# Patient Record
Sex: Female | Born: 1950 | Race: White | Hispanic: No | Marital: Married | Smoking: Former smoker
Health system: Southern US, Community
[De-identification: ages and names within clinical notes are randomized; demographics above are authoritative.]

## PROBLEM LIST (undated history)

## (undated) DIAGNOSIS — M1990 Unspecified osteoarthritis, unspecified site: Secondary | ICD-10-CM

## (undated) DIAGNOSIS — M810 Age-related osteoporosis without current pathological fracture: Secondary | ICD-10-CM

## (undated) DIAGNOSIS — K635 Polyp of colon: Secondary | ICD-10-CM

## (undated) DIAGNOSIS — I499 Cardiac arrhythmia, unspecified: Secondary | ICD-10-CM

## (undated) DIAGNOSIS — J439 Emphysema, unspecified: Secondary | ICD-10-CM

## (undated) DIAGNOSIS — H269 Unspecified cataract: Secondary | ICD-10-CM

## (undated) DIAGNOSIS — C801 Malignant (primary) neoplasm, unspecified: Secondary | ICD-10-CM

## (undated) DIAGNOSIS — C3490 Malignant neoplasm of unspecified part of unspecified bronchus or lung: Secondary | ICD-10-CM

## (undated) DIAGNOSIS — I2510 Atherosclerotic heart disease of native coronary artery without angina pectoris: Secondary | ICD-10-CM

## (undated) DIAGNOSIS — I5033 Acute on chronic diastolic (congestive) heart failure: Secondary | ICD-10-CM

## (undated) DIAGNOSIS — J449 Chronic obstructive pulmonary disease, unspecified: Secondary | ICD-10-CM

## (undated) DIAGNOSIS — I4892 Unspecified atrial flutter: Secondary | ICD-10-CM

## (undated) DIAGNOSIS — F32A Depression, unspecified: Secondary | ICD-10-CM

## (undated) DIAGNOSIS — T4145XA Adverse effect of unspecified anesthetic, initial encounter: Secondary | ICD-10-CM

## (undated) DIAGNOSIS — N179 Acute kidney failure, unspecified: Secondary | ICD-10-CM

## (undated) DIAGNOSIS — E7800 Pure hypercholesterolemia, unspecified: Secondary | ICD-10-CM

## (undated) DIAGNOSIS — F329 Major depressive disorder, single episode, unspecified: Secondary | ICD-10-CM

## (undated) DIAGNOSIS — F419 Anxiety disorder, unspecified: Secondary | ICD-10-CM

## (undated) DIAGNOSIS — D649 Anemia, unspecified: Secondary | ICD-10-CM

## (undated) DIAGNOSIS — R0902 Hypoxemia: Secondary | ICD-10-CM

## (undated) DIAGNOSIS — T8859XA Other complications of anesthesia, initial encounter: Secondary | ICD-10-CM

## (undated) DIAGNOSIS — K219 Gastro-esophageal reflux disease without esophagitis: Secondary | ICD-10-CM

## (undated) HISTORY — PX: EYE SURGERY: SHX253

## (undated) HISTORY — PX: CORONARY ANGIOPLASTY WITH STENT PLACEMENT: SHX49

## (undated) HISTORY — DX: Chronic obstructive pulmonary disease, unspecified: J44.9

## (undated) HISTORY — DX: Polyp of colon: K63.5

## (undated) HISTORY — PX: BASAL CELL CARCINOMA EXCISION: SHX1214

## (undated) HISTORY — DX: Atherosclerotic heart disease of native coronary artery without angina pectoris: I25.10

## (undated) HISTORY — DX: Hypoxemia: R09.02

## (undated) HISTORY — DX: Gastro-esophageal reflux disease without esophagitis: K21.9

## (undated) HISTORY — PX: CARDIAC CATHETERIZATION: SHX172

## (undated) HISTORY — DX: Emphysema, unspecified: J43.9

## (undated) HISTORY — PX: CHOLECYSTECTOMY: SHX55

## (undated) HISTORY — PX: APPENDECTOMY: SHX54

## (undated) HISTORY — DX: Anxiety disorder, unspecified: F41.9

## (undated) HISTORY — DX: Pure hypercholesterolemia, unspecified: E78.00

## (undated) HISTORY — DX: Depression, unspecified: F32.A

## (undated) HISTORY — DX: Unspecified cataract: H26.9

## (undated) HISTORY — DX: Malignant (primary) neoplasm, unspecified: C80.1

## (undated) HISTORY — DX: Age-related osteoporosis without current pathological fracture: M81.0

## (undated) HISTORY — DX: Major depressive disorder, single episode, unspecified: F32.9

## (undated) HISTORY — DX: Anemia, unspecified: D64.9

## (undated) HISTORY — PX: CATARACT EXTRACTION: SUR2

---
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
## 2012-09-13 DIAGNOSIS — C44310 Basal cell carcinoma of skin of unspecified parts of face: Secondary | ICD-10-CM | POA: Insufficient documentation

## 2013-12-16 ENCOUNTER — Emergency Department (HOSPITAL_COMMUNITY)
Admission: EM | Admit: 2013-12-16 | Discharge: 2013-12-16 | Disposition: A | Discharge status: Home / Self Care | Payer: Medicare HMO | Attending: Emergency Medicine | Admitting: Emergency Medicine

## 2013-12-16 ENCOUNTER — Encounter (HOSPITAL_COMMUNITY): Payer: Self-pay | Admitting: Emergency Medicine

## 2013-12-16 ENCOUNTER — Emergency Department (HOSPITAL_COMMUNITY): Payer: Medicare HMO

## 2013-12-16 DIAGNOSIS — Z95818 Presence of other cardiac implants and grafts: Secondary | ICD-10-CM | POA: Insufficient documentation

## 2013-12-16 DIAGNOSIS — R05 Cough: Secondary | ICD-10-CM

## 2013-12-16 DIAGNOSIS — R5383 Other fatigue: Secondary | ICD-10-CM

## 2013-12-16 DIAGNOSIS — R5381 Other malaise: Secondary | ICD-10-CM | POA: Insufficient documentation

## 2013-12-16 DIAGNOSIS — Z9861 Coronary angioplasty status: Secondary | ICD-10-CM | POA: Insufficient documentation

## 2013-12-16 DIAGNOSIS — F17200 Nicotine dependence, unspecified, uncomplicated: Secondary | ICD-10-CM | POA: Insufficient documentation

## 2013-12-16 DIAGNOSIS — R059 Cough, unspecified: Secondary | ICD-10-CM

## 2013-12-16 DIAGNOSIS — J441 Chronic obstructive pulmonary disease with (acute) exacerbation: Secondary | ICD-10-CM | POA: Insufficient documentation

## 2013-12-16 DIAGNOSIS — R110 Nausea: Secondary | ICD-10-CM | POA: Insufficient documentation

## 2013-12-16 DIAGNOSIS — R519 Headache, unspecified: Secondary | ICD-10-CM

## 2013-12-16 DIAGNOSIS — R51 Headache: Secondary | ICD-10-CM | POA: Insufficient documentation

## 2013-12-16 DIAGNOSIS — Z79899 Other long term (current) drug therapy: Secondary | ICD-10-CM | POA: Insufficient documentation

## 2013-12-16 MED ORDER — ONDANSETRON HCL 8 MG PO TABS: 8.0000 mg | ORAL_TABLET | Freq: Three times a day (TID) | ORAL | Status: DC | PRN

## 2013-12-16 MED ORDER — ALBUTEROL SULFATE HFA 108 (90 BASE) MCG/ACT IN AERS
2.0000 | INHALATION_SPRAY | Freq: Once | RESPIRATORY_TRACT | Status: AC
  Administered 2013-12-16: 2 via RESPIRATORY_TRACT
  Filled 2013-12-16: qty 6.7

## 2013-12-16 MED ORDER — ONDANSETRON 8 MG PO TBDP
8.0000 mg | ORAL_TABLET | Freq: Once | ORAL | Status: AC
  Administered 2013-12-16: 8 mg via ORAL
  Filled 2013-12-16: qty 1

## 2013-12-16 MED ORDER — IBUPROFEN 400 MG PO TABS
600.0000 mg | ORAL_TABLET | Freq: Once | ORAL | Status: AC
  Administered 2013-12-16: 600 mg via ORAL
  Filled 2013-12-16: qty 2

## 2013-12-16 NOTE — ED Notes (Signed)
PT C/O HA/N/COUGH X 3 DAYS.

## 2013-12-16 NOTE — ED Provider Notes (Signed)
CSN: [id number]     Arrival date & time 12/16/13  4196 History  This chart was scribed for Tonya Cable, MD by Roxan Diesel, ED scribe.  This patient was seen in room APA12/APA12 and the patient's care was started at 9:07 AM.   Chief Complaint  Patient presents with  . Headache    Patient is a 63 y.o. female presenting with headaches. The history is provided by the patient. No language interpreter was used.  Headache Pain location:  Frontal Quality: pounding. Duration:  3 days Chronicity:  New Similar to prior headaches: no   Exacerbated by: coughing. Associated symptoms: cough, fatigue and nausea   Associated symptoms: no abdominal pain, no back pain, no congestion, no diarrhea, no fever, no focal weakness, no neck pain, no sinus pressure, no sore throat, no visual change and no vomiting     HPI Comments: Tonya Bradley is a 63 y.o. female with h/o COPD and heart stent who presents to the Emergency Department complaining of a "pounding" frontal headache that began 3 days ago with associated persistent cough, nausea, and fatigue.  Pt states she gets headaches frequently but her current headache is distinct.  It is worsened by coughing.  She has attempted to treat headache with ibuprofen.  She has not used any other medications pta.  She denies abdominal pain and states "it's just real bad nausea."  Pt also notes some chronic SOB but she denies recent changes.  She denies vomiting, sore throat, sinus pressure, congestion, postnasal drip, CP, diarrhea, rash, fevers, hemoptysis, abdominal pain, visual changes, focal weakness, or pain to any other area.  She denies syncope or recent head injury.   PMH - COPD Sox hx - smoker  Past Surgical History  Procedure Laterality Date  . Cardiac catheterization    . Cholecystectomy      No family history on file.   History  Substance Use Topics  . Smoking status: Current Every Day Smoker  . Smokeless tobacco: Not on file  . Alcohol  Use: No    OB History   Grav Para Term Preterm Abortions TAB SAB Ect Mult Living                  Review of Systems  Constitutional: Positive for fatigue. Negative for fever.  HENT: Negative for congestion, sinus pressure and sore throat.   Respiratory: Positive for cough.   Gastrointestinal: Positive for nausea. Negative for vomiting, abdominal pain and diarrhea.  Musculoskeletal: Negative for back pain and neck pain.  Neurological: Positive for headaches. Negative for focal weakness.  All other systems reviewed and are negative.     Allergies  Review of patient's allergies indicates no known allergies.  Home Medications   Current Outpatient Rx  Name  Route  Sig  Dispense  Refill  . hydrochlorothiazide (HYDRODIURIL) 25 MG tablet   Oral   Take 25 mg by mouth daily.         . simvastatin (ZOCOR) 40 MG tablet   Oral   Take 40 mg by mouth daily.         Marland Kitchen ALPRAZolam (XANAX) 1 MG tablet   Oral   Take 1 tablet by mouth 3 (three) times daily.         . Vitamin D, Ergocalciferol, (DRISDOL) 50000 UNITS CAPS capsule   Oral   Take 2 capsules by mouth 2 (two) times a week.          BP 116/61  Pulse 100  Temp(Src) 99.1 F (37.3 C)  Resp 18  Ht 5\' 2"  (1.575 m)  Wt 139 lb (63.05 kg)  BMI 25.42 kg/m2  SpO2 92%   Physical Exam CONSTITUTIONAL: Well developed/well nourished HEAD: Normocephalic/atraumatic EYES: EOMI/PERRL, no nystagmus ENMT: Mucous membranes moist NECK: supple no meningeal signs, no bruits SPINE:entire spine nontender CV: S1/S2 noted, no murmurs/rubs/gallops noted LUNGS: Lungs are clear to auscultation bilaterally, no apparent distress ABDOMEN: soft, nontender, no rebound or guarding GU:no cva tenderness NEURO:Awake/alert, facies symmetric, no arm or leg drift is noted Cranial nerves 3/4/5/6/06/09/09/11/12 tested and intact Gait normal without ataxia No past pointing EXTREMITIES: pulses normal, full ROM SKIN: warm, color normal PSYCH: no  abnormalities of mood noted    ED Course  Procedures (including critical care time)  DIAGNOSTIC STUDIES: Oxygen Saturation is 92% on room air, low by my interpretation.    COORDINATION OF CARE: 9:15 AM-Discussed treatment plan which includes breathing treatment, EKG, anti-emetics and ibuprofen with pt at bedside and pt agreed to plan.    9:54 AM Pt well appearing, no distress Reports cough and associated HA She has no focal neuro deficits No signs of meningitis No active CP.  She reports chronic SOB due to COPD but not worsened today She has dry cough while I was in room.  Suspect viral syndrome She was mostly concerned with her nausea 10:00 AM Repeat exam reveals crackles right base but otherwise no distress, well appearing Will get CXR Pt does not want labs done as she just had them last week by PCP and were normal 10:30 AM CXR shows no acute process Her pulse ox is 93-94%, she reports she is on home O2 at night I doubt ACS/PE at this time I doubt an acute neurologic process at this time   Labs Review Labs Reviewed - No data to display   Imaging Review Dg Chest 2 View  12/16/2013   CLINICAL DATA:  Cough, congestion  EXAM: CHEST  2 VIEW  COMPARISON:  None.  FINDINGS: Cardiac and mediastinal contours are within normal limits. Atherosclerotic calcification noted in the transverse aorta. The patient is slightly rotated to the right. The manubrium cast a shadow over the medial right upper lung in the region of the first rib costo manubrial junction. Background bronchitic changes and bilateral upper lung emphysema. Mildly prominent interstitial markings in the bases. The lungs are hyperexpanded No acute osseous abnormality. No suspicious pulmonary nodule. A nodular opacity in the right mid lung interposed between the seventh and eighth ribs is favored to reflect a vascular shadow.  IMPRESSION: 1. COPD/emphysema. 2. Aortic atherosclerosis.   Electronically Signed   By: Jacqulynn Cadet M.D.   On: 12/16/2013 10:23    EKG Interpretation    Date/Time:  Thursday December 16 2013 09:38:16 EST Ventricular Rate:  96 PR Interval:  192 QRS Duration: 92 QT Interval:  366 QTC Calculation: 462 R Axis:   -86 Text Interpretation:  Normal sinus rhythm Left axis deviation Incomplete right bundle branch block Nonspecific ST and T wave abnormality Abnormal ECG No previous ECGs available Confirmed by Christy Gentles  MD, Riata Ikeda ([id number]) on 12/16/2013 9:50:14 AM            MDM  No diagnosis found. Nursing notes including past medical history and social history reviewed and considered in documentation xrays reviewed and considered     I personally performed the services described in this documentation, which was scribed in my presence. The recorded information has been reviewed and is accurate.  Tonya Cable, MD 12/16/13 1031

## 2013-12-16 NOTE — Discharge Instructions (Signed)

## 2014-06-07 ENCOUNTER — Ambulatory Visit (INDEPENDENT_AMBULATORY_CARE_PROVIDER_SITE_OTHER): Payer: Medicare HMO | Admitting: Gastroenterology

## 2014-06-07 ENCOUNTER — Encounter (INDEPENDENT_AMBULATORY_CARE_PROVIDER_SITE_OTHER): Payer: Self-pay

## 2014-06-07 ENCOUNTER — Encounter: Payer: Self-pay | Admitting: Gastroenterology

## 2014-06-07 VITALS — BP 118/72 | HR 90 | Temp 98.0°F | Resp 18 | Ht 62.0 in | Wt 125.0 lb

## 2014-06-07 DIAGNOSIS — R195 Other fecal abnormalities: Secondary | ICD-10-CM

## 2014-06-07 MED ORDER — PEG 3350-KCL-NA BICARB-NACL 420 G PO SOLR: 4000.0000 mL | ORAL | Status: DC

## 2014-06-07 NOTE — Patient Instructions (Signed)
Start taking a probiotic daily. We have provided Align samples.  Please complete the stool samples.  We have scheduled you for a colonoscopy with Dr. Gala Romney in the near future!

## 2014-06-07 NOTE — Progress Notes (Signed)
Primary Care Physician:  Jearld Lesch, DO Primary Gastroenterologist:  Dr. Gala Romney  Chief Complaint  Patient presents with  . Establish Care    HPI:   Tonya Bradley presents today at the request of Carolee Rota, NP/Dr/ Jearld Lesch secondary to diarrhea. Last colonoscopy in 2002 at [facility]. Reported polyps. Operative notes not available at time of appointment. Has hadBM 4 times this morning but not diarrhea. Postprandial urgency occasionally. Onset of loose stools about 2-3 months ago. Prior to this, dealt with constipation and actually had to take a laxative to go. Has been going 7-8 times per day. Used to be a foul odor but has improved some this week. Feels better this week. No rectal bleeding. Stomach cramps. Since last 07-23-2023 lost from 165 to 125. Celesta Gentile passed away last 2023-07-23 from a brain tumor. Doesn't cook, thinks may be related to weight loss.   Antibiotic exposure about 4-5 months ago. Well water. No sick contacts.   Past Medical History  Diagnosis Date  . Colon polyp   . Anxiety   . Hypercholesterolemia   . CAD (coronary artery disease)   . COPD (chronic obstructive pulmonary disease)     Past Surgical History  Procedure Laterality Date  . Cardiac catheterization    . Cholecystectomy    . Basal cell carcinoma excision    . Coronary angioplasty with stent placement      Current Outpatient Prescriptions  Medication Sig Dispense Refill  . ALPRAZolam (XANAX) 1 MG tablet Take 1 tablet by mouth 3 (three) times daily.      . hydrochlorothiazide (HYDRODIURIL) 25 MG tablet Take 25 mg by mouth daily.      Marland Kitchen ibuprofen (ADVIL,MOTRIN) 200 MG tablet Take 400-600 mg by mouth every 6 (six) hours as needed for fever, headache or moderate pain.      . simvastatin (ZOCOR) 40 MG tablet Take 40 mg by mouth daily.      . Vitamin D, Ergocalciferol, (DRISDOL) 50000 UNITS CAPS capsule Take 2 capsules by mouth 2 (two) times a week. Takes on Tuesdays and Fridays.       No current  facility-administered medications for this visit.    Allergies as of 06/07/2014  . (No Known Allergies)    Family History  Problem Relation Age of Onset  . Colon cancer Neg Hx   . Breast cancer Sister     History   Social History  . Marital Status: Widowed    Spouse Name: N/A    Number of Children: N/A  . Years of Education: N/A   Occupational History  . Not on file.   Social History Main Topics  . Smoking status: Current Every Day Smoker -- 2.00 packs/day    Types: Cigarettes  . Smokeless tobacco: Not on file  . Alcohol Use: No  . Drug Use: No  . Sexual Activity: Not on file   Other Topics Concern  . Not on file   Social History Narrative  . No narrative on file    Review of Systems: Gen: see HPI CV: Denies chest pain, heart palpitations, peripheral edema, syncope.  Resp: +cough, wheezing GI: see HPI GU : Denies urinary burning, urinary frequency, urinary hesitancy MS: neck pain "catches" when turning a certain way Derm: Denies rash, itching, dry skin Psych: +depression, anxiety Heme: Denies bruising, bleeding, and enlarged lymph nodes.  Physical Exam: BP 118/72  Pulse 90  Temp(Src) 98 F (36.7 C) (Oral)  Resp 18  Ht 5\' 2"  (  1.575 m)  Wt 125 lb (56.7 kg)  BMI 22.86 kg/m2 General:   Alert and oriented. Pleasant and cooperative. Well-nourished and well-developed.  Head:  Normocephalic and atraumatic. Eyes:  Without icterus, sclera clear and conjunctiva pink.  Ears:  Normal auditory acuity. Nose:  No deformity, discharge,  or lesions. Mouth:  No deformity or lesions, oral mucosa pink.  Lungs:  Mild anterior wheeze  Heart:  S1, S2 present without murmurs appreciated.  Abdomen:  +BS, soft, non-tender and non-distended. No HSM noted. No guarding or rebound. No masses appreciated.  Rectal:  Deferred  Msk:  Symmetrical without gross deformities. Normal posture. Neurologic:  Alert and  oriented x4;  grossly normal neurologically. Skin:  Intact without  significant lesions or rashes. Cervical Nodes:  No significant cervical adenopathy. Psych:  Alert and cooperative. Normal mood and affect.

## 2014-06-08 DIAGNOSIS — R195 Other fecal abnormalities: Secondary | ICD-10-CM | POA: Insufficient documentation

## 2014-06-08 NOTE — Assessment & Plan Note (Signed)
63 year old female with new onset loose stool for several months, markedly changed from her baseline of constipation, with last colonoscopy in 2002 at an outside facility. Possibility of polyps noted per patient; op notes not available. Exposure to antibiotics noted several months prior. Unable to rule out Cdiff. Weight loss likely multifactorial with decreased eating and grieving process due to death of husband and then fiancee.   Check stool studies now Probiotic daily Proceed with TCS with Dr. Gala Romney in near future: the risks, benefits, and alternatives have been discussed with the patient in detail. The patient states understanding and desires to proceed.

## 2014-06-09 LAB — GIARDIA ANTIGEN: Giardia Screen (EIA): NEGATIVE

## 2014-06-09 LAB — CLOSTRIDIUM DIFFICILE BY PCR: Toxigenic C. Difficile by PCR: NOT DETECTED

## 2014-06-12 LAB — STOOL CULTURE

## 2014-06-20 ENCOUNTER — Encounter (HOSPITAL_COMMUNITY): Payer: Self-pay | Admitting: Pharmacy Technician

## 2014-06-21 NOTE — Progress Notes (Signed)
Quick Note:  Negative stool studies. Proceed with colonoscopy as planned. ______

## 2014-06-23 ENCOUNTER — Ambulatory Visit (HOSPITAL_COMMUNITY)
Admission: RE | Admit: 2014-06-23 | Discharge: 2014-06-23 | Disposition: A | Discharge status: Home / Self Care | Payer: Medicare HMO | Source: Ambulatory Visit | Attending: Internal Medicine | Admitting: Internal Medicine

## 2014-06-23 ENCOUNTER — Encounter (HOSPITAL_COMMUNITY): Payer: Self-pay

## 2014-06-23 ENCOUNTER — Encounter (HOSPITAL_COMMUNITY)
Admission: RE | Disposition: A | Discharge status: Home / Self Care | Payer: Self-pay | Source: Ambulatory Visit | Attending: Internal Medicine

## 2014-06-23 DIAGNOSIS — J4489 Other specified chronic obstructive pulmonary disease: Secondary | ICD-10-CM | POA: Insufficient documentation

## 2014-06-23 DIAGNOSIS — R197 Diarrhea, unspecified: Secondary | ICD-10-CM | POA: Insufficient documentation

## 2014-06-23 DIAGNOSIS — Z803 Family history of malignant neoplasm of breast: Secondary | ICD-10-CM | POA: Insufficient documentation

## 2014-06-23 DIAGNOSIS — D126 Benign neoplasm of colon, unspecified: Secondary | ICD-10-CM | POA: Insufficient documentation

## 2014-06-23 DIAGNOSIS — K5289 Other specified noninfective gastroenteritis and colitis: Secondary | ICD-10-CM | POA: Diagnosis not present

## 2014-06-23 DIAGNOSIS — F411 Generalized anxiety disorder: Secondary | ICD-10-CM | POA: Diagnosis not present

## 2014-06-23 DIAGNOSIS — K620 Anal polyp: Secondary | ICD-10-CM | POA: Insufficient documentation

## 2014-06-23 DIAGNOSIS — K5730 Diverticulosis of large intestine without perforation or abscess without bleeding: Secondary | ICD-10-CM | POA: Insufficient documentation

## 2014-06-23 DIAGNOSIS — J449 Chronic obstructive pulmonary disease, unspecified: Secondary | ICD-10-CM | POA: Diagnosis not present

## 2014-06-23 DIAGNOSIS — Z79899 Other long term (current) drug therapy: Secondary | ICD-10-CM | POA: Diagnosis not present

## 2014-06-23 DIAGNOSIS — E7800 Pure hypercholesterolemia, unspecified: Secondary | ICD-10-CM | POA: Diagnosis not present

## 2014-06-23 DIAGNOSIS — R195 Other fecal abnormalities: Secondary | ICD-10-CM

## 2014-06-23 DIAGNOSIS — F17200 Nicotine dependence, unspecified, uncomplicated: Secondary | ICD-10-CM | POA: Insufficient documentation

## 2014-06-23 DIAGNOSIS — K621 Rectal polyp: Secondary | ICD-10-CM

## 2014-06-23 DIAGNOSIS — I2510 Atherosclerotic heart disease of native coronary artery without angina pectoris: Secondary | ICD-10-CM | POA: Diagnosis not present

## 2014-06-23 HISTORY — PX: COLONOSCOPY: SHX5424

## 2014-06-23 SURGERY — COLONOSCOPY
Anesthesia: Moderate Sedation

## 2014-06-23 MED ORDER — ONDANSETRON HCL 4 MG/2ML IJ SOLN
INTRAMUSCULAR | Status: AC
  Filled 2014-06-23: qty 2

## 2014-06-23 MED ORDER — MEPERIDINE HCL 100 MG/ML IJ SOLN
INTRAMUSCULAR | Status: DC | PRN
  Administered 2014-06-23 (×2): 50 mg via INTRAVENOUS
  Administered 2014-06-23 (×2): 25 mg via INTRAVENOUS

## 2014-06-23 MED ORDER — ONDANSETRON HCL 4 MG/2ML IJ SOLN
INTRAMUSCULAR | Status: DC | PRN
  Administered 2014-06-23: 4 mg via INTRAVENOUS

## 2014-06-23 MED ORDER — MIDAZOLAM HCL 5 MG/5ML IJ SOLN
INTRAMUSCULAR | Status: AC
  Filled 2014-06-23: qty 10

## 2014-06-23 MED ORDER — SODIUM CHLORIDE 0.9 % IV SOLN
INTRAVENOUS | Status: DC
  Administered 2014-06-23: 11:00:00 via INTRAVENOUS

## 2014-06-23 MED ORDER — MEPERIDINE HCL 100 MG/ML IJ SOLN
INTRAMUSCULAR | Status: AC
  Filled 2014-06-23: qty 2

## 2014-06-23 MED ORDER — MIDAZOLAM HCL 5 MG/5ML IJ SOLN
INTRAMUSCULAR | Status: DC | PRN
  Administered 2014-06-23: 1 mg via INTRAVENOUS
  Administered 2014-06-23 (×3): 2 mg via INTRAVENOUS
  Administered 2014-06-23: 1 mg via INTRAVENOUS

## 2014-06-23 MED ORDER — STERILE WATER FOR IRRIGATION IR SOLN
Status: DC | PRN
  Administered 2014-06-23: 11:00:00

## 2014-06-23 NOTE — Op Note (Signed)
[facility] [address] [place], [address]   COLONOSCOPY PROCEDURE REPORT  PATIENT: Tonya Bradley, Tonya Bradley  MR#:         [id number] BIRTHDATE: 1951-10-19 , 73  yrs. old GENDER: Female ENDOSCOPIST: R.  Garfield Cornea, MD FACP [facility] REFERRED BY:     Jearld Lesch PROCEDURE DATE:  06/23/2014 PROCEDURE:     Ileocolonoscopy with segmental biopsy  INDICATIONS: chronic diarrhea  INFORMED CONSENT:  The risks, benefits, alternatives and imponderables including but not limited to bleeding, perforation as well as the possibility of a missed lesion have been reviewed.  The potential for biopsy, lesion removal, etc. have also been discussed.  Questions have been answered.  All parties agreeable. Please see the history and physical in the medical record for more information.  MEDICATIONS: Versed 8 mg IV and Demerol 150 mg IV in divided doses. Zofran 4 mg IV.  DESCRIPTION OF PROCEDURE:  After a digital rectal exam was performed, the EC-[id number] ([id number])  colonoscope was advanced from the anus through the rectum and colon to the area of the cecum, ileocecal valve and appendiceal orifice.  The cecum was deeply intubated.  These structures were well-seen and photographed for the record.  From the level of the cecum and ileocecal valve, the scope was slowly and cautiously withdrawn.  The mucosal surfaces were carefully surveyed utilizing scope tip deflection to facilitate fold flattening as needed.  The scope was pulled down into the rectum where a thorough examination including retroflexion was performed.    FINDINGS:  Adequate preparation.  (1) diminutive polyp in the distal rectum. Some furrowing of the rectal mucosa present.  These sutle changes extended up into the sigmoid segment and taper off more proximally to the cecum. Patient scattered pancolonic diverticula ;the patient (1) diminutive polyp in the descending segment; otherwise, the remainder of colonic mucosa appeared  normal. The distal 5 cm of terminal ileal mucosa also appeared normal.  THERAPEUTIC / DIAGNOSTIC MANEUVERS PERFORMED: Segmental biopsies of the ascending and descending/sigmoid segments taken for histologic study. The rectal and descending colon polyps removed: Biopsy technique.  COMPLICATIONS: none  CECAL WITHDRAWAL TIME:  20 minutes  IMPRESSION:  Rectal and colonic polyps-removed as described above. Pancolonic diverticulosis. status post sacral biopsy.  RECOMMENDATIONS: Followup on pathology.   _______________________________ eSigned:  R. Garfield Cornea, MD FACP [facility] 06/23/2014 12:00 PM   CC:    PATIENT NAME:  Tonya Bradley, Tonya Bradley MR#: [id number]

## 2014-06-23 NOTE — H&P (View-Only) (Signed)
Primary Care Physician:  Jearld Lesch, DO Primary Gastroenterologist:  Dr. Gala Romney  Chief Complaint  Patient presents with  . Establish Care    HPI:   Tonya Bradley presents today at the request of Carolee Rota, NP/Dr/ Jearld Lesch secondary to diarrhea. Last colonoscopy in 2002 at [place]. Reported polyps. Operative notes not available at time of appointment. Has hadBM 4 times this morning but not diarrhea. Postprandial urgency occasionally. Onset of loose stools about 2-3 months ago. Prior to this, dealt with constipation and actually had to take a laxative to go. Has been going 7-8 times per day. Used to be a foul odor but has improved some this week. Feels better this week. No rectal bleeding. Stomach cramps. Since last 08/06/23 lost from 165 to 125. Celesta Gentile passed away last 08/06/2023 from a brain tumor. Doesn't cook, thinks may be related to weight loss.   Antibiotic exposure about 4-5 months ago. Well water. No sick contacts.   Past Medical History  Diagnosis Date  . Colon polyp   . Anxiety   . Hypercholesterolemia   . CAD (coronary artery disease)   . COPD (chronic obstructive pulmonary disease)     Past Surgical History  Procedure Laterality Date  . Cardiac catheterization    . Cholecystectomy    . Basal cell carcinoma excision    . Coronary angioplasty with stent placement      Current Outpatient Prescriptions  Medication Sig Dispense Refill  . ALPRAZolam (XANAX) 1 MG tablet Take 1 tablet by mouth 3 (three) times daily.      . hydrochlorothiazide (HYDRODIURIL) 25 MG tablet Take 25 mg by mouth daily.      Marland Kitchen ibuprofen (ADVIL,MOTRIN) 200 MG tablet Take 400-600 mg by mouth every 6 (six) hours as needed for fever, headache or moderate pain.      . simvastatin (ZOCOR) 40 MG tablet Take 40 mg by mouth daily.      . Vitamin D, Ergocalciferol, (DRISDOL) 50000 UNITS CAPS capsule Take 2 capsules by mouth 2 (two) times a week. Takes on Tuesdays and Fridays.       No current  facility-administered medications for this visit.    Allergies as of 06/07/2014  . (No Known Allergies)    Family History  Problem Relation Age of Onset  . Colon cancer Neg Hx   . Breast cancer Sister     History   Social History  . Marital Status: Widowed    Spouse Name: N/A    Number of Children: N/A  . Years of Education: N/A   Occupational History  . Not on file.   Social History Main Topics  . Smoking status: Current Every Day Smoker -- 2.00 packs/day    Types: Cigarettes  . Smokeless tobacco: Not on file  . Alcohol Use: No  . Drug Use: No  . Sexual Activity: Not on file   Other Topics Concern  . Not on file   Social History Narrative  . No narrative on file    Review of Systems: Gen: see HPI CV: Denies chest pain, heart palpitations, peripheral edema, syncope.  Resp: +cough, wheezing GI: see HPI GU : Denies urinary burning, urinary frequency, urinary hesitancy MS: neck pain "catches" when turning a certain way Derm: Denies rash, itching, dry skin Psych: +depression, anxiety Heme: Denies bruising, bleeding, and enlarged lymph nodes.  Physical Exam: BP 118/72  Pulse 90  Temp(Src) 98 F (36.7 C) (Oral)  Resp 18  Ht 5\' 2"  (  1.575 m)  Wt 125 lb (56.7 kg)  BMI 22.86 kg/m2 General:   Alert and oriented. Pleasant and cooperative. Well-nourished and well-developed.  Head:  Normocephalic and atraumatic. Eyes:  Without icterus, sclera clear and conjunctiva pink.  Ears:  Normal auditory acuity. Nose:  No deformity, discharge,  or lesions. Mouth:  No deformity or lesions, oral mucosa pink.  Lungs:  Mild anterior wheeze  Heart:  S1, S2 present without murmurs appreciated.  Abdomen:  +BS, soft, non-tender and non-distended. No HSM noted. No guarding or rebound. No masses appreciated.  Rectal:  Deferred  Msk:  Symmetrical without gross deformities. Normal posture. Neurologic:  Alert and  oriented x4;  grossly normal neurologically. Skin:  Intact without  significant lesions or rashes. Cervical Nodes:  No significant cervical adenopathy. Psych:  Alert and cooperative. Normal mood and affect.

## 2014-06-23 NOTE — Interval H&P Note (Signed)
History and Physical Interval Note:  06/23/2014 11:13 AM  Tonya Bradley  has presented today for surgery, with the diagnosis of DIARRHEA  The various methods of treatment have been discussed with the patient and family. After consideration of risks, benefits and other options for treatment, the patient has consented to  Procedure(s) with comments: COLONOSCOPY (N/A) - 10:00-moved to 1115 Tonya Bradley to notify pt as a surgical intervention .  The patient's history has been reviewed, patient examined, no change in status, stable for surgery.  I have reviewed the patient's chart and labs.  Questions were answered to the patient's satisfaction.     Tonya Bradley  No change. Colonoscopy per plan. The risks, benefits, limitations, alternatives and imponderables have been reviewed with the patient. Questions have been answered. All parties are agreeable.

## 2014-06-23 NOTE — Discharge Instructions (Addendum)
Colonoscopy Discharge Instructions  Read the instructions outlined below and refer to this sheet in the next few weeks. These discharge instructions provide you with general information on caring for yourself after you leave the hospital. Your doctor may also give you specific instructions. While your treatment has been planned according to the most current medical practices available, unavoidable complications occasionally occur. If you have any problems or questions after discharge, call Dr. Gala Romney at [phone]. ACTIVITY  You may resume your regular activity, but move at a slower pace for the next 24 hours.   Take frequent rest periods for the next 24 hours.   Walking will help get rid of the air and reduce the bloated feeling in your belly (abdomen).   No driving for 24 hours (because of the medicine (anesthesia) used during the test).    Do not sign any important legal documents or operate any machinery for 24 hours (because of the anesthesia used during the test).  NUTRITION  Drink plenty of fluids.   You may resume your normal diet as instructed by your doctor.   Begin with a light meal and progress to your normal diet. Heavy or fried foods are harder to digest and may make you feel sick to your stomach (nauseated).   Avoid alcoholic beverages for 24 hours or as instructed.  MEDICATIONS  You may resume your normal medications unless your doctor tells you otherwise.  WHAT YOU CAN EXPECT TODAY  Some feelings of bloating in the abdomen.   Passage of more gas than usual.   Spotting of blood in your stool or on the toilet paper.  IF YOU HAD POLYPS REMOVED DURING THE COLONOSCOPY:  No aspirin products for 7 days or as instructed.   No alcohol for 7 days or as instructed.   Eat a soft diet for the next 24 hours.  FINDING OUT THE RESULTS OF YOUR TEST Not all test results are available during your visit. If your test results are not back during the visit, make an appointment  with your caregiver to find out the results. Do not assume everything is normal if you have not heard from your caregiver or the medical facility. It is important for you to follow up on all of your test results.  SEEK IMMEDIATE MEDICAL ATTENTION IF:  You have more than a spotting of blood in your stool.   Your belly is swollen (abdominal distention).   You are nauseated or vomiting.   You have a temperature over 101.   You have abdominal pain or discomfort that is severe or gets worse throughout the day.    Diverticulosis and polyp information provided  Further recommendations to follow pending review of pathology  Diverticulosis Diverticulosis is the condition that develops when small pouches (diverticula) form in the wall of your colon. Your colon, or large intestine, is where water is absorbed and stool is formed. The pouches form when the inside layer of your colon pushes through weak spots in the outer layers of your colon. CAUSES  No one knows exactly what causes diverticulosis. RISK FACTORS  Being older than 33. Your risk for this condition increases with age. Diverticulosis is rare in people younger than 40 years. By age 41, almost everyone has it.  Eating a low-fiber diet.  Being frequently constipated.  Being overweight.  Not getting enough exercise.  Smoking.  Taking over-the-counter pain medicines, like aspirin and ibuprofen. SYMPTOMS  Most people with diverticulosis do not have symptoms. DIAGNOSIS  Because diverticulosis  often has no symptoms, health care providers often discover the condition during an exam for other colon problems. In many cases, a health care provider will diagnose diverticulosis while using a flexible scope to examine the colon (colonoscopy). TREATMENT  If you have never developed an infection related to diverticulosis, you may not need treatment. If you have had an infection before, treatment may include:  Eating more fruits, vegetables,  and grains.  Taking a fiber supplement.  Taking a live bacteria supplement (probiotic).  Taking medicine to relax your colon. HOME CARE INSTRUCTIONS   Drink at least 6-8 glasses of water each day to prevent constipation.  Try not to strain when you have a bowel movement.  Keep all follow-up appointments. If you have had an infection before:  Increase the fiber in your diet as directed by your health care provider or dietitian.  Take a dietary fiber supplement if your health care provider approves.  Only take medicines as directed by your health care provider. SEEK MEDICAL CARE IF:   You have abdominal pain.  You have bloating.  You have cramps.  You have not gone to the bathroom in 3 days. SEEK IMMEDIATE MEDICAL CARE IF:   Your pain gets worse.  Yourbloating becomes very bad.  You have a fever or chills, and your symptoms suddenly get worse.  You begin vomiting.  You have bowel movements that are bloody or black. MAKE SURE YOU:  Understand these instructions.  Will watch your condition.  Will get help right away if you are not doing well or get worse. Document Released: 08/15/2004 Document Revised: 11/23/2013 Document Reviewed: 10/13/2013 [facility] Patient Information 2015 [place], [place]. This information is not intended to replace advice given to you by your health care provider. Make sure you discuss any questions you have with your health care provider.  Colon Polyps Polyps are lumps of extra tissue growing inside the body. Polyps can grow in the large intestine (colon). Most colon polyps are noncancerous (benign). However, some colon polyps can become cancerous over time. Polyps that are larger than a pea may be harmful. To be safe, caregivers remove and test all polyps. CAUSES  Polyps form when mutations in the genes cause your cells to grow and divide even though no more tissue is needed. RISK FACTORS There are a number of risk factors that can increase  your chances of getting colon polyps. They include:  Being older than 50 years.  Family history of colon polyps or colon cancer.  Long-term colon diseases, such as colitis or Crohn disease.  Being overweight.  Smoking.  Being inactive.  Drinking too much alcohol. SYMPTOMS  Most small polyps do not cause symptoms. If symptoms are present, they may include:  Blood in the stool. The stool may look dark red or black.  Constipation or diarrhea that lasts longer than 1 week. DIAGNOSIS People often do not know they have polyps until their caregiver finds them during a regular checkup. Your caregiver can use 4 tests to check for polyps:  Digital rectal exam. The caregiver wears gloves and feels inside the rectum. This test would find polyps only in the rectum.  Barium enema. The caregiver puts a liquid called barium into your rectum before taking X-rays of your colon. Barium makes your colon look white. Polyps are dark, so they are easy to see in the X-ray pictures.  Sigmoidoscopy. A thin, flexible tube (sigmoidoscope) is placed into your rectum. The sigmoidoscope has a light and tiny camera in it.  The caregiver uses the sigmoidoscope to look at the last third of your colon.  Colonoscopy. This test is like sigmoidoscopy, but the caregiver looks at the entire colon. This is the most common method for finding and removing polyps. TREATMENT  Any polyps will be removed during a sigmoidoscopy or colonoscopy. The polyps are then tested for cancer. PREVENTION  To help lower your risk of getting more colon polyps:  Eat plenty of fruits and vegetables. Avoid eating fatty foods.  Do not smoke.  Avoid drinking alcohol.  Exercise every day.  Lose weight if recommended by your caregiver.  Eat plenty of calcium and folate. Foods that are rich in calcium include milk, cheese, and broccoli. Foods that are rich in folate include chickpeas, kidney beans, and spinach. HOME CARE INSTRUCTIONS Keep  all follow-up appointments as directed by your caregiver. You may need periodic exams to check for polyps. SEEK MEDICAL CARE IF: You notice bleeding during a bowel movement. Document Released: 08/14/2004 Document Revised: 02/10/2012 Document Reviewed: 01/28/2012 [facility] Patient Information 2015 [place], [place]. This information is not intended to replace advice given to you by your health care provider. Make sure you discuss any questions you have with your health care provider.

## 2014-06-24 ENCOUNTER — Encounter: Payer: Self-pay | Admitting: Internal Medicine

## 2014-06-24 ENCOUNTER — Encounter (HOSPITAL_COMMUNITY): Payer: Self-pay | Admitting: Internal Medicine

## 2014-06-28 ENCOUNTER — Telehealth: Payer: Self-pay

## 2014-06-28 NOTE — Telephone Encounter (Signed)
Letter from: Daneil Dolin    Reason for Letter: Results Review    Send letter to patient.  Send copy of letter with path to referring provider and PCP.   Pt needs entocort 9 mg daily x 6 weeks w OV w AS in 6 weeks

## 2014-06-30 MED ORDER — BUDESONIDE 3 MG PO CP24: 9.0000 mg | ORAL_CAPSULE | Freq: Every day | ORAL | Status: DC

## 2014-06-30 NOTE — Telephone Encounter (Signed)
Pt is aware, rx sent to the pharmacy. Per AS ok to send in 30 days worth with 1 refill.  Manuela Schwartz, please schedule ov in 6 weeks.

## 2014-07-01 NOTE — Telephone Encounter (Signed)
We could try Uceris 9mg  daily, #30, 1 refill.   If not covered or too expensive, then mesalamine 2.4g daily (Asacol or HD, Delzicol, Lialda) whatever is on formulary.

## 2014-07-01 NOTE — Telephone Encounter (Signed)
Pt called- her copay for the entocort is $432.00. I tried to check her formulary online and could not find anything, also, I called [employer] and was transferred to 4 different departments and after being on the phone for over 15 minutes, we were disconnected. I am not sure what is on her formulary.  Magda Paganini, is there anything else we can send in for this pt?

## 2014-07-01 NOTE — Telephone Encounter (Signed)
Spoke with Tripp at [facility]. Uceris is $1500.00. Lialda is covered and $45 a month. I spoke with pt and she is aware.

## 2014-07-04 ENCOUNTER — Encounter: Payer: Self-pay | Admitting: Internal Medicine

## 2014-07-04 NOTE — Telephone Encounter (Signed)
Pt is aware of OV on 9/14 at 1030 and appt card mailed

## 2014-08-15 ENCOUNTER — Encounter: Payer: Self-pay | Admitting: Gastroenterology

## 2014-08-15 ENCOUNTER — Ambulatory Visit (INDEPENDENT_AMBULATORY_CARE_PROVIDER_SITE_OTHER): Payer: Medicare HMO | Admitting: Gastroenterology

## 2014-08-15 VITALS — BP 106/70 | HR 92 | Temp 97.6°F | Ht 62.0 in | Wt 119.0 lb

## 2014-08-15 DIAGNOSIS — K52839 Microscopic colitis, unspecified: Secondary | ICD-10-CM

## 2014-08-15 DIAGNOSIS — R634 Abnormal weight loss: Secondary | ICD-10-CM

## 2014-08-15 DIAGNOSIS — K5289 Other specified noninfective gastroenteritis and colitis: Secondary | ICD-10-CM

## 2014-08-15 MED ORDER — BISMUTH SUBSALICYLATE 262 MG PO CHEW: 786.0000 mg | CHEWABLE_TABLET | Freq: Three times a day (TID) | ORAL | Status: DC

## 2014-08-15 NOTE — Progress Notes (Signed)
Referring Provider: Jearld Lesch, DO Primary Care Physician:  Jearld Lesch, DO Primary GI: Dr. Gala Romney   Chief Complaint  Patient presents with  . Follow-up  . Diarrhea    HPI:   Tonya Bradley presents today in follow-up after colonoscopy in July 2015 revealing lymphocytic colitis. Work-up prior to colonoscopy with negative stool studies. She was unable to afford Entocort or Uceris. Therefore, she was prescribed Lialda. She completed this and actually ran out several days ago. However, she noted no improvement in her symptoms. Continues with loose stools 4-5 times per day. Postprandial urgency. Occasional cramping. Good appetite but continues to lose weight. Last August reports weighing around 165. Presentation to [place] in July 2015 was 125. Today 119. UNINTENTIONAL WEIGHT LOSS. Now reporting clay-colored stool. Significant stress.    Past Medical History  Diagnosis Date  . Colon polyp   . Anxiety   . Hypercholesterolemia   . CAD (coronary artery disease)   . COPD (chronic obstructive pulmonary disease)     Past Surgical History  Procedure Laterality Date  . Cardiac catheterization    . Cholecystectomy    . Basal cell carcinoma excision    . Coronary angioplasty with stent placement    . Colonoscopy N/A 06/23/2014    Dr. Rourk:Rectal and colonic polyps-removed, Pancolonic diverticulosis. lymphocytic colitis    Current Outpatient Prescriptions  Medication Sig Dispense Refill  . ALPRAZolam (XANAX) 1 MG tablet Take 1 tablet by mouth 3 (three) times daily.      . cholecalciferol (VITAMIN D) 1000 UNITS tablet Take 1,000 Units by mouth daily.      . hydrochlorothiazide (HYDRODIURIL) 25 MG tablet Take 25 mg by mouth daily as needed (swelling).       Marland Kitchen ibuprofen (ADVIL,MOTRIN) 200 MG tablet Take 400-600 mg by mouth every 6 (six) hours as needed for fever, headache or moderate pain.      . mirtazapine (REMERON) 15 MG tablet Take 7.5 mg by mouth at bedtime.      Marland Kitchen rOPINIRole  (REQUIP) 0.25 MG tablet Take 1 tablet by mouth at bedtime as needed (restless legs).       . simvastatin (ZOCOR) 40 MG tablet Take 40 mg by mouth daily.      Marland Kitchen bismuth subsalicylate (PEPTO-BISMOL) 262 MG chewable tablet Chew 3 tablets (786 mg total) by mouth 3 (three) times daily.  270 tablet  2   No current facility-administered medications for this visit.    Allergies as of 08/15/2014  . (No Known Allergies)    Family History  Problem Relation Age of Onset  . Colon cancer Neg Hx   . Breast cancer Sister     History   Social History  . Marital Status: Widowed    Spouse Name: N/A    Number of Children: N/A  . Years of Education: N/A   Social History Main Topics  . Smoking status: Current Every Day Smoker -- 2.00 packs/day    Types: Cigarettes  . Smokeless tobacco: None  . Alcohol Use: No  . Drug Use: No  . Sexual Activity: None   Other Topics Concern  . None   Social History Narrative  . None    Review of Systems: As mentioned in HPI  Physical Exam: BP 106/70  Pulse 92  Temp(Src) 97.6 F (36.4 C) (Oral)  Ht 5\' 2"  (1.575 m)  Wt 119 lb (53.978 kg)  BMI 21.76 kg/m2 General:   Alert and oriented. No distress noted. Pleasant and cooperative.  Head:  Normocephalic and atraumatic. Eyes:  Conjuctiva clear without scleral icterus. Abdomen:  +BS, soft, non-tender and non-distended. No rebound or guarding. No HSM or masses noted. Msk:  Symmetrical without gross deformities. Normal posture. Extremities:  Without edema. Neurologic:  Alert and  oriented x4;  grossly normal neurologically. Skin:  Intact without significant lesions or rashes. Psych:  Alert and cooperative. Normal mood and affect.

## 2014-08-15 NOTE — Patient Instructions (Signed)
Take 3 pepto bismol tablets three times a day. Please call me by the end of the week and let me know how you are doing.  I've ordered a CT scan to be done for further evaluation of weight loss. I am also requesting any outside labs from your primary care doctor.   We will be in touch shortly after review of the CT!

## 2014-08-16 ENCOUNTER — Telehealth: Payer: Self-pay | Admitting: Internal Medicine

## 2014-08-16 NOTE — Telephone Encounter (Signed)
PATIENT CALLED STATING SHE WAS SEEN YESTERDAY AND WAS SUPPOSED TO HAVE A PRESCRIPTION OF PEPTO BISMOL CALLED IN AND THEY NEVER RECEIVED IT.  DO YOU WANT HER TO JUST TRY SOMETHING ELSE? PLEASE CALL HER AT [phone]

## 2014-08-16 NOTE — Progress Notes (Signed)
Pt is aware of her CT on  Oct 17 @ 9:30.

## 2014-08-16 NOTE — Telephone Encounter (Signed)
Talked to the pharmacy, her insurance doesn't cover things that are otc. Pt is aware to get it otc and the pharmacy said they would help her and make sure she bought the correct thing.

## 2014-08-17 ENCOUNTER — Encounter: Payer: Self-pay | Admitting: Gastroenterology

## 2014-08-17 NOTE — Assessment & Plan Note (Signed)
Biopsy proven lymphocytic colitis; unable to afford Entocort or Uceris. No improvement with mesalamine X 6 weeks. Persistent loose stool, weight loss. I am not convinced her symptoms are entirely related to microscopic colitis. Will trial pepto 3 tablets TID, Imodium prn. May have underlying pancreatic insufficiency. Her persistent weight loss is very concerning to me; she also notes clay-colored stool. Will proceed with a CT scan as soon as possible and retrieve any outside labs. Concern for occult malignancy as cause of weight loss remains in differential.

## 2014-08-17 NOTE — Assessment & Plan Note (Signed)
Unintentional. CT now to evaluate for occult malignancy.

## 2014-08-18 ENCOUNTER — Ambulatory Visit (HOSPITAL_COMMUNITY)
Admission: RE | Admit: 2014-08-18 | Discharge: 2014-08-18 | Disposition: A | Discharge status: Home / Self Care | Payer: Medicare HMO | Source: Ambulatory Visit | Attending: Gastroenterology | Admitting: Gastroenterology

## 2014-08-18 DIAGNOSIS — R911 Solitary pulmonary nodule: Secondary | ICD-10-CM | POA: Insufficient documentation

## 2014-08-18 DIAGNOSIS — R197 Diarrhea, unspecified: Secondary | ICD-10-CM | POA: Diagnosis not present

## 2014-08-18 DIAGNOSIS — R634 Abnormal weight loss: Secondary | ICD-10-CM | POA: Diagnosis present

## 2014-08-18 DIAGNOSIS — K5730 Diverticulosis of large intestine without perforation or abscess without bleeding: Secondary | ICD-10-CM | POA: Insufficient documentation

## 2014-08-18 LAB — POCT I-STAT CREATININE: Creatinine, Ser: 0.7 mg/dL (ref 0.50–1.10)

## 2014-08-18 MED ORDER — SODIUM CHLORIDE 0.9 % IJ SOLN
INTRAMUSCULAR | Status: AC
  Filled 2014-08-18: qty 500

## 2014-08-18 MED ORDER — IOHEXOL 300 MG/ML  SOLN
100.0000 mL | Freq: Once | INTRAMUSCULAR | Status: AC | PRN
  Administered 2014-08-18: 100 mL via INTRAVENOUS

## 2014-08-18 MED ORDER — SODIUM CHLORIDE 0.9 % IJ SOLN
INTRAMUSCULAR | Status: AC
  Filled 2014-08-18: qty 45

## 2014-08-23 NOTE — Progress Notes (Signed)
cc'ed to pcp °

## 2014-08-24 ENCOUNTER — Telehealth: Payer: Self-pay | Admitting: Internal Medicine

## 2014-08-24 NOTE — Telephone Encounter (Signed)
Pt called to see if her results from her CT were available. Please call her at [phone]

## 2014-08-24 NOTE — Telephone Encounter (Signed)
Routing to AS 

## 2014-08-25 NOTE — Telephone Encounter (Signed)
See result note.  

## 2014-08-25 NOTE — Progress Notes (Signed)
Quick Note:  CT reviewed. Mild wall thickening of sigmoid felt to be secondary to chronic diverticulosis. No occult malignancy identified.  There is a 81mm nodular opacity in right lower lobe of lung. Needs CT chest in 1 year.   How is she doing with pepto and imodium? If she is still having persistent diarrhea, I would like to trial pancreatic enzymes possibly.  Tonya Bradley: do we have outside labs on her yet? Thanks! ______

## 2014-08-25 NOTE — Telephone Encounter (Signed)
I spoke with the pt about ct results.

## 2014-08-26 NOTE — Progress Notes (Signed)
CT NIC'D

## 2014-09-06 ENCOUNTER — Telehealth: Payer: Self-pay | Admitting: Internal Medicine

## 2014-09-06 MED ORDER — PANCRELIPASE (LIP-PROT-AMYL) 36000-114000 UNITS PO CPEP: 36000.0000 [IU] | ORAL_CAPSULE | Freq: Three times a day (TID) | ORAL | Status: DC

## 2014-09-06 NOTE — Telephone Encounter (Signed)
I sent in Creon 36,000 units to pharmacy. Take 1 capsule TID with meals. Please provide samples. Tell her not to pick up rx until she tries samples.

## 2014-09-06 NOTE — Progress Notes (Signed)
Quick Note:  See phone note regarding enzymes. ______

## 2014-09-06 NOTE — Telephone Encounter (Signed)
Pt called today saying that she spoke with JL last Thursday about her CT results and was going to check on getting some samples. Patient does not remember name of samples and hasn't heard back from [place] and there isn't anything in the sample drawer up front for her. Please advise and call patient back at [phone]

## 2014-09-07 NOTE — Telephone Encounter (Signed)
Samples and instructions on how to take them,  are at the front desk. Pt is aware.

## 2014-09-28 ENCOUNTER — Other Ambulatory Visit: Payer: Self-pay

## 2014-09-28 ENCOUNTER — Telehealth: Payer: Self-pay

## 2014-09-28 ENCOUNTER — Encounter: Payer: Self-pay | Admitting: Internal Medicine

## 2014-09-28 DIAGNOSIS — R634 Abnormal weight loss: Secondary | ICD-10-CM

## 2014-09-28 DIAGNOSIS — R197 Diarrhea, unspecified: Secondary | ICD-10-CM

## 2014-09-28 NOTE — Telephone Encounter (Signed)
Pt called- left voicemail- she said the samples of creon werent working and she was still loosing weight and still has diarrhea. I tried to call her back to get more information , pt didn't answer, [facility]

## 2014-09-28 NOTE — Telephone Encounter (Signed)
Pt is aware. Lab order done and faxed to the lab. Erline Levine, please schedule ov

## 2014-09-28 NOTE — Telephone Encounter (Signed)
APPT MADE AND LETTER SENT  °

## 2014-09-28 NOTE — Telephone Encounter (Signed)
Last labs I received are from Jan 2015. I think we need to get an updated CBC, TSH.   Needs OV, especially with her symptoms.

## 2014-09-30 LAB — CBC WITH DIFFERENTIAL/PLATELET
Basophils Absolute: 0 10*3/uL (ref 0.0–0.1)
Basophils Relative: 0 % (ref 0–1)
Eosinophils Absolute: 0 10*3/uL (ref 0.0–0.7)
Eosinophils Relative: 0 % (ref 0–5)
HCT: 40.6 % (ref 36.0–46.0)
Hemoglobin: 14.4 g/dL (ref 12.0–15.0)
Lymphocytes Relative: 34 % (ref 12–46)
Lymphs Abs: 2.9 10*3/uL (ref 0.7–4.0)
MCH: 31.3 pg (ref 26.0–34.0)
MCHC: 35.5 g/dL (ref 30.0–36.0)
MCV: 88.3 fL (ref 78.0–100.0)
Monocytes Absolute: 0.7 10*3/uL (ref 0.1–1.0)
Monocytes Relative: 8 % (ref 3–12)
NEUTROS PCT: 58 % (ref 43–77)
Neutro Abs: 4.9 10*3/uL (ref 1.7–7.7)
Platelets: 322 10*3/uL (ref 150–400)
RBC: 4.6 MIL/uL (ref 3.87–5.11)
RDW: 13.4 % (ref 11.5–15.5)
WBC: 8.5 10*3/uL (ref 4.0–10.5)

## 2014-09-30 LAB — TSH: TSH: 0.862 u[IU]/mL (ref 0.350–4.500)

## 2014-10-04 NOTE — Progress Notes (Signed)
Quick Note:  TSH, CBC normal. Good news. Keep upcoming office visit. ______

## 2014-11-02 ENCOUNTER — Ambulatory Visit: Payer: Medicare HMO | Admitting: Gastroenterology

## 2014-11-02 ENCOUNTER — Encounter: Payer: Self-pay | Admitting: Gastroenterology

## 2014-11-03 ENCOUNTER — Ambulatory Visit: Payer: Medicare HMO | Admitting: Gastroenterology

## 2015-07-04 ENCOUNTER — Telehealth: Payer: Self-pay | Admitting: Internal Medicine

## 2015-07-04 NOTE — Telephone Encounter (Signed)
PATENT ON RECALL FOR I YR CT SCAN

## 2015-07-04 NOTE — Telephone Encounter (Signed)
Letter in the mail 

## 2016-02-21 DIAGNOSIS — F1320 Sedative, hypnotic or anxiolytic dependence, uncomplicated: Secondary | ICD-10-CM | POA: Insufficient documentation

## 2016-02-22 ENCOUNTER — Encounter: Payer: Self-pay | Admitting: Internal Medicine

## 2016-03-01 ENCOUNTER — Ambulatory Visit (INDEPENDENT_AMBULATORY_CARE_PROVIDER_SITE_OTHER): Payer: Medicare HMO | Admitting: Internal Medicine

## 2016-03-01 ENCOUNTER — Other Ambulatory Visit: Payer: Self-pay

## 2016-03-01 ENCOUNTER — Encounter: Payer: Self-pay | Admitting: Internal Medicine

## 2016-03-01 VITALS — BP 115/71 | HR 74 | Temp 97.4°F | Ht 62.0 in | Wt 146.0 lb

## 2016-03-01 DIAGNOSIS — K5909 Other constipation: Secondary | ICD-10-CM

## 2016-03-01 DIAGNOSIS — R1310 Dysphagia, unspecified: Secondary | ICD-10-CM | POA: Diagnosis not present

## 2016-03-01 DIAGNOSIS — J984 Other disorders of lung: Secondary | ICD-10-CM

## 2016-03-01 DIAGNOSIS — R911 Solitary pulmonary nodule: Secondary | ICD-10-CM

## 2016-03-01 NOTE — Progress Notes (Signed)
Primary Care Physician:  [name], NP Primary Gastroenterologist:  Dr. Gala Romney  Pre-Procedure History & Physical: HPI:  Tonya Bradley is a 65 y.o. female here for further evaluation of esophageal dysphagia. Patient describes a many year history of intermittent esophageal dysphagia particularly to meats. She states she fears going out to the steak house;   she almost always feels a piece of meat "sticks". She often must leave the table, go to the bathroom and tried to "get it out". Previously had long-standing reflux for many years but the symptoms have settled lately. She does wear dentures. She has not lost any weight;  no abdominal pain, melena, rectal bleeding. History of lymphocytic colitis diagnosed and treated through this office. Those symptoms are now in remission, if anything, she now has constipation and takes various OTC agents for that problem. She does not consume alcohol;  she is not on any acid suppression therapy.  Patient continues to smoke. History of pulmonary nodule seen on 2015 CT. 1 year follow-up CT recommended but apparently has not been done.  No prior upper GI evaluation, endoscopy, etc.  Past Medical History  Diagnosis Date  . Colon polyp   . Anxiety   . Hypercholesterolemia   . CAD (coronary artery disease)   . COPD (chronic obstructive pulmonary disease) [facility])     Past Surgical History  Procedure Laterality Date  . Cardiac catheterization    . Cholecystectomy    . Basal cell carcinoma excision    . Coronary angioplasty with stent placement    . Colonoscopy N/A 06/23/2014    Dr. Eimy Plaza:Rectal and colonic polyps-removed, Pancolonic diverticulosis. lymphocytic colitis    Prior to Admission medications   Medication Sig Start Date End Date Taking? Authorizing Provider  ALPRAZolam Duanne Moron) 1 MG tablet Take 1 tablet by mouth 3 (three) times daily. 12/10/13  Yes Historical Provider, MD  atorvastatin (LIPITOR) 40 MG tablet Take by mouth. 02/21/16 02/20/17 Yes  Historical Provider, MD  cholecalciferol (VITAMIN D) 1000 UNITS tablet Take 1,000 Units by mouth daily.   Yes Historical Provider, MD  hydrochlorothiazide (HYDRODIURIL) 25 MG tablet Take 25 mg by mouth daily as needed (swelling).    Yes Historical Provider, MD  ibuprofen (ADVIL,MOTRIN) 200 MG tablet Take 400-600 mg by mouth every 6 (six) hours as needed for fever, headache or moderate pain.   Yes Historical Provider, MD  mirtazapine (REMERON) 15 MG tablet Take 7.5 mg by mouth at bedtime.   Yes Historical Provider, MD  rOPINIRole (REQUIP) 0.25 MG tablet Take 1 tablet by mouth at bedtime as needed (restless legs).  05/31/14 05/31/18 Yes Historical Provider, MD  azithromycin (ZITHROMAX) 250 MG tablet Reported on 03/01/2016 02/21/16   Historical Provider, MD  bismuth subsalicylate (PEPTO-BISMOL) 262 MG chewable tablet Chew 3 tablets (786 mg total) by mouth 3 (three) times daily. Patient not taking: Reported on 03/01/2016 08/15/14   Orvil Feil, NP  lipase/protease/amylase (CREON) 36000 UNITS CPEP capsule Take 1 capsule (36,000 Units total) by mouth 3 (three) times daily with meals. Patient not taking: Reported on 03/01/2016 09/06/14   Orvil Feil, NP  simvastatin (ZOCOR) 40 MG tablet Take 40 mg by mouth daily. Reported on 03/01/2016    Historical Provider, MD    Allergies as of 03/01/2016  . (No Known Allergies)    Family History  Problem Relation Age of Onset  . Colon cancer Neg Hx   . Breast cancer Sister     Social History   Social History  .  Marital Status: Widowed    Spouse Name: N/A  . Number of Children: N/A  . Years of Education: N/A   Occupational History  . Not on file.   Social History Main Topics  . Smoking status: Current Every Day Smoker -- 2.00 packs/day    Types: Cigarettes  . Smokeless tobacco: Not on file  . Alcohol Use: No  . Drug Use: No  . Sexual Activity: Not on file   Other Topics Concern  . Not on file   Social History Narrative    Review of Systems: See  HPI, otherwise negative ROS  Physical Exam: BP 115/71 mmHg  Pulse 74  Temp(Src) 97.4 F (36.3 C)  Ht '5\' 2"'$  (1.575 m)  Wt 146 lb (66.225 kg)  BMI 26.70 kg/m2 General:   Alert,  Well-developed, well-nourished, pleasant and cooperative in NAD Skin:  Intact without significant lesions or rashes. Eyes:  Sclera clear, no icterus.   Conjunctiva pink. Ears:  Normal auditory acuity. Nose:  No deformity, discharge,  or lesions. Mouth:  No deformity or lesions.edentulous Neck:  Supple; no masses or thyromegaly. No significant cervical adenopathy. Lungs:  Clear throughout to auscultation.   No wheezes, crackles, or rhonchi. No acute distress. Heart:  Regular rate and rhythm; no murmurs, clicks, rubs,  or gallops. Abdomen: Non-distended, normal bowel sounds.  Soft and nontender without appreciable mass or hepatosplenomegaly.  Pulses:  Normal pulses noted. Extremities:  Without clubbing or edema.  Impression:  Pleasant 65 year old lady with dentures here now for chronic insidiously progressive esophageal dysphagia to solids. History of GERD but no such symptoms lately. She nicely describes classic "steakhouse coronary" syndrome. She likely has suffered recurrent transient food impactions. She may have a Schatzki's ring, web or stricture. Ongoing tobacco use does increase her chances for esophageal malignancy.  Pulmonary nodule needs follow-up as previously recommended.  History lymphocytic colitis in remission. Now, constipated. OTC agents she has tried thus far without satisfactory benefit. She has not tried MiraLax or Linzess.  Recommendations:  I recommended either barium esophagram or EGD in the near future. All things considered, after a discussion, we mutually agreed to proceed with an EGD initially. Will schedule an EGD with possible esophageal dilation in the near future. The risks, benefits, limitations, alternatives and imponderables have been reviewed with the patient. Potential for  esophageal dilation, biopsy, etc. have also been reviewed.  Questions have been answered. All parties agreeable.Conscious sedation. Premedication Phenergan 12.5 mg IV  Given ongoing benzodiazepine use.  Constipation information provided  Use MiraLax 1 capful daily to twice daily as needed for constipation  Schedule follow-up chest CT for pulmonary lesion seen on prior abdominal CT  Further recommendations to follow.      Notice: This dictation was prepared with Dragon dictation along with smaller phrase technology. Any transcriptional errors that result from this process are unintentional and may not be corrected upon review.

## 2016-03-01 NOTE — Patient Instructions (Signed)
Will schedule an EGD with possible esophageal dilation in the near future. Conscious sedation. Premedication Phenergan 12.5 mg IV  Constipation information provided  Use MiraLax 1 capful daily to twice daily as needed for constipation  Schedule follow-up chest CT for pulmonary lesion seen on prior abdominal CT  Further recommendations to follow.

## 2016-03-06 ENCOUNTER — Other Ambulatory Visit: Payer: Self-pay

## 2016-03-06 DIAGNOSIS — R1310 Dysphagia, unspecified: Secondary | ICD-10-CM

## 2016-03-07 ENCOUNTER — Ambulatory Visit (HOSPITAL_COMMUNITY)
Admission: RE | Admit: 2016-03-07 | Discharge: 2016-03-07 | Disposition: A | Discharge status: Home / Self Care | Payer: Medicare HMO | Source: Ambulatory Visit | Attending: Internal Medicine | Admitting: Internal Medicine

## 2016-03-07 DIAGNOSIS — J984 Other disorders of lung: Secondary | ICD-10-CM | POA: Diagnosis not present

## 2016-03-07 LAB — POCT I-STAT CREATININE: Creatinine, Ser: 0.7 mg/dL (ref 0.44–1.00)

## 2016-03-07 MED ORDER — IOHEXOL 300 MG/ML  SOLN
75.0000 mL | Freq: Once | INTRAMUSCULAR | Status: AC | PRN
  Administered 2016-03-07: 75 mL via INTRAVENOUS

## 2016-03-13 ENCOUNTER — Other Ambulatory Visit: Payer: Self-pay

## 2016-03-13 ENCOUNTER — Inpatient Hospital Stay (HOSPITAL_COMMUNITY)
Admission: EM | Admit: 2016-03-13 | Discharge: 2016-03-15 | DRG: 312 | Disposition: A | Discharge status: Home / Self Care | Payer: Medicare HMO | Attending: Family Medicine | Admitting: Family Medicine

## 2016-03-13 ENCOUNTER — Encounter (HOSPITAL_COMMUNITY)
Admission: RE | Disposition: A | Discharge status: Still In Hospital | Payer: Self-pay | Source: Ambulatory Visit | Attending: Internal Medicine

## 2016-03-13 ENCOUNTER — Encounter (HOSPITAL_COMMUNITY): Payer: Self-pay

## 2016-03-13 ENCOUNTER — Emergency Department (HOSPITAL_COMMUNITY): Payer: Medicare HMO

## 2016-03-13 ENCOUNTER — Ambulatory Visit (HOSPITAL_BASED_OUTPATIENT_CLINIC_OR_DEPARTMENT_OTHER)
Admission: RE | Admit: 2016-03-13 | Discharge: 2016-03-13 | Disposition: A | Discharge status: Still In Hospital | Payer: Medicare HMO | Source: Ambulatory Visit | Attending: Internal Medicine | Admitting: Internal Medicine

## 2016-03-13 ENCOUNTER — Encounter (HOSPITAL_COMMUNITY): Payer: Self-pay | Admitting: *Deleted

## 2016-03-13 DIAGNOSIS — F17210 Nicotine dependence, cigarettes, uncomplicated: Secondary | ICD-10-CM | POA: Diagnosis present

## 2016-03-13 DIAGNOSIS — J449 Chronic obstructive pulmonary disease, unspecified: Secondary | ICD-10-CM | POA: Diagnosis present

## 2016-03-13 DIAGNOSIS — Z86010 Personal history of colonic polyps: Secondary | ICD-10-CM

## 2016-03-13 DIAGNOSIS — K209 Esophagitis, unspecified: Secondary | ICD-10-CM | POA: Diagnosis not present

## 2016-03-13 DIAGNOSIS — Z85828 Personal history of other malignant neoplasm of skin: Secondary | ICD-10-CM

## 2016-03-13 DIAGNOSIS — Z955 Presence of coronary angioplasty implant and graft: Secondary | ICD-10-CM

## 2016-03-13 DIAGNOSIS — K2100 Gastro-esophageal reflux disease with esophagitis, without bleeding: Secondary | ICD-10-CM | POA: Insufficient documentation

## 2016-03-13 DIAGNOSIS — K449 Diaphragmatic hernia without obstruction or gangrene: Secondary | ICD-10-CM | POA: Diagnosis present

## 2016-03-13 DIAGNOSIS — R1310 Dysphagia, unspecified: Secondary | ICD-10-CM

## 2016-03-13 DIAGNOSIS — J690 Pneumonitis due to inhalation of food and vomit: Secondary | ICD-10-CM | POA: Diagnosis not present

## 2016-03-13 DIAGNOSIS — I4892 Unspecified atrial flutter: Secondary | ICD-10-CM | POA: Diagnosis not present

## 2016-03-13 DIAGNOSIS — K222 Esophageal obstruction: Secondary | ICD-10-CM | POA: Diagnosis not present

## 2016-03-13 DIAGNOSIS — R0902 Hypoxemia: Secondary | ICD-10-CM | POA: Diagnosis present

## 2016-03-13 DIAGNOSIS — T50995A Adverse effect of other drugs, medicaments and biological substances, initial encounter: Secondary | ICD-10-CM | POA: Diagnosis present

## 2016-03-13 DIAGNOSIS — Z803 Family history of malignant neoplasm of breast: Secondary | ICD-10-CM

## 2016-03-13 DIAGNOSIS — I959 Hypotension, unspecified: Secondary | ICD-10-CM | POA: Diagnosis not present

## 2016-03-13 DIAGNOSIS — R9389 Abnormal findings on diagnostic imaging of other specified body structures: Secondary | ICD-10-CM

## 2016-03-13 DIAGNOSIS — I952 Hypotension due to drugs: Principal | ICD-10-CM | POA: Diagnosis present

## 2016-03-13 DIAGNOSIS — I2510 Atherosclerotic heart disease of native coronary artery without angina pectoris: Secondary | ICD-10-CM | POA: Diagnosis present

## 2016-03-13 HISTORY — DX: Unspecified atrial flutter: I48.92

## 2016-03-13 HISTORY — PX: ESOPHAGEAL DILATION: SHX303

## 2016-03-13 HISTORY — PX: ESOPHAGOGASTRODUODENOSCOPY: SHX5428

## 2016-03-13 LAB — CBC WITH DIFFERENTIAL/PLATELET
BASOS PCT: 1 %
Basophils Absolute: 0 10*3/uL (ref 0.0–0.1)
EOS PCT: 0 %
Eosinophils Absolute: 0 10*3/uL (ref 0.0–0.7)
HEMATOCRIT: 35.5 % — AB (ref 36.0–46.0)
Hemoglobin: 11.8 g/dL — ABNORMAL LOW (ref 12.0–15.0)
Lymphocytes Relative: 44 %
Lymphs Abs: 3 10*3/uL (ref 0.7–4.0)
MCH: 31.6 pg (ref 26.0–34.0)
MCHC: 33.2 g/dL (ref 30.0–36.0)
MCV: 94.9 fL (ref 78.0–100.0)
MONO ABS: 0.4 10*3/uL (ref 0.1–1.0)
Monocytes Relative: 6 %
Neutro Abs: 3.3 10*3/uL (ref 1.7–7.7)
Neutrophils Relative %: 49 %
PLATELETS: 225 10*3/uL (ref 150–400)
RBC: 3.74 MIL/uL — ABNORMAL LOW (ref 3.87–5.11)
RDW: 14 % (ref 11.5–15.5)
WBC: 6.8 10*3/uL (ref 4.0–10.5)

## 2016-03-13 LAB — COMPREHENSIVE METABOLIC PANEL
ALBUMIN: 3 g/dL — AB (ref 3.5–5.0)
ALK PHOS: 67 U/L (ref 38–126)
ALT: 19 U/L (ref 14–54)
ANION GAP: 6 (ref 5–15)
AST: 20 U/L (ref 15–41)
BUN: 8 mg/dL (ref 6–20)
CALCIUM: 7.7 mg/dL — AB (ref 8.9–10.3)
CO2: 27 mmol/L (ref 22–32)
CREATININE: 0.54 mg/dL (ref 0.44–1.00)
Chloride: 111 mmol/L (ref 101–111)
GFR calc Af Amer: >60 mL/min (ref >60)
GFR calc non Af Amer: >60 mL/min (ref >60)
GLUCOSE: 103 mg/dL — AB (ref 65–99)
Potassium: 3.5 mmol/L (ref 3.5–5.1)
SODIUM: 144 mmol/L (ref 135–145)
Total Bilirubin: 0.3 mg/dL (ref 0.3–1.2)
Total Protein: 5.5 g/dL — ABNORMAL LOW (ref 6.5–8.1)

## 2016-03-13 LAB — MRSA PCR SCREENING: MRSA by PCR: NEGATIVE

## 2016-03-13 LAB — I-STAT CG4 LACTIC ACID, ED: Lactic Acid, Venous: 1.64 mmol/L (ref 0.5–2.0)

## 2016-03-13 LAB — URINALYSIS, ROUTINE W REFLEX MICROSCOPIC
Bilirubin Urine: NEGATIVE
GLUCOSE, UA: NEGATIVE mg/dL
Hgb urine dipstick: NEGATIVE
KETONES UR: NEGATIVE mg/dL
LEUKOCYTES UA: NEGATIVE
NITRITE: NEGATIVE
PH: 5.5 (ref 5.0–8.0)
Protein, ur: NEGATIVE mg/dL
SPECIFIC GRAVITY, URINE: 1.025 (ref 1.005–1.030)

## 2016-03-13 LAB — TSH: TSH: 1.028 u[IU]/mL (ref 0.350–4.500)

## 2016-03-13 LAB — I-STAT TROPONIN, ED: TROPONIN I, POC: 0.01 ng/mL (ref 0.00–0.08)

## 2016-03-13 LAB — TROPONIN I

## 2016-03-13 SURGERY — EGD (ESOPHAGOGASTRODUODENOSCOPY)
Anesthesia: Moderate Sedation

## 2016-03-13 MED ORDER — PROMETHAZINE HCL 25 MG/ML IJ SOLN
12.5000 mg | Freq: Once | INTRAMUSCULAR | Status: AC
  Administered 2016-03-13: 12.5 mg via INTRAVENOUS

## 2016-03-13 MED ORDER — MIDAZOLAM HCL 5 MG/5ML IJ SOLN
INTRAMUSCULAR | Status: AC
  Filled 2016-03-13: qty 10

## 2016-03-13 MED ORDER — ROPINIROLE HCL 0.25 MG PO TABS
0.7500 mg | ORAL_TABLET | Freq: Every evening | ORAL | Status: DC | PRN
  Filled 2016-03-13: qty 3

## 2016-03-13 MED ORDER — MIRTAZAPINE 7.5 MG PO TABS
7.5000 mg | ORAL_TABLET | Freq: Every day | ORAL | Status: DC
  Filled 2016-03-13 (×2): qty 1

## 2016-03-13 MED ORDER — MEPERIDINE HCL 100 MG/ML IJ SOLN
INTRAMUSCULAR | Status: DC | PRN
  Administered 2016-03-13: 25 mg via INTRAVENOUS

## 2016-03-13 MED ORDER — MIDAZOLAM HCL 5 MG/5ML IJ SOLN
INTRAMUSCULAR | Status: DC | PRN
  Administered 2016-03-13 (×3): 1 mg via INTRAVENOUS
  Administered 2016-03-13: 2 mg via INTRAVENOUS

## 2016-03-13 MED ORDER — PHENYLEPHRINE HCL 10 MG/ML IJ SOLN
INTRAMUSCULAR | Status: AC
  Filled 2016-03-13: qty 2

## 2016-03-13 MED ORDER — ENOXAPARIN SODIUM 80 MG/0.8ML ~~LOC~~ SOLN
1.0000 mg/kg | Freq: Two times a day (BID) | SUBCUTANEOUS | Status: DC
  Administered 2016-03-13 – 2016-03-15 (×4): 65 mg via SUBCUTANEOUS
  Filled 2016-03-13 (×4): qty 0.8

## 2016-03-13 MED ORDER — MEPERIDINE HCL 100 MG/ML IJ SOLN
INTRAMUSCULAR | Status: AC
  Filled 2016-03-13: qty 2

## 2016-03-13 MED ORDER — PHENYLEPHRINE HCL 10 MG/ML IJ SOLN
0.0000 ug/min | INTRAVENOUS | Status: DC
  Administered 2016-03-13: 70 ug/min via INTRAVENOUS
  Administered 2016-03-14: 20 ug/min via INTRAVENOUS
  Filled 2016-03-13: qty 2

## 2016-03-13 MED ORDER — PANTOPRAZOLE SODIUM 40 MG PO TBEC
40.0000 mg | DELAYED_RELEASE_TABLET | Freq: Every day | ORAL | Status: DC
  Administered 2016-03-13 – 2016-03-15 (×3): 40 mg via ORAL
  Filled 2016-03-13 (×3): qty 1

## 2016-03-13 MED ORDER — SODIUM CHLORIDE 0.9 % IV BOLUS (SEPSIS)
1000.0000 mL | Freq: Once | INTRAVENOUS | Status: AC
  Administered 2016-03-13: 1000 mL via INTRAVENOUS

## 2016-03-13 MED ORDER — LIDOCAINE VISCOUS 2 % MT SOLN
OROMUCOSAL | Status: AC
  Filled 2016-03-13: qty 15

## 2016-03-13 MED ORDER — PROMETHAZINE HCL 25 MG/ML IJ SOLN
INTRAMUSCULAR | Status: AC
  Filled 2016-03-13: qty 1

## 2016-03-13 MED ORDER — ONDANSETRON HCL 4 MG/2ML IJ SOLN
INTRAMUSCULAR | Status: AC
  Filled 2016-03-13: qty 2

## 2016-03-13 MED ORDER — ONDANSETRON HCL 4 MG/2ML IJ SOLN: 4.0000 mg | Freq: Four times a day (QID) | INTRAMUSCULAR | Status: DC | PRN

## 2016-03-13 MED ORDER — MIRTAZAPINE 15 MG PO TABS
7.5000 mg | ORAL_TABLET | Freq: Every day | ORAL | Status: DC
  Filled 2016-03-13 (×5): qty 0.5

## 2016-03-13 MED ORDER — NALOXONE HCL 0.4 MG/ML IJ SOLN
0.4000 mg | Freq: Once | INTRAMUSCULAR | Status: AC
  Administered 2016-03-13: 0.4 mg via INTRAVENOUS
  Filled 2016-03-13: qty 1

## 2016-03-13 MED ORDER — ALPRAZOLAM 0.5 MG PO TABS
1.0000 mg | ORAL_TABLET | Freq: Two times a day (BID) | ORAL | Status: DC | PRN
Start: 2016-03-13 — End: 2016-03-15
  Administered 2016-03-15: 1 mg via ORAL
  Filled 2016-03-13: qty 2

## 2016-03-13 MED ORDER — SODIUM CHLORIDE 0.9 % IV BOLUS (SEPSIS)
500.0000 mL | Freq: Once | INTRAVENOUS | Status: AC
  Administered 2016-03-13: 500 mL via INTRAVENOUS

## 2016-03-13 MED ORDER — LIDOCAINE VISCOUS 2 % MT SOLN
OROMUCOSAL | Status: DC | PRN
  Administered 2016-03-13: 1 via OROMUCOSAL

## 2016-03-13 MED ORDER — ACETAMINOPHEN 325 MG PO TABS: 650.0000 mg | ORAL_TABLET | ORAL | Status: DC | PRN

## 2016-03-13 MED ORDER — SODIUM CHLORIDE 0.9% FLUSH
INTRAVENOUS | Status: AC
  Filled 2016-03-13: qty 10

## 2016-03-13 MED ORDER — IBUPROFEN 400 MG PO TABS: 400.0000 mg | ORAL_TABLET | Freq: Four times a day (QID) | ORAL | Status: DC | PRN

## 2016-03-13 MED ORDER — SODIUM CHLORIDE 0.9 % IV SOLN
INTRAVENOUS | Status: AC
  Administered 2016-03-13: 17:00:00 via INTRAVENOUS

## 2016-03-13 MED ORDER — SODIUM CHLORIDE 0.9 % IV SOLN
INTRAVENOUS | Status: DC
  Administered 2016-03-13: 09:00:00 via INTRAVENOUS

## 2016-03-13 MED ORDER — NITROGLYCERIN 0.4 MG SL SUBL: 0.4000 mg | SUBLINGUAL_TABLET | SUBLINGUAL | Status: DC | PRN

## 2016-03-13 MED ORDER — ESCITALOPRAM OXALATE 10 MG PO TABS
20.0000 mg | ORAL_TABLET | Freq: Every day | ORAL | Status: DC
  Administered 2016-03-13 – 2016-03-15 (×3): 20 mg via ORAL
  Filled 2016-03-13 (×3): qty 2

## 2016-03-13 MED ORDER — SODIUM CHLORIDE 0.9 % IV SOLN
INTRAVENOUS | Status: DC
  Administered 2016-03-13: 13:00:00 via INTRAVENOUS

## 2016-03-13 MED ORDER — DEXTROSE 5 % IV SOLN
0.0000 ug/min | INTRAVENOUS | Status: DC
  Administered 2016-03-13: 20 ug/min via INTRAVENOUS
  Filled 2016-03-13: qty 1

## 2016-03-13 NOTE — ED Notes (Signed)
Per ENDO staff, patient's pre op BP was 101/67. BP was 70's/50's after sedation in ENDO.

## 2016-03-13 NOTE — H&P (Signed)
History and Physical  Lekha Dancer NFA:[id number] DOB: Jul 30, 1951 DOA: 03/13/2016  Referring physician: Dr. Rogene Houston in ED PCP: [name], NP   Chief Complaint: Low blood pressure  HPI:  65 year old woman presented from endoscopy where she had an outpatient EGD with dilatation done for intraoperative hypotension and atrial flutter. She was treated with aggressive IV fluids but remained hypotensive, was started on vasopressor and referred for admission. However, lactic acid and other testing was unremarkable and her initial presentation was felt to be related to procedural medications.  Patient has absolutely no complaints at this point, "I feel well I want to go home". No chest pain, no shortness of breath. She has felt well lately and has had no chest pain or left arm pain (typical symptom of her heart disease in the past). She is compliant with medications including Toprol-XL which because of low blood pressure has been decreased to once every other day by her cardiologist. She reports she runs low normal blood pressure 90-100 persistently. No shortness of breath. Review of systems is unremarkable.  Per discussion with her gastroenterologist she underwent on uneventful EGD today except for intraoperative hypotension. Postoperatively she was found to have atrial flutter on the monitor. EKG was concerning for STEMI and she was sent to the emergency department where this was ruled out. However despite fluid she remained hypotensive and was referred for admission.   In the emergency department systolic blood pressure 46-96E. Heart rate 90-1 100s. Normal respiratory rate. No hypoxia. Pertinent labs: Lactic acid normal, complete metabolic panel unremarkable. CBC unremarkable. EKG: Independently reviewed. Atrial flutter with RVR. Imaging: Chest x-ray independently reviewed. Right lower lung density, suspect artifact. Of note patient had CT scan 4/6 at which time there is no evidence of  significant abnormalities.  Review of Systems:  Negative for fever, visual changes, sore throat, rash, new muscle aches, chest pain, SOB, dysuria, bleeding, n/v/abdominal pain.  Past Medical History  Diagnosis Date  . Colon polyp   . Anxiety   . Hypercholesterolemia   . CAD (coronary artery disease)   . COPD (chronic obstructive pulmonary disease) ([place])   . Atrial flutter ([place]) 03/13/2016    Past Surgical History  Procedure Laterality Date  . Cholecystectomy    . Basal cell carcinoma excision    . Coronary angioplasty with stent placement    . Colonoscopy N/A 06/23/2014    Dr. Rourk:Rectal and colonic polyps-removed, Pancolonic diverticulosis. lymphocytic colitis  . Cardiac catheterization      Social History:  reports that she has been smoking Cigarettes.  She has been smoking about 2.00 packs per day. She does not have any smokeless tobacco history on file. She reports that she does not drink alcohol or use illicit drugs.    No Known Allergies  Family History  Problem Relation Age of Onset  . Colon cancer Neg Hx   . Breast cancer Sister      Prior to Admission medications   Medication Sig Start Date End Date Taking? Authorizing Provider  ALPRAZolam Duanne Moron) 1 MG tablet Take 1 tablet by mouth 3 (three) times daily. 12/10/13  Yes Historical Provider, MD  atorvastatin (LIPITOR) 40 MG tablet Take by mouth. 02/21/16 02/20/17 Yes Historical Provider, MD  cholecalciferol (VITAMIN D) 1000 UNITS tablet Take 1,000 Units by mouth daily.   Yes Historical Provider, MD  escitalopram (LEXAPRO) 20 MG tablet Take 20 mg by mouth daily. 02/21/16 02/20/17 Yes Historical Provider, MD  hydrochlorothiazide (HYDRODIURIL) 25 MG tablet Take 25 mg by mouth daily  as needed (swelling).    Yes Historical Provider, MD  hydrochlorothiazide (HYDRODIURIL) 25 MG tablet Take 25 mg by mouth daily. 02/21/16  Yes Historical Provider, MD  ibuprofen (ADVIL,MOTRIN) 200 MG tablet Take 400-600 mg by mouth every 6 (six)  hours as needed for fever, headache or moderate pain.   Yes Historical Provider, MD  metoprolol succinate (TOPROL XL) 25 MG 24 hr tablet Take 25 mg by mouth every other day. 02/21/16 02/20/17 Yes Historical Provider, MD  mirtazapine (REMERON) 15 MG tablet Take 7.5 mg by mouth at bedtime. Reported on 03/12/2016   Yes Historical Provider, MD  nitroGLYCERIN (NITROSTAT) 0.4 MG SL tablet Place 0.4 mg under the tongue every 5 (five) minutes as needed for chest pain.   Yes Historical Provider, MD  simvastatin (ZOCOR) 40 MG tablet Take 40 mg by mouth daily. Reported on 03/12/2016   Yes Historical Provider, MD  bismuth subsalicylate (PEPTO-BISMOL) 262 MG chewable tablet Chew 3 tablets (786 mg total) by mouth 3 (three) times daily. Patient not taking: Reported on 03/01/2016 08/15/14   Orvil Feil, NP  rOPINIRole (REQUIP) 0.25 MG tablet Take 1 tablet by mouth at bedtime as needed (restless legs).  05/31/14 05/31/18  Historical Provider, MD  rOPINIRole (REQUIP) 0.25 MG tablet Take 0.25 mg by mouth daily. 02/21/16   Historical Provider, MD   Physical Exam: Filed Vitals:   03/13/16 1630 03/13/16 1645 03/13/16 1700 03/13/16 1715  BP: '83/57 82/63 93/68 '$ 82/57  Pulse:   72 90  Temp:      TempSrc:      Resp: '20 21 16 16  '$ Height:      Weight:      SpO2: 90% 96% 96% 97%   Constitutional:  . Appears calm and comfortable, eating dinner, sitting up in bed Eyes:  . PERRL and irises appear normal . Normal conjunctivae and lids ENMT:  . external ears, nose appear normal . grossly normal hearing Neck:  . neck appears normal, no masses, normal ROM, supple . no thyromegaly Respiratory:  . CTA bilaterally, no w/r/r.  . Respiratory effort normal. No retractions or accessory muscle use Cardiovascular:  . RRR, no m/r/g . No LE extremity edema   Abdomen:  . Abdomen appears normal; no tenderness or masses . No hernias Musculoskeletal:  . Digits/nails UE: no clubbing, cyanosis, petechiae, infection . RUE, LUE, RLE,  LLE   o strength and tone normal, no atrophy, no abnormal movements o No tenderness, masses o Normal ROM, no contractures  Skin:  . No rashes, lesions, ulcers . palpation of skin: no induration or nodules Psychiatric:  . judgement and insight appear normal . Mental status o Mood, affect appropriate  Wt Readings from Last 3 Encounters:  03/13/16 65.318 kg (144 lb)  03/13/16 66.225 kg (146 lb)  03/01/16 66.225 kg (146 lb)    Labs on Admission:  Basic Metabolic Panel:  Recent Labs Lab 03/07/16 0900 03/13/16 1226  NA  --  144  K  --  3.5  CL  --  111  CO2  --  27  GLUCOSE  --  103*  BUN  --  8  CREATININE 0.70 0.54  CALCIUM  --  7.7*    Liver Function Tests:  Recent Labs Lab 03/13/16 1226  AST 20  ALT 19  ALKPHOS 67  BILITOT 0.3  PROT 5.5*  ALBUMIN 3.0*    CBC:  Recent Labs Lab 03/13/16 1226  WBC 6.8  NEUTROABS 3.3  HGB 11.8*  HCT 35.5*  MCV 94.9  PLT 225    Radiological Exams on Admission: Dg Chest Port 1 View  03/13/2016  CLINICAL DATA:  Atrial fibrillation, heart fluttering, palpitations, coronary artery disease, COPD, smoker EXAM: PORTABLE CHEST 1 VIEW COMPARISON:  Portable exam 1222 hours compared to 12/16/2013 correlated with CT chest of 03/07/2016 FINDINGS: Enlargement of cardiac silhouette. Mediastinal contours and pulmonary vascularity normal. Hazy density at lateral lower RIGHT lung may be related to asymmetric chest wall soft tissues but unable to completely exclude infiltrate. Remaining lungs clear. No pleural effusion or pneumothorax. Atherosclerotic calcifications aorta noted. Bones demineralized. IMPRESSION: Asymmetric density at lateral lower RIGHT chest, question due to superimposed asymmetric soft tissues though cannot completely exclude peripheral infiltrate at lower RIGHT lung. This finding is new since previous exams. Electronically Signed   By: Lavonia Dana M.D.   On: 03/13/2016 12:55      Principal Problem:   Hypotension Active  Problems:   Atrial flutter (HCC)   Assessment/Plan 1. Atrial flutter, new onset, asymptomatic. Not clear whether present prior to procedure. May be a phenomenon related to procedure medications. Currently rate controlled. CHA2DS2-VASc = 3. Discussed both EKGs with Dr. Harl Bowie, atrial flutter, no evidence of STEMI 2. Hypotension intra-and post-procedure. Completely asymptomatic. Lactic acid and troponin within normal limits. Chest x-ray clear. No signs or symptoms to suggest infection or ACS. Suspect related to intraoperative medications. On long-acting beta-blocker, last taken 4/11 in the evening. Of note this has been decreased to every other day because of her chronic low blood pressure. 3. Abnormal chest x-ray, no pulmonary symptoms. CT chest recently was unremarkable. While aspiration would be possible it does not appear likely based on her clinical appearance and exam. 4. CAD, quiescent. No chest pain or arm pain lately. 5. Esophagitis. Protonix daily. Follow-up with GI in 3 months. 6. COPD with nocturnal hypoxia. Appears stable.   Appears well, completely asymptomatic. Monitor in stepdown  She has received 4 L of IV fluids, no evidence of volume overload on exam. Continue IV fluids. She was started on phenylephrine in the emergency department, continue for now, hopefully can wean this off in the next few hours. Currently not requiring any rate control agent. If her blood pressure, heart rate elevates with pursue amiodarone.  Cardiology consultation the morning.  Repeat CXR in AM  Discussed in detail with Dr. Gala Romney, from his perspective okay to anticoagulate with Lovenox.  Code Status: full code  DVT prophylaxis:  Family Communication: husband at bedsdie Disposition Plan/Anticipated LOS: obs, 24 hours  Time spent: 60 minutes  Murray Hodgkins, MD  [facility] Direct contact:  --[place]ia [place]  --[email or web address]; password [email or web address] and click  [email or web address] contact night coverage as  above  03/13/2016, 5:53 PM

## 2016-03-13 NOTE — ED Provider Notes (Addendum)
CSN: [id number]     Arrival date & time 03/13/16  1046 History  By signing my name below, I, Terressa Koyanagi, attest that this documentation has been prepared under the direction and in the presence of Samuella Cota, MD. Electronically Signed: Terressa Koyanagi, ED Scribe. 03/13/2016. 11:40 AM  Chief Complaint  Patient presents with  . Irregular Heart Beat   The history is provided by the patient. No language interpreter was used.   PCP: [name], NP HPI Comments: Lexy Meininger is a 65 y.o. female, with PMHx noted below, who presents to the Emergency Department, at the direction of Endo staff, complaining of AFib onset today. Pt had an upper endoscopy and esophagus dilated completed today and post-op pt developed an irregular heart beat. Accordingly, Endo completed an EKG which incorrectly showed a stemi. Pt denies any Hx of AFib.Pt reports she felt fine prior to the procedure this morning. Pt denies any chest pain. Pt endorses mild swelling of BLE. Pt denies fever, chills, congestion, rhinorrhea, sore throat, cough, visual disturbances, abd pain, n/v/d, dysuria, hematuria, back pain, swelling of legs, headache, lightheadedness, or any new rashes. Pt further denies Hx of bleeding easily/blood thinner use.   Past Medical History  Diagnosis Date  . Colon polyp   . Anxiety   . Hypercholesterolemia   . CAD (coronary artery disease)   . COPD (chronic obstructive pulmonary disease) [facility])    Past Surgical History  Procedure Laterality Date  . Cholecystectomy    . Basal cell carcinoma excision    . Coronary angioplasty with stent placement    . Colonoscopy N/A 06/23/2014    Dr. Rourk:Rectal and colonic polyps-removed, Pancolonic diverticulosis. lymphocytic colitis  . Cardiac catheterization     Family History  Problem Relation Age of Onset  . Colon cancer Neg Hx   . Breast cancer Sister    Social History  Substance Use Topics  . Smoking status: Current Every Day Smoker -- 2.00  packs/day    Types: Cigarettes  . Smokeless tobacco: None  . Alcohol Use: No   OB History    No data available     Review of Systems  Constitutional: Negative for fever and chills.  HENT: Negative for congestion, rhinorrhea and sore throat.   Eyes: Negative for visual disturbance.  Respiratory: Negative for shortness of breath.   Cardiovascular: Positive for palpitations and leg swelling. Negative for chest pain.  Gastrointestinal: Negative for nausea, vomiting, abdominal pain and diarrhea.  Genitourinary: Negative for dysuria.  Musculoskeletal: Negative for back pain.  Skin: Negative for rash.  Neurological: Negative for headaches.  Hematological: Does not bruise/bleed easily.  Psychiatric/Behavioral: Negative for confusion.   Allergies  Review of patient's allergies indicates no known allergies.  Home Medications   Prior to Admission medications   Medication Sig Start Date End Date Taking? Authorizing Provider  ALPRAZolam Duanne Moron) 1 MG tablet Take 1 tablet by mouth 3 (three) times daily. 12/10/13  Yes Historical Provider, MD  atorvastatin (LIPITOR) 40 MG tablet Take by mouth. 02/21/16 02/20/17 Yes Historical Provider, MD  cholecalciferol (VITAMIN D) 1000 UNITS tablet Take 1,000 Units by mouth daily.   Yes Historical Provider, MD  escitalopram (LEXAPRO) 20 MG tablet Take 20 mg by mouth daily. 02/21/16 02/20/17 Yes Historical Provider, MD  hydrochlorothiazide (HYDRODIURIL) 25 MG tablet Take 25 mg by mouth daily as needed (swelling).    Yes Historical Provider, MD  ibuprofen (ADVIL,MOTRIN) 200 MG tablet Take 400-600 mg by mouth every 6 (six) hours as needed for  fever, headache or moderate pain.   Yes Historical Provider, MD  metoprolol succinate (TOPROL XL) 25 MG 24 hr tablet Take 25 mg by mouth every other day. 02/21/16 02/20/17 Yes Historical Provider, MD  mirtazapine (REMERON) 15 MG tablet Take 7.5 mg by mouth at bedtime. Reported on 03/12/2016   Yes Historical Provider, MD   nitroGLYCERIN (NITROSTAT) 0.4 MG SL tablet Place 0.4 mg under the tongue every 5 (five) minutes as needed for chest pain.   Yes Historical Provider, MD  rOPINIRole (REQUIP) 0.25 MG tablet Take 3 tablets by mouth at bedtime as needed (restless legs).  05/31/14 05/31/18 Yes Historical Provider, MD  bismuth subsalicylate (PEPTO-BISMOL) 262 MG chewable tablet Chew 3 tablets (786 mg total) by mouth 3 (three) times daily. Patient not taking: Reported on 03/01/2016 08/15/14   Orvil Feil, NP   Triage Vitals: BP 78/55 mmHg  Pulse 53  Temp(Src) 97.6 F (36.4 C) (Oral)  Resp 21  Ht '5\' 2"'$  (1.575 m)  Wt 144 lb (65.318 kg)  BMI 26.33 kg/m2  SpO2 99% Physical Exam  Constitutional: She is oriented to person, place, and time. She appears well-developed and well-nourished. No distress.  HENT:  Head: Normocephalic and atraumatic.  Eyes: EOM are normal. Pupils are equal, round, and reactive to light. No scleral icterus.  Eyes track normal.   Neck: Normal range of motion.  Cardiovascular: Normal heart sounds.  An irregular rhythm present. Tachycardia present.   Pulmonary/Chest: Effort normal and breath sounds normal.  Abdominal: Soft. Bowel sounds are normal. She exhibits no distension. There is no tenderness.  Musculoskeletal: Normal range of motion. She exhibits no edema (no swelling of BLE).  Neurological: She is alert and oriented to person, place, and time.  Skin: Skin is warm and dry.  Psychiatric: She has a normal mood and affect. Judgment normal.  Nursing note and vitals reviewed.   ED Course  Procedures (including critical care time) DIAGNOSTIC STUDIES: Oxygen Saturation is 99% on ra, nl by my interpretation.    COORDINATION OF CARE: 11:29 AM: Discussed treatment plan which includes discussing EKG results, chest Xray, and hospital admission with pt at bedside; patient verbalizes understanding and agrees with treatment plan.  Labs Review Labs Reviewed  COMPREHENSIVE METABOLIC PANEL -  Abnormal; Notable for the following:    Glucose, Bld 103 (*)    Calcium 7.7 (*)    Total Protein 5.5 (*)    Albumin 3.0 (*)    All other components within normal limits  CBC WITH DIFFERENTIAL/PLATELET - Abnormal; Notable for the following:    RBC 3.74 (*)    Hemoglobin 11.8 (*)    HCT 35.5 (*)    All other components within normal limits  URINALYSIS, ROUTINE W REFLEX MICROSCOPIC (NOT AT [facility])  I-STAT CG4 LACTIC ACID, ED  I-STAT TROPOININ, ED  I-STAT TROPOININ, ED  Randolm Idol, ED  I-STAT CG4 LACTIC ACID, ED   Results for orders placed or performed during the hospital encounter of 03/13/16  Comprehensive metabolic panel  Result Value Ref Range   Sodium 144 135 - 145 mmol/L   Potassium 3.5 3.5 - 5.1 mmol/L   Chloride 111 101 - 111 mmol/L   CO2 27 22 - 32 mmol/L   Glucose, Bld 103 (H) 65 - 99 mg/dL   BUN 8 6 - 20 mg/dL   Creatinine, Ser 0.54 0.44 - 1.00 mg/dL   Calcium 7.7 (L) 8.9 - 10.3 mg/dL   Total Protein 5.5 (L) 6.5 - 8.1 g/dL  Albumin 3.0 (L) 3.5 - 5.0 g/dL   AST 20 15 - 41 U/L   ALT 19 14 - 54 U/L   Alkaline Phosphatase 67 38 - 126 U/L   Total Bilirubin 0.3 0.3 - 1.2 mg/dL   GFR calc non Af Amer >60 >60 mL/min   GFR calc Af Amer >60 >60 mL/min   Anion gap 6 5 - 15  CBC with Differential/Platelet  Result Value Ref Range   WBC 6.8 4.0 - 10.5 K/uL   RBC 3.74 (L) 3.87 - 5.11 MIL/uL   Hemoglobin 11.8 (L) 12.0 - 15.0 g/dL   HCT 35.5 (L) 36.0 - 46.0 %   MCV 94.9 78.0 - 100.0 fL   MCH 31.6 26.0 - 34.0 pg   MCHC 33.2 30.0 - 36.0 g/dL   RDW 14.0 11.5 - 15.5 %   Platelets 225 150 - 400 K/uL   Neutrophils Relative % 49 %   Neutro Abs 3.3 1.7 - 7.7 K/uL   Lymphocytes Relative 44 %   Lymphs Abs 3.0 0.7 - 4.0 K/uL   Monocytes Relative 6 %   Monocytes Absolute 0.4 0.1 - 1.0 K/uL   Eosinophils Relative 0 %   Eosinophils Absolute 0.0 0.0 - 0.7 K/uL   Basophils Relative 1 %   Basophils Absolute 0.0 0.0 - 0.1 K/uL  I-Stat CG4 Lactic Acid, ED  Result Value Ref Range    Lactic Acid, Venous 1.64 0.5 - 2.0 mmol/L  I-Stat Troponin, ED (not at [facility])  Result Value Ref Range   Troponin i, poc 0.01 0.00 - 0.08 ng/mL   Comment 3             Imaging Review Dg Chest Port 1 View  03/13/2016  CLINICAL DATA:  Atrial fibrillation, heart fluttering, palpitations, coronary artery disease, COPD, smoker EXAM: PORTABLE CHEST 1 VIEW COMPARISON:  Portable exam 1222 hours compared to 12/16/2013 correlated with CT chest of 03/07/2016 FINDINGS: Enlargement of cardiac silhouette. Mediastinal contours and pulmonary vascularity normal. Hazy density at lateral lower RIGHT lung may be related to asymmetric chest wall soft tissues but unable to completely exclude infiltrate. Remaining lungs clear. No pleural effusion or pneumothorax. Atherosclerotic calcifications aorta noted. Bones demineralized. IMPRESSION: Asymmetric density at lateral lower RIGHT chest, question due to superimposed asymmetric soft tissues though cannot completely exclude peripheral infiltrate at lower RIGHT lung. This finding is new since previous exams. Electronically Signed   By: Lavonia Dana M.D.   On: 03/13/2016 12:55   I have personally reviewed and evaluated these images and lab results as part of my medical decision-making.   EKG Interpretation None      ED ECG REPORT   Date: 03/13/2016  Rate: 110  Rhythm: atrial flutter  QRS Axis: normal  Intervals: normal  ST/T Wave abnormalities: nonspecific ST/T changes  Conduction Disutrbances:none  Narrative Interpretation:   Old EKG Reviewed: none available  I have personally reviewed the EKG tracing and agree with the computerized printout as noted.   CRITICAL CARE Performed by: Fredia Sorrow Total critical care time: 30 minutes Critical care time was exclusive of separately billable procedures and treating other patients. Critical care was necessary to treat or prevent imminent or life-threatening deterioration. Critical care was time spent  personally by me on the following activities: development of treatment plan with patient and/or surrogate as well as nursing, discussions with consultants, evaluation of patient's response to treatment, examination of patient, obtaining history from patient or surrogate, ordering and performing treatments and interventions, ordering and review  of laboratory studies, ordering and review of radiographic studies, pulse oximetry and re-evaluation of patient's condition.    MDM   Final diagnoses:  Atrial flutter, unspecified type ([place])  Hypotension, unspecified hypotension type    Patient sent over from GI suite. Patient underwent sedation for upper endoscopy. Patient when she checked in her blood pressure was systolic 983. During the entire procedure and after a patient's blood pressures were in the 70s. Patient has received over 3 L of fluid blood pressures are more around 80 systolic. Patient still somewhat sedated but will wake up and answer questions. Patient's heart rate does show evidence of atrial flutter she was not known to have that rhythm before initially during some of the procedure time heart rate got up to as high as 131. EKG on presentation here heart rate was 110. Currently heart rate is in the 85-95 range.  Patient is completely asymptomatic other than feeling sedated. We'll try some Narcan. Patient does take a long-acting beta blocker this could be playing a role. Patient states that her blood pressure is normally fairly low. And has wheezes noted she started out with a systolic of 382. The 3 L plus of fluid has made no real significant difference. Patient lactic acid is normal no evidence of serious infection based on that chest x-ray had no acute findings. Labs include showed no leukocytosis no significant anemia basic chemistries without any significant abnormalities.  Will also give a trial of Narcan to see if it makes any difference since she did receive sedating meds but also could  be a benzodiazepine as the cause. Troponin still pending. It was ran but it did not cross over and the batteries died so may have to be repeated.   Will contact hospitalist for admission. Patient's troponin is still pending. The duration of the atrial flutter is unknown as the patient was not able to tell she was having palpitations.   Clinically I suspected patient still showing signs of infection from the sedation for the upper endoscopy. Also I think patient's blood pressures are fairly low to start with with systolics at least starting this morning around 101. Family member states that patient one time was told not to take the beta blocker every day she did take all of her antihypertensive meds last evening. Patient seems to be not bothered by the low blood pressure. Has not responded to fluids. Patient's heart rate overall has improved. No known history of atrial flutter in the past so no telling how long this is been ongoing.  In addition chest x-ray does raise some question of perhaps an infiltrate. Had not taken this into account clinically I guess there could've perhaps been some aspiration that occurred with the procedure is right lower right chest area however patient certainly clinically prior to the procedure didn't have any symptoms that were consistent with pneumonia. Will discuss with the admitting team.  I personally performed the services described in this documentation, which was scribed in my presence. The recorded information has been reviewed and is accurate.      Fredia Sorrow, MD 03/13/16 [place], MD 03/13/16 [place], MD 03/13/16 1509   Addendum:  In addition to what's noted above. Patient's probably received a total of 4 L of fluid at this time. Best blood pressure systolics been 86. Slight improvement but still hypotensive. Exact cause is not clear. We'll see how she responds to Narcan. We'll start a second peripheral IV. We'll start  Neo-Synephrine.  Discussed with the hospitalist if her troponin is not significant elevated patient will be admitted to step down unit here. Also discussion about having the central line team come in in case patient ends up needing a central line later. Admitting team will arrange that.   Fredia Sorrow, MD 03/13/16 1530  The patient woke up significantly with the Narcan. However blood pressure did not really significantly improved. Patient much more awake however. We'll start the Neo-Synephrine. Troponin still pending.    Fredia Sorrow, MD 03/13/16 1541  Troponin is back and is normal. No evidence of acute cardiac event. Patient will be admitted to step down here.  Fredia Sorrow, MD 03/13/16 1555

## 2016-03-13 NOTE — Discharge Instructions (Signed)
EGD Discharge instructions Please read the instructions outlined below and refer to this sheet in the next few weeks. These discharge instructions provide you with general information on caring for yourself after you leave the hospital. Your doctor may also give you specific instructions. While your treatment has been planned according to the most current medical practices available, unavoidable complications occasionally occur. If you have any problems or questions after discharge, please call your doctor. ACTIVITY  You may resume your regular activity but move at a slower pace for the next 24 hours.   Take frequent rest periods for the next 24 hours.   Walking will help expel (get rid of) the air and reduce the bloated feeling in your abdomen.   No driving for 24 hours (because of the anesthesia (medicine) used during the test).   You may shower.   Do not sign any important legal documents or operate any machinery for 24 hours (because of the anesthesia used during the test).  NUTRITION  Drink plenty of fluids.   You may resume your normal diet.   Begin with a light meal and progress to your normal diet.   Avoid alcoholic beverages for 24 hours or as instructed by your caregiver.  MEDICATIONS  You may resume your normal medications unless your caregiver tells you otherwise.  WHAT YOU CAN EXPECT TODAY  You may experience abdominal discomfort such as a feeling of fullness or gas pains.  FOLLOW-UP  Your doctor will discuss the results of your test with you.  SEEK IMMEDIATE MEDICAL ATTENTION IF ANY OF THE FOLLOWING OCCUR:  Excessive nausea (feeling sick to your stomach) and/or vomiting.   Severe abdominal pain and distention (swelling).   Trouble swallowing.   Temperature over 101 F (37.8 C).   Rectal bleeding or vomiting of blood.    GERD and hiatal hernia information provided  Begin Protonix 40 mg daily  Office visit with [place] in 3  months      Gastroesophageal Reflux Disease, Adult Normally, food travels down the esophagus and stays in the stomach to be digested. However, when a person has gastroesophageal reflux disease (GERD), food and stomach acid move back up into the esophagus. When this happens, the esophagus becomes sore and inflamed. Over time, GERD can create small holes (ulcers) in the lining of the esophagus.  CAUSES This condition is caused by a problem with the muscle between the esophagus and the stomach (lower esophageal sphincter, or LES). Normally, the LES muscle closes after food passes through the esophagus to the stomach. When the LES is weakened or abnormal, it does not close properly, and that allows food and stomach acid to go back up into the esophagus. The LES can be weakened by certain dietary substances, medicines, and medical conditions, including:  Tobacco use.  Pregnancy.  Having a hiatal hernia.  Heavy alcohol use.  Certain foods and beverages, such as coffee, chocolate, onions, and peppermint. RISK FACTORS This condition is more likely to develop in:  People who have an increased body weight.  People who have connective tissue disorders.  People who use NSAID medicines. SYMPTOMS Symptoms of this condition include:  Heartburn.  Difficult or painful swallowing.  The feeling of having a lump in the throat.  Abitter taste in the mouth.  Bad breath.  Having a large amount of saliva.  Having an upset or bloated stomach.  Belching.  Chest pain.  Shortness of breath or wheezing.  Ongoing (chronic) cough or a night-time cough.  Wearing  away of tooth enamel.  Weight loss. Different conditions can cause chest pain. Make sure to see your health care provider if you experience chest pain. DIAGNOSIS Your health care provider will take a medical history and perform a physical exam. To determine if you have mild or severe GERD, your health care provider may also monitor  how you respond to treatment. You may also have other tests, including:  An endoscopy toexamine your stomach and esophagus with a small camera.  A test thatmeasures the acidity level in your esophagus.  A test thatmeasures how much pressure is on your esophagus.  A barium swallow or modified barium swallow to show the shape, size, and functioning of your esophagus. TREATMENT The goal of treatment is to help relieve your symptoms and to prevent complications. Treatment for this condition may vary depending on how severe your symptoms are. Your health care provider may recommend:  Changes to your diet.  Medicine.  Surgery. HOME CARE INSTRUCTIONS Diet  Follow a diet as recommended by your health care provider. This may involve avoiding foods and drinks such as:  Coffee and tea (with or without caffeine).  Drinks that containalcohol.  Energy drinks and sports drinks.  Carbonated drinks or sodas.  Chocolate and cocoa.  Peppermint and mint flavorings.  Garlic and onions.  Horseradish.  Spicy and acidic foods, including peppers, chili powder, curry powder, vinegar, hot sauces, and barbecue sauce.  Citrus fruit juices and citrus fruits, such as oranges, lemons, and limes.  Tomato-based foods, such as red sauce, chili, salsa, and pizza with red sauce.  Fried and fatty foods, such as donuts, french fries, potato chips, and high-fat dressings.  High-fat meats, such as hot dogs and fatty cuts of red and white meats, such as rib eye steak, sausage, ham, and bacon.  High-fat dairy items, such as whole milk, butter, and cream cheese.  Eat small, frequent meals instead of large meals.  Avoid drinking large amounts of liquid with your meals.  Avoid eating meals during the 2-3 hours before bedtime.  Avoid lying down right after you eat.  Do not exercise right after you eat. General Instructions  Pay attention to any changes in your symptoms.  Take over-the-counter  and prescription medicines only as told by your health care provider. Do not take aspirin, ibuprofen, or other NSAIDs unless your health care provider told you to do so.  Do not use any tobacco products, including cigarettes, chewing tobacco, and e-cigarettes. If you need help quitting, ask your health care provider.  Wear loose-fitting clothing. Do not wear anything tight around your waist that causes pressure on your abdomen.  Raise (elevate) the head of your bed 6 inches (15cm).  Try to reduce your stress, such as with yoga or meditation. If you need help reducing stress, ask your health care provider.  If you are overweight, reduce your weight to an amount that is healthy for you. Ask your health care provider for guidance about a safe weight loss goal.  Keep all follow-up visits as told by your health care provider. This is important. SEEK MEDICAL CARE IF:  You have new symptoms.  You have unexplained weight loss.  You have difficulty swallowing, or it hurts to swallow.  You have wheezing or a persistent cough.  Your symptoms do not improve with treatment.  You have a hoarse voice. SEEK IMMEDIATE MEDICAL CARE IF:  You have pain in your arms, neck, jaw, teeth, or back.  You feel sweaty, dizzy, or light-headed.  You have chest pain or shortness of breath.  You vomit and your vomit looks like blood or coffee grounds.  You faint.  Your stool is bloody or black.  You cannot swallow, drink, or eat.   This information is not intended to replace advice given to you by your health care provider. Make sure you discuss any questions you have with your health care provider.   Document Released: 08/28/2005 Document Revised: 08/09/2015 Document Reviewed: 03/15/2015 Elsevier Interactive Patient Education 2016 [employer]   Hiatal Hernia A hiatal hernia occurs when part of your stomach slides above the muscle that separates your abdomen from your chest (diaphragm). You can be  born with a hiatal hernia (congenital), or it may develop over time. In almost all cases of hiatal hernia, only the top part of the stomach pushes through.  Many people have a hiatal hernia with no symptoms. The larger the hernia, the more likely that you will have symptoms. In some cases, a hiatal hernia allows stomach acid to flow back into the tube that carries food from your mouth to your stomach (esophagus). This may cause heartburn symptoms. Severe heartburn symptoms may mean you have developed a condition called gastroesophageal reflux disease (GERD).  CAUSES  Hiatal hernias are caused by a weakness in the opening (hiatus) where your esophagus passes through your diaphragm to attach to the upper part of your stomach. You may be born with a weakness in your hiatus, or a weakness can develop. RISK FACTORS Older age is a major risk factor for a hiatal hernia. Anything that increases pressure on your diaphragm can also increase your risk of a hiatal hernia. This includes:  Pregnancy.  Excess weight.  Frequent constipation. SIGNS AND SYMPTOMS  People with a hiatal hernia often have no symptoms. If symptoms develop, they are almost always caused by GERD. They may include:  Heartburn.  Belching.  Indigestion.  Trouble swallowing.  Coughing or wheezing.  Sore throat.  Hoarseness.  Chest pain. DIAGNOSIS  A hiatal hernia is sometimes found during an exam for another problem. Your health care provider may suspect a hiatal hernia if you have symptoms of GERD. Tests may be done to diagnose GERD. These may include:  X-rays of your stomach or chest.  An upper gastrointestinal (GI) series. This is an X-ray exam of your GI tract involving the use of a chalky liquid that you swallow. The liquid shows up clearly on the X-ray.  Endoscopy. This is a procedure to look into your stomach using a thin, flexible tube that has a tiny camera and light on the end of it. TREATMENT  If you have no  symptoms, you may not need treatment. If you have symptoms, treatment may include:  Dietary and lifestyle changes to help reduce GERD symptoms.  Medicines. These may include:  Over-the-counter antacids.  Medicines that make your stomach empty more quickly.  Medicines that block the production of stomach acid (H2 blockers).  Stronger medicines to reduce stomach acid (proton pump inhibitors).  You may need surgery to repair the hernia if other treatments are not helping. HOME CARE INSTRUCTIONS   Take all medicines as directed by your health care provider.  Quit smoking, if you smoke.  Try to achieve and maintain a healthy body weight.  Eat frequent small meals instead of three large meals a day. This keeps your stomach from getting too full.  Eat slowly.  Do not lie down right after eating.  Do noteat 1-2 hours before bed.  Do not drink beverages with caffeine. These include cola, coffee, cocoa, and tea.  Do not drink alcohol.  Avoid foods that can make symptoms of GERD worse. These may include:  Fatty foods.  Citrus fruits.  Other foods and drinks that contain acid.  Avoid putting pressure on your belly. Anything that puts pressure on your belly increases the amount of acid that may be pushed up into your esophagus.   Avoid bending over, especially after eating.  Raise the head of your bed by putting blocks under the legs. This keeps your head and esophagus higher than your stomach.  Do not wear tight clothing around your chest or stomach.  Try not to strain when having a bowel movement, when urinating, or when lifting heavy objects. SEEK MEDICAL CARE IF:  Your symptoms are not controlled with medicines or lifestyle changes.  You are having trouble swallowing.  You have coughing or wheezing that will not go away. SEEK IMMEDIATE MEDICAL CARE IF:  Your pain is getting worse.  Your pain spreads to your arms, neck, jaw, teeth, or back.  You have  shortness of breath.  You sweat for no reason.  You feel sick to your stomach (nauseous) or vomit.  You vomit blood.  You have bright red blood in your stools.  You have black, tarry stools.    This information is not intended to replace advice given to you by your health care provider. Make sure you discuss any questions you have with your health care provider.   Document Released: 02/08/2004 Document Revised: 12/09/2014 Document Reviewed: 11/05/2013 Elsevier Interactive Patient Education [employer].

## 2016-03-13 NOTE — ED Notes (Signed)
Pt sent from Endo.  Staff reports pt had endoscopy and had esophagus dilated and reports post op pt's rhythm was a fib/flutter and denies history of either.  Endo staff performed an ekg and it read Stemi so pt sent here for further eval.

## 2016-03-13 NOTE — ED Notes (Signed)
MD at bedside. 

## 2016-03-13 NOTE — Interval H&P Note (Signed)
History and Physical Interval Note:  03/13/2016 9:37 AM  Tonya Bradley  has presented today for surgery, with the diagnosis of dysphagia  The various methods of treatment have been discussed with the patient and family. After consideration of risks, benefits and other options for treatment, the patient has consented to  Procedure(s) with comments: ESOPHAGOGASTRODUODENOSCOPY (EGD) (N/A) - 0930 ESOPHAGEAL DILATION (N/A) as a surgical intervention .  The patient's history has been reviewed, patient examined, no change in status, stable for surgery.  I have reviewed the patient's chart and labs.  Questions were answered to the patient's satisfaction.     Manus Rudd  Patient seen and examined.  EGD/ED per plan.  The risks, benefits, limitations, alternatives and imponderables have been reviewed with the patient. Potential for esophageal dilation, biopsy, etc. have also been reviewed.  Questions have been answered. All parties agreeable.

## 2016-03-13 NOTE — ED Notes (Signed)
Liter bolus finished that was transfusing when brought over by ENDO.

## 2016-03-13 NOTE — H&P (View-Only) (Signed)
Primary Care Physician:  [name], NP Primary Gastroenterologist:  Dr. Gala Romney  Pre-Procedure History & Physical: HPI:  Tonya Bradley is a 65 y.o. female here for further evaluation of esophageal dysphagia. Patient describes a many year history of intermittent esophageal dysphagia particularly to meats. She states she fears going out to the steak house;   she almost always feels a piece of meat "sticks". She often must leave the table, go to the bathroom and tried to "get it out". Previously had long-standing reflux for many years but the symptoms have settled lately. She does wear dentures. She has not lost any weight;  no abdominal pain, melena, rectal bleeding. History of lymphocytic colitis diagnosed and treated through this office. Those symptoms are now in remission, if anything, she now has constipation and takes various OTC agents for that problem. She does not consume alcohol;  she is not on any acid suppression therapy.  Patient continues to smoke. History of pulmonary nodule seen on 2015 CT. 1 year follow-up CT recommended but apparently has not been done.  No prior upper GI evaluation, endoscopy, etc.  Past Medical History  Diagnosis Date  . Colon polyp   . Anxiety   . Hypercholesterolemia   . CAD (coronary artery disease)   . COPD (chronic obstructive pulmonary disease) [facility])     Past Surgical History  Procedure Laterality Date  . Cardiac catheterization    . Cholecystectomy    . Basal cell carcinoma excision    . Coronary angioplasty with stent placement    . Colonoscopy N/A 06/23/2014    Dr. Rourk:Rectal and colonic polyps-removed, Pancolonic diverticulosis. lymphocytic colitis    Prior to Admission medications   Medication Sig Start Date End Date Taking? Authorizing Provider  ALPRAZolam Duanne Moron) 1 MG tablet Take 1 tablet by mouth 3 (three) times daily. 12/10/13  Yes Historical Provider, MD  atorvastatin (LIPITOR) 40 MG tablet Take by mouth. 02/21/16 02/20/17 Yes  Historical Provider, MD  cholecalciferol (VITAMIN D) 1000 UNITS tablet Take 1,000 Units by mouth daily.   Yes Historical Provider, MD  hydrochlorothiazide (HYDRODIURIL) 25 MG tablet Take 25 mg by mouth daily as needed (swelling).    Yes Historical Provider, MD  ibuprofen (ADVIL,MOTRIN) 200 MG tablet Take 400-600 mg by mouth every 6 (six) hours as needed for fever, headache or moderate pain.   Yes Historical Provider, MD  mirtazapine (REMERON) 15 MG tablet Take 7.5 mg by mouth at bedtime.   Yes Historical Provider, MD  rOPINIRole (REQUIP) 0.25 MG tablet Take 1 tablet by mouth at bedtime as needed (restless legs).  05/31/14 05/31/18 Yes Historical Provider, MD  azithromycin (ZITHROMAX) 250 MG tablet Reported on 03/01/2016 02/21/16   Historical Provider, MD  bismuth subsalicylate (PEPTO-BISMOL) 262 MG chewable tablet Chew 3 tablets (786 mg total) by mouth 3 (three) times daily. Patient not taking: Reported on 03/01/2016 08/15/14   Orvil Feil, NP  lipase/protease/amylase (CREON) 36000 UNITS CPEP capsule Take 1 capsule (36,000 Units total) by mouth 3 (three) times daily with meals. Patient not taking: Reported on 03/01/2016 09/06/14   Orvil Feil, NP  simvastatin (ZOCOR) 40 MG tablet Take 40 mg by mouth daily. Reported on 03/01/2016    Historical Provider, MD    Allergies as of 03/01/2016  . (No Known Allergies)    Family History  Problem Relation Age of Onset  . Colon cancer Neg Hx   . Breast cancer Sister     Social History   Social History  .  Marital Status: Widowed    Spouse Name: N/A  . Number of Children: N/A  . Years of Education: N/A   Occupational History  . Not on file.   Social History Main Topics  . Smoking status: Current Every Day Smoker -- 2.00 packs/day    Types: Cigarettes  . Smokeless tobacco: Not on file  . Alcohol Use: No  . Drug Use: No  . Sexual Activity: Not on file   Other Topics Concern  . Not on file   Social History Narrative    Review of Systems: See  HPI, otherwise negative ROS  Physical Exam: BP 115/71 mmHg  Pulse 74  Temp(Src) 97.4 F (36.3 C)  Ht '5\' 2"'$  (1.575 m)  Wt 146 lb (66.225 kg)  BMI 26.70 kg/m2 General:   Alert,  Well-developed, well-nourished, pleasant and cooperative in NAD Skin:  Intact without significant lesions or rashes. Eyes:  Sclera clear, no icterus.   Conjunctiva pink. Ears:  Normal auditory acuity. Nose:  No deformity, discharge,  or lesions. Mouth:  No deformity or lesions.edentulous Neck:  Supple; no masses or thyromegaly. No significant cervical adenopathy. Lungs:  Clear throughout to auscultation.   No wheezes, crackles, or rhonchi. No acute distress. Heart:  Regular rate and rhythm; no murmurs, clicks, rubs,  or gallops. Abdomen: Non-distended, normal bowel sounds.  Soft and nontender without appreciable mass or hepatosplenomegaly.  Pulses:  Normal pulses noted. Extremities:  Without clubbing or edema.  Impression:  Pleasant 66 year old lady with dentures here now for chronic insidiously progressive esophageal dysphagia to solids. History of GERD but no such symptoms lately. She nicely describes classic "steakhouse coronary" syndrome. She likely has suffered recurrent transient food impactions. She may have a Schatzki's ring, web or stricture. Ongoing tobacco use does increase her chances for esophageal malignancy.  Pulmonary nodule needs follow-up as previously recommended.  History lymphocytic colitis in remission. Now, constipated. OTC agents she has tried thus far without satisfactory benefit. She has not tried MiraLax or Linzess.  Recommendations:  I recommended either barium esophagram or EGD in the near future. All things considered, after a discussion, we mutually agreed to proceed with an EGD initially. Will schedule an EGD with possible esophageal dilation in the near future. The risks, benefits, limitations, alternatives and imponderables have been reviewed with the patient. Potential for  esophageal dilation, biopsy, etc. have also been reviewed.  Questions have been answered. All parties agreeable.Conscious sedation. Premedication Phenergan 12.5 mg IV  Given ongoing benzodiazepine use.  Constipation information provided  Use MiraLax 1 capful daily to twice daily as needed for constipation  Schedule follow-up chest CT for pulmonary lesion seen on prior abdominal CT  Further recommendations to follow.      Notice: This dictation was prepared with Dragon dictation along with smaller phrase technology. Any transcriptional errors that result from this process are unintentional and may not be corrected upon review.

## 2016-03-13 NOTE — Progress Notes (Signed)
ANTICOAGULATION CONSULT NOTE - Initial Consult  Pharmacy Consult for lovenox Indication: atrial fibrillation  No Known Allergies  Patient Measurements: Height: '5\' 2"'$  (157.5 cm) Weight: 144 lb (65.318 kg) IBW/kg (Calculated) : 50.1   Vital Signs: Temp: 97.2 F (36.2 C) (04/12 1627) Temp Source: Oral (04/12 1627) BP: 82/57 mmHg (04/12 1715) Pulse Rate: 90 (04/12 1715)  Labs:  Recent Labs  03/13/16 1226  HGB 11.8*  HCT 35.5*  PLT 225  CREATININE 0.54    Estimated Creatinine Clearance: 61.4 mL/min (by C-G formula based on Cr of 0.54).   Medical History: Past Medical History  Diagnosis Date  . Colon polyp   . Anxiety   . Hypercholesterolemia   . CAD (coronary artery disease)   . COPD (chronic obstructive pulmonary disease) ([place])   . Atrial flutter ([place]) 03/13/2016    Medications:  Prescriptions prior to admission  Medication Sig Dispense Refill Last Dose  . ALPRAZolam (XANAX) 1 MG tablet Take 1 tablet by mouth 3 (three) times daily.   03/12/2016 at Unknown time  . atorvastatin (LIPITOR) 40 MG tablet Take by mouth.   03/12/2016 at Unknown time  . cholecalciferol (VITAMIN D) 1000 UNITS tablet Take 1,000 Units by mouth daily.   03/12/2016 at Unknown time  . escitalopram (LEXAPRO) 20 MG tablet Take 20 mg by mouth daily.   03/12/2016 at Unknown time  . hydrochlorothiazide (HYDRODIURIL) 25 MG tablet Take 25 mg by mouth daily as needed (swelling).    03/12/2016 at Unknown time  . ibuprofen (ADVIL,MOTRIN) 200 MG tablet Take 400-600 mg by mouth every 6 (six) hours as needed for fever, headache or moderate pain.   unknown  . metoprolol succinate (TOPROL XL) 25 MG 24 hr tablet Take 25 mg by mouth every other day.   03/12/2016 at 2100  . mirtazapine (REMERON) 15 MG tablet Take 7.5 mg by mouth at bedtime. Reported on 03/12/2016   03/12/2016 at Unknown time  . nitroGLYCERIN (NITROSTAT) 0.4 MG SL tablet Place 0.4 mg under the tongue every 5 (five) minutes as needed for chest pain.    unknown  . rOPINIRole (REQUIP) 0.25 MG tablet Take 3 tablets by mouth at bedtime as needed (restless legs).    unknown  . [DISCONTINUED] bismuth subsalicylate (PEPTO-BISMOL) 262 MG chewable tablet Chew 3 tablets (786 mg total) by mouth 3 (three) times daily. (Patient not taking: Reported on 03/01/2016) 270 tablet 2 Not Taking    Assessment: 65 yo lady to start lovenox for afib.  She was not on anticoagulation PTA Goal of Therapy:  Anti-Xa level 0.6-1 units/ml 4hrs after LMWH dose given Monitor platelets by anticoagulation protocol: Yes   Plan:  Lovenox '1mg'$ /kg sq q12 hours F/u CBC Monitor for bleeding complications  Thanks for allowing pharmacy to be a part of this patient's care.  Excell Seltzer, PharmD Clinical Pharmacist 03/13/2016,6:44 PM

## 2016-03-13 NOTE — Op Note (Addendum)
[facility] Patient Name: Tonya Bradley Procedure Date: 03/13/2016 8:24 AM MRN: [id number] Date of Birth: 1951-01-20 Attending MD: Norvel Richards , MD CSN: [id number] Age: 65 Admit Type: Outpatient Procedure:                Upper GI endoscopy Indications:              Dysphagia, Heartburn Providers:                Norvel Richards, MD, Lurline Del, RN, Georgeann Oppenheim, Technician Referring MD:             [facility] Medicines:                Midazolam 5 mg IV, Meperidine 25 mg IV, Ondansetron                            4 mg IV, Promethazine 79.8 mg IV Complications:            No immediate complications. Estimated Blood Loss:     Estimated blood loss: none. Estimated blood loss                            was minimal. Procedure:                Pre-Anesthesia Assessment:                           - Prior to the procedure, a History and Physical                            was performed, and patient medications and                            allergies were reviewed. The patient's tolerance of                            previous anesthesia was also reviewed. The risks                            and benefits of the procedure and the sedation                            options and risks were discussed with the patient.                            All questions were answered, and informed consent                            was obtained. Prior Anticoagulants: The patient has                            taken no previous anticoagulant or antiplatelet  agents. ASA Grade Assessment: II - A patient with                            mild systemic disease. After reviewing the risks                            and benefits, the patient was deemed in                            satisfactory condition to undergo the procedure.                           After obtaining informed consent, the endoscope was       passed under direct vision. Throughout the                            procedure, the patient's blood pressure, pulse, and                            oxygen saturations were monitored continuously. The                            EG-[id number] ([id number]) scope was introduced through the                            mouth, and advanced to the second part of duodenum.                            The upper GI endoscopy was accomplished without                            difficulty. The patient tolerated the procedure                            well. The upper GI endoscopy was accomplished                            without difficulty. The patient tolerated the                            procedure well. Scope In: 9:48:05 AM Scope Out: 10:00:31 AM Total Procedure Duration: 0 hours 12 minutes 26 seconds  Findings:      A mild Schatzki ring (acquired) was found in the distal esophagus. small       inlet patch noted. No Barrett's epithelium.      LA Grade A (one or more mucosal breaks less than 5 mm, not extending       between tops of 2 mucosal folds) esophagitis with no bleeding was found       36 to 37 cm from the incisors. The scope was withdrawn. Dilation was       performed with a Maloney dilator with no resistance at 6 Fr. . Dilation       was performed with a Maloney dilator with no resistance at 56 Fr. The  dilation site was examined following endoscope reinsertion and showed       moderate improvement in luminal narrowing. Estimated blood loss was       minimal.      A small hiatal hernia was present.      The exam was otherwise without abnormality.      The second portion of the duodenum was normal. Impression:               - Mild Schatzki ring. inlet patch present                           - LA Grade A esophagitis. Dilated.                           - Small hiatal hernia.                           - The examination was otherwise normal.                           - Normal second  portion of the duodenum.                           - No specimens collected. Moderate Sedation:      Moderate (conscious) sedation was administered by the endoscopy nurse       and supervised by the endoscopist. The following parameters were       monitored: oxygen saturation, heart rate, blood pressure, respiratory       rate, EKG, adequacy of pulmonary ventilation, and response to care.       Total physician intraservice time was 18 minutes. Recommendation:           - Patient has a contact number available for                            emergencies. The signs and symptoms of potential                            delayed complications were discussed with the                            patient. Return to normal activities tomorrow.                            Written discharge instructions were provided to the                            patient.                           - Advance diet as tolerated.                           - Continue present medications.                           - Use Protonix (pantoprazole) 40 mg PO daily today.                           -  Return to GI office in 3 months. Procedure Code(s):        --- Professional ---                           [phone], Esophagogastroduodenoscopy, flexible,                            transoral; diagnostic, including collection of                            specimen(s) by brushing or washing, when performed                            (separate procedure)                           43450, Dilation of esophagus, by unguided sound or                            bougie, single or multiple passes                           99152, Moderate sedation services provided by the                            same physician or other qualified health care                            professional performing the diagnostic or                            therapeutic service that the sedation supports,                            requiring the presence of an  independent trained                            observer to assist in the monitoring of the                            patient's level of consciousness and physiological                            status; initial 15 minutes of intraservice time,                            patient age 26 years or older Diagnosis Code(s):        --- Professional ---                           K22.2, Esophageal obstruction                           K44.9, Diaphragmatic hernia without obstruction or  gangrene                           R13.10, Dysphagia, unspecified                           R12, Heartburn CPT copyright 2016 [employer]. All rights reserved. The codes documented in this report are preliminary and upon coder review may  be revised to meet current compliance requirements. Cristopher Estimable. Alaa Eyerman, MD Norvel Richards, MD 03/13/2016 10:15:05 AM This report has been signed electronically. Number of Addenda: 1 Addendum Number: 1   Addendum Date: 03/13/2016 10:53:01 AM      In post procedure, patient was noted to have an irregular heart rhythm.       A 12-lead EKG was obtained. this was abnormal indicating atrial       fibrillation and ST segment depression. No history of same. Patient       without any cardiac symptoms.      I instructed the staff to take the patient to the emergency department       for further evaluation. Cristopher Estimable. Harout Scheurich, MD Norvel Richards, MD 03/13/2016 10:55:49 AM This report has been signed electronically.

## 2016-03-13 NOTE — ED Notes (Signed)
Patient will respond to voice but still is drowsy from endoscopy sedation.

## 2016-03-13 NOTE — ED Notes (Addendum)
Friend at bedside.

## 2016-03-14 ENCOUNTER — Encounter (HOSPITAL_COMMUNITY): Payer: Self-pay | Admitting: Adult Health

## 2016-03-14 ENCOUNTER — Observation Stay (HOSPITAL_COMMUNITY): Payer: Medicare HMO

## 2016-03-14 ENCOUNTER — Observation Stay (HOSPITAL_BASED_OUTPATIENT_CLINIC_OR_DEPARTMENT_OTHER): Payer: Medicare HMO

## 2016-03-14 DIAGNOSIS — I959 Hypotension, unspecified: Secondary | ICD-10-CM

## 2016-03-14 DIAGNOSIS — T50995A Adverse effect of other drugs, medicaments and biological substances, initial encounter: Secondary | ICD-10-CM | POA: Diagnosis present

## 2016-03-14 DIAGNOSIS — I4892 Unspecified atrial flutter: Secondary | ICD-10-CM

## 2016-03-14 DIAGNOSIS — Z85828 Personal history of other malignant neoplasm of skin: Secondary | ICD-10-CM | POA: Diagnosis not present

## 2016-03-14 DIAGNOSIS — R0902 Hypoxemia: Secondary | ICD-10-CM | POA: Diagnosis present

## 2016-03-14 DIAGNOSIS — Z955 Presence of coronary angioplasty implant and graft: Secondary | ICD-10-CM | POA: Diagnosis not present

## 2016-03-14 DIAGNOSIS — J690 Pneumonitis due to inhalation of food and vomit: Secondary | ICD-10-CM

## 2016-03-14 DIAGNOSIS — I952 Hypotension due to drugs: Secondary | ICD-10-CM | POA: Diagnosis present

## 2016-03-14 DIAGNOSIS — K222 Esophageal obstruction: Secondary | ICD-10-CM | POA: Diagnosis present

## 2016-03-14 DIAGNOSIS — K209 Esophagitis, unspecified: Secondary | ICD-10-CM

## 2016-03-14 DIAGNOSIS — Z803 Family history of malignant neoplasm of breast: Secondary | ICD-10-CM | POA: Diagnosis not present

## 2016-03-14 DIAGNOSIS — J449 Chronic obstructive pulmonary disease, unspecified: Secondary | ICD-10-CM | POA: Diagnosis present

## 2016-03-14 DIAGNOSIS — R1310 Dysphagia, unspecified: Secondary | ICD-10-CM | POA: Diagnosis present

## 2016-03-14 DIAGNOSIS — Z86010 Personal history of colonic polyps: Secondary | ICD-10-CM | POA: Diagnosis not present

## 2016-03-14 DIAGNOSIS — K449 Diaphragmatic hernia without obstruction or gangrene: Secondary | ICD-10-CM | POA: Diagnosis present

## 2016-03-14 DIAGNOSIS — I2510 Atherosclerotic heart disease of native coronary artery without angina pectoris: Secondary | ICD-10-CM | POA: Diagnosis present

## 2016-03-14 DIAGNOSIS — F17210 Nicotine dependence, cigarettes, uncomplicated: Secondary | ICD-10-CM | POA: Diagnosis present

## 2016-03-14 LAB — BASIC METABOLIC PANEL
ANION GAP: 7 (ref 5–15)
BUN: 9 mg/dL (ref 6–20)
CALCIUM: 7.6 mg/dL — AB (ref 8.9–10.3)
CO2: 22 mmol/L (ref 22–32)
CREATININE: 0.52 mg/dL (ref 0.44–1.00)
Chloride: 113 mmol/L — ABNORMAL HIGH (ref 101–111)
GFR calc Af Amer: >60 mL/min (ref >60)
GLUCOSE: 116 mg/dL — AB (ref 65–99)
Potassium: 3.3 mmol/L — ABNORMAL LOW (ref 3.5–5.1)
Sodium: 142 mmol/L (ref 135–145)

## 2016-03-14 LAB — CBC
HCT: 34.6 % — ABNORMAL LOW (ref 36.0–46.0)
HEMOGLOBIN: 11.5 g/dL — AB (ref 12.0–15.0)
MCH: 31.9 pg (ref 26.0–34.0)
MCHC: 33.2 g/dL (ref 30.0–36.0)
MCV: 95.8 fL (ref 78.0–100.0)
PLATELETS: 276 10*3/uL (ref 150–400)
RBC: 3.61 MIL/uL — AB (ref 3.87–5.11)
RDW: 14.1 % (ref 11.5–15.5)
WBC: 7.7 10*3/uL (ref 4.0–10.5)

## 2016-03-14 LAB — ECHOCARDIOGRAM COMPLETE
Height: 62 in
WEIGHTICAEL: 2574.97 [oz_av]

## 2016-03-14 LAB — TROPONIN I

## 2016-03-14 LAB — MAGNESIUM: MAGNESIUM: 1.7 mg/dL (ref 1.7–2.4)

## 2016-03-14 MED ORDER — AMIODARONE HCL IN DEXTROSE 360-4.14 MG/200ML-% IV SOLN
60.0000 mg/h | INTRAVENOUS | Status: AC
  Administered 2016-03-14: 60 mg/h via INTRAVENOUS
  Filled 2016-03-14: qty 200

## 2016-03-14 MED ORDER — PHENYLEPHRINE HCL 10 MG/ML IJ SOLN
INTRAMUSCULAR | Status: AC
  Filled 2016-03-14: qty 2

## 2016-03-14 MED ORDER — POTASSIUM CHLORIDE CRYS ER 20 MEQ PO TBCR
40.0000 meq | EXTENDED_RELEASE_TABLET | Freq: Once | ORAL | Status: AC
  Administered 2016-03-14: 40 meq via ORAL
  Filled 2016-03-14: qty 2

## 2016-03-14 MED ORDER — DEXTROSE 5 % IV SOLN
0.0000 ug/min | INTRAVENOUS | Status: DC
  Administered 2016-03-14: 10 ug/min via INTRAVENOUS
  Filled 2016-03-14 (×2): qty 1

## 2016-03-14 MED ORDER — AMOXICILLIN-POT CLAVULANATE 875-125 MG PO TABS
1.0000 | ORAL_TABLET | Freq: Two times a day (BID) | ORAL | Status: DC
  Administered 2016-03-15 (×2): 1 via ORAL
  Filled 2016-03-14 (×9): qty 1

## 2016-03-14 MED ORDER — AMIODARONE HCL IN DEXTROSE 360-4.14 MG/200ML-% IV SOLN
30.0000 mg/h | INTRAVENOUS | Status: DC
  Administered 2016-03-14 – 2016-03-15 (×3): 30 mg/h via INTRAVENOUS
  Filled 2016-03-14 (×2): qty 200

## 2016-03-14 MED ORDER — NICOTINE 21 MG/24HR TD PT24
21.0000 mg | MEDICATED_PATCH | Freq: Every day | TRANSDERMAL | Status: DC
  Administered 2016-03-14 – 2016-03-15 (×2): 21 mg via TRANSDERMAL
  Filled 2016-03-14 (×2): qty 1

## 2016-03-14 MED ORDER — ATORVASTATIN CALCIUM 40 MG PO TABS
40.0000 mg | ORAL_TABLET | Freq: Every day | ORAL | Status: DC
  Administered 2016-03-14: 40 mg via ORAL
  Filled 2016-03-14: qty 1

## 2016-03-14 NOTE — Consult Note (Signed)
CARDIOLOGY CONSULT NOTE   Patient ID: Tonya Bradley MRN: [id number] DOB/AGE: 1951/10/01 65 y.o.  Admit Date: 03/13/2016 Referring Physician: Emelda Brothers MD Primary Physician: [name], NP Consulting Cardiologist: Carlyle Dolly MD Primary Cardiologist:  Tawanna Solo Reason for Consultation: Atrial flutter  Clinical Summary Tonya Bradley is a 65 y.o.female admitted with hypotension and atrial flutter after having EGD with dilation. After the procedure she became hypotensive and was found to be in atrial flutter 2:1 and 3:1 conduction, HR 130 bpm.  She was treated with IV fluids and neo-synepherine. She was asymptomatic. Admitted for further evaluation.   She states that she has been seen in the past by [facility], Dr. Hamilton Capri, in [place] and had a stress test. She also states that she had a cardiac cath in 2014. (Care Everywhere is being accessed and awaiting download for review). She states that she had blockages but does not remember details. She was placed on BB, but could not tolerate due to hypotension.  She has not been followed regularly. She denies chest pain or weakness but states that she has not had a lot of energy over the last year.   On arrival to ER, BP 101/6, HR 110 bpm, atrial flutter with 2:1 and 3:1 conduction. She was afebrile with O2 Sat of 93%. Potassium 3.5, Creatinine 0.54, Hgb 11.8, Hct 35.5.Troponin 0.01. CXR abnormal with asymmetric density at the lateral lower right chest.  She was treated with IV fluid bolus, Narcan, and placed on Neo-synephrine gtt. She remains on gtt and BP is improved. Atrial flutter remains prominent but rate is better controlled in the 80's- 90's   No Known Allergies  Medications Scheduled Medications: . enoxaparin (LOVENOX) injection  1 mg/kg Subcutaneous Q12H  . escitalopram  20 mg Oral Daily  . mirtazapine  7.5 mg Oral QHS  . pantoprazole  40 mg Oral Daily  . potassium chloride  40 mEq Oral Once    Infusions: .  phenylephrine (NEO-SYNEPHRINE) Adult infusion 20 mcg/min (03/14/16 0646)    PRN Medications: acetaminophen, ALPRAZolam, ibuprofen, nitroGLYCERIN, ondansetron (ZOFRAN) IV, rOPINIRole   Past Medical History  Diagnosis Date  . Colon polyp   . Anxiety   . Hypercholesterolemia   . CAD (coronary artery disease)   . COPD (chronic obstructive pulmonary disease) ([place])   . Atrial flutter ([place]) 03/13/2016    Past Surgical History  Procedure Laterality Date  . Cholecystectomy    . Basal cell carcinoma excision    . Coronary angioplasty with stent placement    . Colonoscopy N/A 06/23/2014    Dr. Rourk:Rectal and colonic polyps-removed, Pancolonic diverticulosis. lymphocytic colitis  . Cardiac catheterization      Family History  Problem Relation Age of Onset  . Colon cancer Neg Hx   . Breast cancer Sister     Social History Tonya Bradley reports that she has been smoking Cigarettes.  She has been smoking about 2.00 packs per day. She does not have any smokeless tobacco history on file. Tonya Bradley reports that she does not drink alcohol.  Review of Systems Complete review of systems are found to be negative unless outlined in H&P above.  Physical Examination Blood pressure 100/64, pulse 74, temperature 97.9 F (36.6 C), temperature source Oral, resp. rate 20, height '5\' 2"'$  (1.575 m), weight 160 lb 15 oz (73 kg), SpO2 93 %.  Intake/Output Summary (Last 24 hours) at 03/14/16 0740 Last data filed at 03/14/16 0734  Gross per 24 hour  Intake 2401.83 ml  Output  900 ml  Net 1501.83 ml    Telemetry:Atrial flutter with 2:1 and 3:1 AV block  GEN: No acute distress.  HEENT: Conjunctiva and lids normal, oropharynx clear with moist mucosa. Neck: Supple, no elevated JVP or carotid bruits, no thyromegaly. Lungs: Diminished in the bases without wheezes or coughing.  Cardiac: Regular rate and rhythm, no S3 or significant systolic murmur, no pericardial rub. Abdomen: Soft, nontender, no  hepatomegaly, bowel sounds present, no guarding or rebound. Extremities: No pitting edema, distal pulses 2+. Skin: Warm and dry. Musculoskeletal: No kyphosis. Neuropsychiatric: Alert and oriented x3, affect grossly appropriate.  Prior Cardiac Testing/Procedures See at [facility] with cardiac cath, stress test and echo. Awaiting Care Everywhere download.  Lab Results  Basic Metabolic Panel:  Recent Labs Lab 03/07/16 0900 03/13/16 1226 03/14/16 0256  NA  --  144 142  K  --  3.5 3.3*  CL  --  111 113*  CO2  --  27 22  GLUCOSE  --  103* 116*  BUN  --  8 9  CREATININE 0.70 0.54 0.52  CALCIUM  --  7.7* 7.6*    Liver Function Tests:  Recent Labs Lab 03/13/16 1226  AST 20  ALT 19  ALKPHOS 67  BILITOT 0.3  PROT 5.5*  ALBUMIN 3.0*    CBC:  Recent Labs Lab 03/13/16 1226 03/14/16 0256  WBC 6.8 7.7  NEUTROABS 3.3  --   HGB 11.8* 11.5*  HCT 35.5* 34.6*  MCV 94.9 95.8  PLT 225 276    Cardiac Enzymes:  Recent Labs Lab 03/13/16 2124 03/14/16 0256  TROPONINI <0.03 <0.03    Radiology: Dg Chest Port 1 View  03/13/2016  CLINICAL DATA:  Atrial fibrillation, heart fluttering, palpitations, coronary artery disease, COPD, smoker EXAM: PORTABLE CHEST 1 VIEW COMPARISON:  Portable exam 1222 hours compared to 12/16/2013 correlated with CT chest of 03/07/2016 FINDINGS: Enlargement of cardiac silhouette. Mediastinal contours and pulmonary vascularity normal. Hazy density at lateral lower RIGHT lung may be related to asymmetric chest wall soft tissues but unable to completely exclude infiltrate. Remaining lungs clear. No pleural effusion or pneumothorax. Atherosclerotic calcifications aorta noted. Bones demineralized. IMPRESSION: Asymmetric density at lateral lower RIGHT chest, question due to superimposed asymmetric soft tissues though cannot completely exclude peripheral infiltrate at lower RIGHT lung. This finding is new since previous exams. Electronically Signed   By: Lavonia Dana  M.D.   On: 03/13/2016 12:55     IEP:PIRJJO flutter with PVC;s, rate of 110 bpm, Non-specific T-wave abnormalities.    Impression and Recommendations  1. Atrial flutter with RVR: Heart rate is better controlled with IV fluids, but remains in atrial flutter in the 80's and 90'.s without rate control or antiarrhythmics. TSH 1.028. CHADS VASC Score of 3. Currently on LMWH. Will order echo for evaluation of Lv fx. May consider DCCV if she does not convert on her own. Does not tolerate BB due to hypotensive response but has been placed on this by former cardiologist. She takes it QOD.. Consider adding digoxin for pharmacologic conversion, vs amiodarone.   2. Hypotension: BP is soft and remains on neo-synephrine gtt. Will tray to wean. She is not anemic. Will try to wean neo-synephrine. Creatinine is 0.52 arguing against dehydration. Await echo for LV function.   3. CAD: Self reported and listed in office notes of PCP via [place]. Documentation from [facility] states that she has a stent per cardiac cath in 2011, and it was found to be patent. No details are found  in notes of procedure.   4. Dysphagia: S/P EDG with esophageal dilation.  5. Hypercholesterolemia: Restart atorvastatin.      Signed: Phill Myron. Lawrence NP [place]  03/14/2016, 7:40 AM Co-Sign MD  Patient seen and discussed with NP Lawerence, I agree with her documentation above. 65 yo female history of CAD with prior stent in [place], cath 2012 with patent coronaries, COPD, [facility] admitted after elective EGD and dilatation after developing aflutter and RVR and hypotension. She denies any prior diagnosis of aflutter. She denies any palpitations at any time, including yesterday with the onset of her aflutter. Based on lack of symptoms the true duration is unclear. She also reports problems with low bp's, to where she can only taker her Toprol every other day.     TSH 1, Hgb 11.8, Plt 225, K 3.5, lactic acid 1.6, trop neg x3, Mg 1.7 CXR:  asymmetric density right lower chest EKG aflutter rate 110  New diagnosis of aflutter, she is asymptomatic and thus the true duration is unclear. She has had issues with hypotension on beta blockers in the past, and had hypotension postprocedure requiring neosynephrine. Currently on very low dose neo at 10. With her history of CAD this also limits antiarrhythmic options. For the short term will start amio gtt without bolus due to hypotension, if she cannot tolerate IV load then change to oral as the urgency of loading is not high given she currently is rate controlled. CHADS2Vasc of 3 (age x1, gender, CAD), she is currently on lovenox, we will convert this to eliquis starting tomorrow. Wean neo as tolerated. F/u echo.     Zandra Abts MD

## 2016-03-14 NOTE — Care Management Obs Status (Signed)
[place] NOTIFICATION   Patient Details  Name: Brianda Beitler MRN: [id number] Date of Birth: [birth year]   Medicare Observation Status Notification Given:  Yes    Alvie Heidelberg, RN 03/14/2016, 12:26 PM

## 2016-03-14 NOTE — Progress Notes (Signed)
PROGRESS NOTE  Tonya Bradley HER:[id number] DOB: [birth year] DOA: 03/13/2016 PCP: [name], NP  Summary: 6 yof presented from endoscopy where she had an outpatient EGD done for intraoperative hypotension and atrial flutter. She was treated with IV fluids but remained hypotensive. Testing was unremarkable and initial presentation was felt to be related to procedural medications. Pt is compliant with her meds and has felt well. Per discussion with her gastroenterologist, she underwent an uneventful EGD on 4/12, except for intraoperative hypotension. Postoperatively she was found to have atrial flutter. EKG was concerning for STEMI, but this was r/o'd. In ED, despite fluids, she remained hypotensive, and was admitted for further management.  Assessment/Plan: 1. Atrial flutter, new onset, asymptomatic. Remains stable. Rate controlled. Not clear whether present prior to procedure. May be a phenomenon related to procedure medications. CHA2DS2-VASc = 3. Cardiology consultation pending. 2. Hypotension intra-and post-procedure. Completely asymptomatic, much improved, weaning off vasopressor. Lactic acid and troponin within normal limits. No signs or symptoms to suggest infection or ACS. Suspect related to intraoperative medications. On long-acting beta-blocker, last taken 4/11 in the evening. Of note this has been decreased to every other day because of her chronic low blood pressure.  3. Abnormal chest x-ray, no pulmonary symptoms. CT chest recently was unremarkable. While aspiration would be possible it does not appear likely based on her clinical appearance and exam. 4. CAD, quiescent.  5. Esophagitis. Protonix daily. Follow-up with GI in 3 months. 6. COPD with nocturnal hypoxia. Appears stable.   Doing well. Stop vasopressor. Follow up cardio recs.  Will repeat CXR later today.  Anticipate discharge later today if cleared by cardiology.  Code Status: full DVT prophylaxis: Lovenox Family  Communication: discussed with patient Disposition Plan: Home  Murray Hodgkins, MD  [facility] Direct contact:  --Via amion app OR  --[email or web address]; password [email or web address] and click  [email or web address] contact night coverage as above 03/14/2016, 6:08 AM  LOS: 1 day   Consultants:  Cardio  Procedures:  none  Antibiotics:  none  HPI/Subjective: Feels well. No CP or trouble breathing.   Objective: Filed Vitals:   03/14/16 0400 03/14/16 0430 03/14/16 0500 03/14/16 0530  BP: 1'28/78 96/70 93/57 '$ 105/65  Pulse: 71 73 74 75  Temp:      TempSrc:      Resp: '16 23 21 20  '$ Height:      Weight:      SpO2: 93% 93% 94% 96%    Intake/Output Summary (Last 24 hours) at 03/14/16 0608 Last data filed at 03/14/16 0500  Gross per 24 hour  Intake 2401.83 ml  Output    300 ml  Net 2101.83 ml     Filed Weights   03/13/16 1050  Weight: 65.318 kg (144 lb)    Exam:    General:  Appears calm and comfortable Cardiovascular: RRR, no m/r/g. No LE edema. Telemetry: atrial flutter Respiratory: CTA bilaterally, no w/r/r. Normal respiratory effort. Psychiatric: grossly normal mood and affect, speech fluent and appropriate Neurologic: grossly non-focal.   New data reviewed:  Potassium 3.3  Chloride 113  BUN 9  Cr 0.52  Glucose 116  Troponin <0.03  Hgb 11.5, stable  BMP unremarkable.  UA unremarkable  Pending data:  ECHO  Scheduled Meds: . enoxaparin (LOVENOX) injection  1 mg/kg Subcutaneous Q12H  . escitalopram  20 mg Oral Daily  . mirtazapine  7.5 mg Oral QHS  . pantoprazole  40 mg Oral Daily   Continuous Infusions: . phenylephrine (NEO-SYNEPHRINE) Adult infusion 30 mcg/min (03/14/16 0500)  Principal Problem:   Hypotension Active Problems:   Atrial flutter ([place])      By signing my name below, I, Delene Ruffini, attest that this documentation has been prepared under the direction and in the presence of Jahari Wiginton P. Sarajane Jews, MD. Electronically Signed: Delene Ruffini, Scribe.  03/14/2016 9:07am  I personally performed the services described in this documentation. All medical record entries made by the scribe were at my direction. I have reviewed the chart and agree that the record reflects my personal performance and is accurate and complete. Murray Hodgkins, MD

## 2016-03-14 NOTE — Discharge Summary (Addendum)
Physician Discharge Summary  Tonya Bradley URK:[id number] DOB: 01/07/1951 DOA: 03/13/2016  PCP: [name], NP  Admit date: 03/13/2016 Discharge date: 03/15/2016  Recommendations for Outpatient Follow-up:  1. Follow-up new diagnosis of atrial flutter, started on Eliquis 2. Follow-up resolution of pneumonia. Consider repeat chest x-ray 4-6 weeks to document resolution. Defer to PCP. 3. Follow up with GI in 3 months will be arranged by the GI office   Follow-up Information    Follow up with [name], PA-C On 04/03/2016.   Specialty:  Cardiology   Why:  1pm   Contact information:   [place] [address] [phone]       Follow up with [name], NP. Schedule an appointment as soon as possible for a visit in 2 weeks.   Specialty:  Nurse Practitioner   Contact information:   [address] [place] [address] [phone]      Discharge Diagnoses:  1. Atrial flutter 2. Hypotension 3. Aspiration Pneumonia 4. CAD 5. Esophagitis 6. COPD  Discharge Condition: improved Disposition: Discharge home.   Diet recommendation: heart healthy  Filed Weights   03/13/16 1050 03/14/16 0500 03/15/16 0500  Weight: 65.318 kg (144 lb) 73 kg (160 lb 15 oz) 73.8 kg (162 lb 11.2 oz)    History of present illness:  24 yof presented from endoscopy where she had an outpatient EGD done for intraoperative hypotension and atrial flutter. She was treated with IV fluids but remained hypotensive. Testing was unremarkable and initial presentation was felt to be related to procedural medications. Pt is compliant with her meds and has felt well. Per discussion with her gastroenterologist, she underwent an uneventful EGD on 4/12, except for intraoperative hypotension. Postoperatively she was found to have atrial flutter. EKG was concerning for STEMI, but this was r/o'd. In ED, despite fluids, she remained hypotensive, and was admitted for further management.  Hospital Course:    Is admitted to stepdown unit on low-dose vasopressor, hypotension gradually resolved and was likely related to procedural medications, complicated by outpatient beta blocker use. She remains asymptomatic. No evidence of ACS. Pt was found to have new onset atrial flutter s/p EGD, though she appeared asymptomatic. Cardiology was consulted and she was started on amiodarone and Eliquis. An ECHO was ordered to evaluate LV fx which demonstrated a normal EF with normal LA size. Repeat chest x-ray again showed infiltrates suggestive of aspiration pneumonia.  Assessment: 1. Atrial flutter, duration unclear. Asymptomatic. CHA2DS2-VASc = 3. 3218. TSH WNL. Rate-controlled. D/w cardiology. Continue amiodarone, Eliquis, follow-up as an outpatient. 2. Hypotension intra-and post-procedure. Resolved. Completely asymptomatic. Lactic acid and troponin within normal limits. No signs or symptoms to suggest infection or ACS. On long-acting beta-blocker, last taken 4/11 in the evening. Of note this has been decreased to every other day because of her chronic low blood pressure.  3. Aspiration PNA. Abnormal chest x-ray, no pulmonary symptoms. Repeat CXR showed persistent hazy opacity at the right lung base. She will be discharged with a 5 day course Augmentin, which she started 4/13 4. CAD, quiescent.  5. Esophagitis. Protonix daily. Follow-up with GI in 3 months. 6. COPD with nocturnal hypoxia. Appears stable.  Consults: Cardiology  Procedures:  Echo  Study Conclusions  - Left ventricle: The cavity size was normal. Wall thickness was  increased in a pattern of mild LVH. Systolic function was normal.  The estimated ejection fraction was in the range of 55% to 60%.  Wall motion was normal; there were no regional wall motion  abnormalities. - Aortic valve: Valve area (VTI): 2.56 cm^2. Valve area (Vmax):  2.15 cm^2. - Atrial septum: There was increased thickness of the septum,  consistent with lipomatous  hypertrophy. - Inferior vena cava: The vessel was dilated. The respirophasic  diameter changes were blunted (< 50%), consistent with elevated  central venous pressure. - Technically adequate study.   Discharge Instructions   Current Discharge Medication List    START taking these medications   Details  amiodarone (PACERONE) 200 MG tablet Take 1 tablet (200 mg total) by mouth 2 (two) times daily. Qty: 60 tablet, Refills: 0    amoxicillin-clavulanate (AUGMENTIN) 875-125 MG tablet Take 1 tablet by mouth every 12 (twelve) hours. Qty: 8 tablet, Refills: 0    apixaban (ELIQUIS) 5 MG TABS tablet Take 1 tablet (5 mg total) by mouth 2 (two) times daily. Qty: 60 tablet, Refills: 0    pantoprazole (PROTONIX) 40 MG tablet Take 1 tablet (40 mg total) by mouth daily. Qty: 30 tablet, Refills: 0      CONTINUE these medications which have NOT CHANGED   Details  ALPRAZolam (XANAX) 1 MG tablet Take 1 tablet by mouth 3 (three) times daily.    atorvastatin (LIPITOR) 40 MG tablet Take by mouth.    cholecalciferol (VITAMIN D) 1000 UNITS tablet Take 1,000 Units by mouth daily.    escitalopram (LEXAPRO) 20 MG tablet Take 20 mg by mouth daily.    mirtazapine (REMERON) 15 MG tablet Take 7.5 mg by mouth at bedtime. Reported on 03/12/2016    nitroGLYCERIN (NITROSTAT) 0.4 MG SL tablet Place 0.4 mg under the tongue every 5 (five) minutes as needed for chest pain.    rOPINIRole (REQUIP) 0.25 MG tablet Take 3 tablets by mouth at bedtime as needed (restless legs).       STOP taking these medications     hydrochlorothiazide (HYDRODIURIL) 25 MG tablet      ibuprofen (ADVIL,MOTRIN) 200 MG tablet      metoprolol succinate (TOPROL XL) 25 MG 24 hr tablet        No Known Allergies  The results of significant diagnostics from this hospitalization (including imaging, microbiology, ancillary and laboratory) are listed below for reference.    Significant Diagnostic Studies: Ct Chest W  Contrast  03/07/2016  CLINICAL DATA:  Follow-up pulmonary nodule EXAM: CT CHEST WITH CONTRAST TECHNIQUE: Multidetector CT imaging of the chest was performed during intravenous contrast administration. CONTRAST:  27m OMNIPAQUE IOHEXOL 300 MG/ML  SOLN COMPARISON:  Partial comparison is CT abdomen pelvis dated 08/18/2014 FINDINGS: Mediastinum/Nodes: Heart is normal in size. No pericardial effusion. Three vessel coronary atherosclerosis. Atherosclerotic calcifications of the aortic arch. Small mediastinal lymph nodes which do not meet pathologic CT size criteria. Visualized thyroid is unremarkable. Lungs/Pleura: Biapical pleural-parenchymal scarring. Mild subpleural scarring/ atelectasis in the posterior right lower lobe, corresponding to the prior nodular opacity. Linear scarring in the lateral left lower lobe. No focal consolidation. No suspicious pulmonary nodules. No pleural effusion or pneumothorax. Upper abdomen: Visualized upper abdomen is unremarkable. Musculoskeletal: Degenerative changes of the visualized thoracolumbar spine. IMPRESSION: No suspicious pulmonary nodules. No evidence of acute cardiopulmonary disease. Electronically Signed   By: SJulian HyM.D.   On: 03/07/2016 09:38   Dg Chest Port 1 View  03/14/2016  CLINICAL DATA:  Weakness. Inpatient. Possible right lung base opacity on chest radiograph from 1 day prior. EXAM: PORTABLE CHEST 1 VIEW COMPARISON:  Chest radiograph from one day prior. FINDINGS: Stable cardiomediastinal silhouette with top-normal heart size. No pneumothorax. No  pleural effusion. Persistent hazy opacity at the right lung base, slightly increased. No pulmonary edema. IMPRESSION: Persistent hazy opacity at the right lung base, slightly increased, worrisome for developing pneumonia. Recommend follow-up PA and lateral post treatment chest radiographs in 4-6 weeks. Electronically Signed   By: Ilona Sorrel M.D.   On: 03/14/2016 12:39   Dg Chest Port 1 View  03/13/2016   CLINICAL DATA:  Atrial fibrillation, heart fluttering, palpitations, coronary artery disease, COPD, smoker EXAM: PORTABLE CHEST 1 VIEW COMPARISON:  Portable exam 1222 hours compared to 12/16/2013 correlated with CT chest of 03/07/2016 FINDINGS: Enlargement of cardiac silhouette. Mediastinal contours and pulmonary vascularity normal. Hazy density at lateral lower RIGHT lung may be related to asymmetric chest wall soft tissues but unable to completely exclude infiltrate. Remaining lungs clear. No pleural effusion or pneumothorax. Atherosclerotic calcifications aorta noted. Bones demineralized. IMPRESSION: Asymmetric density at lateral lower RIGHT chest, question due to superimposed asymmetric soft tissues though cannot completely exclude peripheral infiltrate at lower RIGHT lung. This finding is new since previous exams. Electronically Signed   By: Lavonia Dana M.D.   On: 03/13/2016 12:55    Microbiology: Recent Results (from the past 240 hour(s))  MRSA PCR Screening     Status: None   Collection Time: 03/13/16  4:30 PM  Result Value Ref Range Status   MRSA by PCR NEGATIVE NEGATIVE Final    Comment:        The GeneXpert MRSA Assay (FDA approved for NASAL specimens only), is one component of a comprehensive MRSA colonization surveillance program. It is not intended to diagnose MRSA infection nor to guide or monitor treatment for MRSA infections.      Labs: Basic Metabolic Panel:  Recent Labs Lab 03/13/16 1226 03/14/16 0256 03/14/16 1001  NA 144 142  --   K 3.5 3.3*  --   CL 111 113*  --   CO2 27 22  --   GLUCOSE 103* 116*  --   BUN 8 9  --   CREATININE 0.54 0.52  --   CALCIUM 7.7* 7.6*  --   MG  --   --  1.7   Liver Function Tests:  Recent Labs Lab 03/13/16 1226  AST 20  ALT 19  ALKPHOS 67  BILITOT 0.3  PROT 5.5*  ALBUMIN 3.0*   CBC:  Recent Labs Lab 03/13/16 1226 03/14/16 0256  WBC 6.8 7.7  NEUTROABS 3.3  --   HGB 11.8* 11.5*  HCT 35.5* 34.6*  MCV 94.9 95.8   PLT 225 276   Cardiac Enzymes:  Recent Labs Lab 03/13/16 2124 03/14/16 0256 03/14/16 1001  TROPONINI <0.03 <0.03 <0.03    Principal Problem:   Hypotension Active Problems:   Atrial flutter ([place])   Time coordinating discharge: 35 minutes  Signed:  Murray Hodgkins, MD [facility] 03/15/2016, 12:24 PM  By signing my name below, I, Delene Ruffini, attest that this documentation has been prepared under the direction and in the presence of Daniel P. Sarajane Jews, MD. Electronically Signed: Delene Ruffini, Scribe.  03/15/2016 9:10am  I personally performed the services described in this documentation. All medical record entries made by the scribe were at my direction. I have reviewed the chart and agree that the record reflects my personal performance and is accurate and complete. Murray Hodgkins, MD

## 2016-03-14 NOTE — Care Management Note (Signed)
Case Management Note  Patient Details  Name: Tonya Bradley MRN: [id number] Date of Birth: March 29, 1951  Subjective/Objective:    Spoke with patient and significant other. Patient is alert and oriented and answers questions appropriately. From home and independent. Insured with [employer].  On [employer] drug plan.   Will need benefits check for discharge medications. No DME or O2 expected.         Action/Plan: Home with self care.   Expected Discharge Date:                  Expected Discharge Plan:  Home/Self Care  In-House Referral:     Discharge [place] not met per provider, Medication Assistance  Post Acute Care Choice:    Choice offered to:     DME Arranged:    DME Agency:     HH Arranged:    HH Agency:     Status of Service:  In process, will continue to follow  Medicare Important Message Given:    Date Medicare IM Given:    Medicare IM give by:    Date Additional Medicare IM Given:    Additional Medicare Important Message give by:     If discussed at [place] of Stay Meetings, dates discussed:    Additional Comments:  Alvie Heidelberg, RN 03/14/2016, 12:30 PM

## 2016-03-15 MED ORDER — PHENYLEPHRINE HCL 10 MG/ML IJ SOLN
INTRAMUSCULAR | Status: AC
  Filled 2016-03-15: qty 2

## 2016-03-15 MED ORDER — AMIODARONE HCL 200 MG PO TABS: 200.0000 mg | ORAL_TABLET | Freq: Two times a day (BID) | ORAL | Status: DC

## 2016-03-15 MED ORDER — APIXABAN 5 MG PO TABS: 5.0000 mg | ORAL_TABLET | Freq: Two times a day (BID) | ORAL | Status: DC

## 2016-03-15 MED ORDER — AMOXICILLIN-POT CLAVULANATE 875-125 MG PO TABS: 1.0000 | ORAL_TABLET | Freq: Two times a day (BID) | ORAL | Status: DC

## 2016-03-15 MED ORDER — AMIODARONE HCL 200 MG PO TABS
200.0000 mg | ORAL_TABLET | Freq: Two times a day (BID) | ORAL | Status: DC
Start: 2016-03-15 — End: 2016-03-15
  Administered 2016-03-15: 200 mg via ORAL
  Filled 2016-03-15: qty 1

## 2016-03-15 MED ORDER — PANTOPRAZOLE SODIUM 40 MG PO TBEC: 40.0000 mg | DELAYED_RELEASE_TABLET | Freq: Every day | ORAL | Status: DC

## 2016-03-15 NOTE — Progress Notes (Signed)
Primary Cardiologist: [facility]  Cardiology Specific Problem List: 1. Atrial flutter  2. Hypotension 3. CAD  Subjective:    Feels good and wants to go home Continues on amiodarone gtt and neo-synephrine gtt.   Objective:   Temp:  [97.1 F (36.2 C)-98.3 F (36.8 C)] 98.3 F (36.8 C) (04/14 0723) Pulse Rate:  [58-76] 70 (04/14 0723) Resp:  [19-32] 26 (04/14 0723) BP: (87-121)/(54-81) 121/76 mmHg (04/14 0700) SpO2:  [89 %-97 %] 93 % (04/14 0723) FiO2 (%):  [28 %-32 %] 28 % (04/13 2100) Weight:  [162 lb 11.2 oz (73.8 kg)] 162 lb 11.2 oz (73.8 kg) (04/14 0500)    Filed Weights   03/13/16 1050 03/14/16 0500 03/15/16 0500  Weight: 144 lb (65.318 kg) 160 lb 15 oz (73 kg) 162 lb 11.2 oz (73.8 kg)    Intake/Output Summary (Last 24 hours) at 03/15/16 0752 Last data filed at 03/15/16 0630  Gross per 24 hour  Intake 2070.72 ml  Output    500 ml  Net 1570.72 ml    Telemetry: Atrial flutter, rates in the low 100;s, some slowing overnight into the 40;s.   Exam:  General: No acute distress.  HEENT: Conjunctiva and lids normal, oropharynx clear.  Lungs:Bilateral crackles with upper airway congestion  Cardiac: No elevated JVP or bruits. RRR, no gallop or rub.   Abdomen: Normoactive bowel sounds, nontender, nondistended.  Extremities: No pitting edema, distal pulses full.  Neuropsychiatric: Alert and oriented x3, affect appropriate.   Lab Results:  Basic Metabolic Panel:  Recent Labs Lab 03/13/16 1226 03/14/16 0256 03/14/16 1001  NA 144 142  --   K 3.5 3.3*  --   CL 111 113*  --   CO2 27 22  --   GLUCOSE 103* 116*  --   BUN 8 9  --   CREATININE 0.54 0.52  --   CALCIUM 7.7* 7.6*  --   MG  --   --  1.7    Liver Function Tests:  Recent Labs Lab 03/13/16 1226  AST 20  ALT 19  ALKPHOS 67  BILITOT 0.3  PROT 5.5*  ALBUMIN 3.0*    CBC:  Recent Labs Lab 03/13/16 1226 03/14/16 0256  WBC 6.8 7.7  HGB 11.8* 11.5*  HCT 35.5* 34.6*  MCV  94.9 95.8  PLT 225 276    Cardiac Enzymes:  Recent Labs Lab 03/13/16 2124 03/14/16 0256 03/14/16 1001  TROPONINI <0.03 <0.03 <0.03   Echocardiogram: 03/14/2016 Left ventricle: The cavity size was normal. Wall thickness was  increased in a pattern of mild LVH. Systolic function was normal.  The estimated ejection fraction was in the range of 55% to 60%.  Wall motion was normal; there were no regional wall motion  abnormalities. - Aortic valve: Valve area (VTI): 2.56 cm^2. Valve area (Vmax):  2.15 cm^2. - Atrial septum: There was increased thickness of the septum,  consistent with lipomatous hypertrophy. - Inferior vena cava: The vessel was dilated. The respirophasic  diameter changes were blunted (< 50%), consistent with elevated  central venous pressure. - Technically adequate study.  Radiology: Dg Chest Port 1 View  03/14/2016  CLINICAL DATA:  Weakness. Inpatient. Possible right lung base opacity on chest radiograph from 1 day prior. EXAM: PORTABLE CHEST 1 VIEW COMPARISON:  Chest radiograph from one day prior. FINDINGS: Stable cardiomediastinal silhouette with top-normal heart size. No pneumothorax. No pleural effusion. Persistent hazy opacity at the right lung base, slightly increased. No pulmonary edema. IMPRESSION: Persistent hazy  opacity at the right lung base, slightly increased, worrisome for developing pneumonia. Recommend follow-up PA and lateral post treatment chest radiographs in 4-6 weeks. Electronically Signed   By: Ilona Sorrel M.D.   On: 03/14/2016 12:39   Dg Chest Port 1 View  03/13/2016  CLINICAL DATA:  Atrial fibrillation, heart fluttering, palpitations, coronary artery disease, COPD, smoker EXAM: PORTABLE CHEST 1 VIEW COMPARISON:  Portable exam 1222 hours compared to 12/16/2013 correlated with CT chest of 03/07/2016 FINDINGS: Enlargement of cardiac silhouette. Mediastinal contours and pulmonary vascularity normal. Hazy density at lateral lower RIGHT lung may  be related to asymmetric chest wall soft tissues but unable to completely exclude infiltrate. Remaining lungs clear. No pleural effusion or pneumothorax. Atherosclerotic calcifications aorta noted. Bones demineralized. IMPRESSION: Asymmetric density at lateral lower RIGHT chest, question due to superimposed asymmetric soft tissues though cannot completely exclude peripheral infiltrate at lower RIGHT lung. This finding is new since previous exams. Electronically Signed   By: Lavonia Dana M.D.   On: 03/13/2016 12:55      Medications:   Scheduled Medications: . amoxicillin-clavulanate  1 tablet Oral Q12H  . atorvastatin  40 mg Oral q1800  . enoxaparin (LOVENOX) injection  1 mg/kg Subcutaneous Q12H  . escitalopram  20 mg Oral Daily  . mirtazapine  7.5 mg Oral QHS  . nicotine  21 mg Transdermal Daily  . pantoprazole  40 mg Oral Daily    Infusions: . amiodarone 30 mg/hr (03/15/16 0630)  . phenylephrine (NEO-SYNEPHRINE) Adult infusion 6 mcg/min (03/15/16 0630)    PRN Medications: acetaminophen, ALPRAZolam, ibuprofen, nitroGLYCERIN, ondansetron (ZOFRAN) IV, rOPINIRole   Assessment and Plan:   1. Atrial flutter:  Heart rate is controlled. She continues on amiodarone gtt. Will transition to po after full IV loading. She is asymptomatic. Echo demonstrated normal EF with normal LA size. Will begin Eliquis 5 mg BID for CHADS VASC Score of 3. Creatinine 0.52.   2. Hypotension: Continues on neo-synephrine gtt at 6 mcg. BP soft 100/70. She is asymptomatic. Will continue to wean.   3. CAD: Followed by [facility]. Hx of DES to Cx per cath in 2012. Not on BB due to hypotension.  Phill Myron. Lawrence NP [facility]  03/15/2016, 7:52 AM   Patient seen and discussed with NP Purcell Nails, I agree with her documentation above. Started on amio gtt yesterday for aflutter in setting of soft bp's. She remains in flutter with normal rates, asymptomatic. Soft bp's still requiring some neo, we will d/c amio gtt and convert to  oral amio '200mg'$  bid for 1 month followed by '200mg'$  daily maintence. Over long term follow up can consider alternative strategy to [facility], though with her history of intermittent hypotension she is unlikely to tolerating effective doses of av nodal agents. She would like to f/u with our clinic, we will arrange f/u in 3 weeks with , at that time if still in flutter could consider DCCV. Turn off phenyephrine, if bp's remain low check with manual cuff to confirm. No cardiac etiology for low bp's, her echo shows normal LVEF with dilated IVC indicating high CVP. No evidence of sepsis, if persistent significant hypotension consider adrenal evaluation.    Would anticipate discharge later today if heart rates and bp's stable off drips   Zandra Abts MD

## 2016-03-15 NOTE — Progress Notes (Signed)
Discharge instruction reviewed with patient. Patient taken to lobby via wheelchair. No distress noted.

## 2016-03-15 NOTE — Care Management (Signed)
Benefits check on Eliquis $47 month co pay . Patient stated can afford. Free 30 day trial given.

## 2016-03-15 NOTE — Care Management Important Message (Signed)
Important Message  Patient Details  Name: Tonya Bradley MRN: [id number] Date of Birth: [birth year]   Medicare Important Message Given:  Yes    Alvie Heidelberg, RN 03/15/2016, 11:11 AM

## 2016-03-15 NOTE — Progress Notes (Signed)
PROGRESS NOTE  Tonya Bradley UXN:[id number] DOB: 26-Jul-1951 DOA: 03/13/2016 PCP: [name], NP  Summary: 22 yof presented from endoscopy where she had an outpatient EGD done for intraoperative hypotension and atrial flutter. She was treated with IV fluids but remained hypotensive. Testing was unremarkable and initial presentation was felt to be related to procedural medications. Pt is compliant with her meds and has felt well. Per discussion with her gastroenterologist, she underwent an uneventful EGD on 4/12, except for intraoperative hypotension. Postoperatively she was found to have atrial flutter. EKG was concerning for STEMI, but this was r/o'd. In ED, despite fluids, she remained hypotensive, and was admitted for further management.  Assessment/Plan: 1. Atrial flutter, duration unclear. Asymptomatic. CHA2DS2-VASc = 3. 3218. TSH WNL. Rate-controlled. D/w cardiology. 2. Hypotension intra-and post-procedure. Resolved. Completely asymptomatic. Lactic acid and troponin within normal limits. No signs or symptoms to suggest infection or ACS. On long-acting beta-blocker, last taken 4/11 in the evening. Of note this has been decreased to every other day because of her chronic low blood pressure.  3. Aspiration PNA. Abnormal chest x-ray, no pulmonary symptoms. Repeat CXR showed persistent hazy opacity at the right lung base. She will be discharged with a 5 day course Augmentin, which she started yesterday. 4. CAD, quiescent.  5. Esophagitis. Protonix daily. Follow-up with GI in 3 months. 6. COPD with nocturnal hypoxia. Appears stable.   Much improved. Discussed with Dr. Harl Bowie, plan discharge home on oral amiodarone, Eliquis, outpatient follow-up with cardiology. Discontinue beta blocker.  She will be discharged with a 5 day course of Augmentin which she was started on yesterday.   home this afternoon  Code Status: full DVT prophylaxis: Lovenox Family Communication: discussed with  patient Disposition Plan: Home  Murray Hodgkins, MD  [facility] Direct contact:  --Via amion app OR  --[email or web address]; password [email or web address] and click  [email or web address] contact night coverage as above 03/15/2016, 5:54 AM  LOS: 2 days   Consultants:  Cardio  Procedures:  Upper Endoscopy 4/12  ECHO 4/13 Study Conclusions  - Left ventricle: The cavity size was normal. Wall thickness was  increased in a pattern of mild LVH. Systolic function was normal.  The estimated ejection fraction was in the range of 55% to 60%.  Wall motion was normal; there were no regional wall motion  abnormalities. - Aortic valve: Valve area (VTI): 2.56 cm^2. Valve area (Vmax):  2.15 cm^2. - Atrial septum: There was increased thickness of the septum,  consistent with lipomatous hypertrophy. - Inferior vena cava: The vessel was dilated. The respirophasic  diameter changes were blunted (< 50%), consistent with elevated  central venous pressure. - Technically adequate study.  Antibiotics:  Augmentin 4/13 >> 4/17  HPI/Subjective: Feels better. No pain, other than a slight headache. No dizziness. Minimal cough, breathing fine.  Objective: Filed Vitals:   03/15/16 0200 03/15/16 0300 03/15/16 0400 03/15/16 0500  BP: 101/58 108/68 90/60 113/70  Pulse: 58 70 70 69  Temp:   97.1 F (36.2 C)   TempSrc:   Oral   Resp: '24 22 23 21  '$ Height:      Weight:    73.8 kg (162 lb 11.2 oz)  SpO2: 93% 95% 89% 97%    Intake/Output Summary (Last 24 hours) at 03/15/16 0554 Last data filed at 03/15/16 0400  Gross per 24 hour  Intake 1879.9 ml  Output    800 ml  Net 1079.9 ml     Filed Weights   03/13/16 1050 03/14/16 0500 03/15/16 0500  Weight:  65.318 kg (144 lb) 73 kg (160 lb 15 oz) 73.8 kg (162 lb 11.2 oz)    Exam:    General:  Appears comfortable, calm. Cardiovascular: Regular rate and rhythm, no murmur, rub or gallop. No lower extremity edema. Telemetry: atrial flutter Respiratory: Clear to  auscultation bilaterally, no wheezes, rales or rhonchi. Normal respiratory effort. Psychiatric: grossly normal mood and affect, speech fluent and appropriate  New data reviewed:  Repeat CXR noted  Scheduled Meds: . amoxicillin-clavulanate  1 tablet Oral Q12H  . atorvastatin  40 mg Oral q1800  . enoxaparin (LOVENOX) injection  1 mg/kg Subcutaneous Q12H  . escitalopram  20 mg Oral Daily  . mirtazapine  7.5 mg Oral QHS  . nicotine  21 mg Transdermal Daily  . pantoprazole  40 mg Oral Daily   Continuous Infusions: . amiodarone 30 mg/hr (03/15/16 0440)  . phenylephrine (NEO-SYNEPHRINE) Adult infusion 8 mcg/min (03/15/16 0400)    Principal Problem:   Hypotension Active Problems:   Atrial flutter ([place])      By signing my name below, I, Delene Ruffini, attest that this documentation has been prepared under the direction and in the presence of Yaretzy Olazabal P. Sarajane Jews, MD. Electronically Signed: Delene Ruffini, Scribe.  03/15/2016 9:10am  I personally performed the services described in this documentation. All medical record entries made by the scribe were at my direction. I have reviewed the chart and agree that the record reflects my personal performance and is accurate and complete. Murray Hodgkins, MD

## 2016-03-18 ENCOUNTER — Encounter (HOSPITAL_COMMUNITY): Payer: Self-pay | Admitting: Internal Medicine

## 2016-03-27 ENCOUNTER — Encounter: Payer: Medicare HMO | Admitting: Physician Assistant

## 2016-04-03 ENCOUNTER — Ambulatory Visit (INDEPENDENT_AMBULATORY_CARE_PROVIDER_SITE_OTHER): Payer: Medicare HMO | Admitting: Physician Assistant

## 2016-04-03 ENCOUNTER — Encounter: Payer: Self-pay | Admitting: Physician Assistant

## 2016-04-03 VITALS — BP 122/66 | HR 74 | Ht 62.0 in | Wt 144.0 lb

## 2016-04-03 DIAGNOSIS — I952 Hypotension due to drugs: Secondary | ICD-10-CM | POA: Diagnosis not present

## 2016-04-03 DIAGNOSIS — I2510 Atherosclerotic heart disease of native coronary artery without angina pectoris: Secondary | ICD-10-CM | POA: Insufficient documentation

## 2016-04-03 DIAGNOSIS — I4892 Unspecified atrial flutter: Secondary | ICD-10-CM | POA: Diagnosis not present

## 2016-04-03 DIAGNOSIS — R0989 Other specified symptoms and signs involving the circulatory and respiratory systems: Secondary | ICD-10-CM | POA: Insufficient documentation

## 2016-04-03 DIAGNOSIS — Z720 Tobacco use: Secondary | ICD-10-CM | POA: Insufficient documentation

## 2016-04-03 MED ORDER — AMIODARONE HCL 200 MG PO TABS: 200.0000 mg | ORAL_TABLET | Freq: Every day | ORAL | Status: DC

## 2016-04-03 NOTE — Progress Notes (Signed)
Cardiology Office Note    Date:  04/03/2016   ID:  Tonya Bradley, DOB 09/17/1951, MRN [id number]  PCP:  [name], NP  Cardiologist:  Was [facility], now Branch Chief complaint sleeping all the time  History of Present Illness:  Tonya Bradley is a 65 y.o. female patient seen by Dr. branch in the hospital. She was admitted for hypotension and atrial flutter after having EGD with dilatation. After the procedure she became hypotensive and was found to be in 2-1 and 3-1 conduction heart rate of 1 30 bpm. She was treated with IV fluids in the 06 and Afrin. She was asymptomatic.CHADS2VASC=3. Because of her hypotension she was started on oral amiodarone 200 mg twice a day for one month followed by 200 mg daily. Plan was to arrange for DC cardioversion if she remains in flutter.  Patient also has hx of CAD with prior stent in [place]X, cath 2012 with patent coronaries, COPD HLD and history of low blood pressures where she can only take her Toprol every other day.  Patient comes in today for follow-up. She was never symptomatic with the atrial fibrillation. She is in normal sinus rhythm today. She denies any chest pain, palpitations, dyspnea, dizziness or presyncope. She has chronic dyspnea on exertion and continues to smoke one half packs daily. She says she sleeps all the time but her antidepressant was doubled recently. She also had some sharp pain under her left breast that lasted about a week and now she is just a little bit tender. She also says her right hip went out.  Past Medical History  Diagnosis Date  . Colon polyp   . Anxiety   . Hypercholesterolemia   . CAD (coronary artery disease)   . COPD (chronic obstructive pulmonary disease) ([place])   . Atrial flutter ([place]) 03/13/2016    Past Surgical History  Procedure Laterality Date  . Cholecystectomy    . Basal cell carcinoma excision    . Coronary angioplasty with stent placement    . Colonoscopy N/A 06/23/2014    Dr. Rourk:Rectal  and colonic polyps-removed, Pancolonic diverticulosis. lymphocytic colitis  . Cardiac catheterization    . Esophagogastroduodenoscopy N/A 03/13/2016    Procedure: ESOPHAGOGASTRODUODENOSCOPY (EGD);  Surgeon: Daneil Dolin, MD;  [facility];  Service: Endoscopy;  Laterality: N/A;  0930  . Esophageal dilation N/A 03/13/2016    Procedure: ESOPHAGEAL DILATION;  Surgeon: Daneil Dolin, MD;  Location: [facility];  Service: Endoscopy;  Laterality: N/A;    Current Medications: Outpatient Prescriptions Prior to Visit  Medication Sig Dispense Refill  . ALPRAZolam (XANAX) 1 MG tablet Take 1 tablet by mouth 3 (three) times daily.    Marland Kitchen amiodarone (PACERONE) 200 MG tablet Take 1 tablet (200 mg total) by mouth 2 (two) times daily. 60 tablet 0  . apixaban (ELIQUIS) 5 MG TABS tablet Take 1 tablet (5 mg total) by mouth 2 (two) times daily. 60 tablet 0  . atorvastatin (LIPITOR) 40 MG tablet Take by mouth.    . cholecalciferol (VITAMIN D) 1000 UNITS tablet Take 1,000 Units by mouth daily.    Marland Kitchen escitalopram (LEXAPRO) 20 MG tablet Take 20 mg by mouth daily.    . nitroGLYCERIN (NITROSTAT) 0.4 MG SL tablet Place 0.4 mg under the tongue every 5 (five) minutes as needed for chest pain.    . pantoprazole (PROTONIX) 40 MG tablet Take 1 tablet (40 mg total) by mouth daily. 30 tablet 0  . rOPINIRole (REQUIP) 0.25 MG tablet Take  3 tablets by mouth at bedtime as needed (restless legs).     Marland Kitchen amoxicillin-clavulanate (AUGMENTIN) 875-125 MG tablet Take 1 tablet by mouth every 12 (twelve) hours. 8 tablet 0  . mirtazapine (REMERON) 15 MG tablet Take 7.5 mg by mouth at bedtime. Reported on 03/12/2016     No facility-administered medications prior to visit.     Allergies:   Review of patient's allergies indicates no known allergies.   Social History   Social History  . Marital Status: Widowed    Spouse Name: N/A  . Number of Children: N/A  . Years of Education: N/A   Social History Main Topics  . Smoking  status: Current Every Day Smoker -- 2.00 packs/day    Types: Cigarettes  . Smokeless tobacco: None  . Alcohol Use: No  . Drug Use: No  . Sexual Activity: Not Asked   Other Topics Concern  . None   Social History Narrative     Family History:  The patient's    family history includes Breast cancer in her sister; Heart attack in her father and sister; Stroke in her mother. There is no history of Colon cancer.   ROS:   Please see the history of present illness.    Review of Systems  Constitution: Positive for weakness and malaise/fatigue.  HENT: Positive for hoarse voice.   Cardiovascular: Positive for dyspnea on exertion.  Respiratory: Negative.   Hematologic/Lymphatic: Negative.   Musculoskeletal: Positive for back pain, joint pain and myalgias.  Gastrointestinal: Negative.   Genitourinary: Negative.   Neurological: Positive for excessive daytime sleepiness.  Psychiatric/Behavioral: Positive for depression. The patient is nervous/anxious.    All other systems reviewed and are negative.   PHYSICAL EXAM:   VS:  BP 122/66 mmHg  Pulse 74  Ht '5\' 2"'$  (1.575 m)  Wt 144 lb (65.318 kg)  BMI 26.33 kg/m2  SpO2 97%   GEN: Well nourished, well developed, in no acute distress,Smells of cigarette smoke  Neck: no JVD, carotid bruits, or masses Cardiac:  RRR; no murmurs, rubs, or gallops,no edema  Respiratory:  clear to auscultation bilaterally, normal work of breathing GI: soft, nontender, nondistended, + BS MS: no deformity or atrophy Skin: warm and dry, no rash Neuro:  Alert and Oriented x 3, Strength and sensation are intact Psych: euthymic mood, full affect  Wt Readings from Last 3 Encounters:  04/03/16 144 lb (65.318 kg)  03/15/16 162 lb 11.2 oz (73.8 kg)  03/13/16 146 lb (66.225 kg)      Studies/Labs Reviewed:   EKG:  EKG is ordered today.  The ekg ordered today demonstrates Normal sinus rhythm   Recent Labs: 03/13/2016: ALT 19; TSH 1.028 03/14/2016: BUN 9; Creatinine,  Ser 0.52; Hemoglobin 11.5*; Magnesium 1.7; Platelets 276; Potassium 3.3*; Sodium 142   Lipid Panel No results found for: CHOL, TRIG, HDL, CHOLHDL, VLDL, LDLCALC, LDLDIRECT  Additional studies/ records that were reviewed today include:   Echocardiogram: 03/14/2016 Left ventricle: The cavity size was normal. Wall thickness was   increased in a pattern of mild LVH. Systolic function was normal.   The estimated ejection fraction was in the range of 55% to 60%.   Wall motion was normal; there were no regional wall motion   abnormalities. - Aortic valve: Valve area (VTI): 2.56 cm^2. Valve area (Vmax):   2.15 cm^2. - Atrial septum: There was increased thickness of the septum,   consistent with lipomatous hypertrophy. - Inferior vena cava: The vessel was dilated. The respirophasic  diameter changes were blunted (< 50%), consistent with elevated   central venous pressure. - Technically adequate study.    ASSESSMENT:    1. Atrial flutter, unspecified type ([place])   2. Hypotension due to drugs   3. Right carotid bruit   4. Coronary artery disease involving native coronary artery of native heart without angina pectoris   5. Tobacco abuse      PLAN:  In order of problems listed above: Atrial flutter converted to normal sinus rhythm with amiodarone. Increase amiodarone to 200 mg once daily. Continue Eliquis 5 mg twice a day. We'll give samples and a card to reduce payment. Follow-up with Dr. branch in one month.  Patient has hypotension on beta blockers and is off them so is stable  Right carotid bruit check carotid Dopplers  CAD without angina. Continue Lipitor  Tobacco abuse smoking cessation advised  Medication Adjustments/Labs and Tests Ordered: Current medicines are reviewed at length with the patient today.  Concerns regarding medicines are outlined above.  Medication changes, Labs and Tests ordered today are listed in the Patient Instructions below. There are no Patient  Instructions on file for this visit.   Signed, [name], PA-C  04/03/2016 1:48 PM    [facility] [address], [place], [place]  [address] Phone: [phone]; Fax: [phone]

## 2016-04-03 NOTE — Patient Instructions (Signed)
Your physician recommends that you schedule a follow-up appointment in: 1 Month with Dr. Harl Bowie  Your physician has recommended you make the following change in your medication:  On May 14 Decrease Amidarone to 200 mg Daily   Your physician has requested that you have a carotid duplex. This test is an ultrasound of the carotid arteries in your neck. It looks at blood flow through these arteries that supply the brain with blood. Allow one hour for this exam. There are no restrictions or special instructions.  If you need a refill on your cardiac medications before your next appointment, please call your pharmacy.  You have been given samples of Eliquis today.   Thank you for choosing [facility]!  Smoking Cessation, Tips for Success If you are ready to quit smoking, congratulations! You have chosen to help yourself be healthier. Cigarettes bring nicotine, tar, carbon monoxide, and other irritants into your body. Your lungs, heart, and blood vessels will be able to work better without these poisons. There are many different ways to quit smoking. Nicotine gum, nicotine patches, a nicotine inhaler, or nicotine nasal spray can help with physical craving. Hypnosis, support groups, and medicines help break the habit of smoking. WHAT THINGS CAN I DO TO MAKE QUITTING EASIER?  Here are some tips to help you quit for good:  Pick a date when you will quit smoking completely. Tell all of your friends and family about your plan to quit on that date.  Do not try to slowly cut down on the number of cigarettes you are smoking. Pick a quit date and quit smoking completely starting on that day.  Throw away all cigarettes.   Clean and remove all ashtrays from your home, work, and car.  On a card, write down your reasons for quitting. Carry the card with you and read it when you get the urge to smoke.  Cleanse your body of nicotine. Drink enough water and fluids to keep your urine clear or pale  yellow. Do this after quitting to flush the nicotine from your body.  Learn to predict your moods. Do not let a bad situation be your excuse to have a cigarette. Some situations in your life might tempt you into wanting a cigarette.  Never have "just one" cigarette. It leads to wanting another and another. Remind yourself of your decision to quit.  Change habits associated with smoking. If you smoked while driving or when feeling stressed, try other activities to replace smoking. Stand up when drinking your coffee. Brush your teeth after eating. Sit in a different chair when you read the paper. Avoid alcohol while trying to quit, and try to drink fewer caffeinated beverages. Alcohol and caffeine may urge you to smoke.  Avoid foods and drinks that can trigger a desire to smoke, such as sugary or spicy foods and alcohol.  Ask people who smoke not to smoke around you.  Have something planned to do right after eating or having a cup of coffee. For example, plan to take a walk or exercise.  Try a relaxation exercise to calm you down and decrease your stress. Remember, you may be tense and nervous for the first 2 weeks after you quit, but this will pass.  Find new activities to keep your hands busy. Play with a pen, coin, or rubber band. Doodle or draw things on paper.  Brush your teeth right after eating. This will help cut down on the craving for the taste of tobacco after  meals. You can also try mouthwash.   Use oral substitutes in place of cigarettes. Try using lemon drops, carrots, cinnamon sticks, or chewing gum. Keep them handy so they are available when you have the urge to smoke.  When you have the urge to smoke, try deep breathing.  Designate your home as a nonsmoking area.  If you are a heavy smoker, ask your health care provider about a prescription for nicotine chewing gum. It can ease your withdrawal from nicotine.  Reward yourself. Set aside the cigarette money you save and buy  yourself something nice.  Look for support from others. Join a support group or smoking cessation program. Ask someone at home or at work to help you with your plan to quit smoking.  Always ask yourself, "Do I need this cigarette or is this just a reflex?" Tell yourself, "Today, I choose not to smoke," or "I do not want to smoke." You are reminding yourself of your decision to quit.  Do not replace cigarette smoking with electronic cigarettes (commonly called e-cigarettes). The safety of e-cigarettes is unknown, and some may contain harmful chemicals.  If you relapse, do not give up! Plan ahead and think about what you will do the next time you get the urge to smoke. HOW WILL I FEEL WHEN I QUIT SMOKING? You may have symptoms of withdrawal because your body is used to nicotine (the addictive substance in cigarettes). You may crave cigarettes, be irritable, feel very hungry, cough often, get headaches, or have difficulty concentrating. The withdrawal symptoms are only temporary. They are strongest when you first quit but will go away within 10-14 days. When withdrawal symptoms occur, stay in control. Think about your reasons for quitting. Remind yourself that these are signs that your body is healing and getting used to being without cigarettes. Remember that withdrawal symptoms are easier to treat than the major diseases that smoking can cause.  Even after the withdrawal is over, expect periodic urges to smoke. However, these cravings are generally short lived and will go away whether you smoke or not. Do not smoke! WHAT RESOURCES ARE AVAILABLE TO HELP ME QUIT SMOKING? Your health care provider can direct you to community resources or hospitals for support, which may include:  Group support.  Education.  Hypnosis.  Therapy.   This information is not intended to replace advice given to you by your health care provider. Make sure you discuss any questions you have with your health care  provider.   Document Released: 08/16/2004 Document Revised: 12/09/2014 Document Reviewed: 05/06/2013 Elsevier Interactive Patient Education [employer].

## 2016-04-08 ENCOUNTER — Ambulatory Visit (HOSPITAL_COMMUNITY)
Admission: RE | Admit: 2016-04-08 | Discharge: 2016-04-08 | Disposition: A | Discharge status: Home / Self Care | Payer: Medicare HMO | Source: Ambulatory Visit | Attending: Physician Assistant | Admitting: Physician Assistant

## 2016-04-08 DIAGNOSIS — R0989 Other specified symptoms and signs involving the circulatory and respiratory systems: Secondary | ICD-10-CM | POA: Diagnosis present

## 2016-04-08 DIAGNOSIS — I2510 Atherosclerotic heart disease of native coronary artery without angina pectoris: Secondary | ICD-10-CM | POA: Diagnosis not present

## 2016-04-08 DIAGNOSIS — I6522 Occlusion and stenosis of left carotid artery: Secondary | ICD-10-CM | POA: Insufficient documentation

## 2016-04-13 ENCOUNTER — Other Ambulatory Visit: Payer: Self-pay | Admitting: Cardiovascular Disease

## 2016-04-15 ENCOUNTER — Other Ambulatory Visit: Payer: Self-pay | Admitting: Physician Assistant

## 2016-04-15 NOTE — Telephone Encounter (Signed)
Confirmed pharmacy they got refill via e-script,pt made aware

## 2016-04-15 NOTE — Telephone Encounter (Signed)
Pt called and said [facility]. Did not receive her amiodarone (PACERONE) 200 MG tablet [[id number]]

## 2016-04-16 ENCOUNTER — Other Ambulatory Visit: Payer: Self-pay | Admitting: Physician Assistant

## 2016-05-14 ENCOUNTER — Telehealth: Payer: Self-pay

## 2016-05-14 NOTE — Telephone Encounter (Signed)
Tier exception for Eliquis 5 mg submitted to [facility].

## 2016-05-16 ENCOUNTER — Telehealth: Payer: Self-pay

## 2016-05-16 NOTE — Telephone Encounter (Signed)
Tier exception for Eliquis '5mg'$  approved by [facility]. Good through 12/31/217.

## 2016-06-12 ENCOUNTER — Encounter: Payer: Self-pay | Admitting: Gastroenterology

## 2016-06-12 ENCOUNTER — Ambulatory Visit (INDEPENDENT_AMBULATORY_CARE_PROVIDER_SITE_OTHER): Payer: Medicare HMO | Admitting: Gastroenterology

## 2016-06-12 VITALS — BP 117/66 | HR 71 | Temp 97.1°F | Ht 62.0 in | Wt 147.4 lb

## 2016-06-12 DIAGNOSIS — R1310 Dysphagia, unspecified: Secondary | ICD-10-CM

## 2016-06-12 DIAGNOSIS — D649 Anemia, unspecified: Secondary | ICD-10-CM | POA: Diagnosis not present

## 2016-06-12 DIAGNOSIS — K5900 Constipation, unspecified: Secondary | ICD-10-CM | POA: Diagnosis not present

## 2016-06-12 MED ORDER — PANTOPRAZOLE SODIUM 40 MG PO TBEC: 40.0000 mg | DELAYED_RELEASE_TABLET | Freq: Every day | ORAL | Status: DC

## 2016-06-12 NOTE — Assessment & Plan Note (Signed)
Schatzki ring status post dilation. LA grade A esophagitis now on PPI therapy. Improved symptoms but has had 3 episodes of food impaction requiring vomiting as outlined. Patient is apprehensive to endoscopy given recent chain of events that occurred as outlined above. Offered her barium pill esophagram to further evaluate her esophagus that she wants to place on hold and monitor her symptoms for now. Advised to chew her food thoroughly. Drink plenty of liquids when taking food and medications. Return to the office in 6 months to see Dr. Gala Romney.

## 2016-06-12 NOTE — Progress Notes (Signed)
Primary Care Physician: [name], NP  Primary Gastroenterologist:  Garfield Cornea, MD   Chief Complaint  Patient presents with  . Follow-up    nausea/ intermittent diarrhea and  constipation    HPI: Tonya Bradley is a 65 y.o. female here For follow-up. Underwent EGD back in April for dysphagia. She had a mild Schatzki ring found in the distal esophagus, small inlet patch noted. No Barrett's. LA grade a esophagitis noted. Esophagus was dilated. Started on Protonix 40 mg daily. She has noted some improvement in her swallowing but has had 3 episodes where she has had to vomit to relieve food impaction. She also has alternating constipation and diarrhea. May go 3 days without a bowel movement, then may have diarrhea for a few days up to 5-6 stools daily. Takes MiraLAX most days except for when she has diarrhea. Takes additional Dulcolax for constipation at times. Denies any blood in the stool or melena. Denies abdominal pain. No heartburn.  Of note, patient went from endoscopy to the ED for admission for intraoperative hypotension and atrial flutter. In the endoscopy suite her EKG was concerning for STEMI she was sent to the emergency department to have this ruled out. Patient was started on amiodarone and Eliquis. Course also complicated with aspiration pneumonia. Noted to have mild anemia during her hospitalization.  Current Outpatient Prescriptions  Medication Sig Dispense Refill  . ALPRAZolam (XANAX) 1 MG tablet Take 1 tablet by mouth 3 (three) times daily. Takes 2 tablets daily    . amiodarone (PACERONE) 200 MG tablet Take 1 tablet (200 mg total) by mouth daily. 30 tablet 6  . atorvastatin (LIPITOR) 40 MG tablet Take by mouth.    . cholecalciferol (VITAMIN D) 1000 UNITS tablet Take 1,000 Units by mouth daily.    Marland Kitchen ELIQUIS 5 MG TABS tablet TAKE (1) TABLET BY MOUTH TWICE DAILY. 60 tablet 3  . escitalopram (LEXAPRO) 20 MG tablet Take 20 mg by mouth daily.    . hydrochlorothiazide  (HYDRODIURIL) 25 MG tablet Take 25 mg by mouth daily as needed.    . Multiple Vitamin (MULTIVITAMIN) tablet Take 1 tablet by mouth daily.    . pantoprazole (PROTONIX) 40 MG tablet Take 1 tablet (40 mg total) by mouth daily. 90 tablet 3  . rOPINIRole (REQUIP) 0.25 MG tablet Take 3 tablets by mouth at bedtime as needed (restless legs).     . nitroGLYCERIN (NITROSTAT) 0.4 MG SL tablet Place 0.4 mg under the tongue every 5 (five) minutes as needed for chest pain. Reported on 06/12/2016     No current facility-administered medications for this visit.    Allergies as of 06/12/2016  . (No Known Allergies)   Past Medical History  Diagnosis Date  . Colon polyp   . Anxiety   . Hypercholesterolemia   . CAD (coronary artery disease)   . COPD (chronic obstructive pulmonary disease) ([place])   . Atrial flutter ([place]) 03/13/2016   Past Surgical History  Procedure Laterality Date  . Cholecystectomy    . Basal cell carcinoma excision    . Coronary angioplasty with stent placement    . Colonoscopy N/A 06/23/2014    Dr. Rourk:Rectal and colonic polyps-removed, Pancolonic diverticulosis. lymphocytic colitis  . Cardiac catheterization    . Esophagogastroduodenoscopy N/A 03/13/2016    Procedure: ESOPHAGOGASTRODUODENOSCOPY (EGD);  Surgeon: Daneil Dolin, MD;  [facility];  Service: Endoscopy;  Laterality: N/A;  0930  . Esophageal dilation N/A 03/13/2016    Procedure: ESOPHAGEAL  DILATION;  Surgeon: Daneil Dolin, MD;  [facility];  Service: Endoscopy;  Laterality: N/A;    ROS:  General: Negative for anorexia, weight loss, fever, chills, fatigue, weakness. ENT: Negative for hoarseness, difficulty swallowing , nasal congestion. CV: Negative for chest pain, angina, palpitations, dyspnea on exertion, peripheral edema.  Respiratory: Negative for dyspnea at rest, dyspnea on exertion, cough, sputum, wheezing.  GI: See history of present illness. GU:  Negative for dysuria, hematuria,  urinary incontinence, urinary frequency, nocturnal urination.  Endo: Negative for unusual weight change.    Physical Examination:   BP 117/66 mmHg  Pulse 71  Temp(Src) 97.1 F (36.2 C) (Oral)  Ht '5\' 2"'$  (1.575 m)  Wt 147 lb 6.4 oz (66.86 kg)  BMI 26.95 kg/m2  General: Well-nourished, well-developed in no acute distress.  Eyes: No icterus. Mouth: Oropharyngeal mucosa moist and pink , no lesions erythema or exudate. Lungs: Clear to auscultation bilaterally.  Heart: Regular rate and rhythm, no murmurs rubs or gallops.  Abdomen: Bowel sounds are normal, nontender, nondistended, no hepatosplenomegaly or masses, no abdominal bruits or hernia , no rebound or guarding.   Extremities: No lower extremity edema. No clubbing or deformities. Neuro: Alert and oriented x 4   Skin: Warm and dry, no jaundice.   Psych: Alert and cooperative, normal mood and affect.  Labs:  Lab Results  Component Value Date   CREATININE 0.52 03/14/2016   BUN 9 03/14/2016   NA 142 03/14/2016   K 3.3* 03/14/2016   CL 113* 03/14/2016   CO2 22 03/14/2016   Lab Results  Component Value Date   ALT 19 03/13/2016   AST 20 03/13/2016   ALKPHOS 67 03/13/2016   BILITOT 0.3 03/13/2016   Lab Results  Component Value Date   CREATININE 0.52 03/14/2016   BUN 9 03/14/2016   NA 142 03/14/2016   K 3.3* 03/14/2016   CL 113* 03/14/2016   CO2 22 03/14/2016   Lab Results  Component Value Date   WBC 7.7 03/14/2016   HGB 11.5* 03/14/2016   HCT 34.6* 03/14/2016   MCV 95.8 03/14/2016   PLT 276 03/14/2016    Imaging Studies: No results found.

## 2016-06-12 NOTE — Assessment & Plan Note (Signed)
Alternating constipation and diarrhea. Uses MiraLAX most days along with Dulcolax. Suspect mostly constipation with diarrhea related to laxative use. Simplify regimen. She will take a dose of MiraLAX at bedtime one day she has not had a good bowel movement. She will skip MiraLAX on day she has loose stools. If she does not have a bowel movement in 24 hours, she can increase her MiraLAX to twice daily until regular bowel movements again. Currently office in 6 months.

## 2016-06-12 NOTE — Patient Instructions (Signed)
1. Please have your labs done.  2. Monitor your swallowing, if you have more frequent episodes of difficulty swallowing, please call and we will schedule xray of your esophagus.  3. Make sure you chew your food thoroughly, use plenty of liquids while eating and taking your medications.  4. Please start taking your miralax at night. If you don't have a good bowel movement that day, take one dose. If you do have a good bowel movement, then you can take a dose OR not. If you have several stools or loose stools, then hold the Miralax for a day.  5. If you find that you have not had a bowel movement in 48 hours, increase your Miralax to twice a day until you have a bowel movement.

## 2016-06-12 NOTE — Progress Notes (Signed)
cc'ed to pcp °

## 2016-06-12 NOTE — Assessment & Plan Note (Signed)
Follow-up on hemoglobin.

## 2016-06-14 ENCOUNTER — Encounter: Payer: Self-pay | Admitting: Cardiology

## 2016-06-14 ENCOUNTER — Ambulatory Visit (INDEPENDENT_AMBULATORY_CARE_PROVIDER_SITE_OTHER): Payer: Medicare HMO | Admitting: Cardiology

## 2016-06-14 VITALS — BP 104/58 | HR 78 | Ht 62.0 in | Wt 146.0 lb

## 2016-06-14 DIAGNOSIS — I4892 Unspecified atrial flutter: Secondary | ICD-10-CM

## 2016-06-14 DIAGNOSIS — I2510 Atherosclerotic heart disease of native coronary artery without angina pectoris: Secondary | ICD-10-CM | POA: Diagnosis not present

## 2016-06-14 NOTE — Progress Notes (Signed)
Clinical Summary [name] is a 65 y.o.female seen today for follow up of the following medical problems.   1. Aflutter - admit 03/2016 with aflutter and RVR. Soft bp's, required amio gtt then converted to oral amio - last clinic visit 04/03/16 maintaining NSR - denies any palpitations.   2. Carotid bruit - carotid US without significant stenosis  3. CAD - history of previous stenting - no recent chest pain - cath 2012 with patent coronaries   Past Medical History  Diagnosis Date  . Colon polyp   . Anxiety   . Hypercholesterolemia   . CAD (coronary artery disease)   . COPD (chronic obstructive pulmonary disease) ([place])   . Atrial flutter ([place]) 03/13/2016     No Known Allergies   Current Outpatient Prescriptions  Medication Sig Dispense Refill  . ALPRAZolam (XANAX) 1 MG tablet Take 1 tablet by mouth 3 (three) times daily. Takes 2 tablets daily    . amiodarone (PACERONE) 200 MG tablet Take 1 tablet (200 mg total) by mouth daily. 30 tablet 6  . atorvastatin (LIPITOR) 40 MG tablet Take by mouth.    . cholecalciferol (VITAMIN D) 1000 UNITS tablet Take 1,000 Units by mouth daily.    Marland Kitchen ELIQUIS 5 MG TABS tablet TAKE (1) TABLET BY MOUTH TWICE DAILY. 60 tablet 3  . escitalopram (LEXAPRO) 20 MG tablet Take 20 mg by mouth daily.    . hydrochlorothiazide (HYDRODIURIL) 25 MG tablet Take 25 mg by mouth daily as needed.    . Multiple Vitamin (MULTIVITAMIN) tablet Take 1 tablet by mouth daily.    . nitroGLYCERIN (NITROSTAT) 0.4 MG SL tablet Place 0.4 mg under the tongue every 5 (five) minutes as needed for chest pain. Reported on 06/12/2016    . pantoprazole (PROTONIX) 40 MG tablet Take 1 tablet (40 mg total) by mouth daily. 90 tablet 3  . rOPINIRole (REQUIP) 0.25 MG tablet Take 3 tablets by mouth at bedtime as needed (restless legs).      No current facility-administered medications for this visit.     Past Surgical History  Procedure Laterality Date  . Cholecystectomy    .  Basal cell carcinoma excision    . Coronary angioplasty with stent placement    . Colonoscopy N/A 06/23/2014    Dr. Rourk:Rectal and colonic polyps-removed, Pancolonic diverticulosis. lymphocytic colitis  . Cardiac catheterization    . Esophagogastroduodenoscopy N/A 03/13/2016    Procedure: ESOPHAGOGASTRODUODENOSCOPY (EGD);  Surgeon: Daneil Dolin, MD;  [facility];  Service: Endoscopy;  Laterality: N/A;  0930  . Esophageal dilation N/A 03/13/2016    Procedure: ESOPHAGEAL DILATION;  Surgeon: Daneil Dolin, MD;  [facility];  Service: Endoscopy;  Laterality: N/A;     No Known Allergies    Family History  Problem Relation Age of Onset  . Colon cancer Neg Hx   . Breast cancer Sister   . Heart attack Father   . Stroke Mother   . Heart attack Sister      Social History [name] reports that she has been smoking Cigarettes.  She has been smoking about 2.00 packs per day. She does not have any smokeless tobacco history on file. [name] reports that she does not drink alcohol.   Review of Systems CONSTITUTIONAL: No weight loss, fever, chills, weakness or fatigue.  HEENT: Eyes: No visual loss, blurred vision, double vision or yellow sclerae.No hearing loss, sneezing, congestion, runny nose or sore throat.  SKIN: No rash  or itching.  CARDIOVASCULAR: per HPI RESPIRATORY: No shortness of breath, cough or sputum.  GASTROINTESTINAL: No anorexia, nausea, vomiting or diarrhea. No abdominal pain or blood.  GENITOURINARY: No burning on urination, no polyuria NEUROLOGICAL: No headache, dizziness, syncope, paralysis, ataxia, numbness or tingling in the extremities. No change in bowel or bladder control.  MUSCULOSKELETAL: No muscle, back pain, joint pain or stiffness.  LYMPHATICS: No enlarged nodes. No history of splenectomy.  PSYCHIATRIC: No history of depression or anxiety.  ENDOCRINOLOGIC: No reports of sweating, cold or heat intolerance. No polyuria or  polydipsia.  Marland Kitchen   Physical Examination Filed Vitals:   06/14/16 1017  BP: 104/58  Pulse: 78   Filed Vitals:   06/14/16 1017  Height: '5\' 2"'$  (1.575 m)  Weight: 146 lb (66.225 kg)    Gen: resting comfortably, no acute distress HEENT: no scleral icterus, pupils equal round and reactive, no palptable cervical adenopathy,  CV: RRR, no m/r/g, no jvd Resp: Clear to auscultation bilaterally GI: abdomen is soft, non-tender, non-distended, normal bowel sounds, no hepatosplenomegaly MSK: extremities are warm, no edema.  Skin: warm, no rash Neuro:  no focal deficits Psych: appropriate affect   Diagnostic Studies 03/2016 echo Study Conclusions  - Left ventricle: The cavity size was normal. Wall thickness was  increased in a pattern of mild LVH. Systolic function was normal.  The estimated ejection fraction was in the range of 55% to 60%.  Wall motion was normal; there were no regional wall motion  abnormalities. - Aortic valve: Valve area (VTI): 2.56 cm^2. Valve area (Vmax):  2.15 cm^2. - Atrial septum: There was increased thickness of the septum,  consistent with lipomatous hypertrophy. - Inferior vena cava: The vessel was dilated. The respirophasic  diameter changes were blunted (< 50%), consistent with elevated  central venous pressure. - Technically adequate study.    Assessment and Plan  1. Aflutter - no recent symptoms, doing well on amio. Repeat livers tests and TSH - CHADS2Vasc score is 2, continue eliquis  2. CAD - no current symptoms, continue current meds   F/u 6 months      Arnoldo Lenis, M.D.

## 2016-06-14 NOTE — Patient Instructions (Signed)
Your physician wants you to follow-up in: 6 months You will receive a reminder letter in the mail two months in advance. If you don't receive a letter, please call our office to schedule the follow-up appointment.     Your physician recommends that you continue on your current medications as directed. Please refer to the Current Medication list given to you today.     Your physician recommends that you return for lab work in: cmet,tsh      Thank you for choosing [facility] !

## 2016-06-18 LAB — CBC WITH DIFFERENTIAL/PLATELET
BASOS ABS: 89 {cells}/uL (ref 0–200)
BASOS PCT: 1 %
EOS ABS: 0 {cells}/uL — AB (ref 15–500)
Eosinophils Relative: 0 %
HEMATOCRIT: 40.6 % (ref 35.0–45.0)
HEMOGLOBIN: 14.1 g/dL (ref 11.7–15.5)
Lymphocytes Relative: 33 %
Lymphs Abs: 2937 cells/uL (ref 850–3900)
MCH: 31.1 pg (ref 27.0–33.0)
MCHC: 34.7 g/dL (ref 32.0–36.0)
MCV: 89.4 fL (ref 80.0–100.0)
MONO ABS: 712 {cells}/uL (ref 200–950)
MPV: 9.1 fL (ref 7.5–12.5)
Monocytes Relative: 8 %
NEUTROS ABS: 5162 {cells}/uL (ref 1500–7800)
Neutrophils Relative %: 58 %
Platelets: 310 10*3/uL (ref 140–400)
RBC: 4.54 MIL/uL (ref 3.80–5.10)
RDW: 14.2 % (ref 11.0–15.0)
WBC: 8.9 10*3/uL (ref 3.8–10.8)

## 2016-06-19 ENCOUNTER — Telehealth: Payer: Self-pay

## 2016-06-19 LAB — COMPREHENSIVE METABOLIC PANEL
ALK PHOS: 91 U/L (ref 33–130)
ALT: 16 U/L (ref 6–29)
AST: 15 U/L (ref 10–35)
Albumin: 4.3 g/dL (ref 3.6–5.1)
BILIRUBIN TOTAL: 0.5 mg/dL (ref 0.2–1.2)
BUN: 25 mg/dL (ref 7–25)
CO2: 31 mmol/L (ref 20–31)
Calcium: 9.4 mg/dL (ref 8.6–10.4)
Chloride: 98 mmol/L (ref 98–110)
Creat: 0.75 mg/dL (ref 0.50–0.99)
GLUCOSE: 100 mg/dL — AB (ref 65–99)
POTASSIUM: 3.5 mmol/L (ref 3.5–5.3)
Sodium: 138 mmol/L (ref 135–146)
Total Protein: 6.7 g/dL (ref 6.1–8.1)

## 2016-06-19 LAB — IRON AND TIBC
%SAT: 16 % (ref 11–50)
Iron: 51 ug/dL (ref 45–160)
TIBC: 318 ug/dL (ref 250–450)
UIBC: 267 ug/dL (ref 125–400)

## 2016-06-19 LAB — TSH: TSH: 1.68 m[IU]/L

## 2016-06-19 LAB — FERRITIN: FERRITIN: 95 ng/mL (ref 20–288)

## 2016-06-19 NOTE — Telephone Encounter (Signed)
-----   Message from Arnoldo Lenis, MD sent at 06/19/2016  4:06 PM EDT ----- Labs look good  Zandra Abts MD

## 2016-06-19 NOTE — Telephone Encounter (Signed)
lmtcb- 7/19

## 2016-06-24 NOTE — Progress Notes (Signed)
Please let patient know that her anemia has resolved!

## 2016-07-26 ENCOUNTER — Other Ambulatory Visit: Payer: Self-pay | Admitting: Physician Assistant

## 2016-11-07 ENCOUNTER — Other Ambulatory Visit: Payer: Self-pay | Admitting: Family Medicine

## 2016-11-07 ENCOUNTER — Encounter: Payer: Self-pay | Admitting: Family Medicine

## 2016-11-07 ENCOUNTER — Ambulatory Visit (INDEPENDENT_AMBULATORY_CARE_PROVIDER_SITE_OTHER): Payer: Medicare HMO | Admitting: Family Medicine

## 2016-11-07 VITALS — BP 102/58 | HR 66 | Temp 98.0°F | Resp 18 | Ht 62.0 in | Wt 155.0 lb

## 2016-11-07 DIAGNOSIS — F1320 Sedative, hypnotic or anxiolytic dependence, uncomplicated: Secondary | ICD-10-CM

## 2016-11-07 DIAGNOSIS — J439 Emphysema, unspecified: Secondary | ICD-10-CM

## 2016-11-07 DIAGNOSIS — G4736 Sleep related hypoventilation in conditions classified elsewhere: Secondary | ICD-10-CM

## 2016-11-07 DIAGNOSIS — F419 Anxiety disorder, unspecified: Secondary | ICD-10-CM | POA: Diagnosis not present

## 2016-11-07 DIAGNOSIS — J438 Other emphysema: Secondary | ICD-10-CM | POA: Diagnosis not present

## 2016-11-07 DIAGNOSIS — J9611 Chronic respiratory failure with hypoxia: Secondary | ICD-10-CM | POA: Insufficient documentation

## 2016-11-07 DIAGNOSIS — Z7689 Persons encountering health services in other specified circumstances: Secondary | ICD-10-CM

## 2016-11-07 DIAGNOSIS — R140 Abdominal distension (gaseous): Secondary | ICD-10-CM

## 2016-11-07 DIAGNOSIS — Z720 Tobacco use: Secondary | ICD-10-CM

## 2016-11-07 DIAGNOSIS — Z9181 History of falling: Secondary | ICD-10-CM

## 2016-11-07 MED ORDER — FUROSEMIDE 40 MG PO TABS: 40.0000 mg | ORAL_TABLET | Freq: Every day | ORAL | 1 refills | Status: DC

## 2016-11-07 NOTE — Progress Notes (Signed)
Chief Complaint  Patient presents with  . Establish Care   New to establish Sees cardiology for CAD and atrial flutter Sees gastroenterology for microscopic colitis and GERD Sees Derm infrequently for [facility] follow up, no current skin lesions Sees family medicine regularly.  Documented noncompliance with care. 1. Will not use home O2 in spite of documented nocturnal hypoxemia 2. Continued nicotine dependence 3. 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 Stopped inhaler medicines due to cost  NOT up to date with health screening.  No mammogram in years.  Sister died of breast cancer.  This is ordered.  Unknown last dexa - smoker and at risk osteoporosis.  Has had flu and pneumonia vaccinations, no shingles or tdaP.   Colonoscopy 2015.  Has had a fall last month and could not get up.  May need PT assessment.  Never had a vitamin D checked.  I have discussed the multiple health risks associated with cigarette smoking including, but not limited to, cardiovascular disease, lung disease and cancer.  I have strongly recommended that smoking be stopped.  I have reviewed the various methods of quitting including cold [place], classes, nicotine replacements and prescription medications.  I have offered assistance in this difficult process.  The patient is not interested in assistance at this time.  New complaint of progressive decreased appetite and increasing abdominal girth with "tight" distended abdomen.  Has pedal edema.  Takes lasix 20 mg a day.  Is advised to double.  Has chronic dyspnea, but not treating her known COPD.   Patient Active Problem List   Diagnosis Date Noted  . Other emphysema ([place]) 11/07/2016  . Chronic anxiety 11/07/2016  . Nocturnal hypoxemia due to emphysema ([place]) 11/07/2016  . Constipation 06/12/2016  . Right carotid bruit 04/03/2016  . CAD (coronary artery disease) 04/03/2016  . Tobacco abuse 04/03/2016  . Atrial flutter ([place]) 03/13/2016  . Hiatal hernia   . Reflux  esophagitis   . Schatzki's ring   . Dysphagia 03/01/2016  . Solitary pulmonary nodule 03/01/2016  . 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 Microscopic colitis 08/15/2014  . Loose stools 06/08/2014  . BCC (basal cell carcinoma), face 09/13/2012    Outpatient Encounter Prescriptions as of 11/07/2016  Medication Sig  . ALPRAZolam (XANAX) 1 MG tablet Take 1 tablet by mouth 3 (three) times daily. Takes 2 tablets daily  . amiodarone (PACERONE) 200 MG tablet Take 1 tablet (200 mg total) by mouth daily.  Marland Kitchen atorvastatin (LIPITOR) 80 MG tablet Take 80 mg by mouth daily.  . cholecalciferol (VITAMIN D) 1000 UNITS tablet Take 1,000 Units by mouth daily.  Marland Kitchen ELIQUIS 5 MG TABS tablet TAKE (1) TABLET BY MOUTH TWICE DAILY.  Marland Kitchen escitalopram (LEXAPRO) 20 MG tablet Take 20 mg by mouth daily.  . furosemide (LASIX) 20 MG tablet Take 20 mg by mouth.  . Multiple Vitamin (MULTIVITAMIN) tablet Take 1 tablet by mouth daily.  . nitroGLYCERIN (NITROSTAT) 0.4 MG SL tablet Place 0.4 mg under the tongue every 5 (five) minutes as needed for chest pain. Reported on 06/12/2016  . pantoprazole (PROTONIX) 40 MG tablet Take 1 tablet (40 mg total) by mouth daily.  Marland Kitchen rOPINIRole (REQUIP) 0.25 MG tablet Take 3 tablets by mouth at bedtime as needed (restless legs).    No facility-administered encounter medications on file as of 11/07/2016.     Past Medical History:  Diagnosis Date  . Anemia   . Anxiety   . Atrial flutter ([place]) 03/13/2016  . CAD (coronary artery disease)   .  Cancer ([place])    skin cancer  . Cataract   . Colon polyp   . COPD (chronic obstructive pulmonary disease) ([place])   . Depression   . Emphysema of lung ([place])   . GERD (gastroesophageal reflux disease)   . Hypercholesterolemia   . Osteoporosis   . Oxygen deficiency     Past Surgical History:  Procedure Laterality Date  . APPENDECTOMY    . BASAL CELL CARCINOMA EXCISION    . CARDIAC CATHETERIZATION    . CHOLECYSTECTOMY    . COLONOSCOPY N/A  06/23/2014   Dr. Rourk:Rectal and colonic polyps-removed, Pancolonic diverticulosis. lymphocytic colitis  . CORONARY ANGIOPLASTY WITH STENT PLACEMENT    . ESOPHAGEAL DILATION N/A 03/13/2016   Procedure: ESOPHAGEAL DILATION;  Surgeon: Daneil Dolin, MD;  [facility];  Service: Endoscopy;  Laterality: N/A;  . ESOPHAGOGASTRODUODENOSCOPY N/A 03/13/2016   Procedure: ESOPHAGOGASTRODUODENOSCOPY (EGD);  Surgeon: Daneil Dolin, MD;  [facility];  Service: Endoscopy;  Laterality: N/A;  0930    Social History   Social History  . Marital status: Widowed    Spouse name: N/A  . Number of children: 3  . Years of education: 10   Occupational History  . 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 Smoking status: Current Every Day Smoker    Packs/day: 2.00    Types: Cigarettes    Start date: 06/14/1966  . Smokeless tobacco: Never Used     Comment: one pack daily  . Alcohol use No  . Drug use: No  . Sexual activity: Not Currently    Birth control/ protection: None   Other Topics Concern  . Not on file   Social History Narrative   Quit high school and got married in 10th grade, age 80   First husband died of colon cancer at young age   Lives in own home   Daughter lives with her   Drinks coffee seldom, coke zero most of the time    Family History  Problem Relation Age of Onset  . Breast cancer Sister   . Cancer Sister     breast  . Heart attack Father   . Heart disease Father   . Stroke Mother   . Heart disease Mother   . Hyperlipidemia Mother   . Heart attack Sister   . Heart disease Brother     ?heart failure  . Hypertension Son   . Heart disease Maternal Grandfather   . Cancer Cousin   . Hypertension Son   . Colon cancer Neg Hx     Review of Systems  Constitutional: Positive for malaise/fatigue. Negative for weight loss.  HENT: Negative for congestion, hearing loss and sinus pain.        Denture plates  Eyes: Positive  for blurred vision.       Glasses.  Early cataracts  Respiratory: Positive for cough, sputum production and shortness of breath.   Cardiovascular: Positive for chest pain and leg swelling. Negative for palpitations, orthopnea and claudication.       Takes nitro approx 3 X a week  Gastrointestinal: Positive for abdominal pain and diarrhea. Negative for blood in stool, constipation, heartburn and vomiting.  Genitourinary: Negative for dysuria and urgency.       Mild urge incontinence  Musculoskeletal: Positive for falls.  Skin: Negative for rash.  Neurological: Positive for weakness. Negative for dizziness, focal weakness and headaches.  Psychiatric/Behavioral: Negative for memory  loss and suicidal ideas. The patient is nervous/anxious. The patient does not have insomnia.     BP (!) 102/58 (BP Location: Left Arm, Patient Position: Sitting, Cuff Size: Normal)   Pulse 66   Temp 98 F (36.7 C) (Oral)   Resp 18   Ht '5\' 2"'$  (1.575 m)   Wt 155 lb 0.6 oz (70.3 kg)   SpO2 95%   BMI 28.36 kg/m   Physical Exam  Constitutional: She is oriented to person, place, and time. She appears well-developed and well-nourished. No distress.  Smells of tobacco  HENT:  Head: Normocephalic and atraumatic.  Right Ear: External ear normal.  Left Ear: External ear normal.  Mouth/Throat: Oropharynx is clear and moist.  dentures  Eyes: Conjunctivae are normal. Pupils are equal, round, and reactive to light.  Neck: Normal range of motion. Neck supple.  Cardiovascular: Normal rate, regular rhythm and normal heart sounds.   Pulmonary/Chest: Effort normal and breath sounds normal.  Coarse rhonchi throughout  Abdominal: Soft. She exhibits distension. Bowel sounds are increased. There is no hepatosplenomegaly. There is tenderness in the right upper quadrant.  Musculoskeletal: She exhibits edema.  Lymphadenopathy:    She has no cervical adenopathy.  Neurological: She is alert and oriented to person, place, and  time. Coordination abnormal.  Small steps, balance impaired  Psychiatric: She has a normal mood and affect. Her behavior is normal. Thought content normal.    1. Other emphysema ([place])  2. Chronic anxiety  3. Nocturnal hypoxemia due to emphysema (H[facility])  4. Abdominal distension  - US Abdomen Complete; Future - CBC - COMPLETE METABOLIC PANEL WITH GFR - Lipase - Lipid panel - Urinalysis, Routine w reflex microscopic  5. Tobacco abuse  6. Risk for falls  - Vitamin D (25 hydroxy)  7. Encounter to establish care with new doctor - MM Digital Screening; Future - DG Bone Density; Future  8. 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

## 2016-11-07 NOTE — Patient Instructions (Addendum)
If you come here you MUST reduce and go off of the xanax If you wish to stay on Xanax you would need to see another doctor Initial change will be to reduce to 1/2 mg 3 times a day  Try to reduce the smoking  For the abdominal distension/swelling I recommend lab tests and an abdominal ultrasound Do labs today Increase the lasix to 40 mg (2 pills) a day  I have also ordered a mammogram and  Bone density test , get these at [facility]  See me in a month

## 2016-11-08 LAB — COMPLETE METABOLIC PANEL WITH GFR
ALBUMIN: 3.7 g/dL (ref 3.6–5.1)
ALK PHOS: 81 U/L (ref 33–130)
ALT: 11 U/L (ref 6–29)
AST: 12 U/L (ref 10–35)
BUN: 16 mg/dL (ref 7–25)
CHLORIDE: 104 mmol/L (ref 98–110)
CO2: 32 mmol/L — AB (ref 20–31)
Calcium: 8.6 mg/dL (ref 8.6–10.4)
Creat: 0.64 mg/dL (ref 0.50–0.99)
GFR, Est African American: >89 mL/min (ref >=60)
GLUCOSE: 76 mg/dL (ref 65–99)
POTASSIUM: 3.6 mmol/L (ref 3.5–5.3)
SODIUM: 143 mmol/L (ref 135–146)
Total Bilirubin: 0.3 mg/dL (ref 0.2–1.2)
Total Protein: 6.1 g/dL (ref 6.1–8.1)

## 2016-11-08 LAB — CBC
HCT: 39.1 % (ref 35.0–45.0)
HEMOGLOBIN: 12.9 g/dL (ref 11.7–15.5)
MCH: 30.6 pg (ref 27.0–33.0)
MCHC: 33 g/dL (ref 32.0–36.0)
MCV: 92.9 fL (ref 80.0–100.0)
MPV: 8.7 fL (ref 7.5–12.5)
PLATELETS: 326 10*3/uL (ref 140–400)
RBC: 4.21 MIL/uL (ref 3.80–5.10)
RDW: 13.9 % (ref 11.0–15.0)
WBC: 8.4 10*3/uL (ref 3.8–10.8)

## 2016-11-08 LAB — LIPASE: Lipase: 35 U/L (ref 7–60)

## 2016-11-08 LAB — LIPID PANEL
CHOL/HDL RATIO: 4.3 ratio (ref <5.0)
Cholesterol: 163 mg/dL (ref <200)
HDL: 38 mg/dL — AB (ref >50)
LDL CALC: 85 mg/dL (ref <100)
TRIGLYCERIDES: 202 mg/dL — AB (ref <150)
VLDL: 40 mg/dL — ABNORMAL HIGH (ref <30)

## 2016-11-08 LAB — URINALYSIS, ROUTINE W REFLEX MICROSCOPIC
BILIRUBIN URINE: NEGATIVE
Glucose, UA: NEGATIVE
HGB URINE DIPSTICK: NEGATIVE
KETONES UR: NEGATIVE
Leukocytes, UA: NEGATIVE
NITRITE: NEGATIVE
PH: 7 (ref 5.0–8.0)
Protein, ur: NEGATIVE
SPECIFIC GRAVITY, URINE: 1.024 (ref 1.001–1.035)

## 2016-11-08 LAB — VITAMIN D 25 HYDROXY (VIT D DEFICIENCY, FRACTURES): VIT D 25 HYDROXY: 35 ng/mL (ref 30–100)

## 2016-11-12 ENCOUNTER — Encounter: Payer: Self-pay | Admitting: Family Medicine

## 2016-11-12 ENCOUNTER — Ambulatory Visit (HOSPITAL_COMMUNITY)
Admission: RE | Admit: 2016-11-12 | Discharge: 2016-11-12 | Disposition: A | Discharge status: Home / Self Care | Payer: Medicare HMO | Source: Ambulatory Visit | Attending: Family Medicine | Admitting: Family Medicine

## 2016-11-12 ENCOUNTER — Telehealth: Payer: Self-pay | Admitting: Family Medicine

## 2016-11-12 ENCOUNTER — Encounter: Payer: Self-pay | Admitting: Internal Medicine

## 2016-11-12 DIAGNOSIS — J438 Other emphysema: Secondary | ICD-10-CM | POA: Insufficient documentation

## 2016-11-12 DIAGNOSIS — R140 Abdominal distension (gaseous): Secondary | ICD-10-CM

## 2016-11-12 DIAGNOSIS — F419 Anxiety disorder, unspecified: Secondary | ICD-10-CM

## 2016-11-12 DIAGNOSIS — Z9181 History of falling: Secondary | ICD-10-CM

## 2016-11-12 DIAGNOSIS — Z720 Tobacco use: Secondary | ICD-10-CM

## 2016-11-12 DIAGNOSIS — J439 Emphysema, unspecified: Secondary | ICD-10-CM

## 2016-11-12 DIAGNOSIS — G4736 Sleep related hypoventilation in conditions classified elsewhere: Secondary | ICD-10-CM | POA: Insufficient documentation

## 2016-11-12 DIAGNOSIS — Z9049 Acquired absence of other specified parts of digestive tract: Secondary | ICD-10-CM | POA: Insufficient documentation

## 2016-11-12 NOTE — Telephone Encounter (Signed)
Pt aware of [place] results.

## 2016-11-12 NOTE — Telephone Encounter (Signed)
-----   Message from [name], MD sent at 11/12/2016  3:46 PM EST ----- Let Tonya Bradley know her ultrasound is normal .  No reason for her feeling of abdominal distension.

## 2016-11-20 ENCOUNTER — Other Ambulatory Visit: Payer: Self-pay | Admitting: Family Medicine

## 2016-11-20 ENCOUNTER — Ambulatory Visit (HOSPITAL_COMMUNITY)
Admission: RE | Admit: 2016-11-20 | Discharge: 2016-11-20 | Disposition: A | Discharge status: Home / Self Care | Payer: Medicare HMO | Source: Ambulatory Visit | Attending: Family Medicine | Admitting: Family Medicine

## 2016-11-20 DIAGNOSIS — F419 Anxiety disorder, unspecified: Secondary | ICD-10-CM

## 2016-11-20 DIAGNOSIS — Z780 Asymptomatic menopausal state: Secondary | ICD-10-CM | POA: Diagnosis not present

## 2016-11-20 DIAGNOSIS — R140 Abdominal distension (gaseous): Secondary | ICD-10-CM

## 2016-11-20 DIAGNOSIS — Z1231 Encounter for screening mammogram for malignant neoplasm of breast: Secondary | ICD-10-CM | POA: Insufficient documentation

## 2016-11-20 DIAGNOSIS — J438 Other emphysema: Secondary | ICD-10-CM

## 2016-11-20 DIAGNOSIS — G4736 Sleep related hypoventilation in conditions classified elsewhere: Secondary | ICD-10-CM | POA: Diagnosis not present

## 2016-11-20 DIAGNOSIS — Z720 Tobacco use: Secondary | ICD-10-CM | POA: Diagnosis present

## 2016-11-20 DIAGNOSIS — Z9181 History of falling: Secondary | ICD-10-CM | POA: Insufficient documentation

## 2016-11-20 DIAGNOSIS — J439 Emphysema, unspecified: Secondary | ICD-10-CM

## 2016-11-20 DIAGNOSIS — M8588 Other specified disorders of bone density and structure, other site: Secondary | ICD-10-CM | POA: Insufficient documentation

## 2016-11-20 DIAGNOSIS — M85852 Other specified disorders of bone density and structure, left thigh: Secondary | ICD-10-CM | POA: Diagnosis not present

## 2016-11-26 ENCOUNTER — Encounter: Payer: Self-pay | Admitting: Family Medicine

## 2016-11-26 DIAGNOSIS — M8580 Other specified disorders of bone density and structure, unspecified site: Secondary | ICD-10-CM

## 2016-12-05 ENCOUNTER — Other Ambulatory Visit: Payer: Self-pay | Admitting: Family Medicine

## 2016-12-06 ENCOUNTER — Telehealth: Payer: Self-pay

## 2016-12-06 MED ORDER — ALPRAZOLAM 0.5 MG PO TABS: 0.5000 mg | ORAL_TABLET | Freq: Two times a day (BID) | ORAL | 0 refills | Status: DC | PRN

## 2016-12-06 NOTE — Telephone Encounter (Signed)
Error

## 2016-12-09 ENCOUNTER — Ambulatory Visit (INDEPENDENT_AMBULATORY_CARE_PROVIDER_SITE_OTHER): Payer: Medicare HMO | Admitting: Family Medicine

## 2016-12-09 ENCOUNTER — Encounter: Payer: Self-pay | Admitting: Family Medicine

## 2016-12-09 VITALS — BP 108/62 | HR 72 | Temp 98.2°F | Resp 20 | Ht 62.0 in | Wt 155.1 lb

## 2016-12-09 DIAGNOSIS — F1320 Sedative, hypnotic or anxiolytic dependence, uncomplicated: Secondary | ICD-10-CM | POA: Diagnosis not present

## 2016-12-09 DIAGNOSIS — Z720 Tobacco use: Secondary | ICD-10-CM | POA: Diagnosis not present

## 2016-12-09 DIAGNOSIS — J438 Other emphysema: Secondary | ICD-10-CM | POA: Diagnosis not present

## 2016-12-09 DIAGNOSIS — F5102 Adjustment insomnia: Secondary | ICD-10-CM | POA: Diagnosis not present

## 2016-12-09 MED ORDER — DOXEPIN HCL 10 MG PO CAPS: 10.0000 mg | ORAL_CAPSULE | Freq: Every day | ORAL | 0 refills | Status: DC

## 2016-12-09 MED ORDER — FUROSEMIDE 40 MG PO TABS: 40.0000 mg | ORAL_TABLET | Freq: Every day | ORAL | 1 refills | Status: DC

## 2016-12-09 MED ORDER — ATORVASTATIN CALCIUM 80 MG PO TABS: 80.0000 mg | ORAL_TABLET | Freq: Every day | ORAL | 1 refills | Status: DC

## 2016-12-09 MED ORDER — PANTOPRAZOLE SODIUM 40 MG PO TBEC
40.0000 mg | DELAYED_RELEASE_TABLET | Freq: Every day | ORAL | 1 refills | Status: DC
Start: 2016-12-09 — End: 2017-05-27

## 2016-12-09 MED ORDER — ROPINIROLE HCL 0.25 MG PO TABS: 0.7500 mg | ORAL_TABLET | Freq: Every evening | ORAL | 3 refills | Status: DC | PRN

## 2016-12-09 MED ORDER — ESCITALOPRAM OXALATE 20 MG PO TABS: 20.0000 mg | ORAL_TABLET | Freq: Every day | ORAL | 1 refills | Status: DC

## 2016-12-09 NOTE — Patient Instructions (Addendum)
Take the doxepin at night to help sleep  Other medicines refilled  Try to cut down or quit smoking  Stay as active as you can manage  See me in 3 months  Call sooner for problems

## 2016-12-09 NOTE — Progress Notes (Signed)
Chief Complaint  Patient presents with  . Follow-up    1 month   Here for follow up At last visit felt stomach was distended Labs normal Ultrasound unremarkable except for fatty liver Discussed she is just overweight and this can be reduced by diet and exercise. We discussed - again - the importance of quitting smoking.  She already has vascular disease and another heart blockage is just a matter of time.  Offered patches or assistance.  She will try to cut down on her own  Is dependent on xanax.  We reduced her from TID to BID.  She understands it is necessary, but cannot sleep.  We reviewed sleep hygiene.  She wants something to take " for now" until she gets off the xanax.  Will try doxepin.  Patient Active Problem List   Diagnosis Date Noted  . Osteopenia 11/26/2016  . Other emphysema ([place]) 11/07/2016  . Chronic anxiety 11/07/2016  . Nocturnal hypoxemia due to emphysema ([place]) 11/07/2016  . Constipation 06/12/2016  . Right carotid bruit 04/03/2016  . CAD (coronary artery disease) 04/03/2016  . Tobacco abuse 04/03/2016  . Atrial flutter ([place]) 03/13/2016  . Hiatal hernia   . Reflux esophagitis   . Schatzki's ring   . Dysphagia 03/01/2016  . Solitary pulmonary nodule 03/01/2016  . 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 Microscopic colitis 08/15/2014  . Loose stools 06/08/2014  . BCC (basal cell carcinoma), face 09/13/2012    Outpatient Encounter Prescriptions as of 12/09/2016  Medication Sig  . ALPRAZolam (XANAX) 0.5 MG tablet Take 1 tablet (0.5 mg total) by mouth 2 (two) times daily as needed for anxiety.  Marland Kitchen amiodarone (PACERONE) 200 MG tablet Take 1 tablet (200 mg total) by mouth daily.  Marland Kitchen atorvastatin (LIPITOR) 80 MG tablet Take 1 tablet (80 mg total) by mouth daily.  . cholecalciferol (VITAMIN D) 1000 UNITS tablet Take 1,000 Units by mouth daily.  Marland Kitchen ELIQUIS 5 MG TABS tablet TAKE (1) TABLET BY MOUTH TWICE DAILY.  Marland Kitchen escitalopram (LEXAPRO) 20 MG tablet Take 1 tablet  (20 mg total) by mouth daily.  . furosemide (LASIX) 40 MG tablet Take 1 tablet (40 mg total) by mouth daily.  . Multiple Vitamin (MULTIVITAMIN) tablet Take 1 tablet by mouth daily.  . nitroGLYCERIN (NITROSTAT) 0.4 MG SL tablet Place 0.4 mg under the tongue every 5 (five) minutes as needed for chest pain. Reported on 06/12/2016  . pantoprazole (PROTONIX) 40 MG tablet Take 1 tablet (40 mg total) by mouth daily.  Marland Kitchen rOPINIRole (REQUIP) 0.25 MG tablet Take 3 tablets (0.75 mg total) by mouth at bedtime as needed (restless legs).  . doxepin (SINEQUAN) 10 MG capsule Take 1 capsule (10 mg total) by mouth at bedtime.   No facility-administered encounter medications on file as of 12/09/2016.     No Known Allergies  Review of Systems  Constitutional: Negative for activity change.  HENT: Negative for congestion and dental problem.   Eyes: Negative for photophobia and visual disturbance.  Respiratory: Positive for cough and shortness of breath.        Smoker, unchanged  Cardiovascular: Negative for chest pain and leg swelling.  Gastrointestinal: Positive for abdominal distention. Negative for constipation and diarrhea.       (overweight)  Genitourinary: Positive for difficulty urinating. Negative for menstrual problem.  Musculoskeletal: Negative for arthralgias.  Neurological: Negative for dizziness and headaches.  Psychiatric/Behavioral: Positive for sleep disturbance. The patient is nervous/anxious.     BP 108/62 (BP Location: Left  Arm, Patient Position: Sitting, Cuff Size: Normal)   Pulse 72   Temp 98.2 F (36.8 C) (Oral)   Resp 20   Ht '5\' 2"'$  (1.575 m)   Wt 155 lb 1.9 oz (70.4 kg)   SpO2 91%   BMI 28.37 kg/m   Physical Exam  Constitutional: She is oriented to person, place, and time. She appears well-developed and well-nourished.  Smells of tobacco  HENT:  Head: Normocephalic.  Mouth/Throat: Oropharynx is clear and moist.  Eyes: Pupils are equal, round, and reactive to light.    Cardiovascular: Normal rate and regular rhythm.   Murmur heard. soft SEM  Pulmonary/Chest: Effort normal and breath sounds normal. She has no wheezes.  Abdominal: Soft. Bowel sounds are normal. There is no tenderness.  Musculoskeletal: Normal range of motion. She exhibits edema.  trace  Neurological: She is alert and oriented to person, place, and time.  Psychiatric: She has a normal mood and affect. Her behavior is normal. Thought content normal.    ASSESSMENT/PLAN:  1. Other emphysema ([place])   2. Tobacco abuse Discussed cessation  3. Anxiolytic dependence (HCC) Reduced dose  4. Adjustment insomnia Add prn doxepin  Patient Instructions  Take the doxepin at night to help sleep  Other medicines refilled  Try to cut down or quit smoking  Stay as active as you can manage  See me in 3 months  Call sooner for problems    [name], MD

## 2017-01-06 ENCOUNTER — Telehealth: Payer: Self-pay

## 2017-01-06 ENCOUNTER — Other Ambulatory Visit: Payer: Self-pay | Admitting: Family Medicine

## 2017-01-06 NOTE — Telephone Encounter (Signed)
Tonya Bradley, should she be out?

## 2017-01-07 ENCOUNTER — Other Ambulatory Visit: Payer: Self-pay

## 2017-01-07 MED ORDER — ALPRAZOLAM 0.5 MG PO TABS: 0.5000 mg | ORAL_TABLET | Freq: Two times a day (BID) | ORAL | 0 refills | Status: DC | PRN

## 2017-01-07 NOTE — Telephone Encounter (Signed)
Yes she is due, this was rx'd on '1 5 18 '$ and filled on 1 5 18.. Xanax 0.5 mg # 60 for 30 days.

## 2017-01-22 ENCOUNTER — Encounter: Payer: Self-pay | Admitting: Cardiology

## 2017-01-22 ENCOUNTER — Ambulatory Visit (INDEPENDENT_AMBULATORY_CARE_PROVIDER_SITE_OTHER): Payer: Medicare HMO | Admitting: Cardiology

## 2017-01-22 VITALS — BP 108/56 | HR 71 | Ht 63.0 in | Wt 157.0 lb

## 2017-01-22 DIAGNOSIS — W19XXXD Unspecified fall, subsequent encounter: Secondary | ICD-10-CM | POA: Diagnosis not present

## 2017-01-22 DIAGNOSIS — I2510 Atherosclerotic heart disease of native coronary artery without angina pectoris: Secondary | ICD-10-CM | POA: Diagnosis not present

## 2017-01-22 DIAGNOSIS — I4892 Unspecified atrial flutter: Secondary | ICD-10-CM

## 2017-01-22 NOTE — Patient Instructions (Signed)
Medication Instructions:  Your physician recommends that you continue on your current medications as directed. Please refer to the Current Medication list given to you today.   Labwork: I WILL REQUEST A COPY OF YOUR LABS FROM PCP  Testing/Procedures: You have been referred to DR. Lovena Le FOR ATRIAL FLUTTER   Follow-Up: Your physician wants you to follow-up in: 6 MONTHS .  You will receive a reminder letter in the mail two months in advance. If you don't receive a letter, please call our office to schedule the follow-up appointment.   Any Other Special Instructions Will Be Listed Below (If Applicable).     If you need a refill on your cardiac medications before your next appointment, please call your pharmacy.

## 2017-01-22 NOTE — Progress Notes (Signed)
Clinical Summary Tonya Bradley is a 66 y.o.female seen today for follow up of the following medical problems.   1. Aflutter - admit 03/2016 with aflutter and RVR. Soft bp's, required amio gtt then converted to oral amio  - no palpitations. No bleeding on eliquis - normal liver tests 11/2016, TSH normal 06/2016   2. CAD - history of previous stenting - cath 2012 with patent coronaries  - denies any recent chest pain.    3. Fall -occurred several months while walking in field. Felt "out of it", fell to ground. No complete LOC.  - no repeat episodes. Can have orthostatic symptoms at times - water bottles x2, coffee x 3 cups, occasional tea, coke 0 2-3 cans daily, no EtoH   SH: getting married in March.  Past Medical History:  Diagnosis Date  . Anemia   . Anxiety   . Atrial flutter ([place]) 03/13/2016  . CAD (coronary artery disease)   . Cancer ([place])    skin cancer  . Cataract   . Colon polyp   . COPD (chronic obstructive pulmonary disease) ([place])   . Depression   . Emphysema of lung ([place])   . GERD (gastroesophageal reflux disease)   . Hypercholesterolemia   . Osteoporosis   . Oxygen deficiency      No Known Allergies   Current Outpatient Prescriptions  Medication Sig Dispense Refill  . ALPRAZolam (XANAX) 0.5 MG tablet Take 1 tablet (0.5 mg total) by mouth 2 (two) times daily as needed for anxiety. 60 tablet 0  . amiodarone (PACERONE) 200 MG tablet Take 1 tablet (200 mg total) by mouth daily. 30 tablet 6  . atorvastatin (LIPITOR) 80 MG tablet Take 1 tablet (80 mg total) by mouth daily. 90 tablet 1  . cholecalciferol (VITAMIN D) 1000 UNITS tablet Take 1,000 Units by mouth daily.    Marland Kitchen doxepin (SINEQUAN) 10 MG capsule Take 1 capsule (10 mg total) by mouth at bedtime. 30 capsule 0  . ELIQUIS 5 MG TABS tablet TAKE (1) TABLET BY MOUTH TWICE DAILY. 60 tablet 6  . escitalopram (LEXAPRO) 20 MG tablet Take 1 tablet (20 mg total) by mouth daily. 90 tablet 1  . furosemide  (LASIX) 40 MG tablet Take 1 tablet (40 mg total) by mouth daily. 90 tablet 1  . Multiple Vitamin (MULTIVITAMIN) tablet Take 1 tablet by mouth daily.    . nitroGLYCERIN (NITROSTAT) 0.4 MG SL tablet Place 0.4 mg under the tongue every 5 (five) minutes as needed for chest pain. Reported on 06/12/2016    . pantoprazole (PROTONIX) 40 MG tablet Take 1 tablet (40 mg total) by mouth daily. 90 tablet 1  . rOPINIRole (REQUIP) 0.25 MG tablet Take 3 tablets (0.75 mg total) by mouth at bedtime as needed (restless legs). 90 tablet 3   No current facility-administered medications for this visit.      Past Surgical History:  Procedure Laterality Date  . APPENDECTOMY    . BASAL CELL CARCINOMA EXCISION    . CARDIAC CATHETERIZATION    . CHOLECYSTECTOMY    . COLONOSCOPY N/A 06/23/2014   Dr. Rourk:Rectal and colonic polyps-removed, Pancolonic diverticulosis. lymphocytic colitis  . CORONARY ANGIOPLASTY WITH STENT PLACEMENT    . ESOPHAGEAL DILATION N/A 03/13/2016   Procedure: ESOPHAGEAL DILATION;  Surgeon: Daneil Dolin, MD;  [facility];  Service: Endoscopy;  Laterality: N/A;  . ESOPHAGOGASTRODUODENOSCOPY N/A 03/13/2016   Procedure: ESOPHAGOGASTRODUODENOSCOPY (EGD);  Surgeon: Daneil Dolin, MD;  [facility]: [facility];  Service: Endoscopy;  Laterality: N/A;  0930     No Known Allergies    Family History  Problem Relation Age of Onset  . Breast cancer Sister   . Cancer Sister     breast  . Heart attack Father   . Heart disease Father   . Stroke Mother   . Heart disease Mother   . Hyperlipidemia Mother   . Heart attack Sister   . Heart disease Brother     ?heart failure  . Hypertension Son   . Heart disease Maternal Grandfather   . Cancer Cousin   . Hypertension Son   . Colon cancer Neg Hx      Social History Tonya Bradley reports that she has been smoking Cigarettes.  She started smoking about 50 years ago. She has been smoking about 2.00 packs per day. She has never used  smokeless tobacco. Tonya Bradley reports that she does not drink alcohol.   Review of Systems CONSTITUTIONAL: No weight loss, fever, chills, weakness or fatigue.  HEENT: Eyes: No visual loss, blurred vision, double vision or yellow sclerae.No hearing loss, sneezing, congestion, runny nose or sore throat.  SKIN: No rash or itching.  CARDIOVASCULAR: per HPI RESPIRATORY: No shortness of breath, cough or sputum.  GASTROINTESTINAL: No anorexia, nausea, vomiting or diarrhea. No abdominal pain or blood.  GENITOURINARY: No burning on urination, no polyuria NEUROLOGICAL: No headache, dizziness, syncope, paralysis, ataxia, numbness or tingling in the extremities. No change in bowel or bladder control.  MUSCULOSKELETAL: No muscle, back pain, joint pain or stiffness.  LYMPHATICS: No enlarged nodes. No history of splenectomy.  PSYCHIATRIC: No history of depression or anxiety.  ENDOCRINOLOGIC: No reports of sweating, cold or heat intolerance. No polyuria or polydipsia.  Marland Kitchen   Physical Examination Vitals:   01/22/17 1028  BP: (!) 108/56  Pulse: 71   Vitals:   01/22/17 1028  Weight: 157 lb (71.2 kg)  Height: '5\' 3"'$  (1.6 m)    Gen: resting comfortably, no acute distress HEENT: no scleral icterus, pupils equal round and reactive, no palptable cervical adenopathy,  CV: RRR, no m/r/g, no jvd Resp: Clear to auscultation bilaterally GI: abdomen is soft, non-tender, non-distended, normal bowel sounds, no hepatosplenomegaly MSK: extremities are warm, no edema.  Skin: warm, no rash Neuro:  no focal deficits Psych: appropriate affect   Diagnostic Studies 03/2016 echo Study Conclusions  - Left ventricle: The cavity size was normal. Wall thickness was  increased in a pattern of mild LVH. Systolic function was normal.  The estimated ejection fraction was in the range of 55% to 60%.  Wall motion was normal; there were no regional wall motion  abnormalities. - Aortic valve: Valve area (VTI):  2.56 cm^2. Valve area (Vmax):  2.15 cm^2. - Atrial septum: There was increased thickness of the septum,  consistent with lipomatous hypertrophy. - Inferior vena cava: The vessel was dilated. The respirophasic  diameter changes were blunted (< 50%), consistent with elevated  central venous pressure. - Technically adequate study.     Assessment and Plan  1. Aflutter - no recent symptoms,  - CHADS2Vasc score is 2, she will continue eliquis  2. CAD - no current symptoms, she will continue current meds  3. Fall  -isolated episode of dizziness and fall several months ago, no recurrence - if recurrence consider event monitor. Has orthostatic symptoms at times, counseled to decrease her caffeinated beverage intake and drink more water as dehydration may have played a role.   F/u 6 months  Arnoldo Lenis, M.D.

## 2017-01-27 ENCOUNTER — Other Ambulatory Visit: Payer: Self-pay | Admitting: Family Medicine

## 2017-01-28 NOTE — Telephone Encounter (Signed)
Seen 1 8 18

## 2017-01-29 ENCOUNTER — Institutional Professional Consult (permissible substitution): Payer: Medicare HMO | Admitting: Internal Medicine

## 2017-02-03 ENCOUNTER — Other Ambulatory Visit: Payer: Self-pay

## 2017-02-03 ENCOUNTER — Other Ambulatory Visit: Payer: Self-pay | Admitting: Family Medicine

## 2017-02-04 ENCOUNTER — Other Ambulatory Visit: Payer: Self-pay | Admitting: Family Medicine

## 2017-03-05 ENCOUNTER — Other Ambulatory Visit: Payer: Self-pay | Admitting: Family Medicine

## 2017-03-10 ENCOUNTER — Ambulatory Visit: Payer: Medicare HMO | Admitting: Family Medicine

## 2017-03-11 ENCOUNTER — Encounter: Payer: Self-pay | Admitting: Family Medicine

## 2017-03-11 ENCOUNTER — Ambulatory Visit (INDEPENDENT_AMBULATORY_CARE_PROVIDER_SITE_OTHER): Payer: Medicare HMO | Admitting: Family Medicine

## 2017-03-11 VITALS — BP 116/60 | HR 72 | Temp 98.1°F | Resp 20 | Ht 63.0 in | Wt 160.0 lb

## 2017-03-11 DIAGNOSIS — F17200 Nicotine dependence, unspecified, uncomplicated: Secondary | ICD-10-CM

## 2017-03-11 DIAGNOSIS — I2510 Atherosclerotic heart disease of native coronary artery without angina pectoris: Secondary | ICD-10-CM | POA: Diagnosis not present

## 2017-03-11 DIAGNOSIS — F1320 Sedative, hypnotic or anxiolytic dependence, uncomplicated: Secondary | ICD-10-CM | POA: Diagnosis not present

## 2017-03-11 DIAGNOSIS — F419 Anxiety disorder, unspecified: Secondary | ICD-10-CM

## 2017-03-11 DIAGNOSIS — L821 Other seborrheic keratosis: Secondary | ICD-10-CM

## 2017-03-11 MED ORDER — BUSPIRONE HCL 7.5 MG PO TABS: 7.5000 mg | ORAL_TABLET | Freq: Three times a day (TID) | ORAL | 1 refills | Status: DC

## 2017-03-11 MED ORDER — BUSPIRONE HCL 7.5 MG PO TABS: 7.5000 mg | ORAL_TABLET | Freq: Three times a day (TID) | ORAL | 0 refills | Status: DC

## 2017-03-11 NOTE — Patient Instructions (Signed)
Continue the xanax a night Take the buspar as needed during the day Continue the lexapro Stop the doxepin if it is not helping  Walk every day that you are able Try to cut down on the smoking  Need blood work and a check up in 3 months

## 2017-03-11 NOTE — Progress Notes (Signed)
Chief Complaint  Patient presents with  . Follow-up    3 month   Patient is here for routine follow-up. Her blood pressure is in good control. She continues to smoke cigarettes, a pack a day. I discussed with the patient that given her coronary artery disease, this is a risk of developing further coronary problems. She feels unable to quit at this time. She states she is continuing to try to cut down. The patient is having trouble getting off of her anxiolytics. She's been on benzodiazepines per year. Over the last 3 months I have tried to reduce her benzodiazepine usage from 3 times a day down to 1-1-1/2 a day. She states at times she feels "crazy", she can't sleep, she can't focus. She is on Lexapro 20 mg a day. She also takes Wellbutrin to try to quit smoking. I will add BuSpar to see if this helps her. If not then I need to refer her to a Veterinary surgeon. Patient has multiple skin lesions that concern her. She has a history of basal cell carcinoma. She is under the care of cardiology. She has a history of atrial flutter. She's been referred to an electrophysiologist. She has no chest pain, palpitations, or shortness of breath.  Patient Active Problem List   Diagnosis Date Noted  . Osteopenia 11/26/2016  . Other emphysema ([place]) 11/07/2016  . Chronic anxiety 11/07/2016  . 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 Constipation 06/12/2016  . Right carotid bruit 04/03/2016  . CAD (coronary artery disease) 04/03/2016  . Tobacco abuse 04/03/2016  . Atrial flutter ([place]) 03/13/2016  . Hiatal hernia   . Reflux esophagitis   . Schatzki's ring   . Dysphagia 03/01/2016  . Solitary pulmonary nodule 03/01/2016  . 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 Microscopic colitis 08/15/2014  . Loose stools 06/08/2014  . BCC (basal cell carcinoma), face 09/13/2012    Outpatient Encounter Prescriptions as of 03/11/2017  Medication Sig  . ALPRAZolam (XANAX) 0.5 MG  tablet Take 1 tablet (0.5 mg total) by mouth at bedtime as needed for anxiety.  Marland Kitchen amiodarone (PACERONE) 200 MG tablet Take 1 tablet (200 mg total) by mouth daily.  Marland Kitchen atorvastatin (LIPITOR) 80 MG tablet Take 1 tablet (80 mg total) by mouth daily.  . cholecalciferol (VITAMIN D) 1000 UNITS tablet Take 1,000 Units by mouth daily.  Marland Kitchen doxepin (SINEQUAN) 10 MG capsule TAKE 1 CAPSULE EVERY NIGHT AT BEDTIME  . ELIQUIS 5 MG TABS tablet TAKE (1) TABLET BY MOUTH TWICE DAILY.  Marland Kitchen escitalopram (LEXAPRO) 20 MG tablet Take 1 tablet (20 mg total) by mouth daily.  . furosemide (LASIX) 40 MG tablet Take 1 tablet (40 mg total) by mouth daily.  . Multiple Vitamin (MULTIVITAMIN) tablet Take 1 tablet by mouth daily.  . nitroGLYCERIN (NITROSTAT) 0.4 MG SL tablet Place 0.4 mg under the tongue every 5 (five) minutes as needed for chest pain. Reported on 06/12/2016  . pantoprazole (PROTONIX) 40 MG tablet Take 1 tablet (40 mg total) by mouth daily.  Marland Kitchen rOPINIRole (REQUIP) 0.25 MG tablet Take 3 tablets (0.75 mg total) by mouth at bedtime as needed (restless legs).  . busPIRone (BUSPAR) 7.5 MG tablet Take 1 tablet (7.5 mg total) by mouth 3 (three) times daily.   No facility-administered encounter medications on file as of 03/11/2017.     No Known Allergies  Review of Systems  Constitutional: Negative for activity change.  HENT: Negative for congestion and dental problem.   Eyes: Negative  for photophobia and visual disturbance.  Respiratory: Positive for cough and shortness of breath.        Smoker, unchanged  Cardiovascular: Negative for chest pain and leg swelling.  Gastrointestinal: Positive for abdominal distention. Negative for constipation and diarrhea.       (overweight)  Genitourinary: Negative for menstrual problem.  Musculoskeletal: Negative for arthralgias.  Skin: Positive for color change.  Neurological: Negative for dizziness and headaches.  Psychiatric/Behavioral: Positive for sleep disturbance. The  patient is nervous/anxious.     BP 116/60 (BP Location: Right Arm, Patient Position: Sitting, Cuff Size: Normal)   Pulse 72   Temp 98.1 F (36.7 C) (Temporal)   Resp 20   Ht '5\' 3"'$  (1.6 m)   Wt 160 lb 0.6 oz (72.6 kg)   SpO2 90%   BMI 28.35 kg/m   Physical Exam  Constitutional: She is oriented to person, place, and time. She appears well-developed and well-nourished.  Smells of tobacco  HENT:  Head: Normocephalic.  Mouth/Throat: Oropharynx is clear and moist.  Eyes: Pupils are equal, round, and reactive to light.  Cardiovascular: Normal rate and regular rhythm.   Murmur heard. soft SEM  Pulmonary/Chest: Effort normal and breath sounds normal. She has no wheezes.  Abdominal: Soft. Bowel sounds are normal. There is no tenderness.  Musculoskeletal: Normal range of motion. She exhibits edema.  trace  Neurological: She is alert and oriented to person, place, and time.  Skin:  Anterior chest has 2 or 3 hyperkeratotic, scaly, slightly pigmented seborrheic keratosis. One noted posterior right knee.  Psychiatric: She has a normal mood and affect. Her behavior is normal. Thought content normal.    ASSESSMENT/PLAN:  1. Has been smoking for 30 years Need for chest CT for lung cancer screening is discussed - CT CHEST LUNG CA SCREEN LOW DOSE W/O CM; Future  2. Coronary artery disease involving native coronary artery of native heart without angina pectoris  - CBC - Comprehensive metabolic panel - Lipid panel - VITAMIN D 25 Hydroxy (Vit-D Deficiency, Fractures) - Urinalysis, Routine w reflex microscopic  3. Chronic anxiety Patient is on Lexapro. I'm trying to reduce her benzodiazepine. I'm adding BuSpar. Will refer if needed.  4. 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 Seborrheic keratosis Patient is reassured.  Patient Instructions  Continue the xanax a night Take the buspar as needed during the day Continue the lexapro Stop the doxepin if it is not helping  Walk every day that  you are able Try to cut down on the smoking  Need blood work and a check up in 3 months    [name], MD

## 2017-03-12 ENCOUNTER — Ambulatory Visit (INDEPENDENT_AMBULATORY_CARE_PROVIDER_SITE_OTHER): Payer: Medicare HMO

## 2017-03-12 VITALS — BP 118/76 | HR 65 | Temp 97.8°F | Ht 63.0 in | Wt 160.1 lb

## 2017-03-12 DIAGNOSIS — Z0000 Encounter for general adult medical examination without abnormal findings: Secondary | ICD-10-CM

## 2017-03-12 DIAGNOSIS — Z23 Encounter for immunization: Secondary | ICD-10-CM

## 2017-03-12 NOTE — Progress Notes (Signed)
Subjective:   Tonya Bradley is a 66 y.o. female who presents for an Initial Medicare Annual Wellness Visit.  Review of Systems: N/A  Cardiac Risk Factors include: advanced age (>21mn, >>66women);dyslipidemia;family history of premature cardiovascular disease;smoking/ tobacco exposure     Objective:    Today's Vitals   03/12/17 1334  BP: 118/76  Pulse: 65  Temp: 97.8 F (36.6 C)  TempSrc: Oral  SpO2: 90%  Weight: 160 lb 1 oz (72.6 kg)  Height: '5\' 3"'$  (1.6 m)   Body mass index is 28.35 kg/m.   Current Medications (verified) Outpatient Encounter Prescriptions as of 03/12/2017  Medication Sig  . ALPRAZolam (XANAX) 0.5 MG tablet Take 1 tablet (0.5 mg total) by mouth at bedtime as needed for anxiety.  .Marland Kitchenamiodarone (PACERONE) 200 MG tablet Take 1 tablet (200 mg total) by mouth daily.  .Marland Kitchenatorvastatin (LIPITOR) 80 MG tablet Take 1 tablet (80 mg total) by mouth daily.  . busPIRone (BUSPAR) 7.5 MG tablet Take 1 tablet (7.5 mg total) by mouth 3 (three) times daily.  . cholecalciferol (VITAMIN D) 1000 UNITS tablet Take 1,000 Units by mouth daily.  .Marland Kitchendoxepin (SINEQUAN) 10 MG capsule TAKE 1 CAPSULE EVERY NIGHT AT BEDTIME  . ELIQUIS 5 MG TABS tablet TAKE (1) TABLET BY MOUTH TWICE DAILY.  .Marland Kitchenescitalopram (LEXAPRO) 20 MG tablet Take 1 tablet (20 mg total) by mouth daily.  . furosemide (LASIX) 40 MG tablet Take 1 tablet (40 mg total) by mouth daily.  . Multiple Vitamin (MULTIVITAMIN) tablet Take 1 tablet by mouth daily.  . nitroGLYCERIN (NITROSTAT) 0.4 MG SL tablet Place 0.4 mg under the tongue every 5 (five) minutes as needed for chest pain. Reported on 06/12/2016  . pantoprazole (PROTONIX) 40 MG tablet Take 1 tablet (40 mg total) by mouth daily.  .Marland KitchenrOPINIRole (REQUIP) 0.25 MG tablet Take 3 tablets (0.75 mg total) by mouth at bedtime as needed (restless legs).   No facility-administered encounter medications on file as of 03/12/2017.     Allergies (verified) Patient has no known allergies.     History: Past Medical History:  Diagnosis Date  . Anemia   . Anxiety   . [place]rial [place]utter ([place] 03/13/2016  . CAD (coronary artery disease)   . [place] ([place]    skin cancer  . Cataract   . Colon polyp   . COPD (chronic obstructive pulmonary disease) ([place]   . Depression   . Emphysema of lung ([place]   . GERD (gastroesophageal reflux disease)   . Hypercholesterolemia   . Osteoporosis   . Oxygen deficiency    Past Surgical History:  Procedure Laterality Date  . APPENDECTOMY    . BASAL CELL CARCINOMA EXCISION    . CARDIAC CATHETERIZATION    . CHOLECYSTECTOMY    . COLONOSCOPY N/A 06/23/2014   Dr. Rourk:Rectal and colonic polyps-removed, Pancolonic diverticulosis. lymphocytic colitis  . CORONARY ANGIOPLASTY WITH STENT PLACEMENT    . ESOPHAGEAL DILATION N/A 03/13/2016   Procedure: ESOPHAGEAL DILATION;  Surgeon: RDaneil Dolin MD;  [facility];  Service: Endoscopy;  Laterality: N/A;  . ESOPHAGOGASTRODUODENOSCOPY N/A 03/13/2016   Procedure: ESOPHAGOGASTRODUODENOSCOPY (EGD);  Surgeon: RDaneil Dolin MD;  [facility];  Service: Endoscopy;  Laterality: N/A;  0930   Family History  Problem Relation Age of Onset  . Breast cancer Sister 545 . Heart disease Sister   . Heart attack Father   . Heart disease Father   . Stroke Mother   . Heart  disease Mother   . Hyperlipidemia Mother   . Heart attack Sister   . Heart disease Brother     ?heart failure  . Congestive Heart Failure Brother   . Hypertension Son   . Heart disease Maternal Grandfather   . Cancer Cousin   . Hypertension Son   . Colon cancer Neg Hx    Social History   Occupational History  . 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 Smoking status: Current Every Day Smoker    Packs/day: 1.50    Years: 45.00    Types: Cigarettes    Start date: 06/14/1966  . Smokeless tobacco: Never Used     Comment: 1-2 packs daily  . Alcohol use No  . Drug use: No  . Sexual  activity: Yes    Birth control/ protection: Post-menopausal    Tobacco Counseling Ready to quit: No Counseling given: Yes   Activities of Daily Living In your present state of health, do you have any difficulty performing the following activities: 03/12/2017 11/07/2016  Hearing? N N  Vision? N N  Difficulty concentrating or making decisions? N N  Walking or climbing stairs? N N  Dressing or bathing? N N  Doing errands, shopping? N N  Preparing Food and eating ? N -  Using the Toilet? N -  In the past six months, have you accidently leaked urine? N -  Do you have problems with loss of bowel control? N -  Managing your Medications? N -  Managing your Finances? N -  Housekeeping or managing your Housekeeping? N -  Some recent data might be hidden    Immunizations and Health Maintenance Immunization History  Administered Date(s) Administered  . Influenza Whole 08/02/2016  . Influenza,inj,Quad PF,36+ Mos 11/15/2015  . Pneumococcal Conjugate-13 03/12/2017  . Pneumococcal Polysaccharide-23 06/16/2012   There are no preventive care reminders to display for this patient.  Patient Care Team: [name], MD as PCP - General (Family Medicine) Daneil Dolin, MD as Consulting Physician (Gastroenterology)  Indicate any recent Medical Services you may have received from other than [facility] providers in the past year (date may be approximate).     Assessment:   This is a routine wellness examination for Tonya Bradley.  Hearing/Vision screen  Visual Acuity Screening   Right eye Left eye Both eyes  Without correction:     With correction: '20/20 20/20 20/20 '$    Dietary issues and exercise activities discussed: Current Exercise Habits: Structured exercise class, Type of exercise: treadmill;stretching, Time (Minutes): 60, Frequency (Times/Week): 2, Weekly Exercise (Minutes/Week): 120, Intensity: Mild, Exercise limited by: None identified  Goals    . Increase water intake    . Quit  smoking / using tobacco      Depression Screen PHQ 2/9 Scores 03/12/2017 11/07/2016  PHQ - 2 Score 3 0  PHQ- 9 Score 3 -    Fall Risk Fall Risk  03/12/2017 11/07/2016  Falls in the past year? Yes Yes  Number falls in past yr: 1 1  Injury with Fall? No Yes  Follow up Falls evaluation completed;Education provided;Falls prevention discussed -    Cognitive Function: Normal by direct observation  Screening Tests Health Maintenance  Topic Date Due  . TETANUS/TDAP  06/09/2017 (Originally 02/05/1970)  . Hepatitis C Screening  06/09/2017 (Originally 11-06-1951)  . INFLUENZA VACCINE  07/02/2017  . PNA vac Low Risk Adult (2 of 2 - PPSV23) 03/12/2018  . MAMMOGRAM  11/20/2018  . COLONOSCOPY  06/23/2024  . DEXA SCAN  Completed      Plan:  I have personally reviewed and addressed the Medicare Annual Wellness questionnaire and have noted the following in the patient's chart:  A. Medical and social history B. Use of alcohol, tobacco or illicit drugs  C. Current medications and supplements D. Functional ability and status E.  Nutritional status F.  Physical activity G. Advance directives H. List of other physicians I.  Hospitalizations, surgeries, and ER visits in previous 12 months J.  [place] to include cognitive, depression, and falls L. Referrals and appointments - none  In addition, I have reviewed and discussed with patient certain preventive protocols, quality metrics, and best practice recommendations. A written personalized care plan for preventive services as well as general preventive health recommendations were provided to patient.  Signed,   Stormy Fabian, LPN Lead Nurse Health Advisor

## 2017-03-12 NOTE — Patient Instructions (Addendum)
Ms. Hark , Thank you for taking time to come for your Medicare Wellness Visit. I appreciate your ongoing commitment to your health goals. Please review the following plan we discussed and let me know if I can assist you in the future.   Screening recommendations/referrals: Colonoscopy: Up to date, next due 06/2024 Mammogram: Up to date, next due 11/2017 Bone Density: Up to date Recommended yearly ophthalmology/optometry visit for glaucoma screening and checkup Recommended yearly dental visit for hygiene and checkup  Vaccinations: Influenza vaccine: Up to date, next due 07/2017 Pneumococcal vaccine: Due for Prevnar 13, administered today Tdap vaccine: Due now, check with your insurance company to see if this is covered Shingles vaccine: Due now, check with your insurance company to see if this is covered   Advanced directives: Advance directive discussed with you today. I have provided a copy for you to complete at home and have notarized. Once this is complete please bring a copy in to our office so we can scan it into your chart.  Conditions/risks identified: Pre-obese, recommend increasing your exercise program at least 3 days a week. Quit smoking.    Next appointment: Follow up with Dr. Meda Coffee on 06/10/2017 at 1:40 pm. Follow up in 1 year for your annual wellness visit.  Preventive Care 32 Years and Older, Female Preventive care refers to lifestyle choices and visits with your health care provider that can promote health and wellness. What does preventive care include?  A yearly physical exam. This is also called an annual well check.  Dental exams once or twice a year.  Routine eye exams. Ask your health care provider how often you should have your eyes checked.  Personal lifestyle choices, including:  Daily care of your teeth and gums.  Regular physical activity.  Eating a healthy diet.  Avoiding tobacco and drug use.  Limiting alcohol use.  Practicing safe  sex.  Taking low-dose aspirin every day.  Taking vitamin and mineral supplements as recommended by your health care provider. What happens during an annual well check? The services and screenings done by your health care provider during your annual well check will depend on your age, overall health, lifestyle risk factors, and family history of disease. Counseling  Your health care provider may ask you questions about your:  Alcohol use.  Tobacco use.  Drug use.  Emotional well-being.  Home and relationship well-being.  Sexual activity.  Eating habits.  History of falls.  Memory and ability to understand (cognition).  Work and work Statistician.  Reproductive health. Screening  You may have the following tests or measurements:  Height, weight, and BMI.  Blood pressure.  Lipid and cholesterol levels. These may be checked every 5 years, or more frequently if you are over 20 years old.  Skin check.  Lung cancer screening. You may have this screening every year starting at age 29 if you have a 30-pack-year history of smoking and currently smoke or have quit within the past 15 years.  Fecal occult blood test (FOBT) of the stool. You may have this test every year starting at age 6.  Flexible sigmoidoscopy or colonoscopy. You may have a sigmoidoscopy every 5 years or a colonoscopy every 10 years starting at age 41.  Hepatitis C blood test.  Hepatitis B blood test.  Sexually transmitted disease (STD) testing.  Diabetes screening. This is done by checking your blood sugar (glucose) after you have not eaten for a while (fasting). You may have this done every 1-3 years.  Bone density scan. This is done to screen for osteoporosis. You may have this done starting at age 41.  Mammogram. This may be done every 1-2 years. Talk to your health care provider about how often you should have regular mammograms. Talk with your health care provider about your test results,  treatment options, and if necessary, the need for more tests. Vaccines  Your health care provider may recommend certain vaccines, such as:  Influenza vaccine. This is recommended every year.  Tetanus, diphtheria, and acellular pertussis (Tdap, Td) vaccine. You may need a Td booster every 10 years.  Zoster vaccine. You may need this after age 59.  Pneumococcal 13-valent conjugate (PCV13) vaccine. One dose is recommended after age 44.  Pneumococcal polysaccharide (PPSV23) vaccine. One dose is recommended after age 50. Talk to your health care provider about which screenings and vaccines you need and how often you need them. This information is not intended to replace advice given to you by your health care provider. Make sure you discuss any questions you have with your health care provider. Document Released: 12/15/2015 Document Revised: 08/07/2016 Document Reviewed: 09/19/2015 Elsevier Interactive Patient Education  2017 [employer] Prevention in the Home Falls can cause injuries. They can happen to people of all ages. There are many things you can do to make your home safe and to help prevent falls. What can I do on the outside of my home?  Regularly fix the edges of walkways and driveways and fix any cracks.  Remove anything that might make you trip as you walk through a door, such as a raised step or threshold.  Trim any bushes or trees on the path to your home.  Use bright outdoor lighting.  Clear any walking paths of anything that might make someone trip, such as rocks or tools.  Regularly check to see if handrails are loose or broken. Make sure that both sides of any steps have handrails.  Any raised decks and porches should have guardrails on the edges.  Have any leaves, snow, or ice cleared regularly.  Use sand or salt on walking paths during winter.  Clean up any spills in your garage right away. This includes oil or grease spills. What can I do in the  bathroom?  Use night lights.  Install grab bars by the toilet and in the tub and shower. Do not use towel bars as grab bars.  Use non-skid mats or decals in the tub or shower.  If you need to sit down in the shower, use a plastic, non-slip stool.  Keep the floor dry. Clean up any water that spills on the floor as soon as it happens.  Remove soap buildup in the tub or shower regularly.  Attach bath mats securely with double-sided non-slip rug tape.  Do not have throw rugs and other things on the floor that can make you trip. What can I do in the bedroom?  Use night lights.  Make sure that you have a light by your bed that is easy to reach.  Do not use any sheets or blankets that are too big for your bed. They should not hang down onto the floor.  Have a firm chair that has side arms. You can use this for support while you get dressed.  Do not have throw rugs and other things on the floor that can make you trip. What can I do in the kitchen?  Clean up any spills right away.  Avoid walking  on wet floors.  Keep items that you use a lot in easy-to-reach places.  If you need to reach something above you, use a strong step stool that has a grab bar.  Keep electrical cords out of the way.  Do not use floor polish or wax that makes floors slippery. If you must use wax, use non-skid floor wax.  Do not have throw rugs and other things on the floor that can make you trip. What can I do with my stairs?  Do not leave any items on the stairs.  Make sure that there are handrails on both sides of the stairs and use them. Fix handrails that are broken or loose. Make sure that handrails are as long as the stairways.  Check any carpeting to make sure that it is firmly attached to the stairs. Fix any carpet that is loose or worn.  Avoid having throw rugs at the top or bottom of the stairs. If you do have throw rugs, attach them to the floor with carpet tape.  Make sure that you have a  light switch at the top of the stairs and the bottom of the stairs. If you do not have them, ask someone to add them for you. What else can I do to help prevent falls?  Wear shoes that:  Do not have high heels.  Have rubber bottoms.  Are comfortable and fit you well.  Are closed at the toe. Do not wear sandals.  If you use a stepladder:  Make sure that it is fully opened. Do not climb a closed stepladder.  Make sure that both sides of the stepladder are locked into place.  Ask someone to hold it for you, if possible.  Clearly mark and make sure that you can see:  Any grab bars or handrails.  First and last steps.  Where the edge of each step is.  Use tools that help you move around (mobility aids) if they are needed. These include:  Canes.  Walkers.  Scooters.  Crutches.  Turn on the lights when you go into a dark area. Replace any light bulbs as soon as they burn out.  Set up your furniture so you have a clear path. Avoid moving your furniture around.  If any of your floors are uneven, fix them.  If there are any pets around you, be aware of where they are.  Review your medicines with your doctor. Some medicines can make you feel dizzy. This can increase your chance of falling. Ask your doctor what other things that you can do to help prevent falls. This information is not intended to replace advice given to you by your health care provider. Make sure you discuss any questions you have with your health care provider. Document Released: 09/14/2009 Document Revised: 04/25/2016 Document Reviewed: 12/23/2014 Elsevier Interactive Patient Education  2017 [employer].  Steps to Quit Smoking Smoking tobacco can be bad for your health. It can also affect almost every organ in your body. Smoking puts you and people around you at risk for many serious long-lasting (chronic) diseases. Quitting smoking is hard, but it is one of the best things that you can do for your  health. It is never too late to quit. What are the benefits of quitting smoking? When you quit smoking, you lower your risk for getting serious diseases and conditions. They can include:  Lung cancer or lung disease.  Heart disease.  Stroke.  Heart attack.  Not being able to  have children (infertility).  Weak bones (osteoporosis) and broken bones (fractures). If you have coughing, wheezing, and shortness of breath, those symptoms may get better when you quit. You may also get sick less often. If you are pregnant, quitting smoking can help to lower your chances of having a baby of low birth weight. What can I do to help me quit smoking? Talk with your doctor about what can help you quit smoking. Some things you can do (strategies) include:  Quitting smoking totally, instead of slowly cutting back how much you smoke over a period of time.  Going to in-person counseling. You are more likely to quit if you go to many counseling sessions.  Using resources and support systems, such as:  Online chats with a Social worker.  Phone quitlines.  Printed Furniture conservator/restorer.  Support groups or group counseling.  Text messaging programs.  Mobile phone apps or applications.  Taking medicines. Some of these medicines may have nicotine in them. If you are pregnant or breastfeeding, do not take any medicines to quit smoking unless your doctor says it is okay. Talk with your doctor about counseling or other things that can help you. Talk with your doctor about using more than one strategy at the same time, such as taking medicines while you are also going to in-person counseling. This can help make quitting easier. What things can I do to make it easier to quit? Quitting smoking might feel very hard at first, but there is a lot that you can do to make it easier. Take these steps:  Talk to your family and friends. Ask them to support and encourage you.  Call phone quitlines, reach out to support  groups, or work with a Social worker.  Ask people who smoke to not smoke around you.  Avoid places that make you want (trigger) to smoke, such as:  Bars.  Parties.  Smoke-break areas at work.  Spend time with people who do not smoke.  Lower the stress in your life. Stress can make you want to smoke. Try these things to help your stress:  Getting regular exercise.  Deep-breathing exercises.  Yoga.  Meditating.  Doing a body scan. To do this, close your eyes, focus on one area of your body at a time from head to toe, and notice which parts of your body are tense. Try to relax the muscles in those areas.  Download or buy apps on your mobile phone or tablet that can help you stick to your quit plan. There are many free apps, such as QuitGuide from the [employer] Office manager for Disease Control and Prevention). You can find more support from smokefree.gov and other websites. This information is not intended to replace advice given to you by your health care provider. Make sure you discuss any questions you have with your health care provider. Document Released: 09/14/2009 Document Revised: 07/16/2016 Document Reviewed: 04/04/2015 Elsevier Interactive Patient Education  2017 [employer].

## 2017-03-27 ENCOUNTER — Other Ambulatory Visit: Payer: Self-pay | Admitting: Cardiology

## 2017-03-27 ENCOUNTER — Other Ambulatory Visit: Payer: Self-pay | Admitting: Family Medicine

## 2017-03-31 ENCOUNTER — Telehealth: Payer: Self-pay | Admitting: Family Medicine

## 2017-03-31 NOTE — Telephone Encounter (Signed)
Last filled??

## 2017-03-31 NOTE — Telephone Encounter (Signed)
$'4 10 18 'p$ Report on your desk

## 2017-03-31 NOTE — Telephone Encounter (Signed)
seen 4 10 18

## 2017-03-31 NOTE — Telephone Encounter (Signed)
Called and spoke to Tonya Bradley, aware.

## 2017-03-31 NOTE — Telephone Encounter (Signed)
I will sign, but pharmacy may not provide an early refill

## 2017-04-01 ENCOUNTER — Encounter: Payer: Self-pay | Admitting: Internal Medicine

## 2017-04-01 ENCOUNTER — Ambulatory Visit (INDEPENDENT_AMBULATORY_CARE_PROVIDER_SITE_OTHER): Payer: Medicare HMO | Admitting: Internal Medicine

## 2017-04-01 ENCOUNTER — Encounter (INDEPENDENT_AMBULATORY_CARE_PROVIDER_SITE_OTHER): Payer: Self-pay

## 2017-04-01 VITALS — BP 102/60 | HR 64 | Ht 62.0 in | Wt 166.8 lb

## 2017-04-01 DIAGNOSIS — I483 Typical atrial flutter: Secondary | ICD-10-CM | POA: Diagnosis not present

## 2017-04-01 NOTE — Progress Notes (Signed)
     HPI Tonya Bradley is referred today for evaluation of atrial flutter. She developed atrial flutter with a RVR about a year ago and was placed on amiodarone. She has been anti-coagulated. She had recurrent flutter about 6 months ago and felt her heart racing associated with syncope. She has been stable since then. She has been anti-coagulated since then. She and her other MD's have been concerned about the side effects of long term amiodarone. No chest pain. She has remote CAD, s/p stenting. No Known Allergies         Current Outpatient Prescriptions  Medication Sig Dispense Refill  . ALPRAZolam (XANAX) 0.5 MG tablet Take 0.5 mg by mouth at bedtime as needed for sleep.    . amiodarone (PACERONE) 200 MG tablet Take 1 tablet (200 mg total) by mouth daily. 30 tablet 6  . atorvastatin (LIPITOR) 80 MG tablet Take 80 mg by mouth daily.    . busPIRone (BUSPAR) 7.5 MG tablet Take 1 tablet (7.5 mg total) by mouth 3 (three) times daily. 90 tablet 0  . Cholecalciferol (VITAMIN D) 2000 units CAPS Take 2,000 Units by mouth daily.    . doxepin (SINEQUAN) 10 MG capsule Take 10 mg by mouth at bedtime.    . ELIQUIS 5 MG TABS tablet TAKE (1) TABLET BY MOUTH TWICE DAILY. 60 tablet 6  . escitalopram (LEXAPRO) 20 MG tablet Take 20 mg by mouth daily.    . furosemide (LASIX) 40 MG tablet Take 1 tablet (40 mg total) by mouth daily. 90 tablet 1  . Multiple Vitamin (MULTIVITAMIN) tablet Take 1 tablet by mouth daily.    . nitroGLYCERIN (NITROSTAT) 0.4 MG SL tablet Place 0.4 mg under the tongue every 5 (five) minutes as needed for chest pain. Reported on 06/12/2016    . pantoprazole (PROTONIX) 40 MG tablet Take 1 tablet (40 mg total) by mouth daily. 90 tablet 1  . rOPINIRole (REQUIP) 0.25 MG tablet Take 3 tablets (0.75 mg total) by mouth at bedtime as needed (restless legs). 90 tablet 3   No current facility-administered medications for this visit.          Past Medical History:    Diagnosis Date  . Anemia   . Anxiety   . Atrial flutter (HCC) 03/13/2016  . CAD (coronary artery disease)   . Cancer (HCC)    skin cancer  . Cataract   . Colon polyp   . COPD (chronic obstructive pulmonary disease) (HCC)   . Depression   . Emphysema of lung (HCC)   . GERD (gastroesophageal reflux disease)   . Hypercholesterolemia   . Osteoporosis   . Oxygen deficiency     ROS:   All systems reviewed and negative except as noted in the HPI.        Past Surgical History:  Procedure Laterality Date  . APPENDECTOMY    . BASAL CELL CARCINOMA EXCISION    . CARDIAC CATHETERIZATION    . CHOLECYSTECTOMY    . COLONOSCOPY N/A 06/23/2014   Dr. Rourk:Rectal and colonic polyps-removed, Pancolonic diverticulosis. lymphocytic colitis  . CORONARY ANGIOPLASTY WITH STENT PLACEMENT    . ESOPHAGEAL DILATION N/A 03/13/2016   Procedure: ESOPHAGEAL DILATION;  Surgeon: Robert M Rourk, MD;  Location: [facility];  Service: Endoscopy;  Laterality: N/A;  . ESOPHAGOGASTRODUODENOSCOPY N/A 03/13/2016   Procedure: ESOPHAGOGASTRODUODENOSCOPY (EGD);  Surgeon: Robert M Rourk, MD;  Location: [facility];  Service: Endoscopy;  Laterality: N/A;  0930             Family History  Problem Relation Age of Onset  . Breast cancer Sister 59  . Heart disease Sister   . Heart attack Father   . Heart disease Father   . Stroke Mother   . Heart disease Mother   . Hyperlipidemia Mother   . Heart attack Sister   . Heart disease Brother     ?heart failure  . Congestive Heart Failure Brother   . Hypertension Son   . Heart disease Maternal Grandfather   . Cancer Cousin   . Hypertension Son   . Colon cancer Neg Hx      Social History        Social History  . Marital status: Widowed    Spouse name: N/A  . Number of children: 3  . Years of education: 10        Occupational History  . retired     customer service [employer]         Social  History Main Topics  . Smoking status: Current Every Day Smoker    Packs/day: 1.50    Years: 45.00    Types: Cigarettes    Start date: 06/14/1966  . Smokeless tobacco: Never Used     Comment: 1-2 packs daily  . Alcohol use No  . Drug use: No  . Sexual activity: Yes    Birth control/ protection: Post-menopausal       Other Topics Concern  . Not on file      Social History Narrative   Quit high school and got married in 10th grade, age 15   First husband died of colon cancer at young age   Lives in own home   Daughter lives with her   Drinks coffee seldom, coke zero most of the time     BP 102/60 (BP Location: Left Arm)   Pulse 64   Ht 5' 2" (1.575 m)   Wt 166 lb 12.8 oz (75.7 kg)   BMI 30.51 kg/m   Physical Exam:  Well appearing 66 yo woman, NAD HEENT: Unremarkable Neck:  No JVD, no thyromegally Lymphatics:  No adenopathy Back:  No CVA tenderness Lungs:  Clear with no wheezes HEART:  Regular rate rhythm, no murmurs, no rubs, no clicks Abd:  soft, positive bowel sounds, no organomegally, no rebound, no guarding Ext:  2 plus pulses, no edema, no cyanosis, no clubbing Skin:  No rashes no nodules Neuro:  CN II through XII intact, motor grossly intact  EKG - nsr  Assess/Plan: 1. Atrial flutter - I have discussed the treatment options with the patient and she will call us if she wishes to proceed with ablation. I would anticipate continuing her Eliquis for 3-4 months after to be sure she did not develop atrial fib. 2. HTN - her blood pressure is well controlled. Will follow. 3. Tobacco abuse - she will be encouraged to stop smoking.  Corina Stacy,M.D  

## 2017-04-01 NOTE — Telephone Encounter (Signed)
Patient requesting that Rx Xanax be called in @ [facility].

## 2017-04-01 NOTE — Telephone Encounter (Signed)
Done

## 2017-04-01 NOTE — Patient Instructions (Signed)
Medication Instructions:  Your physician recommends that you continue on your current medications as directed. Please refer to the Current Medication list given to you today.   Labwork: none  Testing/Procedures: none  Follow-Up:  Please call us if you would like to schedule ablation. Possible dates are May 9,10,11.  June 1,13,18,27  Any Other Special Instructions Will Be Listed Below (If Applicable).     If you need a refill on your cardiac medications before your next appointment, please call your pharmacy.

## 2017-04-01 NOTE — Telephone Encounter (Signed)
Seen 4 10 18

## 2017-04-03 ENCOUNTER — Ambulatory Visit (HOSPITAL_COMMUNITY)
Admission: RE | Admit: 2017-04-03 | Discharge: 2017-04-03 | Disposition: A | Discharge status: Home / Self Care | Payer: Medicare HMO | Source: Ambulatory Visit | Attending: Family Medicine | Admitting: Family Medicine

## 2017-04-03 ENCOUNTER — Ambulatory Visit (HOSPITAL_COMMUNITY): Payer: Medicare HMO

## 2017-04-03 DIAGNOSIS — F17200 Nicotine dependence, unspecified, uncomplicated: Secondary | ICD-10-CM

## 2017-04-03 DIAGNOSIS — I2510 Atherosclerotic heart disease of native coronary artery without angina pectoris: Secondary | ICD-10-CM | POA: Diagnosis not present

## 2017-04-03 DIAGNOSIS — F17210 Nicotine dependence, cigarettes, uncomplicated: Secondary | ICD-10-CM | POA: Insufficient documentation

## 2017-04-04 ENCOUNTER — Encounter: Payer: Self-pay | Admitting: Family Medicine

## 2017-04-04 ENCOUNTER — Telehealth: Payer: Self-pay

## 2017-04-04 NOTE — Telephone Encounter (Signed)
No salt.  Elevate the legs.  Take double dose in the morning - not BID.  Do this 3 d.  Call if not better by monday

## 2017-04-04 NOTE — Telephone Encounter (Signed)
  Called and spoke to Tonya Bradley, states she has taken 40 mg of lasix " several days" and her legs are still edematous. No other sx, no SOB.

## 2017-04-04 NOTE — Telephone Encounter (Signed)
Dr Meda Coffee gave her lasix '20mg'$  qd and the cardiologist told her to take 2 daily due to increased swelling. Pt states that the swelling as gotten even worse and now she has red slotchy areas on her lower legs. Please advise

## 2017-04-07 ENCOUNTER — Telehealth: Payer: Self-pay | Admitting: Internal Medicine

## 2017-04-07 DIAGNOSIS — I483 Typical atrial flutter: Secondary | ICD-10-CM

## 2017-04-07 DIAGNOSIS — Z01812 Encounter for preprocedural laboratory examination: Secondary | ICD-10-CM

## 2017-04-07 NOTE — Telephone Encounter (Signed)
Advised would schedule and call her this week/next to review instructions. Patient verbalized understanding and agreeable to plan.

## 2017-04-07 NOTE — Telephone Encounter (Signed)
Called [place], aware of advice. Will call me Thursday afternoon, or Friday am.

## 2017-04-07 NOTE — Telephone Encounter (Signed)
Pt given name of procedure and possible dates. ,Pt states she is planning on scheduling for one of the June dates, she will call back once she has decided on a date.

## 2017-04-07 NOTE — Telephone Encounter (Signed)
Follow Up:   Pt said on her last visit Dr Lovena Le had talked about her having some type of procedure at the hospital. She does not remember the name of the procedure. The nurse had given her a schedule of what days he did the procedure. She lost taht schedule,she would like for you to e-mail another on e to her pleaseHer e-mail is [email or web address] .

## 2017-04-07 NOTE — Telephone Encounter (Signed)
New message  Pt call requesting to speak with RN. Pt states she wants to schedule procedure on 05/19/17. Please call back to discuss

## 2017-04-07 NOTE — Telephone Encounter (Signed)
See Dr Tanna Furry 04/01/17 office note: Dr Lovena Le recommended Atrial Flutter Ablation.  Dates below copied from AVS 04/01/17 office visit with Dr Lovena Le: Please call us if you would like to schedule ablation. Possible dates are May 9,10,11.  June 1,13,18,27   [facility] for pt to discuss.

## 2017-04-07 NOTE — Telephone Encounter (Signed)
Follow up      Patient returning call back to triage nurse from today.

## 2017-04-09 ENCOUNTER — Telehealth: Payer: Self-pay | Admitting: Internal Medicine

## 2017-04-09 NOTE — Telephone Encounter (Signed)
Follow Up:    Please call,pt said she did not understand what was said on my-chart,concerning her EKG.

## 2017-04-09 NOTE — Telephone Encounter (Signed)
I spoke with pt. She is calling because she noted EKG she had done at 04/01/17 stated abnormal EKG.  I explained to pt EKG changes she may have had in the past would continue to be seen on future EKG's.  I told her that Dr. Tanna Furry note did not indicate any new EKG changes or new recommendations based on EKG.

## 2017-04-16 ENCOUNTER — Telehealth: Payer: Self-pay

## 2017-04-16 NOTE — Telephone Encounter (Signed)
Tonya Bradley called stating the edema in her feet and ankles isnt any better with doubling her lasix. Has no other sx. Given an appt Friday to see dr Meda Coffee.

## 2017-04-18 ENCOUNTER — Ambulatory Visit (INDEPENDENT_AMBULATORY_CARE_PROVIDER_SITE_OTHER): Payer: Medicare HMO | Admitting: Family Medicine

## 2017-04-18 ENCOUNTER — Encounter: Payer: Self-pay | Admitting: Family Medicine

## 2017-04-18 VITALS — BP 112/70 | HR 74 | Temp 97.7°F | Resp 20 | Ht 62.0 in | Wt 165.0 lb

## 2017-04-18 DIAGNOSIS — G2581 Restless legs syndrome: Secondary | ICD-10-CM | POA: Diagnosis not present

## 2017-04-18 DIAGNOSIS — R0902 Hypoxemia: Secondary | ICD-10-CM

## 2017-04-18 DIAGNOSIS — Z720 Tobacco use: Secondary | ICD-10-CM

## 2017-04-18 DIAGNOSIS — G4762 Sleep related leg cramps: Secondary | ICD-10-CM

## 2017-04-18 DIAGNOSIS — R600 Localized edema: Secondary | ICD-10-CM | POA: Diagnosis not present

## 2017-04-18 MED ORDER — DOXEPIN HCL 10 MG PO CAPS
10.0000 mg | ORAL_CAPSULE | Freq: Every day | ORAL | 1 refills | Status: DC
Start: 2017-04-18 — End: 2017-05-27

## 2017-04-18 MED ORDER — METOLAZONE 10 MG PO TABS: 10.0000 mg | ORAL_TABLET | Freq: Every day | ORAL | 0 refills | Status: DC

## 2017-04-18 NOTE — Progress Notes (Signed)
Chief Complaint  Patient presents with  . Leg Swelling  Patient is here sooner than her scheduled appointment because of increasing pedal edema. This is been going on for 2 weeks. She is taking Lasix 80 mg a day. Unknown what her today she even took 3 pills. (120 mg.) In spite of this she has pitting edema to the knee. She feels uncomfortable. Her abdomen feels distended. She is mildly short of breath. No chest pain. No orthopnea or PND. No change in medication. New over-the-counter medication. The only medicine she is on the cause edema as her Requip, she's been on this for years. I ask her about her diet. 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 She's been told many times to stop her salt. She eats out a lot. She has canned and processed food a lot. She seems unaware that this is not healthy. He is not drink water. She used to mostly drink Coke she is 1, but has recently changed to Sweet tea. She drinks multiple glasses of this a day. In addition to her cigarette smoking, she has a generally unhealthy lifestyle We talked again about her smoking. She had promised me that she and her husband are going to try to quit. Now she sustained a adequate till after their vacation because of going to the beach next week. I emphasized to her that with her known heart disease and lung disease quitting smoking is especially important. Her oxygen level today is only 88%. She has significant emphysema. Patient has restless leg syndrome. In addition she wakes up at least 3 or 4 times a week with leg pain. She states is her whole leg from the thigh down. She has to get up and walk around for a while for it to go away. Her legs hurt similarly when she walks. I'm uncertain what I be causing this, perhaps spinal stenosis. She does not have a lot of back pain  Patient Active Problem List   Diagnosis Date Noted  . Osteopenia 11/26/2016  . Other emphysema ([place]) 11/07/2016  . Chronic anxiety 11/07/2016  . 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 Constipation 06/12/2016  . Right carotid bruit 04/03/2016  . CAD (coronary artery disease) 04/03/2016  . Tobacco abuse 04/03/2016  . Atrial flutter ([place]) 03/13/2016  . Hiatal hernia   . Reflux esophagitis   . Schatzki's ring   . Dysphagia 03/01/2016  . Solitary pulmonary nodule 03/01/2016  . 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 Microscopic colitis 08/15/2014  . Loose stools 06/08/2014  . BCC (basal cell carcinoma), face 09/13/2012    Outpatient Encounter Prescriptions as of 04/18/2017  Medication Sig  . ALPRAZolam (XANAX) 0.5 MG tablet Take 0.5 mg by mouth at bedtime as needed for sleep.  Marland Kitchen amiodarone (PACERONE) 200 MG tablet Take 1 tablet (200 mg total) by mouth daily.  Marland Kitchen atorvastatin (LIPITOR) 80 MG tablet Take 80 mg by mouth daily.  . busPIRone (BUSPAR) 7.5 MG tablet Take 1 tablet (7.5 mg total) by mouth 3 (three) times daily.  . Cholecalciferol (VITAMIN D) 2000 units CAPS Take 2,000 Units by mouth daily.  Marland Kitchen doxepin (SINEQUAN) 10 MG capsule Take 1 capsule (10 mg total) by mouth at bedtime.  Marland Kitchen ELIQUIS 5 MG TABS tablet TAKE (1) TABLET BY MOUTH TWICE DAILY.  Marland Kitchen escitalopram (LEXAPRO) 20 MG tablet Take 20 mg by mouth daily.  . metolazone (ZAROXOLYN) 10 MG tablet Take 1 tablet (10 mg total) by mouth daily.  Marland Kitchen  Multiple Vitamin (MULTIVITAMIN) tablet Take 1 tablet by mouth daily.  . nitroGLYCERIN (NITROSTAT) 0.4 MG SL tablet Place 0.4 mg under the tongue every 5 (five) minutes as needed for chest pain. Reported on 06/12/2016  . pantoprazole (PROTONIX) 40 MG tablet Take 1 tablet (40 mg total) by mouth daily.  Marland Kitchen rOPINIRole (REQUIP) 0.25 MG tablet Take 3 tablets (0.75 mg total) by mouth at bedtime as needed (restless legs).  . [DISCONTINUED] doxepin (SINEQUAN) 10 MG capsule Take 10 mg by mouth at bedtime.  . [DISCONTINUED] furosemide (LASIX) 40 MG tablet Take 1 tablet (40 mg total) by mouth daily.   No facility-administered encounter  medications on file as of 04/18/2017.     No Known Allergies  Review of Systems  Constitutional: Positive for fatigue and unexpected weight change. Negative for activity change and appetite change.  HENT: Positive for dental problem. Negative for congestion.   Eyes: Negative for photophobia and visual disturbance.  Respiratory: Negative for cough and shortness of breath.        Denies although she is mildly dyspneic  Cardiovascular: Positive for leg swelling. Negative for chest pain and palpitations.  Gastrointestinal: Positive for abdominal distention. Negative for constipation and diarrhea.  Genitourinary: Negative for difficulty urinating and frequency.  Musculoskeletal: Positive for gait problem. Negative for arthralgias and back pain.  Neurological: Negative for dizziness and facial asymmetry.  Psychiatric/Behavioral: Positive for sleep disturbance. The patient is nervous/anxious.        Long-standing    BP 112/70 (BP Location: Left Arm, Patient Position: Sitting, Cuff Size: Normal)   Pulse 74   Temp 97.7 F (36.5 C) (Temporal)   Resp 20   Ht '5\' 2"'$  (1.575 m)   Wt 165 lb 0.6 oz (74.9 kg)   SpO2 (!) 88%   BMI 30.19 kg/m   Physical Exam  Constitutional: She is oriented to person, place, and time. She appears well-developed and well-nourished.  HENT:  Head: Normocephalic and atraumatic.  Right Ear: External ear normal.  Left Ear: External ear normal.  Mouth/Throat: Oropharynx is clear and moist.  Eyes: Pupils are equal, round, and reactive to light.  Neck: Normal range of motion. No thyromegaly present.  Cardiovascular: Normal rate and normal heart sounds.   Pulmonary/Chest: Effort normal.  Mildly breathless with conversation. Smells of tobacco Diminished breath sounds  Abdominal: Soft. Bowel sounds are normal. She exhibits no distension.  Obese abdomen  Musculoskeletal: She exhibits edema.  Pitting edema to knee  Lymphadenopathy:    She has no cervical adenopathy.    Neurological: She is alert and oriented to person, place, and time.  Skin:  Tan. Discussed sunscreen  Psychiatric: She has a normal mood and affect. Her behavior is normal.    ASSESSMENT/PLAN:  1. Pedal edema 120 mg of furosemide isn't helping her edema and going to change her to Zaroxolyn 10 mg a day. She will do this for 3 days and then call me on Monday. - BASIC METABOLIC PANEL WITH GFR  2. Hypoxemia She needs to quit smoking. Discussed with her with my strong recommendation.  3. Tobacco abuse   4. Restless legs   5. Nocturnal leg cramps Recommend stretching at bedtime. Advil PM.   Patient Instructions  STOP SALTING FOOD NO MORE THAN ONE GLASS OF SWEET TEA A DAY Drink water  Elevate legs Stop the furosemide Tale the metaxalone once a day call if not improving in 3-4 days This is a different fluid pill Need labs today  Try advil pm at  bedtime   Low-Sodium Eating Plan Sodium, which is an element that makes up salt, helps you maintain a healthy balance of fluids in your body. Too much sodium can increase your blood pressure and cause fluid and waste to be held in your body. Your health care provider or dietitian may recommend following this plan if you have high blood pressure (hypertension), kidney disease, liver disease, or heart failure. Eating less sodium can help lower your blood pressure, reduce swelling, and protect your heart, liver, and kidneys. What are tips for following this plan? General guidelines   Most people on this plan should limit their sodium intake to 1,500-2,000 mg (milligrams) of sodium each day. Reading food labels   The Nutrition Facts label lists the amount of sodium in one serving of the food. If you eat more than one serving, you must multiply the listed amount of sodium by the number of servings.  Choose foods with less than 140 mg of sodium per serving.  Avoid foods with 300 mg of sodium or more per serving. Shopping   Look for  lower-sodium products, often labeled as "low-sodium" or "no salt added."  Always check the sodium content even if foods are labeled as "unsalted" or "no salt added".  Buy fresh foods.  Avoid canned foods and premade or frozen meals.  Avoid canned, cured, or processed meats  Buy breads that have less than 80 mg of sodium per slice. Cooking   Eat more home-cooked food and less restaurant, buffet, and fast food.  Avoid adding salt when cooking. Use salt-free seasonings or herbs instead of table salt or sea salt. Check with your health care provider or pharmacist before using salt substitutes.  Cook with plant-based oils, such as canola, sunflower, or olive oil. Meal planning   When eating at a restaurant, ask that your food be prepared with less salt or no salt, if possible.  Avoid foods that contain MSG (monosodium glutamate). MSG is sometimes added to [place] food, bouillon, and some canned foods. What foods are recommended? The items listed may not be a complete list. Talk with your dietitian about what dietary choices are best for you. Grains  Low-sodium cereals, including oats, puffed wheat and rice, and shredded wheat. Low-sodium crackers. Unsalted rice. Unsalted pasta. Low-sodium bread. Whole-grain breads and whole-grain pasta. Vegetables  Fresh or frozen vegetables. "No salt added" canned vegetables. "No salt added" tomato sauce and paste. Low-sodium or reduced-sodium tomato and vegetable juice. Fruits  Fresh, frozen, or canned fruit. Fruit juice. Meats and other protein foods  Fresh or frozen (no salt added) meat, poultry, seafood, and fish. Low-sodium canned tuna and salmon. Unsalted nuts. Dried peas, beans, and lentils without added salt. Unsalted canned beans. Eggs. Unsalted nut butters. Dairy  Milk. Soy milk. Cheese that is naturally low in sodium, such as ricotta cheese, fresh mozzarella, or Swiss cheese Low-sodium or reduced-sodium cheese. Cream cheese. Yogurt. Fats and  oils  Unsalted butter. Unsalted margarine with no trans fat. Vegetable oils such as canola or olive oils. Seasonings and other foods  Fresh and dried herbs and spices. Salt-free seasonings. Low-sodium mustard and ketchup. Sodium-free salad dressing. Sodium-free light mayonnaise. Fresh or refrigerated horseradish. Lemon juice. Vinegar. Homemade, reduced-sodium, or low-sodium soups. Unsalted popcorn and pretzels. Low-salt or salt-free chips. What foods are not recommended? The items listed may not be a complete list. Talk with your dietitian about what dietary choices are best for you. Grains  Instant hot cereals. Bread stuffing, pancake, and biscuit mixes. Croutons.  Seasoned rice or pasta mixes. Noodle soup cups. Boxed or frozen macaroni and cheese. Regular salted crackers. Self-rising flour. Vegetables  Sauerkraut, pickled vegetables, and relishes. Olives. [place] fries. Onion rings. Regular canned vegetables (not low-sodium or reduced-sodium). Regular canned tomato sauce and paste (not low-sodium or reduced-sodium). Regular tomato and vegetable juice (not low-sodium or reduced-sodium). Frozen vegetables in sauces. Meats and other protein foods  Meat or fish that is salted, canned, smoked, spiced, or pickled. Bacon, ham, sausage, hotdogs, corned beef, chipped beef, packaged lunch meats, salt pork, jerky, pickled herring, anchovies, regular canned tuna, sardines, salted nuts. Dairy  Processed cheese and cheese spreads. Cheese curds. Blue cheese. Feta cheese. String cheese. Regular cottage cheese. Buttermilk. Canned milk. Fats and oils  Salted butter. Regular margarine. Ghee. Bacon fat. Seasonings and other foods  Onion salt, garlic salt, seasoned salt, table salt, and sea salt. Canned and packaged gravies. Worcestershire sauce. Tartar sauce. Barbecue sauce. Teriyaki sauce. Soy sauce, including reduced-sodium. Steak sauce. Fish sauce. Oyster sauce. Cocktail sauce. Horseradish that you find on the  shelf. Regular ketchup and mustard. Meat flavorings and tenderizers. Bouillon cubes. Hot sauce and Tabasco sauce. Premade or packaged marinades. Premade or packaged taco seasonings. Relishes. Regular salad dressings. Salsa. Potato and tortilla chips. Corn chips and puffs. Salted popcorn and pretzels. Canned or dried soups. Pizza. Frozen entrees and pot pies. Summary  Eating less sodium can help lower your blood pressure, reduce swelling, and protect your heart, liver, and kidneys.  Most people on this plan should limit their sodium intake to 1,500-2,000 mg (milligrams) of sodium each day.  Canned, boxed, and frozen foods are high in sodium. Restaurant foods, fast foods, and pizza are also very high in sodium. You also get sodium by adding salt to food.  Try to cook at home, eat more fresh fruits and vegetables, and eat less fast food, canned, processed, or prepared foods. This information is not intended to replace advice given to you by your health care provider. Make sure you discuss any questions you have with your health care provider. Document Released: 05/10/2002 Document Revised: 11/11/2016 Document Reviewed: 11/11/2016 Elsevier Interactive Patient Education  2017 [employer]    [name], MD

## 2017-04-18 NOTE — Patient Instructions (Addendum)
STOP SALTING FOOD NO MORE THAN ONE GLASS OF SWEET TEA A DAY Drink water  Elevate legs Stop the furosemide Tale the metaxalone once a day call if not improving in 3-4 days This is a different fluid pill Need labs today  Try advil pm at bedtime   Low-Sodium Eating Plan Sodium, which is an element that makes up salt, helps you maintain a healthy balance of fluids in your body. Too much sodium can increase your blood pressure and cause fluid and waste to be held in your body. Your health care provider or dietitian may recommend following this plan if you have high blood pressure (hypertension), kidney disease, liver disease, or heart failure. Eating less sodium can help lower your blood pressure, reduce swelling, and protect your heart, liver, and kidneys. What are tips for following this plan? General guidelines   Most people on this plan should limit their sodium intake to 1,500-2,000 mg (milligrams) of sodium each day. Reading food labels   The Nutrition Facts label lists the amount of sodium in one serving of the food. If you eat more than one serving, you must multiply the listed amount of sodium by the number of servings.  Choose foods with less than 140 mg of sodium per serving.  Avoid foods with 300 mg of sodium or more per serving. Shopping   Look for lower-sodium products, often labeled as "low-sodium" or "no salt added."  Always check the sodium content even if foods are labeled as "unsalted" or "no salt added".  Buy fresh foods.  Avoid canned foods and premade or frozen meals.  Avoid canned, cured, or processed meats  Buy breads that have less than 80 mg of sodium per slice. Cooking   Eat more home-cooked food and less restaurant, buffet, and fast food.  Avoid adding salt when cooking. Use salt-free seasonings or herbs instead of table salt or sea salt. Check with your health care provider or pharmacist before using salt substitutes.  Cook with plant-based oils,  such as canola, sunflower, or olive oil. Meal planning   When eating at a restaurant, ask that your food be prepared with less salt or no salt, if possible.  Avoid foods that contain MSG (monosodium glutamate). MSG is sometimes added to [place] food, bouillon, and some canned foods. What foods are recommended? The items listed may not be a complete list. Talk with your dietitian about what dietary choices are best for you. Grains  Low-sodium cereals, including oats, puffed wheat and rice, and shredded wheat. Low-sodium crackers. Unsalted rice. Unsalted pasta. Low-sodium bread. Whole-grain breads and whole-grain pasta. Vegetables  Fresh or frozen vegetables. "No salt added" canned vegetables. "No salt added" tomato sauce and paste. Low-sodium or reduced-sodium tomato and vegetable juice. Fruits  Fresh, frozen, or canned fruit. Fruit juice. Meats and other protein foods  Fresh or frozen (no salt added) meat, poultry, seafood, and fish. Low-sodium canned tuna and salmon. Unsalted nuts. Dried peas, beans, and lentils without added salt. Unsalted canned beans. Eggs. Unsalted nut butters. Dairy  Milk. Soy milk. Cheese that is naturally low in sodium, such as ricotta cheese, fresh mozzarella, or Swiss cheese Low-sodium or reduced-sodium cheese. Cream cheese. Yogurt. Fats and oils  Unsalted butter. Unsalted margarine with no trans fat. Vegetable oils such as canola or olive oils. Seasonings and other foods  Fresh and dried herbs and spices. Salt-free seasonings. Low-sodium mustard and ketchup. Sodium-free salad dressing. Sodium-free light mayonnaise. Fresh or refrigerated horseradish. Lemon juice. Vinegar. Homemade, reduced-sodium, or low-sodium  soups. Unsalted popcorn and pretzels. Low-salt or salt-free chips. What foods are not recommended? The items listed may not be a complete list. Talk with your dietitian about what dietary choices are best for you. Grains  Instant hot cereals. Bread stuffing,  pancake, and biscuit mixes. Croutons. Seasoned rice or pasta mixes. Noodle soup cups. Boxed or frozen macaroni and cheese. Regular salted crackers. Self-rising flour. Vegetables  Sauerkraut, pickled vegetables, and relishes. Olives. [place] fries. Onion rings. Regular canned vegetables (not low-sodium or reduced-sodium). Regular canned tomato sauce and paste (not low-sodium or reduced-sodium). Regular tomato and vegetable juice (not low-sodium or reduced-sodium). Frozen vegetables in sauces. Meats and other protein foods  Meat or fish that is salted, canned, smoked, spiced, or pickled. Bacon, ham, sausage, hotdogs, corned beef, chipped beef, packaged lunch meats, salt pork, jerky, pickled herring, anchovies, regular canned tuna, sardines, salted nuts. Dairy  Processed cheese and cheese spreads. Cheese curds. Blue cheese. Feta cheese. String cheese. Regular cottage cheese. Buttermilk. Canned milk. Fats and oils  Salted butter. Regular margarine. Ghee. Bacon fat. Seasonings and other foods  Onion salt, garlic salt, seasoned salt, table salt, and sea salt. Canned and packaged gravies. Worcestershire sauce. Tartar sauce. Barbecue sauce. Teriyaki sauce. Soy sauce, including reduced-sodium. Steak sauce. Fish sauce. Oyster sauce. Cocktail sauce. Horseradish that you find on the shelf. Regular ketchup and mustard. Meat flavorings and tenderizers. Bouillon cubes. Hot sauce and Tabasco sauce. Premade or packaged marinades. Premade or packaged taco seasonings. Relishes. Regular salad dressings. Salsa. Potato and tortilla chips. Corn chips and puffs. Salted popcorn and pretzels. Canned or dried soups. Pizza. Frozen entrees and pot pies. Summary  Eating less sodium can help lower your blood pressure, reduce swelling, and protect your heart, liver, and kidneys.  Most people on this plan should limit their sodium intake to 1,500-2,000 mg (milligrams) of sodium each day.  Canned, boxed, and frozen foods are high in  sodium. Restaurant foods, fast foods, and pizza are also very high in sodium. You also get sodium by adding salt to food.  Try to cook at home, eat more fresh fruits and vegetables, and eat less fast food, canned, processed, or prepared foods. This information is not intended to replace advice given to you by your health care provider. Make sure you discuss any questions you have with your health care provider. Document Released: 05/10/2002 Document Revised: 11/11/2016 Document Reviewed: 11/11/2016 Elsevier Interactive Patient Education  2017 [employer].

## 2017-04-19 LAB — BASIC METABOLIC PANEL WITH GFR
BUN: 20 mg/dL (ref 7–25)
CALCIUM: 9.1 mg/dL (ref 8.6–10.4)
CO2: 30 mmol/L (ref 20–31)
Chloride: 102 mmol/L (ref 98–110)
Creat: 0.89 mg/dL (ref 0.50–0.99)
GFR, Est African American: 78 mL/min (ref >=60)
GFR, Est Non African American: 67 mL/min (ref >=60)
Glucose, Bld: 119 mg/dL — ABNORMAL HIGH (ref 65–99)
Potassium: 3.8 mmol/L (ref 3.5–5.3)
SODIUM: 142 mmol/L (ref 135–146)

## 2017-04-21 ENCOUNTER — Telehealth: Payer: Self-pay

## 2017-04-21 NOTE — Telephone Encounter (Signed)
Called and spoke to Arlicia, clarified orders on her lasix and zaroxolyn. Pt instructed to call with an update on Friday, sooner if needed.

## 2017-04-22 ENCOUNTER — Other Ambulatory Visit: Payer: Self-pay

## 2017-04-22 MED ORDER — BUSPIRONE HCL 7.5 MG PO TABS: 7.5000 mg | ORAL_TABLET | Freq: Three times a day (TID) | ORAL | 1 refills | Status: DC

## 2017-04-23 ENCOUNTER — Other Ambulatory Visit: Payer: Self-pay | Admitting: Family Medicine

## 2017-04-25 ENCOUNTER — Telehealth: Payer: Self-pay

## 2017-04-25 ENCOUNTER — Encounter: Payer: Self-pay | Admitting: *Deleted

## 2017-04-25 NOTE — Telephone Encounter (Signed)
Aflutter albation scheduled for 6/18. Pre procedure lab 6/11. 4 wk post ablation f/u 7/24. Letter of instructions reviewed with patient and sent via [place]. Patient verbalized understanding and agreeable to plan.

## 2017-04-25 NOTE — Telephone Encounter (Signed)
Call her back regarding her swelling again

## 2017-04-29 ENCOUNTER — Telehealth: Payer: Self-pay | Admitting: Family Medicine

## 2017-04-29 NOTE — Telephone Encounter (Signed)
Patient left message on voice mail 04/25/17 2:49 that she would like to go back on Furosemide like she was taking before because the new Rx that you put her on makes her constipated and she has difficulty urinating.

## 2017-04-30 NOTE — Telephone Encounter (Signed)
Called Tonya Bradley, states the lasix works better for her, so she has "put herself back on them". And she said her swelling is better.Marland Kitchen

## 2017-05-05 ENCOUNTER — Telehealth: Payer: Self-pay | Admitting: Family Medicine

## 2017-05-05 NOTE — Telephone Encounter (Signed)
Needs visit

## 2017-05-05 NOTE — Telephone Encounter (Signed)
Patient is requesting a referral to an orthopedic, says shes been having hip pain for 2 weeks.  Cb#: [phone] if need to discuss

## 2017-05-07 ENCOUNTER — Other Ambulatory Visit: Payer: Medicare HMO

## 2017-05-07 ENCOUNTER — Other Ambulatory Visit: Payer: Self-pay | Admitting: Family Medicine

## 2017-05-07 NOTE — Telephone Encounter (Signed)
LEFT A VOICEMAIL FOR PATIENT TO CALL IN / MAKE APPT.

## 2017-05-07 NOTE — Telephone Encounter (Signed)
Are you refilling this?

## 2017-05-08 ENCOUNTER — Encounter: Payer: Self-pay | Admitting: Family Medicine

## 2017-05-08 ENCOUNTER — Ambulatory Visit (INDEPENDENT_AMBULATORY_CARE_PROVIDER_SITE_OTHER): Payer: Medicare HMO | Admitting: Family Medicine

## 2017-05-08 ENCOUNTER — Ambulatory Visit (HOSPITAL_COMMUNITY)
Admission: RE | Admit: 2017-05-08 | Discharge: 2017-05-08 | Disposition: A | Discharge status: Home / Self Care | Payer: Medicare HMO | Source: Ambulatory Visit | Attending: Family Medicine | Admitting: Family Medicine

## 2017-05-08 VITALS — BP 120/64 | HR 88 | Temp 98.3°F | Resp 18 | Ht 62.0 in | Wt 164.1 lb

## 2017-05-08 DIAGNOSIS — F1320 Sedative, hypnotic or anxiolytic dependence, uncomplicated: Secondary | ICD-10-CM | POA: Diagnosis not present

## 2017-05-08 DIAGNOSIS — M5136 Other intervertebral disc degeneration, lumbar region: Secondary | ICD-10-CM | POA: Insufficient documentation

## 2017-05-08 DIAGNOSIS — R600 Localized edema: Secondary | ICD-10-CM | POA: Diagnosis not present

## 2017-05-08 DIAGNOSIS — M25552 Pain in left hip: Secondary | ICD-10-CM | POA: Diagnosis present

## 2017-05-08 MED ORDER — ALPRAZOLAM 0.5 MG PO TABS: 0.5000 mg | ORAL_TABLET | Freq: Every evening | ORAL | 0 refills | Status: DC | PRN

## 2017-05-08 MED ORDER — METOLAZONE 10 MG PO TABS: 20.0000 mg | ORAL_TABLET | Freq: Every day | ORAL | 0 refills | Status: DC

## 2017-05-08 MED ORDER — PREDNISONE 20 MG PO TABS: ORAL_TABLET | ORAL | 0 refills | Status: DC

## 2017-05-08 NOTE — Patient Instructions (Signed)
I will refill the xanax to help through the surgery Take 2 fluid pills a day Elevate legs Continue to avoid salt  Take the prednisone as directed\this is for the hip/back pain Call for problems

## 2017-05-08 NOTE — Progress Notes (Signed)
Chief Complaint  Patient presents with  . Back Pain  . Foot Swelling   Here for follow up edema Took lasix 120 a day and still had swelling, changed to zaroxolyn 10 a day and stil l has swelling Will increase to 20 - and follow.  May need to add spironolactone Will not reduce salt or stop drinking sweet tea or stop smoking.  Unhealthy lifestyle discussed Has pain in the R sacroiliac and buttocks for a week.  No accident or injury.  No numbness or weakness. No prior history back problems Complains I stopped her "nerve pills.  Has benzodiazepine dependence for years. I have tapered over many months.  May have one more Rx with upcoming surgery to help sleep.  NO MORE  Patient Active Problem List   Diagnosis Date Noted  . Osteopenia 11/26/2016  . Other emphysema ([place]) 11/07/2016  . Chronic anxiety 11/07/2016  . 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 Constipation 06/12/2016  . Right carotid bruit 04/03/2016  . CAD (coronary artery disease) 04/03/2016  . Tobacco abuse 04/03/2016  . Atrial flutter ([place]) 03/13/2016  . Hiatal hernia   . Reflux esophagitis   . Schatzki's ring   . Dysphagia 03/01/2016  . Solitary pulmonary nodule 03/01/2016  . 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 Microscopic colitis 08/15/2014  . Loose stools 06/08/2014  . BCC (basal cell carcinoma), face 09/13/2012    Outpatient Encounter Prescriptions as of 05/08/2017  Medication Sig  . ALPRAZolam (XANAX) 0.5 MG tablet Take 1 tablet (0.5 mg total) by mouth at bedtime as needed for sleep.  Marland Kitchen amiodarone (PACERONE) 200 MG tablet Take 1 tablet (200 mg total) by mouth daily.  Marland Kitchen atorvastatin (LIPITOR) 80 MG tablet Take 80 mg by mouth daily.  . busPIRone (BUSPAR) 7.5 MG tablet Take 1 tablet (7.5 mg total) by mouth 3 (three) times daily.  . Cholecalciferol (VITAMIN D) 2000 units CAPS Take 2,000 Units by mouth daily.  Marland Kitchen doxepin (SINEQUAN) 10 MG capsule Take 1 capsule (10 mg total) by mouth at  bedtime.  Marland Kitchen ELIQUIS 5 MG TABS tablet TAKE (1) TABLET BY MOUTH TWICE DAILY.  Marland Kitchen escitalopram (LEXAPRO) 20 MG tablet Take 20 mg by mouth daily.  . metolazone (ZAROXOLYN) 10 MG tablet Take 2 tablets (20 mg total) by mouth daily.  . Multiple Vitamin (MULTIVITAMIN) tablet Take 1 tablet by mouth daily.  . nitroGLYCERIN (NITROSTAT) 0.4 MG SL tablet Place 0.4 mg under the tongue every 5 (five) minutes as needed for chest pain. Reported on 06/12/2016  . pantoprazole (PROTONIX) 40 MG tablet Take 1 tablet (40 mg total) by mouth daily.  Marland Kitchen rOPINIRole (REQUIP) 0.25 MG tablet TAKE 3 TABLETS AT BEDTIME AS NEEDED  FOR  RESTLESS  LEGS  . predniSONE (DELTASONE) 20 MG tablet Take 2 a day for 3 days then one a day for 3 days then stop   No facility-administered encounter medications on file as of 05/08/2017.     No Known Allergies  Review of Systems  Constitutional: Negative for activity change and appetite change.  HENT: Negative for congestion and dental problem.   Eyes: Negative for photophobia and visual disturbance.  Respiratory: Negative for cough and shortness of breath.        Denies  Cardiovascular: Positive for leg swelling. Negative for chest pain and palpitations.  Gastrointestinal: Negative for constipation and diarrhea.  Genitourinary: Positive for frequency. Negative for difficulty urinating.       On medicine  Musculoskeletal: Positive for  arthralgias and back pain.  Neurological: Negative for dizziness and light-headedness.  Psychiatric/Behavioral: Positive for sleep disturbance. Negative for dysphoric mood. The patient is nervous/anxious.     BP 120/64 (BP Location: Right Arm, Patient Position: Sitting, Cuff Size: Normal)   Pulse 88   Temp 98.3 F (36.8 C) (Temporal)   Resp 18   Ht 5\' 2"  (1.575 m)   Wt 164 lb 1.3 oz (74.4 kg)   SpO2 (!) 87%   BMI 30.01 kg/m   Physical Exam  Constitutional: She is oriented to person, place, and time. She appears well-developed and well-nourished.    HENT:  Head: Normocephalic and atraumatic.  Mouth/Throat: Oropharynx is clear and moist.  Eyes: Pupils are equal, round, and reactive to light.  Neck: Normal range of motion. Neck supple.  Cardiovascular: Normal rate and normal heart sounds.   Pulmonary/Chest: Effort normal.  Mildly breathless with conversation. Smells of tobacco Diminished breath sounds  Abdominal: Soft. Bowel sounds are normal. She exhibits no distension.  Obese abdomen  Musculoskeletal: She exhibits edema.  Pitting edema to knee.  Tender over Right SI joint.  SLR increases pain.  Reflexes and strength symmetric LEs  Lymphadenopathy:    She has no cervical adenopathy.  Neurological: She is alert and oriented to person, place, and time.  Psychiatric: She has a normal mood and affect. Her behavior is normal.    ASSESSMENT/PLAN:  1. Left hip pain SI pain, possible radiculopathy - DG Lumbar Spine Complete; Future  2. Pedal edema Increase diuretic  3. 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   Is advised to MAKE IT LAST   Patient Instructions  I will refill the xanax to help through the surgery Take 2 fluid pills a day Elevate legs Continue to avoid salt  Take the prednisone as directed\this is for the hip/back pain Call for problems   [name], MD

## 2017-05-12 ENCOUNTER — Other Ambulatory Visit: Payer: Medicare HMO | Admitting: *Deleted

## 2017-05-12 DIAGNOSIS — Z01812 Encounter for preprocedural laboratory examination: Secondary | ICD-10-CM

## 2017-05-12 DIAGNOSIS — I483 Typical atrial flutter: Secondary | ICD-10-CM

## 2017-05-12 LAB — CBC WITH DIFFERENTIAL/PLATELET
BASOS ABS: 0 10*3/uL (ref 0.0–0.2)
BASOS: 0 %
EOS (ABSOLUTE): 0 10*3/uL (ref 0.0–0.4)
Eos: 0 %
HEMOGLOBIN: 12.6 g/dL (ref 11.1–15.9)
Hematocrit: 36.6 % (ref 34.0–46.6)
Immature Grans (Abs): 0.1 10*3/uL (ref 0.0–0.1)
Immature Granulocytes: 1 %
Lymphocytes Absolute: 2 10*3/uL (ref 0.7–3.1)
Lymphs: 17 %
MCH: 30 pg (ref 26.6–33.0)
MCHC: 34.4 g/dL (ref 31.5–35.7)
MCV: 87 fL (ref 79–97)
MONOCYTES: 7 %
Monocytes Absolute: 0.8 10*3/uL (ref 0.1–0.9)
Neutrophils Absolute: 8.9 10*3/uL — ABNORMAL HIGH (ref 1.4–7.0)
Neutrophils: 75 %
Platelets: 335 10*3/uL (ref 150–379)
RBC: 4.2 x10E6/uL (ref 3.77–5.28)
RDW: 14 % (ref 12.3–15.4)
WBC: 11.7 10*3/uL — ABNORMAL HIGH (ref 3.4–10.8)

## 2017-05-12 LAB — BASIC METABOLIC PANEL
BUN/Creatinine Ratio: 21 (ref 12–28)
BUN: 18 mg/dL (ref 8–27)
CHLORIDE: 92 mmol/L — AB (ref 96–106)
CO2: 29 mmol/L (ref 20–29)
Calcium: 9.8 mg/dL (ref 8.7–10.3)
Creatinine, Ser: 0.84 mg/dL (ref 0.57–1.00)
GFR calc non Af Amer: 72 mL/min/{1.73_m2} (ref >59)
GFR, EST AFRICAN AMERICAN: 83 mL/min/{1.73_m2} (ref >59)
Glucose: 108 mg/dL — ABNORMAL HIGH (ref 65–99)
POTASSIUM: 3.4 mmol/L — AB (ref 3.5–5.2)
Sodium: 137 mmol/L (ref 134–144)

## 2017-05-13 ENCOUNTER — Telehealth: Payer: Self-pay

## 2017-05-13 NOTE — Telephone Encounter (Signed)
Pt left message looking for results of back/ hip x ray.  Also fyi her feet are back to " normal "

## 2017-05-16 ENCOUNTER — Telehealth: Payer: Self-pay | Admitting: Family Medicine

## 2017-05-16 NOTE — Telephone Encounter (Signed)
Patient left a msg on voicemail:   Patient is requesting pain medication for back/hip pain.  Cb#: [phone]

## 2017-05-19 ENCOUNTER — Ambulatory Visit (HOSPITAL_COMMUNITY)
Admission: RE | Admit: 2017-05-19 | Discharge: 2017-05-19 | Disposition: A | Discharge status: Home / Self Care | Payer: Medicare HMO | Source: Ambulatory Visit | Attending: Internal Medicine | Admitting: Internal Medicine

## 2017-05-19 ENCOUNTER — Encounter (HOSPITAL_COMMUNITY)
Admission: RE | Disposition: A | Discharge status: Home / Self Care | Payer: Self-pay | Source: Ambulatory Visit | Attending: Internal Medicine

## 2017-05-19 DIAGNOSIS — Z7901 Long term (current) use of anticoagulants: Secondary | ICD-10-CM | POA: Insufficient documentation

## 2017-05-19 DIAGNOSIS — I483 Typical atrial flutter: Secondary | ICD-10-CM | POA: Diagnosis not present

## 2017-05-19 DIAGNOSIS — F17210 Nicotine dependence, cigarettes, uncomplicated: Secondary | ICD-10-CM | POA: Insufficient documentation

## 2017-05-19 DIAGNOSIS — E7800 Pure hypercholesterolemia, unspecified: Secondary | ICD-10-CM | POA: Diagnosis not present

## 2017-05-19 DIAGNOSIS — R0902 Hypoxemia: Secondary | ICD-10-CM | POA: Diagnosis not present

## 2017-05-19 DIAGNOSIS — I2510 Atherosclerotic heart disease of native coronary artery without angina pectoris: Secondary | ICD-10-CM | POA: Insufficient documentation

## 2017-05-19 DIAGNOSIS — K219 Gastro-esophageal reflux disease without esophagitis: Secondary | ICD-10-CM | POA: Insufficient documentation

## 2017-05-19 DIAGNOSIS — Z955 Presence of coronary angioplasty implant and graft: Secondary | ICD-10-CM | POA: Diagnosis not present

## 2017-05-19 DIAGNOSIS — Z8249 Family history of ischemic heart disease and other diseases of the circulatory system: Secondary | ICD-10-CM | POA: Diagnosis not present

## 2017-05-19 DIAGNOSIS — F419 Anxiety disorder, unspecified: Secondary | ICD-10-CM | POA: Insufficient documentation

## 2017-05-19 DIAGNOSIS — J449 Chronic obstructive pulmonary disease, unspecified: Secondary | ICD-10-CM | POA: Diagnosis not present

## 2017-05-19 DIAGNOSIS — I4892 Unspecified atrial flutter: Secondary | ICD-10-CM | POA: Diagnosis present

## 2017-05-19 DIAGNOSIS — M810 Age-related osteoporosis without current pathological fracture: Secondary | ICD-10-CM | POA: Insufficient documentation

## 2017-05-19 DIAGNOSIS — F329 Major depressive disorder, single episode, unspecified: Secondary | ICD-10-CM | POA: Diagnosis not present

## 2017-05-19 HISTORY — PX: A-FLUTTER ABLATION: EP1230

## 2017-05-19 LAB — POTASSIUM: Potassium: 2.8 mmol/L — ABNORMAL LOW (ref 3.5–5.1)

## 2017-05-19 SURGERY — A-FLUTTER ABLATION

## 2017-05-19 MED ORDER — BUPIVACAINE HCL (PF) 0.25 % IJ SOLN
INTRAMUSCULAR | Status: DC | PRN
  Administered 2017-05-19: 20 mL

## 2017-05-19 MED ORDER — HEPARIN (PORCINE) IN NACL 2-0.9 UNIT/ML-% IJ SOLN
INTRAMUSCULAR | Status: AC | PRN
  Administered 2017-05-19: 500 mL

## 2017-05-19 MED ORDER — POTASSIUM CHLORIDE CRYS ER 20 MEQ PO TBCR
40.0000 meq | EXTENDED_RELEASE_TABLET | Freq: Once | ORAL | Status: AC
  Administered 2017-05-19: 40 meq via ORAL
  Filled 2017-05-19: qty 2

## 2017-05-19 MED ORDER — SODIUM CHLORIDE 0.9 % IV SOLN
INTRAVENOUS | Status: DC
  Administered 2017-05-19: 07:00:00 via INTRAVENOUS

## 2017-05-19 MED ORDER — ACETAMINOPHEN 325 MG PO TABS: 650.0000 mg | ORAL_TABLET | ORAL | Status: DC | PRN

## 2017-05-19 MED ORDER — FENTANYL CITRATE (PF) 100 MCG/2ML IJ SOLN
INTRAMUSCULAR | Status: DC | PRN
  Administered 2017-05-19 (×2): 25 ug via INTRAVENOUS
  Administered 2017-05-19: 12.5 ug via INTRAVENOUS
  Administered 2017-05-19: 25 ug via INTRAVENOUS

## 2017-05-19 MED ORDER — MIDAZOLAM HCL 5 MG/5ML IJ SOLN
INTRAMUSCULAR | Status: AC
  Filled 2017-05-19: qty 5

## 2017-05-19 MED ORDER — FENTANYL CITRATE (PF) 100 MCG/2ML IJ SOLN
INTRAMUSCULAR | Status: AC
  Filled 2017-05-19: qty 2

## 2017-05-19 MED ORDER — ONDANSETRON HCL 4 MG/2ML IJ SOLN: 4.0000 mg | Freq: Four times a day (QID) | INTRAMUSCULAR | Status: DC | PRN

## 2017-05-19 MED ORDER — BUPIVACAINE HCL (PF) 0.25 % IJ SOLN
INTRAMUSCULAR | Status: AC
  Filled 2017-05-19: qty 30

## 2017-05-19 MED ORDER — SODIUM CHLORIDE 0.9 % IV SOLN: 250.0000 mL | INTRAVENOUS | Status: DC | PRN

## 2017-05-19 MED ORDER — HEPARIN (PORCINE) IN NACL 2-0.9 UNIT/ML-% IJ SOLN
INTRAMUSCULAR | Status: AC
  Filled 2017-05-19: qty 500

## 2017-05-19 MED ORDER — LIDOCAINE HCL 2 % IJ SOLN
0.1000 mL | Freq: Once | INTRAMUSCULAR | Status: AC
  Administered 2017-05-19: 2 mg via INTRADERMAL

## 2017-05-19 MED ORDER — SODIUM CHLORIDE 0.9% FLUSH: 3.0000 mL | Freq: Two times a day (BID) | INTRAVENOUS | Status: DC

## 2017-05-19 MED ORDER — SODIUM CHLORIDE 0.9% FLUSH: 3.0000 mL | INTRAVENOUS | Status: DC | PRN

## 2017-05-19 MED ORDER — MIDAZOLAM HCL 5 MG/5ML IJ SOLN
INTRAMUSCULAR | Status: DC | PRN
  Administered 2017-05-19: 1 mg via INTRAVENOUS
  Administered 2017-05-19: 2 mg via INTRAVENOUS
  Administered 2017-05-19 (×2): 1 mg via INTRAVENOUS
  Administered 2017-05-19: 2 mg via INTRAVENOUS
  Administered 2017-05-19: 1 mg via INTRAVENOUS

## 2017-05-19 MED ORDER — POTASSIUM CHLORIDE 20 MEQ/15ML (10%) PO SOLN
ORAL | Status: AC
  Filled 2017-05-19: qty 30

## 2017-05-19 SURGICAL SUPPLY — 9 items
CATH BLAZERPRIME XP (ABLATOR) ×3 IMPLANT
CATH JOSEPHSON QUAD-ALLRED 6FR (CATHETERS) ×3 IMPLANT
CATH POLARIS X 2.5/5/2.5 DECAP (CATHETERS) ×3 IMPLANT
PACK EP LATEX FREE (CUSTOM PROCEDURE TRAY) ×2
PACK EP LF (CUSTOM PROCEDURE TRAY) ×1 IMPLANT
PAD DEFIB LIFELINK (PAD) ×3 IMPLANT
SHEATH PINNACLE 6F 10CM (SHEATH) ×3 IMPLANT
SHEATH PINNACLE 8F 10CM (SHEATH) ×6 IMPLANT
SHIELD RADPAD SCOOP 12X17 (MISCELLANEOUS) ×3 IMPLANT

## 2017-05-19 NOTE — Progress Notes (Signed)
Site area: Right groin a 6, and 8 french venous sheaths X2 was removed  Site Prior to Removal:  Level 0  Pressure Applied For 15 MINUTES    Bedrest Beginning at 0940am  Manual:   Yes.    Patient Status During Pull:  stable  Post Pull Groin Site:  Level 0  Post Pull Instructions Given:  Yes.    Post Pull Pulses Present:  Yes.    Dressing Applied:  Yes.    Comments:  VS remain stable during sheath pull

## 2017-05-19 NOTE — Telephone Encounter (Signed)
Left message for Tonya Bradley to call office.

## 2017-05-19 NOTE — Progress Notes (Signed)
Jossie Ng from [employer] called to notify that her finding on ECG were a QTC of 529.  Notified Dr. Tanna Furry PA Luetta Nutting.

## 2017-05-19 NOTE — H&P (Signed)
HPI Tonya Bradley is referred today for evaluation of atrial flutter. She developed atrial flutter with a RVR about a year ago and was placed on amiodarone. She has been anti-coagulated. She had recurrent flutter about 6 months ago and felt her heart racing associated with syncope. She has been stable since then. She has been anti-coagulated since then. She and her other MD's have been concerned about the side effects of long term amiodarone. No chest pain. She has remote CAD, s/p stenting. No Known Allergies         Current Outpatient Prescriptions  Medication Sig Dispense Refill  . ALPRAZolam (XANAX) 0.5 MG tablet Take 0.5 mg by mouth at bedtime as needed for sleep.    Marland Kitchen amiodarone (PACERONE) 200 MG tablet Take 1 tablet (200 mg total) by mouth daily. 30 tablet 6  . atorvastatin (LIPITOR) 80 MG tablet Take 80 mg by mouth daily.    . busPIRone (BUSPAR) 7.5 MG tablet Take 1 tablet (7.5 mg total) by mouth 3 (three) times daily. 90 tablet 0  . Cholecalciferol (VITAMIN D) 2000 units CAPS Take 2,000 Units by mouth daily.    Marland Kitchen doxepin (SINEQUAN) 10 MG capsule Take 10 mg by mouth at bedtime.    Marland Kitchen ELIQUIS 5 MG TABS tablet TAKE (1) TABLET BY MOUTH TWICE DAILY. 60 tablet 6  . escitalopram (LEXAPRO) 20 MG tablet Take 20 mg by mouth daily.    . furosemide (LASIX) 40 MG tablet Take 1 tablet (40 mg total) by mouth daily. 90 tablet 1  . Multiple Vitamin (MULTIVITAMIN) tablet Take 1 tablet by mouth daily.    . nitroGLYCERIN (NITROSTAT) 0.4 MG SL tablet Place 0.4 mg under the tongue every 5 (five) minutes as needed for chest pain. Reported on 06/12/2016    . pantoprazole (PROTONIX) 40 MG tablet Take 1 tablet (40 mg total) by mouth daily. 90 tablet 1  . rOPINIRole (REQUIP) 0.25 MG tablet Take 3 tablets (0.75 mg total) by mouth at bedtime as needed (restless legs). 90 tablet 3   No current facility-administered medications for this visit.          Past Medical History:    Diagnosis Date  . Anemia   . Anxiety   . Atrial flutter ([place]) 03/13/2016  . CAD (coronary artery disease)   . Cancer ([place])    skin cancer  . Cataract   . Colon polyp   . COPD (chronic obstructive pulmonary disease) ([place])   . Depression   . Emphysema of lung ([place])   . GERD (gastroesophageal reflux disease)   . Hypercholesterolemia   . Osteoporosis   . Oxygen deficiency     ROS:   All systems reviewed and negative except as noted in the HPI.        Past Surgical History:  Procedure Laterality Date  . APPENDECTOMY    . BASAL CELL CARCINOMA EXCISION    . CARDIAC CATHETERIZATION    . CHOLECYSTECTOMY    . COLONOSCOPY N/A 06/23/2014   Dr. Rourk:Rectal and colonic polyps-removed, Pancolonic diverticulosis. lymphocytic colitis  . CORONARY ANGIOPLASTY WITH STENT PLACEMENT    . ESOPHAGEAL DILATION N/A 03/13/2016   Procedure: ESOPHAGEAL DILATION;  Surgeon: Daneil Dolin, MD;  Location: [facility];  Service: Endoscopy;  Laterality: N/A;  . ESOPHAGOGASTRODUODENOSCOPY N/A 03/13/2016   Procedure: ESOPHAGOGASTRODUODENOSCOPY (EGD);  Surgeon: Daneil Dolin, MD;  Location: [facility];  Service: Endoscopy;  Laterality: N/A;  0930  Family History  Problem Relation Age of Onset  . Breast cancer Sister 94  . Heart disease Sister   . Heart attack Father   . Heart disease Father   . Stroke Mother   . Heart disease Mother   . Hyperlipidemia Mother   . Heart attack Sister   . Heart disease Brother     ?heart failure  . Congestive Heart Failure Brother   . Hypertension Son   . Heart disease Maternal Grandfather   . Cancer Cousin   . Hypertension Son   . Colon cancer Neg Hx      Social History        Social History  . Marital status: Widowed    Spouse name: N/A  . Number of children: 3  . Years of education: 10        Occupational History  . 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 Smoking status: Current Every Day Smoker    Packs/day: 1.50    Years: 45.00    Types: Cigarettes    Start date: 06/14/1966  . Smokeless tobacco: Never Used     Comment: 1-2 packs daily  . Alcohol use No  . Drug use: No  . Sexual activity: Yes    Birth control/ protection: Post-menopausal       Other Topics Concern  . Not on file      Social History Narrative   Quit high school and got married in 10th grade, age 35   First husband died of colon cancer at young age   Lives in own home   Daughter lives with her   Drinks coffee seldom, coke zero most of the time     BP 102/60 (BP Location: Left Arm)   Pulse 64   Ht 5\' 2"  (1.575 m)   Wt 166 lb 12.8 oz (75.7 kg)   BMI 30.51 kg/m   Physical Exam:  Well appearing 66 yo woman, NAD HEENT: Unremarkable Neck:  No JVD, no thyromegally Lymphatics:  No adenopathy Back:  No CVA tenderness Lungs:  Clear with no wheezes HEART:  Regular rate rhythm, no murmurs, no rubs, no clicks Abd:  soft, positive bowel sounds, no organomegally, no rebound, no guarding Ext:  2 plus pulses, no edema, no cyanosis, no clubbing Skin:  No rashes no nodules Neuro:  CN II through XII intact, motor grossly intact  EKG - nsr  Assess/Plan: 1. Atrial flutter - I have discussed the treatment options with the patient and she will call us if she wishes to proceed with ablation. I would anticipate continuing her Eliquis for 3-4 months after to be sure she did not develop atrial fib. 2. HTN - her blood pressure is well controlled. Will follow. 3. Tobacco abuse - she will be encouraged to stop smoking.  Mikle Bosworth.D

## 2017-05-19 NOTE — Discharge Instructions (Signed)
No driving for 4 days. No lifting over 5 lbs for 1 week. No sexual activity for 1 week. You may return to work in 1 week. Keep procedure site clean & dry. If you notice increased pain, swelling, bleeding or pus, call/return!  You may shower, but no soaking baths/hot tubs/pools for 1 week.    Endovenous Ablation, Care After Refer to this sheet in the next few weeks. These instructions provide you with information about caring for yourself after your procedure. Your health care provider may also give you more specific instructions. Your treatment has been planned according to current medical practices, but problems sometimes occur. Call your health care provider if you have any problems or questions after your procedure. What can I expect after the procedure? After the procedure, it is common to have:  Bruising.  Tenderness.  Follow these instructions at home:  Incision care  Follow instructions from your health care provider about how to take care of your incision. Make sure you: ? Wash your hands with soap and water before you change your bandage (dressing). If soap and water are not available, use hand sanitizer. ? Change your dressing as told by your health care provider. ? Follow instructions from your health care provider about when you should remove your dressing.  Check your incision area every day for signs of infection. Watch for: ? Redness, swelling, or pain. ? Fluid, blood, or pus.  Keep the bandage (dressing) dry until your health care provider says it can be removed. Activity   Walk regularly as told by your health care provider. This helps with healing and helps to prevent blood clots from forming.  Avoid strenuous exercise as told by your health care provider. General instructions  Take over-the-counter and prescription medicines only as told by your health care provider.  Do not take long car trips or travel by air until your health care provider has  approved.  Wear compression stockings as told by your health care provider. These stockings help to prevent blood clots and reduce swelling in your legs.  Keep all follow-up visits as told by your health care provider. This is important. Contact a health care provider if:  You have a fever.  You have redness, swelling, or pain at the site of your incision.  You have fluid, blood, or pus coming from your incision. Get help right away if:  You notice red streaks coming from the incision.  You develop nausea or vomiting.  You have trouble breathing.  You develop chest pain. This information is not intended to replace advice given to you by your health care provider. Make sure you discuss any questions you have with your health care provider. Document Released: 11/07/2011 Document Revised: 04/25/2016 Document Reviewed: 02/21/2015 Elsevier Interactive Patient Education  [employer].

## 2017-05-20 ENCOUNTER — Encounter (HOSPITAL_COMMUNITY): Payer: Self-pay | Admitting: Internal Medicine

## 2017-05-20 MED FILL — Bupivacaine HCl Preservative Free (PF) Inj 0.25%: INTRAMUSCULAR | Qty: 30 | Status: AC

## 2017-05-26 ENCOUNTER — Encounter: Payer: Self-pay | Admitting: Family Medicine

## 2017-05-26 ENCOUNTER — Ambulatory Visit (INDEPENDENT_AMBULATORY_CARE_PROVIDER_SITE_OTHER): Payer: Medicare HMO | Admitting: Family Medicine

## 2017-05-26 VITALS — BP 120/70 | HR 81 | Resp 12 | Ht 62.0 in | Wt 169.0 lb

## 2017-05-26 DIAGNOSIS — G894 Chronic pain syndrome: Secondary | ICD-10-CM | POA: Insufficient documentation

## 2017-05-26 DIAGNOSIS — F419 Anxiety disorder, unspecified: Secondary | ICD-10-CM | POA: Diagnosis not present

## 2017-05-26 DIAGNOSIS — M79604 Pain in right leg: Secondary | ICD-10-CM | POA: Diagnosis not present

## 2017-05-26 DIAGNOSIS — G8929 Other chronic pain: Secondary | ICD-10-CM

## 2017-05-26 DIAGNOSIS — M545 Low back pain: Secondary | ICD-10-CM | POA: Diagnosis not present

## 2017-05-26 DIAGNOSIS — E876 Hypokalemia: Secondary | ICD-10-CM

## 2017-05-26 DIAGNOSIS — K2100 Gastro-esophageal reflux disease with esophagitis, without bleeding: Secondary | ICD-10-CM

## 2017-05-26 DIAGNOSIS — G4700 Insomnia, unspecified: Secondary | ICD-10-CM

## 2017-05-26 DIAGNOSIS — M549 Dorsalgia, unspecified: Secondary | ICD-10-CM | POA: Insufficient documentation

## 2017-05-26 DIAGNOSIS — M79605 Pain in left leg: Secondary | ICD-10-CM

## 2017-05-26 DIAGNOSIS — R600 Localized edema: Secondary | ICD-10-CM

## 2017-05-26 LAB — BASIC METABOLIC PANEL
BUN: 14 mg/dL (ref 6–23)
CHLORIDE: 98 meq/L (ref 96–112)
CO2: 37 meq/L — AB (ref 19–32)
Calcium: 9.9 mg/dL (ref 8.4–10.5)
Creatinine, Ser: 0.91 mg/dL (ref 0.40–1.20)
GFR: 65.48 mL/min (ref >60.00)
Glucose, Bld: 105 mg/dL — ABNORMAL HIGH (ref 70–99)
POTASSIUM: 3.2 meq/L — AB (ref 3.5–5.1)
Sodium: 141 mEq/L (ref 135–145)

## 2017-05-26 MED ORDER — DULOXETINE HCL 30 MG PO CPEP: 30.0000 mg | ORAL_CAPSULE | Freq: Every day | ORAL | 1 refills | Status: DC

## 2017-05-26 MED ORDER — ALPRAZOLAM 0.5 MG PO TABS: 0.5000 mg | ORAL_TABLET | Freq: Two times a day (BID) | ORAL | 0 refills | Status: DC | PRN

## 2017-05-26 MED ORDER — ESCITALOPRAM OXALATE 20 MG PO TABS: 10.0000 mg | ORAL_TABLET | Freq: Every day | ORAL | 0 refills | Status: DC

## 2017-05-26 MED ORDER — METOLAZONE 10 MG PO TABS: 20.0000 mg | ORAL_TABLET | ORAL | 0 refills | Status: DC

## 2017-05-26 NOTE — Patient Instructions (Addendum)
A few things to remember from today's visit:   Chronic anxiety - Plan: DULoxetine (CYMBALTA) 30 MG capsule, escitalopram (LEXAPRO) 20 MG tablet, ALPRAZolam (XANAX) 0.5 MG tablet  Chronic pain disorder - Plan: DULoxetine (CYMBALTA) 30 MG capsule  Chronic bilateral low back pain, with sciatica presence unspecified - Plan: DULoxetine (CYMBALTA) 30 MG capsule, Ambulatory referral to Orthopedic Surgery  Leg pain, bilateral  Lexapro decreased to 1/2 tab. Cymblata 30 mg added. Xanax 0.5 mg 2 times daily of needed no more than 40 tbs per month. Fluid pill decreased to 3 times per week.  Please be sure medication list is accurate. If a new problem present, please set up appointment sooner than planned today.

## 2017-05-26 NOTE — Progress Notes (Signed)
HPI:   [name] is a 66 y.o. female, who is here today to establish care.  Former PCP: Dr Meda Coffee Last preventive routine visit: 2018.  Chronic medical problems: COPD, tobacco use disorder,derpession,anxiety,insomnia, HLD, and GERD among some. Atrial fib, she follows with Dr Lovena Le, s/p cardiac ablation on 05/19/17.  GERD: Hx of esophagitis and has had esophageal dilations (per pt report). Last EDG 03/2016.  She is currently on Protonix 40 mg daily.  Concerns today:   Hip and leg pain:  For many years she has had hip pain and bilateral LE pain, constant, keeps her from sleep, 9/10. Milder during the day.  She also has Hx of RLS and for years she has been on Requip 0.25 mg, she thought this was causing pain.  Lef pain is on anterior aspect, like a "toothache", not better with leg movement. She has to get up and walk, it helps.  She has not noted worsening edema or erythema.  She has intermittent LE edema, she is on Zaroxolyn 10 mg daily , according to pt, prescribed for edema. HypoK+: K+ 2.8 on  05/19/17.  + Smoker.  Hx of lower back pain, DDD, and OA. Not radiated, exacerbated by mapping or sweeping, prolonged standing, and by doing chores around her house. Alleviated by rest.  She is taking Ibuprofen, "popping pill" all night, does not help. She has not seeing ortho.  Anxiety:  She is on Xanax, which recently was decreased. She states that for years she was on Xanax 1 mg tid and decreased to 0.5 mg daily as needed. She would like dose to be increase again, she does not feel like Xanax 0.5 mg helps. She is also on Buspar 7.5 mg bid, she doe snot feel like it helps either.She has taken this med for many years. 1-2 years ago she started Lexapro 20 mg.  She denies suicidal thoughts.  She has not seen psychiatrists.  Insomnia:  Currently she is on Doxepin, which she increased from 10 mg to 20 mg a few days ago,still not helping. +Fatigue, states that  she has "always" felt tired.  Pain aggravates problem.   Review of Systems  Constitutional: Positive for fatigue. Negative for activity change, appetite change, fever and unexpected weight change.  HENT: Negative for mouth sores, nosebleeds and trouble swallowing.   Eyes: Negative for redness and visual disturbance.  Respiratory: Negative for cough, shortness of breath and wheezing.   Cardiovascular: Positive for leg swelling. Negative for chest pain and palpitations.  Gastrointestinal: Negative for abdominal pain, nausea and vomiting.       Negative for changes in bowel habits.  Endocrine: Negative for cold intolerance and heat intolerance.  Genitourinary: Negative for decreased urine volume, difficulty urinating, dysuria and hematuria.  Musculoskeletal: Positive for arthralgias and back pain. Negative for gait problem.  Skin: Negative for rash.  Neurological: Negative for syncope, weakness and headaches.  Psychiatric/Behavioral: Positive for sleep disturbance. Negative for confusion and suicidal ideas. The patient is nervous/anxious.       Current Outpatient Prescriptions on File Prior to Visit  Medication Sig Dispense Refill  . amiodarone (PACERONE) 200 MG tablet Take 1 tablet (200 mg total) by mouth daily. 30 tablet 6  . atorvastatin (LIPITOR) 80 MG tablet Take 80 mg by mouth daily.    . busPIRone (BUSPAR) 7.5 MG tablet Take 1 tablet (7.5 mg total) by mouth 3 (three) times daily. (Patient taking differently: Take 7.5 mg by mouth 2 (two) times daily. )  90 tablet 1  . Cholecalciferol (VITAMIN D) 2000 units CAPS Take 2,000 Units by mouth daily.    Marland Kitchen ELIQUIS 5 MG TABS tablet TAKE (1) TABLET BY MOUTH TWICE DAILY. 60 tablet 6  . Multiple Vitamin (MULTIVITAMIN) tablet Take 1 tablet by mouth daily.    . nitroGLYCERIN (NITROSTAT) 0.4 MG SL tablet Place 0.4 mg under the tongue every 5 (five) minutes as needed for chest pain. Reported on 06/12/2016    . rOPINIRole (REQUIP) 0.25 MG tablet TAKE  3 TABLETS AT BEDTIME AS NEEDED  FOR  RESTLESS  LEGS 90 tablet 5  . tetrahydrozoline-zinc (VISINE-AC) 0.05-0.25 % ophthalmic solution Place 2 drops into both eyes 3 (three) times daily as needed.     No current facility-administered medications on file prior to visit.      Past Medical History:  Diagnosis Date  . Anemia   . Anxiety   . Atrial flutter ([place]) 03/13/2016  . CAD (coronary artery disease)   . Cancer ([place])    skin cancer  . Cataract   . Colon polyp   . COPD (chronic obstructive pulmonary disease) ([place])   . Depression   . Emphysema of lung ([place])   . GERD (gastroesophageal reflux disease)   . Hypercholesterolemia   . Osteoporosis   . Oxygen deficiency    No Known Allergies  Family History  Problem Relation Age of Onset  . Breast cancer Sister 49  . Heart disease Sister   . Heart attack Father   . Heart disease Father   . Stroke Mother   . Heart disease Mother   . Hyperlipidemia Mother   . Heart attack Sister   . Heart disease Brother        ?heart failure  . Congestive Heart Failure Brother   . Hypertension Son   . Heart disease Maternal Grandfather   . Cancer Cousin   . Hypertension Son   . Colon cancer Neg Hx     Social History   Social History  . Marital status: Married    Spouse name: N/A  . Number of children: 3  . Years of education: 10   Occupational History  . 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 Smoking status: Current Every Day Smoker    Packs/day: 1.50    Years: 45.00    Types: Cigarettes    Start date: 06/14/1966  . Smokeless tobacco: Never Used     Comment: 1-2 packs daily  . Alcohol use No  . Drug use: No  . Sexual activity: Yes    Birth control/ protection: Post-menopausal   Other Topics Concern  . None   Social History Narrative   Quit high school and got married in 10th grade, age 44   First husband died of colon cancer at young age   Married again 2018   Lives in own home   Daughter  lives with her   Drinks coffee seldom, too much "sweet tea"    Vitals:   05/26/17 1358  BP: 120/70  Pulse: 81  Resp: 12   O2 sat at RA 90% Body mass index is 30.91 kg/m.   Physical Exam  Nursing note and vitals reviewed. Constitutional: She is oriented to person, place, and time. She appears well-developed. No distress.  HENT:  Head: Atraumatic.  Mouth/Throat: Oropharynx is clear and moist and mucous membranes are normal.  Eyes: Conjunctivae and EOM are normal. Pupils are equal, round, and  reactive to light.  Cardiovascular: Normal rate and regular rhythm.   No murmur heard. Pulses:      Dorsalis pedis pulses are 2+ on the right side, and 2+ on the left side.  Respiratory: Effort normal. No respiratory distress. She has no decreased breath sounds. She has no wheezes.  ? Fine rales right base.  GI: Soft. She exhibits no mass. There is no hepatomegaly. There is no tenderness.  Musculoskeletal: She exhibits edema (1+ bilateral pitting LE edema).       Lumbar back: She exhibits tenderness. She exhibits no bony tenderness.  Tenderness upon palpation of right lumbar paraspinal muscles. Hip flexion elicits back pain. Mild limitation of rotation. Knee crepitus bilateral.   Lymphadenopathy:    She has no cervical adenopathy.  Neurological: She is alert and oriented to person, place, and time. She has normal strength.  Stable gait with no assistance. SLR negative bilateral.  Skin: Skin is warm. No rash noted. No erythema.  Psychiatric: Her mood appears anxious. She expresses no suicidal ideation.  Well groomed, good eye contact.    ASSESSMENT AND PLAN:    Rashanna was seen today for establish care.  Diagnoses and all orders for this visit:  Chronic bilateral low back pain, with sciatica presence unspecified  According to pt, she has not seen ortho.Referrral placed. Cymbalta side effects discussed. F/U in 4 weeks.  -     DULoxetine (CYMBALTA) 30 MG capsule; Take 1 capsule  (30 mg total) by mouth daily. -     Ambulatory referral to Orthopedic Surgery  Leg pain, bilateral  Possible causes discussed:OA,radicular, PAD,or vein disease. Could be radicular. She agrees with trying Cymbalta 30 mg daily. May consider Gabapentin in the future.  Chronic anxiety  Because Cymbalta added, Lexapro dose decreased from 20 mg to 10 mg. No changes for now in Buspar. Xanax side effects discussed, I agree with continue prescribing med and max 40 tab monthly. Instructed about warning signs. F/U in 4 weeks.  -     DULoxetine (CYMBALTA) 30 MG capsule; Take 1 capsule (30 mg total) by mouth daily. -     escitalopram (LEXAPRO) 20 MG tablet; Take 0.5 tablets (10 mg total) by mouth at bedtime. -     ALPRAZolam (XANAX) 0.5 MG tablet; Take 1 tablet (0.5 mg total) by mouth 2 (two) times daily as needed for anxiety or sleep (No more than 40 tabs per month).  Chronic pain disorder  May need pain management referral, will wait until ortho evaluation is completed.  -     DULoxetine (CYMBALTA) 30 MG capsule; Take 1 capsule (30 mg total) by mouth daily.  Insomnia, unspecified type  Good sleep hygiene. Instructed to take medication as recommended, so continue 10 mg at bedtime. I may consider discontinuing medication next OV, it can interact with Amiodarone.   -     doxepin (SINEQUAN) 10 MG capsule; Take 1 capsule (10 mg total) by mouth at bedtime.  Hypokalemia  Further recommendations will be given according to lab results. Zaroxolyn dose decreased from daily to 2-3 times per week.   -     Basic metabolic panel  Reflux esophagitis  No changes in current management, will follow labs done today and will give further recommendations accordingly. F/U in 6-12 months. Avoid Ibuprofen, side effects discussed. Smoking cessation encouraged.  -     pantoprazole (PROTONIX) 40 MG tablet; Take 1 tablet (40 mg total) by mouth daily.  Bilateral lower extremity edema  Decrease  frequency of  diuretic. LE elevation and compression stoking may help.  -     metolazone (ZAROXOLYN) 10 MG tablet; Take 2 tablets (20 mg total) by mouth 3 (three) times a week.   In regard to ? Fine rales on ling auscultation, she is not c/o respiratory symptoms, no respiratory distress, and recent chest CT (04/2017 and 03/2016). Encouraged smoking cessation.    Treyvonne Tata G. Martinique, MD  [facility]. [place] office.

## 2017-05-27 ENCOUNTER — Telehealth: Payer: Self-pay | Admitting: Family Medicine

## 2017-05-27 MED ORDER — PANTOPRAZOLE SODIUM 40 MG PO TBEC: 40.0000 mg | DELAYED_RELEASE_TABLET | Freq: Every day | ORAL | 1 refills | Status: DC

## 2017-05-27 MED ORDER — DOXEPIN HCL 10 MG PO CAPS: 10.0000 mg | ORAL_CAPSULE | Freq: Every day | ORAL | 1 refills | Status: DC

## 2017-05-27 NOTE — Telephone Encounter (Signed)
I did not change dose of Doxepin, recommend continuing 1 cap at bedtime for now. Some other changes made last OV. I may be discontinuing Doxepin next OV depending of sleep, it could also interact wit Amiodarone.  It is OK to continue Protonix. Rx's were sent.  Thanks, BJ

## 2017-05-27 NOTE — Telephone Encounter (Signed)
Pt needs new rx for changed dosage of  doxepin (SINEQUAN) 10 MG capsule  2 @ bedtime  (old Rx was 1 /bedtime)  Also pantoprazole (PROTONIX) 40 MG tablet  [place], [place] [phone] (Phone) [phone] (Fax)

## 2017-06-01 ENCOUNTER — Encounter: Payer: Self-pay | Admitting: Family Medicine

## 2017-06-02 ENCOUNTER — Telehealth: Payer: Self-pay | Admitting: Family Medicine

## 2017-06-02 ENCOUNTER — Other Ambulatory Visit: Payer: Self-pay | Admitting: Family Medicine

## 2017-06-02 DIAGNOSIS — K2100 Gastro-esophageal reflux disease with esophagitis, without bleeding: Secondary | ICD-10-CM

## 2017-06-02 NOTE — Telephone Encounter (Signed)
[facility] - Client [facility]  Patient Name: Tonya Bradley  DOB: 12/13/1950    Initial Comment Caller states she was told to decrease her fluid pill. Says that the swelling and pain in her legs and feet is not getting better. Wanting to see about getting a referral to a vein specialist to discuss having her veins removed. Also wanting to know if she should take another fluid pill. Says that she takes her pill Sundays, Wednesdays, and Fridays. Wanting to know if she should take another one today.    Nurse Assessment  Nurse: Juleen China RN, Butch Penny Date/Time Eilene Ghazi Time): 06/02/2017 4:58:22 PM  Confirm and document reason for call. If symptomatic, describe symptoms. ---Caller states she was told to decrease her fluid pill. Says that the swelling and pain in her legs and feet is not getting better. Wanting to see about getting a referral to a vein specialist to discuss having her veins removed. Also wanting to know if she should take another fluid pill. Says that she takes her pill Sundays, Wednesdays, and Fridays. Wanting to know if she should take another one today. Caller has CHF. States her abdomen and legs to her toes are swollen. Can barely walk. Legs are red but not warm to touch. Denies any SOB. Denies chest pain.  Does the patient have any new or worsening symptoms? ---Yes  Will a triage be completed? ---Yes  Related visit to physician within the last 2 weeks? ---Yes  Does the PT have any chronic conditions? (i.e. diabetes, asthma, etc.) ---Yes  List chronic conditions. ---CHF Arthritis AFIB COPD  Is this a behavioral health or substance abuse call? ---No     Guidelines    Guideline Title Affirmed Question Affirmed Notes  Leg Swelling and Edema [1] MODERATE leg swelling (e.g., swelling extends up to knees) AND [2] new onset or worsening    Final Disposition User   See Physician within [place], RN, Butch Penny    Comments   Caller states her legs have looked worse and she does have puffiness in her hands. Will have an Surveyor, minerals schedule her an appointment.  Caller scheduled for 11am appt with Dr.l Martinique at the [place] location for 06/03/17.   Referrals  REFERRED TO PCP OFFICE   Disagree/Comply: Comply

## 2017-06-02 NOTE — Telephone Encounter (Signed)
Patient called on nurse line to cancel 7/10 appt with Dr Meda Coffee- states she has moved to [facility] and will be under the care of Dr Martinique there.

## 2017-06-03 ENCOUNTER — Encounter: Payer: Self-pay | Admitting: Family Medicine

## 2017-06-03 ENCOUNTER — Ambulatory Visit (INDEPENDENT_AMBULATORY_CARE_PROVIDER_SITE_OTHER): Payer: Medicare HMO | Admitting: Family Medicine

## 2017-06-03 VITALS — BP 100/70 | HR 83 | Resp 12 | Ht 62.0 in | Wt 167.2 lb

## 2017-06-03 DIAGNOSIS — I83813 Varicose veins of bilateral lower extremities with pain: Secondary | ICD-10-CM | POA: Diagnosis not present

## 2017-06-03 DIAGNOSIS — E876 Hypokalemia: Secondary | ICD-10-CM

## 2017-06-03 DIAGNOSIS — I83893 Varicose veins of bilateral lower extremities with other complications: Secondary | ICD-10-CM | POA: Insufficient documentation

## 2017-06-03 MED ORDER — FUROSEMIDE 20 MG PO TABS: 20.0000 mg | ORAL_TABLET | Freq: Two times a day (BID) | ORAL | 1 refills | Status: DC

## 2017-06-03 NOTE — Telephone Encounter (Signed)
Noted  

## 2017-06-03 NOTE — Patient Instructions (Signed)
A few things to remember from today's visit:   Varicose veins of bilateral lower extremities with pain - Plan: Ambulatory referral to Vascular Surgery, furosemide (LASIX) 20 MG tablet  Fluid pill changed to Furosemide 20 mg 2 times daily. Elevation above heart level a few times per day. Low salt diet.   Please be sure medication list is accurate. If a new problem present, please set up appointment sooner than planned today.

## 2017-06-03 NOTE — Progress Notes (Signed)
HPI:   ACUTE VISIT:  No chief complaint on file.   Tonya Bradley is a 66 y.o. female, who is here today with her husband complaining of worsening LE edema. She has Hx of bilateral LE edema, which is usually worse at the end of the day, with prolonged standing or sitting. Alleviated by elevation, better first thing in the morning but not resolved.  Last OV, 05/26/17, Zaroxolyn was decreased from 20 mg daily to 3 times per week. She has taken other diuretic meds and reporting not benefit at all, Zaroxolyn is not helping either.  Hip and LE pain, bilateral , that keeps her from sleep. Alleviated temporarily by movement, walking. Hx of RLS, pain does not improve with leg movement while in bed. Hx of lower back pain and generalized OA as well. Last OV Cymbalta added and ortho referral placed.    Other  This is a chronic problem. The problem has been gradually worsening. Associated symptoms include arthralgias, fatigue and myalgias. Pertinent negatives include no abdominal pain, chest pain, coughing, diaphoresis, fever, headaches, nausea, numbness, rash, urinary symptoms, vomiting or weakness. The symptoms are aggravated by standing. She has tried rest and position changes (Diuretics) for the symptoms. The treatment provided no relief.   States that it is "not bad" today but in general she thinks it is getting worse. She denies orthopnea,PND.  She recently followed with cardiologists, according to pt, she was told that veins "can be taken out" and this will help woth pain. She is not wearing compression stocking.  Last OV labs done, hypoK+.  Lab Results  Component Value Date   CREATININE 0.91 05/26/2017   BUN 14 05/26/2017   NA 141 05/26/2017   K 3.2 (L) 05/26/2017   CL 98 05/26/2017   CO2 37 (H) 05/26/2017  She is not on K+ supplementation.   Review of Systems  Constitutional: Positive for fatigue. Negative for diaphoresis and fever.  HENT: Negative for facial  swelling, mouth sores and trouble swallowing.   Respiratory: Negative for cough and shortness of breath.   Cardiovascular: Positive for leg swelling. Negative for chest pain.  Gastrointestinal: Negative for abdominal pain, nausea and vomiting.  Endocrine: Negative for cold intolerance and heat intolerance.  Genitourinary: Negative for decreased urine volume, dysuria and hematuria.  Musculoskeletal: Positive for arthralgias and myalgias.  Skin: Negative for rash and wound.  Neurological: Negative for weakness, numbness and headaches.      Current Outpatient Prescriptions on File Prior to Visit  Medication Sig Dispense Refill  . ALPRAZolam (XANAX) 0.5 MG tablet Take 1 tablet (0.5 mg total) by mouth 2 (two) times daily as needed for anxiety or sleep (No more than 40 tabs per month). 40 tablet 0  . amiodarone (PACERONE) 200 MG tablet Take 1 tablet (200 mg total) by mouth daily. 30 tablet 6  . atorvastatin (LIPITOR) 80 MG tablet Take 80 mg by mouth daily.    . busPIRone (BUSPAR) 7.5 MG tablet Take 1 tablet (7.5 mg total) by mouth 3 (three) times daily. (Patient taking differently: Take 7.5 mg by mouth 2 (two) times daily. ) 90 tablet 1  . Cholecalciferol (VITAMIN D) 2000 units CAPS Take 2,000 Units by mouth daily.    Marland Kitchen doxepin (SINEQUAN) 10 MG capsule Take 1 capsule (10 mg total) by mouth at bedtime. 30 capsule 1  . ELIQUIS 5 MG TABS tablet TAKE (1) TABLET BY MOUTH TWICE DAILY. 60 tablet 6  . escitalopram (LEXAPRO) 20 MG tablet Take  0.5 tablets (10 mg total) by mouth at bedtime. 30 tablet 0  . Multiple Vitamin (MULTIVITAMIN) tablet Take 1 tablet by mouth daily.    . nitroGLYCERIN (NITROSTAT) 0.4 MG SL tablet Place 0.4 mg under the tongue every 5 (five) minutes as needed for chest pain. Reported on 06/12/2016    . pantoprazole (PROTONIX) 40 MG tablet Take 1 tablet (40 mg total) by mouth daily. 90 tablet 1  . rOPINIRole (REQUIP) 0.25 MG tablet TAKE 3 TABLETS AT BEDTIME AS NEEDED  FOR  RESTLESS   LEGS 90 tablet 5  . tetrahydrozoline-zinc (VISINE-AC) 0.05-0.25 % ophthalmic solution Place 2 drops into both eyes 3 (three) times daily as needed.    . DULoxetine (CYMBALTA) 30 MG capsule Take 1 capsule (30 mg total) by mouth daily. (Patient not taking: Reported on 06/03/2017) 30 capsule 1   No current facility-administered medications on file prior to visit.      Past Medical History:  Diagnosis Date  . Anemia   . Anxiety   . Atrial flutter ([place]) 03/13/2016  . CAD (coronary artery disease)   . Cancer ([place])    skin cancer  . Cataract   . Colon polyp   . COPD (chronic obstructive pulmonary disease) ([place])   . Depression   . Emphysema of lung ([place])   . GERD (gastroesophageal reflux disease)   . Hypercholesterolemia   . Osteoporosis   . Oxygen deficiency    No Known Allergies  Social History   Social History  . Marital status: Married    Spouse name: N/A  . Number of children: 3  . Years of education: 10   Occupational History  . 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 Smoking status: Current Every Day Smoker    Packs/day: 1.50    Years: 45.00    Types: Cigarettes    Start date: 06/14/1966  . Smokeless tobacco: Never Used     Comment: 1-2 packs daily  . Alcohol use No  . Drug use: No  . Sexual activity: Yes    Birth control/ protection: Post-menopausal   Other Topics Concern  . None   Social History Narrative   Quit high school and got married in 10th grade, age 39   First husband died of colon cancer at young age   Married again 2018   Lives in own home   Daughter lives with her   Drinks coffee seldom, too much "sweet tea"    Vitals:   06/03/17 1111  BP: 100/70  Pulse: 83  Resp: 12   Body mass index is 30.59 kg/m.   Physical Exam  Nursing note and vitals reviewed. Constitutional: She is oriented to person, place, and time. She appears well-developed. No distress.  HENT:  Head: Atraumatic.  Mouth/Throat: Oropharynx  is clear and moist and mucous membranes are normal.  Eyes: Conjunctivae and EOM are normal.  Cardiovascular: Normal rate and regular rhythm.   No murmur heard. Pulses:      Dorsalis pedis pulses are 2+ on the right side, and 2+ on the left side.  Varicose veins on both LE.   Respiratory: Effort normal and breath sounds normal. No respiratory distress.  GI: Soft. She exhibits no mass. There is no hepatomegaly. There is no tenderness.  Musculoskeletal: She exhibits edema (LE 2+ pitting, bilateral.).  Neurological: She is alert and oriented to person, place, and time. She has normal strength. Gait normal.  Skin: Skin is  warm. No rash noted. No erythema.  Psychiatric: Her mood appears anxious.  Well groomed, good eye contact.      ASSESSMENT AND PLAN:   Diagnoses and all orders for this visit:  Varicose veins of bilateral lower extremities with pain  We discussed other possible etiologies of LE edema. Educated about Dx and some treatment options. Compression stocking may help, LE elevation, an low salt diet among some. Discontinue Zaroxolyn. She has tried Furosemide 10 mg in the past, I recommend trying again but a higher dose. Side effects discussed. Keep next appt.   -     Ambulatory referral to Vascular Surgery -     furosemide (LASIX) 20 MG tablet; Take 1 tablet (20 mg total) by mouth 2 (two) times daily.  Hypokalemia  Mild. Recommend K+ rich diet for now. Will repeat BMP next OV, 2-3 weeks.     -Ms.Kym Groom was advised to seek immediate medical attention is symptoms suddenly get worse, otherwise she will keep next appt and will follow with vascular.     [name]. Martinique, MD  [facility]. [place] office.

## 2017-06-04 ENCOUNTER — Encounter: Payer: Self-pay | Admitting: Family Medicine

## 2017-06-06 ENCOUNTER — Telehealth (INDEPENDENT_AMBULATORY_CARE_PROVIDER_SITE_OTHER): Payer: Self-pay | Admitting: Orthopedic Surgery

## 2017-06-06 ENCOUNTER — Ambulatory Visit (INDEPENDENT_AMBULATORY_CARE_PROVIDER_SITE_OTHER): Payer: Medicare HMO | Admitting: Family

## 2017-06-06 ENCOUNTER — Encounter (INDEPENDENT_AMBULATORY_CARE_PROVIDER_SITE_OTHER): Payer: Self-pay | Admitting: Orthopedic Surgery

## 2017-06-06 VITALS — Ht 62.0 in | Wt 167.0 lb

## 2017-06-06 DIAGNOSIS — M5416 Radiculopathy, lumbar region: Secondary | ICD-10-CM

## 2017-06-06 MED ORDER — PREDNISONE 10 MG PO TABS: ORAL_TABLET | ORAL | 0 refills | Status: DC

## 2017-06-06 NOTE — Progress Notes (Signed)
Office Visit Note   Patient: Tonya Bradley           Date of Birth: [birth year]           MRN: [id number] Visit Date: 06/06/2017              Requested by: Martinique, [name], MD [address] [place], [place] [address] PCP: Martinique, [name], MD  Chief Complaint  Patient presents with  . Lower Back - Pain      HPI: The patient is a 66 year old woman who presents today for evaluation of chronic low back pain. This is been ongoing for about 4 months. Complains of left-sided low back hip and buttock pain. Rubbing the lateral thigh this radiates down her left thigh. States she occasionally has right anterior thigh pain as well. This feels like a constant toothache. States that when she tries to sweep or mop she has to rest. Complains of pain in bed cannot get comfortable lying down. She does voice that she has relief when she leans over the kitchen sink or shopping cart.  She did have radiographs done on June 7. She has trialed a prednisone Dosepak. States it was a 3 day pack. Had minimal relief from this.  Assessment & Plan: Visit Diagnoses:  1. Radiculopathy of lumbar region     Plan: will refer to [facility] for ESI. Will likely order MRI down the road. Provided a prescription for a Pred taper. Follow up in office with [place] as needed.   Follow-Up Instructions: Return in about 4 weeks (around 07/04/2017), or if symptoms worsen or fail to improve.   Back Exam   Tenderness  The patient is experiencing tenderness in the lumbar and sacroiliac.  Tests  Straight leg raise right: negative Straight leg raise left: positive      Patient is alert, oriented, no adenopathy, well-dressed, normal affect, normal respiratory effort.   Imaging: No results found.  Labs: Lab Results  Component Value Date   LABORGA No Salmonella,Shigella,Campylobacter,Yersinia,or 06/07/2014   LABORGA No E.coli 0157:H7 isolated. 06/07/2014    Orders:  Orders Placed This Encounter  Procedures  .  Ambulatory referral to Physical Medicine Rehab   Meds ordered this encounter  Medications  . predniSONE (DELTASONE) 10 MG tablet    Sig: 6 tablets for 2 days, then 5 for 2 days, then 4 for 2 days, then 3  for 2 days, then 2 for 2 days, then 1 tablet for 2 days    Dispense:  42 tablet    Refill:  0     Procedures: No procedures performed  Clinical Data: No additional findings.  ROS:  All other systems negative, except as noted in the HPI. Review of Systems  Constitutional: Negative for chills and fever.  Musculoskeletal: Positive for back pain.  Neurological: Positive for numbness. Negative for weakness.    Objective: Vital Signs: Ht 5\' 2"  (1.575 m)   Wt 167 lb (75.8 kg)   BMI 30.54 kg/m   Specialty Comments:  No specialty comments available.  PMFS History: Patient Active Problem List   Diagnosis Date Noted  . Varicose veins of bilateral lower extremities with pain 06/03/2017  . Chronic pain disorder 05/26/2017  . Back pain 05/26/2017  . Insomnia 05/26/2017  . Osteopenia 11/26/2016  . Other emphysema ([place]) 11/07/2016  . Chronic anxiety 11/07/2016  . Nocturnal hypoxemia due to emphysema ([place]) 11/07/2016  . Constipation 06/12/2016  . Right carotid bruit 04/03/2016  . CAD (coronary artery disease)  04/03/2016  . Tobacco abuse 04/03/2016  . Atrial flutter ([place]) 03/13/2016  . Hiatal hernia   . Reflux esophagitis   . Schatzki's ring   . Dysphagia 03/01/2016  . Solitary pulmonary nodule 03/01/2016  . 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 Microscopic colitis 08/15/2014  . Loose stools 06/08/2014  . BCC (basal cell carcinoma), face 09/13/2012   Past Medical History:  Diagnosis Date  . Anemia   . Anxiety   . Atrial flutter ([place]) 03/13/2016  . CAD (coronary artery disease)   . Cancer ([place])    skin cancer  . Cataract   . Colon polyp   . COPD (chronic obstructive pulmonary disease) ([place])   . Depression   . Emphysema of lung ([place])   . GERD (gastroesophageal  reflux disease)   . Hypercholesterolemia   . Osteoporosis   . Oxygen deficiency     Family History  Problem Relation Age of Onset  . Breast cancer Sister 6  . Heart disease Sister   . Heart attack Father   . Heart disease Father   . Stroke Mother   . Heart disease Mother   . Hyperlipidemia Mother   . Heart attack Sister   . Heart disease Brother        ?heart failure  . Congestive Heart Failure Brother   . Hypertension Son   . Heart disease Maternal Grandfather   . Cancer Cousin   . Hypertension Son   . Colon cancer Neg Hx     Past Surgical History:  Procedure Laterality Date  . A-FLUTTER ABLATION N/A 05/19/2017   Procedure: A-Flutter Ablation;  Surgeon: Evans Lance, MD;  Location: [facility];  Service: Cardiovascular;  Laterality: N/A;  . APPENDECTOMY    . BASAL CELL CARCINOMA EXCISION    . CARDIAC CATHETERIZATION    . CHOLECYSTECTOMY    . COLONOSCOPY N/A 06/23/2014   Dr. Rourk:Rectal and colonic polyps-removed, Pancolonic diverticulosis. lymphocytic colitis  . CORONARY ANGIOPLASTY WITH STENT PLACEMENT    . ESOPHAGEAL DILATION N/A 03/13/2016   Procedure: ESOPHAGEAL DILATION;  Surgeon: Daneil Dolin, MD;  [facility];  Service: Endoscopy;  Laterality: N/A;  . ESOPHAGOGASTRODUODENOSCOPY N/A 03/13/2016   Procedure: ESOPHAGOGASTRODUODENOSCOPY (EGD);  Surgeon: Daneil Dolin, MD;  [facility];  Service: Endoscopy;  Laterality: N/A;  0930   Social History   Occupational History  . 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 Smoking status: Current Every Day Smoker    Packs/day: 1.50    Years: 45.00    Types: Cigarettes    Start date: 06/14/1966  . Smokeless tobacco: Never Used     Comment: 1-2 packs daily  . Alcohol use No  . Drug use: No  . Sexual activity: Yes    Birth control/ protection: Post-menopausal

## 2017-06-06 NOTE — Telephone Encounter (Signed)
Returned call to patient she advised the Rx for a Steroid pack was not called into the pharmacy yet. Patient asked for a call back when the Rx is called in. The number to contact patient is [phone]

## 2017-06-09 MED ORDER — PREDNISONE 10 MG PO TABS: ORAL_TABLET | ORAL | 0 refills | Status: DC

## 2017-06-09 NOTE — Addendum Note (Signed)
Addended by: Maxcine Ham on: 06/09/2017 11:28 AM   Modules accepted: Orders

## 2017-06-09 NOTE — Telephone Encounter (Signed)
I called and spoke with patient. RX was sent to [facility], she no longer uses, this was deleted from her records and sent to [facility] on [address].

## 2017-06-09 NOTE — Telephone Encounter (Signed)
Pt called again about this RX.

## 2017-06-10 ENCOUNTER — Ambulatory Visit: Payer: Medicare HMO | Admitting: Family Medicine

## 2017-06-10 ENCOUNTER — Telehealth: Payer: Self-pay | Admitting: Family Medicine

## 2017-06-10 DIAGNOSIS — K2100 Gastro-esophageal reflux disease with esophagitis, without bleeding: Secondary | ICD-10-CM

## 2017-06-10 DIAGNOSIS — G4700 Insomnia, unspecified: Secondary | ICD-10-CM

## 2017-06-10 MED ORDER — DOXEPIN HCL 10 MG PO CAPS: 10.0000 mg | ORAL_CAPSULE | Freq: Every day | ORAL | 1 refills | Status: DC

## 2017-06-10 MED ORDER — PANTOPRAZOLE SODIUM 40 MG PO TBEC: 40.0000 mg | DELAYED_RELEASE_TABLET | Freq: Every day | ORAL | 1 refills | Status: DC

## 2017-06-10 NOTE — Telephone Encounter (Signed)
Rxs sent

## 2017-06-10 NOTE — Telephone Encounter (Signed)
Pt med was sent to wrong pharm. Pt needs new rx doxepin 10 mg and pantoprazole send to [facility] order pharm

## 2017-06-13 ENCOUNTER — Other Ambulatory Visit: Payer: Self-pay | Admitting: Cardiology

## 2017-06-13 NOTE — Telephone Encounter (Signed)
°*  STAT* If patient is at the pharmacy, call can be transferred to refill team.   1. Which medications need to be refilled? (please list name of each medication and dose if known) amiodarone (PACERONE) 200 MG tablet [[id number]]    2. Which pharmacy/location (including street and city if local pharmacy) is medication to be sent to? [place]  3. Do they need a 30 day or 90 day supply?  90 day

## 2017-06-16 MED ORDER — AMIODARONE HCL 200 MG PO TABS: 200.0000 mg | ORAL_TABLET | Freq: Every day | ORAL | 1 refills | Status: DC

## 2017-06-16 NOTE — Telephone Encounter (Signed)
refilled to [facility] per request amiodarone

## 2017-06-23 ENCOUNTER — Ambulatory Visit (INDEPENDENT_AMBULATORY_CARE_PROVIDER_SITE_OTHER): Payer: Medicare HMO | Admitting: Vascular Surgery

## 2017-06-23 ENCOUNTER — Encounter: Payer: Self-pay | Admitting: Vascular Surgery

## 2017-06-23 VITALS — BP 117/67 | HR 72 | Temp 97.3°F | Resp 16 | Ht 62.0 in | Wt 169.0 lb

## 2017-06-23 DIAGNOSIS — I83893 Varicose veins of bilateral lower extremities with other complications: Secondary | ICD-10-CM | POA: Diagnosis not present

## 2017-06-23 NOTE — Progress Notes (Signed)
Subjective:     Patient ID: Tonya Bradley, female   DOB: [birth year], 66 y.o.   MRN: [id number]  HPI This 66 year old female was referred by Dr. Eddie Martinique for evaluation of bilateral painful varicose veins with swelling. She has had no previous treatment. She had an ablation performed by Dr. Crissie Sickles a few weeks ago. She does have a history of coronary artery disease but has had no previous treatment. She is a one pack per day smoker and does have chest discomfort and dyspnea on exertion on occasion. She does not elevate her legs on a regular basis nor wear elastic compression stockings. She has no history of DVT thrombophlebitis stasis ulcers bleeding. She develops aching discomfort in her legs at night.  Past Medical History:  Diagnosis Date  . Anemia   . Anxiety   . Atrial flutter ([place]) 03/13/2016  . CAD (coronary artery disease)   . Cancer ([place])    skin cancer  . Cataract   . Colon polyp   . COPD (chronic obstructive pulmonary disease) ([place])   . Depression   . Emphysema of lung ([place])   . GERD (gastroesophageal reflux disease)   . Hypercholesterolemia   . Osteoporosis   . Oxygen deficiency     Social History  Substance Use Topics  . Smoking status: Current Every Day Smoker    Packs/day: 1.50    Years: 45.00    Types: Cigarettes    Start date: 06/14/1966  . Smokeless tobacco: Never Used     Comment: 1-2 packs daily  . Alcohol use No    Family History  Problem Relation Age of Onset  . Breast cancer Sister 46  . Heart disease Sister   . Heart attack Father   . Heart disease Father   . Stroke Mother   . Heart disease Mother   . Hyperlipidemia Mother   . Heart attack Sister   . Heart disease Brother        ?heart failure  . Congestive Heart Failure Brother   . Hypertension Son   . Heart disease Maternal Grandfather   . Cancer Cousin   . Hypertension Son   . Colon cancer Neg Hx     No Known Allergies   Current Outpatient Prescriptions:  .  ALPRAZolam  (XANAX) 0.5 MG tablet, Take 1 tablet (0.5 mg total) by mouth 2 (two) times daily as needed for anxiety or sleep (No more than 40 tabs per month)., Disp: 40 tablet, Rfl: 0 .  amiodarone (PACERONE) 200 MG tablet, Take 1 tablet (200 mg total) by mouth daily., Disp: 90 tablet, Rfl: 1 .  atorvastatin (LIPITOR) 80 MG tablet, Take 80 mg by mouth daily., Disp: , Rfl:  .  busPIRone (BUSPAR) 7.5 MG tablet, Take 1 tablet (7.5 mg total) by mouth 3 (three) times daily. (Patient taking differently: Take 7.5 mg by mouth 2 (two) times daily. ), Disp: 90 tablet, Rfl: 1 .  Cholecalciferol (VITAMIN D) 2000 units CAPS, Take 2,000 Units by mouth daily., Disp: , Rfl:  .  doxepin (SINEQUAN) 10 MG capsule, Take 1 capsule (10 mg total) by mouth at bedtime., Disp: 30 capsule, Rfl: 1 .  DULoxetine (CYMBALTA) 30 MG capsule, Take 1 capsule (30 mg total) by mouth daily., Disp: 30 capsule, Rfl: 1 .  ELIQUIS 5 MG TABS tablet, TAKE (1) TABLET BY MOUTH TWICE DAILY., Disp: 60 tablet, Rfl: 6 .  escitalopram (LEXAPRO) 20 MG tablet, Take 0.5 tablets (10 mg total) by mouth at bedtime.,  Disp: 30 tablet, Rfl: 0 .  furosemide (LASIX) 20 MG tablet, Take 1 tablet (20 mg total) by mouth 2 (two) times daily., Disp: 60 tablet, Rfl: 1 .  metolazone (ZAROXOLYN) 10 MG tablet, , Disp: , Rfl: 0 .  Multiple Vitamin (MULTIVITAMIN) tablet, Take 1 tablet by mouth daily., Disp: , Rfl:  .  nitroGLYCERIN (NITROSTAT) 0.4 MG SL tablet, Place 0.4 mg under the tongue every 5 (five) minutes as needed for chest pain. Reported on 06/12/2016, Disp: , Rfl:  .  pantoprazole (PROTONIX) 40 MG tablet, Take 1 tablet (40 mg total) by mouth daily., Disp: 90 tablet, Rfl: 1 .  rOPINIRole (REQUIP) 0.25 MG tablet, TAKE 3 TABLETS AT BEDTIME AS NEEDED  FOR  RESTLESS  LEGS, Disp: 90 tablet, Rfl: 5 .  tetrahydrozoline-zinc (VISINE-AC) 0.05-0.25 % ophthalmic solution, Place 2 drops into both eyes 3 (three) times daily as needed., Disp: , Rfl:  .  predniSONE (DELTASONE) 10 MG tablet,  6 tablets for 2 days, then 5 for 2 days, then 4 for 2 days, then 3  for 2 days, then 2 for 2 days, then 1 tablet for 2 days (Patient not taking: Reported on 06/23/2017), Disp: 42 tablet, Rfl: 0  Vitals:   06/23/17 1532  BP: 117/67  Pulse: 72  Resp: 16  Temp: (!) 97.3 F (36.3 C)  SpO2: 94%  Weight: 169 lb (76.7 kg)  Height: 5\' 2"  (1.575 m)    Body mass index is 30.91 kg/m.         Review of Systems See history of present illness. Does have history of back pain. Also has recently been placed on a diuretic by her medical doctor. Long history of tobacco abuse as noted.    Objective:   Physical Exam BP 117/67 (BP Location: Left Arm, Patient Position: Sitting, Cuff Size: Large)   Pulse 72   Temp (!) 97.3 F (36.3 C)   Resp 16   Ht 5\' 2"  (1.575 m)   Wt 169 lb (76.7 kg)   SpO2 94%   BMI 30.91 kg/m     Gen.-alert and oriented x3 in no apparent distress HEENT normal for age Lungs wheezing-diffuse rhonchi Cardiovascular regular rhythm no murmurs carotid pulses 3+ palpable no bruits audible Abdomen soft nontender no palpable masses-obese Musculoskeletal free of  major deformities Skin clear -no rashes Neurologic normal Lower extremities 3+ femoral and posterior tibial pulses palpable bilaterally with 1+ edema bilaterally No bulging varicose veins noted. A few spider veins noted medially and laterally and the thighs. No hyperpigmentation or ulceration noted.  Today performed a bedside SonoSite ultrasound exam. Both great saphenous veins are relatively normal in size with no gross reflux noted        Assessment:     #1 bilateral lower extremity edema-chronic-oligemia 9 #2 nocturnal leg cramps #3 chronic back pain #4 chronic tobacco abuse #5 history of arrhythmia treated by recent ablation by Dr. Crissie Sickles   #6 coronary artery disease    Plan:     #1 elevate foot of bed 2-3 inches #2 short leg elastic compression stockings 20-30 millimeter gradient to be placed  on the legs first thing in the morning #3 there is no vascular intervention which will improve patient's lower extremity edema #4 continue diuretics #5 would not order formal venous reflux exam at this time  it appears very unlikely that any intervention will improve her situation. If situation worsens significantly she should have bilateral lower extremity venous reflux study ordered

## 2017-06-24 ENCOUNTER — Encounter (INDEPENDENT_AMBULATORY_CARE_PROVIDER_SITE_OTHER): Payer: Self-pay | Admitting: Physical Medicine and Rehabilitation

## 2017-06-24 ENCOUNTER — Ambulatory Visit (INDEPENDENT_AMBULATORY_CARE_PROVIDER_SITE_OTHER): Payer: Medicare HMO | Admitting: Physical Medicine and Rehabilitation

## 2017-06-24 ENCOUNTER — Encounter: Payer: Self-pay | Admitting: Internal Medicine

## 2017-06-24 ENCOUNTER — Ambulatory Visit (INDEPENDENT_AMBULATORY_CARE_PROVIDER_SITE_OTHER): Payer: Medicare HMO | Admitting: Internal Medicine

## 2017-06-24 ENCOUNTER — Telehealth (INDEPENDENT_AMBULATORY_CARE_PROVIDER_SITE_OTHER): Payer: Self-pay

## 2017-06-24 VITALS — BP 120/72 | HR 75 | Ht 62.0 in | Wt 170.2 lb

## 2017-06-24 VITALS — BP 122/67 | HR 66

## 2017-06-24 DIAGNOSIS — M5416 Radiculopathy, lumbar region: Secondary | ICD-10-CM | POA: Diagnosis not present

## 2017-06-24 DIAGNOSIS — G8929 Other chronic pain: Secondary | ICD-10-CM | POA: Diagnosis not present

## 2017-06-24 DIAGNOSIS — M5442 Lumbago with sciatica, left side: Secondary | ICD-10-CM

## 2017-06-24 DIAGNOSIS — I4892 Unspecified atrial flutter: Secondary | ICD-10-CM

## 2017-06-24 DIAGNOSIS — I483 Typical atrial flutter: Secondary | ICD-10-CM | POA: Diagnosis not present

## 2017-06-24 DIAGNOSIS — Z720 Tobacco use: Secondary | ICD-10-CM

## 2017-06-24 DIAGNOSIS — M47816 Spondylosis without myelopathy or radiculopathy, lumbar region: Secondary | ICD-10-CM

## 2017-06-24 MED ORDER — DIAZEPAM 5 MG PO TABS: ORAL_TABLET | ORAL | 0 refills | Status: DC

## 2017-06-24 MED ORDER — AMIODARONE HCL 200 MG PO TABS: ORAL_TABLET | ORAL | 3 refills | Status: DC

## 2017-06-24 NOTE — Progress Notes (Signed)
HPI Tonya Bradley returns today for followup. She is a pleasant 66 yo woman with atrial flutter and HTN. She has copd and tobacco abuse which is ongoing. The patient has undergone EP study and catheter ablation of atrial flutter and has done well. No additional palpitations.  No Known Allergies   Current Outpatient Prescriptions  Medication Sig Dispense Refill  . ALPRAZolam (XANAX) 0.5 MG tablet Take 1 tablet (0.5 mg total) by mouth 2 (two) times daily as needed for anxiety or sleep (No more than 40 tabs per month). 40 tablet 0  . amiodarone (PACERONE) 200 MG tablet Take 1 tablet (200 mg total) by mouth daily. 90 tablet 1  . atorvastatin (LIPITOR) 80 MG tablet Take 80 mg by mouth daily.    . busPIRone (BUSPAR) 7.5 MG tablet Take 7.5 mg by mouth 2 (two) times daily.    . Cholecalciferol (VITAMIN D) 2000 units CAPS Take 2,000 Units by mouth daily.    Marland Kitchen doxepin (SINEQUAN) 10 MG capsule Take 1 capsule (10 mg total) by mouth at bedtime. 30 capsule 1  . DULoxetine (CYMBALTA) 30 MG capsule Take 1 capsule (30 mg total) by mouth daily. 30 capsule 1  . ELIQUIS 5 MG TABS tablet TAKE (1) TABLET BY MOUTH TWICE DAILY. 60 tablet 6  . escitalopram (LEXAPRO) 20 MG tablet Take 0.5 tablets (10 mg total) by mouth at bedtime. 30 tablet 0  . furosemide (LASIX) 20 MG tablet Take 1 tablet (20 mg total) by mouth 2 (two) times daily. 60 tablet 1  . Multiple Vitamin (MULTIVITAMIN) tablet Take 1 tablet by mouth daily.    . nitroGLYCERIN (NITROSTAT) 0.4 MG SL tablet Place 0.4 mg under the tongue every 5 (five) minutes as needed for chest pain. Reported on 06/12/2016    . pantoprazole (PROTONIX) 40 MG tablet Take 1 tablet (40 mg total) by mouth daily. 90 tablet 1  . rOPINIRole (REQUIP) 0.25 MG tablet Take 0.75 mg by mouth at bedtime.    Marland Kitchen tetrahydrozoline-zinc (VISINE-AC) 0.05-0.25 % ophthalmic solution Place 2 drops into both eyes 3 (three) times daily as needed (dry eyes).      No current facility-administered  medications for this visit.      Past Medical History:  Diagnosis Date  . Anemia   . Anxiety   . Atrial flutter ([place]) 03/13/2016  . CAD (coronary artery disease)   . Cancer ([place])    skin cancer  . Cataract   . Colon polyp   . COPD (chronic obstructive pulmonary disease) ([place])   . Depression   . Emphysema of lung ([place])   . GERD (gastroesophageal reflux disease)   . Hypercholesterolemia   . Osteoporosis   . Oxygen deficiency     ROS:   All systems reviewed and negative except as noted in the HPI.   Past Surgical History:  Procedure Laterality Date  . A-FLUTTER ABLATION N/A 05/19/2017   Procedure: A-Flutter Ablation;  Surgeon: Evans Lance, MD;  Location: [facility];  Service: Cardiovascular;  Laterality: N/A;  . APPENDECTOMY    . BASAL CELL CARCINOMA EXCISION    . CARDIAC CATHETERIZATION    . CHOLECYSTECTOMY    . COLONOSCOPY N/A 06/23/2014   Dr. Rourk:Rectal and colonic polyps-removed, Pancolonic diverticulosis. lymphocytic colitis  . CORONARY ANGIOPLASTY WITH STENT PLACEMENT    . ESOPHAGEAL DILATION N/A 03/13/2016   Procedure: ESOPHAGEAL DILATION;  Surgeon: Daneil Dolin, MD;  [facility];  Service: Endoscopy;  Laterality: N/A;  .  ESOPHAGOGASTRODUODENOSCOPY N/A 03/13/2016   Procedure: ESOPHAGOGASTRODUODENOSCOPY (EGD);  Surgeon: Daneil Dolin, MD;  [facility];  Service: Endoscopy;  Laterality: N/A;  0930     Family History  Problem Relation Age of Onset  . Breast cancer Sister 69  . Heart disease Sister   . Heart attack Father   . Heart disease Father   . Stroke Mother   . Heart disease Mother   . Hyperlipidemia Mother   . Heart attack Sister   . Heart disease Brother        ?heart failure  . Congestive Heart Failure Brother   . Hypertension Son   . Heart disease Maternal Grandfather   . Cancer Cousin   . Hypertension Son   . Colon cancer Neg Hx      Social History   Social History  . Marital status: Married     Spouse name: N/A  . Number of children: 3  . Years of education: 10   Occupational History  . 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 Smoking status: Current Every Day Smoker    Packs/day: 1.50    Years: 45.00    Types: Cigarettes    Start date: 06/14/1966  . Smokeless tobacco: Never Used     Comment: 1-2 packs daily  . Alcohol use No  . Drug use: No  . Sexual activity: Yes    Birth control/ protection: Post-menopausal   Other Topics Concern  . Not on file   Social History Narrative   Quit high school and got married in 10th grade, age 58   First husband died of colon cancer at young age   Married again 2018   Lives in own home   Daughter lives with her   Drinks coffee seldom, too much "sweet tea"     BP 120/72   Pulse 75   Ht 5\' 2"  (1.575 m)   Wt 170 lb 3.2 oz (77.2 kg)   BMI 31.13 kg/m   Physical Exam:  Well appearing 66 yo woman, NAD HEENT: Unremarkable Neck:  7 cm JVD, no thyromegally Lymphatics:  No adenopathy Back:  No CVA tenderness Lungs:  Clear with scattered basilar rales.  HEART:  Regular rate rhythm, no murmurs, no rubs, no clicks Abd:  soft, positive bowel sounds, no organomegally, no rebound, no guarding Ext:  2 plus pulses, no edema, no cyanosis, no clubbing Skin:  No rashes no nodules Neuro:  CN II through XII intact, motor grossly intact  EKG - nsr  Assess/Plan: 1. Atrial flutter - she is s/p EP study and catheter ablation. She will stop her Eliquis today. I will decrease her dose of amiodarone to 100 mg daily and she will continue this until I see her back in 6 months. 2.  CAD - she denies anginal symptoms. She will continue her current meds. 3. Dyslipidemia - she will continue her high dose statin therapy.  4. Tobacco abuse - she is encouraged to stop smoking.

## 2017-06-24 NOTE — Progress Notes (Deleted)
Left side low back and buttock pain for 4 months. Constant pain and some days worse than others. Difficulty walking long periods and doing house work. Gets relief with sitting.  Radiates down leg to knee at times.

## 2017-06-24 NOTE — Telephone Encounter (Signed)
Needs precert for bilateral D6-4 facet. Scheduled for 07/07/17.

## 2017-06-24 NOTE — Patient Instructions (Addendum)
Medication Instructions:  Your physician has recommended you make the following change in your medication: decrease your Amiodarone to 1/2 tablet (100 mg) by mouth daily.   Stop taking your Eliquis.   Labwork: None ordered.   Testing/Procedures: None ordered.   Follow-Up: Your physician wants you to follow-up in: 6 months with Dr. Lovena Le.  You will receive a reminder letter in the mail two months in advance. If you don't receive a letter, please call our office to schedule the follow-up appointment.  Any Other Special Instructions Will Be Listed Below (If Applicable).  Reduce salt intake.   If you need a refill on your cardiac medications before your next appointment, please call your pharmacy.

## 2017-06-25 ENCOUNTER — Encounter (INDEPENDENT_AMBULATORY_CARE_PROVIDER_SITE_OTHER): Payer: Self-pay | Admitting: Physical Medicine and Rehabilitation

## 2017-06-25 NOTE — Progress Notes (Signed)
Tonya Bradley - 66 y.o. female MRN [id number]  Date of birth: 07-07-1951  Office Visit Note: Visit Date: 06/24/2017 PCP: Martinique, [name], MD Referred by: Martinique, [name], MD  Subjective: Chief Complaint  Patient presents with  . Lower Back - Pain   HPI: Tonya Bradley is a pleasant 66 year old female who comes in today at the request of Dondra Prader, FN-P in our office for evaluation and potential interventional procedure for the patient's chronic worsening left low back and hip pain. By history she has been followed for this problem this past year initially by a Dr. Meda Coffee at a family practice [place]. Her care was then transferred to Dr. Betty Martinique at [facility] [place].  Patient reports chronic worsening left-sided low back and buttock pain that will refer to the posterior lateral aspect of the hip. She reports this is been ongoing for 4 months and worsening. She reports constant pain without relief and some days are little bit worse than others. She has difficulty walking for long periods and difficulty standing for housework. She states she'll have to find a place to sit down and this does relieve her symptoms. She does get bilateral file referral type claudication pain when she walks. This is more of an L3 and L4 distribution. It will radiate and refer down to the knee at times. Her biggest complaint is her back pain and hip pain in the posterior region. She denies any groin pain. She's been evaluated by Dr. Sharol Given and Antony Madura and this was not felt to be hip related issue. She did have x-rays of the lumbar spine completed these are reviewed below. She has multilevel facet arthropathy as well as osteoporosis and disc space narrowing at L3-4 without significant spurring. She reports no prior spinal surgery or spinal injections. She's had off and on back pain in the past with no specific injury. She does not note that she is really been managed for osteoporosis. Reviewing the notes by Dr.  Martinique who has taken over her care recently she has been started on Cymbalta which I think is a very good medication for this patient. It may help with her pain but probably not going to help right off that she is not experiencing any pain relief with that yet. I did discuss this medicine at length with her. She does suffer from significant anxiety issues and I think that is also exacerbating the underlying problem. She has had prednisone taper and that helped for the first 2 or 3 days and then the pain came right back. She was taking some anti-inflammatory but this has been stopped. She has not had lumbar spine MRI. She is getting some foot pain as well as times but she thinks this may be from the swelling in her legs.    Review of Systems  Constitutional: Negative for chills, fever, malaise/fatigue and weight loss.  HENT: Negative for hearing loss and sinus pain.   Eyes: Negative for blurred vision, double vision and photophobia.  Respiratory: Negative for cough and shortness of breath.   Cardiovascular: Negative for chest pain, palpitations and leg swelling.  Gastrointestinal: Negative for abdominal pain, nausea and vomiting.  Genitourinary: Negative for flank pain.  Musculoskeletal: Positive for back pain. Negative for myalgias.       Left posterior hip pain and bilateral thigh pain  Skin: Negative for itching and rash.  Neurological: Negative for tremors, focal weakness and weakness.  Endo/Heme/Allergies: Negative.   Psychiatric/Behavioral: Negative for depression. The patient  is nervous/anxious.   All other systems reviewed and are negative.  Otherwise per HPI.  Assessment & Plan: Visit Diagnoses:  1. Lumbar radiculopathy   2. Chronic left-sided low back pain with left-sided sciatica   3. Spondylosis without myelopathy or radiculopathy, lumbar region     Plan: Findings:  Chronic four-month history of left posterior lateral hip pain as well as low back pain worse with standing. Her  symptoms seem to be fairly consistent with facet mediated low back pain particularly at the L4-5 region. Her pain could also be related to lumbar stenosis at L3-4 but without an MRI we don't know for sure if this exists. Is not her main complaint that these are probably related because of the arthritis causing multifactorial stenosis. I think the best approach at this point since she is in pain has been going on for quite some time as a diagnostic facet joint block at L4-5 and maybe L5-S1. If she gets a lot of relief with this that it would be diagnostic and we can just watch her. She might be a candidate for radiofrequency ablation which would require diagnostic medial branch blocks and working through physical therapy. If she doesn't get much relief or if it's very short-lived we would decide whether to complete lumbar spine MRI versus one-time epidural injection. Her case is complicated by significant anxiety issues. We will give her a prescription for one-time Valium preprocedure. She does have some myofascial component and focused physical therapy would be something to regroup with depending on the diagnosis.   She also has bone demineralization on the lumbar spine x-ray. Usually when this shows up on x-ray it is pretty significant. I've asked her to follow-up with her primary care physician concerning the osteoporosis evaluation and management.  Lastly we did discuss smoking cessation which I think has been shown pretty conclusively to cause increased pain and back pain really independent of imaging and other findings.   I spent more than 25 minutes speaking face-to-face with the patient with 50% of the time in counseling.    Meds & Orders:  Meds ordered this encounter  Medications  . DISCONTD: diazepam (VALIUM) 5 MG tablet    Sig: Take 1 by mouth 1 to 2 hours pre-procedure. May repeat if necessary.    Dispense:  2 tablet    Refill:  0    Orders Placed This Encounter  Procedures  .  Ambulatory referral to Physical Medicine Rehab    Follow-up: Return if symptoms worsen or fail to improve, for Bilateral L4-5 facet block.   Procedures: No procedures performed  No notes on file   Clinical History: LUMBAR SPINE - COMPLETE 4+ VIEW  COMPARISON:  CT abdomen and pelvis 08/18/2014  FINDINGS: Osseous demineralization.  Five non-rib bearing lumbar vertebral bodies.  Facet degenerative changes lower lumbar spine.  Mild disk space narrowing at L3-L4 with minimal endplate spur formation, progressive since 2015.  Vertebral body heights maintained without fracture or subluxation.  No bone destruction or spondylolysis.  Atherosclerotic calcifications aorta and iliac arteries.  IMPRESSION: Degenerative disc and facet disease changes of the lumbar spine with osseous demineralization.  No acute abnormalities.   Electronically Signed   By: Lavonia Dana M.D.   On: 05/08/2017 16:36  She reports that she has been smoking Cigarettes.  She started smoking about 51 years ago. She has a 67.50 pack-year smoking history. She has never used smokeless tobacco. No results for input(s): HGBA1C, LABURIC in the last 8760 hours.  Objective:  VS:  HT:    WT:   BMI:     BP:122/67  HR:66bpm  TEMP: ( )  RESP:93 % Physical Exam  Constitutional: She is oriented to person, place, and time. She appears well-developed and well-nourished. No distress.  HENT:  Head: Normocephalic and atraumatic.  Nose: Nose normal.  Mouth/Throat: Oropharynx is clear and moist.  Eyes: Pupils are equal, round, and reactive to light. Conjunctivae are normal.  Neck: Normal range of motion. Neck supple.  Cardiovascular: Regular rhythm and intact distal pulses.   Pulmonary/Chest: Effort normal. No respiratory distress.  Abdominal: She exhibits no distension. There is no guarding.  Musculoskeletal: She exhibits edema.  Patient is very slow to rise from a seated position. She has tenderness in the  paraspinal region with focal trigger point in the quadratus lumborum and reproduces some of her pain. She has pain with extension rotation of the lumbar spine that does reproduce and is concordant for her low back pain. She has mild tenderness over the greater trochanters but nothing exquisite. She has no pain with hip rotation. Checks he has an equivocally positive slump test to the left. She has no clonus bilaterally and good distal strength.  Neurological: She is alert and oriented to person, place, and time. She exhibits normal muscle tone. Coordination normal.  Skin: Skin is warm. No rash noted. No erythema.  Psychiatric: She has a normal mood and affect. Her behavior is normal.  Nursing note and vitals reviewed.   Ortho Exam Imaging: No results found.  Past Medical/Family/Surgical/Social History: Medications & Allergies reviewed per EMR Patient Active Problem List   Diagnosis Date Noted  . Varicose veins of bilateral lower extremities with other complications 96/03/5408  . Chronic pain disorder 05/26/2017  . Back pain 05/26/2017  . Insomnia 05/26/2017  . Osteopenia 11/26/2016  . Other emphysema ([place]) 11/07/2016  . Chronic anxiety 11/07/2016  . 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 Constipation 06/12/2016  . Right carotid bruit 04/03/2016  . CAD (coronary artery disease) 04/03/2016  . Tobacco abuse 04/03/2016  . Atrial flutter ([place]) 03/13/2016  . Hiatal hernia   . Reflux esophagitis   . Schatzki's ring   . Dysphagia 03/01/2016  . Solitary pulmonary nodule 03/01/2016  . 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 Microscopic colitis 08/15/2014  . Loose stools 06/08/2014  . BCC (basal cell carcinoma), face 09/13/2012   Past Medical History:  Diagnosis Date  . Anemia   . Anxiety   . Atrial flutter ([place]) 03/13/2016  . CAD (coronary artery disease)   . Cancer ([place])    skin cancer  . Cataract   . Colon polyp   . COPD (chronic obstructive pulmonary  disease) ([place])   . Depression   . Emphysema of lung ([place])   . GERD (gastroesophageal reflux disease)   . Hypercholesterolemia   . Osteoporosis   . Oxygen deficiency    Family History  Problem Relation Age of Onset  . Breast cancer Sister 32  . Heart disease Sister   . Heart attack Father   . Heart disease Father   . Stroke Mother   . Heart disease Mother   . Hyperlipidemia Mother   . Heart attack Sister   . Heart disease Brother        ?heart failure  . Congestive Heart Failure Brother   . Hypertension Son   . Heart disease Maternal Grandfather   . Cancer Cousin   . Hypertension Son   . Colon cancer Neg  Hx    Past Surgical History:  Procedure Laterality Date  . A-FLUTTER ABLATION N/A 05/19/2017   Procedure: A-Flutter Ablation;  Surgeon: Evans Lance, MD;  Location: [facility];  Service: Cardiovascular;  Laterality: N/A;  . APPENDECTOMY    . BASAL CELL CARCINOMA EXCISION    . CARDIAC CATHETERIZATION    . CHOLECYSTECTOMY    . COLONOSCOPY N/A 06/23/2014   Dr. Rourk:Rectal and colonic polyps-removed, Pancolonic diverticulosis. lymphocytic colitis  . CORONARY ANGIOPLASTY WITH STENT PLACEMENT    . ESOPHAGEAL DILATION N/A 03/13/2016   Procedure: ESOPHAGEAL DILATION;  Surgeon: Daneil Dolin, MD;  Location: [facility];  Service: Endoscopy;  Laterality: N/A;  . ESOPHAGOGASTRODUODENOSCOPY N/A 03/13/2016   Procedure: ESOPHAGOGASTRODUODENOSCOPY (EGD);  Surgeon: Daneil Dolin, MD;  Location: [facility];  Service: Endoscopy;  Laterality: N/A;  0930   Social History   Occupational History  . 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 Smoking status: Current Every Day Smoker    Packs/day: 1.50    Years: 45.00    Types: Cigarettes    Start date: 06/14/1966  . Smokeless tobacco: Never Used     Comment: 1-2 packs daily  . Alcohol use No  . Drug use: No  . Sexual activity: Yes    Birth control/ protection: Post-menopausal

## 2017-06-26 NOTE — Telephone Encounter (Signed)
Faxed auth form with last 2 office notes to [place]. See referral.

## 2017-06-29 NOTE — Progress Notes (Signed)
HPI:   Tonya Bradley is a 66 y.o. female, who is here today with her husband to follow on recent [place], 05/26/17.   She was seen on 06/03/17 for acute visit, LE edema, she was referred to vascular.She saw Dr Kellie Simmering. She is on Furosemide 40 mg daily, which she had left from prior Rx. Last OV Furosemide 20 mg bid was recommended. She is not wearing compression stocking as recommended. She is sleeping with LE elevation as recommended by Dr Kellie Simmering. LE edema has improved some. Still smoking, has not tried to quit.  HypoK+: Last OV, 06/03/17, Zaroxolyn was discontinued.   Lab Results  Component Value Date   CREATININE 0.91 05/26/2017   BUN 14 05/26/2017   NA 141 05/26/2017   K 3.2 (L) 05/26/2017   CL 98 05/26/2017   CO2 37 (H) 05/26/2017    Since her last OV she has also seen cardiologists, Hx of atrial fib; and Dr Ernestina Patches for lumbar radiculopathy. Eliquis was discontinued and Amiodarone decreased from 200 mg to 100 mg during her last visit with cardiologists.  On 05/26/17 Cymbalta 30 mg was started for chronic lower back pain with radicular pain. Severe pain, worse at night, denies saddle anesthesia,urine/bowel continence changes.  Pending epidural injection on 07/07/17, per pt report. She has tolerated Cymbalta well. She has not noted changes in anxiety,mood,or pain level.  Also she is on Doxepin 10-20 mg for insomnia. Medication is not helping. She is sleeping about 4-5 interrupted hours.  Anxiety disorder, she is on Buspar 7.5 mg bid, Lexapro 10 mg daily (decreased from 20 mg last OV),Cymbalta 30 mg daily,and Xanax 0.5 mg bid as needed.  She denies suicidal thoughts.  Buspar has not helped with anxiety. She is requesting refills for Xanax.    Review of Systems  Constitutional: Positive for fatigue. Negative for activity change, appetite change and fever.  HENT: Negative for mouth sores, nosebleeds and trouble swallowing.   Eyes: Negative for redness and visual  disturbance.  Respiratory: Negative for cough, shortness of breath and wheezing.   Cardiovascular: Positive for leg swelling. Negative for chest pain and palpitations.  Gastrointestinal: Negative for abdominal pain, nausea and vomiting.       Negative for changes in bowel habits.  Genitourinary: Negative for decreased urine volume and hematuria.  Musculoskeletal: Positive for back pain. Negative for gait problem.  Skin: Negative for rash and wound.  Neurological: Negative for syncope, weakness and headaches.  Psychiatric/Behavioral: Positive for sleep disturbance. Negative for confusion and suicidal ideas. The patient is nervous/anxious.       Current Outpatient Prescriptions on File Prior to Visit  Medication Sig Dispense Refill  . amiodarone (PACERONE) 200 MG tablet Take 1/2 tablet (100 mg) by mouth once daily 90 tablet 3  . atorvastatin (LIPITOR) 80 MG tablet Take 80 mg by mouth daily.    . Cholecalciferol (VITAMIN D) 2000 units CAPS Take 2,000 Units by mouth daily.    . furosemide (LASIX) 20 MG tablet Take 1 tablet (20 mg total) by mouth 2 (two) times daily. 60 tablet 1  . Multiple Vitamin (MULTIVITAMIN) tablet Take 1 tablet by mouth daily.    . nitroGLYCERIN (NITROSTAT) 0.4 MG SL tablet Place 0.4 mg under the tongue every 5 (five) minutes as needed for chest pain. Reported on 06/12/2016    . pantoprazole (PROTONIX) 40 MG tablet Take 1 tablet (40 mg total) by mouth daily. 90 tablet 1  . rOPINIRole (REQUIP) 0.25 MG tablet Take 0.75  mg by mouth at bedtime.    Marland Kitchen tetrahydrozoline-zinc (VISINE-AC) 0.05-0.25 % ophthalmic solution Place 2 drops into both eyes 3 (three) times daily as needed (dry eyes).      No current facility-administered medications on file prior to visit.      Past Medical History:  Diagnosis Date  . Anemia   . Anxiety   . Atrial flutter ([place]) 03/13/2016  . CAD (coronary artery disease)   . Cancer ([place])    skin cancer  . Cataract   . Colon polyp   . COPD  (chronic obstructive pulmonary disease) ([place])   . Depression   . Emphysema of lung ([place])   . GERD (gastroesophageal reflux disease)   . Hypercholesterolemia   . Osteoporosis   . Oxygen deficiency    No Known Allergies  Social History   Social History  . Marital status: Married    Spouse name: N/A  . Number of children: 3  . Years of education: 10   Occupational History  . 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 Smoking status: Current Every Day Smoker    Packs/day: 1.50    Years: 45.00    Types: Cigarettes    Start date: 06/14/1966  . Smokeless tobacco: Never Used     Comment: 1-2 packs daily  . Alcohol use No  . Drug use: No  . Sexual activity: Yes    Birth control/ protection: Post-menopausal   Other Topics Concern  . None   Social History Narrative   Quit high school and got married in 10th grade, age 27   First husband died of colon cancer at young age   Married again 2018   Lives in own home   Daughter lives with her   Drinks coffee seldom, too much "sweet tea"    Vitals:   06/30/17 1345  BP: 118/76  Pulse: 82  Resp: 12   Body mass index is 30.64 kg/m.   Physical Exam  Nursing note and vitals reviewed. Constitutional: She is oriented to person, place, and time. She appears well-developed. No distress.  HENT:  Head: Normocephalic and atraumatic.  Mouth/Throat: Oropharynx is clear and moist and mucous membranes are normal.  Eyes: Pupils are equal, round, and reactive to light. Conjunctivae are normal.  Cardiovascular: Normal rate and regular rhythm.   No murmur heard. Pulses:      Dorsalis pedis pulses are 2+ on the right side, and 2+ on the left side.  Varicose veins LE,bilateral.  Respiratory: Effort normal and breath sounds normal. No respiratory distress.  GI: Soft. She exhibits no mass. There is no hepatomegaly. There is no tenderness.  Musculoskeletal: She exhibits edema (2+ pitting LE edema, bilateral.).        Lumbar back: She exhibits tenderness (Left paraspinal muscles, lumbar). She exhibits no bony tenderness.  Neurological: She is alert and oriented to person, place, and time. She has normal strength.  Stable gait with not assistance.  Skin: Skin is warm. No rash noted. No erythema.  Psychiatric: Her mood appears anxious.  Well groomed, good eye contact.    ASSESSMENT AND PLAN:   Ms. Samreet was seen today for follow-up.  Diagnoses and all orders for this visit:  Varicose veins of bilateral lower extremities with other complications  Improved some. Skin care of LE's discussed. Compression stocking strongly recommended. She did fill Rx for Furosemide 20 mg, so continue 2 tabs daily,some side effects discussed.  Strongly  recommend smoking cessation but she is not interested. F/U in 3-4 months.  Insomnia, unspecified type  Not well controlled. Doxepin not helping, so discontinued. Good sleep hygiene recommended. Trazodone may help, instructed to start with 25 mg and titrate up to 150 mg as tolerated. Side effects of medication discussed as well as risks of interaction with some of her medications: Amiodarone, Cymbalta. F/U in 3 months.  -     traZODone (DESYREL) 50 MG tablet; Take 1 tablet (50 mg total) by mouth at bedtime as needed for sleep.  Chronic anxiety  Xanax 0.5 mg bid as needed, no more than 40 tabs per month, post dated Rx given. Discontinued Buspar and Lexapro. Increase Cymbalta from 30 mg to 60 mg. Instructed about warning signs, if any withdrawal like symptoms, Lexapro can be resumed at 5 mg daily. F/U in 3 months, before if needed.  -     DULoxetine (CYMBALTA) 60 MG capsule; Take 1 capsule (60 mg total) by mouth daily. -     ALPRAZolam (XANAX) 0.5 MG tablet; Take 1 tablet (0.5 mg total) by mouth 2 (two) times daily as needed for anxiety or sleep (No more than 40 tabs per month).  Chronic bilateral low back pain with sciatica, sciatica laterality  unspecified  Planning on epidural injection. Cymbalta increased from 30 mg to 60 mg. Continue following with Dr Lucia Gaskins.  -     DULoxetine (CYMBALTA) 60 MG capsule; Take 1 capsule (60 mg total) by mouth daily.  Hypokalemia  Further recommendations will be given according to lab results. K+ rich diet to continue for now.  -     Potassium      Herny Scurlock G. Martinique, MD  [facility]. [place] office.

## 2017-06-30 ENCOUNTER — Ambulatory Visit (INDEPENDENT_AMBULATORY_CARE_PROVIDER_SITE_OTHER): Payer: Medicare HMO | Admitting: Family Medicine

## 2017-06-30 ENCOUNTER — Encounter: Payer: Self-pay | Admitting: Family Medicine

## 2017-06-30 VITALS — BP 118/76 | HR 82 | Resp 12 | Ht 62.0 in | Wt 167.5 lb

## 2017-06-30 DIAGNOSIS — G8929 Other chronic pain: Secondary | ICD-10-CM

## 2017-06-30 DIAGNOSIS — F419 Anxiety disorder, unspecified: Secondary | ICD-10-CM

## 2017-06-30 DIAGNOSIS — G4700 Insomnia, unspecified: Secondary | ICD-10-CM | POA: Diagnosis not present

## 2017-06-30 DIAGNOSIS — M5440 Lumbago with sciatica, unspecified side: Secondary | ICD-10-CM

## 2017-06-30 DIAGNOSIS — I83893 Varicose veins of bilateral lower extremities with other complications: Secondary | ICD-10-CM

## 2017-06-30 DIAGNOSIS — E876 Hypokalemia: Secondary | ICD-10-CM | POA: Diagnosis not present

## 2017-06-30 LAB — POTASSIUM: Potassium: 3.6 mEq/L (ref 3.5–5.1)

## 2017-06-30 MED ORDER — DULOXETINE HCL 60 MG PO CPEP: 60.0000 mg | ORAL_CAPSULE | Freq: Every day | ORAL | 2 refills | Status: DC

## 2017-06-30 MED ORDER — TRAZODONE HCL 50 MG PO TABS: 50.0000 mg | ORAL_TABLET | Freq: Every evening | ORAL | 1 refills | Status: DC | PRN

## 2017-06-30 MED ORDER — ALPRAZOLAM 0.5 MG PO TABS: 0.5000 mg | ORAL_TABLET | Freq: Two times a day (BID) | ORAL | 1 refills | Status: DC | PRN

## 2017-06-30 NOTE — Patient Instructions (Addendum)
A few things to remember from today's visit:   Insomnia, unspecified type - Plan: traZODone (DESYREL) 50 MG tablet  Chronic anxiety - Plan: DULoxetine (CYMBALTA) 60 MG capsule, ALPRAZolam (XANAX) 0.5 MG tablet  Chronic bilateral low back pain with sciatica, sciatica laterality unspecified  Varicose veins of bilateral lower extremities with other complications  Hypokalemia - Plan: Potassium  Continue Xanax 0.5 mg 2 times per day as needed and no more than 40 tabs for 30 days. Lexapro and Buspar stopped. Cymbalta increased. Trazodone 1/2 tab at bedtime started and can increase to 1 tab and 1.5 tab if needed. Doxepin stopped.   Please be sure medication list is accurate. If a new problem present, please set up appointment sooner than planned today.

## 2017-07-07 ENCOUNTER — Ambulatory Visit (INDEPENDENT_AMBULATORY_CARE_PROVIDER_SITE_OTHER): Payer: Medicare HMO | Admitting: Physical Medicine and Rehabilitation

## 2017-07-07 ENCOUNTER — Ambulatory Visit (INDEPENDENT_AMBULATORY_CARE_PROVIDER_SITE_OTHER): Payer: Self-pay

## 2017-07-07 ENCOUNTER — Encounter (INDEPENDENT_AMBULATORY_CARE_PROVIDER_SITE_OTHER): Payer: Self-pay | Admitting: Physical Medicine and Rehabilitation

## 2017-07-07 VITALS — BP 126/71 | HR 82

## 2017-07-07 DIAGNOSIS — M47816 Spondylosis without myelopathy or radiculopathy, lumbar region: Secondary | ICD-10-CM

## 2017-07-07 MED ORDER — LIDOCAINE HCL (PF) 1 % IJ SOLN
2.0000 mL | Freq: Once | INTRAMUSCULAR | Status: AC
  Administered 2017-07-07: 2 mL

## 2017-07-07 MED ORDER — METHYLPREDNISOLONE ACETATE 80 MG/ML IJ SUSP
80.0000 mg | Freq: Once | INTRAMUSCULAR | Status: AC
  Administered 2017-07-07: 80 mg

## 2017-07-07 NOTE — Progress Notes (Deleted)
Patient is here for planned bilateral L4-5 facet injection. No change in symptoms.

## 2017-07-07 NOTE — Progress Notes (Unsigned)
Fluoro Time: 21 sec  Mgy: 26.95

## 2017-07-07 NOTE — Patient Instructions (Signed)

## 2017-07-08 NOTE — Procedures (Signed)
Tonya Bradley is a 66 year old female that we recently saw for evaluation and management of her low back pain. We decided to time to complete bilateral L4-5 facet joint blocks diagnostically and hopefully therapeutically. Please see our prior evaluation and management note for further details and justification.  Lumbar Facet Joint Intra-Articular Injection(s) with Fluoroscopic Guidance  Patient: Tonya Bradley      Date of Birth: 1951-09-15 MRN: [id number] PCP: Martinique, [name], MD      Visit Date: 07/07/2017   Universal Protocol:    Date/Time: 07/07/2017  Consent Given By: the patient  Position: PRONE   Additional Comments: Vital signs were monitored before and after the procedure. Patient was prepped and draped in the usual sterile fashion. The correct patient, procedure, and site was verified.   Injection Procedure Details:  Procedure Site One Meds Administered:  Meds ordered this encounter  Medications  . lidocaine (PF) (XYLOCAINE) 1 % injection 2 mL  . methylPREDNISolone acetate (DEPO-MEDROL) injection 80 mg     Laterality: Bilateral  Location/Site:  L4-L5  Needle size: 22 guage  Needle type: Spinal  Needle Placement: Articular  Findings:  -Contrast Used: 1 mL iohexol 180 mg iodine/mL   -Comments: Excellent flow of contrast producing a partial arthrogram.  Procedure Details: The fluoroscope beam is vertically oriented in AP, and the inferior recess is visualized beneath the lower pole of the inferior apophyseal process, which represents the target point for needle insertion. When direct visualization is difficult the target point is located at the medial projection of the vertebral pedicle. The region overlying each aforementioned target is locally anesthetized with a 1 to 2 ml. volume of 1% Lidocaine without Epinephrine.   The spinal needle was inserted into each of the above mentioned facet joints using biplanar fluoroscopic guidance. A 0.25 to 0.5 ml. volume of  Isovue-250 was injected and a partial facet joint arthrogram was obtained. A single spot film was obtained of the resulting arthrogram.    One to 1.25 ml of the steroid/anesthetic solution was then injected into each of the facet joints noted above.   Additional Comments:  The patient tolerated the procedure well Dressing: Band-Aid    Post-procedure details: Patient was observed during the procedure. Post-procedure instructions were reviewed.  Patient left the clinic in stable condition.

## 2017-07-14 ENCOUNTER — Other Ambulatory Visit (INDEPENDENT_AMBULATORY_CARE_PROVIDER_SITE_OTHER): Payer: Self-pay | Admitting: Family

## 2017-07-14 ENCOUNTER — Telehealth (INDEPENDENT_AMBULATORY_CARE_PROVIDER_SITE_OTHER): Payer: Self-pay | Admitting: Physical Medicine and Rehabilitation

## 2017-07-14 MED ORDER — TRAMADOL HCL 50 MG PO TABS: 50.0000 mg | ORAL_TABLET | Freq: Four times a day (QID) | ORAL | 0 refills | Status: DC | PRN

## 2017-07-14 NOTE — Telephone Encounter (Signed)
Please see below.

## 2017-07-14 NOTE — Telephone Encounter (Signed)
Can you have Sharol Given or Junie Panning advise?

## 2017-07-14 NOTE — Telephone Encounter (Signed)
Pt requesting something for pain for her back. States the injections she had last week did not help and she is still in pain. Please give pt a call.

## 2017-07-15 NOTE — Telephone Encounter (Signed)
I called rx into pharm and called pt to advise that this has been done.

## 2017-07-30 ENCOUNTER — Ambulatory Visit (INDEPENDENT_AMBULATORY_CARE_PROVIDER_SITE_OTHER): Payer: Medicare HMO | Admitting: Cardiology

## 2017-07-30 ENCOUNTER — Encounter: Payer: Self-pay | Admitting: Cardiology

## 2017-07-30 VITALS — BP 112/74 | HR 83 | Ht 62.0 in | Wt 169.0 lb

## 2017-07-30 DIAGNOSIS — R079 Chest pain, unspecified: Secondary | ICD-10-CM

## 2017-07-30 DIAGNOSIS — I2510 Atherosclerotic heart disease of native coronary artery without angina pectoris: Secondary | ICD-10-CM

## 2017-07-30 DIAGNOSIS — I4892 Unspecified atrial flutter: Secondary | ICD-10-CM | POA: Diagnosis not present

## 2017-07-30 NOTE — Patient Instructions (Signed)
Medication Instructions:  START ASPIRIN 81 MG DAILY   Labwork: NONE  Testing/Procedures: Your physician has requested that you have en exercise stress myoview. For further information please visit [email or web address]. Please follow instruction sheet, as given.   Follow-Up: Your physician wants you to follow-up in: 6 MONTHS .  You will receive a reminder letter in the mail two months in advance. If you don't receive a letter, please call our office to schedule the follow-up appointment.   Any Other Special Instructions Will Be Listed Below (If Applicable).     If you need a refill on your cardiac medications before your next appointment, please call your pharmacy.

## 2017-07-30 NOTE — Progress Notes (Signed)
Clinical Summary Tonya Bradley is a 66 y.o.female seen today for follow up of the following medical problems.   1. Aflutter - admit 03/2016 with aflutter and RVR. Soft bp's, required amio gtt then converted to oral amio - s/p EP study and ablation 05/2017. Amio lowered to 100mg  and eliquis stopped by EP  - no recent palpitations. - compliant with meds    2. CAD - history of previous stenting - cath 2012 with patent coronaries   - can have some left arm pain at times. Mainly occurs at rest. Aching pain, 7/10 in severity. No other associated symptoms. Not positional.  - better with NG, lasts additional 10-15 mintues.  - pain occurs 2-3 times per a month - no relation to food.  - chronic SOB/DOE unchanged    Past Medical History:  Diagnosis Date  . Anemia   . Anxiety   . Atrial flutter ([place]) 03/13/2016  . CAD (coronary artery disease)   . Cancer ([place])    skin cancer  . Cataract   . Colon polyp   . COPD (chronic obstructive pulmonary disease) ([place])   . Depression   . Emphysema of lung ([place])   . GERD (gastroesophageal reflux disease)   . Hypercholesterolemia   . Osteoporosis   . Oxygen deficiency      No Known Allergies   Current Outpatient Prescriptions  Medication Sig Dispense Refill  . ALPRAZolam (XANAX) 0.5 MG tablet Take 1 tablet (0.5 mg total) by mouth 2 (two) times daily as needed for anxiety or sleep (No more than 40 tabs per month). 40 tablet 1  . amiodarone (PACERONE) 200 MG tablet Take 1/2 tablet (100 mg) by mouth once daily 90 tablet 3  . atorvastatin (LIPITOR) 80 MG tablet Take 80 mg by mouth daily.    . Cholecalciferol (VITAMIN D) 2000 units CAPS Take 2,000 Units by mouth daily.    . DULoxetine (CYMBALTA) 60 MG capsule Take 1 capsule (60 mg total) by mouth daily. 30 capsule 2  . furosemide (LASIX) 20 MG tablet Take 1 tablet (20 mg total) by mouth 2 (two) times daily. 60 tablet 1  . Multiple Vitamin (MULTIVITAMIN) tablet Take 1 tablet by mouth  daily.    . nitroGLYCERIN (NITROSTAT) 0.4 MG SL tablet Place 0.4 mg under the tongue every 5 (five) minutes as needed for chest pain. Reported on 06/12/2016    . pantoprazole (PROTONIX) 40 MG tablet Take 1 tablet (40 mg total) by mouth daily. 90 tablet 1  . rOPINIRole (REQUIP) 0.25 MG tablet Take 0.75 mg by mouth at bedtime.    Marland Kitchen tetrahydrozoline-zinc (VISINE-AC) 0.05-0.25 % ophthalmic solution Place 2 drops into both eyes 3 (three) times daily as needed (dry eyes).     . traMADol (ULTRAM) 50 MG tablet Take 1 tablet (50 mg total) by mouth every 6 (six) hours as needed. 30 tablet 0  . traZODone (DESYREL) 50 MG tablet Take 1 tablet (50 mg total) by mouth at bedtime as needed for sleep. 30 tablet 1   No current facility-administered medications for this visit.      Past Surgical History:  Procedure Laterality Date  . A-FLUTTER ABLATION N/A 05/19/2017   Procedure: A-Flutter Ablation;  Surgeon: Evans Lance, MD;  Location: [facility];  Service: Cardiovascular;  Laterality: N/A;  . APPENDECTOMY    . BASAL CELL CARCINOMA EXCISION    . CARDIAC CATHETERIZATION    . CHOLECYSTECTOMY    . COLONOSCOPY N/A 06/23/2014  Dr. Rourk:Rectal and colonic polyps-removed, Pancolonic diverticulosis. lymphocytic colitis  . CORONARY ANGIOPLASTY WITH STENT PLACEMENT    . ESOPHAGEAL DILATION N/A 03/13/2016   Procedure: ESOPHAGEAL DILATION;  Surgeon: Daneil Dolin, MD;  [facility];  Service: Endoscopy;  Laterality: N/A;  . ESOPHAGOGASTRODUODENOSCOPY N/A 03/13/2016   Procedure: ESOPHAGOGASTRODUODENOSCOPY (EGD);  Surgeon: Daneil Dolin, MD;  [facility];  Service: Endoscopy;  Laterality: N/A;  0930     No Known Allergies    Family History  Problem Relation Age of Onset  . Breast cancer Sister 49  . Heart disease Sister   . Heart attack Father   . Heart disease Father   . Stroke Mother   . Heart disease Mother   . Hyperlipidemia Mother   . Heart attack Sister   . Heart  disease Brother        ?heart failure  . Congestive Heart Failure Brother   . Hypertension Son   . Heart disease Maternal Grandfather   . Cancer Cousin   . Hypertension Son   . Colon cancer Neg Hx      Social History Tonya Bradley reports that she has been smoking Cigarettes.  She started smoking about 51 years ago. She has a 67.50 pack-year smoking history. She has never used smokeless tobacco. Tonya Bradley reports that she does not drink alcohol.   Review of Systems CONSTITUTIONAL: No weight loss, fever, chills, weakness or fatigue.  HEENT: Eyes: No visual loss, blurred vision, double vision or yellow sclerae.No hearing loss, sneezing, congestion, runny nose or sore throat.  SKIN: No rash or itching.  CARDIOVASCULAR:per hpi  RESPIRATORY: per hpi GASTROINTESTINAL: No anorexia, nausea, vomiting or diarrhea. No abdominal pain or blood.  GENITOURINARY: No burning on urination, no polyuria NEUROLOGICAL: No headache, dizziness, syncope, paralysis, ataxia, numbness or tingling in the extremities. No change in bowel or bladder control.  MUSCULOSKELETAL: No muscle, back pain, joint pain or stiffness.  LYMPHATICS: No enlarged nodes. No history of splenectomy.  PSYCHIATRIC: No history of depression or anxiety.  ENDOCRINOLOGIC: No reports of sweating, cold or heat intolerance. No polyuria or polydipsia.  Marland Kitchen   Physical Examination Vitals:   07/30/17 1326  BP: 112/74  Pulse: 83  SpO2: 91%   Vitals:   07/30/17 1326  Weight: 169 lb (76.7 kg)  Height: 5\' 2"  (1.575 m)    Gen: resting comfortably, no acute distress HEENT: no scleral icterus, pupils equal round and reactive, no palptable cervical adenopathy,  CV: RRR, no m/r/g, no jvd Resp: Clear to auscultation bilaterally GI: abdomen is soft, non-tender, non-distended, normal bowel sounds, no hepatosplenomegaly MSK: extremities are warm, no edema.  Skin: warm, no rash Neuro:  no focal deficits Psych: appropriate affect   Diagnostic  Studies 03/2016 echo Study Conclusions  - Left ventricle: The cavity size was normal. Wall thickness was  increased in a pattern of mild LVH. Systolic function was normal.  The estimated ejection fraction was in the range of 55% to 60%.  Wall motion was normal; there were no regional wall motion  abnormalities. - Aortic valve: Valve area (VTI): 2.56 cm^2. Valve area (Vmax):  2.15 cm^2. - Atrial septum: There was increased thickness of the septum,  consistent with lipomatous hypertrophy. - Inferior vena cava: The vessel was dilated. The respirophasic  diameter changes were blunted (<50%), consistent with elevated  central venous pressure. - Technically adequate study.    Assessment and Plan   1. Aflutter - CHADS2Vasc score is 2, she will  continue anticoag - no recent symptoms  2. CAD/Chest pain - recent chest pain symptoms. We will obtain an exercise nuclear stress test to further evaluate - start start ASA 81mg  daily   F/u 6 months      Arnoldo Lenis, M.D.

## 2017-08-05 ENCOUNTER — Telehealth (INDEPENDENT_AMBULATORY_CARE_PROVIDER_SITE_OTHER): Payer: Self-pay | Admitting: Physical Medicine and Rehabilitation

## 2017-08-05 ENCOUNTER — Telehealth (HOSPITAL_COMMUNITY): Payer: Self-pay | Admitting: *Deleted

## 2017-08-05 NOTE — Telephone Encounter (Signed)
Left message on voicemail per DPR in reference to upcoming appointment scheduled on 08/08/17 at 1130 with detailed instructions given per Myocardial Perfusion Study Information Sheet for the test. LM to arrive 15 minutes early, and that it is imperative to arrive on time for appointment to keep from having the test rescheduled. If you need to cancel or reschedule your appointment, please call the office within 24 hours of your appointment. Failure to do so may result in a cancellation of your appointment, and a $50 no show fee. Phone number given for call back for any questions.

## 2017-08-05 NOTE — Telephone Encounter (Signed)
Tonya Bradley may want to get [facility] MRI

## 2017-08-06 ENCOUNTER — Other Ambulatory Visit (INDEPENDENT_AMBULATORY_CARE_PROVIDER_SITE_OTHER): Payer: Self-pay | Admitting: Family

## 2017-08-06 ENCOUNTER — Telehealth (INDEPENDENT_AMBULATORY_CARE_PROVIDER_SITE_OTHER): Payer: Self-pay | Admitting: Radiology

## 2017-08-06 DIAGNOSIS — M5416 Radiculopathy, lumbar region: Secondary | ICD-10-CM

## 2017-08-06 NOTE — Telephone Encounter (Signed)
Please see messages below. Can you advise on this one?

## 2017-08-06 NOTE — Telephone Encounter (Signed)
Called and LVM -to call the office back

## 2017-08-06 NOTE — Telephone Encounter (Signed)
Called pt let her know referral MRI for the back and the office will call for more  deatails

## 2017-08-06 NOTE — Telephone Encounter (Signed)
error 

## 2017-08-08 ENCOUNTER — Encounter (HOSPITAL_COMMUNITY): Payer: Medicare HMO

## 2017-08-11 ENCOUNTER — Telehealth (HOSPITAL_COMMUNITY): Payer: Self-pay | Admitting: *Deleted

## 2017-08-11 NOTE — Telephone Encounter (Signed)
Patient given detailed instructions per Myocardial Perfusion Study Information Sheet for the test on 08/13/17 at 1000. Patient notified to arrive 15 minutes early and that it is imperative to arrive on time for appointment to keep from having the test rescheduled.  If you need to cancel or reschedule your appointment, please call the office within 24 hours of your appointment. . Patient verbalized understanding.Ellese Julius, [place]ana[place]

## 2017-08-13 ENCOUNTER — Ambulatory Visit (HOSPITAL_COMMUNITY): Payer: Medicare HMO | Attending: Cardiovascular Disease

## 2017-08-13 DIAGNOSIS — R0609 Other forms of dyspnea: Secondary | ICD-10-CM | POA: Insufficient documentation

## 2017-08-13 DIAGNOSIS — R079 Chest pain, unspecified: Secondary | ICD-10-CM | POA: Diagnosis not present

## 2017-08-13 DIAGNOSIS — Z8249 Family history of ischemic heart disease and other diseases of the circulatory system: Secondary | ICD-10-CM | POA: Insufficient documentation

## 2017-08-13 DIAGNOSIS — R0602 Shortness of breath: Secondary | ICD-10-CM | POA: Diagnosis not present

## 2017-08-13 DIAGNOSIS — I2510 Atherosclerotic heart disease of native coronary artery without angina pectoris: Secondary | ICD-10-CM | POA: Insufficient documentation

## 2017-08-13 LAB — MYOCARDIAL PERFUSION IMAGING
CHL CUP MPHR: 153 {beats}/min
CHL CUP NUCLEAR SRS: 1
CSEPPHR: 139 {beats}/min
Estimated workload: 8.8 METS
Exercise duration (min): 7 min
Exercise duration (sec): 10 s
LHR: 0.29
LV dias vol: 104 mL (ref 46–106)
LV sys vol: 43 mL
NUC STRESS TID: 1.05
Percent HR: 90 %
Rest HR: 71 {beats}/min
SDS: 1
SSS: 2

## 2017-08-13 MED ORDER — TECHNETIUM TC 99M TETROFOSMIN IV KIT
10.4000 | PACK | Freq: Once | INTRAVENOUS | Status: AC | PRN
  Administered 2017-08-13: 10.4 via INTRAVENOUS
  Filled 2017-08-13: qty 11

## 2017-08-13 MED ORDER — TECHNETIUM TC 99M TETROFOSMIN IV KIT
32.9000 | PACK | Freq: Once | INTRAVENOUS | Status: AC | PRN
  Administered 2017-08-13: 32.9 via INTRAVENOUS
  Filled 2017-08-13: qty 33

## 2017-08-20 ENCOUNTER — Ambulatory Visit (INDEPENDENT_AMBULATORY_CARE_PROVIDER_SITE_OTHER): Payer: Medicare HMO | Admitting: Family Medicine

## 2017-08-20 VITALS — BP 120/70 | Temp 98.2°F | Wt 172.9 lb

## 2017-08-20 DIAGNOSIS — R05 Cough: Secondary | ICD-10-CM

## 2017-08-20 DIAGNOSIS — R490 Dysphonia: Secondary | ICD-10-CM

## 2017-08-20 DIAGNOSIS — Z720 Tobacco use: Secondary | ICD-10-CM

## 2017-08-20 DIAGNOSIS — R059 Cough, unspecified: Secondary | ICD-10-CM

## 2017-08-20 MED ORDER — CEFUROXIME AXETIL 250 MG PO TABS: 250.0000 mg | ORAL_TABLET | Freq: Two times a day (BID) | ORAL | 0 refills | Status: DC

## 2017-08-20 NOTE — Progress Notes (Signed)
Subjective:     Patient ID: Tonya Bradley, female   DOB: January 09, 1951, 66 y.o.   MRN: [id number]  HPI Patient is a long-term smoker who is seen with 3-4 day history of sore throat, postnasal drainage, and mostly dry cough. Reported emphysema. She denies any recent fevers or chills. No facial pain. Still smokes.  No hemoptysis and no pleuritic pain.  Patient relates hoarseness for several months. She states over 15 years ago she had some vocal "polyps" removed. She's not had any further evaluation since then. She denies any obvious reflux symptoms. No chronic postnasal drip symptoms. Denies appetite or weight changes.  No dysphagia.  Husband thinks her hoarseness has progressed over the past 6-8 months.  Past Medical History:  Diagnosis Date  . Anemia   . Anxiety   . Atrial flutter ([place]) 03/13/2016  . CAD (coronary artery disease)   . Cancer ([place])    skin cancer  . Cataract   . Colon polyp   . COPD (chronic obstructive pulmonary disease) ([place])   . Depression   . Emphysema of lung ([place])   . GERD (gastroesophageal reflux disease)   . Hypercholesterolemia   . Osteoporosis   . Oxygen deficiency    Past Surgical History:  Procedure Laterality Date  . A-FLUTTER ABLATION N/A 05/19/2017   Procedure: A-Flutter Ablation;  Surgeon: Evans Lance, MD;  Location: [facility];  Service: Cardiovascular;  Laterality: N/A;  . APPENDECTOMY    . BASAL CELL CARCINOMA EXCISION    . CARDIAC CATHETERIZATION    . CHOLECYSTECTOMY    . COLONOSCOPY N/A 06/23/2014   Dr. Rourk:Rectal and colonic polyps-removed, Pancolonic diverticulosis. lymphocytic colitis  . CORONARY ANGIOPLASTY WITH STENT PLACEMENT    . ESOPHAGEAL DILATION N/A 03/13/2016   Procedure: ESOPHAGEAL DILATION;  Surgeon: Daneil Dolin, MD;  [facility];  Service: Endoscopy;  Laterality: N/A;  . ESOPHAGOGASTRODUODENOSCOPY N/A 03/13/2016   Procedure: ESOPHAGOGASTRODUODENOSCOPY (EGD);  Surgeon: Daneil Dolin, MD;  [facility];  Service: Endoscopy;  Laterality: N/A;  0930    reports that she has been smoking Cigarettes.  She started smoking about 51 years ago. She has a 67.50 pack-year smoking history. She has never used smokeless tobacco. She reports that she does not drink alcohol or use drugs. family history includes Breast cancer (age of onset: 28) in her sister; Cancer in her cousin; Congestive Heart Failure in her brother; Heart attack in her father and sister; Heart disease in her brother, father, maternal grandfather, mother, and sister; Hyperlipidemia in her mother; Hypertension in her son and son; Stroke in her mother. No Known Allergies   Review of Systems  Constitutional: Negative for chills and fever.  HENT: Positive for sore throat and voice change. Negative for postnasal drip, sinus pain and trouble swallowing.   Respiratory: Positive for cough.   Cardiovascular: Negative for chest pain.  Gastrointestinal: Negative for abdominal pain.       Objective:   Physical Exam  Constitutional: She appears well-developed and well-nourished.  HENT:  Right Ear: External ear normal.  Left Ear: External ear normal.  Nose: Nose normal.  Mouth/Throat: Oropharynx is clear and moist. No oropharyngeal exudate.  Neck: Neck supple. No thyromegaly present.  Cardiovascular: Normal rate and regular rhythm.   Pulmonary/Chest: Effort normal and breath sounds normal. No respiratory distress. She has no wheezes. She has no rales.  Lymphadenopathy:    She has no cervical adenopathy.       Assessment:     #  1 acute upper respiratory symptoms. Probably viral.  Reported COPD but in no distress.  #2 chronic hoarseness of several months duration in a smoker    Plan:     -Observation regarding recent acute upper respiratory symptoms. -Low threshold to start Ceftin 250 mg twice a day for 7 days for any fever, productive cough, or increasing shortness of breath -Set up ENT referral regarding her chronic hoarseness  of several months duration  Eulas Post MD [facility]

## 2017-08-20 NOTE — Patient Instructions (Signed)
Hoarseness Hoarseness is any abnormal change in your voice.Hoarseness can make it difficult to speak. Your voice may sound raspy, breathy, or strained. Hoarseness is caused by a problem with the vocal cords. The vocal cords are two bands of tissue inside your voice box (larynx). When you speak, your vocal cords move back and forth to create sound. The surfaces of your vocal cords need to be smooth for your voice to sound clear. Swelling or lumps on the vocal cords can cause hoarseness. Common causes of vocal cord problems include:  Upper airway infection.  A long-term cough.  Straining or overusing your voice.  Smoking.  Allergies.  Vocal cord growths.  Stomach acids that flow up from your stomach and irritate your vocal cords (gastroesophageal reflux).  Follow these instructions at home: Watch your condition for any changes. To ease any discomfort that you feel:  Rest your voice. Do not whisper. Whispering can cause muscle strain.  Do not speak in a loud or harsh voice that makes your hoarseness worse.  Do not use any tobacco products, including cigarettes, chewing tobacco, or electronic cigarettes. If you need help quitting, ask your health care provider.  Avoid secondhand smoke.  Do not eat foods that give you heartburn. Heartburn can make gastroesophageal reflux worse.  Do not drink coffee.  Do not drink alcohol.  Drink enough fluids to keep your urine clear or pale yellow.  Use a humidifier if the air in your home is dry.  Contact a health care provider if:  You have hoarseness that lasts longer than 3 weeks.  You almost lose or completelylose your voice for longer than 3 days.  You have pain when you swallow or try to talk.  You feel a lump in your neck. Get help right away if:  You have trouble swallowing.  You feel as though you are choking when you swallow.  You cough up blood or vomit blood.  You have trouble breathing. This information is not  intended to replace advice given to you by your health care provider. Make sure you discuss any questions you have with your health care provider. Document Released: 11/01/2005 Document Revised: 04/25/2016 Document Reviewed: 11/09/2014 Elsevier Interactive Patient Education  [employer].

## 2017-08-21 ENCOUNTER — Ambulatory Visit
Admission: RE | Admit: 2017-08-21 | Discharge: 2017-08-21 | Disposition: A | Discharge status: Home / Self Care | Payer: Medicare HMO | Source: Ambulatory Visit | Attending: Family | Admitting: Family

## 2017-08-21 ENCOUNTER — Encounter: Payer: Self-pay | Admitting: Family Medicine

## 2017-08-21 DIAGNOSIS — M5416 Radiculopathy, lumbar region: Secondary | ICD-10-CM

## 2017-08-27 ENCOUNTER — Ambulatory Visit (INDEPENDENT_AMBULATORY_CARE_PROVIDER_SITE_OTHER): Payer: Medicare HMO | Admitting: Family Medicine

## 2017-08-27 ENCOUNTER — Encounter: Payer: Self-pay | Admitting: Family Medicine

## 2017-08-27 ENCOUNTER — Ambulatory Visit (INDEPENDENT_AMBULATORY_CARE_PROVIDER_SITE_OTHER)
Admission: RE | Admit: 2017-08-27 | Discharge: 2017-08-27 | Disposition: A | Discharge status: Home / Self Care | Payer: Medicare HMO | Source: Ambulatory Visit | Attending: Family Medicine | Admitting: Family Medicine

## 2017-08-27 VITALS — BP 110/80 | HR 87 | Temp 98.0°F | Resp 16 | Ht 62.0 in | Wt 171.2 lb

## 2017-08-27 DIAGNOSIS — J441 Chronic obstructive pulmonary disease with (acute) exacerbation: Secondary | ICD-10-CM

## 2017-08-27 DIAGNOSIS — M5440 Lumbago with sciatica, unspecified side: Secondary | ICD-10-CM

## 2017-08-27 DIAGNOSIS — G8929 Other chronic pain: Secondary | ICD-10-CM | POA: Diagnosis not present

## 2017-08-27 DIAGNOSIS — I83893 Varicose veins of bilateral lower extremities with other complications: Secondary | ICD-10-CM | POA: Diagnosis not present

## 2017-08-27 LAB — CBC WITH DIFFERENTIAL/PLATELET
Basophils Absolute: 0 10*3/uL (ref 0.0–0.1)
Basophils Relative: 0.4 % (ref 0.0–3.0)
Eosinophils Absolute: 0.1 10*3/uL (ref 0.0–0.7)
Eosinophils Relative: 1 % (ref 0.0–5.0)
HEMATOCRIT: 39.3 % (ref 36.0–46.0)
Hemoglobin: 13.1 g/dL (ref 12.0–15.0)
LYMPHS ABS: 2.6 10*3/uL (ref 0.7–4.0)
Lymphocytes Relative: 29 % (ref 12.0–46.0)
MCHC: 33.5 g/dL (ref 30.0–36.0)
MCV: 90.9 fl (ref 78.0–100.0)
MONOS PCT: 9.3 % (ref 3.0–12.0)
Monocytes Absolute: 0.8 10*3/uL (ref 0.1–1.0)
NEUTROS ABS: 5.4 10*3/uL (ref 1.4–7.7)
NEUTROS PCT: 60.3 % (ref 43.0–77.0)
PLATELETS: 275 10*3/uL (ref 150.0–400.0)
RBC: 4.32 Mil/uL (ref 3.87–5.11)
RDW: 15.7 % — AB (ref 11.5–15.5)
WBC: 8.9 10*3/uL (ref 4.0–10.5)

## 2017-08-27 MED ORDER — ALBUTEROL SULFATE HFA 108 (90 BASE) MCG/ACT IN AERS: 2.0000 | INHALATION_SPRAY | Freq: Four times a day (QID) | RESPIRATORY_TRACT | 0 refills | Status: DC | PRN

## 2017-08-27 MED ORDER — PREDNISONE 20 MG PO TABS: 40.0000 mg | ORAL_TABLET | Freq: Every day | ORAL | 0 refills | Status: AC

## 2017-08-27 MED ORDER — IPRATROPIUM-ALBUTEROL 0.5-2.5 (3) MG/3ML IN SOLN
3.0000 mL | Freq: Once | RESPIRATORY_TRACT | Status: AC
  Administered 2017-08-27: 3 mL via RESPIRATORY_TRACT

## 2017-08-27 NOTE — Progress Notes (Signed)
ACUTE VISIT   HPI:  Chief Complaint  Patient presents with  . "sinuses"  . Leg Swelling  . Cough    [name] is a 66 y.o. female, who is here today with her husband complaining of 2 weeks of with cough, wheezing, and worsening dyspnea. She recently completed 7 days of Ceftin. Her husband was sick recently with similar symptoms. + Smoker.  Productive cough, she cannot bring sputum up.  Cough  This is a recurrent problem. The current episode started 1 to 4 weeks ago. The problem has been unchanged. The cough is productive of sputum. Associated symptoms include ear congestion, heartburn, nasal congestion, postnasal drip and rhinorrhea. Pertinent negatives include no chills, ear pain, eye redness, fever, headaches, hemoptysis, rash, sore throat, shortness of breath, sweats, weight loss or wheezing. The symptoms are aggravated by exercise. Risk factors for lung disease include smoking/tobacco exposure. Her past medical history is significant for COPD, emphysema and environmental allergies.     History of COPD, currently she is not using any inhaler. According to patient, her "oxygen is always low", she is supposed to be on supplemental oxygen at night but she did not tolerated.  LE edema: Today she is also complaining of lower extremity edema, this is a chronic problem and she denies new associated symptoms. She was recently evaluated by vascular (Dr Kellie Simmering, 06/23/17). According to patient,compression stockings were recommended but she has not worn them yet. She is currently on Furosemide 20 mg twice daily. Edema is exacerbated by prolonged standing and alleviated by elevation, it is usually better in the morning morning.   Hx of CAD and atrial fib, she follows with cardiologists.   Lab Results  Component Value Date   CREATININE 0.91 05/26/2017   BUN 14 05/26/2017   NA 141 05/26/2017   K 3.6 06/30/2017   CL 98 05/26/2017   CO2 37 (H) 05/26/2017    She is also  complaining of bilateral lower back pain, she is concerned about urine infection. Denies dysuria,increased urinary frequency, gross hematuria,or decreased urine output. She was recently evaluated by orthopedist, according to patient, she received to injection in her back, didn't help. She is not sure about follow-up appointment.  She had a lumbar MRI 08/21/17: The dominant findings are at L4-5. There is shallow protrusion of the disc more prominent towards the right. The facets ligaments are hypertrophic. There is stenosis of both lateral recesses right more than left. Neural compression could occur on either side, more likely the right. Findings could also contribute to low back pain. Old healed superior endplate compression deformity at L4. Minimal bulging of the disc and minimal facet arthritis.L5-S1 annular bulge and mild facet arthritis. No compressive stenosis.   Review of Systems  Constitutional: Positive for fatigue. Negative for activity change, appetite change, chills, fever and weight loss.  HENT: Positive for congestion, postnasal drip, rhinorrhea, sinus pressure and voice change. Negative for ear pain, mouth sores, nosebleeds, sneezing, sore throat and trouble swallowing.   Eyes: Negative for discharge, redness and itching.  Respiratory: Positive for cough. Negative for hemoptysis, shortness of breath and wheezing.   Cardiovascular: Positive for leg swelling. Negative for palpitations.  Gastrointestinal: Positive for heartburn. Negative for abdominal pain, diarrhea, nausea and vomiting.  Genitourinary: Negative for decreased urine volume, dysuria, frequency and urgency.  Musculoskeletal: Positive for back pain. Negative for neck pain.  Skin: Negative for rash and wound.  Allergic/Immunologic: Positive for environmental allergies.  Neurological: Negative for syncope, weakness  and headaches.  Hematological: Negative for adenopathy. Does not bruise/bleed easily.    Psychiatric/Behavioral: Negative for confusion. The patient is nervous/anxious.      Current Outpatient Prescriptions on File Prior to Visit  Medication Sig Dispense Refill  . ALPRAZolam (XANAX) 0.5 MG tablet Take 1 tablet (0.5 mg total) by mouth 2 (two) times daily as needed for anxiety or sleep (No more than 40 tabs per month). 40 tablet 1  . amiodarone (PACERONE) 200 MG tablet Take 1/2 tablet (100 mg) by mouth once daily 90 tablet 3  . aspirin EC 81 MG tablet Take 81 mg by mouth daily.    Marland Kitchen atorvastatin (LIPITOR) 80 MG tablet Take 80 mg by mouth daily.    . Cholecalciferol (VITAMIN D) 2000 units CAPS Take 2,000 Units by mouth daily.    . DULoxetine (CYMBALTA) 60 MG capsule Take 1 capsule (60 mg total) by mouth daily. 30 capsule 2  . furosemide (LASIX) 20 MG tablet Take 1 tablet (20 mg total) by mouth 2 (two) times daily. 60 tablet 1  . Multiple Vitamin (MULTIVITAMIN) tablet Take 1 tablet by mouth daily.    . nitroGLYCERIN (NITROSTAT) 0.4 MG SL tablet Place 0.4 mg under the tongue every 5 (five) minutes as needed for chest pain. Reported on 06/12/2016    . pantoprazole (PROTONIX) 40 MG tablet Take 1 tablet (40 mg total) by mouth daily. 90 tablet 1  . RESTASIS MULTIDOSE 0.05 % ophthalmic emulsion INSTILL 1 DROP INTO EACH EYE BID  0  . rOPINIRole (REQUIP) 0.25 MG tablet Take 0.75 mg by mouth at bedtime.    . traZODone (DESYREL) 50 MG tablet Take 1 tablet (50 mg total) by mouth at bedtime as needed for sleep. 30 tablet 1   No current facility-administered medications on file prior to visit.      Past Medical History:  Diagnosis Date  . Anemia   . Anxiety   . Atrial flutter ([place]) 03/13/2016  . CAD (coronary artery disease)   . Cancer ([place])    skin cancer  . Cataract   . Colon polyp   . COPD (chronic obstructive pulmonary disease) ([place])   . Depression   . Emphysema of lung ([place])   . GERD (gastroesophageal reflux disease)   . Hypercholesterolemia   . Osteoporosis   . Oxygen  deficiency    No Known Allergies  Social History   Social History  . Marital status: Married    Spouse name: N/A  . Number of children: 3  . Years of education: 10   Occupational History  . 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 Smoking status: Current Every Day Smoker    Packs/day: 1.50    Years: 45.00    Types: Cigarettes    Start date: 06/14/1966  . Smokeless tobacco: Never Used     Comment: 1-2 packs daily  . Alcohol use No  . Drug use: No  . Sexual activity: Yes    Birth control/ protection: Post-menopausal   Other Topics Concern  . None   Social History Narrative   Quit high school and got married in 10th grade, age 56   First husband died of colon cancer at young age   Married again 2018   Lives in own home   Daughter lives with her   Drinks coffee seldom, too much "sweet tea"    Vitals:   08/27/17 1356  BP: 110/80  Pulse: 87  Resp: 16  Temp: 98 F (36.7 C)  SpO2: 90%   Body mass index is 31.32 kg/m.   Physical Exam  Nursing note and vitals reviewed. Constitutional: She is oriented to person, place, and time. She appears well-developed. She does not appear ill. No distress.  HENT:  Head: Normocephalic and atraumatic.  Right Ear: Tympanic membrane, external ear and ear canal normal.  Left Ear: Tympanic membrane, external ear and ear canal normal.  Nose: Rhinorrhea present. Right sinus exhibits no maxillary sinus tenderness and no frontal sinus tenderness. Left sinus exhibits no maxillary sinus tenderness and no frontal sinus tenderness.  Mouth/Throat: Oropharynx is clear and moist and mucous membranes are normal.  Mild pressure sensation left maxillary sinus. + Dysphonia. Post nasal drainage.  Eyes: Pupils are equal, round, and reactive to light. Conjunctivae are normal.  Neck: No muscular tenderness present. No edema and no erythema present.  Cardiovascular: Normal rate and regular rhythm.   No murmur  heard. Pulses:      Dorsalis pedis pulses are 2+ on the right side, and 2+ on the left side.  Varicose veins LE, bilateral  Respiratory: Effort normal. No stridor. No respiratory distress. She has wheezes. She has no rales.  GI: Soft. She exhibits no mass. There is no tenderness.  Musculoskeletal: She exhibits edema (2+ pitting LE edema, bilateral).       Lumbar back: She exhibits tenderness. She exhibits no bony tenderness.  Lymphadenopathy:       Head (right side): No submandibular adenopathy present.       Head (left side): No submandibular adenopathy present.    She has no cervical adenopathy.  Neurological: She is alert and oriented to person, place, and time. She has normal strength.  Stable gait with no assistance.  Skin: Skin is warm. No rash noted. No erythema.  Psychiatric: Her speech is normal. Her mood appears anxious.  Well groomed, good eye contact.    ASSESSMENT AND PLAN:   [name] was seen today for "sinuses", leg swelling and cough.  Diagnoses and all orders for this visit:  Lab Results  Component Value Date   WBC 8.9 08/27/2017   HGB 13.1 08/27/2017   HCT 39.3 08/27/2017   MCV 90.9 08/27/2017   PLT 275.0 08/27/2017    COPD with exacerbation ([place])  Here in the office and after discussion os side effects she received Duoneb neb treatment. Lung auscultation after breathing treatment wheezing improved, no rales. I do not think abx is needed again, side effects discussed. Prednisone side effects discussed. Albuterol inh 2 puff every 6 hours for a week then as needed for wheezing or shortness of breath.  Smoking cessation recommended. F/U in 2 weeks.  CXR and CBC ordered today and further recommendations will be given accordingly.  -     ipratropium-albuterol (DUONEB) 0.5-2.5 (3) MG/3ML nebulizer solution 3 mL; Take 3 mLs by nebulization once. -     CBC with Differential/Platelet -     DG Chest 2 View; Future -     predniSONE (DELTASONE) 20 MG tablet;  Take 2 tablets (40 mg total) by mouth daily with breakfast. -     albuterol (PROVENTIL HFA;VENTOLIN HFA) 108 (90 Base) MCG/ACT inhaler; Inhale 2 puffs into the lungs every 6 (six) hours as needed for wheezing or shortness of breath.  Varicose veins of bilateral lower extremities with other complications  Educated about Dx, prognosis,and treatment options. She has had vascular evaluation, on Furosemide,and not wearing compression stocking. Side effects of  diuretics discussed. Skin care. Handout about compression stocking given.  Chronic bilateral low back pain with sciatica, sciatica laterality unspecified  Chronic. She is not having urine symptoms, so I do not think further work up is needed in this regard. Instructed to monitor for new associated symptoms. Recommend continuing following with ortho.    Return in about 2 weeks (around 09/10/2017) for copd.     -[name] was advised to seek immediate medical attention if sudden worsening symptoms.     Tonya Bradley G. [place], MD  [facility]. [place] office.

## 2017-08-27 NOTE — Patient Instructions (Signed)
A few things to remember from today's visit:   Varicose veins of bilateral lower extremities with other complications  COPD with exacerbation ([place]) - Plan: ipratropium-albuterol (DUONEB) 0.5-2.5 (3) MG/3ML nebulizer solution 3 mL, CBC with Differential/Platelet, DG Chest 2 View  Chronic bilateral low back pain with sciatica, sciatica laterality unspecified  Smoking cessation strongly recommended.   Please be sure medication list is accurate. If a new problem present, please set up appointment sooner than planned today.

## 2017-08-28 ENCOUNTER — Encounter: Payer: Self-pay | Admitting: Family Medicine

## 2017-08-30 ENCOUNTER — Other Ambulatory Visit: Payer: Self-pay | Admitting: Family Medicine

## 2017-08-30 DIAGNOSIS — G4700 Insomnia, unspecified: Secondary | ICD-10-CM

## 2017-09-01 ENCOUNTER — Other Ambulatory Visit: Payer: Self-pay

## 2017-09-01 DIAGNOSIS — F419 Anxiety disorder, unspecified: Secondary | ICD-10-CM

## 2017-09-01 NOTE — Telephone Encounter (Signed)
Patient needs a refill on the Alprazolam.   Please advise

## 2017-09-02 ENCOUNTER — Telehealth (INDEPENDENT_AMBULATORY_CARE_PROVIDER_SITE_OTHER): Payer: Self-pay | Admitting: Orthopaedic Surgery

## 2017-09-02 ENCOUNTER — Other Ambulatory Visit: Payer: Self-pay | Admitting: Family Medicine

## 2017-09-02 DIAGNOSIS — F419 Anxiety disorder, unspecified: Secondary | ICD-10-CM

## 2017-09-02 NOTE — Telephone Encounter (Signed)
Last Rx for Alprazolam 0.5 mg was filled on 07/31/17 and I think she has a refill available.   Thanks, BJ

## 2017-09-02 NOTE — Telephone Encounter (Signed)
Rx phoned in. See other refill note.

## 2017-09-02 NOTE — Telephone Encounter (Signed)
rx phoned in

## 2017-09-02 NOTE — Telephone Encounter (Signed)
noted 

## 2017-09-02 NOTE — Telephone Encounter (Signed)
Called patient scheduled  MRI review with Dr Lorin Mercy 09/23/17 at 3:15 pm.

## 2017-09-09 DIAGNOSIS — J449 Chronic obstructive pulmonary disease, unspecified: Secondary | ICD-10-CM | POA: Insufficient documentation

## 2017-09-09 NOTE — Progress Notes (Signed)
HPI:   Tonya Bradley is a 66 y.o. female, who is here today with her husband to follow on recent OV.   She was seen on 08/27/17 because persistent respiratory symptoms: cough and wheezing. 08/20/17 she was treated with Cefuroxine 250 mg bid x 7 d. She is on supplemental O2 as needed.  Still smoking.   Lab Results  Component Value Date   WBC 8.9 08/27/2017   HGB 13.1 08/27/2017   HCT 39.3 08/27/2017   MCV 90.9 08/27/2017   PLT 275.0 08/27/2017   KGM:[id number] on 08/28/17.  She was started on Albuterol inh and Prednisone.  Still coughing but just "a little bit", symptoms improved. Occasional wheezing with exertion. Dyspnea is back to her baseline.  She has not fever or chills.  She is c/o constipation and lower abdominal pain, started 3 weeks ago. She has had similar symptoms intermittently for 5-6 months. According to her husband, she has had GI problems for a few years.  Cramp like abdominal pain improves with defecation.  Colonoscopy 3-4 years ago because diarrhea and wt loss.  According to pt, she was Dx with "colitis", last GI visit 2-3 years ago. She has bowel movements weekly.  She denies nausea,vomiting,blood in stool, or melena.  She is on OTC Miralax.   Review of Systems  Constitutional: Positive for fatigue. Negative for activity change, appetite change and fever.  HENT: Negative for mouth sores, sore throat and trouble swallowing.   Respiratory: Positive for cough and wheezing. Negative for shortness of breath.   Gastrointestinal: Positive for abdominal pain and constipation. Negative for abdominal distention, blood in stool, nausea and vomiting.  Endocrine: Negative for cold intolerance and heat intolerance.  Genitourinary: Negative for decreased urine volume, dysuria, frequency and hematuria.  Musculoskeletal: Positive for back pain (chronic). Negative for gait problem.  Skin: Negative for pallor and rash.  Allergic/Immunologic: Negative for  food allergies.  Neurological: Negative for syncope, weakness and headaches.  Hematological: Negative for adenopathy. Does not bruise/bleed easily.  Psychiatric/Behavioral: Negative for confusion. The patient is nervous/anxious.       Current Outpatient Prescriptions on File Prior to Visit  Medication Sig Dispense Refill  . albuterol (PROVENTIL HFA;VENTOLIN HFA) 108 (90 Base) MCG/ACT inhaler Inhale 2 puffs into the lungs every 6 (six) hours as needed for wheezing or shortness of breath. 1 Inhaler 0  . ALPRAZolam (XANAX) 0.5 MG tablet TAKE 1 TABLET BY MOUTH TWICE DAILY AS NEEDED FOR ANXIETY OR SLEEP 40 tablet 1  . amiodarone (PACERONE) 200 MG tablet Take 1/2 tablet (100 mg) by mouth once daily 90 tablet 3  . aspirin EC 81 MG tablet Take 81 mg by mouth daily.    Marland Kitchen atorvastatin (LIPITOR) 80 MG tablet Take 80 mg by mouth daily.    . Cholecalciferol (VITAMIN D) 2000 units CAPS Take 2,000 Units by mouth daily.    . DULoxetine (CYMBALTA) 60 MG capsule Take 1 capsule (60 mg total) by mouth daily. 30 capsule 2  . furosemide (LASIX) 20 MG tablet Take 1 tablet (20 mg total) by mouth 2 (two) times daily. 60 tablet 1  . Multiple Vitamin (MULTIVITAMIN) tablet Take 1 tablet by mouth daily.    . nitroGLYCERIN (NITROSTAT) 0.4 MG SL tablet Place 0.4 mg under the tongue every 5 (five) minutes as needed for chest pain. Reported on 06/12/2016    . pantoprazole (PROTONIX) 40 MG tablet Take 1 tablet (40 mg total) by mouth daily. 90 tablet 1  . RESTASIS  MULTIDOSE 0.05 % ophthalmic emulsion INSTILL 1 DROP INTO EACH EYE BID  0  . rOPINIRole (REQUIP) 0.25 MG tablet Take 0.75 mg by mouth at bedtime.    . traZODone (DESYREL) 50 MG tablet TAKE 1 TABLET (50 MG TOTAL) BY MOUTH AT BEDTIME AS NEEDED FOR SLEEP. 90 tablet 1   No current facility-administered medications on file prior to visit.      Past Medical History:  Diagnosis Date  . Anemia   . Anxiety   . Atrial flutter ([place]) 03/13/2016  . CAD (coronary artery  disease)   . Cancer ([place])    skin cancer  . Cataract   . Colon polyp   . COPD (chronic obstructive pulmonary disease) ([place])   . Depression   . Emphysema of lung ([place])   . GERD (gastroesophageal reflux disease)   . Hypercholesterolemia   . Osteoporosis   . Oxygen deficiency    No Known Allergies  Social History   Social History  . Marital status: Married    Spouse name: N/A  . Number of children: 3  . Years of education: 10   Occupational History  . 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 Smoking status: Current Every Day Smoker    Packs/day: 1.50    Years: 45.00    Types: Cigarettes    Start date: 06/14/1966  . Smokeless tobacco: Never Used     Comment: 1-2 packs daily  . Alcohol use No  . Drug use: No  . Sexual activity: Yes    Birth control/ protection: Post-menopausal   Other Topics Concern  . None   Social History Narrative   Quit high school and got married in 10th grade, age 32   First husband died of colon cancer at young age   Married again 2018   Lives in own home   Daughter lives with her   Drinks coffee seldom, too much "sweet tea"    Vitals:   09/10/17 1422  BP: 120/78  Pulse: 80  Resp: 16  SpO2: 91%   Body mass index is 30.96 kg/m.    Physical Exam  Nursing note and vitals reviewed. Constitutional: She is oriented to person, place, and time. She appears well-developed. No distress.  HENT:  Head: Normocephalic and atraumatic.  Mouth/Throat: Oropharynx is clear and moist and mucous membranes are normal.  Eyes: Pupils are equal, round, and reactive to light. Conjunctivae are normal.  Neck: No JVD present.  Cardiovascular: Normal rate and regular rhythm.   No murmur heard. Pulses:      Dorsalis pedis pulses are 2+ on the right side, and 2+ on the left side.  Respiratory: Effort normal and breath sounds normal. No respiratory distress.  Prolonged expiration.  GI: Soft. Bowel sounds are normal. She  exhibits no mass. There is no hepatomegaly. There is no tenderness.  Musculoskeletal: She exhibits edema (2+ pitting LE edema,bilateral). She exhibits no tenderness.  Lymphadenopathy:    She has no cervical adenopathy.  Neurological: She is alert and oriented to person, place, and time. She has normal strength. Coordination normal.  Skin: Skin is warm. No erythema.  Psychiatric: Her mood appears anxious.  Well groomed, good eye contact.    ASSESSMENT AND PLAN:   Tonya Bradley was seen today for follow-up.  Diagnoses and all orders for this visit:  Abdominal pain, lower  We discussed possible etiologies, it seems to be a chronic problem and could be related  to constipation or diverticulosis Instructed about warning signs.  Abdominal CT 08/2014:  No mass or adenopathy is seen in the abdomen or pelvis. There is sigmoid diverticulosis without diverticulitis. Mild wall thickening in the sigmoid colon is felt to be due to muscular hypertrophy from chronic diverticulosis. No mesenteric inflammation or abscess.  No bowel obstruction. Gallbladder absent.  Constipation, unspecified constipation type  Miralax is not helping. Adequate fiber and fluid intake.  After discussion of a few treatment options and side effects, she agrees with trying Linzess. Instructed about warning signs. F/U in 2 months.  -     linaclotide (LINZESS) 145 MCG CAPS capsule; Take 1 capsule (145 mcg total) by mouth daily before breakfast.  Pulmonary emphysema, unspecified emphysema type ([place])  Acute exacerbation improved. Strongly recommend smoking cessation. Symbicort 80-4.5 mcg bid started. Albuterol inh to continue as needed. F/U in 2 months.  Chest CT screening 04/2017: Negative.  -     budesonide-formoterol (SYMBICORT) 80-4.5 MCG/ACT inhaler; Inhale 2 puffs into the lungs 2 (two) times daily.  Tobacco abuse  Adverse effects discussed as well as benefits of smoking cessation. Encouraged to quit  smoking.   Need for influenza vaccination -     Flu vaccine HIGH DOSE PF       Gordy Goar G. [place], MD  [facility]. [place] office.

## 2017-09-10 ENCOUNTER — Ambulatory Visit (INDEPENDENT_AMBULATORY_CARE_PROVIDER_SITE_OTHER): Payer: Medicare HMO | Admitting: Family Medicine

## 2017-09-10 ENCOUNTER — Encounter: Payer: Self-pay | Admitting: Family Medicine

## 2017-09-10 VITALS — BP 120/78 | HR 80 | Resp 16 | Ht 62.0 in | Wt 169.2 lb

## 2017-09-10 DIAGNOSIS — Z720 Tobacco use: Secondary | ICD-10-CM

## 2017-09-10 DIAGNOSIS — Z23 Encounter for immunization: Secondary | ICD-10-CM

## 2017-09-10 DIAGNOSIS — K5900 Constipation, unspecified: Secondary | ICD-10-CM

## 2017-09-10 DIAGNOSIS — J439 Emphysema, unspecified: Secondary | ICD-10-CM | POA: Diagnosis not present

## 2017-09-10 DIAGNOSIS — R1030 Lower abdominal pain, unspecified: Secondary | ICD-10-CM | POA: Diagnosis not present

## 2017-09-10 MED ORDER — LINACLOTIDE 145 MCG PO CAPS: 145.0000 ug | ORAL_CAPSULE | Freq: Every day | ORAL | 1 refills | Status: DC

## 2017-09-10 MED ORDER — BUDESONIDE-FORMOTEROL FUMARATE 80-4.5 MCG/ACT IN AERO: 2.0000 | INHALATION_SPRAY | Freq: Two times a day (BID) | RESPIRATORY_TRACT | 3 refills | Status: DC

## 2017-09-10 NOTE — Patient Instructions (Signed)
A few things to remember from today's visit:   Constipation, unspecified constipation type - Plan: linaclotide (LINZESS) 145 MCG CAPS capsule  Pulmonary emphysema, unspecified emphysema type ([place]) - Plan: budesonide-formoterol (SYMBICORT) 80-4.5 MCG/ACT inhaler  Benefiber may help with constipation.  Smoking cessation.  Symbicort 2 times daily.   Please be sure medication list is accurate. If a new problem present, please set up appointment sooner than planned today.

## 2017-09-12 DIAGNOSIS — H353121 Nonexudative age-related macular degeneration, left eye, early dry stage: Secondary | ICD-10-CM | POA: Diagnosis not present

## 2017-09-12 DIAGNOSIS — H2513 Age-related nuclear cataract, bilateral: Secondary | ICD-10-CM | POA: Diagnosis not present

## 2017-09-12 DIAGNOSIS — H2512 Age-related nuclear cataract, left eye: Secondary | ICD-10-CM | POA: Diagnosis not present

## 2017-09-12 DIAGNOSIS — H25043 Posterior subcapsular polar age-related cataract, bilateral: Secondary | ICD-10-CM | POA: Diagnosis not present

## 2017-09-12 DIAGNOSIS — H25013 Cortical age-related cataract, bilateral: Secondary | ICD-10-CM | POA: Diagnosis not present

## 2017-09-19 ENCOUNTER — Other Ambulatory Visit: Payer: Self-pay | Admitting: Family Medicine

## 2017-09-19 DIAGNOSIS — J441 Chronic obstructive pulmonary disease with (acute) exacerbation: Secondary | ICD-10-CM

## 2017-09-23 ENCOUNTER — Encounter (INDEPENDENT_AMBULATORY_CARE_PROVIDER_SITE_OTHER): Payer: Self-pay | Admitting: Orthopaedic Surgery

## 2017-09-23 ENCOUNTER — Ambulatory Visit (INDEPENDENT_AMBULATORY_CARE_PROVIDER_SITE_OTHER): Payer: Medicare HMO | Admitting: Orthopaedic Surgery

## 2017-09-23 VITALS — BP 116/74 | HR 80 | Ht 62.0 in | Wt 163.0 lb

## 2017-09-23 DIAGNOSIS — M48062 Spinal stenosis, lumbar region with neurogenic claudication: Secondary | ICD-10-CM | POA: Diagnosis not present

## 2017-09-23 NOTE — Progress Notes (Signed)
Office Visit Note  Patient: Tonya Bradley           Date of Birth: 01-18-1951           MRN: [id number] Visit Date: 09/23/2017              Requested by: Martinique, [name], MD [id number] [place], [place] [address] PCP: Martinique, [name], MD   Assessment & Plan: Visit Diagnoses:  1. Spinal stenosis of lumbar region with neurogenic claudication     Plan: We reviewed the MRI with patient her husband. She has significant disc protrusion with stenosis multifactorial involving the lateral recess worse on the right than left. Old superior endplate compression deformity L4 which is healed. Other levels show no significant compression. We discussed single level decompression surgery for her stenosis with claudication symptoms. She has no instability and plan will be overnight stay. Questions were elicited and answered operative technique discussed. MRI scan was reviewed again copy of the report and office follow-up will be the after surgery. She has some upcoming eye surgery and would like to schedule the surgery in January.Review of the pertinent images x-rays history physical exam made with patient in decision for surgery made today.  Follow-Up Instructions: No Follow-up on file.   Orders:  No orders of the defined types were placed in this encounter.  No orders of the defined types were placed in this encounter.     Procedures: No procedures performed   Clinical Data: No additional findings.   Subjective: Chief Complaint  Patient presents with  . Lower Back - Pain    HPI 66 year old females been followed for more than 6 months with persistent problems with back and leg pain with inability to walk an eighth of a mile with weakness in her legs are required to sit. She does better she leans over a grocery cart with walking. When she standing she has stopped her chest on the sink to get some relief. Her symptoms are gradually progress she is here with her husband today who added  to 2 her history. Patient's been on prednisone packs had epidural steroid injection in July by Dr. Ernestina Patches  with persistent symptoms. Patient also was treated with Cymbalta with gave her a little bit of improvement.  Review of Systems  Constitutional: Negative for chills and diaphoresis.  HENT: Negative for ear discharge, ear pain and nosebleeds.   Eyes: Negative for discharge and visual disturbance.  Respiratory: Negative for cough, choking and shortness of breath.   Cardiovascular: Negative for chest pain and palpitations.  Gastrointestinal: Negative for abdominal distention and abdominal pain.  Endocrine: Negative for cold intolerance and heat intolerance.  Genitourinary: Negative for flank pain and hematuria.  Skin: Negative for rash and wound.  Neurological: Negative for seizures and speech difficulty.  Hematological: Negative for adenopathy. Does not bruise/bleed easily.  Psychiatric/Behavioral: Negative for agitation and suicidal ideas.     Objective: Vital Signs: BP 116/74   Pulse 80   Ht 5\' 2"  (1.575 m)   Wt 163 lb (73.9 kg)   BMI 29.81 kg/m   Physical Exam  Constitutional: She is oriented to person, place, and time. She appears well-developed.  HENT:  Head: Normocephalic.  Right Ear: External ear normal.  Left Ear: External ear normal.  Eyes: Pupils are equal, round, and reactive to light.  Neck: No tracheal deviation present. No thyromegaly present.  Cardiovascular: Normal rate.   Pulmonary/Chest: Effort normal.  Abdominal: Soft.  Neurological: She is alert  and oriented to person, place, and time.  Skin: Skin is warm and dry.  Psychiatric: She has a normal mood and affect. Her behavior is normal.    Ortho Exam normal hip range of motion negative straight leg raising. Anterior tib EHL is intact. No pedal edema distal pulses are palpable.  Specialty Comments:  No specialty comments available.  Imaging: Show images for MR LUMBAR SPINE WO CONTRAST  Study Result     CLINICAL DATA:  Low back pain and bilateral leg pain over the last 5 months.  EXAM: MRI LUMBAR SPINE WITHOUT CONTRAST  TECHNIQUE: Multiplanar, multisequence MR imaging of the lumbar spine was performed. No intravenous contrast was administered.  COMPARISON:  Radiography 05/08/2017  FINDINGS: Segmentation:  5 lumbar type vertebral bodies.  Alignment:  Normal  Vertebrae: Old superior endplate compression deformity at L4 which is completely healed. No other focal bone finding.  Conus medullaris: Extends to the L1 level and appears normal.  Paraspinal and other soft tissues: Negative  Disc levels:  No significant finding at L2-3 or above.  L3-4: Minimal bulging of the disc. Minimal facet hypertrophy. No compressive stenosis.  L4-5: Circumferential disc protrusion more prominent towards the right. Bilateral facet and ligamentous hypertrophy. Stenosis of both lateral recesses right more than left. Neural compression could occur at this level, particularly on the right.  L5-S1: Annular fissures and annular bulging. Mild facet hypertrophy. No compressive stenosis.  IMPRESSION: The dominant findings are at L4-5. There is shallow protrusion of the disc more prominent towards the right. The facets ligaments are hypertrophic. There is stenosis of both lateral recesses right more than left. Neural compression could occur on either side, more likely the right. Findings could also contribute to low back pain.  Old healed superior endplate compression deformity at L4. Minimal bulging of the disc and minimal facet arthritis.  L5-S1 annular bulge and mild facet arthritis. No compressive stenosis.   Electronically Signed   By: Nelson Chimes M.D.   On: 08/21/2017 14:21      PMFS History: Patient Active Problem List   Diagnosis Date Noted  . 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 COPD (chronic obstructive pulmonary  disease) ([place]) 09/09/2017  . Varicose veins of bilateral lower extremities with other complications 56/38/7564  . Chronic pain disorder 05/26/2017  . Back pain 05/26/2017  . Insomnia 05/26/2017  . Osteopenia 11/26/2016  . Other emphysema ([place]) 11/07/2016  . Chronic anxiety 11/07/2016  . Nocturnal hypoxemia due to emphysema ([place]) 11/07/2016  . Constipation 06/12/2016  . Right carotid bruit 04/03/2016  . CAD (coronary artery disease) 04/03/2016  . Tobacco abuse 04/03/2016  . Atrial flutter ([place]) 03/13/2016  . Hiatal hernia   . Reflux esophagitis   . Schatzki's ring   . Dysphagia 03/01/2016  . Solitary pulmonary nodule 03/01/2016  . 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 Microscopic colitis 08/15/2014  . Loose stools 06/08/2014  . BCC (basal cell carcinoma), face 09/13/2012   Past Medical History:  Diagnosis Date  . Anemia   . Anxiety   . Atrial flutter ([place]) 03/13/2016  . CAD (coronary artery disease)   . Cancer ([place])    skin cancer  . Cataract   . Colon polyp   . COPD (chronic obstructive pulmonary disease) ([place])   . Depression   . Emphysema of lung ([place])   . GERD (gastroesophageal reflux disease)   . Hypercholesterolemia   . Osteoporosis   . Oxygen deficiency     Family History  Problem Relation Age of Onset  . Breast cancer Sister 77  . Heart disease Sister   . Heart attack Father   . Heart disease Father   . Stroke Mother   . Heart disease Mother   . Hyperlipidemia Mother   . Heart attack Sister   . Heart disease Brother        ?heart failure  . Congestive Heart Failure Brother   . Hypertension Son   . Heart disease Maternal Grandfather   . Cancer Cousin   . Hypertension Son   . Colon cancer Neg Hx     Past Surgical History:  Procedure Laterality Date  . A-FLUTTER ABLATION N/A 05/19/2017   Procedure: A-Flutter Ablation;  Surgeon: Evans Lance, MD;  Location: [facility];  Service: Cardiovascular;  Laterality: N/A;  . APPENDECTOMY    .  BASAL CELL CARCINOMA EXCISION    . CARDIAC CATHETERIZATION    . CHOLECYSTECTOMY    . COLONOSCOPY N/A 06/23/2014   Dr. Rourk:Rectal and colonic polyps-removed, Pancolonic diverticulosis. lymphocytic colitis  . CORONARY ANGIOPLASTY WITH STENT PLACEMENT    . ESOPHAGEAL DILATION N/A 03/13/2016   Procedure: ESOPHAGEAL DILATION;  Surgeon: Daneil Dolin, MD;  [facility];  Service: Endoscopy;  Laterality: N/A;  . ESOPHAGOGASTRODUODENOSCOPY N/A 03/13/2016   Procedure: ESOPHAGOGASTRODUODENOSCOPY (EGD);  Surgeon: Daneil Dolin, MD;  Location: [facility];  Service: Endoscopy;  Laterality: N/A;  0930   Social History   Occupational History  . 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 Smoking status: Current Every Day Smoker    Packs/day: 1.50    Years: 45.00    Types: Cigarettes    Start date: 06/14/1966  . Smokeless tobacco: Never Used     Comment: 1-2 packs daily  . Alcohol use No  . Drug use: No  . Sexual activity: Yes    Birth control/ protection: Post-menopausal

## 2017-09-25 ENCOUNTER — Telehealth (INDEPENDENT_AMBULATORY_CARE_PROVIDER_SITE_OTHER): Payer: Self-pay | Admitting: Orthopaedic Surgery

## 2017-09-25 DIAGNOSIS — J310 Chronic rhinitis: Secondary | ICD-10-CM | POA: Diagnosis not present

## 2017-09-25 DIAGNOSIS — R490 Dysphonia: Secondary | ICD-10-CM | POA: Diagnosis not present

## 2017-09-25 NOTE — Telephone Encounter (Signed)
Malachy Mood, patient spouse wants you to call him in regards to scheduling the patients surgery. Dr. Lorin Mercy is the MD

## 2017-09-29 ENCOUNTER — Other Ambulatory Visit: Payer: Self-pay | Admitting: Family Medicine

## 2017-09-29 ENCOUNTER — Ambulatory Visit: Payer: Medicare HMO | Admitting: Family Medicine

## 2017-09-29 DIAGNOSIS — I83813 Varicose veins of bilateral lower extremities with pain: Secondary | ICD-10-CM

## 2017-10-15 ENCOUNTER — Other Ambulatory Visit: Payer: Self-pay | Admitting: Family Medicine

## 2017-10-15 DIAGNOSIS — M5440 Lumbago with sciatica, unspecified side: Secondary | ICD-10-CM

## 2017-10-15 DIAGNOSIS — F419 Anxiety disorder, unspecified: Secondary | ICD-10-CM

## 2017-10-15 DIAGNOSIS — G8929 Other chronic pain: Secondary | ICD-10-CM

## 2017-10-15 NOTE — Telephone Encounter (Signed)
I spoke with Tonya Bradley.  We scheduled surgery for January 14th.  All surgery information given and confirmation letter mailed to her home address.

## 2017-11-02 ENCOUNTER — Other Ambulatory Visit: Payer: Self-pay | Admitting: Family Medicine

## 2017-11-02 DIAGNOSIS — K2100 Gastro-esophageal reflux disease with esophagitis, without bleeding: Secondary | ICD-10-CM

## 2017-11-06 ENCOUNTER — Ambulatory Visit (INDEPENDENT_AMBULATORY_CARE_PROVIDER_SITE_OTHER): Payer: Medicare HMO | Admitting: Podiatry

## 2017-11-06 ENCOUNTER — Encounter: Payer: Self-pay | Admitting: Podiatry

## 2017-11-06 VITALS — BP 140/66 | HR 89 | Ht 62.0 in | Wt 170.0 lb

## 2017-11-06 DIAGNOSIS — L600 Ingrowing nail: Secondary | ICD-10-CM

## 2017-11-06 DIAGNOSIS — M79671 Pain in right foot: Secondary | ICD-10-CM | POA: Diagnosis not present

## 2017-11-06 DIAGNOSIS — M79672 Pain in left foot: Secondary | ICD-10-CM | POA: Diagnosis not present

## 2017-11-06 DIAGNOSIS — B351 Tinea unguium: Secondary | ICD-10-CM

## 2017-11-06 NOTE — Progress Notes (Signed)
Ingrown nails on both big toe nails. Patient gets swollen foot. SUBJECTIVE: 66 y.o. year old female presents complaining of painful nails on both great toes. Has been having swollen foot for many years.  Review of Systems  Constitutional: Negative.   HENT: Positive for sinus pain.   Eyes: Positive for blurred vision.       Scheduled for cataract surgery near future.  Respiratory: Positive for shortness of breath.        COPD.   Cardiovascular: Positive for leg swelling.       Congestive Heart failure x 8 years ago.  Gastrointestinal: Negative.   Genitourinary: Negative.   Musculoskeletal: Positive for back pain.       Scheduled for lower back surgery near future.  Skin: Negative.   Neurological: Negative.    OBJECTIVE: DERMATOLOGIC EXAMINATION: Thick dystrophic nails x 10. Painful ingrown nails on both great toes. No skin damage noted.  VASCULAR EXAMINATION OF LOWER LIMBS: Pedal pulses are palpable on DP and not palpable on PT due to swollen ankle.  Non pitting lower limb edema bilateral. Temperature gradient from tibial crest to dorsum of foot is within normal bilateral.  NEUROLOGIC EXAMINATION OF THE LOWER LIMBS: All epicritic and tactile sensations grossly intact bilateral. Sharp and Dull discriminatory sensations at the plantar ball of hallux is intact bilateral.   MUSCULOSKELETAL EXAMINATION: No gross deformities noted.  ASSESSMENT: Onychomycosis x 10 Painful ingrown hallucal nails bilateral.  PLAN: Reviewed findings and available treatment options. All nails debrided. May return in 4 months or sooner if needed.

## 2017-11-06 NOTE — Patient Instructions (Signed)
Seen for hypertrophic nails. All nails debrided. Return in 3 months or as needed.  

## 2017-11-07 ENCOUNTER — Other Ambulatory Visit: Payer: Self-pay | Admitting: Family Medicine

## 2017-11-07 DIAGNOSIS — F419 Anxiety disorder, unspecified: Secondary | ICD-10-CM

## 2017-11-10 ENCOUNTER — Ambulatory Visit: Payer: Medicare HMO | Admitting: Family Medicine

## 2017-11-13 ENCOUNTER — Telehealth: Payer: Self-pay | Admitting: Family Medicine

## 2017-11-13 NOTE — Telephone Encounter (Signed)
Copied from [place] [id number]. Topic: Quick Communication - Rx Refill/Question >> Nov 13, 2017 10:57 AM Yvette Rack wrote: Has the patient contacted their pharmacy? Yes.  Pharmacy states that they have faxed several forms to the practice   (Agent: If no, request that the patient contact the pharmacy for the refill.) ALPRAZolam Duanne Moron) 0.5 MG tablet    Preferred Pharmacy (with phone number or street name): [facility] [phone] - [place], [address] AT [address] [phone] (Phone)    Agent: Please be advised that RX refills may take up to 3 business days. We ask that you follow-up with your pharmacy.

## 2017-11-14 NOTE — Telephone Encounter (Signed)
Rx for Alprazolam was already sent. BJ

## 2017-11-14 NOTE — Telephone Encounter (Signed)
REQUEST  XANAX  REFILL

## 2017-11-17 NOTE — Progress Notes (Signed)
HPI:   [name] is a 66 y.o. female, who is here today with her husband for 2 months follow up.   She was last seen on 09/10/2017, since her last visit she has follow-up with ophthalmologist and planning on cataract surgery. She has also follow with orthopedist, history of lumbar spinal stenosis of lumbar.  Anxiety Currently she is on Xanax 0.5 mg twice daily as needed. Cymbalta 60 mg daily. She denies depressed mood or suicidal thoughts.  Insomnia: Currently she is on Trazodone 50 mg as needed at bedtime. Sleeping about 8-9 hours, sleeping better. Tolerating well with no side effects.  Constipation: Last office visit, she was c/o lower abdominal pain and constipation. Linzess 145 mcg was started, she takes it prn because diarrhea. She takes it every 3 days, if she does not take medication she will have a small and hard stool in 4-5 days. Abdominal pain has improved.  She has not noted blood in stool or melena.  Hx of GERD, Protonix does not seem to be helping. Nausea and heartburn, exacerbated by food intake, usually after dinner. She has not had vomiting.  Hx of esophagitis and s/p esophageal dilation.  COPD: Currently she is on Symbicort 80-4.5 mcg twice daily, which was started in October 2018. Still having nocturnal wheezing, per husband report. Improved.  She is using Albuterol inhaler "very seldom."  Still smoking but less cig/day.   Review of Systems  Constitutional: Negative for activity change, appetite change, fatigue and fever.  HENT: Negative for mouth sores, nosebleeds, sore throat and trouble swallowing.   Eyes: Negative for redness and visual disturbance.  Respiratory: Positive for cough (improved) and wheezing. Negative for shortness of breath.   Cardiovascular: Negative for chest pain, palpitations and leg swelling.  Gastrointestinal: Positive for abdominal pain, constipation and diarrhea. Negative for nausea and vomiting.   Negative for changes in bowel habits.  Endocrine: Negative for cold intolerance and heat intolerance.  Genitourinary: Negative for decreased urine volume, dysuria and hematuria.  Musculoskeletal: Positive for back pain. Negative for gait problem and myalgias.  Skin: Negative for pallor and rash.  Allergic/Immunologic: Positive for environmental allergies.  Neurological: Negative for syncope, weakness and headaches.  Psychiatric/Behavioral: Positive for sleep disturbance. Negative for confusion. The patient is nervous/anxious.       Current Outpatient Medications on File Prior to Visit  Medication Sig Dispense Refill  . ALPRAZolam (XANAX) 0.5 MG tablet Take 1 tablet (0.5 mg total) by mouth 2 (two) times daily as needed for anxiety (no more than 40 tabs per month.). 40 tablet 2  . amiodarone (PACERONE) 200 MG tablet Take 1/2 tablet (100 mg) by mouth once daily 90 tablet 3  . aspirin EC 81 MG tablet Take 81 mg by mouth daily.    Marland Kitchen atorvastatin (LIPITOR) 80 MG tablet Take 80 mg by mouth daily.    Marland Kitchen BESIVANCE 0.6 % SUSP INSTILL ONE DROP INTO THE LEFT EYE TID  1  . Cholecalciferol (VITAMIN D) 2000 units CAPS Take 2,000 Units by mouth daily.    . DULoxetine (CYMBALTA) 60 MG capsule TAKE 1 CAPSULE (60 MG TOTAL) BY MOUTH DAILY. 90 capsule 1  . DUREZOL 0.05 % EMUL INSTILL ONE DROP INTO THE LEFT EYE TID  1  . furosemide (LASIX) 20 MG tablet TAKE 1 TABLET(20 MG) BY MOUTH TWICE DAILY 60 tablet 2  . Multiple Vitamin (MULTIVITAMIN) tablet Take 1 tablet by mouth daily.    . nitroGLYCERIN (NITROSTAT) 0.4 MG SL  tablet Place 0.4 mg under the tongue every 5 (five) minutes as needed for chest pain. Reported on 06/12/2016    . pantoprazole (PROTONIX) 40 MG tablet TAKE 1 TABLET EVERY DAY 90 tablet 0  . rOPINIRole (REQUIP) 0.25 MG tablet Take 0.75 mg by mouth at bedtime.    . traZODone (DESYREL) 50 MG tablet TAKE 1 TABLET (50 MG TOTAL) BY MOUTH AT BEDTIME AS NEEDED FOR SLEEP. 90 tablet 1  . VENTOLIN HFA 108 (90  Base) MCG/ACT inhaler INHALE 2 PUFFS INTO THE LUNGS EVERY 6 HOURS AS NEEDED FOR WHEEZING OR SHORTNESS OF BREATH 18 g 0   No current facility-administered medications on file prior to visit.      Past Medical History:  Diagnosis Date  . Anemia   . Anxiety   . Atrial flutter ([place]) 03/13/2016  . CAD (coronary artery disease)   . Cancer ([place])    skin cancer  . Cataract   . Colon polyp   . COPD (chronic obstructive pulmonary disease) ([place])   . Depression   . Emphysema of lung ([place])   . GERD (gastroesophageal reflux disease)   . Hypercholesterolemia   . Osteoporosis   . Oxygen deficiency    No Known Allergies  Social History   Socioeconomic History  . Marital status: Married    Spouse name: None  . Number of children: 3  . Years of education: 10  . Highest education level: None  Social Needs  . Financial resource strain: None  . Food insecurity - worry: None  . Food insecurity - inability: None  . Transportation needs - medical: None  . Transportation needs - non-medical: None  Occupational History  . 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 Smoking status: Current Every Day Smoker    Packs/day: 1.50    Years: 45.00    Pack years: 67.50    Types: Cigarettes    Start date: 06/14/1966  . Smokeless tobacco: Never Used  . Tobacco comment: 1-2 packs daily  Substance and Sexual Activity  . Alcohol use: No    Alcohol/week: 0.0 oz  . Drug use: No  . Sexual activity: Yes    Birth control/protection: Post-menopausal  Other Topics Concern  . None  Social History Narrative   Quit high school and got married in 10th grade, age 8   First husband died of colon cancer at young age   Married again 2018   Lives in own home   Daughter lives with her   Drinks coffee seldom, too much "sweet tea"    Vitals:   11/18/17 1027  BP: 128/76  Pulse: 83  Resp: 16  Temp: 98.4 F (36.9 C)  SpO2: 96%   Body mass index is 30.59  kg/m.   Physical Exam  Nursing note and vitals reviewed. Constitutional: She is oriented to person, place, and time. She appears well-developed. No distress.  HENT:  Head: Normocephalic and atraumatic.  Mouth/Throat: Oropharynx is clear and moist and mucous membranes are normal.  Eyes: Conjunctivae are normal. Pupils are equal, round, and reactive to light.  Cardiovascular: Normal rate and regular rhythm.  No murmur heard. Pulses:      Dorsalis pedis pulses are 2+ on the right side, and 2+ on the left side.  Respiratory: Effort normal and breath sounds normal. No respiratory distress.  GI: Soft. Bowel sounds are normal. She exhibits no mass. There is no hepatomegaly. There is no tenderness.  Musculoskeletal: She  exhibits edema (2+ pitting edema LE,bilateral.). She exhibits no tenderness.  Lymphadenopathy:    She has no cervical adenopathy.  Neurological: She is alert and oriented to person, place, and time. She has normal strength. Gait normal.  Skin: Skin is warm. No rash noted. No erythema.  Psychiatric: Her mood appears anxious.  Well groomed, good eye contact.    ASSESSMENT AND PLAN:   [name] was seen today for 2 months follow-up.   Diagnoses and all orders for this visit:  Constipation, unspecified constipation type  She agrees with trying lower dose of Linzess. Side effects discussed. Adequate fiber and fluid intake. Instructed about warning signs. F/U in 3 months.  -     linaclotide (LINZESS) 72 MCG capsule; Take 1 capsule (72 mcg total) by mouth daily before breakfast.  Pulmonary emphysema, unspecified emphysema type ([place])  Improved but not well controlled. Encouraged to continue working on smoking cessation. Symbicort increased from 80-4.5 to 160-4.5 mcg bid. No changes in Albuterol inh. Educated about Dx and prognosis. F/U in 2-3 months.  -     budesonide-formoterol (SYMBICORT) 160-4.5 MCG/ACT inhaler; Inhale 2 puffs into the lungs 2 (two)  times daily.  Gastroesophageal reflux disease, esophagitis presence not specified  Not well controlled. GERD precautions discussed. Protonix stopped. Dexilant 30 mg started. Instructed about warning signs. F/U in 3 months.  -     Dexlansoprazole (DEXILANT) 30 MG capsule; Take 1 capsule (30 mg total) by mouth daily.  Chronic anxiety  Stable. No changes in Cymbalta or Alprazolam. Side effects of meds discussed. F/U in 3 months.   Insomnia, unspecified type  Improved. No changes in current management. Good sleep hygiene also recommended. F/U in 4-6 months.   -[name] was advised to return sooner than planned today if new concerns arise.       [name]. Martinique, MD  [facility]. [place] office.

## 2017-11-18 ENCOUNTER — Encounter: Payer: Self-pay | Admitting: Family Medicine

## 2017-11-18 ENCOUNTER — Ambulatory Visit (INDEPENDENT_AMBULATORY_CARE_PROVIDER_SITE_OTHER): Payer: Medicare HMO | Admitting: Family Medicine

## 2017-11-18 VITALS — BP 128/76 | HR 83 | Temp 98.4°F | Resp 16 | Ht 62.0 in | Wt 167.2 lb

## 2017-11-18 DIAGNOSIS — J439 Emphysema, unspecified: Secondary | ICD-10-CM | POA: Diagnosis not present

## 2017-11-18 DIAGNOSIS — F419 Anxiety disorder, unspecified: Secondary | ICD-10-CM

## 2017-11-18 DIAGNOSIS — G4700 Insomnia, unspecified: Secondary | ICD-10-CM

## 2017-11-18 DIAGNOSIS — K219 Gastro-esophageal reflux disease without esophagitis: Secondary | ICD-10-CM | POA: Diagnosis not present

## 2017-11-18 DIAGNOSIS — K5900 Constipation, unspecified: Secondary | ICD-10-CM

## 2017-11-18 MED ORDER — LINACLOTIDE 72 MCG PO CAPS: 72.0000 ug | ORAL_CAPSULE | Freq: Every day | ORAL | 1 refills | Status: DC

## 2017-11-18 MED ORDER — DEXLANSOPRAZOLE 30 MG PO CPDR: 30.0000 mg | DELAYED_RELEASE_CAPSULE | Freq: Every day | ORAL | 1 refills | Status: DC

## 2017-11-18 MED ORDER — BUDESONIDE-FORMOTEROL FUMARATE 160-4.5 MCG/ACT IN AERO: 2.0000 | INHALATION_SPRAY | Freq: Two times a day (BID) | RESPIRATORY_TRACT | 3 refills | Status: DC

## 2017-11-18 NOTE — Patient Instructions (Signed)
A few things to remember from today's visit:   Constipation, unspecified constipation type  Pulmonary emphysema, unspecified emphysema type (HCC)  Chronic anxiety  Gastroesophageal reflux disease, esophagitis presence not specified Symbicort increased to 160-4.5 mcg daily.  Stop Protonix and start Dexilant 30 mg.  GERD:  Avoid foods that make your symptoms worse, for example coffee, chocolate,pepermeint,alcohol, and greasy food. Raising the head of your bed about 6 inches may help with nocturnal symptoms.  Avoid tobacco use. Weight loss (if you are overweight). Avoid lying down for 3 hours after eating.  Instead 3 large meals daily try small and more frequent meals during the day.   You should be evaluated immediately if bloody vomiting, bloody stools, black stools (like tar), difficulty swallowing, food gets stuck on the way down or choking when eating. Abnormal weight loss or severe abdominal pain.  If symptoms are not resolved sometimes endoscopy is necessary.  Please be sure medication list is accurate. If a new problem present, please set up appointment sooner than planned today.

## 2017-11-24 ENCOUNTER — Other Ambulatory Visit: Payer: Self-pay | Admitting: *Deleted

## 2017-11-24 DIAGNOSIS — G4700 Insomnia, unspecified: Secondary | ICD-10-CM

## 2017-11-24 MED ORDER — ROPINIROLE HCL 0.25 MG PO TABS: 0.7500 mg | ORAL_TABLET | Freq: Every day | ORAL | 1 refills | Status: DC

## 2017-11-24 MED ORDER — TRAZODONE HCL 50 MG PO TABS: 50.0000 mg | ORAL_TABLET | Freq: Every evening | ORAL | 1 refills | Status: DC | PRN

## 2017-11-24 MED ORDER — ATORVASTATIN CALCIUM 80 MG PO TABS: 80.0000 mg | ORAL_TABLET | Freq: Every day | ORAL | 1 refills | Status: DC

## 2017-11-27 DIAGNOSIS — Z85828 Personal history of other malignant neoplasm of skin: Secondary | ICD-10-CM | POA: Diagnosis not present

## 2017-11-27 DIAGNOSIS — D225 Melanocytic nevi of trunk: Secondary | ICD-10-CM | POA: Diagnosis not present

## 2017-11-27 DIAGNOSIS — D1801 Hemangioma of skin and subcutaneous tissue: Secondary | ICD-10-CM | POA: Diagnosis not present

## 2017-11-27 DIAGNOSIS — L570 Actinic keratosis: Secondary | ICD-10-CM | POA: Diagnosis not present

## 2017-11-27 DIAGNOSIS — L814 Other melanin hyperpigmentation: Secondary | ICD-10-CM | POA: Diagnosis not present

## 2017-11-27 DIAGNOSIS — L821 Other seborrheic keratosis: Secondary | ICD-10-CM | POA: Diagnosis not present

## 2017-12-01 DIAGNOSIS — H2511 Age-related nuclear cataract, right eye: Secondary | ICD-10-CM | POA: Diagnosis not present

## 2017-12-01 DIAGNOSIS — H2512 Age-related nuclear cataract, left eye: Secondary | ICD-10-CM | POA: Diagnosis not present

## 2017-12-09 ENCOUNTER — Telehealth: Payer: Self-pay | Admitting: Family Medicine

## 2017-12-09 NOTE — Telephone Encounter (Signed)
Copied from [place] [id number]. Topic: Quick Communication - See Telephone Encounter >> Dec 09, 2017  1:37 PM Oneta Rack wrote: CRM for notification. See Telephone encounter for:   12/09/17.  Relation to pt: self Call back number: [phone]    Reason for call:  Patient last seen 11/18/17 for constipation, patient states she stopped linaclotide (LINZESS) 72 MCG capsule a week and 1/2 ago due to her thinking the medication was the cause of her diarrhea, diarrhea has been ongoing after d/c medication patient seeking clinical advice regarding diarrhea, please advise

## 2017-12-09 NOTE — Telephone Encounter (Signed)
This must have been routed to me in error.

## 2017-12-09 NOTE — Telephone Encounter (Signed)
Spoke with patient. Patient stated that she would try imodium and then if that didn't help she would come in for an appointment.

## 2017-12-10 ENCOUNTER — Ambulatory Visit (INDEPENDENT_AMBULATORY_CARE_PROVIDER_SITE_OTHER): Payer: Medicare HMO | Admitting: Internal Medicine

## 2017-12-10 ENCOUNTER — Encounter: Payer: Self-pay | Admitting: Internal Medicine

## 2017-12-10 VITALS — BP 116/50 | HR 84 | Resp 16 | Ht 62.0 in | Wt 165.6 lb

## 2017-12-10 DIAGNOSIS — I4892 Unspecified atrial flutter: Secondary | ICD-10-CM

## 2017-12-10 DIAGNOSIS — R7981 Abnormal blood-gas level: Secondary | ICD-10-CM | POA: Diagnosis not present

## 2017-12-10 MED ORDER — AMIODARONE HCL 200 MG PO TABS: ORAL_TABLET | ORAL | 3 refills | Status: DC

## 2017-12-10 NOTE — Progress Notes (Signed)
HPI [name] returns today for followup. She is a pleasant 67 yo woman with a h/o atrial flutter , HTN and COPD who continues to smoke. The patient has a h/o atrial flutter and underwent ablation but also was found to have atrial fib and was placed on low dose amiodarone. She has had minimal if any palpitations. No edema. On arrival today, she was dyspneic with walking and was noted to have an oxygen saturation of 82% on room air. With rest this normalized. She has a chronic cough. She admits to dietary indiscretion.  No Known Allergies   Current Outpatient Medications  Medication Sig Dispense Refill  . ALPRAZolam (XANAX) 0.5 MG tablet Take 1 tablet (0.5 mg total) by mouth 2 (two) times daily as needed for anxiety (no more than 40 tabs per month.). 40 tablet 2  . amiodarone (PACERONE) 200 MG tablet Take 1/2 tablet (100 mg) by mouth once daily 90 tablet 3  . aspirin EC 81 MG tablet Take 81 mg by mouth daily.    Marland Kitchen atorvastatin (LIPITOR) 80 MG tablet Take 1 tablet (80 mg total) by mouth daily. 90 tablet 1  . BESIVANCE 0.6 % SUSP INSTILL ONE DROP INTO THE LEFT EYE TID  1  . budesonide-formoterol (SYMBICORT) 160-4.5 MCG/ACT inhaler Inhale 2 puffs into the lungs 2 (two) times daily. 1 Inhaler 3  . Cholecalciferol (VITAMIN D) 2000 units CAPS Take 2,000 Units by mouth daily.    Marland Kitchen Dexlansoprazole (DEXILANT) 30 MG capsule Take 1 capsule (30 mg total) by mouth daily. 30 capsule 1  . DULoxetine (CYMBALTA) 60 MG capsule TAKE 1 CAPSULE (60 MG TOTAL) BY MOUTH DAILY. 90 capsule 1  . DUREZOL 0.05 % EMUL INSTILL ONE DROP INTO THE LEFT EYE TID  1  . furosemide (LASIX) 20 MG tablet TAKE 1 TABLET(20 MG) BY MOUTH TWICE DAILY 60 tablet 2  . linaclotide (LINZESS) 72 MCG capsule Take 1 capsule (72 mcg total) by mouth daily before breakfast. 30 capsule 1  . Multiple Vitamin (MULTIVITAMIN) tablet Take 1 tablet by mouth daily.    . nitroGLYCERIN (NITROSTAT) 0.4 MG SL tablet Place 0.4 mg under the tongue every 5  (five) minutes as needed for chest pain. Reported on 06/12/2016    . pantoprazole (PROTONIX) 40 MG tablet TAKE 1 TABLET EVERY DAY 90 tablet 0  . rOPINIRole (REQUIP) 0.25 MG tablet Take 3 tablets (0.75 mg total) by mouth at bedtime. 90 tablet 1  . traZODone (DESYREL) 50 MG tablet Take 1 tablet (50 mg total) by mouth at bedtime as needed for sleep. 90 tablet 1  . VENTOLIN HFA 108 (90 Base) MCG/ACT inhaler INHALE 2 PUFFS INTO THE LUNGS EVERY 6 HOURS AS NEEDED FOR WHEEZING OR SHORTNESS OF BREATH 18 g 0   No current facility-administered medications for this visit.      Past Medical History:  Diagnosis Date  . Anemia   . Anxiety   . Atrial flutter ([place]) 03/13/2016  . CAD (coronary artery disease)   . Cancer ([place])    skin cancer  . Cataract   . Colon polyp   . COPD (chronic obstructive pulmonary disease) ([place])   . Depression   . Emphysema of lung ([place])   . GERD (gastroesophageal reflux disease)   . Hypercholesterolemia   . Osteoporosis   . Oxygen deficiency     ROS:   All systems reviewed and negative except as noted in the HPI.   Past Surgical History:  Procedure Laterality  Date  . A-FLUTTER ABLATION N/A 05/19/2017   Procedure: A-Flutter Ablation;  Surgeon: Evans Lance, MD;  Location: [facility];  Service: Cardiovascular;  Laterality: N/A;  . APPENDECTOMY    . BASAL CELL CARCINOMA EXCISION    . CARDIAC CATHETERIZATION    . CHOLECYSTECTOMY    . COLONOSCOPY N/A 06/23/2014   Dr. Rourk:Rectal and colonic polyps-removed, Pancolonic diverticulosis. lymphocytic colitis  . CORONARY ANGIOPLASTY WITH STENT PLACEMENT    . ESOPHAGEAL DILATION N/A 03/13/2016   Procedure: ESOPHAGEAL DILATION;  Surgeon: Daneil Dolin, MD;  [facility];  Service: Endoscopy;  Laterality: N/A;  . ESOPHAGOGASTRODUODENOSCOPY N/A 03/13/2016   Procedure: ESOPHAGOGASTRODUODENOSCOPY (EGD);  Surgeon: Daneil Dolin, MD;  Location: [facility];  Service: Endoscopy;  Laterality: N/A;  0930      Family History  Problem Relation Age of Onset  . Breast cancer Sister 90  . Heart disease Sister   . Heart attack Father   . Heart disease Father   . Stroke Mother   . Heart disease Mother   . Hyperlipidemia Mother   . Heart attack Sister   . Heart disease Brother        ?heart failure  . Congestive Heart Failure Brother   . Hypertension Son   . Heart disease Maternal Grandfather   . Cancer Cousin   . Hypertension Son   . Colon cancer Neg Hx      Social History   Socioeconomic History  . Marital status: Married    Spouse name: Not on file  . Number of children: 3  . Years of education: 10  . Highest education level: Not on file  Social Needs  . Financial resource strain: Not on file  . Food insecurity - worry: Not on file  . Food insecurity - inability: Not on file  . Transportation needs - medical: Not on file  . Transportation needs - non-medical: Not on file  Occupational History  . 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 Smoking status: Current Every Day Smoker    Packs/day: 1.50    Years: 45.00    Pack years: 67.50    Types: Cigarettes    Start date: 06/14/1966  . Smokeless tobacco: Never Used  . Tobacco comment: 1-2 packs daily  Substance and Sexual Activity  . Alcohol use: No    Alcohol/week: 0.0 oz  . Drug use: No  . Sexual activity: Yes    Birth control/protection: Post-menopausal  Other Topics Concern  . Not on file  Social History Narrative   Quit high school and got married in 10th grade, age 52   First husband died of colon cancer at young age   Married again 2018   Lives in own home   Daughter lives with her   Drinks coffee seldom, too much "sweet tea"     BP (!) 116/50   Pulse 84   Resp 16   Ht 5\' 2"  (1.575 m)   Wt 165 lb 9.6 oz (75.1 kg)   SpO2 90%   BMI 30.29 kg/m   Physical Exam:  stable appearing 67 yo woman, NAD HEENT: Unremarkable Neck:  7 cm JVD, no thyromegally Lymphatics:   No adenopathy Back:  No CVA tenderness Lungs:  Clear with no wheezes HEART:  Regular rate rhythm, no murmurs, no rubs, no clicks Abd:  soft, positive bowel sounds, no organomegally, no rebound, no guarding Ext:  2 plus pulses,  no edema, no cyanosis, no clubbing Skin:  No rashes no nodules Neuro:  CN II through XII intact, motor grossly intact  EKG - NSR with right superior axis, and RVH   Assess/Plan: 1. Atrial fib - she is maintaining NSR on very low dose amiodarone. Will follow. 2. Atrial flutter - she is s/p ablation and has had no flutter 3. Copd - she desaturates on room air with walking. She probably needs home oxygen. I have asked her to stop smoking and recommended she followup with one of our pulmonologists.  4. Tobacco abuse - I have strongly encouraged the patient to stop smoking. She is currently unwilling to even consider electronic cigs.   Mikle Bosworth.D.

## 2017-12-10 NOTE — Patient Instructions (Addendum)
Medication Instructions:  Your physician recommends that you continue on your current medications as directed. Please refer to the Current Medication list given to you today.  Labwork: None ordered.  Testing/Procedures: None ordered.  Follow-Up: Your physician wants you to follow-up in: one year with Dr. Lovena Le.   You will receive a reminder letter in the mail two months in advance. If you don't receive a letter, please call our office to schedule the follow-up appointment.   Any Other Special Instructions Will Be Listed Below (If Applicable).  We will refer you to Dr. Lamonte Sakai and associates to evaluate you for possible COPD and need for home oxygen for exertional desaturation.  If you need a refill on your cardiac medications before your next appointment, please call your pharmacy.

## 2017-12-10 NOTE — Telephone Encounter (Signed)
Adequate hydration, small and frequent sips to avoid dehydration. I do not recommend any OTC medication to stop diarrhea for now, usually it resolves spontaneously but may take a few days to a couple of weeks. If worse she needs to arrange appt for acute visit.  Thanks, BJ

## 2017-12-15 ENCOUNTER — Inpatient Hospital Stay (HOSPITAL_COMMUNITY): Admission: RE | Admit: 2017-12-15 | Payer: Medicare HMO | Source: Ambulatory Visit | Admitting: Orthopaedic Surgery

## 2017-12-15 ENCOUNTER — Encounter (HOSPITAL_COMMUNITY): Admission: RE | Payer: Self-pay | Source: Ambulatory Visit

## 2017-12-15 DIAGNOSIS — H2511 Age-related nuclear cataract, right eye: Secondary | ICD-10-CM | POA: Diagnosis not present

## 2017-12-15 SURGERY — LUMBAR LAMINECTOMY/DECOMPRESSION MICRODISCECTOMY
Anesthesia: General

## 2017-12-16 ENCOUNTER — Encounter: Payer: Self-pay | Admitting: Family Medicine

## 2017-12-16 ENCOUNTER — Ambulatory Visit (INDEPENDENT_AMBULATORY_CARE_PROVIDER_SITE_OTHER): Payer: Medicare HMO | Admitting: Family Medicine

## 2017-12-16 VITALS — BP 120/70 | HR 88 | Temp 98.2°F | Resp 16 | Ht 62.0 in | Wt 169.4 lb

## 2017-12-16 DIAGNOSIS — R197 Diarrhea, unspecified: Secondary | ICD-10-CM | POA: Diagnosis not present

## 2017-12-16 DIAGNOSIS — Z720 Tobacco use: Secondary | ICD-10-CM

## 2017-12-16 DIAGNOSIS — R194 Change in bowel habit: Secondary | ICD-10-CM | POA: Diagnosis not present

## 2017-12-16 LAB — TSH: TSH: 2.59 u[IU]/mL (ref 0.35–4.50)

## 2017-12-16 LAB — CBC WITH DIFFERENTIAL/PLATELET
BASOS PCT: 0.5 % (ref 0.0–3.0)
Basophils Absolute: 0 10*3/uL (ref 0.0–0.1)
EOS ABS: 0.1 10*3/uL (ref 0.0–0.7)
Eosinophils Relative: 1.1 % (ref 0.0–5.0)
HEMATOCRIT: 39.4 % (ref 36.0–46.0)
Hemoglobin: 13.2 g/dL (ref 12.0–15.0)
LYMPHS PCT: 21.6 % (ref 12.0–46.0)
Lymphs Abs: 2 10*3/uL (ref 0.7–4.0)
MCHC: 33.6 g/dL (ref 30.0–36.0)
MCV: 92 fl (ref 78.0–100.0)
Monocytes Absolute: 0.7 10*3/uL (ref 0.1–1.0)
Monocytes Relative: 8.1 % (ref 3.0–12.0)
NEUTROS ABS: 6.3 10*3/uL (ref 1.4–7.7)
Neutrophils Relative %: 68.7 % (ref 43.0–77.0)
PLATELETS: 331 10*3/uL (ref 150.0–400.0)
RBC: 4.28 Mil/uL (ref 3.87–5.11)
RDW: 15.4 % (ref 11.5–15.5)
WBC: 9.2 10*3/uL (ref 4.0–10.5)

## 2017-12-16 LAB — BASIC METABOLIC PANEL
BUN: 11 mg/dL (ref 6–23)
CALCIUM: 8.8 mg/dL (ref 8.4–10.5)
CO2: 37 mEq/L — ABNORMAL HIGH (ref 19–32)
Chloride: 101 mEq/L (ref 96–112)
Creatinine, Ser: 0.68 mg/dL (ref 0.40–1.20)
GFR: 91.49 mL/min (ref >60.00)
GLUCOSE: 109 mg/dL — AB (ref 70–99)
Potassium: 3.4 mEq/L — ABNORMAL LOW (ref 3.5–5.1)
SODIUM: 143 meq/L (ref 135–145)

## 2017-12-16 NOTE — Progress Notes (Signed)
HPI:  Chief Complaint  Patient presents with  . Diarrhea    5 to 6 times a day for 3 weeks    Tonya Bradley is a 67 y.o. female, who is here today with her husband complaining of persistent watery stools.  She has Hx of constipation. OTC medication were not helping,so Linzess started on 09/10/17. Because diarrhea, dose was decreased to 72 mcg. Diarrhea did not improved,so she discontinue Linzess 72 mcg about 2 weeks ago. She feels like diarrhea got worse after she discontinue Linzess and has been stable sine then.  She can have from 2-6 watery stools per day. Yesterday she had an episode of fecal incontinence, mild. She has had 2 stools today, yesterday she had 3 stools. She denies associated fever, chills, fatigue, or body aches.  She has not had antibiotic treatment in the past 3 months, no recent travel, and no sick contact.  Diarrhea is exacerbated by food intake, so she has avoided eating. She has not identified alleviating factors, she try OTC Imodium but did not help.  Denies associated abdominal pain, nausea, vomiting,blood in stool or melena.  Denies dysuria,increased urinary frequency, gross hematuria,or decreased urine output.  S/P cholecystectomy.  Last colonoscopy on 06/23/2014, according to records indication for procedure was chronic diarrhea. Rectal and colonic polyps-removed as described above. Pancolonic diverticulosis.  CT of abdomen/pelvis with contrast on 08/18/2014, which was also performed due to weight loss and chronic diarrhea.  No mass or adenopathy is seen in the abdomen or pelvis. There is sigmoid diverticulosis without diverticulitis. Mild wall thickening in the sigmoid colon is felt to be due to muscular hypertrophy from chronic diverticulosis. No mesenteric inflammation or abscess.  No bowel obstruction. Gallbladder absent.   She has no noted skin erythema, hot flashes, or diaphoresis.  + Smoker.  CT chest lung cancer  screening 04/2017:  1. Lung-RADS Category 1, negative. Continue annual screening with low-dose chest CT without contrast in 12 months. 2. Coronary artery atherosclerosis.  History of anxiety, currently she is on Cymbalta. Insomnia on Trazodone. Problems are well controlled with current medication, she has no noted side effects.   Review of Systems  Constitutional: Negative for activity change, appetite change, fatigue and fever.  HENT: Negative for mouth sores, sore throat and trouble swallowing.   Respiratory: Positive for cough and wheezing. Negative for shortness of breath.        COPD with symptoms at her baseline.  Cardiovascular: Positive for leg swelling (Improved.).  Gastrointestinal: Positive for diarrhea. Negative for abdominal distention, abdominal pain, blood in stool, nausea and vomiting.  Endocrine: Negative for cold intolerance and heat intolerance.  Genitourinary: Negative for decreased urine volume, dysuria, frequency, hematuria and pelvic pain.  Musculoskeletal: Negative for gait problem and myalgias.  Skin: Negative for pallor and rash.  Allergic/Immunologic: Negative for food allergies.  Neurological: Negative for syncope, weakness and headaches.  Hematological: Negative for adenopathy. Does not bruise/bleed easily.  Psychiatric/Behavioral: Negative for confusion. The patient is nervous/anxious.      Current Outpatient Medications on File Prior to Visit  Medication Sig Dispense Refill  . ALPRAZolam (XANAX) 0.5 MG tablet Take 1 tablet (0.5 mg total) by mouth 2 (two) times daily as needed for anxiety (no more than 40 tabs per month.). 40 tablet 2  . amiodarone (PACERONE) 200 MG tablet Take 1/2 tablet (100 mg) by mouth once daily 90 tablet 3  . aspirin EC 81 MG tablet Take 81 mg by mouth daily.    Marland Kitchen  atorvastatin (LIPITOR) 80 MG tablet Take 1 tablet (80 mg total) by mouth daily. 90 tablet 1  . BESIVANCE 0.6 % SUSP INSTILL ONE DROP INTO THE LEFT EYE TID  1  .  budesonide-formoterol (SYMBICORT) 160-4.5 MCG/ACT inhaler Inhale 2 puffs into the lungs 2 (two) times daily. 1 Inhaler 3  . Cholecalciferol (VITAMIN D) 2000 units CAPS Take 2,000 Units by mouth daily.    Marland Kitchen Dexlansoprazole (DEXILANT) 30 MG capsule Take 1 capsule (30 mg total) by mouth daily. 30 capsule 1  . DULoxetine (CYMBALTA) 60 MG capsule TAKE 1 CAPSULE (60 MG TOTAL) BY MOUTH DAILY. 90 capsule 1  . DUREZOL 0.05 % EMUL INSTILL ONE DROP INTO THE LEFT EYE TID  1  . furosemide (LASIX) 20 MG tablet TAKE 1 TABLET(20 MG) BY MOUTH TWICE DAILY 60 tablet 2  . Multiple Vitamin (MULTIVITAMIN) tablet Take 1 tablet by mouth daily.    . nitroGLYCERIN (NITROSTAT) 0.4 MG SL tablet Place 0.4 mg under the tongue every 5 (five) minutes as needed for chest pain. Reported on 06/12/2016    . pantoprazole (PROTONIX) 40 MG tablet TAKE 1 TABLET EVERY DAY 90 tablet 0  . rOPINIRole (REQUIP) 0.25 MG tablet Take 3 tablets (0.75 mg total) by mouth at bedtime. 90 tablet 1  . traZODone (DESYREL) 50 MG tablet Take 1 tablet (50 mg total) by mouth at bedtime as needed for sleep. 90 tablet 1  . VENTOLIN HFA 108 (90 Base) MCG/ACT inhaler INHALE 2 PUFFS INTO THE LUNGS EVERY 6 HOURS AS NEEDED FOR WHEEZING OR SHORTNESS OF BREATH 18 g 0  . linaclotide (LINZESS) 72 MCG capsule Take 1 capsule (72 mcg total) by mouth daily before breakfast. (Patient not taking: Reported on 12/16/2017) 30 capsule 1   No current facility-administered medications on file prior to visit.      Past Medical History:  Diagnosis Date  . Anemia   . Anxiety   . Atrial flutter ([place]) 03/13/2016  . CAD (coronary artery disease)   . Cancer ([place])    skin cancer  . Cataract   . Colon polyp   . COPD (chronic obstructive pulmonary disease) ([place])   . Depression   . Emphysema of lung ([place])   . GERD (gastroesophageal reflux disease)   . Hypercholesterolemia   . Osteoporosis   . Oxygen deficiency    No Known Allergies  Social History   Socioeconomic History    . Marital status: Married    Spouse name: None  . Number of children: 3  . Years of education: 10  . Highest education level: None  Social Needs  . Financial resource strain: None  . Food insecurity - worry: None  . Food insecurity - inability: None  . Transportation needs - medical: None  . Transportation needs - non-medical: None  Occupational History  . 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 Smoking status: Current Every Day Smoker    Packs/day: 1.50    Years: 45.00    Pack years: 67.50    Types: Cigarettes    Start date: 06/14/1966  . Smokeless tobacco: Never Used  . Tobacco comment: 1-2 packs daily  Substance and Sexual Activity  . Alcohol use: No    Alcohol/week: 0.0 oz  . Drug use: No  . Sexual activity: Yes    Birth control/protection: Post-menopausal  Other Topics Concern  . None  Social History Narrative   Quit high school and got married in 10th grade,  age 5   First husband died of colon cancer at young age   Married again 2018   Lives in own home   Daughter lives with her   Drinks coffee seldom, too much "sweet tea"    Vitals:   12/16/17 1218  BP: 120/70  Pulse: 88  Resp: 16  Temp: 98.2 F (36.8 C)  SpO2: 92%   Body mass index is 30.98 kg/m.   Physical Exam  Nursing note and vitals reviewed. Constitutional: She is oriented to person, place, and time. She appears well-developed. She does not appear ill. No distress.  HENT:  Head: Normocephalic and atraumatic.  Mouth/Throat: Oropharynx is clear and moist and mucous membranes are normal. She has dentures.  Eyes: Conjunctivae are normal. Pupils are equal, round, and reactive to light. No scleral icterus.  Neck: No tracheal deviation present. No thyroid mass and no thyromegaly present.  Cardiovascular: Normal rate and regular rhythm.  No murmur heard. Respiratory: Effort normal and breath sounds normal. No respiratory distress. She has no rhonchi. She has no  rales.  Prolonged expiration.  GI: Soft. She exhibits no distension and no mass. Bowel sounds are increased. There is no hepatomegaly. There is no tenderness.  Musculoskeletal: She exhibits edema (1+ pitting LE edema,bilateral). She exhibits no tenderness.  Lymphadenopathy:    She has no cervical adenopathy.       Right: No supraclavicular adenopathy present.       Left: No supraclavicular adenopathy present.  Neurological: She is alert and oriented to person, place, and time. She has normal strength.  Stable gait with no assistance.  Skin: Skin is warm. No rash noted. No erythema.  Psychiatric: She has a normal mood and affect.  Well groomed, good eye contact.     ASSESSMENT AND PLAN:   Tonya Bradley was seen today for persistent diarrhea.   Diagnoses and all orders for this visit:  Diarrhea, unspecified type  It seems like she has had work-up done for chronic diarrhea in 2015. ? IBS Other possible etiologies discussed. Adequate hydration. Daily Probiotic may help. Low residual diet recommended for now. Some of her medications could actually aggravate problem but she has been taking them for a few months now, so no changes for now.  Instructed about warning signs. Further recommendations will be given according to lab results.  -     CBC with Differential/Platelet -     TSH -     Basic metabolic panel -     C. difficile GDH and Toxin A/B -     Ova and parasite examination; Future -     Stool culture   Change in bowel habit  Recently treated for constipation. Colonoscopy in 06/2014, so for now I will obtain she needs GI evaluation/repeat colonoscopy.  -     CBC with Differential/Platelet -     TSH  Tobacco abuse  Adverse effects of tobacco use discussed, she was encouraged to quit smoking. According to patient, appointment with pulmonologist is being arranged through her cardiologist's office for COPD.      Kiyana Vazguez G. [place], MD  [facility]. [place] office.

## 2017-12-16 NOTE — Patient Instructions (Addendum)
A few things to remember from today's visit:   Diarrhea, unspecified type - Plan: CBC with Differential/Platelet, TSH, Basic metabolic panel, C. difficile GDH and Toxin A/B, Ova and parasite examination, Stool culture  Align 1 cap daily (Probiotic).  Food Choices to Help Relieve Diarrhea, Adult When you have diarrhea, the foods you eat and your eating habits are very important. Choosing the right foods and drinks can help:  Relieve diarrhea.  Replace lost fluids and nutrients.  Prevent dehydration.  What general guidelines should I follow? Relieving diarrhea  Choose foods with less than 2 g or .07 oz. of fiber per serving.  Limit fats to less than 8 tsp (38 g or 1.34 oz.) a day.  Avoid the following: ? Foods and beverages sweetened with high-fructose corn syrup, honey, or sugar alcohols such as xylitol, sorbitol, and mannitol. ? Foods that contain a lot of fat or sugar. ? Fried, greasy, or spicy foods. ? High-fiber grains, breads, and cereals. ? Raw fruits and vegetables.  Eat foods that are rich in probiotics. These foods include dairy products such as yogurt and fermented milk products. They help increase healthy bacteria in the stomach and intestines (gastrointestinal tract, or GI tract).  If you have lactose intolerance, avoid dairy products. These may make your diarrhea worse.  Take medicine to help stop diarrhea (antidiarrheal medicine) only as told by your health care provider. Replacing nutrients  Eat small meals or snacks every 3-4 hours.  Eat bland foods, such as white rice, toast, or baked potato, until your diarrhea starts to get better. Gradually reintroduce nutrient-rich foods as tolerated or as told by your health care provider. This includes: ? Well-cooked protein foods. ? Peeled, seeded, and soft-cooked fruits and vegetables. ? Low-fat dairy products.  Take vitamin and mineral supplements as told by your health care provider. Preventing  dehydration   Start by sipping water or a special solution to prevent dehydration (oral rehydration solution, ORS). Urine that is clear or pale yellow means that you are getting enough fluid.  Try to drink at least 8-10 cups of fluid each day to help replace lost fluids.  You may add other liquids in addition to water, such as clear juice or decaffeinated sports drinks, as tolerated or as told by your health care provider.  Avoid drinks with caffeine, such as coffee, tea, or soft drinks.  Avoid alcohol. What foods are recommended? The items listed may not be a complete list. Talk with your health care provider about what dietary choices are best for you. Grains White rice. White, [place], or pita breads (fresh or toasted), including plain rolls, buns, or bagels. White pasta. Saltine, soda, or graham crackers. Pretzels. Low-fiber cereal. Cooked cereals made with water (such as cornmeal, farina, or cream cereals). Plain muffins. Matzo. Melba toast. Zwieback. Vegetables Potatoes (without the skin). Most well-cooked and canned vegetables without skins or seeds. Tender lettuce. Fruits Apple sauce. Fruits canned in juice. Cooked apricots, cherries, grapefruit, peaches, pears, or plums. Fresh bananas and cantaloupe. Meats and other protein foods Baked or boiled chicken. Eggs. Tofu. Fish. Seafood. Smooth nut butters. Ground or well-cooked tender beef, ham, veal, lamb, pork, or poultry. Dairy Plain yogurt, kefir, and unsweetened liquid yogurt. Lactose-free milk, buttermilk, skim milk, or soy milk. Low-fat or nonfat hard cheese. Beverages Water. Low-calorie sports drinks. Fruit juices without pulp. Strained tomato and vegetable juices. Decaffeinated teas. Sugar-free beverages not sweetened with sugar alcohols. Oral rehydration solutions, if approved by your health care provider. Seasoning and other foods  Bouillon, broth, or soups made from recommended foods. What foods are not recommended? The  items listed may not be a complete list. Talk with your health care provider about what dietary choices are best for you. Grains Whole grain, whole wheat, bran, or rye breads, rolls, pastas, and crackers. Wild or brown rice. Whole grain or bran cereals. Barley. Oats and oatmeal. Corn tortillas or taco shells. Granola. Popcorn. Vegetables Raw vegetables. Fried vegetables. Cabbage, broccoli, Brussels sprouts, artichokes, baked beans, beet greens, corn, kale, legumes, peas, sweet potatoes, and yams. Potato skins. Cooked spinach and cabbage. Fruits Dried fruit, including raisins and dates. Raw fruits. Stewed or dried prunes. Canned fruits with syrup. Meat and other protein foods Fried or fatty meats. Deli meats. Chunky nut butters. Nuts and seeds. Beans and lentils. Berniece Salines. Hot dogs. Sausage. Dairy High-fat cheeses. Whole milk, chocolate milk, and beverages made with milk, such as milk shakes. Half-and-half. Cream. sour cream. Ice cream. Beverages Caffeinated beverages (such as coffee, tea, soda, or energy drinks). Alcoholic beverages. Fruit juices with pulp. Prune juice. Soft drinks sweetened with high-fructose corn syrup or sugar alcohols. High-calorie sports drinks. Fats and oils Butter. Cream sauces. Margarine. Salad oils. Plain salad dressings. Olives. Avocados. Mayonnaise. Sweets and desserts Sweet rolls, doughnuts, and sweet breads. Sugar-free desserts sweetened with sugar alcohols such as xylitol and sorbitol. Seasoning and other foods Honey. Hot sauce. Chili powder. Gravy. Cream-based or milk-based soups. Pancakes and waffles. Summary  When you have diarrhea, the foods you eat and your eating habits are very important.  Make sure you get at least 8-10 cups of fluid each day, or enough to keep your urine clear or pale yellow.  Eat bland foods and gradually reintroduce healthy, nutrient-rich foods as tolerated, or as told by your health care provider.  Avoid high-fiber, fried, greasy, or  spicy foods. This information is not intended to replace advice given to you by your health care provider. Make sure you discuss any questions you have with your health care provider. Document Released: 02/08/2004 Document Revised: 11/15/2016 Document Reviewed: 11/15/2016 Elsevier Interactive Patient Education  2018 [employer].  Please be sure medication list is accurate. If a new problem present, please set up appointment sooner than planned today.

## 2017-12-18 ENCOUNTER — Other Ambulatory Visit: Payer: Self-pay | Admitting: Family Medicine

## 2017-12-18 DIAGNOSIS — I83813 Varicose veins of bilateral lower extremities with pain: Secondary | ICD-10-CM

## 2017-12-18 LAB — OVA AND PARASITE EXAMINATION
CONCENTRATE RESULT:: NONE SEEN
SPECIMEN QUALITY: ADEQUATE
TRICHROME RESULT:: NONE SEEN
VKL: 90066501

## 2017-12-18 LAB — C. DIFFICILE GDH AND TOXIN A/B
GDH ANTIGEN: NOT DETECTED
MICRO NUMBER:: 90067567
SPECIMEN QUALITY:: ADEQUATE
TOXIN A AND B: NOT DETECTED

## 2017-12-18 LAB — TIQ-NTM

## 2017-12-21 LAB — STOOL CULTURE
MICRO NUMBER: 90066497
MICRO NUMBER:: 90066498
MICRO NUMBER:: 90066499
SHIGA RESULT:: NOT DETECTED
SPECIMEN QUALITY: ADEQUATE
SPECIMEN QUALITY: ADEQUATE
SPECIMEN QUALITY: ADEQUATE

## 2017-12-23 ENCOUNTER — Encounter: Payer: Self-pay | Admitting: Family Medicine

## 2017-12-29 ENCOUNTER — Telehealth: Payer: Self-pay | Admitting: Family Medicine

## 2017-12-29 NOTE — Telephone Encounter (Signed)
Fax received from [place] and prior auth sent to [email or web address].

## 2017-12-29 NOTE — Telephone Encounter (Signed)
Copied from [place] [id number]. Topic: Quick Communication - Rx Refill/Question >> Dec 29, 2017  9:32 AM Bea Graff, NT wrote: Medication: Dexlansoprazole Ross Marcus)   Has the patient contacted their pharmacy? Yes.     (Agent: If no, request that the patient contact the pharmacy for the refill.)   Preferred Pharmacy (with phone number or street name): [facility] on [address]. Pt says if she cannot get this medicine approved by insurance, would like something that can be approved.   Agent: Please be advised that RX refills may take up to 3 business days. We ask that you follow-up with your pharmacy.

## 2017-12-29 NOTE — Telephone Encounter (Signed)
Patient called to get clarification on the Dexilant. I informed the patient that the medication was sent to [facility] on 11/28/18 and that it had 1 refill. She says "the pharmacy will not refill it. I have been out of the medicine for a few days. The pharmacy is saying they need a physician to approve it, because the insurance is not approving the refill. If she can't get this approved, will she order something else for my heartburn?" I informed her I would notify Dr. Martinique. I called [facility] and they will be faxing over a form to the office for approval of the medication.

## 2017-12-30 NOTE — Telephone Encounter (Signed)
Pt states if the insurance company is giving the office a problem with the prior auth to please call her something else in. Her heart burn is bad and she hasnt had any medication for acid reflux in over a week.

## 2017-12-30 NOTE — Telephone Encounter (Signed)
Note in [email or web address] state the Rx is approved. PA Case: [id number], Status: Approved, Coverage Starts on: 12/30/2017 12:00:00 AM, Coverage Ends on: 12/30/2018 12:00:00 AM. Questions? Contact [phone].  I called the pt, [facility] and informed Tonya Bradley of this.

## 2017-12-30 NOTE — Telephone Encounter (Signed)
Marked as complete via Annabell Sabal.

## 2018-01-05 ENCOUNTER — Other Ambulatory Visit: Payer: Self-pay | Admitting: Family Medicine

## 2018-01-05 DIAGNOSIS — K2100 Gastro-esophageal reflux disease with esophagitis, without bleeding: Secondary | ICD-10-CM

## 2018-01-06 ENCOUNTER — Encounter: Payer: Self-pay | Admitting: Emergency Medicine

## 2018-01-06 ENCOUNTER — Ambulatory Visit (INDEPENDENT_AMBULATORY_CARE_PROVIDER_SITE_OTHER): Payer: Medicare HMO | Admitting: Emergency Medicine

## 2018-01-06 VITALS — BP 130/76 | HR 78 | Ht 62.0 in | Wt 159.0 lb

## 2018-01-06 DIAGNOSIS — J439 Emphysema, unspecified: Secondary | ICD-10-CM | POA: Diagnosis not present

## 2018-01-06 DIAGNOSIS — J9611 Chronic respiratory failure with hypoxia: Secondary | ICD-10-CM | POA: Diagnosis not present

## 2018-01-06 DIAGNOSIS — J438 Other emphysema: Secondary | ICD-10-CM

## 2018-01-06 DIAGNOSIS — Z720 Tobacco use: Secondary | ICD-10-CM

## 2018-01-06 MED ORDER — FLUTICASONE-UMECLIDIN-VILANT 100-62.5-25 MCG/INH IN AEPB: 1.0000 | INHALATION_SPRAY | Freq: Every day | RESPIRATORY_TRACT | 0 refills | Status: DC

## 2018-01-06 NOTE — Progress Notes (Signed)
Subjective:    Patient ID: Tonya Bradley, female    DOB: 26-Jun-1951, 67 y.o.   MRN: [id number]  HPI 67 year old active smoker (24 pack years) with a history of CAD, atrial flutter status post ablation with subsequent atrial fibrillation on amiodarone, GERD.  She has COPD that was diagnosed over 15 yrs ago by her PCP.  She is currently managed with Symbicort 160/4.5, not sure that it helps her.   She has documented walking hypoxemia here and also did at OV with Dr Lovena Le recently. Not always associated w dyspnea. She fall asleep easily, dozes during the day. Fall asleep watching TV. She snores loudly. She had a negative PSG in 2003. She hears wheeze. She has had a cough, worse recent but always present in the am.  Usually dry cough.   CXR 08/27/17 >>  no infiltrates.  LDCT 04/03/17 >> RADS 1, biapical scarring, no mass or nodules.    Review of Systems  Constitutional: Negative for fever and unexpected weight change.  HENT: Negative for congestion, dental problem, ear pain, nosebleeds, postnasal drip, rhinorrhea, sinus pressure, sneezing, sore throat and trouble swallowing.   Eyes: Negative for redness and itching.  Respiratory: Positive for cough and shortness of breath. Negative for chest tightness and wheezing.   Cardiovascular: Negative for palpitations and leg swelling.  Gastrointestinal: Negative for nausea and vomiting.  Genitourinary: Negative for dysuria.  Musculoskeletal: Negative for joint swelling.  Skin: Negative for rash.  Neurological: Negative for headaches.  Hematological: Does not bruise/bleed easily.  Psychiatric/Behavioral: Negative for dysphoric mood. The patient is not nervous/anxious.    Past Medical History:  Diagnosis Date  . Anemia   . Anxiety   . Atrial flutter ([place]) 03/13/2016  . CAD (coronary artery disease)   . Cancer ([place])    skin cancer  . Cataract   . Colon polyp   . COPD (chronic obstructive pulmonary disease) ([place])   . Depression   . Emphysema of  lung ([place])   . GERD (gastroesophageal reflux disease)   . Hypercholesterolemia   . Osteoporosis   . Oxygen deficiency      Family History  Problem Relation Age of Onset  . Breast cancer Sister 14  . Heart disease Sister   . Heart attack Father   . Heart disease Father   . Stroke Mother   . Heart disease Mother   . Hyperlipidemia Mother   . Heart attack Sister   . Heart disease Brother        ?heart failure  . Congestive Heart Failure Brother   . Hypertension Son   . Heart disease Maternal Grandfather   . Cancer Cousin   . Hypertension Son   . Colon cancer Neg Hx      Social History   Socioeconomic History  . Marital status: Married    Spouse name: Not on file  . Number of children: 3  . Years of education: 10  . Highest education level: Not on file  Social Needs  . Financial resource strain: Not on file  . Food insecurity - worry: Not on file  . Food insecurity - inability: Not on file  . Transportation needs - medical: Not on file  . Transportation needs - non-medical: Not on file  Occupational History  . 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 Smoking status: Current Every Day Smoker    Packs/day: 1.50    Years: 45.00    Pack years:  67.50    Types: Cigarettes    Start date: 06/14/1966  . Smokeless tobacco: Never Used  . Tobacco comment: 1-2 packs daily  Substance and Sexual Activity  . Alcohol use: No    Alcohol/week: 0.0 oz  . Drug use: No  . Sexual activity: Yes    Birth control/protection: Post-menopausal  Other Topics Concern  . Not on file  Social History Narrative   Quit high school and got married in 10th grade, age 64   First husband died of colon cancer at young age   Married again 2018   Lives in own home   Daughter lives with her   Drinks coffee seldom, too much "sweet tea"     No Known Allergies   Outpatient Medications Prior to Visit  Medication Sig Dispense Refill  . ALPRAZolam (XANAX) 0.5 MG  tablet Take 1 tablet (0.5 mg total) by mouth 2 (two) times daily as needed for anxiety (no more than 40 tabs per month.). 40 tablet 2  . amiodarone (PACERONE) 200 MG tablet Take 1/2 tablet (100 mg) by mouth once daily 90 tablet 3  . aspirin EC 81 MG tablet Take 81 mg by mouth daily.    Marland Kitchen atorvastatin (LIPITOR) 80 MG tablet Take 1 tablet (80 mg total) by mouth daily. 90 tablet 1  . BESIVANCE 0.6 % SUSP INSTILL ONE DROP INTO THE LEFT EYE TID  1  . budesonide-formoterol (SYMBICORT) 160-4.5 MCG/ACT inhaler Inhale 2 puffs into the lungs 2 (two) times daily. 1 Inhaler 3  . Cholecalciferol (VITAMIN D) 2000 units CAPS Take 2,000 Units by mouth daily.    Marland Kitchen Dexlansoprazole (DEXILANT) 30 MG capsule Take 1 capsule (30 mg total) by mouth daily. 30 capsule 1  . DULoxetine (CYMBALTA) 60 MG capsule TAKE 1 CAPSULE (60 MG TOTAL) BY MOUTH DAILY. 90 capsule 1  . DUREZOL 0.05 % EMUL INSTILL ONE DROP INTO THE LEFT EYE TID  1  . furosemide (LASIX) 20 MG tablet TAKE 1 TABLET(20 MG) BY MOUTH TWICE DAILY 60 tablet 0  . linaclotide (LINZESS) 72 MCG capsule Take 1 capsule (72 mcg total) by mouth daily before breakfast. 30 capsule 1  . Multiple Vitamin (MULTIVITAMIN) tablet Take 1 tablet by mouth daily.    . nitroGLYCERIN (NITROSTAT) 0.4 MG SL tablet Place 0.4 mg under the tongue every 5 (five) minutes as needed for chest pain. Reported on 06/12/2016    . pantoprazole (PROTONIX) 40 MG tablet TAKE 1 TABLET EVERY DAY 90 tablet 0  . rOPINIRole (REQUIP) 0.25 MG tablet Take 3 tablets (0.75 mg total) by mouth at bedtime. 90 tablet 1  . traZODone (DESYREL) 50 MG tablet Take 1 tablet (50 mg total) by mouth at bedtime as needed for sleep. 90 tablet 1  . VENTOLIN HFA 108 (90 Base) MCG/ACT inhaler INHALE 2 PUFFS INTO THE LUNGS EVERY 6 HOURS AS NEEDED FOR WHEEZING OR SHORTNESS OF BREATH 18 g 0   No facility-administered medications prior to visit.         Objective:   Physical Exam Vitals:   01/06/18 1546  BP: 130/76  Pulse: 78    SpO2: 100%  Weight: 159 lb (72.1 kg)  Height: 5\' 2"  (1.575 m)   Gen: Pleasant, well-nourished, in no distress,  normal affect  ENT: No lesions,  mouth clear,  oropharynx clear, no postnasal drip  Neck: No JVD, no stridor  Lungs: No use of accessory muscles, distant, few end-expiratory wheezes  Cardiovascular: RRR, heart sounds normal, no murmur or  gallops, no peripheral edema  Musculoskeletal: No deformities, no cyanosis or clubbing  Neuro: alert, non focal  Skin: Warm, no lesions or rash     Assessment & Plan:  Chronic hypoxemic respiratory failure (HCC) Suspect that this is due to COPD.  She is on amiodarone but a chest x-ray from 08/27/17 did not show any infiltrates.  Depending on her pulmonary function testing we may decide to image her further.  Her low-dose CT scan 04/03/17 did not show any interstitial infiltrates.  We documented desaturation today.  She needs to start submental oxygen.  She is willing to do so and we will order today  COPD (chronic obstructive pulmonary disease) (HCC) We will stop Symbicort for now.  Start Trelegy 1 inhalation once a day.  Rinse and gargle after using this medication.  Depending on how you respond we may decide to continue this and order it through your pharmacy. Use albuterol 2 puffs if needed for shortness of breath, wheezing, chest tightness. We will continue to check your annual lung cancer screening CT scan, next in May 2019. Follow with Dr Lamonte Sakai next available with full PFT on the same day.  Tobacco abuse Discussed cessation with her today.  She is not anywhere near ready to set a quit date.  We will try to set achievable goals, first goal will be to cut down to 1 pack daily.  Baltazar Apo, MD, PhD 01/06/2018, 4:25 PM [facility] [phone] or if no answer [phone]

## 2018-01-06 NOTE — Assessment & Plan Note (Signed)
Discussed cessation with her today.  She is not anywhere near ready to set a quit date.  We will try to set achievable goals, first goal will be to cut down to 1 pack daily.

## 2018-01-06 NOTE — Assessment & Plan Note (Signed)
We will stop Symbicort for now.  Start Trelegy 1 inhalation once a day.  Rinse and gargle after using this medication.  Depending on how you respond we may decide to continue this and order it through your pharmacy. Use albuterol 2 puffs if needed for shortness of breath, wheezing, chest tightness. We will continue to check your annual lung cancer screening CT scan, next in May 2019. Follow with Dr Lamonte Sakai next available with full PFT on the same day.

## 2018-01-06 NOTE — Assessment & Plan Note (Signed)
Suspect that this is due to COPD.  She is on amiodarone but a chest x-ray from 08/27/17 did not show any infiltrates.  Depending on her pulmonary function testing we may decide to image her further.  Her low-dose CT scan 04/03/17 did not show any interstitial infiltrates.  We documented desaturation today.  She needs to start submental oxygen.  She is willing to do so and we will order today

## 2018-01-06 NOTE — Patient Instructions (Signed)
We will stop Symbicort for now.  Start Trelegy 1 inhalation once a day.  Rinse and gargle after using this medication.  Depending on how you respond we may decide to continue this and order it through your pharmacy. We will start oxygen at 2 L/min you need to wear this at all times.  We will get the most portable system possible. You need to decrease her smoking.  Set a goal to smoke no more than 1 pack daily. Use albuterol 2 puffs if needed for shortness of breath, wheezing, chest tightness. We will continue to check your annual lung cancer screening CT scan, next in May 2019. Follow with Dr Lamonte Sakai next available with full PFT on the same day.

## 2018-01-07 ENCOUNTER — Telehealth: Payer: Self-pay | Admitting: Emergency Medicine

## 2018-01-07 DIAGNOSIS — J449 Chronic obstructive pulmonary disease, unspecified: Secondary | ICD-10-CM | POA: Diagnosis not present

## 2018-01-07 DIAGNOSIS — J9611 Chronic respiratory failure with hypoxia: Secondary | ICD-10-CM

## 2018-01-07 NOTE — Telephone Encounter (Signed)
Spoke with Tonya Bradley  She states she is unsure if o2 order will be approved due to the way we worded the order  I advised I changed the order to state that she needs o2 24/7 and needs eval for POC  She is going to check and see if this will work and if not will call us back and advise what we need to do  Will await her call back

## 2018-01-07 NOTE — Telephone Encounter (Signed)
On hold for 5 min  WCB  I have corrected order

## 2018-01-12 ENCOUNTER — Ambulatory Visit (INDEPENDENT_AMBULATORY_CARE_PROVIDER_SITE_OTHER): Payer: Medicare HMO | Admitting: Family Medicine

## 2018-01-12 ENCOUNTER — Encounter: Payer: Self-pay | Admitting: Family Medicine

## 2018-01-12 VITALS — BP 136/88 | HR 86 | Temp 98.2°F | Resp 16 | Ht 62.0 in | Wt 158.0 lb

## 2018-01-12 DIAGNOSIS — J069 Acute upper respiratory infection, unspecified: Secondary | ICD-10-CM

## 2018-01-12 DIAGNOSIS — R05 Cough: Secondary | ICD-10-CM

## 2018-01-12 DIAGNOSIS — J439 Emphysema, unspecified: Secondary | ICD-10-CM

## 2018-01-12 DIAGNOSIS — R110 Nausea: Secondary | ICD-10-CM

## 2018-01-12 DIAGNOSIS — R059 Cough, unspecified: Secondary | ICD-10-CM

## 2018-01-12 LAB — POCT INFLUENZA A/B
INFLUENZA A, POC: NEGATIVE
INFLUENZA B, POC: NEGATIVE

## 2018-01-12 MED ORDER — ONDANSETRON HCL 4 MG PO TABS: 4.0000 mg | ORAL_TABLET | Freq: Three times a day (TID) | ORAL | 0 refills | Status: AC | PRN

## 2018-01-12 NOTE — Progress Notes (Signed)
ACUTE VISIT  HPI:  Chief Complaint  Patient presents with  . Cough    sx started 2 weeks ago  . Nasal Congestion  . Nausea  . Poor appetite    Tonya Bradley is a 67 y.o.female here today complaining of 2 weeks of nonproductive cough, nasal congestion, dysphonia, and nausea. She has no noted fever, chills, or body aches.   She has history of dysphonia, she has follow-up with ENT. History of COPD, recently she was placed on continuous supplemental O2 due to hypoxia.  She has an appointment with pulmonologist for a PFT in a few days.  Wheezing and exertional dyspnea are stable. She is supposed to be on Symbicort 160 -4.5 mcg twice daily but she is not using it as instructed.  She uses Albuterol inhaler as needed, she has not used daily.  She has history of GERD, she is reporting improvement of heartburn since Dexilant was started.  She has history of diarrhea and constipation, she denies prior Hx of nausea before 2 weeks ago.  It is exacerbated by food intake, she has not identified alleviating factors. No vomiting or changes in bowel habits, she has about 2-3 stools daily.  Cough  This is a chronic problem. The current episode started 1 to 4 weeks ago. The problem has been waxing and waning. The cough is non-productive. Associated symptoms include nasal congestion, postnasal drip, rhinorrhea, a sore throat, shortness of breath and wheezing. Pertinent negatives include no chest pain, chills, ear congestion, ear pain, eye redness, fever, headaches, heartburn, hemoptysis, myalgias, rash or weight loss. The symptoms are aggravated by exercise. Risk factors for lung disease include smoking/tobacco exposure. She has tried a beta-agonist inhaler for the symptoms. The treatment provided mild relief. Her past medical history is significant for COPD, emphysema and environmental allergies.     No Hx of recent travel. No sick contact. No known insect bite.  Hx of allergies:  Yes, allergic rhinitis.  OTC medications for this problem: None  Symptoms otherwise stable.   Review of Systems  Constitutional: Positive for appetite change and fatigue (No more than usual.). Negative for activity change, chills, fever and weight loss.  HENT: Positive for congestion, postnasal drip, rhinorrhea, sinus pressure, sore throat and voice change. Negative for ear pain, mouth sores and trouble swallowing.   Eyes: Negative for discharge and redness.  Respiratory: Positive for cough, shortness of breath and wheezing. Negative for hemoptysis.   Cardiovascular: Negative for chest pain.  Gastrointestinal: Positive for diarrhea and nausea. Negative for abdominal pain, heartburn and vomiting.       No changes in bowel habits.  Genitourinary: Negative for decreased urine volume and hematuria.  Musculoskeletal: Negative for gait problem and myalgias.  Skin: Negative for rash.  Allergic/Immunologic: Positive for environmental allergies.  Neurological: Negative for speech difficulty, weakness and headaches.  Hematological: Negative for adenopathy. Does not bruise/bleed easily.  Psychiatric/Behavioral: Negative for hallucinations. The patient is nervous/anxious.       Current Outpatient Medications on File Prior to Visit  Medication Sig Dispense Refill  . ALPRAZolam (XANAX) 0.5 MG tablet Take 1 tablet (0.5 mg total) by mouth 2 (two) times daily as needed for anxiety (no more than 40 tabs per month.). 40 tablet 2  . amiodarone (PACERONE) 200 MG tablet Take 1/2 tablet (100 mg) by mouth once daily (Patient taking differently: Take 100 mg by mouth daily. Take 1/2 tablet (100 mg) by mouth once daily) 90 tablet 3  .  aspirin EC 81 MG tablet Take 81 mg by mouth daily.    Marland Kitchen atorvastatin (LIPITOR) 80 MG tablet Take 1 tablet (80 mg total) by mouth daily. 90 tablet 1  . Cholecalciferol (VITAMIN D) 2000 units CAPS Take 2,000 Units by mouth daily.    . cycloSPORINE (RESTASIS) 0.05 % ophthalmic  emulsion Place 1 drop into both eyes 2 (two) times daily.    Marland Kitchen Dexlansoprazole (DEXILANT) 30 MG capsule Take 1 capsule (30 mg total) by mouth daily. 30 capsule 1  . DULoxetine (CYMBALTA) 60 MG capsule TAKE 1 CAPSULE (60 MG TOTAL) BY MOUTH DAILY. (Patient taking differently: Take 60 mg by mouth every evening. ) 90 capsule 1  . Fluticasone-Umeclidin-Vilant (TRELEGY ELLIPTA) 100-62.5-25 MCG/INH AEPB Inhale 1 puff into the lungs daily. 28 each 0  . furosemide (LASIX) 20 MG tablet TAKE 1 TABLET(20 MG) BY MOUTH TWICE DAILY (Patient taking differently: TAKE 2 TABLET (40 MG) BY MOUTH DAILY) 60 tablet 0  . ibuprofen (ADVIL,MOTRIN) 200 MG tablet Take 400-600 mg by mouth every 8 (eight) hours as needed for headache or mild pain (2-3 tablets depending on pain).    Marland Kitchen linaclotide (LINZESS) 72 MCG capsule Take 1 capsule (72 mcg total) by mouth daily before breakfast. 30 capsule 1  . Multiple Vitamin (MULTIVITAMIN) tablet Take 1 tablet by mouth daily.    . nitroGLYCERIN (NITROSTAT) 0.4 MG SL tablet Place 0.4 mg under the tongue every 5 (five) minutes as needed for chest pain. Reported on 06/12/2016    . pantoprazole (PROTONIX) 40 MG tablet TAKE 1 TABLET EVERY DAY 90 tablet 0  . rOPINIRole (REQUIP) 0.25 MG tablet Take 3 tablets (0.75 mg total) by mouth at bedtime. 90 tablet 1  . traZODone (DESYREL) 50 MG tablet Take 1 tablet (50 mg total) by mouth at bedtime as needed for sleep. (Patient taking differently: Take 50 mg by mouth at bedtime. ) 90 tablet 1  . VENTOLIN HFA 108 (90 Base) MCG/ACT inhaler INHALE 2 PUFFS INTO THE LUNGS EVERY 6 HOURS AS NEEDED FOR WHEEZING OR SHORTNESS OF BREATH 18 g 0  . budesonide-formoterol (SYMBICORT) 160-4.5 MCG/ACT inhaler Inhale 2 puffs into the lungs 2 (two) times daily. (Patient not taking: Reported on 01/12/2018) 1 Inhaler 3   No current facility-administered medications on file prior to visit.      Past Medical History:  Diagnosis Date  . Anemia   . Anxiety   . Atrial flutter  ([place]) 03/13/2016  . CAD (coronary artery disease)   . Cancer ([place])    skin cancer  . Cataract   . Colon polyp   . COPD (chronic obstructive pulmonary disease) ([place])   . Depression   . Emphysema of lung ([place])   . GERD (gastroesophageal reflux disease)   . Hypercholesterolemia   . Osteoporosis   . Oxygen deficiency    No Known Allergies  Social History   Socioeconomic History  . Marital status: Married    Spouse name: None  . Number of children: 3  . Years of education: 10  . Highest education level: None  Social Needs  . Financial resource strain: None  . Food insecurity - worry: None  . Food insecurity - inability: None  . Transportation needs - medical: None  . Transportation needs - non-medical: None  Occupational History  . 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 Smoking status: Current Every Day Smoker    Packs/day: 1.50    Years: 45.00  Pack years: 67.50    Types: Cigarettes    Start date: 06/14/1966  . Smokeless tobacco: Never Used  . Tobacco comment: 1-2 packs daily  Substance and Sexual Activity  . Alcohol use: No    Alcohol/week: 0.0 oz  . Drug use: No  . Sexual activity: Yes    Birth control/protection: Post-menopausal  Other Topics Concern  . None  Social History Narrative   Quit high school and got married in 10th grade, age 33   First husband died of colon cancer at young age   Married again 2018   Lives in own home   Daughter lives with her   Drinks coffee seldom, too much "sweet tea"    Vitals:   01/12/18 1655  BP: 136/88  Pulse: 86  Resp: 16  Temp: 98.2 F (36.8 C)  SpO2: 94%   Body mass index is 28.9 kg/m.   Physical Exam  Nursing note and vitals reviewed. Constitutional: She is oriented to person, place, and time. She appears well-developed. She does not appear ill. No distress.  HENT:  Head: Normocephalic and atraumatic.  Nose: Rhinorrhea present. Right sinus exhibits no maxillary sinus  tenderness and no frontal sinus tenderness. Left sinus exhibits no maxillary sinus tenderness and no frontal sinus tenderness.  Mouth/Throat: Oropharynx is clear and moist and mucous membranes are normal.  Dysphonia,moderate.   Eyes: Conjunctivae are normal. No scleral icterus.  Cardiovascular: Normal rate and regular rhythm.  No murmur heard. Respiratory: Effort normal and breath sounds normal. No respiratory distress.  GI: Soft. Bowel sounds are normal. She exhibits no distension and no mass. There is no hepatomegaly. There is no tenderness.  Musculoskeletal: She exhibits no edema.  Lymphadenopathy:    She has no cervical adenopathy.       Right: No supraclavicular adenopathy present.       Left: No supraclavicular adenopathy present.  Neurological: She is alert and oriented to person, place, and time. She has normal strength.  Skin: Skin is warm. No rash noted. No erythema.  Psychiatric: Her mood appears anxious.  Well groomed, good eye contact.     ASSESSMENT AND PLAN:  Ms.Innocence was seen today for cough, nasal congestion, nausea and poor appetite.  Diagnoses and all orders for this visit:  Nausea without vomiting  We discussed possible etiologies, including GERD, dyspepsia,IBS, medication side effects among some. At this time I am recommending symptomatic treatment with Zofran. Try to avoid foods that could aggravate problem, smaller and frequent meals may help. Instructed about warning signs. Follow-up in 3-4 weeks, before if needed.  -     ondansetron (ZOFRAN) 4 MG tablet; Take 1 tablet (4 mg total) by mouth every 8 (eight) hours as needed for up to 5 days for nausea or vomiting.  Cough  Could be residual from recent URI, COPD, GERD among some. This problem seems to be chronic, we had done CXR, last one in 08/2017.  She is not interested in further imaging study. Instructed about warning signs. Lung cancer screening CT 04/2017: Negative.  -     POCT Influenza  A/B  Upper respiratory tract infection, unspecified type  Most likely viral etiology. Explained that I do not think antibiotic is needed at this time. Rapid flu test done because her husband have been sick sine yesterday with influenza like symptoms,negative.  -     POCT Influenza A/B  Pulmonary emphysema, unspecified emphysema type ([place])  She is now on continuous supplemental O2. No changes in current management.  Encourage smoking cessation. Continue following with pulmonologist.    -Ms. Kym Groom was advised to seek attention immediately if symptoms worsen or to follow if they persist or new concerns arise.       Jaelen Gellerman G. Martinique, MD  [facility] [place] office.

## 2018-01-12 NOTE — Patient Instructions (Addendum)
A few things to remember from today's visit:   Cough - Plan: POCT Influenza A/B  Nausea without vomiting - Plan: POCT Influenza A/B  Upper respiratory tract infection, unspecified type - Plan: POCT Influenza A/B  Pulmonary emphysema, unspecified emphysema type ([place])   Please be sure medication list is accurate. If a new problem present, please set up appointment sooner than planned today.            Nausea, Adult Feeling sick to your stomach (nausea) means that your stomach is upset or you feel like you have to throw up (vomit). Feeling sick to your stomach is usually not serious, but it may be an early sign of a more serious medical problem. As you feel sicker to your stomach, it can lead to throwing up (vomiting). If you throw up, or if you are not able to drink enough fluids, there is a risk of dehydration. Dehydration can make you feel tired and thirsty, have a dry mouth, and pee (urinate) less often. Older adults and people who have other diseases or a weak defense (immune) system have a higher risk of dehydration. The main goal of treating this condition is to:  Limit how often you feel sick to your stomach.  Prevent throwing up and dehydration.  Follow these instructions at home: Follow instructions from your doctor about how to care for yourself at home. Eating and drinking Follow these recommendations as told by your doctor:  Take an oral rehydration solution (ORS). This is a drink that is sold at pharmacies and stores.  Drink clear fluids in small amounts as you are able, such as: ? Water. ? Ice chips. ? Fruit juice that has water added (diluted fruit juice). ? Low-calorie sports drinks.  Eat bland, easy to digest foods in small amounts as you are able, such as: ? Bananas. ? Applesauce. ? Rice. ? Lean meats. ? Toast. ? Crackers.  Avoid drinking fluids that contain a lot of sugar or caffeine.  Avoid alcohol.  Avoid spicy or fatty foods.  General  instructions  Drink enough fluid to keep your pee (urine) clear or pale yellow.  Wash your hands often. If you cannot use soap and water, use hand sanitizer.  Make sure that all people in your household wash their hands well and often.  Rest at home while you get better.  Take over-the-counter and prescription medicines only as told by your doctor.  Breathe slowly and deeply when you feel sick to your stomach.  Watch your condition for any changes.  Keep all follow-up visits as told by your doctor. This is important. Contact a doctor if:  You have a headache.  You have new symptoms.  You feel sicker to your stomach.  You have a fever.  You feel light-headed or dizzy.  You throw up.  You are not able to keep fluids down. Get help right away if:  You have pain in your chest, neck, arm, or jaw.  You feel very weak or you pass out (faint).  You have throw up that is bright red or looks like coffee grounds.  You have bloody or black poop (stools), or poop that looks like tar.  You have a very bad headache, a stiff neck, or both.  You have very bad pain, cramping, or bloating in your belly.  You have a rash.  You have trouble breathing or you are breathing very quickly.  Your heart is beating very quickly.  Your skin feels cold and  clammy.  You feel confused.  You have pain while peeing.  You have signs of dehydration, such as: ? Dark pee, or very little or no pee. ? Cracked lips. ? Dry mouth. ? Sunken eyes. ? Sleepiness. ? Weakness. These symptoms may be an emergency. Do not wait to see if the symptoms will go away. Get medical help right away. Call your local emergency services (911 in the [place] Do not drive yourself to the hospital. This information is not intended to replace advice given to you by your health care provider. Make sure you discuss any questions you have with your health care provider. Document Released: 11/07/2011 Document Revised:  04/25/2016 Document Reviewed: 07/25/2015 Elsevier Interactive Patient Education  [employer].

## 2018-01-14 ENCOUNTER — Encounter (HOSPITAL_COMMUNITY)
Admission: RE | Admit: 2018-01-14 | Discharge: 2018-01-14 | Disposition: A | Discharge status: Home / Self Care | Payer: Medicare HMO | Source: Ambulatory Visit | Attending: Surgery | Admitting: Surgery

## 2018-01-14 ENCOUNTER — Encounter (HOSPITAL_COMMUNITY): Payer: Self-pay

## 2018-01-14 ENCOUNTER — Other Ambulatory Visit: Payer: Self-pay | Admitting: Family Medicine

## 2018-01-14 ENCOUNTER — Other Ambulatory Visit: Payer: Self-pay

## 2018-01-14 ENCOUNTER — Encounter (HOSPITAL_COMMUNITY)
Admission: RE | Admit: 2018-01-14 | Discharge: 2018-01-14 | Disposition: A | Discharge status: Home / Self Care | Payer: Medicare HMO | Source: Ambulatory Visit | Attending: Orthopaedic Surgery | Admitting: Orthopaedic Surgery

## 2018-01-14 DIAGNOSIS — F17200 Nicotine dependence, unspecified, uncomplicated: Secondary | ICD-10-CM | POA: Insufficient documentation

## 2018-01-14 DIAGNOSIS — Z79899 Other long term (current) drug therapy: Secondary | ICD-10-CM | POA: Diagnosis not present

## 2018-01-14 DIAGNOSIS — J449 Chronic obstructive pulmonary disease, unspecified: Secondary | ICD-10-CM | POA: Insufficient documentation

## 2018-01-14 DIAGNOSIS — Z9981 Dependence on supplemental oxygen: Secondary | ICD-10-CM | POA: Insufficient documentation

## 2018-01-14 DIAGNOSIS — Z7982 Long term (current) use of aspirin: Secondary | ICD-10-CM | POA: Diagnosis not present

## 2018-01-14 DIAGNOSIS — Z9889 Other specified postprocedural states: Secondary | ICD-10-CM | POA: Diagnosis not present

## 2018-01-14 DIAGNOSIS — D649 Anemia, unspecified: Secondary | ICD-10-CM | POA: Diagnosis not present

## 2018-01-14 DIAGNOSIS — K219 Gastro-esophageal reflux disease without esophagitis: Secondary | ICD-10-CM | POA: Diagnosis not present

## 2018-01-14 DIAGNOSIS — I2510 Atherosclerotic heart disease of native coronary artery without angina pectoris: Secondary | ICD-10-CM | POA: Diagnosis not present

## 2018-01-14 DIAGNOSIS — I4892 Unspecified atrial flutter: Secondary | ICD-10-CM | POA: Diagnosis not present

## 2018-01-14 DIAGNOSIS — Z01818 Encounter for other preprocedural examination: Secondary | ICD-10-CM | POA: Diagnosis not present

## 2018-01-14 DIAGNOSIS — E785 Hyperlipidemia, unspecified: Secondary | ICD-10-CM | POA: Diagnosis not present

## 2018-01-14 DIAGNOSIS — R918 Other nonspecific abnormal finding of lung field: Secondary | ICD-10-CM | POA: Diagnosis not present

## 2018-01-14 HISTORY — DX: Cardiac arrhythmia, unspecified: I49.9

## 2018-01-14 HISTORY — DX: Unspecified osteoarthritis, unspecified site: M19.90

## 2018-01-14 LAB — COMPREHENSIVE METABOLIC PANEL
ALT: 18 U/L (ref 14–54)
AST: 19 U/L (ref 15–41)
Albumin: 3.3 g/dL — ABNORMAL LOW (ref 3.5–5.0)
Alkaline Phosphatase: 107 U/L (ref 38–126)
Anion gap: 13 (ref 5–15)
BILIRUBIN TOTAL: 0.6 mg/dL (ref 0.3–1.2)
BUN: 16 mg/dL (ref 6–20)
CO2: 30 mmol/L (ref 22–32)
CREATININE: 0.84 mg/dL (ref 0.44–1.00)
Calcium: 8.9 mg/dL (ref 8.9–10.3)
Chloride: 97 mmol/L — ABNORMAL LOW (ref 101–111)
Glucose, Bld: 100 mg/dL — ABNORMAL HIGH (ref 65–99)
Potassium: 3.2 mmol/L — ABNORMAL LOW (ref 3.5–5.1)
Sodium: 140 mmol/L (ref 135–145)
TOTAL PROTEIN: 6.6 g/dL (ref 6.5–8.1)

## 2018-01-14 LAB — URINALYSIS, ROUTINE W REFLEX MICROSCOPIC
BILIRUBIN URINE: NEGATIVE
Glucose, UA: NEGATIVE mg/dL
HGB URINE DIPSTICK: NEGATIVE
Ketones, ur: NEGATIVE mg/dL
Leukocytes, UA: NEGATIVE
Nitrite: NEGATIVE
PROTEIN: NEGATIVE mg/dL
Specific Gravity, Urine: 1.019 (ref 1.005–1.030)
pH: 6 (ref 5.0–8.0)

## 2018-01-14 LAB — CBC
HEMATOCRIT: 41.7 % (ref 36.0–46.0)
Hemoglobin: 13.5 g/dL (ref 12.0–15.0)
MCH: 30.3 pg (ref 26.0–34.0)
MCHC: 32.4 g/dL (ref 30.0–36.0)
MCV: 93.7 fL (ref 78.0–100.0)
Platelets: 338 10*3/uL (ref 150–400)
RBC: 4.45 MIL/uL (ref 3.87–5.11)
RDW: 14.5 % (ref 11.5–15.5)
WBC: 12.4 10*3/uL — AB (ref 4.0–10.5)

## 2018-01-14 LAB — SURGICAL PCR SCREEN
MRSA, PCR: NEGATIVE
Staphylococcus aureus: NEGATIVE

## 2018-01-14 LAB — APTT: APTT: 32 s (ref 24–36)

## 2018-01-14 LAB — PROTIME-INR
INR: 0.93
Prothrombin Time: 12.4 seconds (ref 11.4–15.2)

## 2018-01-14 NOTE — Pre-Procedure Instructions (Signed)
Tatem Holsonback  01/14/2018      [facility] [place], [address] AT [address] [place] [address] Phone: [phone] Fax: [phone]    Your procedure is scheduled on 01/19/2018.  Report to [facility] Admitting at 1030 A.M.  Call this number if you have problems the morning of surgery:  [phone]   Remember:  Do not eat food or drink liquids after midnight.   Continue all medications as directed by your physician except follow these medication instructions before surgery below   Take these medicines the morning of surgery with A SIP OF WATER: Alprazolam (Xanax) - if needed for anxiety Amiodarone (Pacerone) Cyclosporine (Restasis) Dexlansoprazole (Dexilant) Fluticasone-Umeclidin-Vilant (Trelegy Ellipta) Ondansetron (Zofran) - if needed for nausea Pantoprazole (Protonix) Ventolin inhaler - if needed for wheezing or shortness of breath (Bring with you to the hospital on the day of surgery)  7 days prior to surgery STOP taking any Aspirin (unless otherwise instructed by your surgeon), Aleve, Naproxen, Ibuprofen, Motrin, Advil, Goody's, BC's, all herbal medications, fish oil, and all vitamins     Do not wear jewelry, make-up or nail polish.  Do not wear lotions, powders, or perfumes, or deodorant.  Do not shave 48 hours prior to surgery.    Do not bring valuables to the hospital.  [facility] is not responsible for any belongings or valuables.  Hearing aids, eyeglasses, contacts, dentures or partials, may not be worn into surgery.  Leave your suitcase in the car.  After surgery it may be brought to your room.  For patients admitted to the hospital, discharge time will be determined by your treatment team.  Patients discharged the day of surgery will not be allowed to drive home.   Name and phone number of your driver:    Special instructions:   [facility]- Preparing For  Surgery  Before surgery, you can play an important role. Because skin is not sterile, your skin needs to be as free of germs as possible. You can reduce the number of germs on your skin by washing with CHG (chlorahexidine gluconate) Soap before surgery.  CHG is an antiseptic cleaner which kills germs and bonds with the skin to continue killing germs even after washing.  Please do not use if you have an allergy to CHG or antibacterial soaps. If your skin becomes reddened/irritated stop using the CHG.  Do not shave (including legs and underarms) for at least 48 hours prior to first CHG shower. It is OK to shave your face.  Please follow these instructions carefully.   1. Shower the NIGHT BEFORE SURGERY and the MORNING OF SURGERY with CHG.   2. If you chose to wash your hair, wash your hair first as usual with your normal shampoo.  3. After you shampoo, rinse your hair and body thoroughly to remove the shampoo.  4. Use CHG as you would any other liquid soap. You can apply CHG directly to the skin and wash gently with a scrungie or a clean washcloth.   5. Apply the CHG Soap to your body ONLY FROM THE NECK DOWN.  Do not use on open wounds or open sores. Avoid contact with your eyes, ears, mouth and genitals (private parts). Wash Face and genitals (private parts)  with your normal soap.  6. Wash thoroughly, paying special attention to the area where your surgery will be performed.  7. Thoroughly rinse  your body with warm water from the neck down.  8. DO NOT shower/wash with your normal soap after using and rinsing off the CHG Soap.  9. Pat yourself dry with a CLEAN TOWEL.  10. Wear CLEAN PAJAMAS to bed the night before surgery, wear comfortable clothes the morning of surgery  11. Place CLEAN SHEETS on your bed the night of your first shower and DO NOT SLEEP WITH PETS.    Day of Surgery: Shower as stated above. Do not apply any deodorants/lotions. Please wear clean clothes to the  hospital/surgery center.      Please read over the following fact sheets that you were given.

## 2018-01-14 NOTE — Progress Notes (Signed)
PCP - Dr. Betty Martinique Cardiologist -  Dr. Lovena Le  Chest x-ray - 01/14/2018 EKG - 12/10/2017 Stress Test - 08/13/2017 ECHO - 03/14/2016 Cardiac Cath - 2001?  Patient is unsure of exact date.  Sleep Study - patient denies   Anesthesia review: yes, cardiac hx  Patient denies shortness of breath, fever, cough and chest pain at PAT appointment   Patient verbalized understanding of instructions that were given to them at the PAT appointment. Patient was also instructed that they will need to review over the PAT instructions again at home before surgery.

## 2018-01-15 NOTE — Progress Notes (Signed)
Anesthesia Chart Review:  Pt is a 67 year old female scheduled for L4-5 decompression on 01/19/2018 with Rodell Perna, MD  - PCP is Betty Martinique, MD - Cardiologist is Carlyle Dolly, MD. Last office visit 07/30/17 - EP cardiologist is Cristopher Peru, MD. Last office visit 12/10/17 - Pulmonologist is Baltazar Apo, MD. Last office visit 01/06/18  PMH includes:  CAD (prior stenting), atrial flutter (s/p ablation 05/19/17), hyperlipidemia, COPD (now on continuous supplemental oxygen), anemia, GERD. Current smoker. BMI 30  Medications include: amiodarone, ASA 81mg , lipitor, symbicort, dexilant, trelegy ellipta, lasix, protonix, albuterol.   BP (!) 104/54   Pulse 80   Temp 36.9 C   Resp 18   Ht 5\' 2"  (1.575 m)   Wt 164 lb 12.8 oz (74.8 kg)   SpO2 95%   BMI 30.14 kg/m   Preoperative labs reviewed.    CXR 01/14/18:  - Chronic lung changes. - No acute abnormalities  EKG 12/10/17:  - NSR, R superior axis deviation. Incomplete RBBB. RVH. Possible inferior infarct, age undetermined.   Nuclear stress test 08/13/17:   Nuclear stress EF: 59%. No wall motion abnormalities.  There was no ST segment deviation noted during stress.  The study is normal. No perfusion defects identified. No ischemia.  This is a low risk study.  CT chest 04/03/17:  1. Lung-RADS Category 1, negative. Continue annual screening with low-dose chest CT without contrast in 12 months. 2. Coronary artery atherosclerosis.  Carotid duplex 04/08/16:  1. Minimal amount of left-sided atherosclerotic plaque, not resulting in a hemodynamically significant stenosis. 2. Unremarkable sonographic appearance of the right carotid system.  Echo 03/14/16:  - Left ventricle: The cavity size was normal. Wall thickness was increased in a pattern of mild LVH. Systolic function was normal. The estimated ejection fraction was in the range of 55% to 60%. Wall motion was normal; there were no regional wall motion abnormalities. - Aortic valve: Valve  area (VTI): 2.56 cm^2. Valve area (Vmax): 2.15 cm^2. - Atrial septum: There was increased thickness of the septum, consistent with lipomatous hypertrophy. - Inferior vena cava: The vessel was dilated. The respirophasic diameter changes were blunted (< 50%), consistent with elevated central venous pressure. - Technically adequate study.  By notes, pt had cardiac cath in 2012 that showed prior stenting, and patent coronaries.   If no changes, I anticipate pt can proceed with surgery as scheduled.   Willeen Cass, FNP-BC [facility] Stay [facility]/Anesthesiology Phone: [phone] 01/15/2018 10:23 AM

## 2018-01-19 ENCOUNTER — Inpatient Hospital Stay (HOSPITAL_COMMUNITY): Payer: Medicare HMO | Admitting: Certified Registered Nurse Anesthetist

## 2018-01-19 ENCOUNTER — Inpatient Hospital Stay (HOSPITAL_COMMUNITY): Payer: Medicare HMO | Admitting: Emergency Medicine

## 2018-01-19 ENCOUNTER — Observation Stay (HOSPITAL_COMMUNITY)
Admission: RE | Admit: 2018-01-19 | Discharge: 2018-01-20 | Disposition: A | Discharge status: Home / Self Care | Payer: Medicare HMO | Source: Ambulatory Visit | Attending: Orthopaedic Surgery | Admitting: Orthopaedic Surgery

## 2018-01-19 ENCOUNTER — Encounter (HOSPITAL_COMMUNITY): Payer: Self-pay | Admitting: *Deleted

## 2018-01-19 ENCOUNTER — Inpatient Hospital Stay (HOSPITAL_COMMUNITY): Payer: Medicare HMO

## 2018-01-19 ENCOUNTER — Encounter (HOSPITAL_COMMUNITY)
Admission: RE | Disposition: A | Discharge status: Home / Self Care | Payer: Self-pay | Source: Ambulatory Visit | Attending: Orthopaedic Surgery

## 2018-01-19 DIAGNOSIS — Z7982 Long term (current) use of aspirin: Secondary | ICD-10-CM | POA: Diagnosis not present

## 2018-01-19 DIAGNOSIS — F17210 Nicotine dependence, cigarettes, uncomplicated: Secondary | ICD-10-CM | POA: Insufficient documentation

## 2018-01-19 DIAGNOSIS — M5126 Other intervertebral disc displacement, lumbar region: Secondary | ICD-10-CM | POA: Insufficient documentation

## 2018-01-19 DIAGNOSIS — I4892 Unspecified atrial flutter: Secondary | ICD-10-CM | POA: Diagnosis not present

## 2018-01-19 DIAGNOSIS — M48061 Spinal stenosis, lumbar region without neurogenic claudication: Principal | ICD-10-CM | POA: Diagnosis present

## 2018-01-19 DIAGNOSIS — I4891 Unspecified atrial fibrillation: Secondary | ICD-10-CM | POA: Diagnosis not present

## 2018-01-19 DIAGNOSIS — J439 Emphysema, unspecified: Secondary | ICD-10-CM | POA: Diagnosis not present

## 2018-01-19 DIAGNOSIS — Z85828 Personal history of other malignant neoplasm of skin: Secondary | ICD-10-CM | POA: Insufficient documentation

## 2018-01-19 DIAGNOSIS — E7800 Pure hypercholesterolemia, unspecified: Secondary | ICD-10-CM | POA: Diagnosis not present

## 2018-01-19 DIAGNOSIS — K219 Gastro-esophageal reflux disease without esophagitis: Secondary | ICD-10-CM | POA: Diagnosis not present

## 2018-01-19 DIAGNOSIS — Z8249 Family history of ischemic heart disease and other diseases of the circulatory system: Secondary | ICD-10-CM | POA: Insufficient documentation

## 2018-01-19 DIAGNOSIS — M1990 Unspecified osteoarthritis, unspecified site: Secondary | ICD-10-CM | POA: Insufficient documentation

## 2018-01-19 DIAGNOSIS — I2510 Atherosclerotic heart disease of native coronary artery without angina pectoris: Secondary | ICD-10-CM | POA: Diagnosis not present

## 2018-01-19 DIAGNOSIS — M48062 Spinal stenosis, lumbar region with neurogenic claudication: Secondary | ICD-10-CM | POA: Diagnosis not present

## 2018-01-19 DIAGNOSIS — F329 Major depressive disorder, single episode, unspecified: Secondary | ICD-10-CM | POA: Diagnosis not present

## 2018-01-19 DIAGNOSIS — F419 Anxiety disorder, unspecified: Secondary | ICD-10-CM | POA: Diagnosis not present

## 2018-01-19 DIAGNOSIS — I739 Peripheral vascular disease, unspecified: Secondary | ICD-10-CM | POA: Insufficient documentation

## 2018-01-19 DIAGNOSIS — Z9981 Dependence on supplemental oxygen: Secondary | ICD-10-CM | POA: Diagnosis not present

## 2018-01-19 DIAGNOSIS — Z79899 Other long term (current) drug therapy: Secondary | ICD-10-CM | POA: Insufficient documentation

## 2018-01-19 DIAGNOSIS — M810 Age-related osteoporosis without current pathological fracture: Secondary | ICD-10-CM | POA: Diagnosis not present

## 2018-01-19 DIAGNOSIS — Z419 Encounter for procedure for purposes other than remedying health state, unspecified: Secondary | ICD-10-CM

## 2018-01-19 HISTORY — PX: LUMBAR LAMINECTOMY/DECOMPRESSION MICRODISCECTOMY: SHX5026

## 2018-01-19 SURGERY — LUMBAR LAMINECTOMY/DECOMPRESSION MICRODISCECTOMY
Anesthesia: General

## 2018-01-19 MED ORDER — FENTANYL CITRATE (PF) 250 MCG/5ML IJ SOLN
INTRAMUSCULAR | Status: DC | PRN
  Administered 2018-01-19: 100 ug via INTRAVENOUS
  Administered 2018-01-19: 50 ug via INTRAVENOUS

## 2018-01-19 MED ORDER — ONDANSETRON HCL 4 MG/2ML IJ SOLN: 4.0000 mg | Freq: Four times a day (QID) | INTRAMUSCULAR | Status: DC | PRN

## 2018-01-19 MED ORDER — CYCLOSPORINE 0.05 % OP EMUL
1.0000 [drp] | Freq: Two times a day (BID) | OPHTHALMIC | Status: DC
  Administered 2018-01-19: 1 [drp] via OPHTHALMIC
  Filled 2018-01-19 (×2): qty 1

## 2018-01-19 MED ORDER — ATORVASTATIN CALCIUM 20 MG PO TABS
80.0000 mg | ORAL_TABLET | Freq: Every day | ORAL | Status: DC
  Administered 2018-01-19: 80 mg via ORAL
  Filled 2018-01-19: qty 4

## 2018-01-19 MED ORDER — HYDROCODONE-ACETAMINOPHEN 7.5-325 MG PO TABS
1.0000 | ORAL_TABLET | ORAL | Status: DC | PRN
  Administered 2018-01-19 – 2018-01-20 (×4): 1 via ORAL
  Filled 2018-01-19 (×4): qty 1

## 2018-01-19 MED ORDER — PHENYLEPHRINE HCL 10 MG/ML IJ SOLN
INTRAVENOUS | Status: DC | PRN
  Administered 2018-01-19: 20 ug/min via INTRAVENOUS

## 2018-01-19 MED ORDER — CEFAZOLIN SODIUM-DEXTROSE 2-4 GM/100ML-% IV SOLN
2.0000 g | INTRAVENOUS | Status: AC
  Administered 2018-01-19: 2 g via INTRAVENOUS

## 2018-01-19 MED ORDER — ACETAMINOPHEN 325 MG PO TABS
650.0000 mg | ORAL_TABLET | ORAL | Status: DC | PRN
Start: 2018-01-19 — End: 2018-01-20
  Administered 2018-01-19: 650 mg via ORAL

## 2018-01-19 MED ORDER — TRAZODONE HCL 50 MG PO TABS
50.0000 mg | ORAL_TABLET | Freq: Every day | ORAL | Status: DC
  Administered 2018-01-19: 50 mg via ORAL
  Filled 2018-01-19: qty 1

## 2018-01-19 MED ORDER — PROPOFOL 10 MG/ML IV BOLUS
INTRAVENOUS | Status: AC
  Filled 2018-01-19: qty 20

## 2018-01-19 MED ORDER — FENTANYL CITRATE (PF) 100 MCG/2ML IJ SOLN
25.0000 ug | INTRAMUSCULAR | Status: DC | PRN
  Administered 2018-01-19 (×3): 50 ug via INTRAVENOUS

## 2018-01-19 MED ORDER — FLUTICASONE-UMECLIDIN-VILANT 100-62.5-25 MCG/INH IN AEPB: 1.0000 | INHALATION_SPRAY | Freq: Every day | RESPIRATORY_TRACT | Status: DC

## 2018-01-19 MED ORDER — ONDANSETRON HCL 4 MG/2ML IJ SOLN
INTRAMUSCULAR | Status: DC | PRN
  Administered 2018-01-19: 4 mg via INTRAVENOUS

## 2018-01-19 MED ORDER — 0.9 % SODIUM CHLORIDE (POUR BTL) OPTIME
TOPICAL | Status: DC | PRN
  Administered 2018-01-19: 1000 mL

## 2018-01-19 MED ORDER — MENTHOL 3 MG MT LOZG: 1.0000 | LOZENGE | OROMUCOSAL | Status: DC | PRN

## 2018-01-19 MED ORDER — PROPOFOL 10 MG/ML IV BOLUS
INTRAVENOUS | Status: DC | PRN
  Administered 2018-01-19: 150 mg via INTRAVENOUS

## 2018-01-19 MED ORDER — SUGAMMADEX SODIUM 200 MG/2ML IV SOLN
INTRAVENOUS | Status: AC
  Filled 2018-01-19: qty 2

## 2018-01-19 MED ORDER — SODIUM CHLORIDE 0.9% FLUSH: 3.0000 mL | INTRAVENOUS | Status: DC | PRN

## 2018-01-19 MED ORDER — LIDOCAINE 2% (20 MG/ML) 5 ML SYRINGE
INTRAMUSCULAR | Status: DC | PRN
  Administered 2018-01-19: 60 mg via INTRAVENOUS

## 2018-01-19 MED ORDER — LACTATED RINGERS IV SOLN
INTRAVENOUS | Status: DC
  Administered 2018-01-19 (×2): via INTRAVENOUS

## 2018-01-19 MED ORDER — BUPIVACAINE HCL (PF) 0.25 % IJ SOLN
INTRAMUSCULAR | Status: AC
  Filled 2018-01-19: qty 30

## 2018-01-19 MED ORDER — DEXAMETHASONE SODIUM PHOSPHATE 10 MG/ML IJ SOLN
INTRAMUSCULAR | Status: AC
  Filled 2018-01-19: qty 1

## 2018-01-19 MED ORDER — OXYCODONE HCL 5 MG PO TABS
ORAL_TABLET | ORAL | Status: AC
  Administered 2018-01-19: 5 mg via ORAL
  Filled 2018-01-19: qty 1

## 2018-01-19 MED ORDER — AMIODARONE HCL 100 MG PO TABS
100.0000 mg | ORAL_TABLET | Freq: Every day | ORAL | Status: DC
  Filled 2018-01-19 (×2): qty 1

## 2018-01-19 MED ORDER — CHLORHEXIDINE GLUCONATE 4 % EX LIQD: 60.0000 mL | Freq: Once | CUTANEOUS | Status: DC

## 2018-01-19 MED ORDER — MIDAZOLAM HCL 2 MG/2ML IJ SOLN
INTRAMUSCULAR | Status: AC
  Filled 2018-01-19: qty 2

## 2018-01-19 MED ORDER — ACETAMINOPHEN 650 MG RE SUPP: 650.0000 mg | RECTAL | Status: DC | PRN

## 2018-01-19 MED ORDER — DULOXETINE HCL 30 MG PO CPEP
60.0000 mg | ORAL_CAPSULE | Freq: Every evening | ORAL | Status: DC
  Administered 2018-01-19: 60 mg via ORAL
  Filled 2018-01-19: qty 2

## 2018-01-19 MED ORDER — PHENYLEPHRINE 40 MCG/ML (10ML) SYRINGE FOR IV PUSH (FOR BLOOD PRESSURE SUPPORT)
PREFILLED_SYRINGE | INTRAVENOUS | Status: AC
  Filled 2018-01-19: qty 20

## 2018-01-19 MED ORDER — ROCURONIUM BROMIDE 10 MG/ML (PF) SYRINGE
PREFILLED_SYRINGE | INTRAVENOUS | Status: DC | PRN
  Administered 2018-01-19: 60 mg via INTRAVENOUS

## 2018-01-19 MED ORDER — BUPIVACAINE HCL (PF) 0.25 % IJ SOLN
INTRAMUSCULAR | Status: DC | PRN
  Administered 2018-01-19: 10 mL

## 2018-01-19 MED ORDER — ALPRAZOLAM 0.5 MG PO TABS
0.5000 mg | ORAL_TABLET | Freq: Two times a day (BID) | ORAL | Status: DC | PRN
  Administered 2018-01-19: 0.5 mg via ORAL
  Filled 2018-01-19: qty 1

## 2018-01-19 MED ORDER — UMECLIDINIUM-VILANTEROL 62.5-25 MCG/INH IN AEPB
1.0000 | INHALATION_SPRAY | Freq: Every day | RESPIRATORY_TRACT | Status: DC
  Filled 2018-01-19: qty 14

## 2018-01-19 MED ORDER — MIDAZOLAM HCL 2 MG/2ML IJ SOLN
INTRAMUSCULAR | Status: DC | PRN
  Administered 2018-01-19: 2 mg via INTRAVENOUS

## 2018-01-19 MED ORDER — BUDESONIDE 0.25 MG/2ML IN SUSP
0.2500 mg | Freq: Two times a day (BID) | RESPIRATORY_TRACT | Status: DC
  Filled 2018-01-19 (×2): qty 2

## 2018-01-19 MED ORDER — PHENOL 1.4 % MT LIQD: 1.0000 | OROMUCOSAL | Status: DC | PRN

## 2018-01-19 MED ORDER — FENTANYL CITRATE (PF) 250 MCG/5ML IJ SOLN
INTRAMUSCULAR | Status: AC
  Filled 2018-01-19: qty 5

## 2018-01-19 MED ORDER — DEXAMETHASONE SODIUM PHOSPHATE 10 MG/ML IJ SOLN
INTRAMUSCULAR | Status: DC | PRN
  Administered 2018-01-19: 10 mg via INTRAVENOUS

## 2018-01-19 MED ORDER — NICOTINE 21 MG/24HR TD PT24
21.0000 mg | MEDICATED_PATCH | Freq: Every day | TRANSDERMAL | Status: DC
  Administered 2018-01-19: 21 mg via TRANSDERMAL
  Filled 2018-01-19: qty 1

## 2018-01-19 MED ORDER — VITAMIN D 1000 UNITS PO TABS: 2000.0000 [IU] | ORAL_TABLET | Freq: Every day | ORAL | Status: DC

## 2018-01-19 MED ORDER — FUROSEMIDE 40 MG PO TABS: 40.0000 mg | ORAL_TABLET | Freq: Every day | ORAL | Status: DC

## 2018-01-19 MED ORDER — ALBUTEROL SULFATE (2.5 MG/3ML) 0.083% IN NEBU: 2.5000 mg | INHALATION_SOLUTION | Freq: Four times a day (QID) | RESPIRATORY_TRACT | Status: DC | PRN

## 2018-01-19 MED ORDER — OXYCODONE HCL 5 MG PO TABS
5.0000 mg | ORAL_TABLET | Freq: Once | ORAL | Status: AC | PRN
  Administered 2018-01-19: 5 mg via ORAL

## 2018-01-19 MED ORDER — LIDOCAINE 2% (20 MG/ML) 5 ML SYRINGE
INTRAMUSCULAR | Status: AC
  Filled 2018-01-19: qty 5

## 2018-01-19 MED ORDER — NITROGLYCERIN 0.4 MG SL SUBL: 0.4000 mg | SUBLINGUAL_TABLET | SUBLINGUAL | Status: DC | PRN

## 2018-01-19 MED ORDER — CEFAZOLIN SODIUM-DEXTROSE 2-4 GM/100ML-% IV SOLN
INTRAVENOUS | Status: AC
  Filled 2018-01-19: qty 100

## 2018-01-19 MED ORDER — SODIUM CHLORIDE 0.9 % IV SOLN
INTRAVENOUS | Status: DC
  Administered 2018-01-19: 17:00:00 via INTRAVENOUS

## 2018-01-19 MED ORDER — OXYCODONE HCL 5 MG/5ML PO SOLN: 5.0000 mg | Freq: Once | ORAL | Status: AC | PRN

## 2018-01-19 MED ORDER — ONDANSETRON HCL 4 MG PO TABS: 4.0000 mg | ORAL_TABLET | Freq: Four times a day (QID) | ORAL | Status: DC | PRN

## 2018-01-19 MED ORDER — FENTANYL CITRATE (PF) 100 MCG/2ML IJ SOLN
INTRAMUSCULAR | Status: AC
  Administered 2018-01-19: 50 ug via INTRAVENOUS
  Filled 2018-01-19: qty 2

## 2018-01-19 MED ORDER — ONDANSETRON HCL 4 MG/2ML IJ SOLN
INTRAMUSCULAR | Status: AC
  Filled 2018-01-19: qty 2

## 2018-01-19 MED ORDER — SODIUM CHLORIDE 0.9 % IV SOLN: 250.0000 mL | INTRAVENOUS | Status: DC

## 2018-01-19 MED ORDER — CEFAZOLIN SODIUM-DEXTROSE 1-4 GM/50ML-% IV SOLN
1.0000 g | Freq: Three times a day (TID) | INTRAVENOUS | Status: AC
  Administered 2018-01-19 – 2018-01-20 (×2): 1 g via INTRAVENOUS
  Filled 2018-01-19 (×2): qty 50

## 2018-01-19 MED ORDER — PHENYLEPHRINE 40 MCG/ML (10ML) SYRINGE FOR IV PUSH (FOR BLOOD PRESSURE SUPPORT)
PREFILLED_SYRINGE | INTRAVENOUS | Status: DC | PRN
  Administered 2018-01-19: 80 ug via INTRAVENOUS

## 2018-01-19 MED ORDER — SUGAMMADEX SODIUM 200 MG/2ML IV SOLN
INTRAVENOUS | Status: DC | PRN
  Administered 2018-01-19: 200 mg via INTRAVENOUS

## 2018-01-19 MED ORDER — METHOCARBAMOL 500 MG PO TABS
500.0000 mg | ORAL_TABLET | Freq: Four times a day (QID) | ORAL | Status: DC | PRN
  Administered 2018-01-19 – 2018-01-20 (×2): 500 mg via ORAL
  Filled 2018-01-19 (×2): qty 1

## 2018-01-19 MED ORDER — ALBUTEROL SULFATE HFA 108 (90 BASE) MCG/ACT IN AERS
INHALATION_SPRAY | RESPIRATORY_TRACT | Status: DC | PRN
  Administered 2018-01-19: 2 via RESPIRATORY_TRACT

## 2018-01-19 MED ORDER — ROPINIROLE HCL 0.5 MG PO TABS
0.7500 mg | ORAL_TABLET | Freq: Every day | ORAL | Status: DC
  Administered 2018-01-19: 0.75 mg via ORAL
  Filled 2018-01-19: qty 1

## 2018-01-19 MED ORDER — ONDANSETRON HCL 4 MG/2ML IJ SOLN: 4.0000 mg | Freq: Once | INTRAMUSCULAR | Status: DC | PRN

## 2018-01-19 MED ORDER — ALBUTEROL SULFATE HFA 108 (90 BASE) MCG/ACT IN AERS
INHALATION_SPRAY | RESPIRATORY_TRACT | Status: AC
  Filled 2018-01-19: qty 6.7

## 2018-01-19 MED ORDER — POLYETHYLENE GLYCOL 3350 17 G PO PACK: 17.0000 g | PACK | Freq: Every day | ORAL | Status: DC | PRN

## 2018-01-19 MED ORDER — ALBUTEROL SULFATE HFA 108 (90 BASE) MCG/ACT IN AERS: 2.0000 | INHALATION_SPRAY | Freq: Four times a day (QID) | RESPIRATORY_TRACT | Status: DC | PRN

## 2018-01-19 MED ORDER — PANTOPRAZOLE SODIUM 40 MG PO TBEC
40.0000 mg | DELAYED_RELEASE_TABLET | Freq: Every day | ORAL | Status: DC
  Administered 2018-01-19: 40 mg via ORAL
  Filled 2018-01-19: qty 1

## 2018-01-19 MED ORDER — SODIUM CHLORIDE 0.9% FLUSH
3.0000 mL | Freq: Two times a day (BID) | INTRAVENOUS | Status: DC
  Administered 2018-01-19: 3 mL via INTRAVENOUS

## 2018-01-19 MED ORDER — DOCUSATE SODIUM 100 MG PO CAPS
100.0000 mg | ORAL_CAPSULE | Freq: Two times a day (BID) | ORAL | Status: DC
  Administered 2018-01-19: 100 mg via ORAL
  Filled 2018-01-19: qty 1

## 2018-01-19 MED ORDER — ACETAMINOPHEN 325 MG PO TABS
ORAL_TABLET | ORAL | Status: AC
  Filled 2018-01-19: qty 2

## 2018-01-19 SURGICAL SUPPLY — 45 items
BUR ROUND FLUTED 4 SOFT TCH (BURR) ×2 IMPLANT
BUR ROUND FLUTED 4MM SOFT TCH (BURR) ×1
CANISTER SUCT 3000ML PPV (MISCELLANEOUS) ×3 IMPLANT
CLOSURE STERI-STRIP 1/2X4 (GAUZE/BANDAGES/DRESSINGS) ×1
CLSR STERI-STRIP ANTIMIC 1/2X4 (GAUZE/BANDAGES/DRESSINGS) ×2 IMPLANT
COVER SURGICAL LIGHT HANDLE (MISCELLANEOUS) ×3 IMPLANT
DECANTER SPIKE VIAL GLASS SM (MISCELLANEOUS) ×3 IMPLANT
DRAPE HALF SHEET 40X57 (DRAPES) ×6 IMPLANT
DRAPE MICROSCOPE LEICA (MISCELLANEOUS) ×3 IMPLANT
DRAPE SURG 17X23 STRL (DRAPES) ×3 IMPLANT
DRSG MEPILEX BORDER 4X4 (GAUZE/BANDAGES/DRESSINGS) ×3 IMPLANT
DURAPREP 26ML APPLICATOR (WOUND CARE) ×3 IMPLANT
ELECT BLADE 4.0 EZ CLEAN MEGAD (MISCELLANEOUS) ×3
ELECT REM PT RETURN 9FT ADLT (ELECTROSURGICAL) ×3
ELECTRODE BLDE 4.0 EZ CLN MEGD (MISCELLANEOUS) ×1 IMPLANT
ELECTRODE REM PT RTRN 9FT ADLT (ELECTROSURGICAL) ×1 IMPLANT
FLOSEAL 10ML (HEMOSTASIS) ×3 IMPLANT
GLOVE BIOGEL PI IND STRL 8 (GLOVE) ×2 IMPLANT
GLOVE BIOGEL PI INDICATOR 8 (GLOVE) ×4
GLOVE ORTHO TXT STRL SZ7.5 (GLOVE) ×6 IMPLANT
GOWN STRL REUS W/ TWL LRG LVL3 (GOWN DISPOSABLE) ×2 IMPLANT
GOWN STRL REUS W/ TWL XL LVL3 (GOWN DISPOSABLE) ×1 IMPLANT
GOWN STRL REUS W/TWL 2XL LVL3 (GOWN DISPOSABLE) ×3 IMPLANT
GOWN STRL REUS W/TWL LRG LVL3 (GOWN DISPOSABLE) ×4
GOWN STRL REUS W/TWL XL LVL3 (GOWN DISPOSABLE) ×2
KIT BASIN OR (CUSTOM PROCEDURE TRAY) ×3 IMPLANT
KIT ROOM TURNOVER OR (KITS) ×3 IMPLANT
MANIFOLD NEPTUNE II (INSTRUMENTS) ×3 IMPLANT
NEEDLE HYPO 25GX1X1/2 BEV (NEEDLE) ×3 IMPLANT
NEEDLE SPNL 18GX3.5 QUINCKE PK (NEEDLE) ×3 IMPLANT
NS IRRIG 1000ML POUR BTL (IV SOLUTION) ×3 IMPLANT
PACK LAMINECTOMY ORTHO (CUSTOM PROCEDURE TRAY) ×3 IMPLANT
PAD ARMBOARD 7.5X6 YLW CONV (MISCELLANEOUS) ×6 IMPLANT
PATTIES SURGICAL .5 X.5 (GAUZE/BANDAGES/DRESSINGS) IMPLANT
PATTIES SURGICAL .75X.75 (GAUZE/BANDAGES/DRESSINGS) IMPLANT
SUT VIC AB 0 CT1 27 (SUTURE) ×4
SUT VIC AB 0 CT1 27XBRD ANBCTR (SUTURE) ×2 IMPLANT
SUT VIC AB 1 CTX 36 (SUTURE) ×4
SUT VIC AB 1 CTX36XBRD ANBCTR (SUTURE) ×2 IMPLANT
SUT VIC AB 2-0 CT1 27 (SUTURE) ×4
SUT VIC AB 2-0 CT1 TAPERPNT 27 (SUTURE) ×2 IMPLANT
SUT VIC AB 3-0 X1 27 (SUTURE) ×3 IMPLANT
TOWEL OR 17X24 6PK STRL BLUE (TOWEL DISPOSABLE) ×3 IMPLANT
TOWEL OR 17X26 10 PK STRL BLUE (TOWEL DISPOSABLE) ×3 IMPLANT
YANKAUER SUCT BULB TIP NO VENT (SUCTIONS) ×3 IMPLANT

## 2018-01-19 NOTE — Anesthesia Preprocedure Evaluation (Addendum)
Anesthesia Evaluation  Patient identified by MRN, date of birth, ID band Patient awake    Reviewed: Allergy & Precautions, NPO status , Patient's Chart, lab work & pertinent test results  Airway Mallampati: III  TM Distance: >3 FB Neck ROM: Full    Dental  (+) Edentulous Lower, Edentulous Upper   Pulmonary COPD,  COPD inhaler and oxygen dependent, Current Smoker,    Pulmonary exam normal breath sounds clear to auscultation       Cardiovascular + CAD and + Peripheral Vascular Disease  Normal cardiovascular exam+ dysrhythmias Atrial Fibrillation  Rhythm:Regular Rate:Normal  '18 Perfusion scan -  Nuclear stress EF: 59%. No wall motion abnormalities. There was no ST segment deviation noted during stress. The study is normal. No perfusion defects identified. No ischemia. This is a low risk study.  EKG - Inc RBBB   Neuro/Psych Anxiety Depression negative neurological ROS     GI/Hepatic Neg liver ROS, hiatal hernia, GERD  Medicated and Controlled,  Endo/Other  Obesity  Renal/GU negative Renal ROS  negative genitourinary   Musculoskeletal  (+) Arthritis ,   Abdominal   Peds  Hematology negative hematology ROS (+)   Anesthesia Other Findings   Reproductive/Obstetrics                           Anesthesia Physical Anesthesia Plan  ASA: III  Anesthesia Plan: General   Post-op Pain Management:    Induction: Intravenous  PONV Risk Score and Plan: 3 and Treatment may vary due to age or medical condition, Ondansetron and Dexamethasone  Airway Management Planned: Oral ETT  Additional Equipment: None  Intra-op Plan:   Post-operative Plan: Extubation in OR  Informed Consent: I have reviewed the patients History and Physical, chart, labs and discussed the procedure including the risks, benefits and alternatives for the proposed anesthesia with the patient or authorized representative who has  indicated his/her understanding and acceptance.   Dental advisory given  Plan Discussed with: CRNA  Anesthesia Plan Comments:         Anesthesia Quick Evaluation

## 2018-01-19 NOTE — Op Note (Addendum)
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  Postop diagnosis: Same  Procedure: L4-5 decompression with partial facetectomy complete L4 laminectomy right L 4- 5 microdiscectomy.  Surgeon: Rodell Perna MD  Assistant: Benjiman Core PA-C medically necessary and present for the entire procedure  Anesthesia: General plus Marcaine local  EBL: See anesthetic record  Complications: None  Operative findings multifactorial spinal stenosis at L4-5 with right disc protrusion.  Procedure: After induction of general anesthesia preoperative Ancef prophylaxis timeout procedure with patient prone on chest rolls careful padding positioning, DuraPrep area squared with towel sterile skin marker Betadine Steri-Drape and laminectomy sheets and drapes a needle was placed and crosstable lateral x-ray was taken directly over the L4-5 disc based on palpable landmarks.  Midline incision was made subperiosteal dissection out over the lamina was performed self-retaining [place]  Retractor placed and Coker clamps were placed above and below expected level for decompression.  Second x-ray was taken after the blood Coker clamps were adjusted and bone was marked with purple marker.  Decompression was performed removing the lamina of L4 top portion of L5 where there was crystals related to previous epidural injections and scar tissue present.  The dura was carefully freed from the thick chunks of ligamentum and they were removed.  Partial facetectomy was removed going up to the L 4 pedicle.  There was disc protrusion on the right side and bipolar cautery was used to coagulate some veins in the lateral gutter.  Lateral recess decompression was performed on both sides and progression cephalad until there was epidural fat and no areas of stenosis of the central canal which was at the mid vertebral body level.  Top portion of L5 lamina was thinned with the bur and then removed with a Kerrison rongeur it carefully  protecting the dura with patties.  3 core retractor was used on the right side annulus was incised with a scalpel and micropituitary was used along with Epstein curettes to remove chunks of dura decompressing the prominent disc bulge on the right side that was displacing the nerve root on the right side.  Overhanging spurs from the facet were removed with partial facetectomy.  Nerve root was free on both sides central decompression was completed.  Some Surgi-Flo was used in the lateral gutters were removed repeated one time.  Gutters were dry and then standard layer closure with #1 Vicryl in the deep fascia to on the subcutaneous tissue subcuticular closure tincture benzoin Steri-Strips postop dressing and transferred to recovery room where she was neurologically intact.

## 2018-01-19 NOTE — Transfer of Care (Signed)
Immediate Anesthesia Transfer of Care Note  Patient: Tonya Bradley  Procedure(s) Performed: L4-5 DECOMPRESSION (N/A )  Patient Location: PACU  Anesthesia Type:General  Level of Consciousness: awake, alert  and oriented  Airway & Oxygen Therapy: Patient connected to nasal cannula oxygen  Post-op Assessment: Post -op Vital signs reviewed and stable  Post vital signs: stable  Last Vitals:  Vitals:   01/19/18 1049  BP: 136/62  Pulse: 73  Resp: 18  Temp: 36.5 C  SpO2: 94%    Last Pain:  Vitals:   01/19/18 1112  TempSrc:   PainSc: 5          Complications: No apparent anesthesia complications

## 2018-01-19 NOTE — Anesthesia Procedure Notes (Signed)
Procedure Name: Intubation Date/Time: 01/19/2018 12:45 PM Performed by: Valda Favia, CRNA Pre-anesthesia Checklist: Patient identified, Emergency Drugs available, Suction available, Patient being monitored and Timeout performed Patient Re-evaluated:Patient Re-evaluated prior to induction Oxygen Delivery Method: Circle system utilized Preoxygenation: Pre-oxygenation with 100% oxygen Induction Type: IV induction Ventilation: Mask ventilation without difficulty Laryngoscope Size: Mac and 4 Grade View: Grade I Tube type: Oral Tube size: 7.0 mm Number of attempts: 1 Airway Equipment and Method: Stylet Placement Confirmation: ETT inserted through vocal cords under direct vision,  positive ETCO2 and breath sounds checked- equal and bilateral Secured at: 21 cm Tube secured with: Tape Dental Injury: Teeth and Oropharynx as per pre-operative assessment

## 2018-01-19 NOTE — H&P (Addendum)
Tonya Bradley is an 67 y.o. female.   Chief Complaint: back pain and leg pain HPI: patient with hx of L4-5 stenosis and above complaint presents for surgical intervention.  Progressively worsening symptoms.  Failed conservative treatment.    Past Medical History:  Diagnosis Date  . Anemia   . Anxiety   . Arthritis    "back"  . Atrial flutter ([place]) 03/13/2016  . CAD (coronary artery disease)   . Cancer ([place])    skin cancer  . Cataract   . Colon polyp   . COPD (chronic obstructive pulmonary disease) ([place])   . Depression   . Dysrhythmia   . Emphysema of lung ([place])   . GERD (gastroesophageal reflux disease)   . Hypercholesterolemia   . Osteoporosis   . Oxygen deficiency     Past Surgical History:  Procedure Laterality Date  . A-FLUTTER ABLATION N/A 05/19/2017   Procedure: A-Flutter Ablation;  Surgeon: Evans Lance, MD;  Location: [facility];  Service: Cardiovascular;  Laterality: N/A;  . APPENDECTOMY    . BASAL CELL CARCINOMA EXCISION    . CARDIAC CATHETERIZATION    . CHOLECYSTECTOMY    . COLONOSCOPY N/A 06/23/2014   Dr. Rourk:Rectal and colonic polyps-removed, Pancolonic diverticulosis. lymphocytic colitis  . CORONARY ANGIOPLASTY WITH STENT PLACEMENT    . ESOPHAGEAL DILATION N/A 03/13/2016   Procedure: ESOPHAGEAL DILATION;  Surgeon: Daneil Dolin, MD;  [facility];  Service: Endoscopy;  Laterality: N/A;  . ESOPHAGOGASTRODUODENOSCOPY N/A 03/13/2016   Procedure: ESOPHAGOGASTRODUODENOSCOPY (EGD);  Surgeon: Daneil Dolin, MD;  Location: [facility];  Service: Endoscopy;  Laterality: N/A;  0930  . EYE SURGERY     cataract removed, bilateral    Family History  Problem Relation Age of Onset  . Breast cancer Sister 71  . Heart disease Sister   . Heart attack Father   . Heart disease Father   . Stroke Mother   . Heart disease Mother   . Hyperlipidemia Mother   . Heart attack Sister   . Heart disease Brother        ?heart failure  . Congestive Heart  Failure Brother   . Hypertension Son   . Heart disease Maternal Grandfather   . Cancer Cousin   . Hypertension Son   . Colon cancer Neg Hx    Social History:  reports that she has been smoking cigarettes.  She started smoking about 51 years ago. She has a 67.50 pack-year smoking history. she has never used smokeless tobacco. She reports that she does not drink alcohol or use drugs.  Allergies: No Known Allergies  No medications prior to admission.    No results found for this or any previous visit (from the past 48 hour(s)). No results found.  Review of Systems  Constitutional: Negative.   HENT: Negative.   Respiratory: Negative.   Gastrointestinal: Negative.   Genitourinary: Negative.   Musculoskeletal: Positive for back pain.  Skin: Negative.   Neurological: Positive for tingling.  Psychiatric/Behavioral: Negative.     There were no vitals taken for this visit. Physical Exam  Constitutional: She is oriented to person, place, and time. No distress.  HENT:  Head: Normocephalic and atraumatic.  Eyes: Pupils are equal, round, and reactive to light.  Neck: Normal range of motion.  Respiratory: No respiratory distress.  Musculoskeletal:  Ortho Exam normal hip range of motion negative straight leg raising. Anterior tib EHL is intact. No pedal edema distal pulses are palpable.  Neurological: She is alert and oriented to person, place, and time.  Skin: Skin is warm and dry.  Psychiatric: She has a normal mood and affect.     Assessment/Plan L3-4 stenosis  Will proceed with L4-5 decompression as scheduled.  Surgical procedure along with possible possible risks and complications discussed.  All questions answered and wishes to proceed.    Benjiman Core, PA-C 01/19/2018, 6:19 AM

## 2018-01-20 ENCOUNTER — Telehealth (INDEPENDENT_AMBULATORY_CARE_PROVIDER_SITE_OTHER): Payer: Self-pay | Admitting: Orthopaedic Surgery

## 2018-01-20 ENCOUNTER — Encounter (HOSPITAL_COMMUNITY): Payer: Self-pay | Admitting: Orthopaedic Surgery

## 2018-01-20 ENCOUNTER — Other Ambulatory Visit: Payer: Self-pay | Admitting: Family Medicine

## 2018-01-20 DIAGNOSIS — J439 Emphysema, unspecified: Secondary | ICD-10-CM | POA: Diagnosis not present

## 2018-01-20 DIAGNOSIS — F329 Major depressive disorder, single episode, unspecified: Secondary | ICD-10-CM | POA: Diagnosis not present

## 2018-01-20 DIAGNOSIS — I2510 Atherosclerotic heart disease of native coronary artery without angina pectoris: Secondary | ICD-10-CM | POA: Diagnosis not present

## 2018-01-20 DIAGNOSIS — E7800 Pure hypercholesterolemia, unspecified: Secondary | ICD-10-CM | POA: Diagnosis not present

## 2018-01-20 DIAGNOSIS — M48061 Spinal stenosis, lumbar region without neurogenic claudication: Secondary | ICD-10-CM | POA: Diagnosis not present

## 2018-01-20 DIAGNOSIS — K219 Gastro-esophageal reflux disease without esophagitis: Secondary | ICD-10-CM | POA: Diagnosis not present

## 2018-01-20 DIAGNOSIS — I4892 Unspecified atrial flutter: Secondary | ICD-10-CM | POA: Diagnosis not present

## 2018-01-20 DIAGNOSIS — I83813 Varicose veins of bilateral lower extremities with pain: Secondary | ICD-10-CM

## 2018-01-20 DIAGNOSIS — F419 Anxiety disorder, unspecified: Secondary | ICD-10-CM | POA: Diagnosis not present

## 2018-01-20 DIAGNOSIS — M5126 Other intervertebral disc displacement, lumbar region: Secondary | ICD-10-CM | POA: Diagnosis not present

## 2018-01-20 MED ORDER — METHOCARBAMOL 500 MG PO TABS: 500.0000 mg | ORAL_TABLET | Freq: Four times a day (QID) | ORAL | 0 refills | Status: DC | PRN

## 2018-01-20 MED ORDER — HYDROCODONE-ACETAMINOPHEN 7.5-325 MG PO TABS: 1.0000 | ORAL_TABLET | ORAL | 0 refills | Status: DC | PRN

## 2018-01-20 NOTE — Progress Notes (Signed)
Patient alert and oriented, mae's well, voiding adequate amount of urine, swallowing without difficulty, no c/o pain at time of discharge. Patient discharged home with family. Script and discharged instructions given to patient. Patient and family stated understanding of instructions given. Patient has an appointment with Dr. Yates  

## 2018-01-20 NOTE — Discharge Instructions (Signed)
Walk daily , slowly work up to  1 to 2 miles over a few weeks. Avoid sitting except for bathroom and meals. Recliner , lying down on couch is fine. OK to shower when you get home. Avoid bending , twisting lifting.

## 2018-01-20 NOTE — Anesthesia Postprocedure Evaluation (Signed)
Anesthesia Post Note  Patient: Tonya Bradley  Procedure(s) Performed: L4-5 DECOMPRESSION (N/A )     Patient location during evaluation: PACU Anesthesia Type: General Level of consciousness: awake and alert Pain management: pain level controlled Vital Signs Assessment: post-procedure vital signs reviewed and stable Respiratory status: spontaneous breathing, nonlabored ventilation and respiratory function stable Cardiovascular status: blood pressure returned to baseline and stable Postop Assessment: no apparent nausea or vomiting Anesthetic complications: no    Last Vitals:  Vitals:   01/19/18 2315 01/20/18 0330  BP: 95/60 (!) 108/55  Pulse: 70 72  Resp: 18 18  Temp: 36.6 C 36.6 C  SpO2: 97% 93%    Last Pain:  Vitals:   01/20/18 0556  TempSrc:   PainSc: Asleep   Pain Goal: Patients Stated Pain Goal: 3 (01/20/18 0457)               Audry Pili

## 2018-01-20 NOTE — Telephone Encounter (Signed)
Please advise 

## 2018-01-20 NOTE — Telephone Encounter (Signed)
Patients husband called on behalf of patient asking if she should continue wearing the compression stockings since she just had back surgery yesterday with Dr. Lorin Mercy. CB # [phone]

## 2018-01-20 NOTE — Telephone Encounter (Signed)
I called patient's husband and advised.

## 2018-01-20 NOTE — Telephone Encounter (Signed)
Yes if her feet and legs swell.if the do not then she can leave them off. thanks

## 2018-01-20 NOTE — Progress Notes (Signed)
   Subjective: 1 Day Post-Op Procedure(s) (LRB): L4-5 DECOMPRESSION (N/A) Patient reports pain as mild and moderate.  Incisional pain. Has walked times 2 in halls.   Objective: Vital signs in last 24 hours: Temp:  [97.6 F (36.4 C)-98.3 F (36.8 C)] 97.8 F (36.6 C) (02/19 0330) Pulse Rate:  [70-86] 72 (02/19 0330) Resp:  [18-22] 18 (02/19 0330) BP: (93-136)/(52-64) 108/55 (02/19 0330) SpO2:  [92 %-100 %] 93 % (02/19 0330)  Intake/Output from previous day: 02/18 0701 - 02/19 0700 In: 1992 [P.O.:942; I.V.:1000] Out: 50 [Blood:50] Intake/Output this shift: No intake/output data recorded.  No results for input(s): HGB in the last 72 hours. No results for input(s): WBC, RBC, HCT, PLT in the last 72 hours. No results for input(s): NA, K, CL, CO2, BUN, CREATININE, GLUCOSE, CALCIUM in the last 72 hours. No results for input(s): LABPT, INR in the last 72 hours.  Neurologically intact Dg Lumbar Spine 2-3 Views  Result Date: 01/19/2018 CLINICAL DATA:  Spinal stenosis at L4-5. EXAM: LUMBAR SPINE - 2-3 VIEW COMPARISON:  Lumbar MRI dated 08/21/2017 FINDINGS: Image 1 demonstrates a needle at the L4-5 level. Image 2 demonstrates instruments at L4-5 and L5-S1. Image 3 demonstrates instruments at the level of the pedicles of L4 and L5. IMPRESSION: Instruments at L4-5 and L5-S1 as described. Electronically Signed   By: Lorriane Shire M.D.   On: 01/19/2018 13:42    Assessment/Plan: 1 Day Post-Op Procedure(s) (LRB): L4-5 DECOMPRESSION (N/A) Plan: d/c home. Activities discussed.   Tonya Bradley 01/20/2018, 7:07 AM

## 2018-01-27 ENCOUNTER — Ambulatory Visit (INDEPENDENT_AMBULATORY_CARE_PROVIDER_SITE_OTHER): Payer: Medicare HMO | Admitting: Orthopaedic Surgery

## 2018-01-27 ENCOUNTER — Telehealth: Payer: Self-pay | Admitting: Family Medicine

## 2018-01-27 ENCOUNTER — Ambulatory Visit (INDEPENDENT_AMBULATORY_CARE_PROVIDER_SITE_OTHER): Payer: Medicare HMO

## 2018-01-27 ENCOUNTER — Encounter (INDEPENDENT_AMBULATORY_CARE_PROVIDER_SITE_OTHER): Payer: Self-pay | Admitting: Orthopaedic Surgery

## 2018-01-27 VITALS — BP 114/69 | HR 83

## 2018-01-27 DIAGNOSIS — M48062 Spinal stenosis, lumbar region with neurogenic claudication: Secondary | ICD-10-CM

## 2018-01-27 DIAGNOSIS — Z9889 Other specified postprocedural states: Secondary | ICD-10-CM

## 2018-01-27 NOTE — Telephone Encounter (Signed)
Copied from [place]. Topic: Quick Communication - See Telephone Encounter >> Jan 27, 2018 10:02 AM Neva Seat wrote: Ondansetron 4 mg - for 5 days  Pt is now out because she started taking two = 8 mg.  8 mg is the dosage that helped.  She is asking if she can get an increased dosage of refill.  [facility] [phone] [address], [address] AT [address] [place] [address] Phone: [phone] Fax: [phone]

## 2018-01-27 NOTE — Telephone Encounter (Signed)
Pt. Called for refill of Zofran - not on pt. Medication list. Left message asking if her surgeon ordered this. If he did, she needs to get refill from him. Instructed if she had any questions to call back.

## 2018-01-27 NOTE — Progress Notes (Signed)
Post-Op Visit Note   Patient: Tonya Bradley           Date of Birth: 1951/09/30           MRN: [id number] Visit Date: 01/27/2018 PCP: Martinique, [name], MD   Assessment & Plan:  Chief Complaint:  Chief Complaint  Patient presents with  . Lower Back - Routine Post Op   Visit Diagnoses:  1. Spinal stenosis of lumbar region with neurogenic claudication   2. Status post lumbar spine surgery for decompression of spinal cord     Plan: Pain medication renewed she is had problems with constipation after surgery.  Norco 5/325 number 40 tablets prescribed.  Follow-Up Instructions: Return in about 1 month (around 02/24/2018).   Orders:  Orders Placed This Encounter  Procedures  . XR Lumbar Spine 2-3 Views   No orders of the defined types were placed in this encounter.   Imaging: Xr Lumbar Spine 2-3 Views  Result Date: 01/27/2018 AP and lateral lumbar spine x-rays demonstrate laminectomy L4-5 level.  Old superior endplate fracture at L4.  Unchanged from preoperative images. Impression: Stable postop L4-5 decompression.  No new changes noted.  Disc space height remains unchanged.  No endplate changes.   PMFS History: Patient Active Problem List   Diagnosis Date Noted  . Lumbar stenosis 01/19/2018  . GERD (gastroesophageal reflux disease) 11/18/2017  . 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 COPD (chronic obstructive pulmonary disease) ([place]) 09/09/2017  . Varicose veins of bilateral lower extremities with other complications 65/01/5464  . Chronic pain disorder 05/26/2017  . Back pain 05/26/2017  . Insomnia 05/26/2017  . Osteopenia 11/26/2016  . Other emphysema ([place]) 11/07/2016  . Chronic anxiety 11/07/2016  . Chronic hypoxemic respiratory failure ([place]) 11/07/2016  . Constipation 06/12/2016  . Right carotid bruit 04/03/2016  . CAD (coronary artery disease) 04/03/2016  . Tobacco abuse 04/03/2016  . Atrial flutter ([place]) 03/13/2016  . Hiatal  hernia   . Reflux esophagitis   . Schatzki's ring   . Dysphagia 03/01/2016  . Solitary pulmonary nodule 03/01/2016  . 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 Microscopic colitis 08/15/2014  . Loose stools 06/08/2014  . BCC (basal cell carcinoma), face 09/13/2012   Past Medical History:  Diagnosis Date  . Anemia   . Anxiety   . Arthritis    "back"  . Atrial flutter ([place]) 03/13/2016  . CAD (coronary artery disease)   . Cancer ([place])    skin cancer  . Cataract   . Colon polyp   . COPD (chronic obstructive pulmonary disease) ([place])   . Depression   . Dysrhythmia   . Emphysema of lung ([place])   . GERD (gastroesophageal reflux disease)   . Hypercholesterolemia   . Osteoporosis   . Oxygen deficiency     Family History  Problem Relation Age of Onset  . Breast cancer Sister 63  . Heart disease Sister   . Heart attack Father   . Heart disease Father   . Stroke Mother   . Heart disease Mother   . Hyperlipidemia Mother   . Heart attack Sister   . Heart disease Brother        ?heart failure  . Congestive Heart Failure Brother   . Hypertension Son   . Heart disease Maternal Grandfather   . Cancer Cousin   . Hypertension Son   . Colon cancer Neg Hx     Past Surgical History:  Procedure Laterality Date  .  A-FLUTTER ABLATION N/A 05/19/2017   Procedure: A-Flutter Ablation;  Surgeon: Evans Lance, MD;  Location: [facility];  Service: Cardiovascular;  Laterality: N/A;  . APPENDECTOMY    . BASAL CELL CARCINOMA EXCISION    . CARDIAC CATHETERIZATION    . CHOLECYSTECTOMY    . COLONOSCOPY N/A 06/23/2014   Dr. Rourk:Rectal and colonic polyps-removed, Pancolonic diverticulosis. lymphocytic colitis  . CORONARY ANGIOPLASTY WITH STENT PLACEMENT    . ESOPHAGEAL DILATION N/A 03/13/2016   Procedure: ESOPHAGEAL DILATION;  Surgeon: Daneil Dolin, MD;  [facility];  Service: Endoscopy;  Laterality: N/A;  . ESOPHAGOGASTRODUODENOSCOPY N/A 03/13/2016   Procedure:  ESOPHAGOGASTRODUODENOSCOPY (EGD);  Surgeon: Daneil Dolin, MD;  [facility];  Service: Endoscopy;  Laterality: N/A;  0930  . EYE SURGERY     cataract removed, bilateral  . LUMBAR LAMINECTOMY/DECOMPRESSION MICRODISCECTOMY N/A 01/19/2018   Procedure: L4-5 DECOMPRESSION;  Surgeon: Marybelle Killings, MD;  Location: [facility];  Service: Orthopedics;  Laterality: N/A;   Social History   Occupational History  . 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 Smoking status: Current Every Day Smoker    Packs/day: 1.50    Years: 45.00    Pack years: 67.50    Types: Cigarettes    Start date: 06/14/1966  . Smokeless tobacco: Never Used  . Tobacco comment: 1-2 packs daily  Substance and Sexual Activity  . Alcohol use: No    Alcohol/week: 0.0 oz  . Drug use: No  . Sexual activity: Yes    Birth control/protection: Post-menopausal

## 2018-01-28 ENCOUNTER — Other Ambulatory Visit: Payer: Self-pay | Admitting: *Deleted

## 2018-01-28 MED ORDER — ONDANSETRON 4 MG PO TBDP: 4.0000 mg | ORAL_TABLET | Freq: Three times a day (TID) | ORAL | 1 refills | Status: DC | PRN

## 2018-01-28 NOTE — Telephone Encounter (Signed)
Rx sent to pharmacy   

## 2018-01-28 NOTE — Telephone Encounter (Signed)
Pt. Reports Dr. Martinique gave a refill on Zofran "just a few pills." Would like to know if she will refill this for her.

## 2018-01-28 NOTE — Telephone Encounter (Signed)
Re routing back to provider

## 2018-01-28 NOTE — Telephone Encounter (Signed)
Pt said she is currently out and her stomach is killing her. Please advise. She wants to know will this be refilled

## 2018-01-30 NOTE — Discharge Summary (Signed)
Patient ID: Tonya Bradley MRN: [id number] DOB/AGE: 07-Mar-1951 67 y.o.  Admit date: 01/19/2018 Discharge date: 01/30/2018  Admission Diagnoses:  Active Problems:   Lumbar stenosis   Discharge Diagnoses:  Active Problems:   Lumbar stenosis  status post Procedure(s): L4-5 DECOMPRESSION  Past Medical History:  Diagnosis Date  . Anemia   . Anxiety   . Arthritis    "back"  . Atrial flutter ([place]) 03/13/2016  . CAD (coronary artery disease)   . Cancer ([place])    skin cancer  . Cataract   . Colon polyp   . COPD (chronic obstructive pulmonary disease) ([place])   . Depression   . Dysrhythmia   . Emphysema of lung ([place])   . GERD (gastroesophageal reflux disease)   . Hypercholesterolemia   . Osteoporosis   . Oxygen deficiency     Surgeries: Procedure(s): L4-5 DECOMPRESSION on 01/19/2018   Consultants:   Discharged Condition: Improved  Hospital Course: Tonya Bradley is an 67 y.o. female who was admitted 01/19/2018 for operative treatment of lumbar stenosis. Patient failed conservative treatments (please see the history and physical for the specifics) and had severe unremitting pain that affects sleep, daily activities and work/hobbies. After pre-op clearance, the patient was taken to the operating room on 01/19/2018 and underwent  Procedure(s): L4-5 DECOMPRESSION.    Patient was given perioperative antibiotics:  Anti-infectives (From admission, onward)   Start     Dose/Rate Route Frequency Ordered Stop   01/19/18 2030  ceFAZolin (ANCEF) IVPB 1 g/50 mL premix     1 g 100 mL/hr over 30 Minutes Intravenous Every 8 hours 01/19/18 1504 01/20/18 0600   01/19/18 1054  ceFAZolin (ANCEF) 2-4 GM/100ML-% IVPB    Comments:  Ardine Eng   : cabinet override      01/19/18 1054 01/19/18 1252   01/19/18 1047  ceFAZolin (ANCEF) IVPB 2g/100 mL premix     2 g 200 mL/hr over 30 Minutes Intravenous On call to O.R. 01/19/18 1047 01/19/18 1252       Patient was given sequential compression  devices and early ambulation to prevent DVT.   Patient benefited maximally from hospital stay and there were no complications. At the time of discharge, the patient was urinating/moving their bowels without difficulty, tolerating a regular diet, pain is controlled with oral pain medications and they have been cleared by PT/OT.   Recent vital signs: No data found.   Recent laboratory studies: No results for input(s): WBC, HGB, HCT, PLT, NA, K, CL, CO2, BUN, CREATININE, GLUCOSE, INR, CALCIUM in the last 72 hours.  Invalid input(s): PT, 2   Discharge Medications:   Allergies as of 01/20/2018   No Known Allergies     Medication List    TAKE these medications   ALPRAZolam 0.5 MG tablet Commonly known as:  XANAX Take 1 tablet (0.5 mg total) by mouth 2 (two) times daily as needed for anxiety (no more than 40 tabs per month.).   amiodarone 200 MG tablet Commonly known as:  PACERONE Take 1/2 tablet (100 mg) by mouth once daily What changed:    how much to take  how to take this  when to take this  additional instructions   aspirin EC 81 MG tablet Take 81 mg by mouth daily.   atorvastatin 80 MG tablet Commonly known as:  LIPITOR Take 1 tablet (80 mg total) by mouth daily.   budesonide-formoterol 160-4.5 MCG/ACT inhaler Commonly known as:  SYMBICORT Inhale 2 puffs into the lungs  2 (two) times daily.   cycloSPORINE 0.05 % ophthalmic emulsion Commonly known as:  RESTASIS Place 1 drop into both eyes 2 (two) times daily.   DULoxetine 60 MG capsule Commonly known as:  CYMBALTA TAKE 1 CAPSULE (60 MG TOTAL) BY MOUTH DAILY. What changed:  when to take this   Fluticasone-Umeclidin-Vilant 100-62.5-25 MCG/INH Aepb Commonly known as:  TRELEGY ELLIPTA Inhale 1 puff into the lungs daily.   HYDROcodone-acetaminophen 7.5-325 MG tablet Commonly known as:  NORCO Take 1 tablet by mouth every 4 (four) hours as needed for moderate pain ((score 4 to 6)).   ibuprofen 200 MG  tablet Commonly known as:  ADVIL,MOTRIN Take 400-600 mg by mouth every 8 (eight) hours as needed for headache or mild pain (2-3 tablets depending on pain).   linaclotide 72 MCG capsule Commonly known as:  LINZESS Take 1 capsule (72 mcg total) by mouth daily before breakfast.   methocarbamol 500 MG tablet Commonly known as:  ROBAXIN Take 1 tablet (500 mg total) by mouth every 6 (six) hours as needed for muscle spasms.   multivitamin tablet Take 1 tablet by mouth daily.   nitroGLYCERIN 0.4 MG SL tablet Commonly known as:  NITROSTAT Place 0.4 mg under the tongue every 5 (five) minutes as needed for chest pain. Reported on 06/12/2016   pantoprazole 40 MG tablet Commonly known as:  PROTONIX TAKE 1 TABLET EVERY DAY   rOPINIRole 0.25 MG tablet Commonly known as:  REQUIP TAKE 3 TABLETS AT BEDTIME   traZODone 50 MG tablet Commonly known as:  DESYREL Take 1 tablet (50 mg total) by mouth at bedtime as needed for sleep. What changed:  when to take this   VENTOLIN HFA 108 (90 Base) MCG/ACT inhaler Generic drug:  albuterol INHALE 2 PUFFS INTO THE LUNGS EVERY 6 HOURS AS NEEDED FOR WHEEZING OR SHORTNESS OF BREATH   Vitamin D 2000 units Caps Take 2,000 Units by mouth daily.     ASK your doctor about these medications   DEXILANT 30 MG capsule Generic drug:  Dexlansoprazole TAKE 1 CAPSULE(30 MG) BY MOUTH DAILY Ask about: Which instructions should I use?   furosemide 20 MG tablet Commonly known as:  LASIX TAKE 1 TABLET(20 MG) BY MOUTH TWICE DAILY Ask about: Which instructions should I use?       Diagnostic Studies: Dg Chest 2 View  Result Date: 01/14/2018 CLINICAL DATA:  Preoperative evaluation for lumbar spine surgery, smoker, COPD, coronary artery disease, atrial flutter, GERD EXAM: CHEST  2 VIEW COMPARISON:  08/27/2017 FINDINGS: Normal heart size, mediastinal contours, and pulmonary vascularity. Atherosclerotic calcification aorta. Lungs appear hyperinflated with minimal chronic  peribronchial thickening and asymmetric RIGHT apical scarring stable. No acute infiltrate, pleural effusion, or pneumothorax. Bones diffusely demineralized. IMPRESSION: Chronic lung changes. No acute abnormalities. Electronically Signed   By: Lavonia Dana M.D.   On: 01/14/2018 17:03   Dg Lumbar Spine 2-3 Views  Result Date: 01/19/2018 CLINICAL DATA:  Spinal stenosis at L4-5. EXAM: LUMBAR SPINE - 2-3 VIEW COMPARISON:  Lumbar MRI dated 08/21/2017 FINDINGS: Image 1 demonstrates a needle at the L4-5 level. Image 2 demonstrates instruments at L4-5 and L5-S1. Image 3 demonstrates instruments at the level of the pedicles of L4 and L5. IMPRESSION: Instruments at L4-5 and L5-S1 as described. Electronically Signed   By: Lorriane Shire M.D.   On: 01/19/2018 13:42   Xr Lumbar Spine 2-3 Views  Result Date: 01/27/2018 AP and lateral lumbar spine x-rays demonstrate laminectomy L4-5 level.  Old superior endplate fracture  at L4.  Unchanged from preoperative images. Impression: Stable postop L4-5 decompression.  No new changes noted.  Disc space height remains unchanged.  No endplate changes.     Follow-up Information    Lanae Crumbly, PA-C Follow up in 1 week(s).   Specialties:  Physician Assistant, Orthopedic Surgery Contact information: [place] [address] [phone]           Discharge Plan:  discharge to home  Disposition:     Signed: Benjiman Core  01/30/2018, 4:52 PM

## 2018-02-03 ENCOUNTER — Other Ambulatory Visit: Payer: Self-pay | Admitting: Emergency Medicine

## 2018-02-03 ENCOUNTER — Ambulatory Visit (INDEPENDENT_AMBULATORY_CARE_PROVIDER_SITE_OTHER): Payer: Medicare HMO | Admitting: Emergency Medicine

## 2018-02-03 ENCOUNTER — Encounter: Payer: Self-pay | Admitting: Emergency Medicine

## 2018-02-03 VITALS — BP 108/64 | HR 86 | Ht 62.0 in | Wt 166.0 lb

## 2018-02-03 DIAGNOSIS — J438 Other emphysema: Secondary | ICD-10-CM

## 2018-02-03 DIAGNOSIS — J9611 Chronic respiratory failure with hypoxia: Secondary | ICD-10-CM

## 2018-02-03 DIAGNOSIS — J439 Emphysema, unspecified: Secondary | ICD-10-CM

## 2018-02-03 LAB — PULMONARY FUNCTION TEST
DL/VA % pred: 98 %
DL/VA: 4.45 ml/min/mmHg/L
DLCO cor % pred: 65 %
DLCO cor: 14.03 ml/min/mmHg
DLCO unc % pred: 65 %
DLCO unc: 14.07 ml/min/mmHg
FEF 25-75 Post: 1.26 L/sec
FEF 25-75 Pre: 1.95 L/sec
FEF2575-%Change-Post: -35 %
FEF2575-%Pred-Post: 66 %
FEF2575-%Pred-Pre: 103 %
FEV1-%Change-Post: -5 %
FEV1-%Pred-Post: 70 %
FEV1-%Pred-Pre: 74 %
FEV1-Post: 1.51 L
FEV1-Pre: 1.59 L
FEV1FVC-%Change-Post: -1 %
FEV1FVC-%Pred-Pre: 108 %
FEV6-%Change-Post: -4 %
FEV6-%Pred-Post: 68 %
FEV6-%Pred-Pre: 71 %
FEV6-Post: 1.85 L
FEV6-Pre: 1.93 L
FEV6FVC-%Pred-Post: 104 %
FEV6FVC-%Pred-Pre: 104 %
FVC-%Change-Post: -4 %
FVC-%Pred-Post: 65 %
FVC-%Pred-Pre: 68 %
FVC-Post: 1.85 L
FVC-Pre: 1.93 L
Post FEV1/FVC ratio: 82 %
Post FEV6/FVC ratio: 100 %
Pre FEV1/FVC ratio: 82 %
Pre FEV6/FVC Ratio: 100 %
RV % pred: 141 %
RV: 2.9 L
TLC % pred: 101 %
TLC: 4.84 L

## 2018-02-03 NOTE — Assessment & Plan Note (Addendum)
Discussed cessation with her today.  She is not really related to stop currently.  I did talk to her about the potential benefits of stopping or even decreasing.  She is going to consider this.  We will revisit She will continue to participate in the low-dose CT scan lung cancer screening trial

## 2018-02-03 NOTE — Progress Notes (Signed)
Subjective:    Patient ID: Kym Groom, female    DOB: 1951-06-27, 67 y.o.   MRN: [id number]  HPI 67 year old active smoker (39 pack years) with a history of CAD, atrial flutter status post ablation with subsequent atrial fibrillation on amiodarone, GERD.  She has COPD that was diagnosed over 15 yrs ago by her PCP.  She is currently managed with Symbicort 160/4.5, not sure that it helps her.   She has documented walking hypoxemia here and also did at OV with Dr Lovena Le recently. Not always associated w dyspnea. She fall asleep easily, dozes during the day. Fall asleep watching TV. She snores loudly. She had a negative PSG in 2003. She hears wheeze. She has had a cough, worse recent but always present in the am.  Usually dry cough.   CXR 08/27/17 >>  no infiltrates.  LDCT 04/03/17 >> RADS 1, biapical scarring, no mass or nodules.   ROV 02/03/18 --follow-up visit for patient with a history of tobacco use, newly identified chronic hypoxemia.  Also with atrial fibrillation/flutter on amiodarone.  At our initial visit we did a trial of changing her Symbicort to Trelegy to see if she would benefit. She is using 2L/min w exertion and while sleeping. She believes that the trelegy has helped her breathing some. She underwent back sgy since our last visit - is not back to speed yet, has been resting a lot. She is not having a lot of cough or wheeze. She is smoking   She underwent pulmonary function testing today that I have reviewed.  This shows spirometry with mixed obstructive and restrictive pattern, hyperinflated lung volumes, decreased diffusion capacity that corrects to the normal range when adjusted for alveolar volume.    Review of Systems  Constitutional: Negative for fever and unexpected weight change.  HENT: Negative for congestion, dental problem, ear pain, nosebleeds, postnasal drip, rhinorrhea, sinus pressure, sneezing, sore throat and trouble swallowing.   Eyes: Negative for redness and  itching.  Respiratory: Positive for cough and shortness of breath. Negative for chest tightness and wheezing.   Cardiovascular: Negative for palpitations and leg swelling.  Gastrointestinal: Negative for nausea and vomiting.  Genitourinary: Negative for dysuria.  Musculoskeletal: Negative for joint swelling.  Skin: Negative for rash.  Neurological: Negative for headaches.  Hematological: Does not bruise/bleed easily.  Psychiatric/Behavioral: Negative for dysphoric mood. The patient is not nervous/anxious.    Past Medical History:  Diagnosis Date  . Anemia   . Anxiety   . Arthritis    "back"  . Atrial flutter ([place]) 03/13/2016  . CAD (coronary artery disease)   . Cancer ([place])    skin cancer  . Cataract   . Colon polyp   . COPD (chronic obstructive pulmonary disease) ([place])   . Depression   . Dysrhythmia   . Emphysema of lung ([place])   . GERD (gastroesophageal reflux disease)   . Hypercholesterolemia   . Osteoporosis   . Oxygen deficiency      Family History  Problem Relation Age of Onset  . Breast cancer Sister 62  . Heart disease Sister   . Heart attack Father   . Heart disease Father   . Stroke Mother   . Heart disease Mother   . Hyperlipidemia Mother   . Heart attack Sister   . Heart disease Brother        ?heart failure  . Congestive Heart Failure Brother   . Hypertension Son   . Heart disease Maternal Grandfather   .  Cancer Cousin   . Hypertension Son   . Colon cancer Neg Hx      Social History   Socioeconomic History  . Marital status: Married    Spouse name: Not on file  . Number of children: 3  . Years of education: 10  . Highest education level: Not on file  Social Needs  . Financial resource strain: Not on file  . Food insecurity - worry: Not on file  . Food insecurity - inability: Not on file  . Transportation needs - medical: Not on file  . Transportation needs - non-medical: Not on file  Occupational History  . 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 Smoking status: Current Every Day Smoker    Packs/day: 1.50    Years: 45.00    Pack years: 67.50    Types: Cigarettes    Start date: 06/14/1966  . Smokeless tobacco: Never Used  . Tobacco comment: 1-2 packs daily  Substance and Sexual Activity  . Alcohol use: No    Alcohol/week: 0.0 oz  . Drug use: No  . Sexual activity: Yes    Birth control/protection: Post-menopausal  Other Topics Concern  . Not on file  Social History Narrative   Quit high school and got married in 10th grade, age 68   First husband died of colon cancer at young age   Married again 2018   Lives in own home   Daughter lives with her   Drinks coffee seldom, too much "sweet tea"     No Known Allergies   Outpatient Medications Prior to Visit  Medication Sig Dispense Refill  . ALPRAZolam (XANAX) 0.5 MG tablet Take 1 tablet (0.5 mg total) by mouth 2 (two) times daily as needed for anxiety (no more than 40 tabs per month.). 40 tablet 2  . amiodarone (PACERONE) 200 MG tablet Take 1/2 tablet (100 mg) by mouth once daily (Patient taking differently: Take 100 mg by mouth daily. Take 1/2 tablet (100 mg) by mouth once daily) 90 tablet 3  . aspirin EC 81 MG tablet Take 81 mg by mouth daily.    Marland Kitchen atorvastatin (LIPITOR) 80 MG tablet Take 1 tablet (80 mg total) by mouth daily. 90 tablet 1  . budesonide-formoterol (SYMBICORT) 160-4.5 MCG/ACT inhaler Inhale 2 puffs into the lungs 2 (two) times daily. 1 Inhaler 3  . Cholecalciferol (VITAMIN D) 2000 units CAPS Take 2,000 Units by mouth daily.    . cycloSPORINE (RESTASIS) 0.05 % ophthalmic emulsion Place 1 drop into both eyes 2 (two) times daily.    Marland Kitchen DEXILANT 30 MG capsule TAKE 1 CAPSULE(30 MG) BY MOUTH DAILY 30 capsule 0  . DULoxetine (CYMBALTA) 60 MG capsule TAKE 1 CAPSULE (60 MG TOTAL) BY MOUTH DAILY. (Patient taking differently: Take 60 mg by mouth every evening. ) 90 capsule 1  . Fluticasone-Umeclidin-Vilant (TRELEGY  ELLIPTA) 100-62.5-25 MCG/INH AEPB Inhale 1 puff into the lungs daily. 28 each 0  . furosemide (LASIX) 20 MG tablet TAKE 1 TABLET(20 MG) BY MOUTH TWICE DAILY 60 tablet 0  . HYDROcodone-acetaminophen (NORCO) 7.5-325 MG tablet Take 1 tablet by mouth every 4 (four) hours as needed for moderate pain ((score 4 to 6)). 40 tablet 0  . ibuprofen (ADVIL,MOTRIN) 200 MG tablet Take 400-600 mg by mouth every 8 (eight) hours as needed for headache or mild pain (2-3 tablets depending on pain).    Marland Kitchen linaclotide (LINZESS) 72 MCG capsule Take 1 capsule (72 mcg total)  by mouth daily before breakfast. 30 capsule 1  . methocarbamol (ROBAXIN) 500 MG tablet Take 1 tablet (500 mg total) by mouth every 6 (six) hours as needed for muscle spasms. 30 tablet 0  . Multiple Vitamin (MULTIVITAMIN) tablet Take 1 tablet by mouth daily.    . nitroGLYCERIN (NITROSTAT) 0.4 MG SL tablet Place 0.4 mg under the tongue every 5 (five) minutes as needed for chest pain. Reported on 06/12/2016    . ondansetron (ZOFRAN ODT) 4 MG disintegrating tablet Take 1 tablet (4 mg total) by mouth every 8 (eight) hours as needed for nausea or vomiting. 20 tablet 1  . pantoprazole (PROTONIX) 40 MG tablet TAKE 1 TABLET EVERY DAY 90 tablet 0  . rOPINIRole (REQUIP) 0.25 MG tablet TAKE 3 TABLETS AT BEDTIME 90 tablet 1  . traZODone (DESYREL) 50 MG tablet Take 1 tablet (50 mg total) by mouth at bedtime as needed for sleep. (Patient taking differently: Take 50 mg by mouth at bedtime. ) 90 tablet 1  . VENTOLIN HFA 108 (90 Base) MCG/ACT inhaler INHALE 2 PUFFS INTO THE LUNGS EVERY 6 HOURS AS NEEDED FOR WHEEZING OR SHORTNESS OF BREATH 18 g 0   No facility-administered medications prior to visit.         Objective:   Physical Exam Vitals:   02/03/18 1342  BP: 108/64  Pulse: 86  SpO2: 91%  Weight: 166 lb (75.3 kg)  Height: 5\' 2"  (1.575 m)   Gen: Pleasant, well-nourished, in no distress,  normal affect  ENT: No lesions,  mouth clear,  oropharynx clear, no  postnasal drip  Neck: No JVD, no stridor  Lungs: No use of accessory muscles, distant, no wheeze  Cardiovascular: RRR, heart sounds normal, no murmur or gallops, no peripheral edema  Musculoskeletal: No deformities, no cyanosis or clubbing  Neuro: alert, non focal  Skin: Warm, no lesions or rash     Assessment & Plan:  Tobacco abuse Discussed cessation with her today.  She is not really related to stop currently.  I did talk to her about the potential benefits of stopping or even decreasing.  She is going to consider this.  We will revisit She will continue to participate in the low-dose CT scan lung cancer screening trial  COPD (chronic obstructive pulmonary disease) (HCC) Moderate to severe obstructive lung disease.  Continues to smoke.  She did benefit from the change to Trelegy.  We will continue this.  Keep albuterol available.  Chronic hypoxemic respiratory failure (HCC) Exertional hypoxemia documented.  She is using 2 L/min.  She also has nocturnal hypoxemia, tells me that she has had sleep studies before that did not show sleep apnea.  I believe she needs an ONO on 2 L/min to ensure that this is adequate.  I will up titrate her if she desaturates.  Baltazar Apo, MD, PhD 02/03/2018, 2:09 PM [facility] [phone] or if no answer [phone]

## 2018-02-03 NOTE — Progress Notes (Signed)
Patient performed full PFT today.

## 2018-02-03 NOTE — Assessment & Plan Note (Signed)
Exertional hypoxemia documented.  She is using 2 L/min.  She also has nocturnal hypoxemia, tells me that she has had sleep studies before that did not show sleep apnea.  I believe she needs an ONO on 2 L/min to ensure that this is adequate.  I will up titrate her if she desaturates.

## 2018-02-03 NOTE — Assessment & Plan Note (Signed)
Moderate to severe obstructive lung disease.  Continues to smoke.  She did benefit from the change to Trelegy.  We will continue this.  Keep albuterol available.

## 2018-02-03 NOTE — Patient Instructions (Addendum)
Please continue Trelegy once a day. Remember to rinse and gargle after using.  Take albuterol 2 puffs up to every 4 hours if needed for shortness of breath.  Continue to use your oxygen at 2L/min with exertion We will perform an overnight oximetry on 2L/min oxygen to insure that this is adequate.  You need to work on decreasing your cigarettes if at all possible.  Follow with Dr Lamonte Sakai in 6 months or sooner if you have any problems

## 2018-02-04 DIAGNOSIS — J449 Chronic obstructive pulmonary disease, unspecified: Secondary | ICD-10-CM | POA: Diagnosis not present

## 2018-02-06 ENCOUNTER — Telehealth: Payer: Self-pay | Admitting: Emergency Medicine

## 2018-02-06 MED ORDER — FLUTICASONE-UMECLIDIN-VILANT 100-62.5-25 MCG/INH IN AEPB: 1.0000 | INHALATION_SPRAY | Freq: Every day | RESPIRATORY_TRACT | 5 refills | Status: DC

## 2018-02-06 NOTE — Telephone Encounter (Signed)
lmtcb for pt. rx sent to pharmacy listed d/t being late Friday afternoon.

## 2018-02-09 ENCOUNTER — Telehealth (INDEPENDENT_AMBULATORY_CARE_PROVIDER_SITE_OTHER): Payer: Self-pay | Admitting: Orthopaedic Surgery

## 2018-02-09 NOTE — Telephone Encounter (Signed)
Please advise 

## 2018-02-09 NOTE — Telephone Encounter (Signed)
Received voicemail message from patient's husband Delfino Lovett) needing Rx for pain medicine refilled. The number to contact patient's husband is [phone]

## 2018-02-09 NOTE — Telephone Encounter (Signed)
Spoke with pt, she received her Trelegy. Nothing further is needed.

## 2018-02-09 NOTE — Telephone Encounter (Signed)
[facility]. Change to ultram # 20 1 po bid prn pain thanks

## 2018-02-10 ENCOUNTER — Telehealth: Payer: Self-pay | Admitting: Emergency Medicine

## 2018-02-10 MED ORDER — TRAMADOL HCL 50 MG PO TABS: 50.0000 mg | ORAL_TABLET | Freq: Two times a day (BID) | ORAL | 0 refills | Status: DC | PRN

## 2018-02-10 NOTE — Telephone Encounter (Signed)
Spoke with patient's husband. He stated that the patient was seen by RB on 02/03/18 and was advised that an order for an ONO would be placed. Per the patient's chart, the order was placed and sent to [facility]. Advised him that I would call [facility] and check on the status of the order.   Spoke with Alyse Low and Nicolaus at [facility]. Per [facility], they never received the order. Request that the order be faxed again.   Spoke with Vallarie Mare. She was the one who sent the order. She called [facility] and spoke with Estill Bamberg. Estill Bamberg remembers printing the order last week. She will print it again and give it to [facility] so that the patient can be scheduled.   Spoke with patient's husband again, advised him that [facility] does indeed have the order and will call them shortly to get this scheduled. Nothing else needed at time of call.

## 2018-02-10 NOTE — Telephone Encounter (Signed)
Patient called to check on this, states pharmacy has not received the RX. She uses [facility] on [address].

## 2018-02-10 NOTE — Telephone Encounter (Signed)
Called to pharmacy. I called patient's husband and advised.

## 2018-02-12 ENCOUNTER — Telehealth: Payer: Self-pay | Admitting: Emergency Medicine

## 2018-02-12 NOTE — Telephone Encounter (Signed)
Spoke with pt. She states that she did her ONO last night. Reports that she was given a form from [facility] telling her that it should be done on room air. Per the ONO order, pt was to do the test on 2L. I contacted [facility] and spoke with [place]. The pt was instructed to use her oxygen when the system was dropped off with her. Marita [place] is going to contact the pt and tell her keep the system and do the test again tonight with her oxygen. Nothing further was needed.

## 2018-02-13 ENCOUNTER — Encounter: Payer: Self-pay | Admitting: Emergency Medicine

## 2018-02-16 ENCOUNTER — Ambulatory Visit (INDEPENDENT_AMBULATORY_CARE_PROVIDER_SITE_OTHER): Payer: Medicare HMO | Admitting: Family Medicine

## 2018-02-16 ENCOUNTER — Other Ambulatory Visit: Payer: Self-pay | Admitting: Family Medicine

## 2018-02-16 ENCOUNTER — Encounter: Payer: Self-pay | Admitting: Family Medicine

## 2018-02-16 ENCOUNTER — Encounter: Payer: Self-pay | Admitting: Physician Assistant

## 2018-02-16 VITALS — BP 125/70 | HR 79 | Temp 98.5°F | Ht 62.0 in | Wt 161.0 lb

## 2018-02-16 DIAGNOSIS — K219 Gastro-esophageal reflux disease without esophagitis: Secondary | ICD-10-CM | POA: Diagnosis not present

## 2018-02-16 DIAGNOSIS — R110 Nausea: Secondary | ICD-10-CM

## 2018-02-16 DIAGNOSIS — R600 Localized edema: Secondary | ICD-10-CM | POA: Diagnosis not present

## 2018-02-16 DIAGNOSIS — F419 Anxiety disorder, unspecified: Secondary | ICD-10-CM | POA: Diagnosis not present

## 2018-02-16 DIAGNOSIS — G4700 Insomnia, unspecified: Secondary | ICD-10-CM

## 2018-02-16 DIAGNOSIS — I83813 Varicose veins of bilateral lower extremities with pain: Secondary | ICD-10-CM

## 2018-02-16 DIAGNOSIS — R197 Diarrhea, unspecified: Secondary | ICD-10-CM | POA: Diagnosis not present

## 2018-02-16 DIAGNOSIS — E876 Hypokalemia: Secondary | ICD-10-CM | POA: Diagnosis not present

## 2018-02-16 LAB — POTASSIUM: Potassium: 3.9 mEq/L (ref 3.5–5.1)

## 2018-02-16 NOTE — Progress Notes (Signed)
HPI:   [name] is a 67 y.o. female, who is here today for 3 months follow up.   She was seen on 01/12/2018 for acute visit. Since her last visit she has seen pulmonology, Dr. Lamonte Bradley.  Anxiety: Currently she is on Cymbalta 60 mg daily and Xanax 0.5 mg twice daily as needed. She denies depressed mood or suicidal thoughts. She is asking to increase dose of Xanax.  Tolerating medication well, no side effects reported.   Insomnia: She is on Trazodone 50 mg daily as needed. Sleeping about 6 hours  She has tolerated medication , she feels rested next day.     C/O LE edema, which is a chronic problem. She is on Furosemide 20 mg bid. Edema is worse at the end of the day and some days is worse than others. She denies erythema, she has some achy pain at night. Frustrated because shoes do not fit.   Lab Results  Component Value Date   CREATININE 0.84 01/14/2018   BUN 16 01/14/2018   NA 140 01/14/2018   K 3.2 (L) 01/14/2018   CL 97 (L) 01/14/2018   CO2 30 01/14/2018   She was evaluated by Dr Tonya Bradley , vascular, in 06/2017. According to pt,she was told edema was not related to varicose veins.  Diarrhea and nausea; She is requesting refills on Zofran. Still having nausea,no vomiting. She has not identified exacerbating or alleviating factors.  In 09/2017 she was c/o abdominal pain and constipation that was not improving with Miralax. Abdominal pain was better after defecation. Linzess was recommended, she then developed diarrhea,which continued after discontinuing medication.  She is not sure about number of stools per day, it is "not as bad" as it was. Mild abdominal cramp right before bowel movement and alleviated by defecation.  She is a poor historian and her husband reports that she has had GI issues for a while. Colonoscopy 4 years ago because diarrhea. She reports Hx of "colitis."  Dexilant did not help with nausea. GERD symptoms stable, no different  that when she was on Protonix 40 mg daily.   Review of Systems  Constitutional: Negative for activity change, appetite change, fatigue, fever and unexpected weight change.  HENT: Negative for mouth sores, nosebleeds and trouble swallowing.   Eyes: Negative for redness and visual disturbance.  Respiratory: Positive for cough. Negative for shortness of breath and wheezing.   Cardiovascular: Positive for leg swelling. Negative for chest pain.  Gastrointestinal: Positive for abdominal pain, diarrhea and nausea. Negative for vomiting.       Negative for changes in bowel habits.  Endocrine: Negative for cold intolerance and heat intolerance.  Genitourinary: Negative for decreased urine volume, dysuria and hematuria.  Musculoskeletal: Positive for back pain and myalgias. Negative for gait problem.  Skin: Negative for rash and wound.  Neurological: Negative for syncope, weakness and headaches.  Psychiatric/Behavioral: Negative for confusion. The patient is nervous/anxious.      Current Outpatient Medications on File Prior to Visit  Medication Sig Dispense Refill  . amiodarone (PACERONE) 200 MG tablet Take 1/2 tablet (100 mg) by mouth once daily (Patient taking differently: Take 100 mg by mouth daily. Take 1/2 tablet (100 mg) by mouth once daily) 90 tablet 3  . aspirin EC 81 MG tablet Take 81 mg by mouth daily.    Marland Kitchen atorvastatin (LIPITOR) 80 MG tablet Take 1 tablet (80 mg total) by mouth daily. 90 tablet 1  . Cholecalciferol (VITAMIN D) 2000 units  CAPS Take 2,000 Units by mouth daily.    . cycloSPORINE (RESTASIS) 0.05 % ophthalmic emulsion Place 1 drop into both eyes 2 (two) times daily.    . DULoxetine (CYMBALTA) 60 MG capsule TAKE 1 CAPSULE (60 MG TOTAL) BY MOUTH DAILY. (Patient taking differently: Take 60 mg by mouth every evening. ) 90 capsule 1  . Fluticasone-Umeclidin-Vilant (TRELEGY ELLIPTA) 100-62.5-25 MCG/INH AEPB Inhale 1 puff into the lungs daily. 60 each 5  . ibuprofen (ADVIL,MOTRIN)  200 MG tablet Take 400-600 mg by mouth every 8 (eight) hours as needed for headache or mild pain (2-3 tablets depending on pain).    . Multiple Vitamin (MULTIVITAMIN) tablet Take 1 tablet by mouth daily.    . nitroGLYCERIN (NITROSTAT) 0.4 MG SL tablet Place 0.4 mg under the tongue every 5 (five) minutes as needed for chest pain. Reported on 06/12/2016    . pantoprazole (PROTONIX) 40 MG tablet TAKE 1 TABLET EVERY DAY 90 tablet 0  . rOPINIRole (REQUIP) 0.25 MG tablet TAKE 3 TABLETS AT BEDTIME 90 tablet 1  . traZODone (DESYREL) 50 MG tablet Take 1 tablet (50 mg total) by mouth at bedtime as needed for sleep. (Patient taking differently: Take 50 mg by mouth at bedtime. ) 90 tablet 1  . VENTOLIN HFA 108 (90 Base) MCG/ACT inhaler INHALE 2 PUFFS INTO THE LUNGS EVERY 6 HOURS AS NEEDED FOR WHEEZING OR SHORTNESS OF BREATH 18 g 0  . HYDROcodone-acetaminophen (NORCO) 7.5-325 MG tablet Take 1 tablet by mouth every 4 (four) hours as needed for moderate pain ((score 4 to 6)). (Patient not taking: Reported on 02/16/2018) 40 tablet 0  . methocarbamol (ROBAXIN) 500 MG tablet Take 1 tablet (500 mg total) by mouth every 6 (six) hours as needed for muscle spasms. (Patient not taking: Reported on 02/16/2018) 30 tablet 0   No current facility-administered medications on file prior to visit.      Past Medical History:  Diagnosis Date  . Anemia   . Anxiety   . Arthritis    "back"  . Atrial flutter ([place]) 03/13/2016  . CAD (coronary artery disease)   . Cancer ([place])    skin cancer  . Cataract   . Colon polyp   . COPD (chronic obstructive pulmonary disease) ([place])   . Depression   . Dysrhythmia   . Emphysema of lung ([place])   . GERD (gastroesophageal reflux disease)   . Hypercholesterolemia   . Osteoporosis   . Oxygen deficiency    No Known Allergies  Social History   Socioeconomic History  . Marital status: Married    Spouse name: None  . Number of children: 3  . Years of education: 10  . Highest education  level: None  Social Needs  . Financial resource strain: None  . Food insecurity - worry: None  . Food insecurity - inability: None  . Transportation needs - medical: None  . Transportation needs - non-medical: None  Occupational History  . 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 Smoking status: Current Every Day Smoker    Packs/day: 1.50    Years: 45.00    Pack years: 67.50    Types: Cigarettes    Start date: 06/14/1966  . Smokeless tobacco: Never Used  . Tobacco comment: 1-2 packs daily  Substance and Sexual Activity  . Alcohol use: No    Alcohol/week: 0.0 oz  . Drug use: No  . Sexual activity: Yes    Birth control/protection: Post-menopausal  Other Topics Concern  . None  Social History Narrative   Quit high school and got married in 10th grade, age 44   First husband died of colon cancer at young age   Married again 2018   Lives in own home   Daughter lives with her   Drinks coffee seldom, too much "sweet tea"    Vitals:   02/16/18 1358  BP: 125/70  Pulse: 79  Temp: 98.5 F (36.9 C)  SpO2: 92%   Body mass index is 29.45 kg/m.      Physical Exam  Constitutional: She is oriented to person, place, and time. She appears well-developed. No distress.  HENT:  Head: Atraumatic.  Mouth/Throat: Oropharynx is clear and moist and mucous membranes are normal.  Eyes: Conjunctivae and EOM are normal. Pupils are equal, round, and reactive to light.  Cardiovascular: Normal rate and regular rhythm.  No murmur heard. Pulses:      Dorsalis pedis pulses are 2+ on the right side, and 2+ on the left side.  Respiratory: Effort normal and breath sounds normal. No respiratory distress.  GI: Soft. She exhibits no mass. There is no hepatomegaly. There is tenderness in the epigastric area.  Musculoskeletal: She exhibits edema (2+ pitting LE edema, bilateral.).  Lymphadenopathy:    She has no cervical adenopathy.  Neurological: She is alert and  oriented to person, place, and time. She has normal strength. Coordination normal.  Skin: Skin is warm. No erythema.  Psychiatric: She has a normal mood and affect.  Well groomed, good eye contact.       ASSESSMENT AND PLAN:   [name] was seen today for 3 months follow-up.  Orders Placed This Encounter  Procedures  . Potassium  . Ambulatory referral to Gastroenterology    1.Bilateral lower extremity edema  We discussed possible etiologies: cardiac,venous,and medications among some. I still think vein disease is the major problem. She is not wearing compression stockings as recommended. We discussed side effects of Furosemide, dose increased from 20 mg bid to tid as needed. Recommend increasing K+ containing food.  2. Insomnia, unspecified type  Stable. No changes in current management. F/U in 6 months.   3. Chronic anxiety  When she establish care I agreed on continue prescribing her Xanax but no more than 40 tabs per month. She is not interested in psychotherapy for anxiety. She feels like Cymbalta has helped with mood. No changes in current management. F/U in 3-4 months.  4. Gastroesophageal reflux disease, esophagitis presence not specified  Since no major difference with Dexilant and given the fact that Protonix is cheaper,she will go back to Protonix. GERD precautions discussed.  5. Hypokalemia  Further recommendations will be given according to labs results.  - Potassium  6. Nausea without vomiting  Persistent symptom. Could be related to GERD. GI evaluation recommended.  - Ambulatory referral to Gastroenterology  7. Diarrhea, unspecified type  ? IBS. Because It has been persistent + reporting Hx of "colitis", GI evaluation requested. Adequate hydration. Instructed about warning signs.  - Ambulatory referral to Gastroenterology     -[name] was advised to return sooner than planned today if new concerns  arise.       Wei Poplaski G. [place], MD  [facility]. [place] office.

## 2018-02-16 NOTE — Patient Instructions (Addendum)
A few things to remember from today's visit:   Insomnia, unspecified type  Chronic anxiety  Gastroesophageal reflux disease, esophagitis presence not specified - Plan: Ambulatory referral to Gastroenterology  Varicose veins of bilateral lower extremities with other complications  Hypokalemia - Plan: Potassium  Nausea without vomiting - Plan: Ambulatory referral to Gastroenterology   Furosemide increased from 2 tablets daily to 3 tablets daily.  We need to monitor potassium today and most likely start potassium tablets. Rest of the medications no changes. Continue following with orthopedist and pulmonologist.  Appointment with gastroenterology will be arranged.  Please go back to Protonix.  Please be sure medication list is accurate. If a new problem present, please set up appointment sooner than planned today.

## 2018-02-17 ENCOUNTER — Telehealth (INDEPENDENT_AMBULATORY_CARE_PROVIDER_SITE_OTHER): Payer: Self-pay | Admitting: Orthopaedic Surgery

## 2018-02-17 MED ORDER — TRAMADOL HCL 50 MG PO TABS: 50.0000 mg | ORAL_TABLET | Freq: Two times a day (BID) | ORAL | 0 refills | Status: DC | PRN

## 2018-02-17 NOTE — Telephone Encounter (Signed)
Please advise 

## 2018-02-17 NOTE — Telephone Encounter (Signed)
Patient called asking for a refill on her Tramadol. She is going on a road trip tomorrow and says she only has two pills left and only needs a few more. CB # [phone]

## 2018-02-17 NOTE — Addendum Note (Signed)
Addended by: Meyer Cory on: 02/17/2018 05:13 PM   Modules accepted: Orders

## 2018-02-17 NOTE — Telephone Encounter (Signed)
OK for final 20 tablets ultram  One po bid prn pain thanks

## 2018-02-18 ENCOUNTER — Encounter: Payer: Self-pay | Admitting: Family Medicine

## 2018-02-24 ENCOUNTER — Ambulatory Visit (INDEPENDENT_AMBULATORY_CARE_PROVIDER_SITE_OTHER): Payer: Medicare HMO | Admitting: Orthopaedic Surgery

## 2018-02-24 ENCOUNTER — Encounter (INDEPENDENT_AMBULATORY_CARE_PROVIDER_SITE_OTHER): Payer: Self-pay | Admitting: Orthopaedic Surgery

## 2018-02-24 VITALS — BP 92/83

## 2018-02-24 DIAGNOSIS — Z9889 Other specified postprocedural states: Secondary | ICD-10-CM

## 2018-02-24 NOTE — Progress Notes (Signed)
Post-Op Visit Note   Patient: Tonya Bradley           Date of Birth: [birth year]           MRN: [id number] Visit Date: 02/24/2018 PCP: Martinique, [name], MD   Assessment & Plan: Patient returns post L4-5 decompression with right L4-5 microdiscectomy.  She is been taking tramadol for the pain she has sometimes when she has trouble getting completely upright and walk some stooped forward.  She is been trying to do a little bit of housecleaning and I discussed with her she needs to slow down until she is 6 weeks after surgery and then can gradually resume normal activities.  Chief Complaint: Post L4-5 decompression and right L4-5 microdiscectomy. Chief Complaint  Patient presents with  . Follow-up   Visit Diagnoses:  1. Status post lumbar spine surgery for decompression of spinal cord     Plan: Continue walking program.  Lumbar incisions well-healed.  Inferior aspect of the incision shows a small piece of protruding Vicryl.  Follow-Up Instructions: Return in about 5 weeks (around 03/31/2018).   Orders:  No orders of the defined types were placed in this encounter.  No orders of the defined types were placed in this encounter.   Imaging: No results found.  PMFS History: Patient Active Problem List   Diagnosis Date Noted  . Bilateral lower extremity edema 02/16/2018  . Lumbar stenosis 01/19/2018  . GERD (gastroesophageal reflux disease) 11/18/2017  . 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 COPD (chronic obstructive pulmonary disease) ([place]) 09/09/2017  . Varicose veins of bilateral lower extremities with other complications 36/64/4034  . Chronic pain disorder 05/26/2017  . Back pain 05/26/2017  . Insomnia 05/26/2017  . Osteopenia 11/26/2016  . Other emphysema ([place]) 11/07/2016  . Chronic anxiety 11/07/2016  . Chronic hypoxemic respiratory failure ([place]) 11/07/2016  . Constipation 06/12/2016  . Right carotid bruit 04/03/2016  . CAD (coronary  artery disease) 04/03/2016  . Tobacco abuse 04/03/2016  . Atrial flutter ([place]) 03/13/2016  . Hiatal hernia   . Reflux esophagitis   . Schatzki's ring   . Dysphagia 03/01/2016  . Solitary pulmonary nodule 03/01/2016  . 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 Microscopic colitis 08/15/2014  . Loose stools 06/08/2014  . BCC (basal cell carcinoma), face 09/13/2012   Past Medical History:  Diagnosis Date  . Anemia   . Anxiety   . Arthritis    "back"  . Atrial flutter ([place]) 03/13/2016  . CAD (coronary artery disease)   . Cancer ([place])    skin cancer  . Cataract   . Colon polyp   . COPD (chronic obstructive pulmonary disease) ([place])   . Depression   . Dysrhythmia   . Emphysema of lung ([place])   . GERD (gastroesophageal reflux disease)   . Hypercholesterolemia   . Osteoporosis   . Oxygen deficiency     Family History  Problem Relation Age of Onset  . Breast cancer Sister 16  . Heart disease Sister   . Heart attack Father   . Heart disease Father   . Stroke Mother   . Heart disease Mother   . Hyperlipidemia Mother   . Heart attack Sister   . Heart disease Brother        ?heart failure  . Congestive Heart Failure Brother   . Hypertension Son   . Heart disease Maternal Grandfather   . Cancer Cousin   . Hypertension Son   .  Colon cancer Neg Hx     Past Surgical History:  Procedure Laterality Date  . A-FLUTTER ABLATION N/A 05/19/2017   Procedure: A-Flutter Ablation;  Surgeon: Evans Lance, MD;  Location: [facility];  Service: Cardiovascular;  Laterality: N/A;  . APPENDECTOMY    . BASAL CELL CARCINOMA EXCISION    . CARDIAC CATHETERIZATION    . CHOLECYSTECTOMY    . COLONOSCOPY N/A 06/23/2014   Dr. Rourk:Rectal and colonic polyps-removed, Pancolonic diverticulosis. lymphocytic colitis  . CORONARY ANGIOPLASTY WITH STENT PLACEMENT    . ESOPHAGEAL DILATION N/A 03/13/2016   Procedure: ESOPHAGEAL DILATION;  Surgeon: Daneil Dolin, MD;  [facility];   Service: Endoscopy;  Laterality: N/A;  . ESOPHAGOGASTRODUODENOSCOPY N/A 03/13/2016   Procedure: ESOPHAGOGASTRODUODENOSCOPY (EGD);  Surgeon: Daneil Dolin, MD;  Location[facility];  Service: Endoscopy;  Laterality: N/A;  0930  . EYE SURGERY     cataract removed, bilateral  . LUMBAR LAMINECTOMY/DECOMPRESSION MICRODISCECTOMY N/A 01/19/2018   Procedure: L4-5 DECOMPRESSION;  Surgeon: Marybelle Killings, MD;  Location: [facility];  Service: Orthopedics;  Laterality: N/A;   Social History   Occupational History  . 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 Smoking status: Current Every Day Smoker    Packs/day: 1.50    Years: 45.00    Pack years: 67.50    Types: Cigarettes    Start date: 06/14/1966  . Smokeless tobacco: Never Used  . Tobacco comment: 1-2 packs daily  Substance and Sexual Activity  . Alcohol use: No    Alcohol/week: 0.0 oz  . Drug use: No  . Sexual activity: Yes    Birth control/protection: Post-menopausal

## 2018-02-25 ENCOUNTER — Telehealth: Payer: Self-pay | Admitting: Emergency Medicine

## 2018-02-25 DIAGNOSIS — J9611 Chronic respiratory failure with hypoxia: Secondary | ICD-10-CM

## 2018-02-25 DIAGNOSIS — J431 Panlobular emphysema: Secondary | ICD-10-CM

## 2018-02-25 NOTE — Telephone Encounter (Signed)
Spoke with pt. She is requesting her ONO results. We do not have the report on this. I have called [facility] and they are going to refax this to me. ONO will be addressed by RB when he returns to the office on 03/02/18.

## 2018-03-02 ENCOUNTER — Other Ambulatory Visit: Payer: Self-pay | Admitting: Family Medicine

## 2018-03-02 NOTE — Telephone Encounter (Signed)
Attempted to call [facility], was on hold for greater than 10 minutes. Will try to call again later.

## 2018-03-03 ENCOUNTER — Encounter: Payer: Self-pay | Admitting: Physician Assistant

## 2018-03-03 ENCOUNTER — Telehealth (INDEPENDENT_AMBULATORY_CARE_PROVIDER_SITE_OTHER): Payer: Self-pay | Admitting: Orthopaedic Surgery

## 2018-03-03 ENCOUNTER — Ambulatory Visit (INDEPENDENT_AMBULATORY_CARE_PROVIDER_SITE_OTHER): Payer: Medicare HMO | Admitting: Physician Assistant

## 2018-03-03 VITALS — BP 108/62 | HR 72 | Ht 62.0 in | Wt 163.0 lb

## 2018-03-03 DIAGNOSIS — K52832 Lymphocytic colitis: Secondary | ICD-10-CM | POA: Diagnosis not present

## 2018-03-03 MED ORDER — TRAMADOL HCL 50 MG PO TABS: ORAL_TABLET | ORAL | 0 refills | Status: DC

## 2018-03-03 MED ORDER — BUDESONIDE 3 MG PO CPEP: ORAL_CAPSULE | ORAL | 4 refills | Status: DC

## 2018-03-03 NOTE — Telephone Encounter (Signed)
Spoke to [place] with [facility] and requested that ONO results be faxed to Triage fax.

## 2018-03-03 NOTE — Patient Instructions (Addendum)
We sent a prescription for Budesonide 3 mg to [facility];  Take 3 capsules ( all at the same time) every morning.  Call us in 2 weeks with an update and make an appointment  to see Nicoletta Ba PA  for around 04-08-2018.   If you are age 68 or older, your body mass index should be between 23-30. Your Body mass index is 29.81 kg/m. If this is out of the aforementioned range listed, please consider follow up with your Primary Care Provider.

## 2018-03-03 NOTE — Telephone Encounter (Signed)
I can do tramadol but if she needs something stronger, she'll have to wait on yates.  She can increase her dose of tramadol

## 2018-03-03 NOTE — Telephone Encounter (Signed)
Per Dr. Erlinda Hong, ok for Tramadol 50mg  1-2 po tid prn pain #30 with no refills. I left voicemail for patient advising.  I called to pharmacy.

## 2018-03-03 NOTE — Telephone Encounter (Signed)
Patient called needed refill on Tramadol 50 mg and says she needs something a little stronger.  Please call patient to advise

## 2018-03-03 NOTE — Progress Notes (Signed)
I agree with the above note, plan 

## 2018-03-03 NOTE — Telephone Encounter (Signed)
Can you advise on tramadol refill since Dr. Lorin Mercy is out of the office?

## 2018-03-03 NOTE — Progress Notes (Signed)
Subjective:    Patient ID: Tonya Bradley, female    DOB: [birth year], 67 y.o.   MRN: [id number]  HPI Tonya Bradley is a pleasant 67 year old white female, new to GI today referred by Dr. Betty Martinique for complaints of diarrhea and postprandial urgency. Patient had previously been evaluated by Dr. [facility] and underwent EGD in 2014 which showed a mild Schatzki's ring no evidence of Barrett's she had grade a esophagitis.  She did have esophageal dilation done. Colonoscopy in April 2015 was done for complaints of diarrhea.  She was noted to have pandiverticulosis and biopsies were positive for lymphocytic colitis. She says she was prescribed medication but could not afford it and never wound up taking anything other than some Pepto-Bismol.  She said eventually her symptoms subsided over several months. Her current symptoms started for 5 months ago.  She had an episode of constipation.  She had been given something by her primary care doctor which then started with diarrhea which she has had over the past 4 months.  She says currently it is not as bad as it initially was but she still having at least 4-5 loose stools per day and very frequent postprandial urgency.  She has not noticed any blood, she has no complaints of abdominal pain but has had some nausea.  Her weight is down 8 or 9 pounds.  She has not been on any recent antibiotics and no new medications to her knowledge. Other medical problems include coronary artery disease status post stent, COPD, atrial flutter, GERD, tonic pain syndrome, chronic anxiety, she is status post lumbar decompression and cholecystectomy.  Review of Systems;Pertinent positive and negative review of systems were noted in the above HPI section.  All other review of systems was otherwise negative.  Outpatient Encounter Medications as of 03/03/2018  Medication Sig  . ALPRAZolam (XANAX) 0.5 MG tablet TAKE 1 TABLET BY MOUTH TWICE DAILY AS NEEDED FOR ANXIETY. NO MORE THAN 40  TABS PER MONTH. NEXT REFILL ON 12-15-2017  . amiodarone (PACERONE) 200 MG tablet Take 1/2 tablet (100 mg) by mouth once daily (Patient taking differently: Take 100 mg by mouth daily. Take 1/2 tablet (100 mg) by mouth once daily)  . aspirin EC 81 MG tablet Take 81 mg by mouth daily.  Marland Kitchen atorvastatin (LIPITOR) 80 MG tablet Take 1 tablet (80 mg total) by mouth daily.  . Cholecalciferol (VITAMIN D) 2000 units CAPS Take 2,000 Units by mouth daily.  . cycloSPORINE (RESTASIS) 0.05 % ophthalmic emulsion Place 1 drop into both eyes 2 (two) times daily.  . DULoxetine (CYMBALTA) 60 MG capsule TAKE 1 CAPSULE (60 MG TOTAL) BY MOUTH DAILY.  Marland Kitchen Fluticasone-Umeclidin-Vilant (TRELEGY ELLIPTA) 100-62.5-25 MCG/INH AEPB Inhale 1 puff into the lungs daily.  . furosemide (LASIX) 20 MG tablet 1 tab 3 times per day.  . ibuprofen (ADVIL,MOTRIN) 200 MG tablet Take 400-600 mg by mouth every 8 (eight) hours as needed for headache or mild pain (2-3 tablets depending on pain).  . Multiple Vitamin (MULTIVITAMIN) tablet Take 1 tablet by mouth daily.  . nitroGLYCERIN (NITROSTAT) 0.4 MG SL tablet Place 0.4 mg under the tongue every 5 (five) minutes as needed for chest pain. Reported on 06/12/2016  . rOPINIRole (REQUIP) 0.25 MG tablet TAKE 3 TABLETS AT BEDTIME  . traMADol (ULTRAM) 50 MG tablet Take 1 tablet (50 mg total) by mouth 2 (two) times daily as needed.  . traZODone (DESYREL) 50 MG tablet Take 1 tablet (50 mg total) by mouth at bedtime as  needed for sleep. (Patient taking differently: Take 50 mg by mouth at bedtime. )  . VENTOLIN HFA 108 (90 Base) MCG/ACT inhaler INHALE 2 PUFFS INTO THE LUNGS EVERY 6 HOURS AS NEEDED FOR WHEEZING OR SHORTNESS OF BREATH  . budesonide (ENTOCORT EC) 3 MG 24 hr capsule Take 3 capsules by mouth every morning.   No facility-administered encounter medications on file as of 03/03/2018.    No Known Allergies Patient Active Problem List   Diagnosis Date Noted  . Bilateral lower extremity edema 02/16/2018   . Lumbar stenosis 01/19/2018  . GERD (gastroesophageal reflux disease) 11/18/2017  . 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 COPD (chronic obstructive pulmonary disease) ([place]) 09/09/2017  . Varicose veins of bilateral lower extremities with other complications 92/33/0076  . Chronic pain disorder 05/26/2017  . Back pain 05/26/2017  . Insomnia 05/26/2017  . Osteopenia 11/26/2016  . Other emphysema ([place]) 11/07/2016  . Chronic anxiety 11/07/2016  . Chronic hypoxemic respiratory failure ([place]) 11/07/2016  . Constipation 06/12/2016  . Right carotid bruit 04/03/2016  . CAD (coronary artery disease) 04/03/2016  . Tobacco abuse 04/03/2016  . Atrial flutter ([place]) 03/13/2016  . Hiatal hernia   . Reflux esophagitis   . Schatzki's ring   . Dysphagia 03/01/2016  . Solitary pulmonary nodule 03/01/2016  . 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 Microscopic colitis 08/15/2014  . Loose stools 06/08/2014  . BCC (basal cell carcinoma), face 09/13/2012   Social History   Socioeconomic History  . Marital status: Married    Spouse name: Not on file  . Number of children: 3  . Years of education: 10  . Highest education level: Not on file  Occupational History  . 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 Financial resource strain: Not on file  . Food insecurity:    Worry: Not on file    Inability: Not on file  . Transportation needs:    Medical: Not on file    Non-medical: Not on file  Tobacco Use  . Smoking status: Current Every Day Smoker    Packs/day: 1.50    Years: 45.00    Pack years: 67.50    Types: Cigarettes    Start date: 06/14/1966  . Smokeless tobacco: Never Used  . Tobacco comment: 1-2 packs daily  Substance and Sexual Activity  . Alcohol use: No    Alcohol/week: 0.0 oz  . Drug use: No  . Sexual activity: Yes    Birth control/protection: Post-menopausal  Lifestyle  . Physical activity:    Days  per week: Not on file    Minutes per session: Not on file  . Stress: Not on file  Relationships  . Social connections:    Talks on phone: Not on file    Gets together: Not on file    Attends religious service: Not on file    Active member of club or organization: Not on file    Attends meetings of clubs or organizations: Not on file    Relationship status: Not on file  . Intimate partner violence:    Fear of current or ex partner: Not on file    Emotionally abused: Not on file    Physically abused: Not on file    Forced sexual activity: Not on file  Other Topics Concern  . Not on file  Social History Narrative   Quit high school and got married in 10th grade, age 11  First husband died of colon cancer at young age   Married again 2018   Lives in own home   Daughter lives with her   Drinks coffee seldom, too much "sweet tea"    Ms. Jablonowski's family history includes Breast cancer (age of onset: 32) in her sister; Cancer in her cousin; Congestive Heart Failure in her brother; Heart attack in her father and sister; Heart disease in her brother, father, maternal grandfather, mother, and sister; Hyperlipidemia in her mother; Hypertension in her son and son; Stomach cancer in her other; Stroke in her mother.      Objective:    Vitals:   03/03/18 1039  BP: 108/62  Pulse: 72    Physical Exam; well-developed older white female in no acute distress, accompanied by her husband both pleasant blood pressure 108/62 pulse 72, height 5 foot 2, weight 163, BMI 29.8.  HEENT; nontraumatic normocephalic EOMI PERRLA sclera anicteric, Cardiovascular; regular rate and rhythm with S1-S2 no murmur rub or gallop, Pulmonary ;clear bilaterally, Abdomen; soft, nontender nondistended bowel sounds are active there is no palpable mass or hepatosplenomegaly, Rectal; exam not done, Extremities ;no clubbing cyanosis or edema skin warm and dry, Neuro psych; mood and affect appropriate       Assessment & Plan:     #36 67 year old white female with 4-70-month history of diarrhea with 4-5 loose bowel movements per day , postprandial urgency.,  and intermittent mild nausea this is in the setting of previously diagnosed lymphocytic colitis. Suspect her current symptoms reflect an exacerbation of lymphocytic colitis. #2 pandiverticulosis 3.  History of GERD and Schatzki's ring status post dilation 2017 4.  Chronic pain syndrome 5.  Chronic anxiety 6.  Coronary artery disease status post stent 7.  COPD 8.  Atrial flutter 9.  Cholecystectomy  Plan; will start budesonide 3 mg, 3 tablets p.o. every morning.  We will plan to treat 4-6 weeks then gradually taper. Patient will follow-up in the office with me in about a month.  She will be established with Dr. Ardis Hughs. Patient's meds were reviewed, she is not currently on a PPI, and would prefer to leave her off of acid blockers which can be associated with lymphocytic colitis.  She had been taking ibuprofen fairly regularly which she has recently discontinued.  Other meds to consider are Cymbalta and Lipitor both of which she says she has been on for quite a while.   Zinnia Tindall Genia Harold PA-C 03/03/2018   Cc: Martinique, [name], MD

## 2018-03-04 ENCOUNTER — Telehealth: Payer: Self-pay | Admitting: Physician Assistant

## 2018-03-04 ENCOUNTER — Telehealth: Payer: Self-pay | Admitting: Family Medicine

## 2018-03-04 ENCOUNTER — Other Ambulatory Visit: Payer: Self-pay | Admitting: Family Medicine

## 2018-03-04 DIAGNOSIS — F419 Anxiety disorder, unspecified: Secondary | ICD-10-CM

## 2018-03-04 DIAGNOSIS — G8929 Other chronic pain: Secondary | ICD-10-CM

## 2018-03-04 DIAGNOSIS — M5440 Lumbago with sciatica, unspecified side: Secondary | ICD-10-CM

## 2018-03-04 NOTE — Telephone Encounter (Signed)
Pt is calling back [phone]

## 2018-03-04 NOTE — Telephone Encounter (Signed)
Left message of return call. Will try again tomorrow.

## 2018-03-04 NOTE — Telephone Encounter (Signed)
Pt wants to speak with you regarding a medication that Amy prescribed yesterday.

## 2018-03-04 NOTE — Telephone Encounter (Signed)
Copied from [place] [phone]. Topic: Quick Communication - Rx Refill/Question >> Mar 04, 2018  1:33 PM Yvette Rack wrote: Medication: DexiLant 30mg  Has the patient contacted their pharmacy? Yes.  Pt states that she was just in the office and the Dr Martinique wanted her to stay on this medicine she has 2 days left she did reach out to the pharmacy (Agent: If no, request that the patient contact the pharmacy for the refill.) Preferred Pharmacy (with phone number or street name):  Agent: Please be advised that RX refills may take up to 3 business days. We ask that you follow-up with your pharmacy.

## 2018-03-04 NOTE — Telephone Encounter (Signed)
Contacted pt regarding refill her of dexilant; she states that the pharmacy attempted to contact the office but have not heard anything; this medication is no longer on the pt med list; last seen 02/16/18 nby Dr Martinique; will route request to office for review; the pt can be contacted at [phone]

## 2018-03-04 NOTE — Telephone Encounter (Signed)
Message sent to Dr. Jordan for review and approval. 

## 2018-03-04 NOTE — Telephone Encounter (Signed)
Pt calling back requesting ONO results.  I advised that per phone note we are still awaiting these results to be faxed from .  Pt states that she spoke with Ria Comment who advised that the results were received and we were waiting on RB to read the test. Checked RB's cubby, front fax, and back fax- no ONO received.      Called [facility] again to request results.

## 2018-03-04 NOTE — Telephone Encounter (Signed)
Have not received the ONO results for patient. Requested results again from [place].

## 2018-03-05 ENCOUNTER — Other Ambulatory Visit: Payer: Self-pay | Admitting: *Deleted

## 2018-03-05 ENCOUNTER — Other Ambulatory Visit: Payer: Self-pay | Admitting: Family Medicine

## 2018-03-05 DIAGNOSIS — F419 Anxiety disorder, unspecified: Secondary | ICD-10-CM

## 2018-03-05 DIAGNOSIS — K219 Gastro-esophageal reflux disease without esophagitis: Secondary | ICD-10-CM

## 2018-03-05 NOTE — Telephone Encounter (Signed)
Pt is calling back [phone]

## 2018-03-05 NOTE — Telephone Encounter (Signed)
I have checked RB's look at and folder up front, it does not appear that ONO has been received.  Called [facility] and spoke to [place] and requested that ONO be faxed to triage fax machine.

## 2018-03-05 NOTE — Telephone Encounter (Signed)
Ropinirole LOV: 02/16/18 PCP: Dr Betty Martinique Pharmacy:  [facility]

## 2018-03-05 NOTE — Telephone Encounter (Signed)
ONO results have neem received and given to LL.   Will route to RB.

## 2018-03-05 NOTE — Telephone Encounter (Signed)
ATC pt, no answer. Left message for pt to call back.  I will await call back before order is placed.

## 2018-03-05 NOTE — Telephone Encounter (Signed)
Pt called to check on status of refill  

## 2018-03-05 NOTE — Telephone Encounter (Signed)
Spoke with the patient. She has not had a bowel movement in 2 days. She is a little uncomfortable. You are treating her with Entocort for colitis. Please advise.

## 2018-03-05 NOTE — Telephone Encounter (Signed)
She desaturates on 2L/min. She needs to increase her oxygen to 3L/min while sleeping. Please place that order and let her know. Thanks

## 2018-03-06 NOTE — Telephone Encounter (Signed)
Rx for Cymbalta 30 mg to continue once daily was sent to her pharmacy.  Thanks, BJ

## 2018-03-06 NOTE — Telephone Encounter (Signed)
Left voice mail on machine for patient to return phone call back regarding increase of O2 to 3 liters. X2

## 2018-03-06 NOTE — Telephone Encounter (Signed)
Patient is notified. Presently she is on her way to [place]. She will call us if she does have any further problems.

## 2018-03-06 NOTE — Telephone Encounter (Signed)
Patient informed that medications were sent to her pharmacy.

## 2018-03-06 NOTE — Telephone Encounter (Signed)
Decrease Budesonide to 6 mg po daily ( so 2 , 3 mg tabs).. She may take  Half to one dose of miralax  As needed for constipation - if no Bm in next couple days  She came in with diarrhea.Marland KitchenMarland Kitchen

## 2018-03-07 DIAGNOSIS — J449 Chronic obstructive pulmonary disease, unspecified: Secondary | ICD-10-CM | POA: Diagnosis not present

## 2018-03-09 NOTE — Telephone Encounter (Signed)
Patient returning call for results, CB is [phone]

## 2018-03-09 NOTE — Telephone Encounter (Signed)
Called and spoke with pt letting her know the results of the ONO and that on the 2L O2, she desaturated so need to increase O2 to 3L while sleeping.  Stated to pt to continue doing same amount of O2 of 2L during the daytime that she was originally using but that we were increasing her to 3L while she slept.  Pt expressed understanding. Order sent to [facility] specifying the increase to 3L while sleeping.  Nothing further needed at this time.

## 2018-03-09 NOTE — Telephone Encounter (Signed)
Attempted to call patient, no answer, message left to call back.  

## 2018-03-18 ENCOUNTER — Telehealth (INDEPENDENT_AMBULATORY_CARE_PROVIDER_SITE_OTHER): Payer: Self-pay | Admitting: Orthopaedic Surgery

## 2018-03-18 NOTE — Telephone Encounter (Signed)
Please advise 

## 2018-03-18 NOTE — Telephone Encounter (Signed)
Patient called advised needing Rx refilled (Tramadol) Patient said her back and hip is still hurting. Patient said her back has been hurting off and on. Patient said she was vacuuming and it started hurting worse. The number to contact patient is [phone]

## 2018-03-19 NOTE — Telephone Encounter (Signed)
I called and discussed.  I told her to only vacuum 1 or 2 rooms in her house and then wait for another day to finish vacuuming.  She does turning and twisting repetitively it flares up her back after her surgery.  It is been 7 weeks.  She had similar problems a few weeks after the surgery when she started being too active too soon.  Instructed to avoid that activity until up in 6 weeks after surgery.  She will do limited housework weight and then work some on her house on a different day.  Use Tylenol or Advil which we discussed.  Can you walking program as we discussed.

## 2018-03-23 ENCOUNTER — Other Ambulatory Visit: Payer: Self-pay | Admitting: Family Medicine

## 2018-03-31 ENCOUNTER — Encounter (INDEPENDENT_AMBULATORY_CARE_PROVIDER_SITE_OTHER): Payer: Self-pay | Admitting: Orthopaedic Surgery

## 2018-03-31 ENCOUNTER — Ambulatory Visit (INDEPENDENT_AMBULATORY_CARE_PROVIDER_SITE_OTHER): Payer: Medicare HMO | Admitting: Orthopaedic Surgery

## 2018-03-31 VITALS — BP 107/66 | HR 87 | Ht 62.0 in | Wt 153.0 lb

## 2018-03-31 DIAGNOSIS — Z9889 Other specified postprocedural states: Secondary | ICD-10-CM

## 2018-03-31 NOTE — Progress Notes (Signed)
Office Visit Note   Patient: Tonya Bradley           Date of Birth: 10-01-1951           MRN: [id number] Visit Date: 03/31/2018              Requested by: Martinique, [name], MD [address] [place], [place] [address] PCP: Martinique, [name], MD   Assessment & Plan: Visit Diagnoses:  1. History of lumbar laminectomy for spinal cord decompression     Plan: She will continue to work on gradual weight loss continue to walk her dog work on a walking program break her housework up into daily segments to avoid overdoing it and flaring up her back symptoms.  She will not move any furniture by herself.  Return as needed.  Follow-Up Instructions: No follow-ups on file.   Orders:  No orders of the defined types were placed in this encounter.  No orders of the defined types were placed in this encounter.     Procedures: No procedures performed   Clinical Data: No additional findings.   Subjective: Chief Complaint  Patient presents with  . Lower Back - Routine Post Op, Follow-up    HPI patient returns she had decompression 01/19/2018 L4-5 level for spinal stenosis.  She is had some increased symptoms in her back particularly when she moved furniture chairs so first etc.  She has pain in her back when she does a lot of turning and twisting such as vacuuming and I have talked with her several times about doing just a little bit of housecleaning activity each day and not a lot at once since is likely to flareup her symptoms.  She had previous superior endplate fracture of L4.  X-rays after surgery shows intact pars and previous laminotomy/laminectomy defect from decompression L4-5 which looks good without anterolisthesis and without disc space narrowing.  No evidence of discitis on plain radiographs.  Her lumbar incision is well-healed she is ambulatory.  Review of Systems 14 point review of systems updated unchanged from her February 2019 lumbar surgery other than as mentioned in HPI.   Of note is her resting oxygen level at 86 on room air she has oxygen at home that she uses but normally does not use her portable unit.   Objective: Vital Signs: BP 107/66   Pulse 87   Ht 5\' 2"  (1.575 m)   Wt 153 lb (69.4 kg)   BMI 27.98 kg/m   Physical Exam  Constitutional: She is oriented to person, place, and time. She appears well-developed.  HENT:  Head: Normocephalic.  Right Ear: External ear normal.  Left Ear: External ear normal.  Eyes: Pupils are equal, round, and reactive to light.  Neck: No tracheal deviation present. No thyromegaly present.  Cardiovascular: Normal rate.  Pulmonary/Chest: Effort normal.  Abdominal: Soft.  Neurological: She is alert and oriented to person, place, and time.  Skin: Skin is warm and dry.  Psychiatric: She has a normal mood and affect. Her behavior is normal.    Ortho Exam patient is amatory lumbar incisions well-healed.  She has lost a little bit of weight.  Anterior tib gastrocsoleus is intact.  Good knee and hip range of motion.  She has good pedal pulses.  Specialty Comments:  No specialty comments available.  Imaging: No results found.   PMFS History: Patient Active Problem List   Diagnosis Date Noted  . Bilateral lower extremity edema 02/16/2018  . Lumbar stenosis 01/19/2018  .  GERD (gastroesophageal reflux disease) 11/18/2017  . 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 COPD (chronic obstructive pulmonary disease) ([place]) 09/09/2017  . Varicose veins of bilateral lower extremities with other complications 17/51/0258  . Chronic pain disorder 05/26/2017  . Back pain 05/26/2017  . Insomnia 05/26/2017  . Osteopenia 11/26/2016  . Other emphysema ([place]) 11/07/2016  . Chronic anxiety 11/07/2016  . Chronic hypoxemic respiratory failure ([place]) 11/07/2016  . Constipation 06/12/2016  . Right carotid bruit 04/03/2016  . CAD (coronary artery disease) 04/03/2016  . Tobacco abuse 04/03/2016  . Atrial  flutter ([place]) 03/13/2016  . Hiatal hernia   . Reflux esophagitis   . Schatzki's ring   . Dysphagia 03/01/2016  . Solitary pulmonary nodule 03/01/2016  . 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 Microscopic colitis 08/15/2014  . Loose stools 06/08/2014  . BCC (basal cell carcinoma), face 09/13/2012   Past Medical History:  Diagnosis Date  . Anemia   . Anxiety   . Arthritis    "back"  . Atrial flutter ([place]) 03/13/2016  . CAD (coronary artery disease)   . Cancer ([place])    skin cancer  . Cataract   . Colon polyp   . COPD (chronic obstructive pulmonary disease) ([place])   . Depression   . Dysrhythmia   . Emphysema of lung ([place])   . GERD (gastroesophageal reflux disease)   . Hypercholesterolemia   . Osteoporosis   . Oxygen deficiency     Family History  Problem Relation Age of Onset  . Breast cancer Sister 37  . Heart disease Sister   . Heart attack Father   . Heart disease Father   . Stroke Mother   . Heart disease Mother   . Hyperlipidemia Mother   . Heart attack Sister   . Heart disease Brother        ?heart failure  . Congestive Heart Failure Brother   . Hypertension Son   . Heart disease Maternal Grandfather   . Cancer Cousin        lymphoma  . Hypertension Son   . Stomach cancer Other   . Colon cancer Neg Hx     Past Surgical History:  Procedure Laterality Date  . A-FLUTTER ABLATION N/A 05/19/2017   Procedure: A-Flutter Ablation;  Surgeon: Evans Lance, MD;  Location: [facility];  Service: Cardiovascular;  Laterality: N/A;  . APPENDECTOMY    . BASAL CELL CARCINOMA EXCISION    . CARDIAC CATHETERIZATION    . CATARACT EXTRACTION Bilateral   . CHOLECYSTECTOMY    . COLONOSCOPY N/A 06/23/2014   Dr. Rourk:Rectal and colonic polyps-removed, Pancolonic diverticulosis. lymphocytic colitis  . CORONARY ANGIOPLASTY WITH STENT PLACEMENT    . ESOPHAGEAL DILATION N/A 03/13/2016   Procedure: ESOPHAGEAL DILATION;  Surgeon: Daneil Dolin, MD;  [facility];  Service: Endoscopy;  Laterality: N/A;  . ESOPHAGOGASTRODUODENOSCOPY N/A 03/13/2016   Procedure: ESOPHAGOGASTRODUODENOSCOPY (EGD);  Surgeon: Daneil Dolin, MD;  Location[facility];  Service: Endoscopy;  Laterality: N/A;  0930  . EYE SURGERY     cataract removed, bilateral  . LUMBAR LAMINECTOMY/DECOMPRESSION MICRODISCECTOMY N/A 01/19/2018   Procedure: L4-5 DECOMPRESSION;  Surgeon: Marybelle Killings, MD;  Location: [facility];  Service: Orthopedics;  Laterality: N/A;   Social History   Occupational History  . 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 Smoking status: Current Every Day Smoker    Packs/day: 1.50  Years: 45.00    Pack years: 67.50    Types: Cigarettes    Start date: 06/14/1966  . Smokeless tobacco: Never Used  . Tobacco comment: 1-2 packs daily  Substance and Sexual Activity  . Alcohol use: No    Alcohol/week: 0.0 oz  . Drug use: No  . Sexual activity: Yes    Birth control/protection: Post-menopausal

## 2018-04-06 DIAGNOSIS — J449 Chronic obstructive pulmonary disease, unspecified: Secondary | ICD-10-CM | POA: Diagnosis not present

## 2018-04-20 ENCOUNTER — Other Ambulatory Visit: Payer: Self-pay | Admitting: Family Medicine

## 2018-05-07 DIAGNOSIS — J449 Chronic obstructive pulmonary disease, unspecified: Secondary | ICD-10-CM | POA: Diagnosis not present

## 2018-05-21 ENCOUNTER — Encounter: Payer: Self-pay | Admitting: Physician Assistant

## 2018-05-21 ENCOUNTER — Ambulatory Visit (INDEPENDENT_AMBULATORY_CARE_PROVIDER_SITE_OTHER): Payer: Medicare HMO | Admitting: Physician Assistant

## 2018-05-21 VITALS — BP 100/52 | HR 76 | Ht 62.0 in | Wt 158.5 lb

## 2018-05-21 DIAGNOSIS — K52832 Lymphocytic colitis: Secondary | ICD-10-CM | POA: Diagnosis not present

## 2018-05-21 DIAGNOSIS — K582 Mixed irritable bowel syndrome: Secondary | ICD-10-CM | POA: Diagnosis not present

## 2018-05-21 MED ORDER — LINACLOTIDE 145 MCG PO CAPS: ORAL_CAPSULE | ORAL | 3 refills | Status: DC

## 2018-05-21 NOTE — Patient Instructions (Signed)
Take Linzess 145 mcg daily. Go to every other day as needed.   Stop Budesonide.   Stop Ibuprofen, and  anti-inflammatories.  Call us for problems. If you are age 68 or older, your body mass index should be between 23-30. Your Body mass index is 28.99 kg/m. If this is out of the aforementioned range listed, please consider follow up with your Primary Care Provider.

## 2018-05-21 NOTE — Progress Notes (Signed)
Subjective:    Patient ID: Tonya Bradley, female    DOB: [birth year], 67 y.o.   MRN: [id number]  HPI Tonya Bradley is a pleasant 67 year old female, known to Dr. Ardis Hughs.  She was initially seen in our office in April 2019 as a new patient at that time with complaints of diarrhea postprandial urgency for bowel movements. She had had prior GI evaluation by Dr. Gala Romney with a EGD in 2014 showing a mild Schatzki's ring, no evidence of Barrett's and grade a esophagitis was noted.  She also had esophageal dilation performed. Colonoscopy in April 2015 was done for diarrhea per Dr. Gala Romney.  She had pandiverticulosis and biopsies were positive for lymphocytic colitis.  Colonoscopy report showed removal of the diminutive polyp however biopsies did not show any polyp tissue. At the time of last office visit due to complaints of diarrhea for the previous 4 to 5 months and weight loss of 8 to 9 pounds she was started on a trial of budesonide 9 mg/day for exacerbation of lymphocytic colitis. She called the office a couple of days later and said that she had become constipated as soon as she started the budesonide.  She was advised to decrease to 6 mg/day.  She has continued on 6 mg/day but also started herself back on Linzess 145 mcg daily which she had had from another physician. She comes back in today for follow-up stating that she is been having a lot of gas some mild abdominal cramping and with the use of Linzess she is having 3-4 looser stools most days.  She says if she knows she has to go somewhere to get the Cool. Says she says had similar episodes in the past with prolonged diarrhea followed by abrupt onset of constipation and says at times she will alternate back and forth.  Review of Systems Pertinent positive and negative review of systems were noted in the above HPI section.  All other review of systems was otherwise negative.  Outpatient Encounter Medications as of 05/21/2018  Medication Sig  . ALPRAZolam  (XANAX) 0.5 MG tablet TAKE 1 TABLET BY MOUTH TWICE DAILY AS NEEDED FOR ANXIETY. NO MORE THAN 40 TABS PER MONTH. NEXT REFILL ON 12-15-2017  . amiodarone (PACERONE) 200 MG tablet Take 1/2 tablet (100 mg) by mouth once daily (Patient taking differently: Take 100 mg by mouth daily. Take 1/2 tablet (100 mg) by mouth once daily)  . aspirin EC 81 MG tablet Take 81 mg by mouth daily.  Marland Kitchen atorvastatin (LIPITOR) 80 MG tablet TAKE 1 TABLET EVERY DAY  . budesonide (ENTOCORT EC) 3 MG 24 hr capsule Take 3 capsules by mouth every morning. (Patient taking differently: Take 2 capsules by mouth every morning.)  . Cholecalciferol (VITAMIN D) 2000 units CAPS Take 2,000 Units by mouth daily.  . cycloSPORINE (RESTASIS) 0.05 % ophthalmic emulsion Place 1 drop into both eyes 2 (two) times daily.  Marland Kitchen DEXILANT 30 MG capsule TAKE 1 CAPSULE(30 MG) BY MOUTH DAILY  . DULoxetine (CYMBALTA) 60 MG capsule TAKE 1 CAPSULE EVERY DAY  . Fluticasone-Umeclidin-Vilant (TRELEGY ELLIPTA) 100-62.5-25 MCG/INH AEPB Inhale 1 puff into the lungs daily.  . furosemide (LASIX) 20 MG tablet 1 tab 3 times per day.  . ibuprofen (ADVIL,MOTRIN) 200 MG tablet Take 400-600 mg by mouth every 8 (eight) hours as needed for headache or mild pain (2-3 tablets depending on pain).  Marland Kitchen linaclotide (LINZESS) 145 MCG CAPS capsule Take 1 tablet by mouth with breakfast.  . Multiple Vitamin (MULTIVITAMIN) tablet Take  1 tablet by mouth daily.  . nitroGLYCERIN (NITROSTAT) 0.4 MG SL tablet Place 0.4 mg under the tongue every 5 (five) minutes as needed for chest pain. Reported on 06/12/2016  . pantoprazole (PROTONIX) 40 MG tablet TAKE 1 TABLET EVERY DAY  . rOPINIRole (REQUIP) 0.25 MG tablet TAKE 3 TABLETS AT BEDTIME  . traZODone (DESYREL) 50 MG tablet Take 1 tablet (50 mg total) by mouth at bedtime as needed for sleep. (Patient taking differently: Take 50 mg by mouth at bedtime. )  . VENTOLIN HFA 108 (90 Base) MCG/ACT inhaler INHALE 2 PUFFS INTO THE LUNGS EVERY 6 HOURS AS  NEEDED FOR WHEEZING OR SHORTNESS OF BREATH  . [DISCONTINUED] linaclotide (LINZESS) 145 MCG CAPS capsule Take 145 mcg by mouth daily before breakfast.   No facility-administered encounter medications on file as of 05/21/2018.    No Known Allergies Patient Active Problem List   Diagnosis Date Noted  . Bilateral lower extremity edema 02/16/2018  . Lumbar stenosis 01/19/2018  . GERD (gastroesophageal reflux disease) 11/18/2017  . 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 COPD (chronic obstructive pulmonary disease) ([place]) 09/09/2017  . Varicose veins of bilateral lower extremities with other complications 40/98/1191  . Chronic pain disorder 05/26/2017  . Back pain 05/26/2017  . Insomnia 05/26/2017  . Osteopenia 11/26/2016  . Other emphysema ([place]) 11/07/2016  . Chronic anxiety 11/07/2016  . Chronic hypoxemic respiratory failure ([place]) 11/07/2016  . Constipation 06/12/2016  . Right carotid bruit 04/03/2016  . CAD (coronary artery disease) 04/03/2016  . Tobacco abuse 04/03/2016  . Atrial flutter ([place]) 03/13/2016  . Hiatal hernia   . Reflux esophagitis   . Schatzki's ring   . Dysphagia 03/01/2016  . Solitary pulmonary nodule 03/01/2016  . 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 Microscopic colitis 08/15/2014  . Loose stools 06/08/2014  . BCC (basal cell carcinoma), face 09/13/2012   Social History   Socioeconomic History  . Marital status: Married    Spouse name: Not on file  . Number of children: 3  . Years of education: 10  . Highest education level: Not on file  Occupational History  . 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 Financial resource strain: Not on file  . Food insecurity:    Worry: Not on file    Inability: Not on file  . Transportation needs:    Medical: Not on file    Non-medical: Not on file  Tobacco Use  . Smoking status: Current Every Day Smoker    Packs/day: 1.50    Years: 45.00      Pack years: 67.50    Types: Cigarettes    Start date: 06/14/1966  . Smokeless tobacco: Never Used  . Tobacco comment: 1-2 packs daily  Substance and Sexual Activity  . Alcohol use: No    Alcohol/week: 0.0 oz  . Drug use: No  . Sexual activity: Yes    Birth control/protection: Post-menopausal  Lifestyle  . Physical activity:    Days per week: Not on file    Minutes per session: Not on file  . Stress: Not on file  Relationships  . Social connections:    Talks on phone: Not on file    Gets together: Not on file    Attends religious service: Not on file    Active member of club or organization: Not on file    Attends meetings of clubs or organizations: Not on file  Relationship status: Not on file  . Intimate partner violence:    Fear of current or ex partner: Not on file    Emotionally abused: Not on file    Physically abused: Not on file    Forced sexual activity: Not on file  Other Topics Concern  . Not on file  Social History Narrative   Quit high school and got married in 10th grade, age 17   First husband died of colon cancer at young age   Married again 2018   Lives in own home   Daughter lives with her   Drinks coffee seldom, too much "sweet tea"    Tonya Bradley's family history includes Breast cancer (age of onset: 67) in her sister; Cancer in her cousin; Congestive Heart Failure in her brother; Heart attack in her father and sister; Heart disease in her brother, father, maternal grandfather, mother, and sister; Hyperlipidemia in her mother; Hypertension in her son and son; Stomach cancer in her other; Stroke in her mother.      Objective:    Vitals:   05/21/18 1331  BP: (!) 100/52  Pulse: 76    Physical Exam; well-developed older female in no acute distress, accompanied by her husband blood pressure 100/52, pulse 76, height 5 foot 2, weight 158, BMI of 28.9.  HEENT; nontraumatic normocephalic EOMI PERRLA sclera anicteric, Neck supple, Oropharynx clear,  Cardiovascular; regular rate and rhythm with S1-S2, Pulmonary ;clear bilaterally, Abdomen; soft, bowel sounds are present, there is no focal tenderness, no guarding or rebound no palpable mass or hepatosplenomegaly, Rectal ;exam not done, Extremities; no clubbing cyanosis or edema skin warm and dry, Neuro psych alert; and oriented, grossly nonfocal mood and affect appropriate       Assessment & Plan:   #75 67 year old white female with diagnosis of IBS and lymphocytic colitis, pandiverticulosis who was initially seen about 2 months ago with what was felt to be an exacerbation of lymphocytic colitis. She developed constipation which persisted immediately after starting budesonide and is now back on Linzess.  I do not think she had a real exacerbation of lymphocytic colitis at this point, and suspect her symptoms are related to IBS with alternating bowel habits.  #2 History of atrial flutter #3.  Coronary artery disease status post stent #4.  COPD #5.  Chronic pain #6 Status post cholecystectomy #7  GERD-stable on Protonix  Plan; stop budesonide Continue Linzess 145 mcg daily or every other day, this was discussed with the patient. She asks about follow-up colonoscopy.  No polyps found at the time of last colonoscopy in 2015.  She reports remote history of colon polyps found in the early 2000's type unknown and this procedure was done in [place].  Can consider follow-up colonoscopy at a 5-year interval. She will follow-up with Dr. Ardis Hughs or myself as needed.  Daden Mahany Genia Harold PA-C 05/21/2018   Cc: Tonya Bradley, Tonya G, MD

## 2018-05-22 NOTE — Progress Notes (Signed)
I agree with the above note, plan 

## 2018-05-29 ENCOUNTER — Other Ambulatory Visit: Payer: Self-pay | Admitting: Family Medicine

## 2018-05-29 DIAGNOSIS — F419 Anxiety disorder, unspecified: Secondary | ICD-10-CM

## 2018-06-06 DIAGNOSIS — J449 Chronic obstructive pulmonary disease, unspecified: Secondary | ICD-10-CM | POA: Diagnosis not present

## 2018-06-19 ENCOUNTER — Telehealth: Payer: Self-pay | Admitting: Emergency Medicine

## 2018-06-19 ENCOUNTER — Encounter: Payer: Self-pay | Admitting: Family Medicine

## 2018-06-19 ENCOUNTER — Other Ambulatory Visit: Payer: Self-pay | Admitting: Family Medicine

## 2018-06-19 ENCOUNTER — Ambulatory Visit (INDEPENDENT_AMBULATORY_CARE_PROVIDER_SITE_OTHER): Payer: Medicare HMO | Admitting: Family Medicine

## 2018-06-19 VITALS — BP 120/70 | HR 83 | Temp 98.5°F | Resp 16 | Ht 62.0 in | Wt 158.4 lb

## 2018-06-19 DIAGNOSIS — K2100 Gastro-esophageal reflux disease with esophagitis, without bleeding: Secondary | ICD-10-CM

## 2018-06-19 DIAGNOSIS — R600 Localized edema: Secondary | ICD-10-CM

## 2018-06-19 DIAGNOSIS — M48062 Spinal stenosis, lumbar region with neurogenic claudication: Secondary | ICD-10-CM

## 2018-06-19 DIAGNOSIS — F419 Anxiety disorder, unspecified: Secondary | ICD-10-CM

## 2018-06-19 DIAGNOSIS — R490 Dysphonia: Secondary | ICD-10-CM | POA: Diagnosis not present

## 2018-06-19 DIAGNOSIS — K5900 Constipation, unspecified: Secondary | ICD-10-CM

## 2018-06-19 DIAGNOSIS — G4700 Insomnia, unspecified: Secondary | ICD-10-CM

## 2018-06-19 MED ORDER — LINACLOTIDE 290 MCG PO CAPS: 290.0000 ug | ORAL_CAPSULE | Freq: Every day | ORAL | 1 refills | Status: DC

## 2018-06-19 MED ORDER — DOXEPIN HCL 10 MG/ML PO CONC: 6.0000 mg | Freq: Every evening | ORAL | 1 refills | Status: DC | PRN

## 2018-06-19 MED ORDER — TIZANIDINE HCL 4 MG PO TABS: 4.0000 mg | ORAL_TABLET | Freq: Two times a day (BID) | ORAL | 0 refills | Status: DC | PRN

## 2018-06-19 NOTE — Progress Notes (Signed)
Received call from [facility]. Stated patient's pharmacy did not get her rx for doxepin.  Will resend.  FYI to PCP.   Algis Greenhouse. Jerline Pain, MD 06/19/2018 6:48 PM

## 2018-06-19 NOTE — Telephone Encounter (Signed)
Spoke with pt. States that she is having some issues with [facility]. Reports that her humidifier on her oxygen concentrator has "busted." Pt has already ordered a new one through [facility] as of last Wednesday afternoon. She still has not received this. Pt is frustrated that she has not received this. States that [facility] told her that she should receive this part this afternoon. Advised pt to let us know if she does not receive it and will be call [facility] on her behalf. Pt verbalized understanding. Nothing further was needed.

## 2018-06-19 NOTE — Progress Notes (Signed)
HPI:   Tonya Bradley is a 67 y.o. female, who is here today with her husband for 4 months follow up.   She was last seen on 02/16/18.  Since her last OV she has followed with ortho for lower back pain.According to pt,she was released, no further treatment recommendations given.She states that pain has not improved and because her medical Hx surgery was not an option. Severe lower,bilateral back pain. It is radiated to LE's.  No saddle anesthesia or changes in bowel or urine continence.  States that she cannot take NSAID's because her heart.  Today she is c/o constipation. She is taking Linzess 145 mcg daily and would like to increase dose. She was referred to GI because c/o persistent diarrhea, which she attributed to [place].     Bowel movements are small and hard,rectal tenesmus.  Sometimes abdominal cramps that improved after defecation. No nausea or vomiting.   Insomnia: She is on Trazodone 50 mg, which she does not feel like it is helping. Sleeping about 4 hours. She has difficulty falling and staying asleep.   She is also c/o hoarseness. This is a chronic problem. She has already being evaluated by ENT. According to husband ,she was told dysphonia was related to GERD. No changes in cough,stridor,or dysphagia.  She has Dexilant 30 mg and Protonix 40 mg.She is not sure which one she is taking. She is almost positive she is not taking Protonix.   Still smoking.  Anxiety: She is on Cymbalta 60 mg and Xanax 0.5 mg, which she can take bid as needed but no more than 40 tabs per months. She states that dose she is taking is not enough.  She denies depressed mood or suicidal thoughts.  LE edema: It has improved. She is on Lasix 20 mg tid prn.  No erythema or ulcers.  Gets up in the middle of the night to urinate.   Review of Systems  Constitutional: Negative for activity change, appetite change, fatigue and fever.  HENT: Positive for voice change.  Negative for mouth sores, nosebleeds, sore throat and trouble swallowing.   Eyes: Negative for redness and visual disturbance.  Respiratory: Negative for cough, shortness of breath and wheezing.   Cardiovascular: Negative for chest pain, palpitations and leg swelling.  Gastrointestinal: Negative for abdominal pain, nausea and vomiting.       Negative for changes in bowel habits.  Genitourinary: Negative for decreased urine volume, dysuria and hematuria.  Musculoskeletal: Positive for back pain. Negative for gait problem.  Skin: Negative for rash and wound.  Neurological: Negative for syncope, weakness, numbness and headaches.  Psychiatric/Behavioral: Positive for sleep disturbance. Negative for confusion. The patient is nervous/anxious.      Current Outpatient Medications on File Prior to Visit  Medication Sig Dispense Refill  . ALPRAZolam (XANAX) 0.5 MG tablet TAKE 1 TABLET BY MOUTH TWICE DAILY AS NEEDED FOR ANXIETY. NO MORE THAN 40 TABLETS PER MONTH 40 tablet 2  . amiodarone (PACERONE) 200 MG tablet Take 1/2 tablet (100 mg) by mouth once daily (Patient taking differently: Take 100 mg by mouth daily. Take 1/2 tablet (100 mg) by mouth once daily) 90 tablet 3  . aspirin EC 81 MG tablet Take 81 mg by mouth daily.    Marland Kitchen atorvastatin (LIPITOR) 80 MG tablet TAKE 1 TABLET EVERY DAY 90 tablet 1  . Cholecalciferol (VITAMIN D) 2000 units CAPS Take 2,000 Units by mouth daily.    . cycloSPORINE (RESTASIS) 0.05 % ophthalmic emulsion  Place 1 drop into both eyes 2 (two) times daily.    Marland Kitchen DEXILANT 30 MG capsule TAKE 1 CAPSULE(30 MG) BY MOUTH DAILY 90 capsule 2  . DULoxetine (CYMBALTA) 60 MG capsule TAKE 1 CAPSULE EVERY DAY 90 capsule 1  . Fluticasone-Umeclidin-Vilant (TRELEGY ELLIPTA) 100-62.5-25 MCG/INH AEPB Inhale 1 puff into the lungs daily. 60 each 5  . furosemide (LASIX) 20 MG tablet 1 tab 3 times per day. 90 tablet 3  . ibuprofen (ADVIL,MOTRIN) 200 MG tablet Take 400-600 mg by mouth every 8 (eight)  hours as needed for headache or mild pain (2-3 tablets depending on pain).    . Multiple Vitamin (MULTIVITAMIN) tablet Take 1 tablet by mouth daily.    . nitroGLYCERIN (NITROSTAT) 0.4 MG SL tablet Place 0.4 mg under the tongue every 5 (five) minutes as needed for chest pain. Reported on 06/12/2016    . rOPINIRole (REQUIP) 0.25 MG tablet TAKE 3 TABLETS AT BEDTIME 180 tablet 1  . VENTOLIN HFA 108 (90 Base) MCG/ACT inhaler INHALE 2 PUFFS INTO THE LUNGS EVERY 6 HOURS AS NEEDED FOR WHEEZING OR SHORTNESS OF BREATH 18 g 0   No current facility-administered medications on file prior to visit.      Past Medical History:  Diagnosis Date  . Anemia   . Anxiety   . Arthritis    "back"  . Atrial flutter ([place]) 03/13/2016  . CAD (coronary artery disease)   . Cancer ([place])    skin cancer  . Cataract   . Colon polyp   . COPD (chronic obstructive pulmonary disease) ([place])   . Depression   . Dysrhythmia   . Emphysema of lung ([place])   . GERD (gastroesophageal reflux disease)   . Hypercholesterolemia   . Osteoporosis   . Oxygen deficiency    No Known Allergies  Social History   Socioeconomic History  . Marital status: Married    Spouse name: Not on file  . Number of children: 3  . Years of education: 10  . Highest education level: Not on file  Occupational History  . 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 Financial resource strain: Not on file  . Food insecurity:    Worry: Not on file    Inability: Not on file  . Transportation needs:    Medical: Not on file    Non-medical: Not on file  Tobacco Use  . Smoking status: Current Every Day Smoker    Packs/day: 1.50    Years: 45.00    Pack years: 67.50    Types: Cigarettes    Start date: 06/14/1966  . Smokeless tobacco: Never Used  . Tobacco comment: 1-2 packs daily  Substance and Sexual Activity  . Alcohol use: No    Alcohol/week: 0.0 oz  . Drug use: No  . Sexual activity: Yes    Birth  control/protection: Post-menopausal  Lifestyle  . Physical activity:    Days per week: Not on file    Minutes per session: Not on file  . Stress: Not on file  Relationships  . Social connections:    Talks on phone: Not on file    Gets together: Not on file    Attends religious service: Not on file    Active member of club or organization: Not on file    Attends meetings of clubs or organizations: Not on file    Relationship status: Not on file  Other Topics Concern  . Not on file  Social History Narrative   Quit high school and got married in 10th grade, age 1   First husband died of colon cancer at young age   Married again 2018   Lives in own home   Daughter lives with her   Drinks coffee seldom, too much "sweet tea"    Vitals:   06/19/18 1444  BP: 120/70  Pulse: 83  Resp: 16  Temp: 98.5 F (36.9 C)  SpO2: 94%   Body mass index is 28.97 kg/m.   Physical Exam  Nursing note and vitals reviewed. Constitutional: She is oriented to person, place, and time. She appears well-developed. No distress.  HENT:  Head: Atraumatic.  Mouth/Throat: Oropharynx is clear and moist and mucous membranes are normal.  Mild dysphonia.  Eyes: Pupils are equal, round, and reactive to light. Conjunctivae are normal.  Cardiovascular: Normal rate and regular rhythm.  No murmur heard. Pulses:      Dorsalis pedis pulses are 2+ on the right side, and 2+ on the left side.  Respiratory: Effort normal and breath sounds normal. No respiratory distress.  GI: Soft. She exhibits no mass. There is no hepatomegaly. There is no tenderness.  Musculoskeletal: She exhibits edema (1+ pitting lower extremity edema, bilateral.).       Lumbar back: She exhibits no tenderness and no bony tenderness.  Lymphadenopathy:    She has no cervical adenopathy.  Neurological: She is alert and oriented to person, place, and time. She has normal strength. Coordination and gait normal.  Reflex Scores:      Patellar  reflexes are 2+ on the right side and 2+ on the left side. SLR negative bilateral.  Skin: Skin is warm. No erythema.  Psychiatric: Her mood appears anxious.  Well groomed, good eye contact.       ASSESSMENT AND PLAN:   [name] was seen today for 4 months follow-up.  Orders Placed This Encounter  Procedures  . Basic metabolic panel   Lab Results  Component Value Date   CREATININE 0.66 06/19/2018   BUN 16 06/19/2018   NA 140 06/19/2018   K 3.4 (L) 06/19/2018   CL 102 06/19/2018   CO2 27 06/19/2018    1. Insomnia, unspecified type  She has been on Doxepin before but she does not remember if she has taken it. She would like to try, so Doxepin 3-6 mg at bedtime as needed. Stop Trazodone. Educated about some side effects including risk of interaction with Amiodarone. Good sleep hygiene. F/U in 4-5 months but she will let mw know if medication is helping in 3-4 weeks.   2. Constipation, unspecified constipation type  ? IBS, She seems to alternate between constipation and diarrhea. She understand side effects of medication, still would like to try higher dose. She will increase Linzess from 145 mcg to 290 mcg.   3. Reflux esophagitis  Most likely this problem is causing chronic dysphonia. Problem has been difficult to manage.Last time we discussed problem I recommended Dexilant. GERD precautions. Smoking cessation.  4. Chronic anxiety  We discussed side effects of benzo meds. I prefer not to increase dose. No changes in Cymbalta. Physicotherapy may help. Instructed about warning signs. F/U in 4-5 months.   5. Bilateral lower extremity edema  Improved. Related to vein disease.  Compression stocking recommended and/or elevation of LE. No changes in Furosemide. F/U in 4-5 months.  - Basic metabolic panel  6. Dysphonia  Chronic. According to pt work up done by ENT was  negative otherwise except for irritation of vocal cords. Voice  rest. Continue Dexilant.  7. Spinal stenosis of lumbar region with neurogenic claudication  No further treatment options with ortho. Given the fact she is omn xanax I do not recommended opioids. She su not interested in pain clinig. Recommended trying [place] ho patch.    [name]. Martinique, MD  [facility]. [place] office.

## 2018-06-19 NOTE — Patient Instructions (Addendum)
A few things to remember from today's visit:   Constipation, unspecified constipation type  Insomnia, unspecified type  Reflux esophagitis  Chronic anxiety  Bilateral lower extremity edema  No changes in Xanax. Linzess dose increased as requested.  Adequate fiber intake, remember this medication can cause diarrhea. Trazodone discontinued. Doxepin started, 3 mg, 6 mg, and 10 mg to try. Please let me know if this medication is helping.  Remember this medication can interact with some of your medications and can increase the risk of falls. Good sleep hygiene.   I would like for you you to schedule a Medicare Annual Wellness Visit (AWV).   This is a yearly appointment with our Health Coach Wynetta Fines, RN) and is designed to develop a personalized prevention plan. This is not a head to toe physical, but rather an opportunity to prevent illness based on your current health and risk factors for disease.   Visits usually last 30-60 minutes and include various screenings for hearing, vision, depression, and dementia, falls, and safety concerns. The visit also includes diet and exercise counseling and information about advance directives.   This is also an opportunity to discuss appropriate health maintenance testing such as mammography, colonoscopy, lung cancer screening, and hepatitis C testing.   The AWV is fully covered by Medicare Part B if:  . You have had Part B for over 12 months, AND . You have not had an AWV in the past 12 months .  Please don't miss out on this opportunity! Set up your appointment today!  Please be sure medication list is accurate. If a new problem present, please set up appointment sooner than planned today.

## 2018-06-20 ENCOUNTER — Encounter: Payer: Self-pay | Admitting: Family Medicine

## 2018-06-20 LAB — BASIC METABOLIC PANEL
BUN: 16 mg/dL (ref 7–25)
CHLORIDE: 102 mmol/L (ref 98–110)
CO2: 27 mmol/L (ref 20–32)
CREATININE: 0.66 mg/dL (ref 0.50–0.99)
Calcium: 9.4 mg/dL (ref 8.6–10.4)
Glucose, Bld: 101 mg/dL — ABNORMAL HIGH (ref 65–99)
Potassium: 3.4 mmol/L — ABNORMAL LOW (ref 3.5–5.3)
Sodium: 140 mmol/L (ref 135–146)

## 2018-06-20 MED ORDER — POTASSIUM CHLORIDE ER 10 MEQ PO TBCR: 10.0000 meq | EXTENDED_RELEASE_TABLET | Freq: Every day | ORAL | 3 refills | Status: DC

## 2018-06-22 ENCOUNTER — Telehealth: Payer: Self-pay | Admitting: Family Medicine

## 2018-06-22 NOTE — Telephone Encounter (Signed)
Copied from [place] [id number]. Topic: Quick Communication - Rx Refill/Question >> Jun 22, 2018  9:50 AM Burchel, Abbi R wrote: Medication: doxepin (SINEQUAN) 10 MG/ML solution  Preferred Pharmacy:[facility] [phone], [place] [address] [place] [address] Phone: [phone] Fax: [phone] Not a 24 hour pharmacy; exact hours not known.  Pt states this Rx requires a prior auth.

## 2018-06-22 NOTE — Telephone Encounter (Signed)
Prior auth for Doxepin 10mg /ml sent to [email or web address].

## 2018-07-07 DIAGNOSIS — J449 Chronic obstructive pulmonary disease, unspecified: Secondary | ICD-10-CM | POA: Diagnosis not present

## 2018-07-08 ENCOUNTER — Telehealth: Payer: Self-pay | Admitting: Family Medicine

## 2018-07-08 NOTE — Telephone Encounter (Signed)
Copied from [place] [id number]. Topic: General - Other >> Jul 08, 2018  3:03 PM Cecelia Byars, NT wrote: Reason for CRM: The patients husband called and sad the doxepin (SINEQUAN) 10 MG/ML solution ,does not work please advise [phone]

## 2018-07-08 NOTE — Telephone Encounter (Signed)
Message sent to Dr. Jordan for review. 

## 2018-07-09 NOTE — Telephone Encounter (Signed)
Patient calling again checking status

## 2018-07-10 ENCOUNTER — Other Ambulatory Visit: Payer: Self-pay | Admitting: Family Medicine

## 2018-07-10 ENCOUNTER — Other Ambulatory Visit: Payer: Self-pay | Admitting: *Deleted

## 2018-07-10 DIAGNOSIS — G4700 Insomnia, unspecified: Secondary | ICD-10-CM

## 2018-07-10 MED ORDER — DOXEPIN HCL 10 MG PO CAPS: ORAL_CAPSULE | ORAL | 2 refills | Status: DC

## 2018-07-10 MED ORDER — DOXEPIN HCL 10 MG/ML PO CONC: 6.0000 mg | Freq: Every evening | ORAL | 1 refills | Status: DC | PRN

## 2018-07-10 NOTE — Telephone Encounter (Signed)
We do not have many options at this time. She can increase the dose up to 20 mg.  Thanks, BJ

## 2018-07-10 NOTE — Telephone Encounter (Signed)
Patient given instructions concerning medication. Rx for 10 mg capsules sent to pharmacy as requested, liquid d/c.

## 2018-07-10 NOTE — Telephone Encounter (Signed)
Left message to give clinic a call back.

## 2018-07-13 ENCOUNTER — Other Ambulatory Visit: Payer: Self-pay | Admitting: *Deleted

## 2018-07-13 ENCOUNTER — Telehealth: Payer: Self-pay | Admitting: Family Medicine

## 2018-07-13 DIAGNOSIS — H26491 Other secondary cataract, right eye: Secondary | ICD-10-CM | POA: Diagnosis not present

## 2018-07-13 DIAGNOSIS — I83813 Varicose veins of bilateral lower extremities with pain: Secondary | ICD-10-CM

## 2018-07-13 DIAGNOSIS — H353121 Nonexudative age-related macular degeneration, left eye, early dry stage: Secondary | ICD-10-CM | POA: Diagnosis not present

## 2018-07-13 DIAGNOSIS — Z961 Presence of intraocular lens: Secondary | ICD-10-CM | POA: Diagnosis not present

## 2018-07-13 MED ORDER — ROPINIROLE HCL 0.25 MG PO TABS: 0.7500 mg | ORAL_TABLET | Freq: Every day | ORAL | 1 refills | Status: DC

## 2018-07-13 MED ORDER — FUROSEMIDE 20 MG PO TABS: ORAL_TABLET | ORAL | 3 refills | Status: DC

## 2018-07-13 NOTE — Telephone Encounter (Signed)
Furosemide refill Last Refill:06/19/18 # 90 Last OV: 06/19/18 PCP: Dr Betty Martinique Pharmacy:[facility] [address] [place], [place]   Ropinrole refill Last Refill:03/15/48 # 180 Last OV: 06/19/18 [facility] [address] [place], [place]:

## 2018-07-13 NOTE — Telephone Encounter (Signed)
Copied from [place] [id number]. Topic: Quick Communication - Rx Refill/Question >> Jul 13, 2018 10:06 AM Alfredia Ferguson R wrote: Medication: rOPINIRole (REQUIP) 0.25 MG tablet ; furosemide (LASIX) 20 MG tablet  Has the patient contacted their pharmacy? Yes  Preferred Pharmacy (with phone number or street name): [facility] [place] [facility] [place] [phone] (Phone) [phone] (Fax)   Agent: Please be advised that RX refills may take up to 3 business days. We ask that you follow-up with your pharmacy.

## 2018-07-13 NOTE — Telephone Encounter (Signed)
Rx's sent to pharmacy as requested. 

## 2018-08-04 ENCOUNTER — Ambulatory Visit (INDEPENDENT_AMBULATORY_CARE_PROVIDER_SITE_OTHER): Payer: Medicare HMO | Admitting: Family Medicine

## 2018-08-04 ENCOUNTER — Encounter: Payer: Self-pay | Admitting: Family Medicine

## 2018-08-04 VITALS — BP 122/70 | HR 90 | Temp 98.6°F | Resp 16 | Ht 62.0 in | Wt 168.5 lb

## 2018-08-04 DIAGNOSIS — M79605 Pain in left leg: Secondary | ICD-10-CM | POA: Diagnosis not present

## 2018-08-04 DIAGNOSIS — G8929 Other chronic pain: Secondary | ICD-10-CM

## 2018-08-04 DIAGNOSIS — K5900 Constipation, unspecified: Secondary | ICD-10-CM | POA: Diagnosis not present

## 2018-08-04 DIAGNOSIS — R1030 Lower abdominal pain, unspecified: Secondary | ICD-10-CM | POA: Diagnosis not present

## 2018-08-04 DIAGNOSIS — M5440 Lumbago with sciatica, unspecified side: Secondary | ICD-10-CM | POA: Diagnosis not present

## 2018-08-04 MED ORDER — PREDNISONE 20 MG PO TABS: ORAL_TABLET | ORAL | 0 refills | Status: AC

## 2018-08-04 NOTE — Patient Instructions (Signed)
A few things to remember from today's visit:   Lower abdominal pain - Plan: Urinalysis, Routine w reflex microscopic, predniSONE (DELTASONE) 20 MG tablet  Acute pain of left lower extremity  Spinal stenosis of lumbar region with neurogenic claudication - Plan: predniSONE (DELTASONE) 20 MG tablet  Constipation, unspecified constipation type  Add Miralax daily and benefiber 2 times daily. No changes in Linzess.  Please arrange appointment with Dr. Lorin Mercy if left leg groin pain continue.   Please be sure medication list is accurate. If a new problem present, please set up appointment sooner than planned today.

## 2018-08-04 NOTE — Assessment & Plan Note (Addendum)
Recommend arrange an appointment with Dr. Lorin Mercy to discuss other treatment options given the fact she is having new radicular symptoms (per patient report).  Another option is to arrange appointment with pain management, she prefers to hold on this one for now. Side effects of prednisone discussed. Instructed about warning signs.

## 2018-08-04 NOTE — Progress Notes (Signed)
ACUTE VISIT   HPI:  Chief Complaint  Patient presents with  . Abdominal Pain    lower abdomen that radiates to lower back, achy /cramping feeling, started 5 days ago  . Leg Pain    left leg pain that started 4 or 5 days ago    [name] is a 67 y.o. female, who is here today with her husband complaining of new onset of lower abdominal pain and left lower extremity pain. Her husband helps with providing history.  She is not sure if symptoms are related.  Left lower extremity the pain started about 2 weeks ago, it is intermittent, worse at night when she is in bed, throbbing/needle sensation, severe, it interferes with his sleep.   5 days of lower abdominal pain (she points to groin areas) is sharp, constant, 8/10, it seems to be coming from her lower back. She denies saddle anesthesia or changes in urine/bowel continence. She denies associated nausea, vomiting, dysuria, gross hematuria, or increasing urinary frequency. Lower abdominal pain seems to be worse when her bladder is full and better after urination. She has not taking any OTC medication for pain.  History of chronic lower back pain/lumbar stenosis, she underwent L4-5 decompression surgery on 01/19/2018.  She is reporting no improvement after surgery. She is currently on Cymbalta 60 mg daily. In the past she has tried gabapentin, she did not think medication was helping.  According to patient, she was told that for the surgical treatment was not an option due to possible complications.  She is also complaining about constipation, last bowel movement was yesterday after taking Linzess 290 mcg and 145 mcg (she had left from all prescription). She was referred to GI, which she does not recall initially. History of IBS with constipation mainly but also episodes of diarrhea.  Diffuse abdominal cramps, intermittently, alleviated by defecation. She has not identified exacerbating factors.   Review of Systems   Constitutional: Positive for fatigue. Negative for activity change, appetite change and fever.  HENT: Negative for mouth sores, nosebleeds, sore throat and trouble swallowing.   Respiratory: Negative for shortness of breath and wheezing.   Cardiovascular: Positive for leg swelling (stable). Negative for chest pain and palpitations.  Gastrointestinal: Positive for abdominal pain and constipation. Negative for nausea and vomiting.       Negative for changes in bowel habits.  Genitourinary: Negative for decreased urine volume, dysuria and hematuria.  Musculoskeletal: Positive for back pain. Negative for joint swelling.  Skin: Negative for rash and wound.  Neurological: Negative for syncope, weakness and headaches.  Psychiatric/Behavioral: Positive for sleep disturbance. The patient is nervous/anxious.       Current Outpatient Medications on File Prior to Visit  Medication Sig Dispense Refill  . ALPRAZolam (XANAX) 0.5 MG tablet TAKE 1 TABLET BY MOUTH TWICE DAILY AS NEEDED FOR ANXIETY. NO MORE THAN 40 TABLETS PER MONTH 40 tablet 2  . amiodarone (PACERONE) 200 MG tablet Take 1/2 tablet (100 mg) by mouth once daily (Patient taking differently: Take 100 mg by mouth daily. Take 1/2 tablet (100 mg) by mouth once daily) 90 tablet 3  . aspirin EC 81 MG tablet Take 81 mg by mouth daily.    Marland Kitchen atorvastatin (LIPITOR) 80 MG tablet TAKE 1 TABLET EVERY DAY 90 tablet 1  . Cholecalciferol (VITAMIN D) 2000 units CAPS Take 2,000 Units by mouth daily.    . cycloSPORINE (RESTASIS) 0.05 % ophthalmic emulsion Place 1 drop into both eyes 2 (two)  times daily.    Marland Kitchen DEXILANT 30 MG capsule TAKE 1 CAPSULE(30 MG) BY MOUTH DAILY 90 capsule 2  . doxepin (SINEQUAN) 10 MG capsule TAKE 2 CAPSULES(20 MG) BY MOUTH AT BEDTIME 180 capsule 2  . DULoxetine (CYMBALTA) 60 MG capsule TAKE 1 CAPSULE EVERY DAY 90 capsule 1  . Fluticasone-Umeclidin-Vilant (TRELEGY ELLIPTA) 100-62.5-25 MCG/INH AEPB Inhale 1 puff into the lungs daily. 60  each 5  . furosemide (LASIX) 20 MG tablet 1 tab 3 times per day. 90 tablet 3  . ibuprofen (ADVIL,MOTRIN) 200 MG tablet Take 400-600 mg by mouth every 8 (eight) hours as needed for headache or mild pain (2-3 tablets depending on pain).    Marland Kitchen linaclotide (LINZESS) 290 MCG CAPS capsule Take 1 capsule (290 mcg total) by mouth daily before breakfast. 30 capsule 1  . Multiple Vitamin (MULTIVITAMIN) tablet Take 1 tablet by mouth daily.    . nitroGLYCERIN (NITROSTAT) 0.4 MG SL tablet Place 0.4 mg under the tongue every 5 (five) minutes as needed for chest pain. Reported on 06/12/2016    . potassium chloride (K-DUR) 10 MEQ tablet Take 1 tablet (10 mEq total) by mouth daily. 30 tablet 3  . rOPINIRole (REQUIP) 0.25 MG tablet Take 3 tablets (0.75 mg total) by mouth at bedtime. 180 tablet 1  . tiZANidine (ZANAFLEX) 4 MG tablet Take 1 tablet (4 mg total) by mouth every 12 (twelve) hours as needed for muscle spasms. 30 tablet 0  . VENTOLIN HFA 108 (90 Base) MCG/ACT inhaler INHALE 2 PUFFS INTO THE LUNGS EVERY 6 HOURS AS NEEDED FOR WHEEZING OR SHORTNESS OF BREATH 18 g 0   No current facility-administered medications on file prior to visit.      Past Medical History:  Diagnosis Date  . Anemia   . Anxiety   . Arthritis    "back"  . Atrial flutter ([place]) 03/13/2016  . CAD (coronary artery disease)   . Cancer ([place])    skin cancer  . Cataract   . Colon polyp   . COPD (chronic obstructive pulmonary disease) ([place])   . Depression   . Dysrhythmia   . Emphysema of lung ([place])   . GERD (gastroesophageal reflux disease)   . Hypercholesterolemia   . Osteoporosis   . Oxygen deficiency    No Known Allergies  Past Surgical History:  Procedure Laterality Date  . A-FLUTTER ABLATION N/A 05/19/2017   Procedure: A-Flutter Ablation;  Surgeon: Evans Lance, MD;  Location: [facility];  Service: Cardiovascular;  Laterality: N/A;  . APPENDECTOMY    . BASAL CELL CARCINOMA EXCISION    . CARDIAC CATHETERIZATION     . CATARACT EXTRACTION Bilateral   . CHOLECYSTECTOMY    . COLONOSCOPY N/A 06/23/2014   Dr. Rourk:Rectal and colonic polyps-removed, Pancolonic diverticulosis. lymphocytic colitis  . CORONARY ANGIOPLASTY WITH STENT PLACEMENT    . ESOPHAGEAL DILATION N/A 03/13/2016   Procedure: ESOPHAGEAL DILATION;  Surgeon: Daneil Dolin, MD;  [facility];  Service: Endoscopy;  Laterality: N/A;  . ESOPHAGOGASTRODUODENOSCOPY N/A 03/13/2016   Procedure: ESOPHAGOGASTRODUODENOSCOPY (EGD);  Surgeon: Daneil Dolin, MD;  [facility];  Service: Endoscopy;  Laterality: N/A;  0930  . EYE SURGERY     cataract removed, bilateral  . LUMBAR LAMINECTOMY/DECOMPRESSION MICRODISCECTOMY N/A 01/19/2018   Procedure: L4-5 DECOMPRESSION;  Surgeon: Marybelle Killings, MD;  Location: [facility];  Service: Orthopedics;  Laterality: N/A;    Social History   Socioeconomic History  . Marital status: Married  Spouse name: Not on file  . Number of children: 3  . Years of education: 10  . Highest education level: Not on file  Occupational History  . Occupation: retired    Comment: customer service [employer]  . Financial resource strain: Not on file  . Food insecurity:    Worry: Not on file    Inability: Not on file  . Transportation needs:    Medical: Not on file    Non-medical: Not on file  Tobacco Use  . Smoking status: Current Every Day Smoker    Packs/day: 1.50    Years: 45.00    Pack years: 67.50    Types: Cigarettes    Start date: 06/14/1966  . Smokeless tobacco: Never Used  . Tobacco comment: 1-2 packs daily  Substance and Sexual Activity  . Alcohol use: No    Alcohol/week: 0.0 standard drinks  . Drug use: No  . Sexual activity: Yes    Birth control/protection: Post-menopausal  Lifestyle  . Physical activity:    Days per week: Not on file    Minutes per session: Not on file  . Stress: Not on file  Relationships  . Social connections:    Talks on phone: Not on file    Gets  together: Not on file    Attends religious service: Not on file    Active member of club or organization: Not on file    Attends meetings of clubs or organizations: Not on file    Relationship status: Not on file  Other Topics Concern  . Not on file  Social History Narrative   Quit high school and got married in 10th grade, age 67   First husband died of colon cancer at young age   Married again 2018   Lives in own home   Daughter lives with her   Drinks coffee seldom, too much "sweet tea"    Vitals:   08/04/18 1554  BP: 122/70  Pulse: 90  Resp: 16  Temp: 98.6 F (37 C)  SpO2: 94%   Body mass index is 30.82 kg/m.   Physical Exam  Nursing note and vitals reviewed. Constitutional: She is oriented to person, place, and time. She appears well-developed. She does not appear ill. No distress.  HENT:  Head: Atraumatic.  Eyes: Pupils are equal, round, and reactive to light. Conjunctivae and EOM are normal.  Cardiovascular:  DP pulses present bilateral.  Respiratory: Effort normal and breath sounds normal. No respiratory distress.  GI: Soft. She exhibits no mass. There is no hepatomegaly. There is generalized tenderness. There is no rigidity, no rebound, no guarding and no CVA tenderness. No hernia.  Musculoskeletal: She exhibits edema (2+ pitting LE edema,bilateral).       Lumbar back: She exhibits decreased range of motion and tenderness. She exhibits no edema.       Back:  No significant deformity appreciated,healed mid lower back scar. There is tenderness upon palpation of paraspinal muscles. Pain elicited with movement on exam table during examination. No local edema or erythema appreciated, no suspicious lesions.    Lymphadenopathy:    She has no cervical adenopathy.  Neurological: She is alert and oriented to person, place, and time. She has normal strength. Gait normal.  Reflex Scores:      Patellar reflexes are 2+ on the right side and 2+ on the left side. SLR  negative bilateral.  Skin: Skin is warm. No rash noted. No erythema.  Psychiatric: Her mood  appears anxious.  Well groomed, good eye contact.      ASSESSMENT AND PLAN:  Ms. Doninique was seen today for abdominal pain and leg pain.   Lower abdominal pain  Possible etiology discussed. ?  IBS, radicular pain among some. We are treating constipation as well as left lower extremity pain (radicular). Further recommendation will be given according to UA results. Clearly instructed about warning signs.  -     Urinalysis, Routine w reflex microscopic   Acute pain of left lower extremity  She is reporting this problem as new. We discussed possible etiologies, history suggest radicular pain. After discussion of some side effects, she agrees with trying prednisone taper. Instructed about warning signs. She will follow with Dr. Lorin Mercy if needed.   Constipation Chronic. She will continue Linzess 229 mcg daily, recommend following instructions about dose. MiraLAX once daily added. Also recommend Benefiber twice daily. Instructed about warning signs.  Back pain Recommend arrange an appointment with Dr. Lorin Mercy to discuss other treatment options given the fact she is having new radicular symptoms (per patient report).  Another option is to arrange appointment with pain management, she prefers to hold on this one for now. Side effects of prednisone discussed. Instructed about warning signs.    Return if symptoms worsen or fail to improve, for Keep next follow-up appointment.Marland Kitchen  4:38 pm-5:18 pm. 40 min face to face OV. > 50% was dedicated to discussion of above Dx+ differential, prognosis, treatment options, and some side effects of medications. We went through her records to review visits with Dr. Lorin Mercy as well as GI. For now she is not interested in referral to pain management, we discussed side effects of opioid medications.       Meriel Kelliher G. [place], MD  [facility]. [place] office.

## 2018-08-04 NOTE — Assessment & Plan Note (Signed)
Chronic. She will continue Linzess 229 mcg daily, recommend following instructions about dose. MiraLAX once daily added. Also recommend Benefiber twice daily. Instructed about warning signs.

## 2018-08-05 LAB — URINALYSIS, ROUTINE W REFLEX MICROSCOPIC
Bilirubin Urine: NEGATIVE
Hgb urine dipstick: NEGATIVE
KETONES UR: NEGATIVE
LEUKOCYTES UA: NEGATIVE
Nitrite: NEGATIVE
PH: 6 (ref 5.0–8.0)
RBC / HPF: NONE SEEN (ref 0–?)
SPECIFIC GRAVITY, URINE: 1.02 (ref 1.000–1.030)
TOTAL PROTEIN, URINE-UPE24: NEGATIVE
Urine Glucose: NEGATIVE
Urobilinogen, UA: 0.2 (ref 0.0–1.0)

## 2018-08-06 ENCOUNTER — Encounter: Payer: Self-pay | Admitting: Family Medicine

## 2018-08-07 DIAGNOSIS — J449 Chronic obstructive pulmonary disease, unspecified: Secondary | ICD-10-CM | POA: Diagnosis not present

## 2018-08-13 ENCOUNTER — Other Ambulatory Visit: Payer: Self-pay | Admitting: Emergency Medicine

## 2018-08-14 ENCOUNTER — Telehealth: Payer: Self-pay | Admitting: Family Medicine

## 2018-08-14 ENCOUNTER — Other Ambulatory Visit: Payer: Self-pay | Admitting: Family Medicine

## 2018-08-14 NOTE — Telephone Encounter (Signed)
Left message for patient to give me a call back concerning medication.

## 2018-08-14 NOTE — Telephone Encounter (Signed)
Copied from [place] [phone]. Topic: General - Other >> Aug 14, 2018  9:53 AM Yvette Rack wrote: Reason for CRM: pt calling stating that the provider wanted her to stop taking one of these medicines panpoprazole or DEXILANT 30 MG capsule and want to know which one please call her at [phone]

## 2018-08-14 NOTE — Telephone Encounter (Signed)
Spoke with patient and gave directions from previous visit to stop Protonix. Patient verbalized understanding.

## 2018-09-06 DIAGNOSIS — J449 Chronic obstructive pulmonary disease, unspecified: Secondary | ICD-10-CM | POA: Diagnosis not present

## 2018-09-08 ENCOUNTER — Other Ambulatory Visit: Payer: Self-pay | Admitting: Family Medicine

## 2018-09-08 DIAGNOSIS — F419 Anxiety disorder, unspecified: Secondary | ICD-10-CM

## 2018-09-09 ENCOUNTER — Telehealth: Payer: Self-pay | Admitting: Family Medicine

## 2018-09-09 ENCOUNTER — Other Ambulatory Visit: Payer: Self-pay | Admitting: Family Medicine

## 2018-09-09 MED ORDER — ATORVASTATIN CALCIUM 80 MG PO TABS: 80.0000 mg | ORAL_TABLET | Freq: Every day | ORAL | 1 refills | Status: DC

## 2018-09-09 NOTE — Telephone Encounter (Signed)
Rx for Alprazolam 0.5 mg was sent to her pharmacy. Last refill 08/13/18, 5 days earlier (07/20/18). Due for next refill 09/17/18.  Thanks, BJ

## 2018-09-09 NOTE — Telephone Encounter (Signed)
Copied from [place] [phone]. Topic: General - Other >> Sep 09, 2018  9:49 AM Ivar Drape wrote: Reason for CRM:   Patient would like a refill on her atorvastatin (LIPITOR) 80 MG tablet medication and have it sent to her preferred pharmacy [facility] on [address].  She would like ALL future prescriptions to be sent to this pharmacy.

## 2018-09-09 NOTE — Telephone Encounter (Signed)
Requested medication (s) are due for refill today: Yes  Requested medication (s) are on the active medication list: Yes  Last refill:  06/01/18  Future visit scheduled: Yes  Notes to clinic:  See request    Requested Prescriptions  Pending Prescriptions Disp Refills   ALPRAZolam (XANAX) 0.5 MG tablet [Pharmacy Med Name: ALPRAZOLAM 0.5MG  TABLETS] 40 tablet 0    Sig: TAKE 1 TABLET BY MOUTH TWICE DAILY AS NEEDED FOR ANXIETY. NO MORE THAN 40 TABLETS PER MONTH     Not Delegated - Psychiatry:  Anxiolytics/Hypnotics Failed - 09/09/2018 10:00 AM      Failed - This refill cannot be delegated      Failed - Urine Drug Screen completed in last 360 days.      Passed - Valid encounter within last 6 months    Recent Outpatient Visits          1 month ago Lower abdominal pain   [facility], Malka So, MD   2 months ago Constipation, unspecified constipation type   [facility], Malka So, MD   6 months ago Bilateral lower extremity edema   [facility], Malka So, MD   8 months ago Nausea without vomiting   [facility], Malka So, MD   8 months ago Diarrhea, unspecified type   [facility], Malka So, MD      Future Appointments            In 1 month [place], Malka So, MD [place] at [place], [place]          atorvastatin (LIPITOR) 80 MG tablet 30 tablet 1    Sig: Take 1 tablet (80 mg total) by mouth daily.     Cardiovascular:  Antilipid - Statins Failed - 09/09/2018 10:00 AM      Failed - Total Cholesterol in normal range and within 360 days    Cholesterol  Date Value Ref Range Status  11/07/2016 163 <200 mg/dL Final         Failed - LDL in normal range and within 360 days    LDL Cholesterol  Date Value Ref Range Status  11/07/2016 85 <100 mg/dL Final         Failed - HDL in normal range and within 360 days    HDL  Date Value Ref Range  Status  11/07/2016 38 (L) >50 mg/dL Final         Failed - Triglycerides in normal range and within 360 days    Triglycerides  Date Value Ref Range Status  11/07/2016 202 (H) <150 mg/dL Final         Passed - Patient is not pregnant      Passed - Valid encounter within last 12 months    Recent Outpatient Visits          1 month ago Lower abdominal pain   [facility], Malka So, MD   2 months ago Constipation, unspecified constipation type   [facility], Malka So, MD   6 months ago Bilateral lower extremity edema   [facility]rap[facility], Malka So, MD   8 months ago Nausea without vomiting   [facility], Malka So, MD   8 months ago Diarrhea, unspecified type   [facility]rap[facility], Malka So, MD      Future Appointments  In 1 month [place], Malka So, MD [employer] at [place], [facility]

## 2018-09-09 NOTE — Telephone Encounter (Unsigned)
Copied from [place] [id number]. Topic: General - Other >> Sep 09, 2018  9:49 AM Ivar Drape wrote: Reason for CRM:   Patient would like a refill on her atorvastatin (LIPITOR) 80 MG tablet medication and have it sent to her preferred pharmacy [facility] on [address].  She would like ALL future prescriptions to be sent to this pharmacy.

## 2018-09-19 ENCOUNTER — Other Ambulatory Visit: Payer: Self-pay | Admitting: Family Medicine

## 2018-10-05 ENCOUNTER — Other Ambulatory Visit: Payer: Self-pay | Admitting: Family Medicine

## 2018-10-05 DIAGNOSIS — F419 Anxiety disorder, unspecified: Secondary | ICD-10-CM

## 2018-10-05 DIAGNOSIS — M5440 Lumbago with sciatica, unspecified side: Secondary | ICD-10-CM

## 2018-10-05 DIAGNOSIS — G8929 Other chronic pain: Secondary | ICD-10-CM

## 2018-10-05 NOTE — Telephone Encounter (Signed)
Copied from [place] [id number]. Topic: Quick Communication - Rx Refill/Question >> Oct 05, 2018  9:33 AM Scherrie Gerlach wrote: Medication: DULoxetine (CYMBALTA) 60 MG capsule  Pt wants this to go to a different pharmacy this time. Pt wants sent to [facility], [place] AT [facility] [place] [phone] (Phone) [phone] (Fax)

## 2018-10-06 MED ORDER — DULOXETINE HCL 60 MG PO CPEP: 60.0000 mg | ORAL_CAPSULE | Freq: Every day | ORAL | 1 refills | Status: DC

## 2018-10-06 NOTE — Telephone Encounter (Signed)
Requested Prescriptions  Pending Prescriptions Disp Refills  . DULoxetine (CYMBALTA) 60 MG capsule 90 capsule 1    Sig: Take 1 capsule (60 mg total) by mouth daily.     Psychiatry: Antidepressants - SNRI Passed - 10/05/2018 11:12 AM      Passed - Last BP in normal range    BP Readings from Last 1 Encounters:  08/04/18 122/70         Passed - Valid encounter within last 6 months    Recent Outpatient Visits          2 months ago Lower abdominal pain   [facility], Malka So, MD   3 months ago Constipation, unspecified constipation type   [facility]rap[facility], Malka So, MD   7 months ago Bilateral lower extremity edema   [facility], Malka So, MD   8 months ago Nausea without vomiting   [facility]rap[facility], Malka So, MD   9 months ago Diarrhea, unspecified type   [facility] at [facility], Malka So, MD      Future Appointments            In 2 weeks [place], Malka So, MD [employer] at [place], [facility]

## 2018-10-07 DIAGNOSIS — J449 Chronic obstructive pulmonary disease, unspecified: Secondary | ICD-10-CM | POA: Diagnosis not present

## 2018-10-07 DIAGNOSIS — L600 Ingrowing nail: Secondary | ICD-10-CM | POA: Diagnosis not present

## 2018-10-07 DIAGNOSIS — M79674 Pain in right toe(s): Secondary | ICD-10-CM | POA: Diagnosis not present

## 2018-10-07 DIAGNOSIS — M25774 Osteophyte, right foot: Secondary | ICD-10-CM | POA: Diagnosis not present

## 2018-10-19 ENCOUNTER — Other Ambulatory Visit: Payer: Self-pay | Admitting: Family Medicine

## 2018-10-20 ENCOUNTER — Ambulatory Visit (INDEPENDENT_AMBULATORY_CARE_PROVIDER_SITE_OTHER): Payer: Medicare HMO | Admitting: Family Medicine

## 2018-10-20 ENCOUNTER — Encounter: Payer: Self-pay | Admitting: Family Medicine

## 2018-10-20 VITALS — BP 130/70 | HR 88 | Temp 97.9°F | Resp 16 | Ht 62.0 in | Wt 168.2 lb

## 2018-10-20 DIAGNOSIS — Z780 Asymptomatic menopausal state: Secondary | ICD-10-CM | POA: Diagnosis not present

## 2018-10-20 DIAGNOSIS — Z1239 Encounter for other screening for malignant neoplasm of breast: Secondary | ICD-10-CM

## 2018-10-20 DIAGNOSIS — F419 Anxiety disorder, unspecified: Secondary | ICD-10-CM

## 2018-10-20 DIAGNOSIS — G894 Chronic pain syndrome: Secondary | ICD-10-CM

## 2018-10-20 DIAGNOSIS — Z23 Encounter for immunization: Secondary | ICD-10-CM

## 2018-10-20 DIAGNOSIS — E876 Hypokalemia: Secondary | ICD-10-CM

## 2018-10-20 DIAGNOSIS — M48061 Spinal stenosis, lumbar region without neurogenic claudication: Secondary | ICD-10-CM

## 2018-10-20 DIAGNOSIS — Z9189 Other specified personal risk factors, not elsewhere classified: Secondary | ICD-10-CM | POA: Diagnosis not present

## 2018-10-20 DIAGNOSIS — Z0000 Encounter for general adult medical examination without abnormal findings: Secondary | ICD-10-CM

## 2018-10-20 DIAGNOSIS — Z1159 Encounter for screening for other viral diseases: Secondary | ICD-10-CM

## 2018-10-20 MED ORDER — ROPINIROLE HCL 0.25 MG PO TABS: 0.5000 mg | ORAL_TABLET | Freq: Every day | ORAL | 0 refills | Status: DC

## 2018-10-20 NOTE — Patient Instructions (Addendum)
A few things to remember from today's visit:   Routine general medical examination at a health care facility  Chronic anxiety  Chronic pain disorder - Plan: Ambulatory referral to Pain Clinic  Spinal stenosis of lumbar region, unspecified whether neurogenic claudication present  Hypokalemia - Plan: Basic metabolic panel  Encounter for HCV screening test for high risk patient - Plan: Hepatitis C antibody  Asymptomatic postmenopausal estrogen deficiency - Plan: DEXAScan  Breast cancer screening - Plan: Mammogram Digital Screening  Medicare annual wellness visit, subsequent   A few tips:  -As we age balance is not as good as it was, so there is a higher risks for falls. Please remove small rugs and furniture that is "in your way" and could increase the risk of falls. Stretching exercises may help with fall prevention: Yoga and Tai Chi are some examples. Low impact exercise is better, so you are not very achy the next day.  -Sun screen and avoidance of direct sun light recommended. Caution with dehydration, if working outdoors be sure to drink enough fluids.  - Some medications are not safe as we age, increases the risk of side effects and can potentially interact with other medication you are also taken;  including some of over the counter medications. Be sure to let me know when you start a new medication even if it is a dietary/vitamin supplement.   -Healthy diet low in red meet/animal fat and sugar + regular physical activity is recommended.       Screening schedule for the next 5-10 years:  Colonoscopy in 06/2019  Glaucoma screening/eye exam every year at least.  Mammogram for breast cancer screening annually.  Flu vaccine annually.  Diabetes screening q 1-3 years.   Fall prevention Bone density will be schedule.  Calcium 1000-1200 mg daily,ideally through your diet.\  Decrease requip from 3 tabs to 2 tabs at bedtime.            

## 2018-10-20 NOTE — Progress Notes (Signed)
HPI:   Tonya Bradley is a 67 y.o. female, who is here today for her routine physical.  Last CPE: 03/12/2017  Regular exercise 3 or more time per week: She walks about 4 times per week. Following a healthy diet: Not consistently. She lives with her husband.  Chronic medical problems: CAD,atrial fib, chronic back pain with radiculopathy,constipation,GERD, and COPD among some. Still smoking.   Immunization History  Administered Date(s) Administered  . Influenza Whole 08/02/2016  . Influenza, High Dose Seasonal PF 09/10/2017, 10/20/2018  . Influenza, Seasonal, Injecte, Preservative Fre 11/15/2015  . Influenza,inj,Quad PF,6+ Mos 11/15/2015  . Pneumococcal Conjugate-13 03/12/2017  . Pneumococcal Polysaccharide-23 06/16/2012, 10/20/2018    Mammogram: 11/2016 Colonoscopy: 06/23/14 DEXA12/2017 osteopenia.  Hep C screening:Does not recall beinngscreened.  She has a few concerns today.  Forgetful for a while but her husband  Misplacing objects. Forgetting that she has eye glasses on.   Once in a while she forgets to pay bills. "Very seldom."  Lower extremities throbbing pain, it is constant but worse at night.  9-10/10. Pain interferes with sleep, alleviated by walking for a few minutes. Hx of lumbar spine stenosis. S/P lumbar laminectomy for spinal cord decompression in 01/2018, symptoms improved for a few months after surgery. She denies saddle anesthesia or changes in bowel/urine continence.  She takes Zanaflex 4 mg bid prn for back pain.  She has hx of RLS and currently on Requip 1.5 mg. Leg pain is not alleviated by legs movement,she has to get up and walk. She does not think Requip is helping.  LE edema,bilateral. Worse with prolong standing and alleviated by LE elevation.   HypoK+, she is on Furosemide 20 mg bid.  Vein disease,she has followed with vascular.  Anxiety,she is on Xanax 0.5 mg bid as needed, no more than 40 tabs per month. She is also  on Cymbalta 60 mg daily. Insomnia,she is on Doxepain 30 mg daily , which has helped.  She is tolerating medications well, no side effects reported.    Medicare preventive visit.  She enjoys buying on line,reading,and playing games on line. She is active at church.  Functional Status Survey: Is the patient deaf or have difficulty hearing?: Yes Does the patient have difficulty seeing, even when wearing glasses/contacts?: No Does the patient have difficulty concentrating, remembering, or making decisions?: Yes(missplacing items) Does the patient have difficulty walking or climbing stairs?: Yes(OA) Does the patient have difficulty dressing or bathing?: No Does the patient have difficulty doing errands alone such as visiting a doctor's office or shopping?: No  Fall Risk  10/20/2018 03/12/2017 11/07/2016  Falls in the past year? 0 Yes Yes  Number falls in past yr: 0 1 1  Injury with Fall? 0 No Yes  Follow up Education provided Falls evaluation completed;Education provided;Falls prevention discussed -     Providers she sees regularly:   Eye care provider: Edwyna Shell (doesnot remember name of provider).She follows q 6 months.  Dr Mammie Russian. Dr Byrum,pulmonologist.Last seen 01/2018. Dr Taylor,cardiologist.Last seen 01/2018 [facility],podiatrst.   Depression screen [facility] 2/9 10/24/2018  Decreased Interest 0  Down, Depressed, Hopeless 0  PHQ - 2 Score 0  Altered sleeping -  Tired, decreased energy -  Change in appetite -  Feeling bad or failure about yourself  -  Trouble concentrating -  Moving slowly or fidgety/restless -  Suicidal thoughts -  PHQ-9 Score -  Difficult doing work/chores -    Mini-Cog - 10/20/18 1633  Normal clock drawing test?  yes    How many words correct?  3         Visual Acuity Screening   Right eye Left eye Both eyes  Without correction: 20/30 20/50 20/30   With correction:       Review of Systems  Constitutional: Negative for  activity change, appetite change, fatigue and fever.  HENT: Negative for mouth sores, nosebleeds and trouble swallowing.   Eyes: Negative for redness and visual disturbance.  Respiratory: Positive for cough (stable). Negative for shortness of breath and wheezing.   Cardiovascular: Negative for chest pain, palpitations and leg swelling.  Gastrointestinal: Negative for abdominal pain, nausea and vomiting.       Negative for changes in bowel habits.  Endocrine: Negative for polydipsia, polyphagia and polyuria.  Genitourinary: Negative for decreased urine volume, dysuria and hematuria.  Musculoskeletal: Positive for arthralgias and back pain. Negative for joint swelling.  Skin: Negative for rash.  Allergic/Immunologic: Positive for environmental allergies.  Neurological: Negative for syncope, weakness and headaches.  Psychiatric/Behavioral: Positive for sleep disturbance. Negative for confusion. The patient is nervous/anxious.   All other systems reviewed and are negative.     Current Outpatient Medications on File Prior to Visit  Medication Sig Dispense Refill  . ALPRAZolam (XANAX) 0.5 MG tablet TAKE 1 TABLET BY MOUTH TWICE DAILY AS NEEDED FOR ANXIETY. NO MORE THAN 40 TABLETS PER MONTH 40 tablet 2  . amiodarone (PACERONE) 200 MG tablet Take 1/2 tablet (100 mg) by mouth once daily (Patient taking differently: Take 100 mg by mouth daily. Take 1/2 tablet (100 mg) by mouth once daily) 90 tablet 3  . aspirin EC 81 MG tablet Take 81 mg by mouth daily.    Marland Kitchen atorvastatin (LIPITOR) 80 MG tablet TAKE 1 TABLET(80 MG) BY MOUTH DAILY 90 tablet 1  . Cholecalciferol (VITAMIN D) 2000 units CAPS Take 2,000 Units by mouth daily.    . cycloSPORINE (RESTASIS) 0.05 % ophthalmic emulsion Place 1 drop into both eyes 2 (two) times daily.    Marland Kitchen DEXILANT 30 MG capsule TAKE 1 CAPSULE(30 MG) BY MOUTH DAILY 90 capsule 2  . doxepin (SINEQUAN) 10 MG capsule TAKE 2 CAPSULES(20 MG) BY MOUTH AT BEDTIME 180 capsule 2  .  DULoxetine (CYMBALTA) 60 MG capsule Take 1 capsule (60 mg total) by mouth daily. 90 capsule 1  . furosemide (LASIX) 20 MG tablet 1 tab 3 times per day. 90 tablet 3  . ibuprofen (ADVIL,MOTRIN) 200 MG tablet Take 400-600 mg by mouth every 8 (eight) hours as needed for headache or mild pain (2-3 tablets depending on pain).    Marland Kitchen LINZESS 290 MCG CAPS capsule TAKE 1 CAPSULE(290 MCG) BY MOUTH DAILY BEFORE BREAKFAST 30 capsule 0  . Multiple Vitamin (MULTIVITAMIN) tablet Take 1 tablet by mouth daily.    . potassium chloride (K-DUR) 10 MEQ tablet TAKE 1 TABLET(10 MEQ) BY MOUTH DAILY 90 tablet 2  . tiZANidine (ZANAFLEX) 4 MG tablet Take 1 tablet (4 mg total) by mouth every 12 (twelve) hours as needed for muscle spasms. 30 tablet 0  . TRELEGY ELLIPTA 100-62.5-25 MCG/INH AEPB INHALE 1 PUFF INTO THE LUNGS DAILY 60 each 2  . VENTOLIN HFA 108 (90 Base) MCG/ACT inhaler INHALE 2 PUFFS INTO THE LUNGS EVERY 6 HOURS AS NEEDED FOR WHEEZING OR SHORTNESS OF BREATH 18 g 0   No current facility-administered medications on file prior to visit.      Past Medical History:  Diagnosis Date  . Anemia   .  Anxiety   . Arthritis    "back"  . Atrial flutter ([place]) 03/13/2016  . CAD (coronary artery disease)   . Cancer ([place])    skin cancer  . Cataract   . Colon polyp   . COPD (chronic obstructive pulmonary disease) ([place])   . Depression   . Dysrhythmia   . Emphysema of lung ([place])   . GERD (gastroesophageal reflux disease)   . Hypercholesterolemia   . Osteoporosis   . Oxygen deficiency     Past Surgical History:  Procedure Laterality Date  . A-FLUTTER ABLATION N/A 05/19/2017   Procedure: A-Flutter Ablation;  Surgeon: Evans Lance, MD;  Location: [facility];  Service: Cardiovascular;  Laterality: N/A;  . APPENDECTOMY    . BASAL CELL CARCINOMA EXCISION    . CARDIAC CATHETERIZATION    . CATARACT EXTRACTION Bilateral   . CHOLECYSTECTOMY    . COLONOSCOPY N/A 06/23/2014   Dr. Rourk:Rectal and colonic  polyps-removed, Pancolonic diverticulosis. lymphocytic colitis  . CORONARY ANGIOPLASTY WITH STENT PLACEMENT    . ESOPHAGEAL DILATION N/A 03/13/2016   Procedure: ESOPHAGEAL DILATION;  Surgeon: Daneil Dolin, MD;  Location[facility];  Service: Endoscopy;  Laterality: N/A;  . ESOPHAGOGASTRODUODENOSCOPY N/A 03/13/2016   Procedure: ESOPHAGOGASTRODUODENOSCOPY (EGD);  Surgeon: Daneil Dolin, MD;  Location[facility];  Service: Endoscopy;  Laterality: N/A;  0930  . EYE SURGERY     cataract removed, bilateral  . LUMBAR LAMINECTOMY/DECOMPRESSION MICRODISCECTOMY N/A 01/19/2018   Procedure: L4-5 DECOMPRESSION;  Surgeon: Marybelle Killings, MD;  Location: [facility];  Service: Orthopedics;  Laterality: N/A;    No Known Allergies  Family History  Problem Relation Age of Onset  . Breast cancer Sister 28  . Heart disease Sister   . Heart attack Father   . Heart disease Father   . Stroke Mother   . Heart disease Mother   . Hyperlipidemia Mother   . Heart attack Sister   . Heart disease Brother        ?heart failure  . Congestive Heart Failure Brother   . Hypertension Son   . Heart disease Maternal Grandfather   . Cancer Cousin        lymphoma  . Hypertension Son   . Stomach cancer Other   . Colon cancer Neg Hx     Social History   Socioeconomic History  . Marital status: Married    Spouse name: Not on file  . Number of children: 3  . Years of education: 10  . Highest education level: Not on file  Occupational History  . Occupation: retired    Comment: customer service [employer]  . Financial resource strain: Not on file  . Food insecurity:    Worry: Not on file    Inability: Not on file  . Transportation needs:    Medical: Not on file    Non-medical: Not on file  Tobacco Use  . Smoking status: Current Every Day Smoker    Packs/day: 1.50    Years: 45.00    Pack years: 67.50    Types: Cigarettes    Start date: 06/14/1966  . Smokeless tobacco: Never Used  . Tobacco  comment: 1-2 packs daily  Substance and Sexual Activity  . Alcohol use: No    Alcohol/week: 0.0 standard drinks  . Drug use: No  . Sexual activity: Yes    Birth control/protection: Post-menopausal  Lifestyle  . Physical activity:    Days per week:  Not on file    Minutes per session: Not on file  . Stress: Not on file  Relationships  . Social connections:    Talks on phone: Not on file    Gets together: Not on file    Attends religious service: Not on file    Active member of club or organization: Not on file    Attends meetings of clubs or organizations: Not on file    Relationship status: Not on file  Other Topics Concern  . Not on file  Social History Narrative   Quit high school and got married in 10th grade, age 83   First husband died of colon cancer at young age   Married again 2018   Lives in own home   Daughter lives with her   Drinks coffee seldom, too much "sweet tea"     Vitals:   10/20/18 1545  BP: 130/70  Pulse: 88  Resp: 16  Temp: 97.9 F (36.6 C)  SpO2: 95%   Body mass index is 30.77 kg/m.   Wt Readings from Last 3 Encounters:  10/20/18 168 lb 4 oz (76.3 kg)  08/04/18 168 lb 8 oz (76.4 kg)  06/19/18 158 lb 6 oz (71.8 kg)    Physical Exam  Nursing note and vitals reviewed. Constitutional: She is oriented to person, place, and time. She appears well-developed. No distress.  HENT:  Head: Normocephalic and atraumatic.  Mouth/Throat: Oropharynx is clear and moist and mucous membranes are normal.  Eyes: Pupils are equal, round, and reactive to light. Conjunctivae and EOM are normal.  Neck: No tracheal deviation present. No thyroid mass present.  Cardiovascular: Normal rate and regular rhythm.  No murmur heard. DP present bilateral.  Respiratory: Effort normal and breath sounds normal. No respiratory distress.  GI: Soft. She exhibits no mass. There is no hepatomegaly. There is no tenderness.  Musculoskeletal: She exhibits edema (2+ pitting LE  and pedal edema.).  Lymphadenopathy:    She has no cervical adenopathy.  Neurological: She is alert and oriented to person, place, and time. She has normal strength. No cranial nerve deficit.  Stable gait with no assistance. Biceps and patellar DTR's 1-2+,symmetric.  Skin: Skin is warm. No rash noted. No erythema.  Psychiatric: Her mood appears anxious.  Well groomed, good eye contact.     ASSESSMENT AND PLAN:  Ms. Zykera Abella was here today annual physical examination.   Orders Placed This Encounter  Procedures  . Mammogram Digital Screening  . DEXAScan  . Pneumococcal polysaccharide vaccine 23-valent greater than or equal to 2yo subcutaneous/IM  . Flu vaccine HIGH DOSE PF  . Hepatitis C antibody  . Basic metabolic panel  . Ambulatory referral to Pain Clinic    Lab Results  Component Value Date   CREATININE 0.70 10/20/2018   BUN 19 10/20/2018   NA 143 10/20/2018   K 3.7 10/20/2018   CL 103 10/20/2018   CO2 32 10/20/2018     Routine general medical examination at a health care facility We discussed the importance of regular physical activity and healthy diet for prevention of chronic illness and/or complications. Preventive guidelines reviewed. Vaccination updated. Aspirin for secondary prevention to continue. Ca++ and vit D supplementation recommended.Ca++ supplementation ideally through her diet,1000-1200 mg. Next CPE in a year.  Medicare annual wellness visit, subsequent We discussed the importance of staying active, physically and mentally, as well as the benefits of a healthy/balance diet. Low impact exercise that involve stretching and strengthing are ideal.  We discussed preventive screening for the next 5-10 years, summery of recommendations given in AVS:  Colon cancer screening, colonoscopy due in 06/2019. Continue eye exam and glaucoma screening q 6 months. Diabetes screening q 1-3 years. DEXA and mammogram will be arranged. Lung cancer screening  04/2017. Following with pulmonologist.  Fall prevention.  Advance directives and end of life discussed, she has a POA and living will.     Chronic anxiety No changes in Xanax or Cymbalta. Instructed about warning signs. F/U in 4 months.  Chronic pain disorder -     Ambulatory referral to Pain Clinic  Spinal stenosis of lumbar region, unspecified whether neurogenic claudication present This could be contributing/causing LE pain at night.  Not good candidate for surgery due to health problems. Continue Cymbalta. Referral to pain management placed.  Symptoms are not suggestive of RLS, she agrees with decreasing dose of Requip from 0.75 mg to 0.5 mg daily. F/U in 4 months.  Hypokalemia No changes in KCL 10 meq daily. We will follow labs done today and will give further recommendations accordingly.  -     Basic metabolic panel  Encounter for HCV screening test for high risk patient -     Hepatitis C antibody  Asymptomatic postmenopausal estrogen deficiency -     DEXAScan; Future  Breast cancer screening -     Mammogram Digital Screening; Future  Need for pneumococcal vaccination -     Pneumococcal polysaccharide vaccine 23-valent greater than or equal to 2yo subcutaneous/IM  Encounter for immunization -     Flu vaccine HIGH DOSE PF  Other orders -     rOPINIRole (REQUIP) 0.25 MG tablet; Take 2 tablets (0.5 mg total) by mouth at bedtime.                Return in 4 months (on 02/18/2019) for anxiety.          Ceirra Belli G. Martinique, MD  [facility]. [place] office.

## 2018-10-21 LAB — BASIC METABOLIC PANEL
BUN: 19 mg/dL (ref 6–23)
CALCIUM: 9.5 mg/dL (ref 8.4–10.5)
CHLORIDE: 103 meq/L (ref 96–112)
CO2: 32 meq/L (ref 19–32)
CREATININE: 0.7 mg/dL (ref 0.40–1.20)
GFR: 88.26 mL/min (ref >60.00)
Glucose, Bld: 107 mg/dL — ABNORMAL HIGH (ref 70–99)
Potassium: 3.7 mEq/L (ref 3.5–5.1)
Sodium: 143 mEq/L (ref 135–145)

## 2018-10-21 LAB — HEPATITIS C ANTIBODY
HEP C AB: NONREACTIVE
SIGNAL TO CUT-OFF: 0.01 (ref <1.00)

## 2018-10-22 ENCOUNTER — Telehealth: Payer: Self-pay | Admitting: Family Medicine

## 2018-10-22 ENCOUNTER — Other Ambulatory Visit: Payer: Self-pay | Admitting: *Deleted

## 2018-10-22 MED ORDER — NITROGLYCERIN 0.4 MG SL SUBL: 0.4000 mg | SUBLINGUAL_TABLET | SUBLINGUAL | 2 refills | Status: DC | PRN

## 2018-10-22 NOTE — Telephone Encounter (Signed)
Copied from [place] [phone]. Topic: Quick Communication - Rx Refill/Question >> Oct 22, 2018  8:57 AM Margot Ables wrote: Medication: pt spouse called stating after OV 11/19 with Dr. Martinique they thought RX for nitroglycerin tablets and shingles vaccine were going to be sent to the pharmacy for her. They called pharmacy and were advised nothing new was received.   Has the patient contacted their pharmacy? Yes - nothing received Preferred Pharmacy (with phone number or street name): [facility], [place] [phone] (Phone) [phone] (Fax)

## 2018-10-22 NOTE — Telephone Encounter (Signed)
Left detailed message that Rx for Nitrostat was sent to the pharmacy and that the Rx for the Shingles shot will need to approved and signed by Dr. Martinique. Informed that PCP is out of the office today and would return on tomorrow.

## 2018-10-24 ENCOUNTER — Encounter: Payer: Self-pay | Admitting: Family Medicine

## 2018-10-25 ENCOUNTER — Encounter: Payer: Self-pay | Admitting: Family Medicine

## 2018-10-26 DIAGNOSIS — L600 Ingrowing nail: Secondary | ICD-10-CM | POA: Diagnosis not present

## 2018-10-26 DIAGNOSIS — M79675 Pain in left toe(s): Secondary | ICD-10-CM | POA: Diagnosis not present

## 2018-10-26 DIAGNOSIS — B351 Tinea unguium: Secondary | ICD-10-CM | POA: Diagnosis not present

## 2018-10-26 DIAGNOSIS — M79674 Pain in right toe(s): Secondary | ICD-10-CM | POA: Diagnosis not present

## 2018-10-28 ENCOUNTER — Other Ambulatory Visit: Payer: Self-pay | Admitting: Family Medicine

## 2018-11-06 DIAGNOSIS — J449 Chronic obstructive pulmonary disease, unspecified: Secondary | ICD-10-CM | POA: Diagnosis not present

## 2018-11-10 ENCOUNTER — Other Ambulatory Visit: Payer: Self-pay | Admitting: Family Medicine

## 2018-11-12 ENCOUNTER — Ambulatory Visit (HOSPITAL_BASED_OUTPATIENT_CLINIC_OR_DEPARTMENT_OTHER): Payer: Medicare HMO | Admitting: Physical Medicine & Rehabilitation

## 2018-11-12 ENCOUNTER — Encounter: Payer: Self-pay | Admitting: Physical Medicine & Rehabilitation

## 2018-11-12 ENCOUNTER — Encounter: Payer: Medicare HMO | Attending: Physical Medicine & Rehabilitation

## 2018-11-12 VITALS — BP 113/64 | HR 85 | Ht 62.0 in | Wt 168.0 lb

## 2018-11-12 DIAGNOSIS — M48061 Spinal stenosis, lumbar region without neurogenic claudication: Secondary | ICD-10-CM | POA: Diagnosis not present

## 2018-11-12 DIAGNOSIS — M5442 Lumbago with sciatica, left side: Secondary | ICD-10-CM

## 2018-11-12 DIAGNOSIS — M5416 Radiculopathy, lumbar region: Secondary | ICD-10-CM | POA: Diagnosis not present

## 2018-11-12 DIAGNOSIS — M47816 Spondylosis without myelopathy or radiculopathy, lumbar region: Secondary | ICD-10-CM

## 2018-11-12 DIAGNOSIS — G8929 Other chronic pain: Secondary | ICD-10-CM | POA: Diagnosis not present

## 2018-11-12 DIAGNOSIS — M961 Postlaminectomy syndrome, not elsewhere classified: Secondary | ICD-10-CM | POA: Insufficient documentation

## 2018-11-12 NOTE — Progress Notes (Signed)
Subjective:  decompression 01/19/2018 L4-5 level for spinal stenosis. She is had some increased symptoms in her back particularly when she moved furniture chairs so first etc. She has pain in her back when she does a lot of turning and twisting such as vacuuming and I have talked with her several times about doing just a little bit of housecleaning activity each day and not a lot at once since is likely to flareup her symptoms. She had previous superior endplate fracture of L4. X-rays after surgery shows intact pars and previous laminotomy/laminectomy defect from decompression L4-5 which looks good without anterolisthesis and without disc space narrowing (Dr Lorin [facility] surgery note)   Patient ID: Tonya Bradley, female    DOB: 1951-08-10, 67 y.o.   MRN: [id number]  HPI  CC:  Bilateral leg soreness and low back pain 67 year old female with a prior medical history significant for coronary artery disease status post angioplasty, atrial flutter status post radiofrequency ablation, who has primary concerns of chronic low back pain.  She has been seen by physical medicine rehab Dr. Ernestina Patches who performed intra-articular L4-L5 facet injections under fluoroscopic guidance which were not very helpful for her pain.  She was referred to orthopedic surgeon Dr. Lorin Mercy who performed L4-5 decompression without fusion.  She had no postoperative complications but continued to complain of her low back pain.  She does have occasional pain that goes into her left lower extremity mainly at night.  She is quite comfortable when she is sitting however when she stands for prolonged period of time or if she walks she has increased pain in her back. She has not tried any physical therapy.  She does her own housework and according to her husband is quite active during the day.  She does have to take rest breaks.  She is on home O2 but is not wearing it right now.  She continues to smoke cigarettes 1.5 packs/day we discussed  smoking cessation  Hx diarrhea - "lymphocytic colitis" intermittent diarrhea has been on steroids in past otherwise stays constipated and uses LinZess Pain Inventory Average Pain 7 Pain Right Now 8 My pain is na  In the last 24 hours, has pain interfered with the following? General activity 6 Relation with others 6 Enjoyment of life 7 What TIME of day is your pain at its worst? all Sleep (in general) Poor  Pain is worse with: walking, bending, sitting, inactivity and standing Pain improves with: heat/ice, medication and TENS Relief from Meds: .  Mobility walk without assistance ability to climb steps?  yes do you drive?  yes  Function not employed: date last employed 43yrs  Neuro/Psych spasms depression  Prior Studies Any changes since last visit?  no CLINICAL DATA:  Low back pain and bilateral leg pain over the last 5 months.  EXAM: MRI LUMBAR SPINE WITHOUT CONTRAST  TECHNIQUE: Multiplanar, multisequence MR imaging of the lumbar spine was performed. No intravenous contrast was administered.  COMPARISON:  Radiography 05/08/2017  FINDINGS: Segmentation:  5 lumbar type vertebral bodies.  Alignment:  Normal  Vertebrae: Old superior endplate compression deformity at L4 which is completely healed. No other focal bone finding.  Conus medullaris: Extends to the L1 level and appears normal.  Paraspinal and other soft tissues: Negative  Disc levels:  No significant finding at L2-3 or above.  L3-4: Minimal bulging of the disc. Minimal facet hypertrophy. No compressive stenosis.  L4-5: Circumferential disc protrusion more prominent towards the right. Bilateral facet and ligamentous hypertrophy. Stenosis  of both lateral recesses right more than left. Neural compression could occur at this level, particularly on the right.  L5-S1: Annular fissures and annular bulging. Mild facet hypertrophy. No compressive stenosis.  IMPRESSION: The dominant  findings are at L4-5. There is shallow protrusion of the disc more prominent towards the right. The facets ligaments are hypertrophic. There is stenosis of both lateral recesses right more than left. Neural compression could occur on either side, more likely the right. Findings could also contribute to low back pain.  Old healed superior endplate compression deformity at L4. Minimal bulging of the disc and minimal facet arthritis.  L5-S1 annular bulge and mild facet arthritis. No compressive stenosis.   Electronically Signed   By: Nelson Chimes M.D.   On: 08/21/2017 14:21 Physicians involved in your care Any changes since last visit?  no   Family History  Problem Relation Age of Onset  . Breast cancer Sister 88  . Heart disease Sister   . Heart attack Father   . Heart disease Father   . Stroke Mother   . Heart disease Mother   . Hyperlipidemia Mother   . Heart attack Sister   . Heart disease Brother        ?heart failure  . Congestive Heart Failure Brother   . Hypertension Son   . Heart disease Maternal Grandfather   . Cancer Cousin        lymphoma  . Hypertension Son   . Stomach cancer Other   . Colon cancer Neg Hx    Social History   Socioeconomic History  . Marital status: Married    Spouse name: Not on file  . Number of children: 3  . Years of education: 10  . Highest education level: Not on file  Occupational History  . Occupation: retired    Comment: customer service [employer]  . Financial resource strain: Not on file  . Food insecurity:    Worry: Not on file    Inability: Not on file  . Transportation needs:    Medical: Not on file    Non-medical: Not on file  Tobacco Use  . Smoking status: Current Every Day Smoker    Packs/day: 1.50    Years: 45.00    Pack years: 67.50    Types: Cigarettes    Start date: 06/14/1966  . Smokeless tobacco: Never Used  . Tobacco comment: 1-2 packs daily  Substance and Sexual Activity  . Alcohol  use: No    Alcohol/week: 0.0 standard drinks  . Drug use: No  . Sexual activity: Yes    Birth control/protection: Post-menopausal  Lifestyle  . Physical activity:    Days per week: Not on file    Minutes per session: Not on file  . Stress: Not on file  Relationships  . Social connections:    Talks on phone: Not on file    Gets together: Not on file    Attends religious service: Not on file    Active member of club or organization: Not on file    Attends meetings of clubs or organizations: Not on file    Relationship status: Not on file  Other Topics Concern  . Not on file  Social History Narrative   Quit high school and got married in 10th grade, age 22   First husband died of colon cancer at young age   Married again 2018   Lives in own home   Daughter lives with her  Drinks coffee seldom, too much "sweet tea"   Past Surgical History:  Procedure Laterality Date  . A-FLUTTER ABLATION N/A 05/19/2017   Procedure: A-Flutter Ablation;  Surgeon: Evans Lance, MD;  Location: [facility];  Service: Cardiovascular;  Laterality: N/A;  . APPENDECTOMY    . BASAL CELL CARCINOMA EXCISION    . CARDIAC CATHETERIZATION    . CATARACT EXTRACTION Bilateral   . CHOLECYSTECTOMY    . COLONOSCOPY N/A 06/23/2014   Dr. Rourk:Rectal and colonic polyps-removed, Pancolonic diverticulosis. lymphocytic colitis  . CORONARY ANGIOPLASTY WITH STENT PLACEMENT    . ESOPHAGEAL DILATION N/A 03/13/2016   Procedure: ESOPHAGEAL DILATION;  Surgeon: Daneil Dolin, MD;  [facility]: [facility];  Service: Endoscopy;  Laterality: N/A;  . ESOPHAGOGASTRODUODENOSCOPY N/A 03/13/2016   Procedure: ESOPHAGOGASTRODUODENOSCOPY (EGD);  Surgeon: Daneil Dolin, MD;  Location: [facility];  Service: Endoscopy;  Laterality: N/A;  0930  . EYE SURGERY     cataract removed, bilateral  . LUMBAR LAMINECTOMY/DECOMPRESSION MICRODISCECTOMY N/A 01/19/2018   Procedure: L4-5 DECOMPRESSION;  Surgeon: Marybelle Killings, MD;  Location:  [facility];  Service: Orthopedics;  Laterality: N/A;   Past Medical History:  Diagnosis Date  . Anemia   . Anxiety   . Arthritis    "back"  . Atrial flutter ([place]) 03/13/2016  . CAD (coronary artery disease)   . Cancer ([place])    skin cancer  . Cataract   . Colon polyp   . COPD (chronic obstructive pulmonary disease) ([place])   . Depression   . Dysrhythmia   . Emphysema of lung ([place])   . GERD (gastroesophageal reflux disease)   . Hypercholesterolemia   . Osteoporosis   . Oxygen deficiency    BP 113/64   Pulse 85   Ht 5\' 2"  (1.575 m)   Wt 168 lb (76.2 kg)   SpO2 (!) 87%   BMI 30.73 kg/m   Opioid Risk Score:   Fall Risk Score:  `1  Depression screen PHQ 2/9  Depression screen [facility] 2/9 10/24/2018 03/12/2017 11/07/2016  Decreased Interest 0 0 0  Down, Depressed, Hopeless 0 3 0  PHQ - 2 Score 0 3 0  Altered sleeping - 0 -  Tired, decreased energy - 0 -  Change in appetite - 0 -  Feeling bad or failure about yourself  - 0 -  Trouble concentrating - 0 -  Moving slowly or fidgety/restless - 0 -  Suicidal thoughts - 0 -  PHQ-9 Score - 3 -  Difficult doing work/chores - Somewhat difficult -     Review of Systems  Constitutional: Negative.   HENT: Negative.   Eyes: Negative.   Respiratory: Negative.   Cardiovascular: Negative.   Gastrointestinal: Positive for constipation and diarrhea.  Endocrine: Negative.   Genitourinary: Negative.   Musculoskeletal: Positive for arthralgias, back pain, gait problem, joint swelling and myalgias.  Skin: Negative.   Allergic/Immunologic: Negative.   Hematological: Negative.   Psychiatric/Behavioral: Positive for dysphoric mood.  All other systems reviewed and are negative.      Objective:   Physical Exam Vitals signs and nursing note reviewed.  Constitutional:      Appearance: Normal appearance. She is obese.  HENT:     Head: Normocephalic and atraumatic.     Nose: Nose normal.     Mouth/Throat:     Mouth: Mucous membranes are  dry.  Eyes:     Extraocular Movements: Extraocular movements intact.     Pupils: Pupils are equal, round,  and reactive to light.  Neck:     Musculoskeletal: Normal range of motion. No muscular tenderness.  Cardiovascular:     Rate and Rhythm: Normal rate and regular rhythm.     Heart sounds: Normal heart sounds. No murmur.  Pulmonary:     Effort: Pulmonary effort is normal. No respiratory distress.     Breath sounds: Normal breath sounds. No stridor. No wheezing or rhonchi.  Abdominal:     General: Abdomen is flat. There is no distension.     Palpations: Abdomen is soft. There is no mass.  Skin:    General: Skin is warm and dry.  Neurological:     General: No focal deficit present.     Mental Status: She is alert and oriented to person, place, and time.     Deep Tendon Reflexes:     Reflex Scores:      Tricep reflexes are 2+ on the right side and 2+ on the left side.      Bicep reflexes are 2+ on the right side and 1+ on the left side.      Patellar reflexes are 3+ on the right side and 3+ on the left side.      Achilles reflexes are 1+ on the right side and 1+ on the left side. Psychiatric:        Mood and Affect: Mood normal.        Behavior: Behavior normal.   Lumbar pain with extension greater than with flexion.  Her range of motion with extension is 25% of normal flexion is 100% of normal lateral bending is 50% of normal Negative straight leg raising Ambulates without assistive device no evidence of toe drag or knee instability  Motor strength is 5/5 bilateral hip flexor knee extensor ankle dorsiflexion plantar flexor She has no tenderness to palpation in the upper lumbar area but does have tenderness palpation lower lumbar paraspinal area.      Assessment & Plan:  1.  Chronic low back pain-patient has lumbar postlaminectomy syndrome.  Her pain is mainly with extension as well as with walking.  I suspect she may have some facet mediated pain.  She did not have good relief  with L4-5 facet injections however she has not had medial branch blocks and additional levels can be targeted. We will set her up for L3-L4 medial branch and L5 dorsal ramus injections Recommend physical therapy, even though she is quite physically active she has poor hip and lumbar spine flexibility, she has core strength deficits as well We also discussed medication management she is not a good candidate for nonsteroidal anti-inflammatories and Tylenol has not been very helpful for her.  She denies any history of prescription drug abuse, no history of alcohol use. We will check a urine drug screen if negative we may be able to prescribe tramadol 50 mg twice daily

## 2018-11-17 ENCOUNTER — Other Ambulatory Visit: Payer: Self-pay | Admitting: Emergency Medicine

## 2018-11-17 ENCOUNTER — Other Ambulatory Visit: Payer: Self-pay | Admitting: *Deleted

## 2018-11-17 ENCOUNTER — Telehealth: Payer: Self-pay | Admitting: *Deleted

## 2018-11-17 DIAGNOSIS — I83813 Varicose veins of bilateral lower extremities with pain: Secondary | ICD-10-CM

## 2018-11-17 LAB — TOXASSURE SELECT,+ANTIDEPR,UR

## 2018-11-17 MED ORDER — FUROSEMIDE 20 MG PO TABS: ORAL_TABLET | ORAL | 3 refills | Status: DC

## 2018-11-17 NOTE — Telephone Encounter (Signed)
Urine drug screen is consistent. No controlled medication present with the exception of prescribed alprazolam.

## 2018-11-18 ENCOUNTER — Telehealth: Payer: Self-pay

## 2018-11-18 NOTE — Telephone Encounter (Signed)
Patient called and requested pain medication.

## 2018-11-19 MED ORDER — TRAMADOL HCL 50 MG PO TABS: 50.0000 mg | ORAL_TABLET | Freq: Four times a day (QID) | ORAL | 2 refills | Status: DC | PRN

## 2018-11-23 ENCOUNTER — Other Ambulatory Visit: Payer: Self-pay | Admitting: Family Medicine

## 2018-11-27 DIAGNOSIS — M79674 Pain in right toe(s): Secondary | ICD-10-CM | POA: Diagnosis not present

## 2018-11-27 DIAGNOSIS — B351 Tinea unguium: Secondary | ICD-10-CM | POA: Diagnosis not present

## 2018-11-27 DIAGNOSIS — L600 Ingrowing nail: Secondary | ICD-10-CM | POA: Diagnosis not present

## 2018-11-27 DIAGNOSIS — M79675 Pain in left toe(s): Secondary | ICD-10-CM | POA: Diagnosis not present

## 2018-11-28 ENCOUNTER — Other Ambulatory Visit: Payer: Self-pay | Admitting: Family Medicine

## 2018-11-30 ENCOUNTER — Other Ambulatory Visit: Payer: Self-pay | Admitting: Family Medicine

## 2018-12-07 ENCOUNTER — Ambulatory Visit: Payer: Medicare HMO | Attending: Physical Medicine & Rehabilitation | Admitting: Physical Therapy

## 2018-12-07 ENCOUNTER — Other Ambulatory Visit: Payer: Self-pay

## 2018-12-07 ENCOUNTER — Encounter: Payer: Self-pay | Admitting: Physical Therapy

## 2018-12-07 DIAGNOSIS — M5442 Lumbago with sciatica, left side: Secondary | ICD-10-CM | POA: Diagnosis not present

## 2018-12-07 DIAGNOSIS — M6281 Muscle weakness (generalized): Secondary | ICD-10-CM | POA: Insufficient documentation

## 2018-12-07 DIAGNOSIS — M5441 Lumbago with sciatica, right side: Secondary | ICD-10-CM | POA: Insufficient documentation

## 2018-12-07 DIAGNOSIS — G8929 Other chronic pain: Secondary | ICD-10-CM | POA: Diagnosis not present

## 2018-12-07 DIAGNOSIS — J449 Chronic obstructive pulmonary disease, unspecified: Secondary | ICD-10-CM | POA: Diagnosis not present

## 2018-12-07 NOTE — Patient Instructions (Signed)
Access Code: [email or web address]  URL: [email or web address]  Date: 12/07/2018  Prepared by: Venetia Night Keria Widrig   Exercises  Sidelying Transversus Abdominis Bracing - 10 reps - 2 sets - 10 hold - 1x daily - 7x weekly

## 2018-12-07 NOTE — Therapy (Signed)
[facility] [address], [place], [place], [address] Phone: [phone]   Fax:  [phone]  Physical Therapy Evaluation  Patient Details  Name: Tonya Bradley MRN: [id number] Date of Birth: January 28, 1951 Referring Provider (PT): Letta Pate Luanna Salk, MD   Encounter Date: 12/07/2018  PT End of Session - 12/07/18 1731    Visit Number  1    Date for PT Re-Evaluation  02/01/19    Authorization Type  [employer]    Authorization Time Period  12/07/18-02/01/19    PT Start Time  0245    PT Stop Time  0328    PT Time Calculation (min)  43 min    Activity Tolerance  Patient limited by pain;Patient tolerated treatment well    Behavior During Therapy  [facility] for tasks assessed/performed       Past Medical History:  Diagnosis Date  . Anemia   . Anxiety   . Arthritis    "back"  . Atrial flutter ([place]) 03/13/2016  . CAD (coronary artery disease)   . Cancer ([place])    skin cancer  . Cataract   . Colon polyp   . COPD (chronic obstructive pulmonary disease) ([place])   . Depression   . Dysrhythmia   . Emphysema of lung ([place])   . GERD (gastroesophageal reflux disease)   . Hypercholesterolemia   . Osteoporosis   . Oxygen deficiency     Past Surgical History:  Procedure Laterality Date  . A-FLUTTER ABLATION N/A 05/19/2017   Procedure: A-Flutter Ablation;  Surgeon: Evans Lance, MD;  Location: [place] [facility];  Service: Cardiovascular;  Laterality: N/A;  . APPENDECTOMY    . BASAL CELL CARCINOMA EXCISION    . CARDIAC CATHETERIZATION    . CATARACT EXTRACTION Bilateral   . CHOLECYSTECTOMY    . COLONOSCOPY N/A 06/23/2014   Dr. Rourk:Rectal and colonic polyps-removed, Pancolonic diverticulosis. lymphocytic colitis  . CORONARY ANGIOPLASTY WITH STENT PLACEMENT    . ESOPHAGEAL DILATION N/A 03/13/2016   Procedure: ESOPHAGEAL DILATION;  Surgeon: Daneil Dolin, MD;  [facility];  Service: Endoscopy;  Laterality: N/A;  .  ESOPHAGOGASTRODUODENOSCOPY N/A 03/13/2016   Procedure: ESOPHAGOGASTRODUODENOSCOPY (EGD);  Surgeon: Daneil Dolin, MD;  [facility];  Service: Endoscopy;  Laterality: N/A;  0930  . EYE SURGERY     cataract removed, bilateral  . LUMBAR LAMINECTOMY/DECOMPRESSION MICRODISCECTOMY N/A 01/19/2018   Procedure: L4-5 DECOMPRESSION;  Surgeon: Marybelle Killings, MD;  Location: [place];  Service: Orthopedics;  Laterality: N/A;    There were no vitals filed for this visit.   Subjective Assessment - 12/07/18 1452    Subjective  Pt had lumbar decompression surgery in 01/2018.  Pt states surgery helped somewhat.  Pt has bil leg pain and low back pain.  Pt is not a good candidate for surgery due to respiratory health.   She is scheduled to get lumbar injections this Friday.    Pertinent History  Pt uses oxygen (supposed to be 24/7 although she doesn't always use it)    Limitations  Standing;Sitting;Walking;House hold activities;Lifting    How long can you sit comfortably?  1 hour    How long can you stand comfortably?  1 hour    How long can you walk comfortably?  several blocks 4x/day (20 min walk)    Patient Stated Goals  put on panty hose, shoes, boots    Currently in Pain?  Yes    Pain Score  7  Pain Location  Back    Pain Orientation  Right;Left;Lower    Pain Descriptors / Indicators  Sharp;Aching    Pain Type  Chronic pain    Pain Radiating Towards  buttocks and bil legs anterior and posterior    Pain Onset  More than a month ago    Pain Frequency  Constant    Aggravating Factors   putting hose and shoes on, vacuuming, walking the dog    Pain Relieving Factors  can't think of anything that gives relief    Effect of Pain on Daily Activities  sleep, rolling in bed, getting dressed         [facility] PT Assessment - 12/07/18 0001      Assessment   Medical Diagnosis  M54.42,G89.29 (ICD-10-CM) - Chronic left-sided low back pain with left-sided sciatica    Referring Provider (PT)  Letta Pate,  Luanna Salk, MD    Onset Date/Surgical Date  --   prior to 01/2018   Next MD Visit  this friday    Prior Therapy  no      Precautions   Precautions  None      Balance Screen   Has the patient fallen in the past 6 months  No      [place] residence    Living Arrangements  Spouse/significant other    Type of [place] Access  Level entry    [place]  One level    Additional Comments  uses oxygen      Prior Function   Level of [place]  Retired    Leisure  walk dog, play computer games, read, paint on phone, Engineer, manufacturing systems   Overall Cognitive Status  Within Functional Limits for tasks assessed      Observation/Other Assessments   Focus on Therapeutic Outcomes (FOTO)   46   40 goal     Posture/Postural Control   Posture/Postural Control  Postural limitations    Postural Limitations  Increased lumbar lordosis    Posture Comments  weakness in core control      ROM / Strength   AROM / PROM / Strength  AROM;Strength      AROM   Overall AROM   Deficits    Overall AROM Comments  lumbar ROM grossly limited by 50% in all cardinal planes, with pain on flexion and extension.  Bil hips grossly limited by 50% in all cardinal planes.      Strength   Overall Strength  Deficits    Overall Strength Comments  Rt hip grossly 5/5    Strength Assessment Site  Hip;Knee    Right/Left Hip  Left    Left Hip Flexion  3+/5    Left Hip Extension  4+/5    Left Hip External Rotation  4-/5    Left Hip Internal Rotation  4-/5    Left Hip ABduction  3+/5    Left Hip ADduction  4/5    Right/Left Knee  Right;Left    Right Knee Flexion  4-/5    Right Knee Extension  4+/5    Left Knee Flexion  3+/5    Left Knee Extension  4+/5      Flexibility   Soft Tissue Assessment /Muscle Length  yes    Hamstrings  limited by 30 deg bil      Palpation   Spinal mobility  poor lumbar reversal on trunk forward bending       Special Tests    Special Tests  Lumbar    Lumbar Tests  Straight Leg Raise      Straight Leg Raise   Findings  Positive    Comment  bilaterally at 30 degrees      Bed Mobility   Bed Mobility  --   poor understanding of safe bed mobility     Balance   Balance Assessed  Yes      Static Standing Balance   Static Standing - Balance Support  Bilateral upper extremity supported    Static Standing Balance -  Activities   Single Leg Stance - Right Leg;Single Leg Stance - Left Leg    Static Standing - Comment/# of Minutes  <5 sec bil                Objective measurements completed on examination: See above findings.              PT Education - 12/07/18 1530    Education Details  Access Code: [email or web address]    Person(s) Educated  Patient    Methods  Explanation;Demonstration;Verbal cues;Handout    Comprehension  Verbalized understanding;Returned demonstration       PT Short Term Goals - 12/07/18 1926      PT SHORT TERM GOAL #1   Title  Pt will be ind in initial HEP    Time  4    Period  Weeks    Status  New    Target Date  01/04/19      PT SHORT TERM GOAL #2   Title  Pt will demonstrate proper bed mobility and sit to stand transfers with use of core and proper body mechanics with use of UEs as needed.    Time  4    Period  Weeks    Status  New    Target Date  01/04/19      PT SHORT TERM GOAL #3   Title  Pt will be able to perform bil SLS with UE use prn for 15 seconds with eyes open.    Time  4    Period  Weeks    Status  New    Target Date  01/04/19      PT SHORT TERM GOAL #4   Title  Pt will report greater ease of donning shoes, socks, and panty hose by at least 20%.    Time  4    Period  Weeks    Status  New    Target Date  01/04/19        PT Long Term Goals - 12/07/18 1932      PT LONG TERM GOAL #1   Title  Pt will be ind in advanced HEP.    Time  8    Period  Weeks    Status  New    Target Date  02/01/19      PT LONG TERM GOAL  #2   Title  Pt will reduce FOTO score to 40% or less demonstrating less limitation with daily tasks.    Time  8    Period  Weeks    Status  New    Target Date  02/01/19      PT LONG TERM GOAL #3   Title  Pt will achieve strength rating of at least 4/5 for all LE muscle groups so that she may perform more physical activities throughout the  day.    Time  8    Period  Weeks    Status  New    Target Date  02/01/19      PT LONG TERM GOAL #4   Title  Pt will be able to perform bil SLS with UE support as needed for at least 20 sec demonstrating less fall risk and improved safety.    Time  8    Period  Weeks    Status  New    Target Date  02/01/19      PT LONG TERM GOAL #5   Title  Pt will report improved ease of donning shoes, socks, and panty hose by at least 30%    Time  8    Period  Weeks    Status  New    Target Date  02/01/19             Plan - 12/07/18 1920    Clinical Impression Statement  Pt presents to PT with report of chronic LBP with bilateral sciatica.  Lumbar decompression surgery was performed 01/2018 with partial relief.  Pt has significant limitations in trunk and hip ROM, weakness in Lt hip, bilateral hamstrings and bil gastrocs, poor balance in SLS bil (balance <5 sec with bil UE support), + SLR bil and poor core awareness.  She will benefit from skilled PT to address these deficits and improve her ability to perform ADLs with greater safety and less pain.    History and Personal Factors relevant to plan of care:  uses oxygen, poor pulmonary health, lumbar decompression surgery 01/2018 for spinal stenosis    Clinical Presentation  Stable    Clinical Decision Making  Low    Rehab Potential  Good    Clinical Impairments Affecting Rehab Potential  poor pulmonary health/endurance, uses oxygen    PT Frequency  2x / week    PT Duration  8 weeks    PT Treatment/Interventions  ADLs/Self Care Home Management;Electrical Stimulation;Moist Heat;Traction;Gait  training;Functional mobility training;Therapeutic activities;Therapeutic exercise;Balance training;Neuromuscular re-education;Patient/family education;Manual techniques;Passive range of motion;Dry needling;Taping;Spinal Manipulations;Joint Manipulations    PT Next Visit Plan  f/u on HEP (core in sidelying), f/u on injections, gentle mobility for trunk and hips as tolerated, LE strength, balance training, core, modalities for pain    PT Home Exercise Plan  Access Code: [email or web address]    Consulted and Agree with Plan of Care  Patient       Patient will benefit from skilled therapeutic intervention in order to improve the following deficits and impairments:  Abnormal gait, Decreased endurance, Hypomobility, Cardiopulmonary status limiting activity, Impaired tone, Decreased activity tolerance, Decreased strength, Pain, Decreased balance, Decreased mobility, Difficulty walking, Postural dysfunction, Improper body mechanics, Decreased range of motion  Visit Diagnosis: Chronic bilateral low back pain with bilateral sciatica - Plan: PT plan of care cert/re-cert  Muscle weakness (generalized) - Plan: PT plan of care cert/re-cert     Problem List Patient Active Problem List   Diagnosis Date Noted  . Dysphonia 06/19/2018  . Bilateral lower extremity edema 02/16/2018  . Lumbar stenosis 01/19/2018  . GERD (gastroesophageal reflux disease) 11/18/2017  . 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 COPD (chronic obstructive pulmonary disease) ([place]) 09/09/2017  . Varicose veins of bilateral lower extremities with other complications 50/08/3817  . Chronic pain disorder 05/26/2017  . Back pain 05/26/2017  . Insomnia 05/26/2017  . Osteopenia 11/26/2016  . Other emphysema ([place]) 11/07/2016  . Chronic anxiety 11/07/2016  .  Chronic hypoxemic respiratory failure ([place]) 11/07/2016  . Constipation 06/12/2016  . Right carotid bruit 04/03/2016  . CAD (coronary artery disease)  04/03/2016  . Tobacco abuse 04/03/2016  . Atrial flutter ([place]) 03/13/2016  . Hiatal hernia   . Reflux esophagitis   . Schatzki's ring   . Dysphagia 03/01/2016  . Solitary pulmonary nodule 03/01/2016  . 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 Microscopic colitis 08/15/2014  . Loose stools 06/08/2014  . BCC (basal cell carcinoma), face 09/13/2012    Baruch Merl, PT 12/07/18 7:41 PM    [facility] [address] [place], [place], [address] Phone: [phone]   Fax:  [phone]  Name: Tonya Bradley MRN: [id number] Date of Birth: 1951-06-29

## 2018-12-08 ENCOUNTER — Ambulatory Visit: Payer: Medicare HMO | Admitting: Emergency Medicine

## 2018-12-09 ENCOUNTER — Ambulatory Visit: Payer: Medicare HMO | Admitting: Physical Therapy

## 2018-12-09 DIAGNOSIS — M5441 Lumbago with sciatica, right side: Secondary | ICD-10-CM | POA: Diagnosis not present

## 2018-12-09 DIAGNOSIS — M6281 Muscle weakness (generalized): Secondary | ICD-10-CM | POA: Diagnosis not present

## 2018-12-09 DIAGNOSIS — M5442 Lumbago with sciatica, left side: Secondary | ICD-10-CM | POA: Diagnosis not present

## 2018-12-09 DIAGNOSIS — G8929 Other chronic pain: Secondary | ICD-10-CM

## 2018-12-09 NOTE — Patient Instructions (Signed)
Posture - Standing   Good posture is important. Avoid slouching and forward head thrust. Maintain curve in low back and align ears over shoulders, hips over ankles.  Pull your belly button in toward your back bone. Posture Tips DO: - stand tall and erect - keep chin tucked in - keep head and shoulders in alignment - check posture regularly in mirror or large window - pull head back against headrest in car seat;  Change your position often.  Sit with lumbar support. DON'T: - slouch or slump while watching TV or reading - sit, stand or lie in one position  for too long;  Sitting is especially hard on the spine so if you sit at a desk/use the computer, then stand up often! Copyright  VHI. All rights reserved.  Posture - Sitting  Sit upright, head facing forward. Try using a roll to support lower back. Keep shoulders relaxed, and avoid rounded back. Keep hips level with knees. Avoid crossing legs for long periods. Copyright  VHI. All rights reserved.  Chronic neck strain can develop because of poor posture and faulty work habits  Postural strain related to slumped sitting and forward head posture is a leading cause of headaches, neck and upper back pain  General strengthening and flexibility exercises are helpful in the treatment of neck pain.  Most importantly, you should learn to correct the posture that may be contributing to chronic pain.   Change positions frequently  Change your work or home environment to improve posture and mechanics.    

## 2018-12-09 NOTE — Therapy (Signed)
[facility] [address], [place], [place], [address] Phone: [phone]   Fax:  [phone]  Physical Therapy Treatment  Patient Details  Name: Tonya Bradley MRN: [id number] Date of Birth: Aug 28, 1951 Referring Provider (PT): Letta Pate Luanna Salk, MD   Encounter Date: 12/09/2018  PT End of Session - 12/09/18 1441    Visit Number  2    Date for PT Re-Evaluation  02/01/19    PT Start Time  1400    PT Stop Time  1440    PT Time Calculation (min)  40 min    Activity Tolerance  Patient limited by pain;Patient tolerated treatment well    Behavior During Therapy  [facility] for tasks assessed/performed       Past Medical History:  Diagnosis Date  . Anemia   . Anxiety   . Arthritis    "back"  . Atrial flutter ([place]) 03/13/2016  . CAD (coronary artery disease)   . Cancer ([place])    skin cancer  . Cataract   . Colon polyp   . COPD (chronic obstructive pulmonary disease) ([place])   . Depression   . Dysrhythmia   . Emphysema of lung ([place])   . GERD (gastroesophageal reflux disease)   . Hypercholesterolemia   . Osteoporosis   . Oxygen deficiency     Past Surgical History:  Procedure Laterality Date  . A-FLUTTER ABLATION N/A 05/19/2017   Procedure: A-Flutter Ablation;  Surgeon: Evans Lance, MD;  Location: [place] [facility];  Service: Cardiovascular;  Laterality: N/A;  . APPENDECTOMY    . BASAL CELL CARCINOMA EXCISION    . CARDIAC CATHETERIZATION    . CATARACT EXTRACTION Bilateral   . CHOLECYSTECTOMY    . COLONOSCOPY N/A 06/23/2014   Dr. Rourk:Rectal and colonic polyps-removed, Pancolonic diverticulosis. lymphocytic colitis  . CORONARY ANGIOPLASTY WITH STENT PLACEMENT    . ESOPHAGEAL DILATION N/A 03/13/2016   Procedure: ESOPHAGEAL DILATION;  Surgeon: Daneil Dolin, MD;  [facility];  Service: Endoscopy;  Laterality: N/A;  . ESOPHAGOGASTRODUODENOSCOPY N/A 03/13/2016   Procedure: ESOPHAGOGASTRODUODENOSCOPY (EGD);   Surgeon: Daneil Dolin, MD;  [facility];  Service: Endoscopy;  Laterality: N/A;  0930  . EYE SURGERY     cataract removed, bilateral  . LUMBAR LAMINECTOMY/DECOMPRESSION MICRODISCECTOMY N/A 01/19/2018   Procedure: L4-5 DECOMPRESSION;  Surgeon: Marybelle Killings, MD;  Location: [place];  Service: Orthopedics;  Laterality: N/A;    There were no vitals filed for this visit.  Subjective Assessment - 12/09/18 1403    Subjective  Pt states she was sore after first visit.  She has done the exercises at home    Currently in Pain?  Yes    Pain Score  4     Pain Location  Back    Pain Orientation  Right;Left;Lower    Pain Descriptors / Indicators  Aching;Sharp    Pain Type  Chronic pain    Pain Onset  More than a month ago    Multiple Pain Sites  No                       [facility] Adult PT Treatment/Exercise - 12/09/18 0001      Self-Care   Self-Care  Posture    Posture  sitting posture with back supported      Neuro Re-ed    Neuro Re-ed Details   educated in TrA contraction and done with all exercises  Exercises   Exercises  Lumbar      Lumbar Exercises: Stretches   Passive Hamstring Stretch  Right;Left;1 rep;30 seconds   with DF/PF of foot for nerve glide   Single Knee to Chest Stretch  Right;Left;3 reps;10 seconds    Lower Trunk Rotation  5 reps;10 seconds      Lumbar Exercises: Aerobic   Stationary Bike  L1 x 2 min   PT present to monitor; pain in LE     Lumbar Exercises: Seated   Long Arc Quad on Chair  Strengthening;Right;Left;20 reps;Weights    LAQ on Chair Weights (lbs)  --   1.5   Hip Flexion on Ball  Strengthening;Right;Left;20 reps;Limitations    Hip Flexion on Ball Limitations  1.5 lb on chair      Lumbar Exercises: Supine   Ab Set  5 reps    Pelvic Tilt  5 reps    Heel Slides  15 reps    Bent Knee Raise  20 reps;2 seconds    Other Supine Lumbar Exercises  bent knee fall out             PT Education - 12/09/18 1441    Education  Details   Access Code: [email or web address] and [email or web address]    Person(s) Educated  Patient    Methods  Explanation;Demonstration;Handout;Verbal cues    Comprehension  Verbalized understanding;Returned demonstration       PT Short Term Goals - 12/09/18 1444      PT SHORT TERM GOAL #1   Title  Pt will be ind in initial HEP    Status  Achieved        PT Long Term Goals - 12/07/18 1932      PT LONG TERM GOAL #1   Title  Pt will be ind in advanced HEP.    Time  8    Period  Weeks    Status  New    Target Date  02/01/19      PT LONG TERM GOAL #2   Title  Pt will reduce FOTO score to 40% or less demonstrating less limitation with daily tasks.    Time  8    Period  Weeks    Status  New    Target Date  02/01/19      PT LONG TERM GOAL #3   Title  Pt will achieve strength rating of at least 4/5 for all LE muscle groups so that she may perform more physical activities throughout the day.    Time  8    Period  Weeks    Status  New    Target Date  02/01/19      PT LONG TERM GOAL #4   Title  Pt will be able to perform bil SLS with UE support as needed for at least 20 sec demonstrating less fall risk and improved safety.    Time  8    Period  Weeks    Status  New    Target Date  02/01/19      PT LONG TERM GOAL #5   Title  Pt will report improved ease of donning shoes, socks, and panty hose by at least 30%    Time  8    Period  Weeks    Status  New    Target Date  02/01/19            Plan - 12/09/18 1441    Clinical Impression Statement  Pt was able to perform  the initial HEP and reports it is something she has been doing regularly already.  Pt needed a lot of cues to not hold her breathe during exercises.  pt needed cues for sitting posture and had improved and reduced symptoms when given lumbar support and foot support.  Pt will benefit from skilled PT to continue with POC.    PT Treatment/Interventions  ADLs/Self Care Home Management;Electrical Stimulation;Moist Heat;Traction;Gait  training;Functional mobility training;Therapeutic activities;Therapeutic exercise;Balance training;Neuromuscular re-education;Patient/family education;Manual techniques;Passive range of motion;Dry needling;Taping;Spinal Manipulations;Joint Manipulations    PT Next Visit Plan  f/u on HEP, f/u on injections, gentle mobility for trunk and hips as tolerated, LE strength, balance training, core, modalities for pain    PT Home Exercise Plan  Access Code: [email or web address]    Consulted and Agree with Plan of Care  Patient       Patient will benefit from skilled therapeutic intervention in order to improve the following deficits and impairments:  Abnormal gait, Decreased endurance, Hypomobility, Cardiopulmonary status limiting activity, Impaired tone, Decreased activity tolerance, Decreased strength, Pain, Decreased balance, Decreased mobility, Difficulty walking, Postural dysfunction, Improper body mechanics, Decreased range of motion  Visit Diagnosis: Chronic bilateral low back pain with bilateral sciatica  Muscle weakness (generalized)     Problem List Patient Active Problem List   Diagnosis Date Noted  . Dysphonia 06/19/2018  . Bilateral lower extremity edema 02/16/2018  . Lumbar stenosis 01/19/2018  . GERD (gastroesophageal reflux disease) 11/18/2017  . 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 COPD (chronic obstructive pulmonary disease) ([place]) 09/09/2017  . Varicose veins of bilateral lower extremities with other complications 07/62/2633  . Chronic pain disorder 05/26/2017  . Back pain 05/26/2017  . Insomnia 05/26/2017  . Osteopenia 11/26/2016  . Other emphysema ([place]) 11/07/2016  . Chronic anxiety 11/07/2016  . Chronic hypoxemic respiratory failure ([place]) 11/07/2016  . Constipation 06/12/2016  . Right carotid bruit 04/03/2016  . CAD (coronary artery disease) 04/03/2016  . Tobacco abuse 04/03/2016  . Atrial flutter ([place]) 03/13/2016  . Hiatal hernia   .  Reflux esophagitis   . Schatzki's ring   . Dysphagia 03/01/2016  . Solitary pulmonary nodule 03/01/2016  . 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 Microscopic colitis 08/15/2014  . Loose stools 06/08/2014  . BCC (basal cell carcinoma), face 09/13/2012    Zannie Cove, PT 12/09/2018, 2:48 PM  [facility] [address], [place], [place], [address] Phone: [phone]   Fax:  [phone]  Name: Tonya Bradley MRN: [id number] Date of Birth: October 10, 1951

## 2018-12-10 ENCOUNTER — Ambulatory Visit (INDEPENDENT_AMBULATORY_CARE_PROVIDER_SITE_OTHER): Payer: Medicare HMO | Admitting: Nurse Practitioner

## 2018-12-10 ENCOUNTER — Encounter: Payer: Self-pay | Admitting: Nurse Practitioner

## 2018-12-10 ENCOUNTER — Ambulatory Visit: Payer: Medicare HMO | Admitting: Emergency Medicine

## 2018-12-10 VITALS — BP 116/70 | HR 83 | Ht 62.0 in | Wt 169.2 lb

## 2018-12-10 DIAGNOSIS — Z720 Tobacco use: Secondary | ICD-10-CM

## 2018-12-10 DIAGNOSIS — J9611 Chronic respiratory failure with hypoxia: Secondary | ICD-10-CM | POA: Diagnosis not present

## 2018-12-10 DIAGNOSIS — J439 Emphysema, unspecified: Secondary | ICD-10-CM | POA: Diagnosis not present

## 2018-12-10 NOTE — Progress Notes (Signed)
@Patient  ID: Tonya Bradley, female    DOB: 1951-04-15, 68 y.o.   MRN: [id number]  Chief Complaint  Patient presents with  . Follow-up    6 Month    Referring provider: [place], [name], MD  HPI 68 year old active smoker (60 pack years) with chronic hypoxemic respiratory failure and COPD followed by Dr. Lamonte Sakai.   Tests: CXR 01/14/18 -chronic lung changes.  No acute abnormalities.  CXR 08/28/17 - No active cardiopulmonary disease.  PFT:  PFT Results Latest Ref Rng & Units 02/03/2018  FVC-Pre L 1.93  FVC-Predicted Pre % 68  FVC-Post L 1.85  FVC-Predicted Post % 65  Pre FEV1/FVC % % 82  Post FEV1/FCV % % 82  FEV1-Pre L 1.59  FEV1-Predicted Pre % 74  FEV1-Post L 1.51  DLCO UNC% % 65  DLCO COR %Predicted % 98  TLC L 4.84  TLC % Predicted % 101  RV % Predicted % 141    OV 12/11/18 - FOLLOW UP Presents for routine follow-up.  She states that she has been doing well.  This is been a stable interval for her.  Compliant with Trelegy.  She states that Trelegy has been working well for her.  She is on continuous oxygen at 2 L during the day and 3 L at night.  She is still smoking and does not plan to quit.  She denies any recent fever.  She denies any shortness of breath, chest pain, or edema.  Her vital signs are stable in office today.     No Known Allergies  Immunization History  Administered Date(s) Administered  . Influenza Whole 08/02/2016  . Influenza, High Dose Seasonal PF 09/10/2017, 10/20/2018  . Influenza, Seasonal, Injecte, Preservative Fre 11/15/2015  . Influenza,inj,Quad PF,6+ Mos 11/15/2015  . Pneumococcal Conjugate-13 03/12/2017  . Pneumococcal Polysaccharide-23 06/16/2012, 10/20/2018    Past Medical History:  Diagnosis Date  . Anemia   . Anxiety   . Arthritis    "back"  . Atrial flutter ([place]) 03/13/2016  . CAD (coronary artery disease)   . Cancer ([place])    skin cancer  . Cataract   . Colon polyp   . COPD (chronic obstructive pulmonary disease) ([place])   .  Depression   . Dysrhythmia   . Emphysema of lung ([place])   . GERD (gastroesophageal reflux disease)   . Hypercholesterolemia   . Osteoporosis   . Oxygen deficiency     Tobacco History: Social History   Tobacco Use  Smoking Status Current Every Day Smoker  . Packs/day: 1.50  . Years: 45.00  . Pack years: 67.50  . Types: Cigarettes  . Start date: 06/14/1966  Smokeless Tobacco Never Used  Tobacco Comment   1-2 packs daily   Ready to quit: No Counseling given: Yes Comment: 1-2 packs daily   Outpatient Encounter Medications as of 12/10/2018  Medication Sig  . ALPRAZolam (XANAX) 0.5 MG tablet TAKE 1 TABLET BY MOUTH TWICE DAILY AS NEEDED FOR ANXIETY. NO MORE THAN 40 TABLETS PER MONTH  . amiodarone (PACERONE) 200 MG tablet Take 1/2 tablet (100 mg) by mouth once daily (Patient taking differently: Take 100 mg by mouth daily. Take 1/2 tablet (100 mg) by mouth once daily)  . aspirin EC 81 MG tablet Take 81 mg by mouth daily.  Marland Kitchen atorvastatin (LIPITOR) 80 MG tablet TAKE 1 TABLET(80 MG) BY MOUTH DAILY  . Cholecalciferol (VITAMIN D) 2000 units CAPS Take 2,000 Units by mouth daily.  . cycloSPORINE (RESTASIS) 0.05 % ophthalmic emulsion Place  1 drop into both eyes 2 (two) times daily.  Marland Kitchen DEXILANT 30 MG capsule TAKE 1 CAPSULE(30 MG) BY MOUTH DAILY  . doxepin (SINEQUAN) 10 MG capsule TAKE 2 CAPSULES(20 MG) BY MOUTH AT BEDTIME  . DULoxetine (CYMBALTA) 60 MG capsule Take 1 capsule (60 mg total) by mouth daily.  . furosemide (LASIX) 20 MG tablet 1 tab 3 times per day.  . ibuprofen (ADVIL,MOTRIN) 200 MG tablet Take 400-600 mg by mouth every 8 (eight) hours as needed for headache or mild pain (2-3 tablets depending on pain).  Marland Kitchen LINZESS 290 MCG CAPS capsule TAKE 1 CAPSULE(290 MCG) BY MOUTH DAILY BEFORE BREAKFAST  . Multiple Vitamin (MULTIVITAMIN) tablet Take 1 tablet by mouth daily.  . nitroGLYCERIN (NITROSTAT) 0.4 MG SL tablet Place 1 tablet (0.4 mg total) under the tongue every 5 (five) minutes as  needed for chest pain. Reported on 06/12/2016  . potassium chloride (K-DUR) 10 MEQ tablet TAKE 1 TABLET(10 MEQ) BY MOUTH DAILY  . potassium chloride (K-DUR) 10 MEQ tablet TAKE 1 TABLET(10 MEQ) BY MOUTH DAILY  . rOPINIRole (REQUIP) 0.25 MG tablet Take 2 tablets (0.5 mg total) by mouth at bedtime.  . traMADol (ULTRAM) 50 MG tablet Take 1 tablet (50 mg total) by mouth every 6 (six) hours as needed.  . TRELEGY ELLIPTA 100-62.5-25 MCG/INH AEPB INHALE 1 PUFF INTO THE LUNGS DAILY  . VENTOLIN HFA 108 (90 Base) MCG/ACT inhaler INHALE 2 PUFFS INTO THE LUNGS EVERY 6 HOURS AS NEEDED FOR WHEEZING OR SHORTNESS OF BREATH  . [DISCONTINUED] tiZANidine (ZANAFLEX) 4 MG tablet Take 1 tablet (4 mg total) by mouth every 12 (twelve) hours as needed for muscle spasms. (Patient not taking: Reported on 12/10/2018)   No facility-administered encounter medications on file as of 12/10/2018.      Review of Systems  Review of Systems  Constitutional: Negative.  Negative for chills and fever.  HENT: Negative.   Respiratory: Negative for cough, shortness of breath and wheezing.   Cardiovascular: Negative.  Negative for chest pain, palpitations and leg swelling.  Gastrointestinal: Negative.   Allergic/Immunologic: Negative.   Neurological: Negative.   Psychiatric/Behavioral: Negative.        Physical Exam  BP 116/70 (BP Location: Left Arm, Patient Position: Sitting, Cuff Size: Normal)   Pulse 83   Ht 5\' 2"  (1.575 m)   Wt 169 lb 3.2 oz (76.7 kg)   SpO2 94% Comment: on 3L pulse O2  BMI 30.95 kg/m   Wt Readings from Last 5 Encounters:  12/10/18 169 lb 3.2 oz (76.7 kg)  11/12/18 168 lb (76.2 kg)  10/20/18 168 lb 4 oz (76.3 kg)  08/04/18 168 lb 8 oz (76.4 kg)  06/19/18 158 lb 6 oz (71.8 kg)     Physical Exam Vitals signs and nursing note reviewed.  Constitutional:      General: She is not in acute distress.    Appearance: She is well-developed.  Cardiovascular:     Rate and Rhythm: Normal rate and regular  rhythm.  Pulmonary:     Effort: Pulmonary effort is normal. No respiratory distress.     Breath sounds: Normal breath sounds. No wheezing or rhonchi.  Musculoskeletal:        General: No swelling.  Neurological:     Mental Status: She is alert and oriented to person, place, and time.       Assessment & Plan:   Tobacco abuse Discussed smoking cessation today. Smoking cessation instruction/counseling given:  counseled patient on the dangers of tobacco  use, advised patient to stop smoking, and reviewed strategies to maximize success   She will continue to participate in the low-dose CT scan lung cancer screening trial   COPD (chronic obstructive pulmonary disease) ([place]) To severe obstructive lung disease.  Patient continues to smoke.  She has benefited from Trelegy and albuterol.  Patient Instructions  Continue Trelegy daily.  Rinse after each use. Take albuterol 2 puffs up to every 4 hours if needed for shortness of breath Continue oxygen at 2 L nasal cannula while at rest and 3 L nasal cannula at night Please quit smoking Follow-up with Dr. Lamonte Sakai in 6 months or sooner if needed    Chronic hypoxemic respiratory failure [facility]) Patient is using 2 L nasal cannula at daytime and 3 L nasal cannula at night per recent results of ONO.   Patient Instructions  Continue Trelegy daily.  Rinse after each use. Take albuterol 2 puffs up to every 4 hours if needed for shortness of breath Continue oxygen at 2 L nasal cannula while at rest and 3 L nasal cannula at night Please quit smoking Follow-up with Dr. Lamonte Sakai in 6 months or sooner if needed       Fenton Foy, NP 12/11/2018

## 2018-12-10 NOTE — Patient Instructions (Signed)
Continue Trelegy daily.  Rinse after each use. Take albuterol 2 puffs up to every 4 hours if needed for shortness of breath Continue oxygen at 2 L nasal cannula while at rest and 3 L nasal cannula at night Please quit smoking Follow-up with Dr. Lamonte Sakai in 6 months or sooner if needed

## 2018-12-11 ENCOUNTER — Encounter: Payer: Medicare HMO | Attending: Physical Medicine & Rehabilitation

## 2018-12-11 ENCOUNTER — Encounter: Payer: Self-pay | Admitting: Physical Medicine & Rehabilitation

## 2018-12-11 ENCOUNTER — Encounter: Payer: Self-pay | Admitting: Nurse Practitioner

## 2018-12-11 ENCOUNTER — Ambulatory Visit (HOSPITAL_BASED_OUTPATIENT_CLINIC_OR_DEPARTMENT_OTHER): Payer: Medicare HMO | Admitting: Physical Medicine & Rehabilitation

## 2018-12-11 VITALS — BP 113/66 | HR 80 | Ht 62.0 in | Wt 168.0 lb

## 2018-12-11 DIAGNOSIS — M47816 Spondylosis without myelopathy or radiculopathy, lumbar region: Secondary | ICD-10-CM | POA: Diagnosis not present

## 2018-12-11 DIAGNOSIS — G8929 Other chronic pain: Secondary | ICD-10-CM | POA: Diagnosis present

## 2018-12-11 DIAGNOSIS — M48061 Spinal stenosis, lumbar region without neurogenic claudication: Secondary | ICD-10-CM | POA: Diagnosis not present

## 2018-12-11 DIAGNOSIS — M961 Postlaminectomy syndrome, not elsewhere classified: Secondary | ICD-10-CM | POA: Insufficient documentation

## 2018-12-11 NOTE — Progress Notes (Signed)
Left Lumbar L3, L4  medial branch blocks and L 5 dorsal ramus injection under fluoroscopic guidance   Indication: Left Lumbar pain which is not relieved by medication management or other conservative care and interfering with self-care and mobility.  Informed consent was obtained after describing risks and benefits of the procedure with the patient, this includes bleeding, bruising, infection, paralysis and medication side effects.  The patient wishes to proceed and has given written consent.  The patient was placed in a prone position.  The lumbar area was marked and prepped with Betadine.  One mL of 1% lidocaine was injected into each of 3 areas into the skin and subcutaneous tissue.  Then a 22-gauge 5in spinal needle was inserted targeting the junction of the left S1 superior articular process and sacral ala junction.  Needle was advanced under fluoroscopic guidance.  Bone contact was made. Isovue 200 was injected x 0.5 mL demonstrating no intravascular uptake.  Then a solution containing  2% MPF lidocaine was injected x 0.5 mL.  Then the left L5 superior articular process in transverse process junction was targeted.  Bone contact was made. Isovue 200 was injected x 0.5 mL demonstrating no intravascular uptake.  Then a solution containing 2% MPF lidocaine was injected x 0.5 mL.  Then the left L4 superior articular process in transverse process junction was targeted.  Bone contact was made.  Isovue 200 was injected x 0.5 mL demonstrating no intravascular uptake.  Then a solution containing  2% MPF lidocaine was injected x 0.5 mL.  Patient tolerated procedure well.  Post procedure instructions were given.  Could use 3.5 inch quincke next visit

## 2018-12-11 NOTE — Assessment & Plan Note (Signed)
Discussed smoking cessation today. Smoking cessation instruction/counseling given:  counseled patient on the dangers of tobacco use, advised patient to stop smoking, and reviewed strategies to maximize success   She will continue to participate in the low-dose CT scan lung cancer screening trial

## 2018-12-11 NOTE — Patient Instructions (Signed)

## 2018-12-11 NOTE — Progress Notes (Signed)
  PROCEDURE RECORD [facility]   Name: Tonya Bradley DOB:16-Jan-1951 MRN: [id number]  Date:12/11/2018  Physician: Alysia Penna, MD    Nurse/CMA: Bright CMA / Truman Hayward CMA  Allergies: No Known Allergies  Consent Signed: Yes.    Is patient diabetic? No.  CBG today? NA  Pregnant: No. LMP: No LMP recorded. Patient is postmenopausal. (age [age])  Anticoagulants: no Anti-inflammatory: no Antibiotics: no  Procedure: Left L3-5 Medial Branch Block Position: Prone  : Start Time: 12:15pm End Time: 12:23pm Fluoro Time: 12  RN/CMA Bright CMA Lee, CMA    Time 1145AM 12:30pm    BP 113/66 120/68    Pulse 80 79    Respirations 16 16    O2 Sat 89 86(false nails)    S/S 6 6    Pain Level 7/10 3/10     D/C home with Husband, [name] & O X 3, D/C instructions reviewed, and sits independently.

## 2018-12-11 NOTE — Assessment & Plan Note (Signed)
Patient is using 2 L nasal cannula at daytime and 3 L nasal cannula at night per recent results of ONO.   Patient Instructions  Continue Trelegy daily.  Rinse after each use. Take albuterol 2 puffs up to every 4 hours if needed for shortness of breath Continue oxygen at 2 L nasal cannula while at rest and 3 L nasal cannula at night Please quit smoking Follow-up with Dr. Lamonte Sakai in 6 months or sooner if needed

## 2018-12-11 NOTE — Assessment & Plan Note (Signed)
To severe obstructive lung disease.  Patient continues to smoke.  She has benefited from Trelegy and albuterol.  Patient Instructions  Continue Trelegy daily.  Rinse after each use. Take albuterol 2 puffs up to every 4 hours if needed for shortness of breath Continue oxygen at 2 L nasal cannula while at rest and 3 L nasal cannula at night Please quit smoking Follow-up with Dr. Lamonte Sakai in 6 months or sooner if needed

## 2018-12-14 ENCOUNTER — Encounter: Payer: Medicare HMO | Admitting: Physical Therapy

## 2018-12-16 ENCOUNTER — Ambulatory Visit: Payer: Medicare HMO | Admitting: Physical Therapy

## 2018-12-18 ENCOUNTER — Other Ambulatory Visit: Payer: Self-pay | Admitting: Acute Care

## 2018-12-18 ENCOUNTER — Ambulatory Visit
Admission: RE | Admit: 2018-12-18 | Discharge: 2018-12-18 | Disposition: A | Discharge status: Home / Self Care | Payer: Medicare HMO | Source: Ambulatory Visit | Attending: Family Medicine | Admitting: Family Medicine

## 2018-12-18 ENCOUNTER — Other Ambulatory Visit: Payer: Self-pay | Admitting: Family Medicine

## 2018-12-18 DIAGNOSIS — F419 Anxiety disorder, unspecified: Secondary | ICD-10-CM

## 2018-12-18 DIAGNOSIS — F17210 Nicotine dependence, cigarettes, uncomplicated: Principal | ICD-10-CM

## 2018-12-18 DIAGNOSIS — Z780 Asymptomatic menopausal state: Secondary | ICD-10-CM

## 2018-12-18 DIAGNOSIS — M8589 Other specified disorders of bone density and structure, multiple sites: Secondary | ICD-10-CM | POA: Diagnosis not present

## 2018-12-18 DIAGNOSIS — Z87891 Personal history of nicotine dependence: Secondary | ICD-10-CM

## 2018-12-18 DIAGNOSIS — Z122 Encounter for screening for malignant neoplasm of respiratory organs: Secondary | ICD-10-CM

## 2018-12-18 DIAGNOSIS — Z1239 Encounter for other screening for malignant neoplasm of breast: Secondary | ICD-10-CM

## 2018-12-18 DIAGNOSIS — Z1231 Encounter for screening mammogram for malignant neoplasm of breast: Secondary | ICD-10-CM | POA: Diagnosis not present

## 2018-12-21 ENCOUNTER — Ambulatory Visit: Payer: Medicare HMO | Admitting: Physical Therapy

## 2018-12-23 ENCOUNTER — Ambulatory Visit: Payer: Medicare HMO | Admitting: Physical Therapy

## 2018-12-25 DIAGNOSIS — M79675 Pain in left toe(s): Secondary | ICD-10-CM | POA: Diagnosis not present

## 2018-12-25 DIAGNOSIS — L600 Ingrowing nail: Secondary | ICD-10-CM | POA: Diagnosis not present

## 2018-12-25 DIAGNOSIS — M79674 Pain in right toe(s): Secondary | ICD-10-CM | POA: Diagnosis not present

## 2018-12-25 DIAGNOSIS — B351 Tinea unguium: Secondary | ICD-10-CM | POA: Diagnosis not present

## 2018-12-28 ENCOUNTER — Encounter: Payer: Medicare HMO | Admitting: Physical Therapy

## 2018-12-28 ENCOUNTER — Ambulatory Visit: Payer: Medicare HMO | Admitting: Physical Therapy

## 2018-12-28 ENCOUNTER — Encounter: Payer: Self-pay | Admitting: Physical Therapy

## 2018-12-28 DIAGNOSIS — E876 Hypokalemia: Secondary | ICD-10-CM | POA: Diagnosis not present

## 2018-12-28 DIAGNOSIS — Z803 Family history of malignant neoplasm of breast: Secondary | ICD-10-CM | POA: Diagnosis not present

## 2018-12-28 DIAGNOSIS — R109 Unspecified abdominal pain: Secondary | ICD-10-CM | POA: Diagnosis not present

## 2018-12-28 DIAGNOSIS — Z8249 Family history of ischemic heart disease and other diseases of the circulatory system: Secondary | ICD-10-CM | POA: Diagnosis not present

## 2018-12-28 DIAGNOSIS — Z79899 Other long term (current) drug therapy: Secondary | ICD-10-CM

## 2018-12-28 DIAGNOSIS — G8929 Other chronic pain: Secondary | ICD-10-CM | POA: Diagnosis present

## 2018-12-28 DIAGNOSIS — M810 Age-related osteoporosis without current pathological fracture: Secondary | ICD-10-CM | POA: Diagnosis present

## 2018-12-28 DIAGNOSIS — A045 Campylobacter enteritis: Principal | ICD-10-CM | POA: Diagnosis present

## 2018-12-28 DIAGNOSIS — G894 Chronic pain syndrome: Secondary | ICD-10-CM | POA: Diagnosis not present

## 2018-12-28 DIAGNOSIS — K6389 Other specified diseases of intestine: Secondary | ICD-10-CM | POA: Diagnosis not present

## 2018-12-28 DIAGNOSIS — Z8719 Personal history of other diseases of the digestive system: Secondary | ICD-10-CM

## 2018-12-28 DIAGNOSIS — Z8349 Family history of other endocrine, nutritional and metabolic diseases: Secondary | ICD-10-CM

## 2018-12-28 DIAGNOSIS — I4892 Unspecified atrial flutter: Secondary | ICD-10-CM | POA: Diagnosis present

## 2018-12-28 DIAGNOSIS — F419 Anxiety disorder, unspecified: Secondary | ICD-10-CM | POA: Diagnosis present

## 2018-12-28 DIAGNOSIS — Z9981 Dependence on supplemental oxygen: Secondary | ICD-10-CM

## 2018-12-28 DIAGNOSIS — Z66 Do not resuscitate: Secondary | ICD-10-CM | POA: Diagnosis present

## 2018-12-28 DIAGNOSIS — R1084 Generalized abdominal pain: Secondary | ICD-10-CM | POA: Diagnosis not present

## 2018-12-28 DIAGNOSIS — Z807 Family history of other malignant neoplasms of lymphoid, hematopoietic and related tissues: Secondary | ICD-10-CM

## 2018-12-28 DIAGNOSIS — Z823 Family history of stroke: Secondary | ICD-10-CM | POA: Diagnosis not present

## 2018-12-28 DIAGNOSIS — R52 Pain, unspecified: Secondary | ICD-10-CM | POA: Diagnosis not present

## 2018-12-28 DIAGNOSIS — M479 Spondylosis, unspecified: Secondary | ICD-10-CM | POA: Diagnosis present

## 2018-12-28 DIAGNOSIS — K219 Gastro-esophageal reflux disease without esophagitis: Secondary | ICD-10-CM | POA: Diagnosis not present

## 2018-12-28 DIAGNOSIS — E7800 Pure hypercholesterolemia, unspecified: Secondary | ICD-10-CM | POA: Diagnosis present

## 2018-12-28 DIAGNOSIS — K589 Irritable bowel syndrome without diarrhea: Secondary | ICD-10-CM | POA: Diagnosis present

## 2018-12-28 DIAGNOSIS — A040 Enteropathogenic Escherichia coli infection: Secondary | ICD-10-CM | POA: Diagnosis not present

## 2018-12-28 DIAGNOSIS — F329 Major depressive disorder, single episode, unspecified: Secondary | ICD-10-CM | POA: Diagnosis present

## 2018-12-28 DIAGNOSIS — R197 Diarrhea, unspecified: Secondary | ICD-10-CM | POA: Diagnosis not present

## 2018-12-28 DIAGNOSIS — Z85828 Personal history of other malignant neoplasm of skin: Secondary | ICD-10-CM

## 2018-12-28 DIAGNOSIS — Z800 Family history of malignant neoplasm of digestive organs: Secondary | ICD-10-CM | POA: Diagnosis not present

## 2018-12-28 DIAGNOSIS — R1111 Vomiting without nausea: Secondary | ICD-10-CM | POA: Diagnosis not present

## 2018-12-28 DIAGNOSIS — I2510 Atherosclerotic heart disease of native coronary artery without angina pectoris: Secondary | ICD-10-CM | POA: Diagnosis not present

## 2018-12-28 DIAGNOSIS — F17210 Nicotine dependence, cigarettes, uncomplicated: Secondary | ICD-10-CM | POA: Diagnosis present

## 2018-12-28 DIAGNOSIS — J9611 Chronic respiratory failure with hypoxia: Secondary | ICD-10-CM | POA: Diagnosis not present

## 2018-12-28 DIAGNOSIS — Z7982 Long term (current) use of aspirin: Secondary | ICD-10-CM

## 2018-12-28 DIAGNOSIS — M5442 Lumbago with sciatica, left side: Principal | ICD-10-CM

## 2018-12-28 DIAGNOSIS — Z7951 Long term (current) use of inhaled steroids: Secondary | ICD-10-CM

## 2018-12-28 DIAGNOSIS — E860 Dehydration: Secondary | ICD-10-CM | POA: Diagnosis present

## 2018-12-28 DIAGNOSIS — K529 Noninfective gastroenteritis and colitis, unspecified: Secondary | ICD-10-CM | POA: Diagnosis not present

## 2018-12-28 DIAGNOSIS — M6281 Muscle weakness (generalized): Secondary | ICD-10-CM

## 2018-12-28 DIAGNOSIS — D72829 Elevated white blood cell count, unspecified: Secondary | ICD-10-CM | POA: Diagnosis not present

## 2018-12-28 DIAGNOSIS — M5441 Lumbago with sciatica, right side: Principal | ICD-10-CM

## 2018-12-28 DIAGNOSIS — J439 Emphysema, unspecified: Secondary | ICD-10-CM | POA: Diagnosis not present

## 2018-12-28 NOTE — Therapy (Signed)
[facility] [address], [place], [place], [address] Phone: [phone]   Fax:  [phone]  Physical Therapy Treatment  Patient Details  Name: Tonya Bradley MRN: [id number] Date of Birth: 11-14-1951 Referring Provider (PT): Letta Pate Luanna Salk, MD   Encounter Date: 12/28/2018  PT End of Session - 12/28/18 1403    Visit Number  3    Date for PT Re-Evaluation  02/01/19    Authorization Type  [employer]    Authorization Time Period  12/07/18-05/08/19    PT Start Time  1400    PT Stop Time  1445    PT Time Calculation (min)  45 min    Activity Tolerance  Patient limited by pain;Patient tolerated treatment well    Behavior During Therapy  [facility] for tasks assessed/performed       Past Medical History:  Diagnosis Date  . Anemia   . Anxiety   . Arthritis    "back"  . Atrial flutter ([place]) 03/13/2016  . CAD (coronary artery disease)   . Cancer ([place])    skin cancer  . Cataract   . Colon polyp   . COPD (chronic obstructive pulmonary disease) ([place])   . Depression   . Dysrhythmia   . Emphysema of lung ([place])   . GERD (gastroesophageal reflux disease)   . Hypercholesterolemia   . Osteoporosis   . Oxygen deficiency     Past Surgical History:  Procedure Laterality Date  . A-FLUTTER ABLATION N/A 05/19/2017   Procedure: A-Flutter Ablation;  Surgeon: Evans Lance, MD;  Location: [place] [facility];  Service: Cardiovascular;  Laterality: N/A;  . APPENDECTOMY    . BASAL CELL CARCINOMA EXCISION    . CARDIAC CATHETERIZATION    . CATARACT EXTRACTION Bilateral   . CHOLECYSTECTOMY    . COLONOSCOPY N/A 06/23/2014   Dr. Rourk:Rectal and colonic polyps-removed, Pancolonic diverticulosis. lymphocytic colitis  . CORONARY ANGIOPLASTY WITH STENT PLACEMENT    . ESOPHAGEAL DILATION N/A 03/13/2016   Procedure: ESOPHAGEAL DILATION;  Surgeon: Daneil Dolin, MD;  [facility];  Service: Endoscopy;  Laterality: N/A;  .  ESOPHAGOGASTRODUODENOSCOPY N/A 03/13/2016   Procedure: ESOPHAGOGASTRODUODENOSCOPY (EGD);  Surgeon: Daneil Dolin, MD;  Location[facility];  Service: Endoscopy;  Laterality: N/A;  0930  . EYE SURGERY     cataract removed, bilateral  . LUMBAR LAMINECTOMY/DECOMPRESSION MICRODISCECTOMY N/A 01/19/2018   Procedure: L4-5 DECOMPRESSION;  Surgeon: Marybelle Killings, MD;  Location: [facility];  Service: Orthopedics;  Laterality: N/A;    There were no vitals filed for this visit.  Subjective Assessment - 12/28/18 1401    Subjective  Pt states she had injections and they didn't do a thing for her pain.  Pain fluctuates with the weather and since it's cloudy today my pain is higher.  Pain is 7-8/10.    Pertinent History  Pt uses oxygen (supposed to be 24/7 although she doesn't always use it)    Limitations  Standing;Sitting;Walking;House hold activities;Lifting    How long can you sit comfortably?  1 hour    How long can you stand comfortably?  1 hour    How long can you walk comfortably?  several blocks 4x/day (20 min walk)    Patient Stated Goals  put on panty hose, shoes, boots    Currently in Pain?  Yes    Pain Score  8     Pain Location  Back    Pain Orientation  Right;Left;Lower  Pain Descriptors / Indicators  Aching    Pain Type  Chronic pain    Pain Radiating Towards  bil hips and buttocks    Pain Onset  More than a month ago    Pain Frequency  Constant                       [facility] Adult PT Treatment/Exercise - 12/28/18 0001      Exercises   Exercises  Lumbar;Knee/Hip      Lumbar Exercises: Stretches   Single Knee to Chest Stretch  Right;Left;3 reps;10 seconds      Lumbar Exercises: Aerobic   Nustep  L2 x 5 min   PT present to monitor     Lumbar Exercises: Supine   Pelvic Tilt  10 reps   Pt reported pain   Clam  15 reps    Clam Limitations  yellow tband around knees      Knee/Hip Exercises: Seated   Long Arc Quad  Strengthening;10 reps;Weights    Long Arc Quad  Weight  1 lbs.    Marching  Both;20 reps    Marching Limitations  PT cued pelvic floor and TrA      Knee/Hip Exercises: Supine   Hip Adduction Isometric  Both;10 reps   5 sec with core cueing     Knee/Hip Exercises: Sidelying   Clams  2x10      Modalities   Modalities  Moist Heat      Moist Heat Therapy   Number Minutes Moist Heat  10 Minutes    Moist Heat Location  Lumbar Spine      Manual Therapy   Manual Therapy  Soft tissue mobilization    Manual therapy comments  using Addaday    Soft tissue mobilization  bil gluteals and piriformis               PT Short Term Goals - 12/09/18 1444      PT SHORT TERM GOAL #1   Title  Pt will be ind in initial HEP    Status  Achieved        PT Long Term Goals - 12/07/18 1932      PT LONG TERM GOAL #1   Title  Pt will be ind in advanced HEP.    Time  8    Period  Weeks    Status  New    Target Date  02/01/19      PT LONG TERM GOAL #2   Title  Pt will reduce FOTO score to 40% or less demonstrating less limitation with daily tasks.    Time  8    Period  Weeks    Status  New    Target Date  02/01/19      PT LONG TERM GOAL #3   Title  Pt will achieve strength rating of at least 4/5 for all LE muscle groups so that she may perform more physical activities throughout the day.    Time  8    Period  Weeks    Status  New    Target Date  02/01/19      PT LONG TERM GOAL #4   Title  Pt will be able to perform bil SLS with UE support as needed for at least 20 sec demonstrating less fall risk and improved safety.    Time  8    Period  Weeks    Status  New    Target  Date  02/01/19      PT LONG TERM GOAL #5   Title  Pt will report improved ease of donning shoes, socks, and panty hose by at least 30%    Time  8    Period  Weeks    Status  New    Target Date  02/01/19              Patient will benefit from skilled therapeutic intervention in order to improve the following deficits and impairments:     Visit  Diagnosis: Chronic bilateral low back pain with bilateral sciatica  Muscle weakness (generalized)     Problem List Patient Active Problem List   Diagnosis Date Noted  . Dysphonia 06/19/2018  . Bilateral lower extremity edema 02/16/2018  . Lumbar stenosis 01/19/2018  . GERD (gastroesophageal reflux disease) 11/18/2017  . 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 COPD (chronic obstructive pulmonary disease) ([place]) 09/09/2017  . Varicose veins of bilateral lower extremities with other complications 67/54/4920  . Chronic pain disorder 05/26/2017  . Back pain 05/26/2017  . Insomnia 05/26/2017  . Osteopenia 11/26/2016  . Other emphysema ([place]) 11/07/2016  . Chronic anxiety 11/07/2016  . Chronic hypoxemic respiratory failure ([place]) 11/07/2016  . Constipation 06/12/2016  . Right carotid bruit 04/03/2016  . CAD (coronary artery disease) 04/03/2016  . Tobacco abuse 04/03/2016  . Atrial flutter ([place]) 03/13/2016  . Hiatal hernia   . Reflux esophagitis   . Schatzki's ring   . Dysphagia 03/01/2016  . Solitary pulmonary nodule 03/01/2016  . 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 Microscopic colitis 08/15/2014  . Loose stools 06/08/2014  . BCC (basal cell carcinoma), face 09/13/2012   Baruch Merl, PT 12/28/18 2:41 PM   [facility] [address] [place], [place], [address] Phone: [phone]   Fax:  [phone]  Name: Chelse Matas MRN: [id number] Date of Birth: 01-11-1951

## 2018-12-29 ENCOUNTER — Other Ambulatory Visit: Payer: Self-pay | Admitting: Emergency Medicine

## 2018-12-29 ENCOUNTER — Encounter: Payer: Self-pay | Admitting: Family Medicine

## 2018-12-29 ENCOUNTER — Other Ambulatory Visit: Payer: Self-pay | Admitting: Family Medicine

## 2018-12-30 ENCOUNTER — Ambulatory Visit: Payer: Medicare HMO | Admitting: Physical Therapy

## 2018-12-30 ENCOUNTER — Encounter: Payer: Medicare HMO | Admitting: Physical Therapy

## 2018-12-30 ENCOUNTER — Encounter: Payer: Self-pay | Admitting: Physical Therapy

## 2018-12-30 DIAGNOSIS — M5441 Lumbago with sciatica, right side: Principal | ICD-10-CM

## 2018-12-30 DIAGNOSIS — G8929 Other chronic pain: Secondary | ICD-10-CM

## 2018-12-30 DIAGNOSIS — M6281 Muscle weakness (generalized): Secondary | ICD-10-CM

## 2018-12-30 DIAGNOSIS — M5442 Lumbago with sciatica, left side: Principal | ICD-10-CM

## 2018-12-30 NOTE — Therapy (Signed)
[facility] [address] [place], [place], [address] Phone: [phone]   Fax:  [phone]  Physical Therapy Treatment  Patient Details  Name: Tonya Bradley MRN: [id number] Date of Birth: 1951-03-01 Referring Provider (PT): Letta Pate Luanna Salk, MD   Encounter Date: 12/30/2018  PT End of Session - 12/30/18 1356    Visit Number  4    Date for PT Re-Evaluation  02/01/19    Authorization Type  [employer]    Authorization Time Period  12/07/18-05/08/19    PT Start Time  1357    PT Stop Time  3419    PT Time Calculation (min)  48 min    Activity Tolerance  Patient limited by pain;Patient tolerated treatment well    Behavior During Therapy  [facility] for tasks assessed/performed       Past Medical History:  Diagnosis Date  . Anemia   . Anxiety   . Arthritis    "back"  . Atrial flutter ([place]) 03/13/2016  . CAD (coronary artery disease)   . Cancer ([place])    skin cancer  . Cataract   . Colon polyp   . COPD (chronic obstructive pulmonary disease) ([place])   . Depression   . Dysrhythmia   . Emphysema of lung ([place])   . GERD (gastroesophageal reflux disease)   . Hypercholesterolemia   . Osteoporosis   . Oxygen deficiency     Past Surgical History:  Procedure Laterality Date  . A-FLUTTER ABLATION N/A 05/19/2017   Procedure: A-Flutter Ablation;  Surgeon: Evans Lance, MD;  Location: [place] [facility];  Service: Cardiovascular;  Laterality: N/A;  . APPENDECTOMY    . BASAL CELL CARCINOMA EXCISION    . CARDIAC CATHETERIZATION    . CATARACT EXTRACTION Bilateral   . CHOLECYSTECTOMY    . COLONOSCOPY N/A 06/23/2014   Dr. Rourk:Rectal and colonic polyps-removed, Pancolonic diverticulosis. lymphocytic colitis  . CORONARY ANGIOPLASTY WITH STENT PLACEMENT    . ESOPHAGEAL DILATION N/A 03/13/2016   Procedure: ESOPHAGEAL DILATION;  Surgeon: Daneil Dolin, MD;  [facility];  Service: Endoscopy;  Laterality: N/A;  .  ESOPHAGOGASTRODUODENOSCOPY N/A 03/13/2016   Procedure: ESOPHAGOGASTRODUODENOSCOPY (EGD);  Surgeon: Daneil Dolin, MD;  [facility];  Service: Endoscopy;  Laterality: N/A;  0930  . EYE SURGERY     cataract removed, bilateral  . LUMBAR LAMINECTOMY/DECOMPRESSION MICRODISCECTOMY N/A 01/19/2018   Procedure: L4-5 DECOMPRESSION;  Surgeon: Marybelle Killings, MD;  Location: [facility];  Service: Orthopedics;  Laterality: N/A;    There were no vitals filed for this visit.  Subjective Assessment - 12/30/18 1357    Subjective  Pt did well with last visit.  Pain is less today.  Pt went to gym yesterday and did treadmill and bike and some machines for stomach strength.  Pt reports she strained her Rt arm reaching for a drink yesterday.  Needs to avoid using it too much today.    Pertinent History  Pt uses oxygen (supposed to be 24/7 although she doesn't always use it)    Limitations  Standing;Sitting;Walking;House hold activities;Lifting    How long can you sit comfortably?  1 hour    How long can you stand comfortably?  1 hour    How long can you walk comfortably?  several blocks 4x/day (20 min walk)    Patient Stated Goals  put on panty hose, shoes, boots    Currently in Pain?  Yes    Pain Score  3     Pain Location  Back    Pain Orientation  Right;Left;Lower    Pain Descriptors / Indicators  Aching    Pain Type  Chronic pain    Pain Onset  More than a month ago    Pain Frequency  Intermittent                       [facility] Adult PT Treatment/Exercise - 12/30/18 0001      Lumbar Exercises: Stretches   Active Hamstring Stretch  Right;Left;2 reps;30 seconds    Active Hamstring Stretch Limitations  seated    Gastroc Stretch  Right;Left;30 seconds;2 reps      Lumbar Exercises: Aerobic   Nustep  L2 x 8'   PT present to discuss progress     Lumbar Exercises: Standing   Heel Raises  20 reps;1 second   bil UE support     Lumbar Exercises: Supine   Bent Knee Raise  20 reps;1  second    Bridge with [employer]  15 reps    Bridge with [employer] Limitations  PT cued breathing coordination and core    Large Ball Abdominal Isometric  5 reps   10 sec, with breathing cueing by PT   Other Supine Lumbar Exercises  bent knee fall out with core cueing x 20 reps, alt Lt and Rt      Knee/Hip Exercises: Standing   Hip Abduction  AROM;Stengthening;Both;10 reps;Knee straight;1 set    Hip Extension  AROM;Stengthening;Both;10 reps;Knee straight;1 set      Knee/Hip Exercises: Seated   Sit to Sand  10 reps;with UE support      Knee/Hip Exercises: Supine   Straight Leg Raises  Strengthening;Both;2 sets;5 sets      Modalities   Modalities  Moist Heat      Moist Heat Therapy   Number Minutes Moist Heat  10 Minutes    Moist Heat Location  Lumbar Spine               PT Short Term Goals - 12/30/18 1440      PT SHORT TERM GOAL #1   Title  Pt will be ind in initial HEP    Status  Achieved      PT SHORT TERM GOAL #2   Title  Pt will demonstrate proper bed mobility and sit to stand transfers with use of core and proper body mechanics with use of UEs as needed.    Status  Achieved        PT Long Term Goals - 12/07/18 1932      PT LONG TERM GOAL #1   Title  Pt will be ind in advanced HEP.    Time  8    Period  Weeks    Status  New    Target Date  02/01/19      PT LONG TERM GOAL #2   Title  Pt will reduce FOTO score to 40% or less demonstrating less limitation with daily tasks.    Time  8    Period  Weeks    Status  New    Target Date  02/01/19      PT LONG TERM GOAL #3   Title  Pt will achieve strength rating of at least 4/5 for all LE muscle groups so that she may perform more physical activities throughout the day.    Time  8    Period  Weeks  Status  New    Target Date  02/01/19      PT LONG TERM GOAL #4   Title  Pt will be able to perform bil SLS with UE support as needed for at least 20 sec demonstrating less fall risk and improved  safety.    Time  8    Period  Weeks    Status  New    Target Date  02/01/19      PT LONG TERM GOAL #5   Title  Pt will report improved ease of donning shoes, socks, and panty hose by at least 30%    Time  8    Period  Weeks    Status  New    Target Date  02/01/19            Plan - 12/30/18 1439    Clinical Impression Statement  Pt arrived with less pain today (3/10 vs 8/10 last session).  She reported she went to the gym yesterday and hopes to lose some weight via exercise and dieting.  She was able to incorporate standing ther ex for LEs today without difficulty.  Pt needs min/mod cueing for proper recruitment of deep transversus abdominus as not to "bear down".  Pt needed verbal cueing by PT for coordination of breath with supine bridges and SLR today which helped with abdominal recruitment.  Pt will continue to benefit from skilled PT for endurance, strength, flexibility, ROM and core stabilization to address deficits along POC.    Rehab Potential  Good    Clinical Impairments Affecting Rehab Potential  poor pulmonary health/endurance, uses oxygen    PT Frequency  2x / week    PT Duration  8 weeks    PT Treatment/Interventions  ADLs/Self Care Home Management;Electrical Stimulation;Moist Heat;Traction;Gait training;Functional mobility training;Therapeutic activities;Therapeutic exercise;Balance training;Neuromuscular re-education;Patient/family education;Manual techniques;Passive range of motion;Dry needling;Taping;Spinal Manipulations;Joint Manipulations    PT Next Visit Plan  update HEP, f/u on gym use, gentle mobility for trunk and hips as tolerated, LE strength, balance training, core, modalities for pain    PT Home Exercise Plan  Access Code: [email or web address]    Consulted and Agree with Plan of Care  Patient       Patient will benefit from skilled therapeutic intervention in order to improve the following deficits and impairments:  Abnormal gait, Decreased endurance, Hypomobility,  Cardiopulmonary status limiting activity, Impaired tone, Decreased activity tolerance, Decreased strength, Pain, Decreased balance, Decreased mobility, Difficulty walking, Postural dysfunction, Improper body mechanics, Decreased range of motion  Visit Diagnosis: Chronic bilateral low back pain with bilateral sciatica  Muscle weakness (generalized)     Problem List Patient Active Problem List   Diagnosis Date Noted  . Dysphonia 06/19/2018  . Bilateral lower extremity edema 02/16/2018  . Lumbar stenosis 01/19/2018  . GERD (gastroesophageal reflux disease) 11/18/2017  . 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 COPD (chronic obstructive pulmonary disease) ([place]) 09/09/2017  . Varicose veins of bilateral lower extremities with other complications 08/65/7846  . Chronic pain disorder 05/26/2017  . Back pain 05/26/2017  . Insomnia 05/26/2017  . Osteopenia 11/26/2016  . Other emphysema ([place]) 11/07/2016  . Chronic anxiety 11/07/2016  . Chronic hypoxemic respiratory failure ([place]) 11/07/2016  . Constipation 06/12/2016  . Right carotid bruit 04/03/2016  . CAD (coronary artery disease) 04/03/2016  . Tobacco abuse 04/03/2016  . Atrial flutter ([place]) 03/13/2016  . Hiatal hernia   . Reflux esophagitis   . Schatzki's ring   . Dysphagia  03/01/2016  . Solitary pulmonary nodule 03/01/2016  . 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 Microscopic colitis 08/15/2014  . Loose stools 06/08/2014  . BCC (basal cell carcinoma), face 09/13/2012   Baruch Merl, PT 12/30/18 2:41 PM   [facility] [address] [place], [place], [address] Phone: [phone]   Fax:  [phone]  Name: Kaylanni Ezelle MRN: [id number] Date of Birth: Sep 20, 1951

## 2019-01-01 ENCOUNTER — Inpatient Hospital Stay (HOSPITAL_COMMUNITY)
Admission: EM | Admit: 2019-01-01 | Discharge: 2019-01-03 | DRG: 372 | Disposition: A | Discharge status: Home / Self Care | Payer: Medicare HMO | Attending: Internal Medicine | Admitting: Internal Medicine

## 2019-01-01 ENCOUNTER — Other Ambulatory Visit: Payer: Self-pay

## 2019-01-01 ENCOUNTER — Emergency Department (HOSPITAL_COMMUNITY): Payer: Medicare HMO

## 2019-01-01 ENCOUNTER — Encounter (HOSPITAL_COMMUNITY): Payer: Self-pay | Admitting: Emergency Medicine

## 2019-01-01 DIAGNOSIS — J439 Emphysema, unspecified: Secondary | ICD-10-CM | POA: Diagnosis present

## 2019-01-01 DIAGNOSIS — E7800 Pure hypercholesterolemia, unspecified: Secondary | ICD-10-CM | POA: Diagnosis present

## 2019-01-01 DIAGNOSIS — Z803 Family history of malignant neoplasm of breast: Secondary | ICD-10-CM | POA: Diagnosis not present

## 2019-01-01 DIAGNOSIS — F419 Anxiety disorder, unspecified: Secondary | ICD-10-CM

## 2019-01-01 DIAGNOSIS — E860 Dehydration: Secondary | ICD-10-CM | POA: Diagnosis present

## 2019-01-01 DIAGNOSIS — E876 Hypokalemia: Secondary | ICD-10-CM | POA: Diagnosis present

## 2019-01-01 DIAGNOSIS — F17210 Nicotine dependence, cigarettes, uncomplicated: Secondary | ICD-10-CM | POA: Diagnosis present

## 2019-01-01 DIAGNOSIS — A045 Campylobacter enteritis: Secondary | ICD-10-CM | POA: Diagnosis present

## 2019-01-01 DIAGNOSIS — A040 Enteropathogenic Escherichia coli infection: Secondary | ICD-10-CM | POA: Diagnosis present

## 2019-01-01 DIAGNOSIS — Z807 Family history of other malignant neoplasms of lymphoid, hematopoietic and related tissues: Secondary | ICD-10-CM | POA: Diagnosis not present

## 2019-01-01 DIAGNOSIS — M810 Age-related osteoporosis without current pathological fracture: Secondary | ICD-10-CM | POA: Diagnosis present

## 2019-01-01 DIAGNOSIS — R109 Unspecified abdominal pain: Secondary | ICD-10-CM

## 2019-01-01 DIAGNOSIS — K529 Noninfective gastroenteritis and colitis, unspecified: Secondary | ICD-10-CM | POA: Diagnosis present

## 2019-01-01 DIAGNOSIS — D72829 Elevated white blood cell count, unspecified: Secondary | ICD-10-CM

## 2019-01-01 DIAGNOSIS — Z720 Tobacco use: Secondary | ICD-10-CM

## 2019-01-01 DIAGNOSIS — J9611 Chronic respiratory failure with hypoxia: Secondary | ICD-10-CM | POA: Diagnosis present

## 2019-01-01 DIAGNOSIS — K589 Irritable bowel syndrome without diarrhea: Secondary | ICD-10-CM | POA: Diagnosis present

## 2019-01-01 DIAGNOSIS — K219 Gastro-esophageal reflux disease without esophagitis: Secondary | ICD-10-CM | POA: Diagnosis present

## 2019-01-01 DIAGNOSIS — I2510 Atherosclerotic heart disease of native coronary artery without angina pectoris: Secondary | ICD-10-CM | POA: Diagnosis present

## 2019-01-01 DIAGNOSIS — G894 Chronic pain syndrome: Secondary | ICD-10-CM | POA: Diagnosis not present

## 2019-01-01 DIAGNOSIS — F329 Major depressive disorder, single episode, unspecified: Secondary | ICD-10-CM | POA: Diagnosis present

## 2019-01-01 DIAGNOSIS — Z823 Family history of stroke: Secondary | ICD-10-CM | POA: Diagnosis not present

## 2019-01-01 DIAGNOSIS — I4892 Unspecified atrial flutter: Secondary | ICD-10-CM | POA: Diagnosis present

## 2019-01-01 DIAGNOSIS — Z800 Family history of malignant neoplasm of digestive organs: Secondary | ICD-10-CM | POA: Diagnosis not present

## 2019-01-01 DIAGNOSIS — G8929 Other chronic pain: Secondary | ICD-10-CM | POA: Diagnosis present

## 2019-01-01 DIAGNOSIS — Z9981 Dependence on supplemental oxygen: Secondary | ICD-10-CM | POA: Diagnosis not present

## 2019-01-01 DIAGNOSIS — Z66 Do not resuscitate: Secondary | ICD-10-CM | POA: Diagnosis present

## 2019-01-01 DIAGNOSIS — Z8719 Personal history of other diseases of the digestive system: Secondary | ICD-10-CM | POA: Diagnosis not present

## 2019-01-01 DIAGNOSIS — Z8249 Family history of ischemic heart disease and other diseases of the circulatory system: Secondary | ICD-10-CM | POA: Diagnosis not present

## 2019-01-01 DIAGNOSIS — J449 Chronic obstructive pulmonary disease, unspecified: Secondary | ICD-10-CM | POA: Diagnosis present

## 2019-01-01 LAB — CBC WITH DIFFERENTIAL/PLATELET
Abs Immature Granulocytes: 0.15 10*3/uL — ABNORMAL HIGH (ref 0.00–0.07)
Basophils Absolute: 0.1 10*3/uL (ref 0.0–0.1)
Basophils Relative: 0 %
EOS PCT: 0 %
Eosinophils Absolute: 0.1 10*3/uL (ref 0.0–0.5)
HCT: 47.6 % — ABNORMAL HIGH (ref 36.0–46.0)
Hemoglobin: 15.2 g/dL — ABNORMAL HIGH (ref 12.0–15.0)
Immature Granulocytes: 1 %
Lymphocytes Relative: 5 %
Lymphs Abs: 1.2 10*3/uL (ref 0.7–4.0)
MCH: 30.5 pg (ref 26.0–34.0)
MCHC: 31.9 g/dL (ref 30.0–36.0)
MCV: 95.4 fL (ref 80.0–100.0)
Monocytes Absolute: 1.1 10*3/uL — ABNORMAL HIGH (ref 0.1–1.0)
Monocytes Relative: 5 %
Neutro Abs: 19.8 10*3/uL — ABNORMAL HIGH (ref 1.7–7.7)
Neutrophils Relative %: 89 %
Platelets: 313 10*3/uL (ref 150–400)
RBC: 4.99 MIL/uL (ref 3.87–5.11)
RDW: 13.5 % (ref 11.5–15.5)
WBC: 22.4 10*3/uL — ABNORMAL HIGH (ref 4.0–10.5)
nRBC: 0 % (ref 0.0–0.2)

## 2019-01-01 LAB — COMPREHENSIVE METABOLIC PANEL
ALT: 26 U/L (ref 0–44)
ANION GAP: 12 (ref 5–15)
AST: 27 U/L (ref 15–41)
Albumin: 4.4 g/dL (ref 3.5–5.0)
Alkaline Phosphatase: 120 U/L (ref 38–126)
BUN: 20 mg/dL (ref 8–23)
CO2: 26 mmol/L (ref 22–32)
Calcium: 9.8 mg/dL (ref 8.9–10.3)
Chloride: 101 mmol/L (ref 98–111)
Creatinine, Ser: 0.64 mg/dL (ref 0.44–1.00)
GFR calc Af Amer: >60 mL/min (ref >60)
GFR calc non Af Amer: >60 mL/min (ref >60)
GLUCOSE: 140 mg/dL — AB (ref 70–99)
Potassium: 3.2 mmol/L — ABNORMAL LOW (ref 3.5–5.1)
Sodium: 139 mmol/L (ref 135–145)
Total Bilirubin: 0.6 mg/dL (ref 0.3–1.2)
Total Protein: 8.3 g/dL — ABNORMAL HIGH (ref 6.5–8.1)

## 2019-01-01 LAB — URINALYSIS, ROUTINE W REFLEX MICROSCOPIC
Glucose, UA: NEGATIVE mg/dL
Hgb urine dipstick: NEGATIVE
Ketones, ur: 15 mg/dL — AB
Leukocytes, UA: NEGATIVE
Nitrite: NEGATIVE
Protein, ur: 30 mg/dL — AB
Specific Gravity, Urine: >1.03 — ABNORMAL HIGH (ref 1.005–1.030)
pH: 5 (ref 5.0–8.0)

## 2019-01-01 LAB — URINALYSIS, MICROSCOPIC (REFLEX)

## 2019-01-01 LAB — LIPASE, BLOOD: Lipase: 35 U/L (ref 11–51)

## 2019-01-01 MED ORDER — NICOTINE 21 MG/24HR TD PT24
21.0000 mg | MEDICATED_PATCH | Freq: Every day | TRANSDERMAL | Status: DC
  Administered 2019-01-02 – 2019-01-03 (×2): 21 mg via TRANSDERMAL
  Filled 2019-01-01 (×2): qty 1

## 2019-01-01 MED ORDER — SODIUM CHLORIDE 0.9 % IV BOLUS
1000.0000 mL | Freq: Once | INTRAVENOUS | Status: AC
  Administered 2019-01-01: 1000 mL via INTRAVENOUS

## 2019-01-01 MED ORDER — IOPAMIDOL (ISOVUE-300) INJECTION 61%
100.0000 mL | Freq: Once | INTRAVENOUS | Status: AC | PRN
  Administered 2019-01-01: 100 mL via INTRAVENOUS

## 2019-01-01 MED ORDER — SODIUM CHLORIDE (PF) 0.9 % IJ SOLN
INTRAMUSCULAR | Status: AC
  Administered 2019-01-01: 23:00:00
  Filled 2019-01-01: qty 50

## 2019-01-01 MED ORDER — MORPHINE SULFATE (PF) 4 MG/ML IV SOLN
4.0000 mg | Freq: Once | INTRAVENOUS | Status: AC
  Administered 2019-01-01: 4 mg via INTRAVENOUS
  Filled 2019-01-01: qty 1

## 2019-01-01 MED ORDER — IOPAMIDOL (ISOVUE-300) INJECTION 61%
INTRAVENOUS | Status: AC
  Filled 2019-01-01: qty 100

## 2019-01-01 MED ORDER — ONDANSETRON HCL 4 MG/2ML IJ SOLN
4.0000 mg | Freq: Once | INTRAMUSCULAR | Status: AC
  Administered 2019-01-01: 4 mg via INTRAVENOUS
  Filled 2019-01-01: qty 2

## 2019-01-01 MED ORDER — POTASSIUM CHLORIDE 10 MEQ/100ML IV SOLN
10.0000 meq | INTRAVENOUS | Status: AC
  Administered 2019-01-02 (×3): 10 meq via INTRAVENOUS
  Filled 2019-01-01 (×5): qty 100

## 2019-01-01 NOTE — ED Provider Notes (Signed)
[facility] DEPT Provider Note   CSN: [id number] Arrival date & time: 01/01/19  2006     History   Chief Complaint Chief Complaint  Patient presents with  . Abdominal Pain    HPI Tonya Bradley is a 68 y.o. female.  HPI Patient is a 68 year old female presents the emergency department nausea vomiting and diarrhea as well as left lower quadrant abdominal pain since 4 PM today.  Denies blood in her vomit and blood in her stool.  Denies fevers and chills.  Symptoms are moderate in severity.  Worse with palpation of her left lower quadrant.  No other complaints at this time.  No recent sick contacts.  She does have a history of diverticulosis.   Past Medical History:  Diagnosis Date  . Anemia   . Anxiety   . Arthritis    "back"  . Atrial flutter ([place]) 03/13/2016  . CAD (coronary artery disease)   . Cancer ([place])    skin cancer  . Cataract   . Colon polyp   . COPD (chronic obstructive pulmonary disease) ([place])   . Depression   . Dysrhythmia   . Emphysema of lung ([place])   . GERD (gastroesophageal reflux disease)   . Hypercholesterolemia   . Osteoporosis   . Oxygen deficiency     Patient Active Problem List   Diagnosis Date Noted  . Dysphonia 06/19/2018  . Bilateral lower extremity edema 02/16/2018  . Lumbar stenosis 01/19/2018  . GERD (gastroesophageal reflux disease) 11/18/2017  . 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 COPD (chronic obstructive pulmonary disease) ([place]) 09/09/2017  . Varicose veins of bilateral lower extremities with other complications 28/31/5176  . Chronic pain disorder 05/26/2017  . Back pain 05/26/2017  . Insomnia 05/26/2017  . Osteopenia 11/26/2016  . Other emphysema ([place]) 11/07/2016  . Chronic anxiety 11/07/2016  . Chronic hypoxemic respiratory failure ([place]) 11/07/2016  . Constipation 06/12/2016  . Right carotid bruit 04/03/2016  . CAD (coronary artery disease) 04/03/2016  .  Tobacco abuse 04/03/2016  . Atrial flutter ([employer] [place]) 03/13/2016  . Hiatal hernia   . Reflux esophagitis   . Schatzki's ring   . Dysphagia 03/01/2016  . Solitary pulmonary nodule 03/01/2016  . 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 Microscopic colitis 08/15/2014  . Loose stools 06/08/2014  . BCC (basal cell carcinoma), face 09/13/2012    Past Surgical History:  Procedure Laterality Date  . A-FLUTTER ABLATION N/A 05/19/2017   Procedure: A-Flutter Ablation;  Surgeon: Evans Lance, MD;  Location: [facility];  Service: Cardiovascular;  Laterality: N/A;  . APPENDECTOMY    . BASAL CELL CARCINOMA EXCISION    . CARDIAC CATHETERIZATION    . CATARACT EXTRACTION Bilateral   . CHOLECYSTECTOMY    . COLONOSCOPY N/A 06/23/2014   Dr. Rourk:Rectal and colonic polyps-removed, Pancolonic diverticulosis. lymphocytic colitis  . CORONARY ANGIOPLASTY WITH STENT PLACEMENT    . ESOPHAGEAL DILATION N/A 03/13/2016   Procedure: ESOPHAGEAL DILATION;  Surgeon: Daneil Dolin, MD;  [facility];  Service: Endoscopy;  Laterality: N/A;  . ESOPHAGOGASTRODUODENOSCOPY N/A 03/13/2016   Procedure: ESOPHAGOGASTRODUODENOSCOPY (EGD);  Surgeon: Daneil Dolin, MD;  [facility];  Service: Endoscopy;  Laterality: N/A;  0930  . EYE SURGERY     cataract removed, bilateral  . LUMBAR LAMINECTOMY/DECOMPRESSION MICRODISCECTOMY N/A 01/19/2018   Procedure: L4-5 DECOMPRESSION;  Surgeon: Marybelle Killings, MD;  Location: [facility];  Service: Orthopedics;  Laterality: N/A;  OB History   No obstetric history on file.      Home Medications    Prior to Admission medications   Medication Sig Start Date End Date Taking? Authorizing Provider  ALPRAZolam Duanne Moron) 0.5 MG tablet TAKE 1 TABLET BY MOUTH TWICE DAILY AS NEEDED FOR ANXIETY; MUST LAST 30 DAYS 12/21/18   [place], [name], MD  amiodarone (PACERONE) 200 MG tablet Take 1/2 tablet (100 mg) by mouth once daily Patient taking differently: Take 100 mg by mouth  daily. Take 1/2 tablet (100 mg) by mouth once daily 12/10/17   Evans Lance, MD  aspirin EC 81 MG tablet Take 81 mg by mouth daily.    [provider]  atorvastatin (LIPITOR) 80 MG tablet TAKE 1 TABLET(80 MG) BY MOUTH DAILY 09/09/18   [place], [name], MD  Cholecalciferol (VITAMIN D) 2000 units CAPS Take 2,000 Units by mouth daily.    [provider]  cycloSPORINE (RESTASIS) 0.05 % ophthalmic emulsion Place 1 drop into both eyes 2 (two) times daily.    [provider]  DEXILANT 30 MG capsule TAKE 1 CAPSULE(30 MG) BY MOUTH DAILY 03/06/18   [place], [name], MD  doxepin (SINEQUAN) 10 MG capsule TAKE 2 CAPSULES(20 MG) BY MOUTH AT BEDTIME 07/10/18   [place], [name], MD  DULoxetine (CYMBALTA) 60 MG capsule Take 1 capsule (60 mg total) by mouth daily. 10/06/18   [place], [name], MD  furosemide (LASIX) 20 MG tablet 1 tab 3 times per day. 11/17/18   [place], [name], MD  ibuprofen (ADVIL,MOTRIN) 200 MG tablet Take 400-600 mg by mouth every 8 (eight) hours as needed for headache or mild pain (2-3 tablets depending on pain).    [provider]  LINZESS 290 MCG CAPS capsule TAKE 1 CAPSULE(290 MCG) BY MOUTH DAILY BEFORE BREAKFAST 12/29/18   [place], [name], MD  Multiple Vitamin (MULTIVITAMIN) tablet Take 1 tablet by mouth daily.    [provider]  nitroGLYCERIN (NITROSTAT) 0.4 MG SL tablet Place 1 tablet (0.4 mg total) under the tongue every 5 (five) minutes as needed for chest pain. Reported on 06/12/2016 10/22/18   [place], [name], MD  potassium chloride (K-DUR) 10 MEQ tablet TAKE 1 TABLET(10 MEQ) BY MOUTH DAILY 08/14/18   [place], [name], MD  potassium chloride (K-DUR) 10 MEQ tablet TAKE 1 TABLET(10 MEQ) BY MOUTH DAILY 11/30/18   [place], [name], MD  rOPINIRole (REQUIP) 0.25 MG tablet Take 2 tablets (0.5 mg total) by mouth at bedtime. 11/24/18   [place], [name], MD  traMADol (ULTRAM) 50 MG tablet Take 1 tablet (50 mg total) by mouth every 6 (six) hours as needed. 11/19/18    Kirsteins, Luanna Salk, MD  TRELEGY ELLIPTA 100-62.5-25 MCG/INH AEPB INHALE 1 PUFF INTO THE LUNGS DAILY 12/29/18   Collene Gobble, MD  VENTOLIN HFA 108 (90 Base) MCG/ACT inhaler INHALE 2 PUFFS INTO THE LUNGS EVERY 6 HOURS AS NEEDED FOR WHEEZING OR SHORTNESS OF BREATH 09/19/17   [place], [name], MD    Family History Family History  Problem Relation Age of Onset  . Breast cancer Sister 57  . Heart disease Sister   . Heart attack Father   . Heart disease Father   . Stroke Mother   . Heart disease Mother   . Hyperlipidemia Mother   . Heart attack Sister   . Heart disease Brother        ?heart failure  . Congestive Heart Failure Brother   . Hypertension Son   .  Heart disease Maternal Grandfather   . Cancer Cousin        lymphoma  . Hypertension Son   . Stomach cancer Other   . Colon cancer Neg Hx     Social History Social History   Tobacco Use  . Smoking status: Current Every Day Smoker    Packs/day: 1.50    Years: 45.00    Pack years: 67.50    Types: Cigarettes    Start date: 06/14/1966  . Smokeless tobacco: Never Used  . Tobacco comment: 1-2 packs daily  Substance Use Topics  . Alcohol use: No    Alcohol/week: 0.0 standard drinks  . Drug use: No     Allergies   Patient has no known allergies.   Review of Systems Review of Systems  All other systems reviewed and are negative.    Physical Exam Updated Vital Signs BP 123/67   Pulse 82   Temp 98.2 F (36.8 C) (Oral)   Resp (!) 22   Ht 5\' 2"  (1.575 m)   Wt 73 kg   SpO2 91%   BMI 29.45 kg/m   Physical Exam Vitals signs and nursing note reviewed.  Constitutional:      General: She is not in acute distress.    Appearance: She is well-developed.  HENT:     Head: Normocephalic and atraumatic.  Neck:     Musculoskeletal: Normal range of motion.  Cardiovascular:     Rate and Rhythm: Normal rate and regular rhythm.     Heart sounds: Normal heart sounds.  Pulmonary:     Effort: Pulmonary effort is normal.      Breath sounds: Normal breath sounds.  Abdominal:     General: There is no distension.     Palpations: Abdomen is soft.     Tenderness: There is abdominal tenderness in the left lower quadrant.  Musculoskeletal: Normal range of motion.  Skin:    General: Skin is warm and dry.  Neurological:     Mental Status: She is alert and oriented to person, place, and time.  Psychiatric:        Judgment: Judgment normal.      ED Treatments / Results  Labs (all labs ordered are listed, but only abnormal results are displayed) Labs Reviewed  CBC WITH DIFFERENTIAL/PLATELET - Abnormal; Notable for the following components:      Result Value   WBC 22.4 (*)    Hemoglobin 15.2 (*)    HCT 47.6 (*)    Neutro Abs 19.8 (*)    Monocytes Absolute 1.1 (*)    Abs Immature Granulocytes 0.15 (*)    All other components within normal limits  COMPREHENSIVE METABOLIC PANEL - Abnormal; Notable for the following components:   Potassium 3.2 (*)    Glucose, Bld 140 (*)    Total Protein 8.3 (*)    All other components within normal limits  URINALYSIS, ROUTINE W REFLEX MICROSCOPIC - Abnormal; Notable for the following components:   APPearance CLOUDY (*)    Specific Gravity, Urine >1.030 (*)    Bilirubin Urine MODERATE (*)    Ketones, ur 15 (*)    Protein, ur 30 (*)    All other components within normal limits  URINALYSIS, MICROSCOPIC (REFLEX) - Abnormal; Notable for the following components:   Bacteria, UA RARE (*)    Non Squamous Epithelial PRESENT (*)    All other components within normal limits  URINE CULTURE  LIPASE, BLOOD    EKG None  Radiology Ct  Abdomen Pelvis W Contrast  Result Date: 01/01/2019 CLINICAL DATA:  Left lower quadrant abdominal pain, nausea, vomiting and diarrhea. EXAM: CT ABDOMEN AND PELVIS WITH CONTRAST TECHNIQUE: Multidetector CT imaging of the abdomen and pelvis was performed using the standard protocol following bolus administration of intravenous contrast. CONTRAST:   152mL ISOVUE-300 IOPAMIDOL (ISOVUE-300) INJECTION 61% COMPARISON:  08/18/2014 FINDINGS: Lower chest: Mild cardiomegaly with coronary arteriosclerosis. No pericardial effusion or thickening. Atelectasis is noted at each lung base. No effusion or pneumothorax. Hepatobiliary: Status post cholecystectomy. No space-occupying mass of the liver. Slight intrahepatic ductal dilatation consistent with reservoir effect status post cholecystectomy. Post Pancreas: Normal Spleen: Normal Adrenals/Urinary Tract: Normal bilateral adrenal glands and kidneys. Hydroureteronephrosis. The urinary bladder is decompressed and unremarkable. Stomach/Bowel: Transmural thickening of the colon from distal transverse rectum with mucosal enhancement compatible with changes of colitis. Scattered colonic diverticulosis is noted along the sigmoid colon. Proximal to the area of inflammatory thickening are fluid-filled large bowel. No significant small bowel dilatation is noted. Status post appendectomy. Small hiatal hernia. The stomach is unremarkable. Vascular/Lymphatic: Moderate aortoiliac and branch vessel atherosclerosis without aneurysm. No lymphadenopathy. Reproductive: Uterus and bilateral adnexa are unremarkable. Other: Tiny periumbilical fat containing hernia. No abdominopelvic ascites. Small amount subcutaneous edema overlying the lower lumbar spine and sacrum. Musculoskeletal: Schmorl's node of the superior endplate of L4 with slight depression. 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 No aggressive osseous lesions. IMPRESSION: 1. Transmural thickening of the colon from distal transverse colon through rectum with mucosal enhancement compatible with colitis. No bowel obstruction or perforation. 2. Status post cholecystectomy and appendectomy. 3. Aortoiliac and branch vessel atherosclerosis without aneurysm. 4. 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 Electronically Signed   By: Ashley Royalty M.D.   On: 01/01/2019 22:59     Procedures Procedures (including critical care time)  Medications Ordered in ED Medications  iopamidol (ISOVUE-300) 61 % injection 100 mL (has no administration in time range)  iopamidol (ISOVUE-300) 61 % injection (has no administration in time range)  sodium chloride (PF) 0.9 % injection (has no administration in time range)  sodium chloride 0.9 % bolus 1,000 mL (1,000 mLs Intravenous New Bag/Given 01/01/19 2155)  ondansetron (ZOFRAN) injection 4 mg (4 mg Intravenous Given 01/01/19 2155)  morphine 4 MG/ML injection 4 mg (4 mg Intravenous Given 01/01/19 2157)     Initial Impression / Assessment and Plan / ED Course  I have reviewed the triage vital signs and the nursing notes.  Pertinent labs & imaging results that were available during my care of the patient were reviewed by me and considered in my medical decision making (see chart for details).     Left lower quadrant tenderness.  CT scan pending at this time.  Suspect diverticulitis.  IV hydration.  Zofran.  Morphine.  11:16 PM Still with pain at this time.  Significant segment of colitis.  Elevated white blood cell count.  As she is continued to have pain patient will be admitted to hospital at least for observation overnight and symptom control.  Final Clinical Impressions(s) / ED Diagnoses   Final diagnoses:  Acute colitis  Leukocytosis, unspecified type  Acute abdominal pain    ED Discharge Orders    None       Jola Schmidt, MD 01/01/19 2317

## 2019-01-01 NOTE — ED Notes (Signed)
Pt's bladder was scanned 3 times. First scan was 53mL, second was 30mL, and third was 43mL.

## 2019-01-01 NOTE — ED Notes (Signed)
Patient had only 10 cc of urine when cath.

## 2019-01-01 NOTE — H&P (Signed)
Tonya Bradley XLK:[id number] DOB: 1951-06-01 DOA: 01/01/2019     PCP: Martinique, [name], MD   Outpatient Specialists:    Pulmonary   Tonya Arms NP Tonya Bradley    Patient arrived to ER on 01/01/19 at 2006  Patient coming from: home Lives  With family husband    Chief Complaint:  Chief Complaint  Patient presents with  . Abdominal Pain    HPI: Tonya Bradley is a 68 y.o. female with medical history significant of  COPD,  on continuous oxygen at 2 L during the day and 3 L at night.  Tobacco  Abuse, CAD, GERD 3 of a flutter status post a flutter ablation, irritable bowel syndrome  Presented with nausea vomiting abdominal pain left lower quadrant starting this afternoon associated with   diarrhea developed some blood in the stool.  No associated fevers or chills. No Sick contacts They ate at a restaurant yesterday but both shared the meal]  husband is no sick  She has hx of  occasional bouts of diarrhea but never been this sick  Colonoscopy in April 2015 was done for diarrhea per Dr. Gala Bradley.  She had pandiverticulosis and biopsies were positive for lymphocytic colitis.   Regarding pertinent Chronic problems:  History of COPD on home oxygen still continues to smoke and not interested in quitting History of a flutter status post ablation on amiodarone Aspirin  While in ER: Noted to have decreased amount of urine production Noted to have elevated white blood cell count CT scans showing colitis  The following Work up has been ordered so far:  Orders Placed This Encounter  Procedures  . Urine culture  . CT ABDOMEN PELVIS W CONTRAST  . CBC with Differential/Platelet  . Comprehensive metabolic panel  . Lipase, blood  . Urinalysis, Routine w reflex microscopic  . Urinalysis, Microscopic (reflex)  . Bladder scan  . Consult to hospitalist    Following Medications were ordered in ER: Medications  morphine 4 MG/ML injection 4 mg (has no administration in time range)  sodium  chloride 0.9 % bolus 1,000 mL (1,000 mLs Intravenous New Bag/Given 01/01/19 2155)  ondansetron (ZOFRAN) injection 4 mg (4 mg Intravenous Given 01/01/19 2155)  morphine 4 MG/ML injection 4 mg (4 mg Intravenous Given 01/01/19 2157)  iopamidol (ISOVUE-300) 61 % injection 100 mL (100 mLs Intravenous Contrast Given 01/01/19 2236)  sodium chloride (PF) 0.9 % injection (  Given by Other 01/01/19 2305)    Significant initial  Findings: Abnormal Labs Reviewed  CBC WITH DIFFERENTIAL/PLATELET - Abnormal; Notable for the following components:      Result Value   WBC 22.4 (*)    Hemoglobin 15.2 (*)    HCT 47.6 (*)    Neutro Abs 19.8 (*)    Monocytes Absolute 1.1 (*)    Abs Immature Granulocytes 0.15 (*)    All other components within normal limits  COMPREHENSIVE METABOLIC PANEL - Abnormal; Notable for the following components:   Potassium 3.2 (*)    Glucose, Bld 140 (*)    Total Protein 8.3 (*)    All other components within normal limits  URINALYSIS, ROUTINE W REFLEX MICROSCOPIC - Abnormal; Notable for the following components:   APPearance CLOUDY (*)    Specific Gravity, Urine >1.030 (*)    Bilirubin Urine MODERATE (*)    Ketones, ur 15 (*)    Protein, ur 30 (*)    All other components within normal limits  URINALYSIS, MICROSCOPIC (REFLEX) - Abnormal; Notable for  the following components:   Bacteria, UA RARE (*)    Non Squamous Epithelial PRESENT (*)    All other components within normal limits     Lactic Acid, Venous    Component Value Date/Time   LATICACIDVEN 1.64 03/13/2016 1327    Na 139 K 3.2  Cr    stable,   Lab Results  Component Value Date   CREATININE 0.64 01/01/2019   CREATININE 0.70 10/20/2018   CREATININE 0.66 06/19/2018       HG/HCT Up from baseline see below    Component Value Date/Time   HGB 15.2 (H) 01/01/2019 2108   HGB 12.6 05/12/2017 1142   HCT 47.6 (H) 01/01/2019 2108   HCT 36.6 05/12/2017 1142       Troponin (Point of Care Test) No results for  input(s): TROPIPOC in the last 72 hours.     UA   no evidence of UTI     CTabd/pelvis - colitis  ECG: ordered  ED Triage Vitals  Enc Vitals Group     BP 01/01/19 2038 130/66     Pulse Rate 01/01/19 2038 80     Resp 01/01/19 2038 18     Temp 01/01/19 2038 98.2 F (36.8 C)     Temp Source 01/01/19 2038 Oral     SpO2 01/01/19 2038 90 %     Weight 01/01/19 2038 161 lb (73 kg)     Height 01/01/19 2038 5\' 2"  (1.575 m)     Head Circumference --      Peak Flow --      Pain Score 01/01/19 2106 9     Pain Loc --      Pain Edu? --      Excl. in [place]? --   TMAX(24)@       Latest  Blood pressure 123/67, pulse 82, temperature 98.2 F (36.8 C), temperature source Oral, resp. rate (!) 22, height 5\' 2"  (1.575 m), weight 73 kg, SpO2 91 %.       Hospitalist was called for admission for  Colitis   Review of Systems:    Pertinent positives include: chills, fatigue, abdominal pain, nausea, vomiting, diarrhea,  Constitutional:  No weight loss, night sweats, Fevers, , weight loss  HEENT:  No headaches, Difficulty swallowing,Tooth/dental problems,Sore throat,  No sneezing, itching, ear ache, nasal congestion, post nasal drip,  Cardio-vascular:  No chest pain, Orthopnea, PND, anasarca, dizziness, palpitations.no Bilateral lower extremity swelling  GI:  No heartburn, indigestion,  change in bowel habits, loss of appetite, melena, blood in stool, hematemesis Resp:  no shortness of breath at rest. No dyspnea on exertion, No excess mucus, no productive cough, No non-productive cough, No coughing up of blood.No change in color of mucus.No wheezing. Skin:  no rash or lesions. No jaundice GU:  no dysuria, change in color of urine, no urgency or frequency. No straining to urinate.  No flank pain.  Musculoskeletal:  No joint pain or no joint swelling. No decreased range of motion. No back pain.  Psych:  No change in mood or affect. No depression or anxiety. No memory loss.  Neuro: no  localizing neurological complaints, no tingling, no weakness, no double vision, no gait abnormality, no slurred speech, no confusion  All systems reviewed and apart from [place] all are negative  Past Medical History:   Past Medical History:  Diagnosis Date  . Anemia   . Anxiety   . Arthritis    "back"  . Atrial flutter ([place]) 03/13/2016  .  CAD (coronary artery disease)   . Cancer ([place])    skin cancer  . Cataract   . Colon polyp   . COPD (chronic obstructive pulmonary disease) ([place])   . Depression   . Dysrhythmia   . Emphysema of lung ([place])   . GERD (gastroesophageal reflux disease)   . Hypercholesterolemia   . Osteoporosis   . Oxygen deficiency       Past Surgical History:  Procedure Laterality Date  . A-FLUTTER ABLATION N/A 05/19/2017   Procedure: A-Flutter Ablation;  Surgeon: Evans Lance, MD;  Location: [facility];  Service: Cardiovascular;  Laterality: N/A;  . APPENDECTOMY    . BASAL CELL CARCINOMA EXCISION    . CARDIAC CATHETERIZATION    . CATARACT EXTRACTION Bilateral   . CHOLECYSTECTOMY    . COLONOSCOPY N/A 06/23/2014   Dr. Rourk:Rectal and colonic polyps-removed, Pancolonic diverticulosis. lymphocytic colitis  . CORONARY ANGIOPLASTY WITH STENT PLACEMENT    . ESOPHAGEAL DILATION N/A 03/13/2016   Procedure: ESOPHAGEAL DILATION;  Surgeon: Daneil Dolin, MD;  [facility]: [facility];  Service: Endoscopy;  Laterality: N/A;  . ESOPHAGOGASTRODUODENOSCOPY N/A 03/13/2016   Procedure: ESOPHAGOGASTRODUODENOSCOPY (EGD);  Surgeon: Daneil Dolin, MD;  Location: [facility];  Service: Endoscopy;  Laterality: N/A;  0930  . EYE SURGERY     cataract removed, bilateral  . LUMBAR LAMINECTOMY/DECOMPRESSION MICRODISCECTOMY N/A 01/19/2018   Procedure: L4-5 DECOMPRESSION;  Surgeon: Marybelle Killings, MD;  Location: [facility];  Service: Orthopedics;  Laterality: N/A;    Social History:  Ambulatory   Independently     reports that she has been smoking cigarettes. She started  smoking about 52 years ago. She has a 67.50 pack-year smoking history. She has never used smokeless tobacco. She reports that she does not drink alcohol or use drugs.     Family History:   Family History  Problem Relation Age of Onset  . Breast cancer Sister 59  . Heart disease Sister   . Heart attack Father   . Heart disease Father   . Stroke Mother   . Heart disease Mother   . Hyperlipidemia Mother   . Heart attack Sister   . Heart disease Brother        ?heart failure  . Congestive Heart Failure Brother   . Hypertension Son   . Heart disease Maternal Grandfather   . Cancer Cousin        lymphoma  . Hypertension Son   . Stomach cancer Other   . Colon cancer Neg Hx     Allergies: No Known Allergies   Prior to Admission medications   Medication Sig Start Date End Date Taking? Authorizing Provider  ALPRAZolam Duanne Moron) 0.5 MG tablet TAKE 1 TABLET BY MOUTH TWICE DAILY AS NEEDED FOR ANXIETY; MUST LAST 30 DAYS 12/21/18   Martinique, [name], MD  amiodarone (PACERONE) 200 MG tablet Take 1/2 tablet (100 mg) by mouth once daily Patient taking differently: Take 100 mg by mouth daily. Take 1/2 tablet (100 mg) by mouth once daily 12/10/17   Evans Lance, MD  aspirin EC 81 MG tablet Take 81 mg by mouth daily.    [provider]  atorvastatin (LIPITOR) 80 MG tablet TAKE 1 TABLET(80 MG) BY MOUTH DAILY 09/09/18   Martinique, [name], MD  Cholecalciferol (VITAMIN D) 2000 units CAPS Take 2,000 Units by mouth daily.    [provider]  cycloSPORINE (RESTASIS) 0.05 % ophthalmic emulsion Place 1 drop into both eyes 2 (  two) times daily.    [provider]  DEXILANT 30 MG capsule TAKE 1 CAPSULE(30 MG) BY MOUTH DAILY 03/06/18   Martinique, [name], MD  doxepin (SINEQUAN) 10 MG capsule TAKE 2 CAPSULES(20 MG) BY MOUTH AT BEDTIME 07/10/18   Martinique, [name], MD  DULoxetine (CYMBALTA) 60 MG capsule Take 1 capsule (60 mg total) by mouth daily. 10/06/18   Martinique, [name], MD  furosemide (LASIX)  20 MG tablet 1 tab 3 times per day. 11/17/18   Martinique, [name], MD  ibuprofen (ADVIL,MOTRIN) 200 MG tablet Take 400-600 mg by mouth every 8 (eight) hours as needed for headache or mild pain (2-3 tablets depending on pain).    [provider]  LINZESS 290 MCG CAPS capsule TAKE 1 CAPSULE(290 MCG) BY MOUTH DAILY BEFORE BREAKFAST 12/29/18   Martinique, [name], MD  Multiple Vitamin (MULTIVITAMIN) tablet Take 1 tablet by mouth daily.    [provider]  nitroGLYCERIN (NITROSTAT) 0.4 MG SL tablet Place 1 tablet (0.4 mg total) under the tongue every 5 (five) minutes as needed for chest pain. Reported on 06/12/2016 10/22/18   Martinique, [name], MD  potassium chloride (K-DUR) 10 MEQ tablet TAKE 1 TABLET(10 MEQ) BY MOUTH DAILY 08/14/18   Martinique, [name], MD  potassium chloride (K-DUR) 10 MEQ tablet TAKE 1 TABLET(10 MEQ) BY MOUTH DAILY 11/30/18   Martinique, [name], MD  rOPINIRole (REQUIP) 0.25 MG tablet Take 2 tablets (0.5 mg total) by mouth at bedtime. 11/24/18   Martinique, [name], MD  traMADol (ULTRAM) 50 MG tablet Take 1 tablet (50 mg total) by mouth every 6 (six) hours as needed. 11/19/18   Kirsteins, Luanna Salk, MD  TRELEGY ELLIPTA 100-62.5-25 MCG/INH AEPB INHALE 1 PUFF INTO THE LUNGS DAILY 12/29/18   Collene Gobble, MD  VENTOLIN HFA 108 (90 Base) MCG/ACT inhaler INHALE 2 PUFFS INTO THE LUNGS EVERY 6 HOURS AS NEEDED FOR WHEEZING OR SHORTNESS OF BREATH 09/19/17   Martinique, [name], MD   Physical Exam: Blood pressure 123/67, pulse 82, temperature 98.2 F (36.8 C), temperature source Oral, resp. rate (!) 22, height 5\' 2"  (1.575 m), weight 73 kg, SpO2 91 %. 1. General:  in No Acute distress   Chronically ill -appearing 2. Psychological: Alert and  Oriented 3. Head/ENT:    Dry Mucous Membranes                          Head Non traumatic, neck supple                            Poor Dentition 4. SKIN:  decreased Skin turgor,  Skin clean Dry and intact no rash 5. Heart: Regular rate and rhythm no Murmur, no  Rub or gallop 6. Lungs: no wheezes or crackles   7. Abdomen: Soft, LLQ tender,  distended  obese  bowel sounds present 8. Lower extremities: no clubbing, cyanosis, no  edema 9. Neurologically Grossly intact, moving all 4 extremities equally   10. MSK: Normal range of motion   LABS:     Recent Labs  Lab 01/01/19 2108  WBC 22.4*  NEUTROABS 19.8*  HGB 15.2*  HCT 47.6*  MCV 95.4  PLT 588   Basic Metabolic Panel: Recent Labs  Lab 01/01/19 2108  NA 139  K 3.2*  CL 101  CO2 26  GLUCOSE 140*  BUN 20  CREATININE 0.64  CALCIUM 9.8  Recent Labs  Lab 01/01/19 2108  AST 27  ALT 26  ALKPHOS 120  BILITOT 0.6  PROT 8.3*  ALBUMIN 4.4   Recent Labs  Lab 01/01/19 2108  LIPASE 35   No results for input(s): AMMONIA in the last 168 hours.    HbA1C: No results for input(s): HGBA1C in the last 72 hours. CBG: No results for input(s): GLUCAP in the last 168 hours.    Urine analysis:    Component Value Date/Time   COLORURINE YELLOW 01/01/2019 2112   APPEARANCEUR CLOUDY (A) 01/01/2019 2112   LABSPEC >1.030 (H) 01/01/2019 2112   PHURINE 5.0 01/01/2019 2112   GLUCOSEU NEGATIVE 01/01/2019 2112   GLUCOSEU NEGATIVE 08/04/2018 1721   HGBUR NEGATIVE 01/01/2019 2112   BILIRUBINUR MODERATE (A) 01/01/2019 2112   KETONESUR 15 (A) 01/01/2019 2112   PROTEINUR 30 (A) 01/01/2019 2112   UROBILINOGEN 0.2 08/04/2018 1721   NITRITE NEGATIVE 01/01/2019 2112   LEUKOCYTESUR NEGATIVE 01/01/2019 2112      Cultures: No results found for: SDES, SPECREQUEST, CULT, REPTSTATUS   Radiological Exams on Admission: Ct Abdomen Pelvis W Contrast  Result Date: 01/01/2019 CLINICAL DATA:  Left lower quadrant abdominal pain, nausea, vomiting and diarrhea. EXAM: CT ABDOMEN AND PELVIS WITH CONTRAST TECHNIQUE: Multidetector CT imaging of the abdomen and pelvis was performed using the standard protocol following bolus administration of intravenous contrast. CONTRAST:  174mL ISOVUE-300 IOPAMIDOL  (ISOVUE-300) INJECTION 61% COMPARISON:  08/18/2014 FINDINGS: Lower chest: Mild cardiomegaly with coronary arteriosclerosis. No pericardial effusion or thickening. Atelectasis is noted at each lung base. No effusion or pneumothorax. Hepatobiliary: Status post cholecystectomy. No space-occupying mass of the liver. Slight intrahepatic ductal dilatation consistent with reservoir effect status post cholecystectomy. Post Pancreas: Normal Spleen: Normal Adrenals/Urinary Tract: Normal bilateral adrenal glands and kidneys. Hydroureteronephrosis. The urinary bladder is decompressed and unremarkable. Stomach/Bowel: Transmural thickening of the colon from distal transverse rectum with mucosal enhancement compatible with changes of colitis. Scattered colonic diverticulosis is noted along the sigmoid colon. Proximal to the area of inflammatory thickening are fluid-filled large bowel. No significant small bowel dilatation is noted. Status post appendectomy. Small hiatal hernia. The stomach is unremarkable. Vascular/Lymphatic: Moderate aortoiliac and branch vessel atherosclerosis without aneurysm. No lymphadenopathy. Reproductive: Uterus and bilateral adnexa are unremarkable. Other: Tiny periumbilical fat containing hernia. No abdominopelvic ascites. Small amount subcutaneous edema overlying the lower lumbar spine and sacrum. Musculoskeletal: Schmorl's node of the superior endplate of L4 with slight depression. 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 No aggressive osseous lesions. IMPRESSION: 1. Transmural thickening of the colon from distal transverse colon through rectum with mucosal enhancement compatible with colitis. No bowel obstruction or perforation. 2. Status post cholecystectomy and appendectomy. 3. Aortoiliac and branch vessel atherosclerosis without aneurysm. 4. 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 Electronically Signed   By: Ashley Royalty M.D.   On: 01/01/2019 22:59    Chart has been  reviewed    Assessment/Plan  68 y.o. female with medical history significant of  COPD,  on continuous oxygen at 2 L during the day and 3 L at night.  Tobacco  Abuse, CAD, GERD 3 of a flutter status post a flutter ablation, irritable bowel syndrome Admitted for colitis  Present on Admission: . Colitis -order gastric panel this was sudden in onset we will hold off on C. difficile testing at this point.  Cannot rule out ischemic colitis.  Given significant elevated white blood cell count and risk factors will initiate antibiotics Given history of lymphocytic colitis in the past would  consult GI if no significant improvement  . Chronic hypoxemic respiratory failure (HCC) continue oxygen currently appears to be at baseline . Chronic pain disorder continue home medications . COPD (chronic obstructive pulmonary disease) ([place]) while hospitalized will give albuterol as needed and Dulera continue to monitor currently stable . GERD (gastroesophageal reflux disease) stable continue home medications and states that in the past she have to have a test throat stretched but currently doing okay . Dehydration we will rehydrate and follow . Hypokalemia replace and check magnesium level Tobacco abuse -  - Spoke about importance of quitting spent 5 minutes discussing options for treatment, prior attempts at quitting, and dangers of smoking  -At this point patient is   NOT  interested in quitting  - order nicotine patch   - nursing tobacco cessation protocol  Hx of a. Flutter -currently appears to be in sinus but will obtain EKG to document continue amiodarone hold aspirin given blood in stool states she is no longer on anticoagulation   Other plan as per orders.  DVT prophylaxis:  SCD    Code Status: DNR/DNI   as per patient   I had personally discussed CODE STATUS with patient and family   Family Communication:   Family at  Bedside  plan of care was discussed with  Husband,    Disposition Plan:      To home once workup is complete and patient is stable                                        Consults called: none  Admission status:    inpatient     Expect 2 midnight stay secondary to severity of patient's current illness including     Severe lab/radiological/exam abnormalities including:  Colitis and luekocytosis   and extensive comorbidities including:  Chronic pain   CAD .   COPD    That are currently affecting medical management.   I expect  patient to be hospitalized for 2 midnights requiring inpatient medical care.  Patient is at high risk for adverse outcome (such as loss of life or disability) if not treated.  Indication for inpatient stay as follows:    severe pain requiring acute inpatient management,  inability to maintain oral hydration    Need for IV antibiotics, IV fluids       Level of care     tele  For 12H       Tisheena Maguire 01/02/2019, 12:11 AM    [facility]     after 2 AM please page floor coverage PA If 7AM-7PM, please contact the day team taking care of the patient using [email or web address]

## 2019-01-01 NOTE — ED Notes (Signed)
Bed: WA06 Expected date:  Expected time:  Means of arrival:  Comments: EMS [phone] female abdominal pain since1600/diarrhea and vomiting-no blood

## 2019-01-01 NOTE — ED Triage Notes (Signed)
Per EMS, patient from home, c/o abdominal pain with N/V/D since 1600. Denies urinary sx. Ambulatory.

## 2019-01-02 LAB — COMPREHENSIVE METABOLIC PANEL
ALT: 18 U/L (ref 0–44)
AST: 15 U/L (ref 15–41)
Albumin: 3.2 g/dL — ABNORMAL LOW (ref 3.5–5.0)
Alkaline Phosphatase: 77 U/L (ref 38–126)
Anion gap: 5 (ref 5–15)
BUN: 15 mg/dL (ref 8–23)
CO2: 27 mmol/L (ref 22–32)
Calcium: 7.9 mg/dL — ABNORMAL LOW (ref 8.9–10.3)
Chloride: 108 mmol/L (ref 98–111)
Creatinine, Ser: 0.51 mg/dL (ref 0.44–1.00)
GFR calc Af Amer: >60 mL/min (ref >60)
GFR calc non Af Amer: >60 mL/min (ref >60)
Glucose, Bld: 100 mg/dL — ABNORMAL HIGH (ref 70–99)
POTASSIUM: 3.2 mmol/L — AB (ref 3.5–5.1)
Sodium: 140 mmol/L (ref 135–145)
Total Bilirubin: 0.8 mg/dL (ref 0.3–1.2)
Total Protein: 5.7 g/dL — ABNORMAL LOW (ref 6.5–8.1)

## 2019-01-02 LAB — CBC
HCT: 36.1 % (ref 36.0–46.0)
HEMOGLOBIN: 11.5 g/dL — AB (ref 12.0–15.0)
MCH: 30.7 pg (ref 26.0–34.0)
MCHC: 31.9 g/dL (ref 30.0–36.0)
MCV: 96.3 fL (ref 80.0–100.0)
Platelets: 232 10*3/uL (ref 150–400)
RBC: 3.75 MIL/uL — ABNORMAL LOW (ref 3.87–5.11)
RDW: 13.6 % (ref 11.5–15.5)
WBC: 13.8 10*3/uL — ABNORMAL HIGH (ref 4.0–10.5)
nRBC: 0 % (ref 0.0–0.2)

## 2019-01-02 LAB — MAGNESIUM
MAGNESIUM: 2.3 mg/dL (ref 1.7–2.4)
Magnesium: 2 mg/dL (ref 1.7–2.4)

## 2019-01-02 LAB — GASTROINTESTINAL PANEL BY PCR, STOOL (REPLACES STOOL CULTURE)
ASTROVIRUS: NOT DETECTED
Adenovirus F40/41: NOT DETECTED
Campylobacter species: DETECTED — AB
Cryptosporidium: NOT DETECTED
Cyclospora cayetanensis: NOT DETECTED
ENTEROTOXIGENIC E COLI (ETEC): NOT DETECTED
Entamoeba histolytica: NOT DETECTED
Enteroaggregative E coli (EAEC): NOT DETECTED
Enteropathogenic E coli (EPEC): DETECTED — AB
Giardia lamblia: NOT DETECTED
Norovirus GI/GII: NOT DETECTED
Plesimonas shigelloides: NOT DETECTED
Rotavirus A: NOT DETECTED
Salmonella species: NOT DETECTED
Sapovirus (I, II, IV, and V): NOT DETECTED
Shiga like toxin producing E coli (STEC): NOT DETECTED
Shigella/Enteroinvasive E coli (EIEC): NOT DETECTED
Vibrio cholerae: NOT DETECTED
Vibrio species: NOT DETECTED
Yersinia enterocolitica: NOT DETECTED

## 2019-01-02 LAB — TSH: TSH: 0.039 u[IU]/mL — ABNORMAL LOW (ref 0.350–4.500)

## 2019-01-02 LAB — PHOSPHORUS
Phosphorus: 4.2 mg/dL (ref 2.5–4.6)
Phosphorus: 4.2 mg/dL (ref 2.5–4.6)

## 2019-01-02 LAB — HIV ANTIBODY (ROUTINE TESTING W REFLEX): HIV Screen 4th Generation wRfx: NONREACTIVE

## 2019-01-02 LAB — LACTIC ACID, PLASMA: Lactic Acid, Venous: 0.6 mmol/L (ref 0.5–1.9)

## 2019-01-02 MED ORDER — MORPHINE SULFATE (PF) 2 MG/ML IV SOLN
2.0000 mg | INTRAVENOUS | Status: DC | PRN
  Administered 2019-01-02: 3 mg via INTRAVENOUS
  Administered 2019-01-02 – 2019-01-03 (×6): 2 mg via INTRAVENOUS
  Filled 2019-01-02 (×3): qty 1
  Filled 2019-01-02: qty 2
  Filled 2019-01-02 (×3): qty 1

## 2019-01-02 MED ORDER — AMIODARONE HCL 100 MG PO TABS
100.0000 mg | ORAL_TABLET | Freq: Every day | ORAL | Status: DC
  Administered 2019-01-02 – 2019-01-03 (×2): 100 mg via ORAL
  Filled 2019-01-02 (×2): qty 1

## 2019-01-02 MED ORDER — CYCLOSPORINE 0.05 % OP EMUL
1.0000 [drp] | Freq: Two times a day (BID) | OPHTHALMIC | Status: DC
  Administered 2019-01-02: 1 [drp] via OPHTHALMIC
  Filled 2019-01-02 (×6): qty 1

## 2019-01-02 MED ORDER — ONDANSETRON HCL 4 MG PO TABS
4.0000 mg | ORAL_TABLET | Freq: Four times a day (QID) | ORAL | Status: DC | PRN
  Administered 2019-01-03: 4 mg via ORAL
  Filled 2019-01-02: qty 1

## 2019-01-02 MED ORDER — ALBUTEROL SULFATE (2.5 MG/3ML) 0.083% IN NEBU: 2.5000 mg | INHALATION_SOLUTION | RESPIRATORY_TRACT | Status: DC | PRN

## 2019-01-02 MED ORDER — POTASSIUM CHLORIDE CRYS ER 20 MEQ PO TBCR
40.0000 meq | EXTENDED_RELEASE_TABLET | ORAL | Status: AC
  Administered 2019-01-02 (×2): 40 meq via ORAL
  Filled 2019-01-02 (×2): qty 2

## 2019-01-02 MED ORDER — DULOXETINE HCL 60 MG PO CPEP
60.0000 mg | ORAL_CAPSULE | Freq: Every day | ORAL | Status: DC
  Administered 2019-01-02 – 2019-01-03 (×2): 60 mg via ORAL
  Filled 2019-01-02: qty 1
  Filled 2019-01-02: qty 2

## 2019-01-02 MED ORDER — AZITHROMYCIN 250 MG PO TABS
500.0000 mg | ORAL_TABLET | Freq: Every day | ORAL | Status: DC
  Administered 2019-01-02 – 2019-01-03 (×2): 500 mg via ORAL
  Filled 2019-01-02 (×2): qty 2

## 2019-01-02 MED ORDER — ROPINIROLE HCL 1 MG PO TABS
0.5000 mg | ORAL_TABLET | Freq: Every day | ORAL | Status: DC
  Administered 2019-01-02: 0.5 mg via ORAL
  Filled 2019-01-02: qty 1

## 2019-01-02 MED ORDER — GUAIFENESIN ER 600 MG PO TB12
600.0000 mg | ORAL_TABLET | Freq: Two times a day (BID) | ORAL | Status: DC
  Administered 2019-01-02 – 2019-01-03 (×3): 600 mg via ORAL
  Filled 2019-01-02 (×3): qty 1

## 2019-01-02 MED ORDER — PANTOPRAZOLE SODIUM 40 MG IV SOLR
40.0000 mg | Freq: Every day | INTRAVENOUS | Status: DC
  Administered 2019-01-02 (×2): 40 mg via INTRAVENOUS
  Filled 2019-01-02 (×2): qty 40

## 2019-01-02 MED ORDER — SODIUM CHLORIDE 0.9 % IV SOLN
INTRAVENOUS | Status: DC
  Administered 2019-01-03: 11:00:00 via INTRAVENOUS

## 2019-01-02 MED ORDER — ACETAMINOPHEN 325 MG PO TABS: 650.0000 mg | ORAL_TABLET | Freq: Four times a day (QID) | ORAL | Status: DC | PRN

## 2019-01-02 MED ORDER — MOMETASONE FURO-FORMOTEROL FUM 100-5 MCG/ACT IN AERO
2.0000 | INHALATION_SPRAY | Freq: Two times a day (BID) | RESPIRATORY_TRACT | Status: DC
  Administered 2019-01-02 – 2019-01-03 (×2): 2 via RESPIRATORY_TRACT
  Filled 2019-01-02: qty 8.8

## 2019-01-02 MED ORDER — ATORVASTATIN CALCIUM 40 MG PO TABS
80.0000 mg | ORAL_TABLET | Freq: Every day | ORAL | Status: DC
  Administered 2019-01-02 – 2019-01-03 (×2): 80 mg via ORAL
  Filled 2019-01-02 (×2): qty 2

## 2019-01-02 MED ORDER — ORAL CARE MOUTH RINSE
15.0000 mL | Freq: Two times a day (BID) | OROMUCOSAL | Status: DC
  Administered 2019-01-03: 15 mL via OROMUCOSAL

## 2019-01-02 MED ORDER — TRAMADOL HCL 50 MG PO TABS
50.0000 mg | ORAL_TABLET | Freq: Four times a day (QID) | ORAL | Status: DC | PRN
  Administered 2019-01-02: 50 mg via ORAL
  Filled 2019-01-02: qty 1

## 2019-01-02 MED ORDER — ONDANSETRON HCL 4 MG/2ML IJ SOLN: 4.0000 mg | Freq: Four times a day (QID) | INTRAMUSCULAR | Status: DC | PRN

## 2019-01-02 MED ORDER — PIPERACILLIN-TAZOBACTAM 3.375 G IVPB 30 MIN
3.3750 g | Freq: Once | INTRAVENOUS | Status: AC
  Administered 2019-01-02: 3.375 g via INTRAVENOUS
  Filled 2019-01-02: qty 50

## 2019-01-02 MED ORDER — SODIUM CHLORIDE 0.9 % IV SOLN
INTRAVENOUS | Status: DC | PRN
  Administered 2019-01-02: 250 mL via INTRAVENOUS

## 2019-01-02 MED ORDER — SODIUM CHLORIDE 0.9 % IV SOLN
INTRAVENOUS | Status: AC
  Administered 2019-01-02: 75 mL/h via INTRAVENOUS

## 2019-01-02 MED ORDER — ALPRAZOLAM 0.5 MG PO TABS
0.5000 mg | ORAL_TABLET | Freq: Every evening | ORAL | Status: DC | PRN
  Administered 2019-01-02: 0.5 mg via ORAL
  Filled 2019-01-02: qty 1

## 2019-01-02 MED ORDER — ACETAMINOPHEN 650 MG RE SUPP: 650.0000 mg | Freq: Four times a day (QID) | RECTAL | Status: DC | PRN

## 2019-01-02 MED ORDER — PIPERACILLIN-TAZOBACTAM 3.375 G IVPB
3.3750 g | Freq: Three times a day (TID) | INTRAVENOUS | Status: DC
  Administered 2019-01-02 – 2019-01-03 (×4): 3.375 g via INTRAVENOUS
  Filled 2019-01-02 (×3): qty 50

## 2019-01-02 NOTE — Progress Notes (Signed)
CRITICAL VALUE ALERT  Critical Value:  GI Panel positive for Campylobacter and Enteropathogenic Ecoli  Date & Time Notied:  01/02/2019 at 1724  Provider Notified: Dr. Marthenia Rolling via text page   Orders Received/Actions taken: See new orders

## 2019-01-02 NOTE — Progress Notes (Signed)
PROGRESS NOTE    Tonya Bradley  STM:[id number] DOB: 1951-11-12 DOA: 01/01/2019 PCP: Martinique, [name], MD  Outpatient Specialists:   Brief Narrative:  Tonya Bradley is a 68 y.o. female with past medical history significant for COPD on continuous oxygen 2 L/min during the day and 3 L per minute at night, tobacco abuse, CAD, GERD, atrial flutter status post flutter ablation and irritable bowel syndrome.  Patient presented with chills, abdominal cramps, nausea and vomiting and bloody diarrhea.  T scan of the abdomen and pelvis revealed colitis involving distal transverse colon to the rectum.  Significant leukocytosis is noted.  Patient is currently on IV antibiotics.  Lactic acid level is 0.6.  Assessment & Plan:   Active Problems:   Chronic hypoxemic respiratory failure (HCC)   Chronic pain disorder   COPD (chronic obstructive pulmonary disease) (HCC)   GERD (gastroesophageal reflux disease)   Colitis   Dehydration   Hypokalemia  Colitis, likely infectious colitis: -Ischemic colitis remains a possibility.  However, lactic acid level is only 0.6. -Patient symptoms have improved significantly with IV antibiotics treatment and IV fluids.   -Continue antibiotics for now.   -Follow pending work-up.   -Further management depend on hospital course.   -History of lymphocytic colitis noted.    Chronic hypoxemic respiratory failure: Stable for now.  Continue current management.   Chronic pain disorder: Stable f or now. Continue home medications  COPD (chronic obstructive pulmonary disease): Stable.   Continue albuterol as needed and Dulera.  Marland Kitchen GERD (gastroesophageal reflux disease) stable continue home medications and states that in the past she have to have a test throat stretched but currently doing okay  Volume depletion:  Resolved significantly Continue IV fluids.   Hypokalemia: Continue to monitor and replace. Monitor magnesium level.  Tobacco abuse: Conseled  Hx of  atrial Flutter: Status post ablation.   DVT prophylaxis:  SCD    Code Status: DNR/DNI    Family Communication:   Husband Disposition Plan:     Home eventually.                                     Consults called: none  Antimicrobials:   IV Zosyn   Subjective: No new complaints Left lower quadrant abdominal pain is improving.   No nausea or vomiting. Diarrhea has improved significantly.  Objective: Vitals:   01/02/19 0630 01/02/19 0700 01/02/19 1201 01/02/19 1248  BP: (!) 106/59 113/62 105/60 (!) 106/51  Pulse: 81 86 82 79  Resp: 19 (!) 27 16 18   Temp:    98.2 F (36.8 C)  TempSrc:    Oral  SpO2: 96% 95% 91% 92%  Weight:      Height:        Intake/Output Summary (Last 24 hours) at 01/02/2019 1542 Last data filed at 01/02/2019 1400 Gross per 24 hour  Intake 2620 ml  Output -  Net 2620 ml   Filed Weights   01/01/19 2038  Weight: 73 kg    Examination:  General exam: Appears calm and comfortable.  Obese. Respiratory system: Clear to auscultation. Cardiovascular system: S1 & S2 heard.  Mild ankle edema.   Gastrointestinal system: Abdomen is morbidly obese, with mild left lower quadrant abdominal tenderness.  Organs are difficult to assess.   Central nervous system: Alert and oriented. No focal neurological deficits. Extremities: Mild ankle edema.  Data Reviewed: I have personally reviewed following  labs and imaging studies  CBC: Recent Labs  Lab 01/01/19 2108 01/02/19 0506  WBC 22.4* 13.8*  NEUTROABS 19.8*  --   HGB 15.2* 11.5*  HCT 47.6* 36.1  MCV 95.4 96.3  PLT 313 308   Basic Metabolic Panel: Recent Labs  Lab 01/01/19 2108 01/02/19 0506  NA 139 140  K 3.2* 3.2*  CL 101 108  CO2 26 27  GLUCOSE 140* 100*  BUN 20 15  CREATININE 0.64 0.51  CALCIUM 9.8 7.9*  MG 2.3 2.0  PHOS 4.2 4.2   GFR: Estimated Creatinine Clearance: 63 mL/min (by C-G formula based on SCr of 0.51 mg/dL). Liver Function Tests: Recent Labs  Lab 01/01/19 2108  01/02/19 0506  AST 27 15  ALT 26 18  ALKPHOS 120 77  BILITOT 0.6 0.8  PROT 8.3* 5.7*  ALBUMIN 4.4 3.2*   Recent Labs  Lab 01/01/19 2108  LIPASE 35   No results for input(s): AMMONIA in the last 168 hours. Coagulation Profile: No results for input(s): INR, PROTIME in the last 168 hours. Cardiac Enzymes: No results for input(s): CKTOTAL, CKMB, CKMBINDEX, TROPONINI in the last 168 hours. BNP (last 3 results) No results for input(s): PROBNP in the last 8760 hours. HbA1C: No results for input(s): HGBA1C in the last 72 hours. CBG: No results for input(s): GLUCAP in the last 168 hours. Lipid Profile: No results for input(s): CHOL, HDL, LDLCALC, TRIG, CHOLHDL, LDLDIRECT in the last 72 hours. Thyroid Function Tests: Recent Labs    01/02/19 0506  TSH 0.039*   Anemia Panel: No results for input(s): VITAMINB12, FOLATE, FERRITIN, TIBC, IRON, RETICCTPCT in the last 72 hours. Urine analysis:    Component Value Date/Time   COLORURINE YELLOW 01/01/2019 2112   APPEARANCEUR CLOUDY (A) 01/01/2019 2112   LABSPEC >1.030 (H) 01/01/2019 2112   PHURINE 5.0 01/01/2019 2112   GLUCOSEU NEGATIVE 01/01/2019 2112   GLUCOSEU NEGATIVE 08/04/2018 1721   HGBUR NEGATIVE 01/01/2019 2112   BILIRUBINUR MODERATE (A) 01/01/2019 2112   KETONESUR 15 (A) 01/01/2019 2112   PROTEINUR 30 (A) 01/01/2019 2112   UROBILINOGEN 0.2 08/04/2018 1721   NITRITE NEGATIVE 01/01/2019 2112   LEUKOCYTESUR NEGATIVE 01/01/2019 2112   Sepsis Labs: @LABRCNTIP (procalcitonin:4,lacticidven:4)  )No results found for this or any previous visit (from the past 240 hour(s)).       Radiology Studies: Ct Abdomen Pelvis W Contrast  Result Date: 01/01/2019 CLINICAL DATA:  Left lower quadrant abdominal pain, nausea, vomiting and diarrhea. EXAM: CT ABDOMEN AND PELVIS WITH CONTRAST TECHNIQUE: Multidetector CT imaging of the abdomen and pelvis was performed using the standard protocol following bolus administration of intravenous  contrast. CONTRAST:  166mL ISOVUE-300 IOPAMIDOL (ISOVUE-300) INJECTION 61% COMPARISON:  08/18/2014 FINDINGS: Lower chest: Mild cardiomegaly with coronary arteriosclerosis. No pericardial effusion or thickening. Atelectasis is noted at each lung base. No effusion or pneumothorax. Hepatobiliary: Status post cholecystectomy. No space-occupying mass of the liver. Slight intrahepatic ductal dilatation consistent with reservoir effect status post cholecystectomy. Post Pancreas: Normal Spleen: Normal Adrenals/Urinary Tract: Normal bilateral adrenal glands and kidneys. Hydroureteronephrosis. The urinary bladder is decompressed and unremarkable. Stomach/Bowel: Transmural thickening of the colon from distal transverse rectum with mucosal enhancement compatible with changes of colitis. Scattered colonic diverticulosis is noted along the sigmoid colon. Proximal to the area of inflammatory thickening are fluid-filled large bowel. No significant small bowel dilatation is noted. Status post appendectomy. Small hiatal hernia. The stomach is unremarkable. Vascular/Lymphatic: Moderate aortoiliac and branch vessel atherosclerosis without aneurysm. No lymphadenopathy. Reproductive: Uterus and bilateral adnexa are  unremarkable. Other: Tiny periumbilical fat containing hernia. No abdominopelvic ascites. Small amount subcutaneous edema overlying the lower lumbar spine and sacrum. Musculoskeletal: Schmorl's node of the superior endplate of L4 with slight depression. 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 No aggressive osseous lesions. IMPRESSION: 1. Transmural thickening of the colon from distal transverse colon through rectum with mucosal enhancement compatible with colitis. No bowel obstruction or perforation. 2. Status post cholecystectomy and appendectomy. 3. Aortoiliac and branch vessel atherosclerosis without aneurysm. 4. 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 Electronically Signed   By: Ashley Royalty M.D.   On: 01/01/2019 22:59         Scheduled Meds: . amiodarone  100 mg Oral Daily  . atorvastatin  80 mg Oral q1800  . cycloSPORINE  1 drop Both Eyes BID  . DULoxetine  60 mg Oral Daily  . guaiFENesin  600 mg Oral BID  . mometasone-formoterol  2 puff Inhalation BID  . nicotine  21 mg Transdermal Daily  . pantoprazole (PROTONIX) IV  40 mg Intravenous QHS  . potassium chloride  40 mEq Oral Q4H  . rOPINIRole  0.5 mg Oral QHS   Continuous Infusions: . sodium chloride    . piperacillin-tazobactam (ZOSYN)  IV 3.375 g (01/02/19 0954)     LOS: 1 day    Time spent: 35 minutes   Dana Allan, MD  [facility] Pager #: [phone] 7PM-7AM contact night coverage as above

## 2019-01-02 NOTE — ED Notes (Signed)
ED TO INPATIENT HANDOFF REPORT  Name/Age/Gender Tonya Bradley 68 y.o. female  Code Status    Code Status Orders  (From admission, onward)         Start     Ordered   01/02/19 0304  Do not attempt resuscitation (DNR)  Continuous    Question Answer Comment  In the event of cardiac or respiratory ARREST Do not call a "code blue"   In the event of cardiac or respiratory ARREST Do not perform Intubation, CPR, defibrillation or ACLS   In the event of cardiac or respiratory ARREST Use medication by any route, position, wound care, and other measures to relive pain and suffering. May use oxygen, suction and manual treatment of airway obstruction as needed for comfort.      01/02/19 0303        Code Status History    Date Active Date Inactive Code Status Order ID Comments User Context   01/19/2018 1557 01/20/2018 1537 Full Code [id number]  Herbie Saxon Inpatient   05/19/2017 1003 05/19/2017 1925 Full Code [id number]  Evans Lance, MD Inpatient   03/13/2016 1748 03/15/2016 1743 Full Code [id number]  Samuella Cota, MD Inpatient      Home/SNF/Other Home  Chief Complaint abdominal pains  Level of Care/Admitting Diagnosis ED Disposition    ED Disposition Condition [facility] Area: [facility] [[id number]]  Level of Care: Telemetry [5]  Admit to tele based on following criteria: Other see comments  Comments: hypokalemia  Diagnosis: Colitis [841660]  Admitting Physician: Toy Baker [[id number]]  Attending Physician: Toy Baker [[id number]]  Estimated length of stay: 3 - 4 days  Certification:: I certify this patient will need inpatient services for at least 2 midnights  PT Class (Do Not Modify): Inpatient [101]  PT Acc Code (Do Not Modify): Private [1]       Medical History Past Medical History:  Diagnosis Date  . Anemia   . Anxiety   . Arthritis    "back"  . Atrial flutter ([place]) 03/13/2016  . CAD (coronary artery disease)    . Cancer ([place])    skin cancer  . Cataract   . Colon polyp   . COPD (chronic obstructive pulmonary disease) ([place])   . Depression   . Dysrhythmia   . Emphysema of lung ([place])   . GERD (gastroesophageal reflux disease)   . Hypercholesterolemia   . Osteoporosis   . Oxygen deficiency     Allergies No Known Allergies  IV Location/Drains/Wounds Patient Lines/Drains/Airways Status   Active Line/Drains/Airways    Name:   Placement date:   Placement time:   Site:   Days:   Peripheral IV 01/01/19 Right Forearm   01/01/19    2108    Forearm   1   Incision (Closed) 01/19/18 Back   01/19/18    1357     348          Labs/Imaging Results for orders placed or performed during the hospital encounter of 01/01/19 (from the past 48 hour(s))  CBC with Differential/Platelet     Status: Abnormal   Collection Time: 01/01/19  9:08 PM  Result Value Ref Range   WBC 22.4 (H) 4.0 - 10.5 K/uL   RBC 4.99 3.87 - 5.11 MIL/uL   Hemoglobin 15.2 (H) 12.0 - 15.0 g/dL   HCT 47.6 (H) 36.0 - 46.0 %   MCV 95.4 80.0 - 100.0 fL   MCH 30.5 26.0 - 34.0  pg   MCHC 31.9 30.0 - 36.0 g/dL   RDW 13.5 11.5 - 15.5 %   Platelets 313 150 - 400 K/uL   nRBC 0.0 0.0 - 0.2 %   Neutrophils Relative % 89 %   Neutro Abs 19.8 (H) 1.7 - 7.7 K/uL   Lymphocytes Relative 5 %   Lymphs Abs 1.2 0.7 - 4.0 K/uL   Monocytes Relative 5 %   Monocytes Absolute 1.1 (H) 0.1 - 1.0 K/uL   Eosinophils Relative 0 %   Eosinophils Absolute 0.1 0.0 - 0.5 K/uL   Basophils Relative 0 %   Basophils Absolute 0.1 0.0 - 0.1 K/uL   Immature Granulocytes 1 %   Abs Immature Granulocytes 0.15 (H) 0.00 - 0.07 K/uL    Comment: Performed at [facility], [place] [address], [place], [place] [address]  Comprehensive metabolic panel     Status: Abnormal   Collection Time: 01/01/19  9:08 PM  Result Value Ref Range   Sodium 139 135 - 145 mmol/L   Potassium 3.2 (L) 3.5 - 5.1 mmol/L   Chloride 101 98 - 111 mmol/L   CO2 26 22 - 32 mmol/L    Glucose, Bld 140 (H) 70 - 99 mg/dL   BUN 20 8 - 23 mg/dL   Creatinine, Ser 0.64 0.44 - 1.00 mg/dL   Calcium 9.8 8.9 - 10.3 mg/dL   Total Protein 8.3 (H) 6.5 - 8.1 g/dL   Albumin 4.4 3.5 - 5.0 g/dL   AST 27 15 - 41 U/L   ALT 26 0 - 44 U/L   Alkaline Phosphatase 120 38 - 126 U/L   Total Bilirubin 0.6 0.3 - 1.2 mg/dL   GFR calc non Af Amer >60 >60 mL/min   GFR calc Af Amer >60 >60 mL/min   Anion gap 12 5 - 15    Comment: Performed at [facility], [place] [address], [place], [place] [address]  Lipase, blood     Status: None   Collection Time: 01/01/19  9:08 PM  Result Value Ref Range   Lipase 35 11 - 51 U/L    Comment: Performed at [facility], [place] [address], [place], [place] [address]  Magnesium     Status: None   Collection Time: 01/01/19  9:08 PM  Result Value Ref Range   Magnesium 2.3 1.7 - 2.4 mg/dL    Comment: Performed at [facility], [place] [address] [place], [place] [address]  Phosphorus     Status: None   Collection Time: 01/01/19  9:08 PM  Result Value Ref Range   Phosphorus 4.2 2.5 - 4.6 mg/dL    Comment: Performed at [facility] [place] [address], [place], [place] [address]  Urinalysis, Routine w reflex microscopic     Status: Abnormal   Collection Time: 01/01/19  9:12 PM  Result Value Ref Range   Color, Urine YELLOW YELLOW   APPearance CLOUDY (A) CLEAR   Specific Gravity, Urine >1.030 (H) 1.005 - 1.030   pH 5.0 5.0 - 8.0   Glucose, UA NEGATIVE NEGATIVE mg/dL   Hgb urine dipstick NEGATIVE NEGATIVE   Bilirubin Urine MODERATE (A) NEGATIVE   Ketones, ur 15 (A) NEGATIVE mg/dL   Protein, ur 30 (A) NEGATIVE mg/dL   Nitrite NEGATIVE NEGATIVE   Leukocytes, UA NEGATIVE NEGATIVE    Comment: Performed at [place] [address] [place],  [address]  Urinalysis, Microscopic (reflex)     Status:  Abnormal   Collection Time: 01/01/19  9:12 PM  Result Value Ref Range   RBC  / HPF 0-5 0 - 5 RBC/hpf   WBC, UA 0-5 0 - 5 WBC/hpf   Bacteria, UA RARE (A) NONE SEEN   Squamous Epithelial / LPF 6-10 0 - 5   Non Squamous Epithelial PRESENT (A) NONE SEEN   Budding Yeast PRESENT    Urine-Other LESS THAN 10 mL OF URINE SUBMITTED     Comment: Performed at [facility], [place] [address] [place], [place] [address]  Lactic acid, plasma     Status: None   Collection Time: 01/02/19  5:06 AM  Result Value Ref Range   Lactic Acid, Venous 0.6 0.5 - 1.9 mmol/L    Comment: Performed at [facility], [place] [address], [place], [place] [address]  Magnesium     Status: None   Collection Time: 01/02/19  5:06 AM  Result Value Ref Range   Magnesium 2.0 1.7 - 2.4 mg/dL    Comment: Performed at [facility], [place] [address] [place], [place] [address]  Phosphorus     Status: None   Collection Time: 01/02/19  5:06 AM  Result Value Ref Range   Phosphorus 4.2 2.5 - 4.6 mg/dL    Comment: Performed at [facility], [place] [address] [place], [place] [address]  TSH     Status: Abnormal   Collection Time: 01/02/19  5:06 AM  Result Value Ref Range   TSH 0.039 (L) 0.350 - 4.500 uIU/mL    Comment: Performed by a 3rd Generation assay with a functional sensitivity of <=0.01 uIU/mL. Performed at [facility], [place] [address], [place], [place] [address]   Comprehensive metabolic panel     Status: Abnormal   Collection Time: 01/02/19  5:06 AM  Result Value Ref Range   Sodium 140 135 - 145 mmol/L   Potassium 3.2 (L) 3.5 - 5.1 mmol/L   Chloride 108 98 - 111 mmol/L   CO2 27 22 - 32 mmol/L   Glucose, Bld 100 (H) 70 - 99 mg/dL   BUN 15 8 - 23 mg/dL   Creatinine, Ser 0.51 0.44 - 1.00 mg/dL   Calcium 7.9 (L) 8.9 - 10.3 mg/dL   Total Protein 5.7 (L) 6.5 - 8.1 g/dL   Albumin 3.2 (L) 3.5 - 5.0 g/dL   AST 15 15 - 41 U/L   ALT 18 0 - 44 U/L   Alkaline Phosphatase 77 38 - 126 U/L   Total Bilirubin 0.8 0.3 - 1.2  mg/dL   GFR calc non Af Amer >60 >60 mL/min   GFR calc Af Amer >60 >60 mL/min   Anion gap 5 5 - 15    Comment: Performed at [facility], [place] [address] [place], [place] [address]  CBC     Status: Abnormal   Collection Time: 01/02/19  5:06 AM  Result Value Ref Range   WBC 13.8 (H) 4.0 - 10.5 K/uL   RBC 3.75 (L) 3.87 - 5.11 MIL/uL   Hemoglobin 11.5 (L) 12.0 - 15.0 g/dL   HCT 36.1 36.0 - 46.0 %   MCV 96.3 80.0 - 100.0 fL   MCH 30.7 26.0 - 34.0 pg   MCHC 31.9 30.0 - 36.0 g/dL   RDW 13.6 11.5 - 15.5 %   Platelets 232 150 - 400 K/uL   nRBC 0.0 0.0 - 0.2 %    Comment: Performed at [facility], [place] [address], [place], [place] [address]   Ct Abdomen Pelvis W Contrast  Result Date: 01/01/2019 CLINICAL DATA:  Left lower quadrant abdominal pain, nausea, vomiting and diarrhea. EXAM: CT ABDOMEN AND PELVIS WITH CONTRAST TECHNIQUE: Multidetector CT imaging of the abdomen and pelvis was performed using the standard protocol following bolus administration of intravenous contrast. CONTRAST:  125mL ISOVUE-300 IOPAMIDOL (ISOVUE-300) INJECTION 61% COMPARISON:  08/18/2014 FINDINGS: Lower chest: Mild cardiomegaly with coronary arteriosclerosis. No pericardial effusion or thickening. Atelectasis is noted at each lung base. No effusion or pneumothorax. Hepatobiliary: Status post cholecystectomy. No space-occupying mass of the liver. Slight intrahepatic ductal dilatation consistent with reservoir effect status post cholecystectomy. Post Pancreas: Normal Spleen: Normal Adrenals/Urinary Tract: Normal bilateral adrenal glands and kidneys. Hydroureteronephrosis. The urinary bladder is decompressed and unremarkable. Stomach/Bowel: Transmural thickening of the colon from distal transverse rectum with mucosal enhancement compatible with changes of colitis. Scattered colonic diverticulosis is noted along the sigmoid colon. Proximal to the area of inflammatory thickening are fluid-filled  large bowel. No significant small bowel dilatation is noted. Status post appendectomy. Small hiatal hernia. The stomach is unremarkable. Vascular/Lymphatic: Moderate aortoiliac and branch vessel atherosclerosis without aneurysm. No lymphadenopathy. Reproductive: Uterus and bilateral adnexa are unremarkable. Other: Tiny periumbilical fat containing hernia. No abdominopelvic ascites. Small amount subcutaneous edema overlying the lower lumbar spine and sacrum. Musculoskeletal: Schmorl's node of the superior endplate of L4 with slight depression. 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 No aggressive osseous lesions. IMPRESSION: 1. Transmural thickening of the colon from distal transverse colon through rectum with mucosal enhancement compatible with colitis. No bowel obstruction or perforation. 2. Status post cholecystectomy and appendectomy. 3. Aortoiliac and branch vessel atherosclerosis without aneurysm. 4. 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 Electronically Signed   By: Ashley Royalty M.D.   On: 01/01/2019 22:59    Pending Labs Unresulted Labs (From admission, onward)    Start     Ordered   01/03/19 0500  Renal function panel  Tomorrow morning,   R     01/02/19 1202   01/03/19 0500  Magnesium  Tomorrow morning,   R     01/02/19 1202   01/03/19 0500  CBC with Differential/Platelet  Tomorrow morning,   R     01/02/19 1202   01/02/19 0500  HIV antibody (Routine Testing)  Tomorrow morning,   R     01/02/19 0303   01/01/19 2334  Gastrointestinal Panel by PCR , Stool  (Gastrointestinal Panel by PCR, Stool)  Once,   R     01/01/19 2333   01/01/19 2117  Urine culture  ONCE - STAT,   STAT     01/01/19 2116          Vitals/Pain Today's Vitals   01/02/19 0600 01/02/19 0630 01/02/19 0700 01/02/19 1201  BP: (!) 103/57 (!) 106/59 113/62 105/60  Pulse: 83 81 86 82  Resp: 17 19 (!) 27 16  Temp:      TempSrc:      SpO2: 95% 96% 95% 91%  Weight:      Height:      PainSc:        Isolation  Precautions Enteric precautions (UV disinfection)  Medications Medications  potassium chloride 10 mEq in 100 mL IVPB (0 mEq Intravenous Stopped 01/02/19 0818)  nicotine (NICODERM CQ - dosed in mg/24 hours) patch 21 mg (21 mg Transdermal Patch Applied 01/02/19 0453)  traMADol (ULTRAM) tablet 50 mg (50 mg Oral Given 01/02/19 0454)  amiodarone (PACERONE) tablet  100 mg (100 mg Oral Given 01/02/19 0948)  atorvastatin (LIPITOR) tablet 80 mg (has no administration in time range)  ALPRAZolam (XANAX) tablet 0.5 mg (has no administration in time range)  DULoxetine (CYMBALTA) DR capsule 60 mg (60 mg Oral Given 01/02/19 0948)  rOPINIRole (REQUIP) tablet 0.5 mg (has no administration in time range)  cycloSPORINE (RESTASIS) 0.05 % ophthalmic emulsion 1 drop (1 drop Both Eyes Given 01/02/19 1015)  acetaminophen (TYLENOL) tablet 650 mg (has no administration in time range)    Or  acetaminophen (TYLENOL) suppository 650 mg (has no administration in time range)  ondansetron (ZOFRAN) tablet 4 mg (has no administration in time range)    Or  ondansetron (ZOFRAN) injection 4 mg (has no administration in time range)  0.9 %  sodium chloride infusion (75 mL/hr Intravenous New Bag/Given 01/02/19 0326)  albuterol (PROVENTIL) (2.5 MG/3ML) 0.083% nebulizer solution 2.5 mg (has no administration in time range)  guaiFENesin (MUCINEX) 12 hr tablet 600 mg (600 mg Oral Given 01/02/19 0948)  mometasone-formoterol (DULERA) 100-5 MCG/ACT inhaler 2 puff (2 puffs Inhalation Given 01/02/19 0949)  morphine 2 MG/ML injection 2-3 mg (2 mg Intravenous Given 01/02/19 0950)  pantoprazole (PROTONIX) injection 40 mg (40 mg Intravenous Given 01/02/19 0258)  piperacillin-tazobactam (ZOSYN) IVPB 3.375 g (3.375 g Intravenous New Bag/Given 01/02/19 0954)  potassium chloride SA (K-DUR,KLOR-CON) CR tablet 40 mEq (has no administration in time range)  sodium chloride 0.9 % bolus 1,000 mL (0 mLs Intravenous Stopped 01/02/19 0222)  ondansetron (ZOFRAN) injection 4 mg (4  mg Intravenous Given 01/01/19 2155)  morphine 4 MG/ML injection 4 mg (4 mg Intravenous Given 01/01/19 2157)  iopamidol (ISOVUE-300) 61 % injection 100 mL (100 mLs Intravenous Contrast Given 01/01/19 2236)  sodium chloride (PF) 0.9 % injection (  Given by Other 01/01/19 2305)  morphine 4 MG/ML injection 4 mg (4 mg Intravenous Given 01/01/19 2340)  piperacillin-tazobactam (ZOSYN) IVPB 3.375 g (0 g Intravenous Stopped 01/02/19 0456)    Mobility walks

## 2019-01-02 NOTE — Progress Notes (Signed)
PT Cancellation Note  Patient Details Name: Tonya Bradley MRN: [id number] DOB: February 25, 1951   Cancelled Treatment:      Spoke with pt and nurse. Pt has been ambulating with no AD to the bathroom int eh room. No concerns at this time. She did not want ot walk or get up at this time due to the abdominal pain she was having.  Will check on pt tomorrow, however I do not feel she will have any acute PT needs or DC needs. She is current with OPPT for back pain, that she could continue once out of the hospital.   Clide Dales, [place] Pager: [phone] Office: [phone] 01/02/2019  Clide Dales 01/02/2019, 5:42 PM

## 2019-01-02 NOTE — Progress Notes (Signed)
Pharmacy Antibiotic Note  Tonya Bradley is a 68 y.o. female admitted on 01/01/2019 with Intra-abdominal infection.  Pharmacy has been consulted for zosyn dosing.  Plan: Zosyn 3.375g IV q8h (4 hour infusion).  Height: 5\' 2"  (157.5 cm) Weight: 161 lb (73 kg) IBW/kg (Calculated) : 50.1  Temp (24hrs), Avg:98.2 F (36.8 C), Min:98.2 F (36.8 C), Max:98.2 F (36.8 C)  Recent Labs  Lab 01/01/19 2108  WBC 22.4*  CREATININE 0.64    Estimated Creatinine Clearance: 63 mL/min (by C-G formula based on SCr of 0.64 mg/dL).    No Known Allergies  Antimicrobials this admission: Zosyn 01/02/2019 >>   Dose adjustments this admission: -  Microbiology results: -  Thank you for allowing pharmacy to be a part of this patient's care.  Nani Skillern [place] 01/02/2019 1:12 AM

## 2019-01-02 NOTE — Progress Notes (Signed)
Patient's order for fluids expired.  Dr. Marthenia Rolling notified.  Dr. Marthenia Rolling gave verbal order for NS @ 40 ml/hr.

## 2019-01-03 LAB — CBC WITH DIFFERENTIAL/PLATELET
Abs Immature Granulocytes: 0.05 10*3/uL (ref 0.00–0.07)
Basophils Absolute: 0.1 10*3/uL (ref 0.0–0.1)
Basophils Relative: 1 %
Eosinophils Absolute: 0.1 10*3/uL (ref 0.0–0.5)
Eosinophils Relative: 1 %
HCT: 36 % (ref 36.0–46.0)
Hemoglobin: 11 g/dL — ABNORMAL LOW (ref 12.0–15.0)
Immature Granulocytes: 1 %
Lymphocytes Relative: 17 %
Lymphs Abs: 1.8 10*3/uL (ref 0.7–4.0)
MCH: 29.6 pg (ref 26.0–34.0)
MCHC: 30.6 g/dL (ref 30.0–36.0)
MCV: 96.8 fL (ref 80.0–100.0)
Monocytes Absolute: 0.8 10*3/uL (ref 0.1–1.0)
Monocytes Relative: 7 %
Neutro Abs: 8.1 10*3/uL — ABNORMAL HIGH (ref 1.7–7.7)
Neutrophils Relative %: 73 %
Platelets: 255 10*3/uL (ref 150–400)
RBC: 3.72 MIL/uL — ABNORMAL LOW (ref 3.87–5.11)
RDW: 13.6 % (ref 11.5–15.5)
WBC: 11 10*3/uL — ABNORMAL HIGH (ref 4.0–10.5)
nRBC: 0 % (ref 0.0–0.2)

## 2019-01-03 LAB — RENAL FUNCTION PANEL
Albumin: 3.1 g/dL — ABNORMAL LOW (ref 3.5–5.0)
Anion gap: 3 — ABNORMAL LOW (ref 5–15)
BUN: 7 mg/dL — ABNORMAL LOW (ref 8–23)
CO2: 25 mmol/L (ref 22–32)
Calcium: 8.6 mg/dL — ABNORMAL LOW (ref 8.9–10.3)
Chloride: 112 mmol/L — ABNORMAL HIGH (ref 98–111)
Creatinine, Ser: 0.51 mg/dL (ref 0.44–1.00)
GFR calc Af Amer: >60 mL/min (ref >60)
GFR calc non Af Amer: >60 mL/min (ref >60)
Glucose, Bld: 104 mg/dL — ABNORMAL HIGH (ref 70–99)
Phosphorus: 3.1 mg/dL (ref 2.5–4.6)
Potassium: 3.9 mmol/L (ref 3.5–5.1)
Sodium: 140 mmol/L (ref 135–145)

## 2019-01-03 LAB — URINE CULTURE: Culture: <10000 — AB

## 2019-01-03 LAB — MAGNESIUM: Magnesium: 2.1 mg/dL (ref 1.7–2.4)

## 2019-01-03 MED ORDER — ONDANSETRON HCL 4 MG PO TABS: 4.0000 mg | ORAL_TABLET | Freq: Four times a day (QID) | ORAL | 0 refills | Status: DC | PRN

## 2019-01-03 MED ORDER — AMOXICILLIN-POT CLAVULANATE 875-125 MG PO TABS
1.0000 | ORAL_TABLET | Freq: Once | ORAL | Status: AC
  Administered 2019-01-03: 1 via ORAL
  Filled 2019-01-03: qty 1

## 2019-01-03 MED ORDER — OXYCODONE HCL 5 MG PO TABS: 5.0000 mg | ORAL_TABLET | Freq: Four times a day (QID) | ORAL | Status: DC | PRN

## 2019-01-03 MED ORDER — AMOXICILLIN-POT CLAVULANATE 875-125 MG PO TABS: 1.0000 | ORAL_TABLET | Freq: Two times a day (BID) | ORAL | 0 refills | Status: AC

## 2019-01-03 MED ORDER — IPRATROPIUM-ALBUTEROL 0.5-2.5 (3) MG/3ML IN SOLN
3.0000 mL | Freq: Four times a day (QID) | RESPIRATORY_TRACT | Status: DC
  Filled 2019-01-03: qty 3

## 2019-01-03 MED ORDER — OXYCODONE HCL 5 MG PO TABS: 5.0000 mg | ORAL_TABLET | Freq: Four times a day (QID) | ORAL | 0 refills | Status: DC | PRN

## 2019-01-03 MED ORDER — AZITHROMYCIN 500 MG PO TABS: ORAL_TABLET | ORAL | 0 refills | Status: AC

## 2019-01-03 NOTE — Discharge Summary (Signed)
Physician Discharge Summary  Patient ID: Tonya Bradley MRN: [id number] DOB/AGE: [birth year] 68 y.o.  Admit date: 01/01/2019 Discharge date: 01/03/2019  Admission Diagnoses:  Discharge Diagnoses:  Active Problems:   Infectious colitis secondary to Campylobacter and enteropathogenic E. coli   Chronic hypoxemic respiratory failure (HCC)   Chronic pain disorder   COPD (chronic obstructive pulmonary disease) (HCC)   GERD (gastroesophageal reflux disease)   Dehydration   Hypokalemia   Discharged Condition: stable  Hospital Course:  Tonya Bradley a 68 year old female with past medical history significant for COPD on continuous home oxygen 3 L/min, tobacco abuse that is continuous, CAD,GERD, atrial flutter status post flutter ablation and irritable bowel syndrome.  Patient presented with chills, abdominal cramps, nausea and vomiting and bloody diarrhea.  CT scan of the abdomen and pelvis revealed colitis involving distal transverse colon to the rectum.  Significant leukocytosis is noted.    Gastrointestinal panel revealed Campylobacter specie and enteropathogenic E. coli.  Patient was admitted, treated with antibiotics, managed supportively, including volume resuscitation.  Lactic acid was 0.6.    Colitis, likely infectious colitis: -Lactic acid was 0.6.   -Significant leukocytosis noted.  Patient, WBC was 22.4, and down to 11 prior to discharge. -Patient symptoms have improved significantly with IV antibiotics treatment and IV fluids.   -Patient will be discharged back on to complete course of oral azithromycin.  Also be discharged back home on Augmentin.   -History of lymphocytic colitis noted.    Chronic hypoxemic respiratory failure: Stable for now.  Continue current management.   Chronic paindisorder: Stable f or now. Continue home medications  COPD(chronic obstructive pulmonary disease): Stable.   Continue albuterol as needed and Dulera.  Marland Kitchen GERD(gastroesophageal  reflux disease)stable continue home medications and states that in the past she have to have a test throat stretched but currently doing okay  Volume depletion:  Resolved significantly  Hypokalemia: Continue to monitor and replace. Monitor magnesium level.  Tobacco abuse: Conseled  Hx of atrial Flutter: Status post ablation.  Consults: None  Significant Diagnostic Studies:  Gastrointestinal panel was positive for Campylobacter species and enteropathogenic E. coli  Discharge Exam: Blood pressure 111/61, pulse 82, temperature 98.4 F (36.9 C), temperature source Oral, resp. rate 20, height 5\' 2"  (1.575 m), weight 73 kg, SpO2 93 %.  Disposition: Discharge disposition: 01-Home or Self Care   Discharge Instructions    Diet - low sodium heart healthy   Complete by:  As directed    Increase activity slowly   Complete by:  As directed      Allergies as of 01/03/2019   No Known Allergies     Medication List    STOP taking these medications   doxepin 10 MG capsule Commonly known as:  SINEQUAN   traMADol 50 MG tablet Commonly known as:  ULTRAM     TAKE these medications   ALPRAZolam 0.5 MG tablet Commonly known as:  XANAX TAKE 1 TABLET BY MOUTH TWICE DAILY AS NEEDED FOR ANXIETY; MUST LAST 30 DAYS   amiodarone 200 MG tablet Commonly known as:  PACERONE Take 1/2 tablet (100 mg) by mouth once daily What changed:    how much to take  how to take this  when to take this   amoxicillin-clavulanate 875-125 MG tablet Commonly known as:  AUGMENTIN Take 1 tablet by mouth 2 (two) times daily for 5 days.   aspirin EC 81 MG tablet Take 81 mg by mouth daily.   atorvastatin 80 MG tablet Commonly known  as:  LIPITOR TAKE 1 TABLET(80 MG) BY MOUTH DAILY   azithromycin 500 MG tablet Commonly known as:  ZITHROMAX Azithromycin 500 mg po daily for 2 days Start taking on:  January 04, 2019   cycloSPORINE 0.05 % ophthalmic emulsion Commonly known as:  RESTASIS Place 1  drop into both eyes 2 (two) times daily.   DEXILANT 30 MG capsule Generic drug:  Dexlansoprazole TAKE 1 CAPSULE(30 MG) BY MOUTH DAILY   DULoxetine 60 MG capsule Commonly known as:  CYMBALTA Take 1 capsule (60 mg total) by mouth daily. What changed:  when to take this   furosemide 20 MG tablet Commonly known as:  LASIX 1 tab 3 times per day.   LINZESS 290 MCG Caps capsule Generic drug:  linaclotide TAKE 1 CAPSULE(290 MCG) BY MOUTH DAILY BEFORE BREAKFAST   multivitamin tablet Take 1 tablet by mouth daily.   nitroGLYCERIN 0.4 MG SL tablet Commonly known as:  NITROSTAT Place 1 tablet (0.4 mg total) under the tongue every 5 (five) minutes as needed for chest pain. Reported on 06/12/2016   ondansetron 4 MG tablet Commonly known as:  ZOFRAN Take 1 tablet (4 mg total) by mouth every 6 (six) hours as needed for nausea.   oxyCODONE 5 MG immediate release tablet Commonly known as:  Oxy IR/ROXICODONE Take 1 tablet (5 mg total) by mouth every 6 (six) hours as needed for severe pain.   potassium chloride 10 MEQ tablet Commonly known as:  K-DUR TAKE 1 TABLET(10 MEQ) BY MOUTH DAILY   rOPINIRole 0.25 MG tablet Commonly known as:  REQUIP Take 2 tablets (0.5 mg total) by mouth at bedtime.   terbinafine 250 MG tablet Commonly known as:  LAMISIL Take 250 mg by mouth daily.   TRELEGY ELLIPTA 100-62.5-25 MCG/INH Aepb Generic drug:  Fluticasone-Umeclidin-Vilant INHALE 1 PUFF INTO THE LUNGS DAILY   VENTOLIN HFA 108 (90 Base) MCG/ACT inhaler Generic drug:  albuterol INHALE 2 PUFFS INTO THE LUNGS EVERY 6 HOURS AS NEEDED FOR WHEEZING OR SHORTNESS OF BREATH   Vitamin D 50 MCG (2000 UT) Caps Take 2,000 Units by mouth daily.      Follow-up Information    Martinique, [name], MD.   Specialty:  Family Medicine Contact information: [place] [address] [phone]           Signed: Bonnell Public 01/03/2019, 3:23 PM

## 2019-01-03 NOTE — Progress Notes (Signed)
Patient c/o pain 5/10.  Refusing Tramadol - states, "It does not work."  Dr. Marthenia Rolling notified.  Patient ate approximately 25% of full liquid tray.

## 2019-01-03 NOTE — Progress Notes (Signed)
Patient requested that antibiotic and pain medicine be called into [facility] on [address].  Dr. Marthenia Rolling notified via text page.  Dr. Marthenia Rolling returned call and gave order for nurse to give one dose of oral Augmentin now, since IV has been removed and Zosyn would be a 4 hour infusion.  Dr. Duanne Limerick stated for patient to take her Ultram for now until she can get to the [place] location to pick up her new pain medication and antibiotic.    Patient notified and verbalized understanding of discharge instructions, physician follow-up, medications.  Patient's IV removed.  Site WNL.  Patient stable awaiting discharge home.  Patient's husband provided home portable oxygen tank for transport home.

## 2019-01-03 NOTE — Progress Notes (Signed)
PT Cancellation Note  Patient Details Name: Tonya Bradley MRN: [id number] DOB: August 31, 1951   Cancelled Treatment:     checked on pt again this afternoon. Pt has still been moving aroudn in her room and does not have concerns about mobility or being able to mobilize when she returns home. Not acute PT needs, will sign off.      Clide Dales 01/03/2019, 6:21 PM  Clide Dales, PT Acute Rehabilitation Services Pager: [phone] Office: [phone] 01/03/2019

## 2019-01-04 ENCOUNTER — Encounter: Payer: Medicare HMO | Admitting: Physical Therapy

## 2019-01-05 ENCOUNTER — Telehealth: Payer: Self-pay | Admitting: Family Medicine

## 2019-01-05 NOTE — Telephone Encounter (Signed)
Transition Care Management Follow-up Telephone Call  Date discharged?   Admit date: 01/01/2019     Discharge date: 01/03/2019      Discharged Condition: stable     Disposition:      Discharge disposition: 01-Home or Self Care      Discharge Instructions      Diet - low sodium heart healthy   How have you been since you were released from the hospital? "Getting a little better, every seems to be better. I'm still a little weak and nauseous. I finally had a BM  - had not had a BM since being home. I am still really weak and 'wabbly' " Pt states that she did not feel that she had very good care while in the hospital, Pt feels that she was not ready to be discharged and she felt that she was forced to go home.   Do you understand why you were in the hospital? yes   Do you understand the discharge instructions? no   Where were you discharged to? Home with husband   Items Reviewed:  Medications reviewed: yes, new medications added - Zofran 1 tablet (4 mg total) by mouth every 6 (six) hours as needed for nausea, Azithromycin 500 mg po daily for 2 days and Augmentin 1 tablet by mouth 2 (two) times daily for 5 days  Allergies reviewed: yes  Dietary changes reviewed: yes  Referrals reviewed: yes, Appt scheduled with GI on 01/11/2019   Functional Questionnaire:   Activities of Daily Living (ADLs):   She states they are independent in the following: ambulation, bathing and hygiene, feeding, continence, grooming, toileting and dressing States they require assistance with the following: n/a   Any transportation issues/concerns?: Yes, Husband will bring her   Any patient concerns? no   Confirmed importance and date/time of follow-up visits scheduled no  Provider Appointment booked with Dr Martinique 01/06/2019  Confirmed with patient if condition begins to worsen call PCP or go to the ER.  Patient was given the office number and encouraged to call back with question or concerns.  :   Yes

## 2019-01-06 ENCOUNTER — Telehealth: Payer: Self-pay | Admitting: Physical Therapy

## 2019-01-06 ENCOUNTER — Other Ambulatory Visit: Payer: Self-pay | Admitting: Family Medicine

## 2019-01-06 ENCOUNTER — Ambulatory Visit (INDEPENDENT_AMBULATORY_CARE_PROVIDER_SITE_OTHER): Payer: Medicare HMO | Admitting: Family Medicine

## 2019-01-06 ENCOUNTER — Ambulatory Visit: Payer: Medicare HMO | Attending: Physical Medicine & Rehabilitation | Admitting: Physical Therapy

## 2019-01-06 ENCOUNTER — Encounter: Payer: Medicare HMO | Admitting: Physical Therapy

## 2019-01-06 ENCOUNTER — Encounter: Payer: Self-pay | Admitting: Family Medicine

## 2019-01-06 VITALS — BP 124/76 | HR 85 | Temp 98.8°F | Resp 16 | Ht 62.0 in | Wt 170.4 lb

## 2019-01-06 DIAGNOSIS — E876 Hypokalemia: Secondary | ICD-10-CM

## 2019-01-06 DIAGNOSIS — A09 Infectious gastroenteritis and colitis, unspecified: Secondary | ICD-10-CM

## 2019-01-06 DIAGNOSIS — M6281 Muscle weakness (generalized): Secondary | ICD-10-CM | POA: Insufficient documentation

## 2019-01-06 DIAGNOSIS — I483 Typical atrial flutter: Secondary | ICD-10-CM | POA: Diagnosis not present

## 2019-01-06 DIAGNOSIS — M5442 Lumbago with sciatica, left side: Secondary | ICD-10-CM | POA: Insufficient documentation

## 2019-01-06 DIAGNOSIS — A045 Campylobacter enteritis: Secondary | ICD-10-CM

## 2019-01-06 DIAGNOSIS — A040 Enteropathogenic Escherichia coli infection: Secondary | ICD-10-CM

## 2019-01-06 DIAGNOSIS — G8929 Other chronic pain: Secondary | ICD-10-CM | POA: Insufficient documentation

## 2019-01-06 DIAGNOSIS — Z720 Tobacco use: Secondary | ICD-10-CM | POA: Diagnosis not present

## 2019-01-06 DIAGNOSIS — R110 Nausea: Secondary | ICD-10-CM

## 2019-01-06 DIAGNOSIS — M5441 Lumbago with sciatica, right side: Secondary | ICD-10-CM | POA: Insufficient documentation

## 2019-01-06 MED ORDER — ONDANSETRON HCL 4 MG PO TABS: 4.0000 mg | ORAL_TABLET | Freq: Three times a day (TID) | ORAL | 0 refills | Status: DC | PRN

## 2019-01-06 MED ORDER — DILTIAZEM HCL ER COATED BEADS 120 MG PO CP24: 120.0000 mg | ORAL_CAPSULE | Freq: Every day | ORAL | 1 refills | Status: DC

## 2019-01-06 NOTE — Telephone Encounter (Signed)
LM for Pt to return our call.  She was a no show for today 01/06/19 at 2pm for physical therapy appt.  Need to confirm next visit on 01/11/19 when she calls back.  Oney Folz, PT 01/06/19 2:19 PM

## 2019-01-06 NOTE — Patient Instructions (Addendum)
A few things to remember from today's visit:   Colitis, infectious  Typical atrial flutter ([place]) - Plan: diltiazem (CARDIZEM CD) 120 MG 24 hr capsule  Tobacco abuse Monitor blood pressure at home. Today we started diltiazem because your heart rate was 129/min, irregular. Please arrange appointment with your cardiologist. Monitor heart rate at home.  Strongly recommend smoking cessation.  Keep appointment with your gastroenterologist. Keep next follow-up appointment.   Please be sure medication list is accurate. If a new problem present, please set up appointment sooner than planned today.

## 2019-01-06 NOTE — Assessment & Plan Note (Signed)
We discussed adverse effects of tobacco use as well as benefits of smoking cessation. She is not interested in quitting smoking.

## 2019-01-06 NOTE — Assessment & Plan Note (Signed)
K+ in normal range upon hospital discharge, no changes in current management. We will plan on checking BMP next visit.

## 2019-01-06 NOTE — Assessment & Plan Note (Signed)
Not rate or rhythm control. Recommend diltiazem CD 120 mg daily, we discussed some side effects. Continues to arrange follow-up appointment with her cardiologist, she supposed to follow annually and next appointment should be 01/2019. Recommend continue monitoring BP and to start monitoring HR. She was clearly instructed about warning signs.

## 2019-01-06 NOTE — Progress Notes (Signed)
HPI:   [name] is a 68 y.o. female, who is here today with her husband to follow on recent hospitalization. She was admitted on 01/01/2019 and discharged on 01/03/2019.  Husband called EMS because worsening nausea,vomiting, abdominal pain ,and bloody diarrhea.  2 days before symptoms started she and her husband ate in a local restaurant, not sure if exposed to contaminated food. Her husband ate part of her food and did not get sick.  Diagnosed with infectious colitis secondary to Campylobacter and enteropathogenic E. coli. She presented to the ER the day of admission complaining of chills, abdominal cramps, nausea, vomiting, and bloody diarrhea. Abdominal CT revealed colitis involving distal transverse colon through the rectum.    Lab Results  Component Value Date   WBC 11.0 (H) 01/03/2019   HGB 11.0 (L) 01/03/2019   HCT 36.0 01/03/2019   MCV 96.8 01/03/2019   PLT 255 01/03/2019    Lab Results  Component Value Date   CREATININE 0.51 01/03/2019   BUN 7 (L) 01/03/2019   NA 140 01/03/2019   K 3.9 01/03/2019   CL 112 (H) 01/03/2019   CO2 25 01/03/2019   1. Transmural thickening of the colon from distal transverse colon through rectum with mucosal enhancement compatible with colitis. No bowel obstruction or perforation. 2. Status post cholecystectomy and appendectomy. 3. Aortoiliac and branch vessel atherosclerosis without aneurysm. 4. 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 Diarrhea has resolved.   Still having some nausea, no vomiting. She has not had fever but "little" chills.  Decreased appetite. Now she is exacerbated by food intake or a smell. She denies urinary symptoms.  She was discharged home. Home health services were not deemed necessary.  She has appointment with gastroenterologist next week.  She is also concerned about weight gain despite of decreased appetite and recent illness. Tonya Bradley is feeling the same. She denies orthopnea or  PND.   Noted irregular heart rate and tachycardia. Atrial flutter, she supposed to follow with cardiologist annually. She is currently on amiodarone.  Occasionally she has palpitations. She denies chest pain, dyspnea, or diaphoresis.  Still smoking, she is not interested in smoking cessation. States that she had nicotine patch during hospitalization, it did not help.   Review of Systems  Constitutional: Positive for fatigue. Negative for activity change, appetite change and fever.  HENT: Negative for mouth sores, nosebleeds and trouble swallowing.   Eyes: Negative for redness and visual disturbance.  Respiratory: Negative for cough, shortness of breath and wheezing.   Cardiovascular: Positive for palpitations and leg swelling. Negative for chest pain.  Gastrointestinal: Negative for abdominal pain, diarrhea, nausea and vomiting.  Genitourinary: Negative for decreased urine volume, dysuria and hematuria.  Neurological: Negative for seizures, syncope, weakness, numbness and headaches.  Psychiatric/Behavioral: Positive for sleep disturbance. The patient is nervous/anxious.      Current Outpatient Medications on File Prior to Visit  Medication Sig Dispense Refill  . ALPRAZolam (XANAX) 0.5 MG tablet TAKE 1 TABLET BY MOUTH TWICE DAILY AS NEEDED FOR ANXIETY; MUST LAST 30 DAYS 40 tablet 1  . amiodarone (PACERONE) 200 MG tablet Take 1/2 tablet (100 mg) by mouth once daily (Patient taking differently: Take 100 mg by mouth daily. Take 1/2 tablet (100 mg) by mouth once daily) 90 tablet 3  . aspirin EC 81 MG tablet Take 81 mg by mouth daily.    Marland Kitchen atorvastatin (LIPITOR) 80 MG tablet TAKE 1 TABLET(80 MG) BY MOUTH DAILY 90 tablet 1  . Cholecalciferol (  VITAMIN D) 2000 units CAPS Take 2,000 Units by mouth daily.    . cycloSPORINE (RESTASIS) 0.05 % ophthalmic emulsion Place 1 drop into both eyes 2 (two) times daily.    Marland Kitchen DEXILANT 30 MG capsule TAKE 1 CAPSULE(30 MG) BY MOUTH DAILY 90 capsule 2  .  DULoxetine (CYMBALTA) 60 MG capsule Take 1 capsule (60 mg total) by mouth daily. (Patient taking differently: Take 60 mg by mouth at bedtime. ) 90 capsule 1  . furosemide (LASIX) 20 MG tablet 1 tab 3 times per day. 90 tablet 3  . LINZESS 290 MCG CAPS capsule TAKE 1 CAPSULE(290 MCG) BY MOUTH DAILY BEFORE BREAKFAST 30 capsule 0  . Multiple Vitamin (MULTIVITAMIN) tablet Take 1 tablet by mouth daily.    . nitroGLYCERIN (NITROSTAT) 0.4 MG SL tablet Place 1 tablet (0.4 mg total) under the tongue every 5 (five) minutes as needed for chest pain. Reported on 06/12/2016 30 tablet 2  . oxyCODONE (OXY IR/ROXICODONE) 5 MG immediate release tablet Take 1 tablet (5 mg total) by mouth every 6 (six) hours as needed for severe pain. 30 tablet 0  . potassium chloride (K-DUR) 10 MEQ tablet TAKE 1 TABLET(10 MEQ) BY MOUTH DAILY 90 tablet 2  . rOPINIRole (REQUIP) 0.25 MG tablet Take 2 tablets (0.5 mg total) by mouth at bedtime. 180 tablet 0  . terbinafine (LAMISIL) 250 MG tablet Take 250 mg by mouth daily.    . TRELEGY ELLIPTA 100-62.5-25 MCG/INH AEPB INHALE 1 PUFF INTO THE LUNGS DAILY 60 each 3  . VENTOLIN HFA 108 (90 Base) MCG/ACT inhaler INHALE 2 PUFFS INTO THE LUNGS EVERY 6 HOURS AS NEEDED FOR WHEEZING OR SHORTNESS OF BREATH 18 g 0   No current facility-administered medications on file prior to visit.      Past Medical History:  Diagnosis Date  . Anemia   . Anxiety   . Arthritis    "back"  . Atrial flutter ([place]) 03/13/2016  . CAD (coronary artery disease)   . Cancer ([place])    skin cancer  . Cataract   . Colon polyp   . COPD (chronic obstructive pulmonary disease) ([place])   . Depression   . Dysrhythmia   . Emphysema of lung ([place])   . GERD (gastroesophageal reflux disease)   . Hypercholesterolemia   . Osteoporosis   . Oxygen deficiency    No Known Allergies  Social History   Socioeconomic History  . Marital status: Married    Spouse name: Not on file  . Number of children: 3  . Years of education:  10  . Highest education level: Not on file  Occupational History  . Occupation: retired    Comment: customer service [employer]  . Financial resource strain: Not on file  . Food insecurity:    Worry: Not on file    Inability: Not on file  . Transportation needs:    Medical: Not on file    Non-medical: Not on file  Tobacco Use  . Smoking status: Current Every Day Smoker    Packs/day: 1.50    Years: 45.00    Pack years: 67.50    Types: Cigarettes    Start date: 06/14/1966  . Smokeless tobacco: Never Used  . Tobacco comment: 1-2 packs daily  Substance and Sexual Activity  . Alcohol use: No    Alcohol/week: 0.0 standard drinks  . Drug use: No  . Sexual activity: Yes    Birth control/protection: Post-menopausal  Lifestyle  . Physical activity:  Days per week: Not on file    Minutes per session: Not on file  . Stress: Not on file  Relationships  . Social connections:    Talks on phone: Not on file    Gets together: Not on file    Attends religious service: Not on file    Active member of club or organization: Not on file    Attends meetings of clubs or organizations: Not on file    Relationship status: Not on file  Other Topics Concern  . Not on file  Social History Narrative   Quit high school and got married in 10th grade, age 55   First husband died of colon cancer at young age   Married again 2018   Lives in own home   Daughter lives with her   Drinks coffee seldom, too much "sweet tea"    Vitals:   01/06/19 1026  BP: 124/76  Pulse: 85  Resp: 16  Temp: 98.8 F (37.1 C)  SpO2: 94%   Body mass index is 31.16 kg/m.    Physical Exam  Nursing note and vitals reviewed. Constitutional: She is oriented to person, place, and time. She appears well-developed. No distress.  HENT:  Head: Normocephalic and atraumatic.  Mouth/Throat: Oropharynx is clear and moist. Mucous membranes are dry.  Eyes: Pupils are equal, round, and reactive to light.  Conjunctivae are normal.  Neck: No JVD present.  Cardiovascular: An irregularly irregular rhythm present. Tachycardia present.  No murmur heard. Pulses:      Dorsalis pedis pulses are 2+ on the right side and 2+ on the left side.  HR 129/min by my count  Respiratory: Effort normal and breath sounds normal. No respiratory distress.  GI: Soft. She exhibits no mass. There is no hepatomegaly. There is no abdominal tenderness.  Musculoskeletal:        General: Edema (2+ pitting LE edema,bilateral.) present.  Lymphadenopathy:    She has no cervical adenopathy.  Neurological: She is alert and oriented to person, place, and time. She has normal strength. No cranial nerve deficit. Gait normal.  Skin: Skin is warm. No rash noted. No erythema.  Psychiatric: Her mood appears anxious.  Well groomed, good eye contact.    ASSESSMENT AND PLAN:  Ms. Rosselyn was seen today for hospitalization follow-up.  Diagnoses and all orders for this visit:   Colitis, infectious Symptoms have improved. She has an aptt with GI next week.  Nausea without vomiting Vomiting has resolved. Adequate hydration,small and frequent sips of clear fluids. Instructed about warning signs.  -     ondansetron (ZOFRAN) 4 MG tablet; Take 1 tablet (4 mg total) by mouth every 8 (eight) hours as needed for up to 5 days for nausea.  Atrial flutter ([place]) Not rate or rhythm control. Recommend diltiazem CD 120 mg daily, we discussed some side effects. Continues to arrange follow-up appointment with her cardiologist, she supposed to follow annually and next appointment should be 01/2019. Recommend continue monitoring BP and to start monitoring HR. She was clearly instructed about warning signs.   Tobacco abuse We discussed adverse effects of tobacco use as well as benefits of smoking cessation. She is not interested in quitting smoking.  Hypokalemia K+ in normal range upon hospital discharge, no changes in current  management. We will plan on checking BMP next visit.   Return if symptoms worsen or fail to improve, for Keep next f/u appt.    Kandy Towery G. Martinique, MD  [facility]. [place]  office.

## 2019-01-07 DIAGNOSIS — J449 Chronic obstructive pulmonary disease, unspecified: Secondary | ICD-10-CM | POA: Diagnosis not present

## 2019-01-11 ENCOUNTER — Ambulatory Visit (INDEPENDENT_AMBULATORY_CARE_PROVIDER_SITE_OTHER): Payer: Medicare HMO | Admitting: Gastroenterology

## 2019-01-11 ENCOUNTER — Encounter: Payer: Self-pay | Admitting: Acute Care

## 2019-01-11 ENCOUNTER — Encounter: Payer: Medicare HMO | Admitting: Physical Therapy

## 2019-01-11 ENCOUNTER — Encounter: Payer: Self-pay | Admitting: Physical Therapy

## 2019-01-11 ENCOUNTER — Encounter: Payer: Self-pay | Admitting: Gastroenterology

## 2019-01-11 ENCOUNTER — Ambulatory Visit (INDEPENDENT_AMBULATORY_CARE_PROVIDER_SITE_OTHER)
Admission: RE | Admit: 2019-01-11 | Discharge: 2019-01-11 | Disposition: A | Discharge status: Home / Self Care | Payer: Medicare HMO | Source: Ambulatory Visit | Attending: Acute Care | Admitting: Acute Care

## 2019-01-11 ENCOUNTER — Ambulatory Visit: Payer: Medicare HMO | Admitting: Physical Therapy

## 2019-01-11 ENCOUNTER — Ambulatory Visit (INDEPENDENT_AMBULATORY_CARE_PROVIDER_SITE_OTHER): Payer: Medicare HMO | Admitting: Acute Care

## 2019-01-11 VITALS — BP 102/58 | HR 85 | Ht 62.0 in | Wt 169.0 lb

## 2019-01-11 VITALS — BP 120/60 | HR 96 | Ht 62.0 in | Wt 168.6 lb

## 2019-01-11 DIAGNOSIS — M5441 Lumbago with sciatica, right side: Secondary | ICD-10-CM | POA: Diagnosis not present

## 2019-01-11 DIAGNOSIS — K589 Irritable bowel syndrome without diarrhea: Secondary | ICD-10-CM | POA: Diagnosis not present

## 2019-01-11 DIAGNOSIS — F17210 Nicotine dependence, cigarettes, uncomplicated: Secondary | ICD-10-CM | POA: Diagnosis not present

## 2019-01-11 DIAGNOSIS — G8929 Other chronic pain: Secondary | ICD-10-CM

## 2019-01-11 DIAGNOSIS — M5442 Lumbago with sciatica, left side: Secondary | ICD-10-CM | POA: Diagnosis not present

## 2019-01-11 DIAGNOSIS — M6281 Muscle weakness (generalized): Secondary | ICD-10-CM

## 2019-01-11 DIAGNOSIS — K52832 Lymphocytic colitis: Secondary | ICD-10-CM | POA: Diagnosis not present

## 2019-01-11 DIAGNOSIS — Z87891 Personal history of nicotine dependence: Secondary | ICD-10-CM | POA: Diagnosis not present

## 2019-01-11 DIAGNOSIS — Z122 Encounter for screening for malignant neoplasm of respiratory organs: Secondary | ICD-10-CM

## 2019-01-11 NOTE — Therapy (Addendum)
[facility] [address], [place], [place], [address] Phone: [phone]   Fax:  [phone]  Physical Therapy Treatment  Patient Details  Name: Tonya Bradley MRN: [id number] Date of Birth: 09-18-1951 Referring Provider (PT): Letta Pate Luanna Salk, MD   Encounter Date: 01/11/2019  PT End of Session - 01/11/19 1403    Visit Number  5    Date for PT Re-Evaluation  02/01/19    Authorization Type  [employer]    Authorization Time Period  12/07/18-05/08/19    PT Start Time  0157    PT Stop Time  0236    PT Time Calculation (min)  39 min    Activity Tolerance  Patient limited by pain;Patient tolerated treatment well    Behavior During Therapy  [facility] for tasks assessed/performed       Past Medical History:  Diagnosis Date  . Anemia   . Anxiety   . Arthritis    "back"  . Atrial flutter ([place]) 03/13/2016  . CAD (coronary artery disease)   . Cancer ([place])    skin cancer  . Cataract   . Colon polyp   . COPD (chronic obstructive pulmonary disease) ([place])   . Depression   . Dysrhythmia   . Emphysema of lung ([place])   . GERD (gastroesophageal reflux disease)   . Hypercholesterolemia   . Osteoporosis   . Oxygen deficiency     Past Surgical History:  Procedure Laterality Date  . A-FLUTTER ABLATION N/A 05/19/2017   Procedure: A-Flutter Ablation;  Surgeon: Evans Lance, MD;  Location: [place] [facility];  Service: Cardiovascular;  Laterality: N/A;  . APPENDECTOMY    . BASAL CELL CARCINOMA EXCISION    . CARDIAC CATHETERIZATION    . CATARACT EXTRACTION Bilateral   . CHOLECYSTECTOMY    . COLONOSCOPY N/A 06/23/2014   Dr. Rourk:Rectal and colonic polyps-removed, Pancolonic diverticulosis. lymphocytic colitis  . CORONARY ANGIOPLASTY WITH STENT PLACEMENT    . ESOPHAGEAL DILATION N/A 03/13/2016   Procedure: ESOPHAGEAL DILATION;  Surgeon: Daneil Dolin, MD;  [facility];  Service: Endoscopy;  Laterality: N/A;  .  ESOPHAGOGASTRODUODENOSCOPY N/A 03/13/2016   Procedure: ESOPHAGOGASTRODUODENOSCOPY (EGD);  Surgeon: Daneil Dolin, MD;  [facility];  Service: Endoscopy;  Laterality: N/A;  0930  . EYE SURGERY     cataract removed, bilateral  . LUMBAR LAMINECTOMY/DECOMPRESSION MICRODISCECTOMY N/A 01/19/2018   Procedure: L4-5 DECOMPRESSION;  Surgeon: Marybelle Killings, MD;  Location: [facility];  Service: Orthopedics;  Laterality: N/A;    There were no vitals filed for this visit.  Subjective Assessment - 01/11/19 1401    Subjective  Pt has been in the hospital with infectious colitis.  Has been miserable.  Lots of doctor appointments since getting dishcarged from hospital.  Achey all over today.    Pertinent History  Pt uses oxygen (supposed to be 24/7 although she doesn't always use it)    Limitations  Standing;Sitting;Walking;House hold activities;Lifting    How long can you sit comfortably?  1 hour    How long can you stand comfortably?  1 hour    How long can you walk comfortably?  several blocks 4x/day (20 min walk)    Patient Stated Goals  put on panty hose, shoes, boots    Currently in Pain?  Yes    Pain Score  4     Pain Location  Back    Pain Orientation  Right;Left;Lower    Pain Descriptors /  Indicators  Sore    Pain Type  Chronic pain    Pain Onset  More than a month ago    Pain Frequency  Constant                       [facility] Adult PT Treatment/Exercise - 01/11/19 0001      Exercises   Exercises  Knee/Hip;Lumbar      Lumbar Exercises: Stretches   Active Hamstring Stretch  Right;Left;2 reps;30 seconds    Active Hamstring Stretch Limitations  seated    Lower Trunk Rotation  10 seconds;5 reps    Gastroc Stretch  Right;Left;2 reps;30 seconds    Gastroc Stretch Limitations  slant board    Other Lumbar Stretch Exercise  hip flexor stretch, foot on second stair, lunge forward    Other Lumbar Stretch Exercise  seated blue ball rollouts for lumbar flexion/stretch x 1'       Lumbar Exercises: Aerobic   Nustep  L2 x 6', x 5' round two   PT present to discuss symptoms/progress     Lumbar Exercises: Supine   Bridge  20 reps      Knee/Hip Exercises: Standing   Heel Raises  Both;20 reps    Hip Extension  AROM;Stengthening;Both;1 set;10 reps;Knee straight      Knee/Hip Exercises: Supine   Straight Leg Raises  Both;2 sets;5 reps          Balance Exercises - 01/11/19 1413      Balance Exercises: Standing   SLS  Eyes open;Upper extremity support 2;Intermittent upper extremity support;2 reps;30 secs   Lt SLS 2 UE support, Rt LE intermittent UE support         PT Short Term Goals - 01/11/19 1430      PT SHORT TERM GOAL #1   Title  Pt will be ind in initial HEP    Status  Achieved      PT SHORT TERM GOAL #2   Title  Pt will demonstrate proper bed mobility and sit to stand transfers with use of core and proper body mechanics with use of UEs as needed.    Status  Achieved      PT SHORT TERM GOAL #3   Title  Pt will be able to perform bil SLS with UE use prn for 15 seconds with eyes open.    Status  Partially Met   met for Rt LE     PT SHORT TERM GOAL #4   Title  Pt will report greater ease of donning shoes, socks, and panty hose by at least 20%.    Status  Achieved        PT Long Term Goals - 01/11/19 1404      PT LONG TERM GOAL #1   Title  Pt will be ind in advanced HEP.    Status  On-going      PT LONG TERM GOAL #3   Title  Pt will achieve strength rating of at least 4/5 for all LE muscle groups so that she may perform more physical activities throughout the day.    Status  On-going      PT LONG TERM GOAL #4   Title  Pt will be able to perform bil SLS with UE support as needed for at least 20 sec demonstrating less fall risk and improved safety.    Status  Partially Met   met for Rt LE     PT LONG TERM GOAL #  5   Title  Pt will report improved ease of donning shoes, socks, and panty hose by at least 30%    Status  On-going             Plan - 01/11/19 1432    Clinical Impression Statement  Pt was recently hospitalized with significant GI symptoms.  She reported low energy and achiness all over today.  She feels she may have gone backwards in progress she had made in PT due to getting sick.    PT noted Pt met balance goal through Rt LE with ability to balance with eyes open without UE support x 20 sec today.  Pt requires UE support for Lt LE.  Pt tolerated ther ex well as PT varied positions and alternated ther ex and stretching for Pt to get breaks.  She will continue to benefit from skilled PT along POC to address balance, ROM, flexibility and strength deficits to improve functional activity tolerance throughout the day.      Clinical Impairments Affecting Rehab Potential  poor pulmonary health/endurance, uses oxygen    PT Frequency  2x / week    PT Duration  8 weeks    PT Treatment/Interventions  ADLs/Self Care Home Management;Electrical Stimulation;Moist Heat;Traction;Gait training;Functional mobility training;Therapeutic activities;Therapeutic exercise;Balance training;Neuromuscular re-education;Patient/family education;Manual techniques;Passive range of motion;Dry needling;Taping;Spinal Manipulations;Joint Manipulations    PT Next Visit Plan  update HEP, f/u on gym use, gentle mobility for trunk and hips as tolerated, LE strength, balance training, core, modalities for pain    PT Home Exercise Plan  Access Code: [email or web address]       Patient will benefit from skilled therapeutic intervention in order to improve the following deficits and impairments:  Abnormal gait, Decreased endurance, Hypomobility, Cardiopulmonary status limiting activity, Impaired tone, Decreased activity tolerance, Decreased strength, Pain, Decreased balance, Decreased mobility, Difficulty walking, Postural dysfunction, Improper body mechanics, Decreased range of motion  Visit Diagnosis: Chronic bilateral low back pain with bilateral  sciatica  Muscle weakness (generalized)     Problem List Patient Active Problem List   Diagnosis Date Noted  . Colitis 01/01/2019  . Dehydration 01/01/2019  . Hypokalemia 01/01/2019  . Dysphonia 06/19/2018  . Bilateral lower extremity edema 02/16/2018  . Lumbar stenosis 01/19/2018  . GERD (gastroesophageal reflux disease) 11/18/2017  . 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 COPD (chronic obstructive pulmonary disease) ([place]) 09/09/2017  . Varicose veins of bilateral lower extremities with other complications 69/48/5462  . Chronic pain disorder 05/26/2017  . Back pain 05/26/2017  . Insomnia 05/26/2017  . Osteopenia 11/26/2016  . Other emphysema ([place]) 11/07/2016  . Chronic anxiety 11/07/2016  . Chronic hypoxemic respiratory failure ([employer]) 11/07/2016  . Constipation 06/12/2016  . Right carotid bruit 04/03/2016  . CAD (coronary artery disease) 04/03/2016  . Tobacco abuse 04/03/2016  . Atrial flutter ([place]) 03/13/2016  . Hiatal hernia   . Reflux esophagitis   . Schatzki's ring   . Dysphagia 03/01/2016  . Solitary pulmonary nodule 03/01/2016  . 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 Microscopic colitis 08/15/2014  . Loose stools 06/08/2014  . BCC (basal cell carcinoma), face 09/13/2012    Baruch Merl, PT 01/11/19 2:33 PM  PHYSICAL THERAPY DISCHARGE SUMMARY  Visits from Start of Care: 5  Current functional level related to goals / functional outcomes: See above   Remaining deficits: See above   Education / Equipment: HEP Plan: Patient agrees to discharge.  Patient goals were partially met. Patient is being discharged due to  not returning since the last visit.  ?????        Baruch Merl, PT 04/02/19 7:04 AM   [facility] [address] [place], [place], [address] Phone: [phone]   Fax:  [phone]  Name: Tonya Bradley MRN: [id number] Date  of Birth: Jul 13, 1951

## 2019-01-11 NOTE — Patient Instructions (Addendum)
Toileting tips to help with your constipation  - Drink plenty of water - at least 64 ounces daily.  - Add a daily Probiotic. Align is my preference, but, the specific brand is not as important.   - Take Miralax every morning. Repeat at noon if no morning bowel movement.   - Establish a time to try to move your bowels every day. For many people, this is after a cup of coffee.  - Sit all of the way back on the toilet keeping your back fairly straight and lean forward bending from your hips, resting your forearms on your thighs. Raising your feet with a step stool can be helpful.  - Relax the rectum feeling it bulge toward the toilet water. If you feel your rectum raising toward your body, you are contracting rather than relaxing.  - Breathe in and slowly exhale. "Belly breath" by expanding your belly towards your belly button. Keep belly expanded as you gently direct pressure down and back to the anus. A low pitched GRRR sound can assist with increasing intra-abdominal pressure.   - Repeat 3-4 times. If unsuccessful, contract the pelvic floor to restore normal tone and get off the toilet. Avoid excessive straining.  - To reduce excessive wiping by teaching your anus to normally contract, place hands on outer aspect of knees and resist knee movement outward. Hold 5-10 second then place hands just inside of knees and resist inward movement of knees. Hold 5 seconds. Repeat a few times each way.

## 2019-01-11 NOTE — Progress Notes (Signed)
Referring Provider: Martinique, [name], MD Primary Care Physician:  Martinique, [name], MD   Reason for Consultation: Abdominal pain   IMPRESSION:  Change in bowel habits, abdominal pain and cramping Recent hospitalization for infectious colitis Lymphocytic colitis on colonoscopy April 2015 with Dr. Gala Romney    -Initially presented with 4 to 5 months of diarrhea    -Initially improved on Pepto-Bismol    -Diarrhea exacerbation treated with budesonide April 2019 Chronic, intermittent constipation requiring Linzess as needed Schatzki's ring s/p dilation on EGD 2014 with Dr. Gala Romney Pancolonic diverticulosis Cholecystectomy Chronic anxiety  ? Postinfectious IBS, overlapping SIBO, as well as unmasked IBD  PLAN: Increase water in the diet Continue Linzess Add Miralax 17 g QD to BID Add a daily probiotic (Align recommended) Return to clinic in 2-3 months   HPI: Tonya Bradley is a 68 y.o. female seen in hospital follow-up for abdominal pain. Her husband accompanies her to this appointment.  She has been previously followed by Drs. Buford Dresser and Ardis Hughs.  She was last seen in our office May 29, 2018.  I have summarized her prior history and the problem list above.  She has a history of lymphocytic colitis, although experiences constipation requiring Linzess more often than diarrhea.  Recently hospitalized for infectious colitis secondary to Campylobacter and enteropathogenic E. Coli.   Bloody diarrhea has since resolved.  CT of the abdomen and pelvis with contrast 01/01/2019 showed mild cardiomegaly, atelectasis, prior cholecystectomy, transmural thickening of the colon from the distal transverse rectum, scattered colonic diverticulosis, and no small bowel dilation.  There is a tiny periumbilical fat-containing hernia.No ascites or lymph nodes.  There is aortoiliac and branch vessel atherosclerosis.  Since discharge she has been constipated despite Linzess. Her last BM was 4 days ago.   She reports a  longstanding history of alternating diarrhea and constipation 50/50. When constipated, she will have a BM every 2 days.  Her PCP put her on Linzess.  Previously used ExLax. Tried Miralax and Benefiber without success (>7 months ago).   Can't eat. Feels "upset" all of the time. Cramping occurs when she tries to defecate.  Husband says she hasn't felt well for one month. He is tired of her being cranky from not feeling well.   PCP paced her on a soft diet. Does not drink water. She has been craving grape juice since her hospitalization.  No other associated symptoms.  No identified exacerbating or relieving features.  Past Medical History:  Diagnosis Date  . Anemia   . Anxiety   . Arthritis    "back"  . Atrial flutter ([place]) 03/13/2016  . CAD (coronary artery disease)   . Cancer ([place])    skin cancer  . Cataract   . Colon polyp   . COPD (chronic obstructive pulmonary disease) ([place])   . Depression   . Dysrhythmia   . Emphysema of lung ([place])   . GERD (gastroesophageal reflux disease)   . Hypercholesterolemia   . Osteoporosis   . Oxygen deficiency     Past Surgical History:  Procedure Laterality Date  . A-FLUTTER ABLATION N/A 05/19/2017   Procedure: A-Flutter Ablation;  Surgeon: Evans Lance, MD;  Location: [facility];  Service: Cardiovascular;  Laterality: N/A;  . APPENDECTOMY    . BASAL CELL CARCINOMA EXCISION    . CARDIAC CATHETERIZATION    . CATARACT EXTRACTION Bilateral   . CHOLECYSTECTOMY    . COLONOSCOPY N/A 06/23/2014   Dr. Rourk:Rectal and colonic polyps-removed, Pancolonic diverticulosis.  lymphocytic colitis  . CORONARY ANGIOPLASTY WITH STENT PLACEMENT    . ESOPHAGEAL DILATION N/A 03/13/2016   Procedure: ESOPHAGEAL DILATION;  Surgeon: Daneil Dolin, MD;  [facility];  Service: Endoscopy;  Laterality: N/A;  . ESOPHAGOGASTRODUODENOSCOPY N/A 03/13/2016   Procedure: ESOPHAGOGASTRODUODENOSCOPY (EGD);  Surgeon: Daneil Dolin, MD;  [facility];   Service: Endoscopy;  Laterality: N/A;  0930  . EYE SURGERY     cataract removed, bilateral  . LUMBAR LAMINECTOMY/DECOMPRESSION MICRODISCECTOMY N/A 01/19/2018   Procedure: L4-5 DECOMPRESSION;  Surgeon: Marybelle Killings, MD;  Location: [facility];  Service: Orthopedics;  Laterality: N/A;    Current Outpatient Medications  Medication Sig Dispense Refill  . ALPRAZolam (XANAX) 0.5 MG tablet TAKE 1 TABLET BY MOUTH TWICE DAILY AS NEEDED FOR ANXIETY; MUST LAST 30 DAYS 40 tablet 1  . amiodarone (PACERONE) 200 MG tablet Take 1/2 tablet (100 mg) by mouth once daily (Patient taking differently: Take 100 mg by mouth daily. Take 1/2 tablet (100 mg) by mouth once daily) 90 tablet 3  . aspirin EC 81 MG tablet Take 81 mg by mouth daily.    Marland Kitchen atorvastatin (LIPITOR) 80 MG tablet TAKE 1 TABLET(80 MG) BY MOUTH DAILY 90 tablet 1  . Cholecalciferol (VITAMIN D) 2000 units CAPS Take 2,000 Units by mouth daily.    . cycloSPORINE (RESTASIS) 0.05 % ophthalmic emulsion Place 1 drop into both eyes 2 (two) times daily.    Marland Kitchen DEXILANT 30 MG capsule TAKE 1 CAPSULE(30 MG) BY MOUTH DAILY 90 capsule 2  . diltiazem (CARDIZEM CD) 120 MG 24 hr capsule TAKE 1 CAPSULE(120 MG) BY MOUTH DAILY 90 capsule 2  . DULoxetine (CYMBALTA) 60 MG capsule Take 1 capsule (60 mg total) by mouth daily. (Patient taking differently: Take 60 mg by mouth at bedtime. ) 90 capsule 1  . furosemide (LASIX) 20 MG tablet 1 tab 3 times per day. 90 tablet 3  . LINZESS 290 MCG CAPS capsule TAKE 1 CAPSULE(290 MCG) BY MOUTH DAILY BEFORE BREAKFAST 30 capsule 0  . Multiple Vitamin (MULTIVITAMIN) tablet Take 1 tablet by mouth daily.    . nitroGLYCERIN (NITROSTAT) 0.4 MG SL tablet Place 1 tablet (0.4 mg total) under the tongue every 5 (five) minutes as needed for chest pain. Reported on 06/12/2016 30 tablet 2  . ondansetron (ZOFRAN) 4 MG tablet Take 1 tablet (4 mg total) by mouth every 8 (eight) hours as needed for up to 5 days for nausea. 15 tablet 0  . oxyCODONE (OXY  IR/ROXICODONE) 5 MG immediate release tablet Take 1 tablet (5 mg total) by mouth every 6 (six) hours as needed for severe pain. 30 tablet 0  . potassium chloride (K-DUR) 10 MEQ tablet TAKE 1 TABLET(10 MEQ) BY MOUTH DAILY 90 tablet 2  . rOPINIRole (REQUIP) 0.25 MG tablet Take 2 tablets (0.5 mg total) by mouth at bedtime. 180 tablet 0  . terbinafine (LAMISIL) 250 MG tablet Take 250 mg by mouth daily.    . TRELEGY ELLIPTA 100-62.5-25 MCG/INH AEPB INHALE 1 PUFF INTO THE LUNGS DAILY 60 each 3  . VENTOLIN HFA 108 (90 Base) MCG/ACT inhaler INHALE 2 PUFFS INTO THE LUNGS EVERY 6 HOURS AS NEEDED FOR WHEEZING OR SHORTNESS OF BREATH 18 g 0   No current facility-administered medications for this visit.     Allergies as of 01/11/2019  . (No Known Allergies)    Family History  Problem Relation Age of Onset  . Breast cancer Sister 12  . Heart disease  Sister   . Heart attack Father   . Heart disease Father   . Stroke Mother   . Heart disease Mother   . Hyperlipidemia Mother   . Heart attack Sister   . Heart disease Brother        ?heart failure  . Congestive Heart Failure Brother   . Hypertension Son   . Heart disease Maternal Grandfather   . Cancer Cousin        lymphoma  . Hypertension Son   . Stomach cancer Other   . Colon cancer Neg Hx   . Pancreatic cancer Neg Hx     Social History   Socioeconomic History  . Marital status: Married    Spouse name: Not on file  . Number of children: 3  . Years of education: 10  . Highest education level: Not on file  Occupational History  . Occupation: retired    Comment: customer service [employer]  . Financial resource strain: Not on file  . Food insecurity:    Worry: Not on file    Inability: Not on file  . Transportation needs:    Medical: Not on file    Non-medical: Not on file  Tobacco Use  . Smoking status: Current Every Day Smoker    Packs/day: 1.50    Years: 45.00    Pack years: 67.50    Types: Cigarettes    Start  date: 06/14/1966  . Smokeless tobacco: Never Used  . Tobacco comment: 1-2 packs daily  Substance and Sexual Activity  . Alcohol use: No    Alcohol/week: 0.0 standard drinks  . Drug use: No  . Sexual activity: Yes    Birth control/protection: Post-menopausal  Lifestyle  . Physical activity:    Days per week: Not on file    Minutes per session: Not on file  . Stress: Not on file  Relationships  . Social connections:    Talks on phone: Not on file    Gets together: Not on file    Attends religious service: Not on file    Active member of club or organization: Not on file    Attends meetings of clubs or organizations: Not on file    Relationship status: Not on file  . Intimate partner violence:    Fear of current or ex partner: Not on file    Emotionally abused: Not on file    Physically abused: Not on file    Forced sexual activity: Not on file  Other Topics Concern  . Not on file  Social History Narrative   Quit high school and got married in 10th grade, age 14   First husband died of colon cancer at young age   Married again 2018   Lives in own home   Daughter lives with her   Drinks coffee seldom, too much "sweet tea"    Review of Systems: 12 system ROS is negative except as noted above.  Filed Weights   01/11/19 0836  Weight: 169 lb (76.7 kg)    Physical Exam: Vital signs were reviewed. General:   Alert, well-nourished, pleasant and cooperative in NAD Head:  Normocephalic and atraumatic. Eyes:  Sclera clear, no icterus.   Conjunctiva pink. Mouth:  No deformity or lesions.   Neck:  Supple; no thyromegaly. Lungs:  Clear throughout to auscultation.   No wheezes.  Heart:  Regular rate and rhythm; no murmurs Abdomen:  Soft, nontender, normal bowel sounds. No rebound or guarding. No  hepatosplenomegaly Rectal:  Deferred  Msk:  Symmetrical without gross deformities. Extremities:  No gross deformities or edema. Neurologic:  Alert and  oriented x4;  grossly  nonfocal Skin:  No rash or bruise. Psych:  Alert and cooperative. Normal mood and affect.   Dorie Ohms L. Tarri Glenn, MD, MPH [facility] 01/11/2019, 12:56 PM

## 2019-01-11 NOTE — Progress Notes (Signed)
Shared Decision Making Visit Lung Cancer Screening Program [phone])   Eligibility:  Age 68 y.o.  Pack Years Smoking History Calculation 81 pack year smoking history (# packs/per year x # years smoked)  Recent History of coughing up blood  no  Unexplained weight loss? no ( >Than 15 pounds within the last 6 months )  Prior History Lung / other cancer no (Diagnosis within the last 5 years already requiring surveillance chest CT Scans).  Smoking Status Current Smoker  Former Smokers: Years since quit: NA  Quit Date: NA  Visit Components:  Discussion included one or more decision making aids. yes  Discussion included risk/benefits of screening. yes  Discussion included potential follow up diagnostic testing for abnormal scans. yes  Discussion included meaning and risk of over diagnosis. yes  Discussion included meaning and risk of False Positives. yes  Discussion included meaning of total radiation exposure. yes  Counseling Included:  Importance of adherence to annual lung cancer LDCT screening. yes  Impact of comorbidities on ability to participate in the program. yes  Ability and willingness to under diagnostic treatment. yes  Smoking Cessation Counseling:  Current Smokers:   Discussed importance of smoking cessation. yes  Information about tobacco cessation classes and interventions provided to patient. yes  Patient provided with "ticket" for LDCT Scan. yes  Symptomatic Patient. no  Counseling  Diagnosis Code: Tobacco Use Z72.0  Asymptomatic Patient yes  Counseling (Intermediate counseling: > three minutes counseling) [id number]  Former Smokers:   Discussed the importance of maintaining cigarette abstinence. yes  Diagnosis Code: Personal History of Nicotine Dependence. L37.342  Information about tobacco cessation classes and interventions provided to patient. Yes  Patient provided with "ticket" for LDCT Scan. yes  Written Order for Lung Cancer  Screening with LDCT placed in Epic. Yes (CT Chest Lung Cancer Screening Low Dose W/O CM) [id number] Z12.2-Screening of respiratory organs Z87.891-Personal history of nicotine dependence  I have spent 25 minutes of face to face time with Ms. Wambolt discussing the risks and benefits of lung cancer screening. We viewed a power point together that explained in detail the above noted topics. We paused at intervals to allow for questions to be asked and answered to ensure understanding.We discussed that the single most powerful action that she can take to decrease her risk of developing lung cancer is to quit smoking. We discussed whether or not she is ready to commit to setting a quit date. We discussed options for tools to aid in quitting smoking including nicotine replacement therapy, non-nicotine medications, support groups, Quit Smart classes, and behavior modification. We discussed that often times setting smaller, more achievable goals, such as eliminating 1 cigarette a day for a week and then 2 cigarettes a day for a week can be helpful in slowly decreasing the number of cigarettes smoked. This allows for a sense of accomplishment as well as providing a clinical benefit. I gave her the " Be Stronger Than Your Excuses" card with contact information for community resources, classes, free nicotine replacement therapy, and access to mobile apps, text messaging, and on-line smoking cessation help. I have also given her my card and contact information in the event she needs to contact me. We discussed the time and location of the scan, and that either Doroteo Glassman RN or I will call with the results within 24-48 hours of receiving them. I have offered her  a copy of the power point we viewed  as a resource in the event they need reinforcement of  the concepts we discussed today in the office. The patient verbalized understanding of all of  the above and had no further questions upon leaving the office. They have my  contact information in the event they have any further questions.  I spent 4 minutes counseling on smoking cessation and the health risks of continued tobacco abuse.  I explained to the patient that there has been a high incidence of coronary artery disease noted on these exams. I explained that this is a non-gated exam therefore degree or severity cannot be determined. This patient is currently on statin therapy. I have asked the patient to follow-up with their PCP regarding any incidental finding of coronary artery disease and management with diet or medication as their PCP  feels is clinically indicated. The patient verbalized understanding of the above and had no further questions upon completion of the visit.      Magdalen Spatz, NP 01/11/2019 3:48 PM

## 2019-01-12 ENCOUNTER — Encounter: Payer: Self-pay | Admitting: Physical Medicine & Rehabilitation

## 2019-01-12 ENCOUNTER — Encounter: Payer: Medicare HMO | Attending: Physical Medicine & Rehabilitation

## 2019-01-12 ENCOUNTER — Telehealth: Payer: Self-pay | Admitting: Acute Care

## 2019-01-12 ENCOUNTER — Ambulatory Visit (HOSPITAL_BASED_OUTPATIENT_CLINIC_OR_DEPARTMENT_OTHER): Payer: Medicare HMO | Admitting: Physical Medicine & Rehabilitation

## 2019-01-12 VITALS — BP 117/69 | HR 85 | Ht 62.0 in | Wt 168.0 lb

## 2019-01-12 DIAGNOSIS — M48061 Spinal stenosis, lumbar region without neurogenic claudication: Secondary | ICD-10-CM | POA: Diagnosis not present

## 2019-01-12 DIAGNOSIS — M47816 Spondylosis without myelopathy or radiculopathy, lumbar region: Secondary | ICD-10-CM | POA: Diagnosis not present

## 2019-01-12 DIAGNOSIS — G8929 Other chronic pain: Secondary | ICD-10-CM | POA: Diagnosis present

## 2019-01-12 DIAGNOSIS — M961 Postlaminectomy syndrome, not elsewhere classified: Secondary | ICD-10-CM | POA: Diagnosis not present

## 2019-01-12 NOTE — Telephone Encounter (Signed)
Will call these results to the patient tomorrow

## 2019-01-12 NOTE — Progress Notes (Signed)
Right lumbar L3, L4 medial branch blocks and L5 dorsal ramus injection under fluoroscopic guidance  Indication: Right Lumbar pain which is not relieved by medication management or other conservative care and interfering with self-care and mobility.  Informed consent was obtained after describing risks and benefits of the procedure with the patient, this includes bleeding, bruising, infection, paralysis and medication side effects. The patient wishes to proceed and has given written consent. The patient was placed in a prone position. The lumbar area was marked and prepped with Betadine. One ML of 1% lidocaine was injected into each of 3 areas into the skin and subcutaneous tissue. Then a 22-gauge 3.5in spinal needle was inserted targeting the junction of the Right S1 superior articular process and sacral ala junction. Needle was advanced under fluoroscopic guidance. Bone contact was made.Isovue 200 was injected x0.5 mL demonstrating no intravascular uptake. Then a solution containing 2% MPF lidocaine was injected x0.5 mL. Then the Right L5 superior articular process in transverse process junction was targeted. Bone contact was made.Isovue 200 was injected x0.5 mL demonstrating no intravascular uptake. Then a solution containing 2% MPF lidocaine was injected x0.5 mL. Then the Right L4 superior articular process in transverse process junction was targeted. Bone contact was made. Isovue 200 was injected x0.5 mL demonstrating no intravascular uptake. Then a solution containing2% MPF lidocaine was injected x0.5 mL Patient tolerated procedure well. Post procedure instructions were given. Please refer to post procedure form.

## 2019-01-12 NOTE — Patient Instructions (Signed)

## 2019-01-12 NOTE — Progress Notes (Signed)
  PROCEDURE RECORD [facility]   Name: Tonya Bradley DOB:15-Mar-1951 MRN: [id number]  Date:01/12/2019  Physician: Alysia Penna, MD    Nurse/CMA: Bright CMA  Allergies: No Known Allergies  Consent Signed: Yes.    Is patient diabetic? No.  CBG today? NA  Pregnant: No. LMP: No LMP recorded. Patient is postmenopausal. (age [age])  Anticoagulants: no Anti-inflammatory: no Antibiotics: no  Procedure: Right L3-5 Medial Branch Blocks Position: Prone   Start Time: 12:25pm End Time: 12:30pm Fluoro Time: 25s  RN/CMA Bright CMA BrightCMA    Time 1150am 1236pm    BP 117/69 110/66    Pulse 85 83    Respirations 16 16    O2 Sat 88 88    S/S 6 6    Pain Level 5/10 0/10     D/C home with Husband, [name] & O X 3, D/C instructions reviewed, and sits independently.

## 2019-01-12 NOTE — Telephone Encounter (Signed)
Received call report from [facility] with [facility] on patient's low dose lung screening CT done on 01/11/19. Sela Hilding, NP, please review the result/impression copied below:  IMPRESSION: 1. Lung-RADS 4B, suspicious. Additional imaging evaluation or consultation with Pulmonology or Thoracic Surgery recommended. One bilobed versus 2 adjacent right upper lobe pulmonary nodule/nodules, new. 2. Aortic atherosclerosis (ICD10-I70.0), coronary artery atherosclerosis and emphysema (ICD10-J43.9).  These results will be called to the ordering clinician or representative by the Radiologist Assistant, and communication documented in the PACS or zVision Dashboard.     Please advise, thank you.

## 2019-01-13 ENCOUNTER — Encounter: Payer: Medicare HMO | Admitting: Physical Therapy

## 2019-01-13 ENCOUNTER — Other Ambulatory Visit: Payer: Self-pay | Admitting: Acute Care

## 2019-01-13 DIAGNOSIS — R918 Other nonspecific abnormal finding of lung field: Secondary | ICD-10-CM

## 2019-01-14 ENCOUNTER — Telehealth: Payer: Self-pay | Admitting: Family Medicine

## 2019-01-14 NOTE — Telephone Encounter (Signed)
See result note 01/13/2019.  Results faxed to PCP.

## 2019-01-14 NOTE — Telephone Encounter (Signed)
Copied from [place] [phone]. Topic: General - Other >> Jan 14, 2019 10:59 AM Lennox Solders wrote:  Reason for CRM: pt was dx with lung cancer yesterday and pt is taking 0.5 mg alprazolam. Pt would like new rx  1.0 mg alprazolam .  Pt still has about 10 pills of 0.5 mg. [facility] [employer]. Pt is sch for PET SCAN soon

## 2019-01-15 ENCOUNTER — Ambulatory Visit
Admission: RE | Admit: 2019-01-15 | Discharge: 2019-01-15 | Disposition: A | Discharge status: Home / Self Care | Payer: Medicare HMO | Source: Ambulatory Visit | Attending: Acute Care | Admitting: Acute Care

## 2019-01-15 ENCOUNTER — Encounter: Payer: Self-pay | Admitting: Family Medicine

## 2019-01-15 DIAGNOSIS — R918 Other nonspecific abnormal finding of lung field: Secondary | ICD-10-CM | POA: Diagnosis not present

## 2019-01-15 DIAGNOSIS — R911 Solitary pulmonary nodule: Secondary | ICD-10-CM | POA: Diagnosis not present

## 2019-01-15 LAB — GLUCOSE, CAPILLARY: GLUCOSE-CAPILLARY: 101 mg/dL — AB (ref 70–99)

## 2019-01-15 MED ORDER — FLUDEOXYGLUCOSE F - 18 (FDG) INJECTION
8.7000 | Freq: Once | INTRAVENOUS | Status: AC | PRN
  Administered 2019-01-15: 9.05 via INTRAVENOUS

## 2019-01-15 NOTE — Telephone Encounter (Signed)
Left vm for patient to return call to clinic concerning medication. CRM created, if patient return call, [place] may give recommendation per Dr. Martinique.

## 2019-01-15 NOTE — Telephone Encounter (Signed)
This encounter was created in error - please disregard.

## 2019-01-15 NOTE — Telephone Encounter (Signed)
Message from Dr. Martinique read to patient. States she was told results were sent to PCP. States she was "Told by girl at office yesterday she could she they were sent." Assured [place] would sent her concerns to Dr. Martinique, reiterated need for OV to discuss options.

## 2019-01-15 NOTE — Telephone Encounter (Signed)
So far I have not received notes from provider who diagnosed her with "lung cancer." For now I do not feel comfortable increasing the dose of alprazolam, we had this discussion in the past. After she sees oncologist, I can see her here in the office and we can discuss options.  Thanks, BJ

## 2019-01-15 NOTE — Telephone Encounter (Signed)
Message sent to Dr. Jordan for review and approval. 

## 2019-01-17 ENCOUNTER — Telehealth: Payer: Self-pay | Admitting: Pulmonary Disease

## 2019-01-17 NOTE — Telephone Encounter (Signed)
PCCM:  Asked to review images by Eric Form, NP. Recent PET imaging is concerning.  Please schedule an appointment with me this week to discuss next best steps.   Garner Nash, DO [facility] 01/17/2019 9:09 AM

## 2019-01-18 ENCOUNTER — Telehealth: Payer: Self-pay

## 2019-01-18 ENCOUNTER — Encounter: Payer: Medicare HMO | Admitting: Physical Therapy

## 2019-01-18 NOTE — Telephone Encounter (Signed)
Message sent to Dr. Jordan for review. 

## 2019-01-18 NOTE — Telephone Encounter (Signed)
Call made to patient, appointment made for 02/18. Nothing further needed at this time.

## 2019-01-19 ENCOUNTER — Ambulatory Visit (INDEPENDENT_AMBULATORY_CARE_PROVIDER_SITE_OTHER): Payer: Medicare HMO | Admitting: Pulmonary Disease

## 2019-01-19 ENCOUNTER — Encounter: Payer: Self-pay | Admitting: Pulmonary Disease

## 2019-01-19 ENCOUNTER — Telehealth: Payer: Self-pay | Admitting: Family Medicine

## 2019-01-19 VITALS — BP 118/60 | HR 130 | Ht 62.0 in | Wt 162.0 lb

## 2019-01-19 DIAGNOSIS — J439 Emphysema, unspecified: Secondary | ICD-10-CM | POA: Diagnosis not present

## 2019-01-19 DIAGNOSIS — R942 Abnormal results of pulmonary function studies: Secondary | ICD-10-CM | POA: Diagnosis not present

## 2019-01-19 DIAGNOSIS — F17200 Nicotine dependence, unspecified, uncomplicated: Secondary | ICD-10-CM | POA: Diagnosis not present

## 2019-01-19 DIAGNOSIS — R911 Solitary pulmonary nodule: Secondary | ICD-10-CM

## 2019-01-19 DIAGNOSIS — J984 Other disorders of lung: Secondary | ICD-10-CM | POA: Diagnosis not present

## 2019-01-19 MED ORDER — VARENICLINE TARTRATE 0.5 MG X 11 & 1 MG X 42 PO MISC: ORAL | 0 refills | Status: DC

## 2019-01-19 NOTE — Patient Instructions (Signed)
Thank you for visiting Dr. Valeta Harms at [facility]. Today we recommend the following: Orders Placed This Encounter  Procedures  . Ambulatory referral to Cardiothoracic Surgery    Please schedule patient with Dr. Lamonte Sakai first available.

## 2019-01-19 NOTE — Telephone Encounter (Signed)
Spoke with patient and advised patient per Dr. Martinique that she would like for her to come in for an OV before increase in medication. Patient verbalized understanding. Patient coming in on 01/20/2019 at 2:45 pm.

## 2019-01-19 NOTE — Telephone Encounter (Signed)
Message sent to Dr. Jordan for review and approval. 

## 2019-01-19 NOTE — Telephone Encounter (Signed)
Copied from [place] [id number]. Topic: Quick Communication - See Telephone Encounter >> Jan 19, 2019  2:47 PM Bea Graff, NT wrote: CRM for notification. See Telephone encounter for: 01/19/19. Richard, pts spouse calling to let Dr. Martinique know that his wife just seen Dr. Valeta Harms at [facility] and pt has cancer in her right lung and the plan is to remove her right lung. The PET scan was performed on on Friday and they received the result today. He is requesting to see if her xanax can be increased during this time to help her cope. Please advise.

## 2019-01-19 NOTE — Progress Notes (Signed)
Synopsis: Referred in Feb 2020 for RUL lung nodule by Martin[place], [name], MD  Subjective:   PATIENT ID: Tonya Bradley GENDER: female DOB: Jul 30, 1951, MRN: [id number]  Chief Complaint  Patient presents with  . Consult    Referred by NP S. Groce from [facility] for abnormal Chest CT. States has a dry cough for years.     Past medical history of skin cancer, coronary artery disease, atrial flutter, gastroesophageal reflux disease, PFTs with mixed restrictive obstructive defect, impaired spirometry, normal ratio, moderately reduced DLCO.  Patient was initially seen by 1 of our nurse practitioners and referred to lung cancer screening program.  She originally was followed by Dr. Lamonte Sakai for several years.  I reviewed her images with Eric Form last week which revealed a right upper lobe lobulated pulmonary nodule.  A PET scan was completed which revealed low PET SUV uptake.  However is relatively small in size.  Patient otherwise has continued to smoke approximately 1 to 2 packs/day.  She is interested in quitting.  Today we discussed prior options that she has used.  She has tried to quit in the past and has been unsuccessful.  She is very concerned this is potential for malignancy.  Patient was counseled on this.   Past Medical History:  Diagnosis Date  . Anemia   . Anxiety   . Arthritis    "back"  . Atrial flutter ([place]) 03/13/2016  . CAD (coronary artery disease)   . Cancer ([place])    skin cancer  . Cataract   . Colon polyp   . COPD (chronic obstructive pulmonary disease) ([place])   . Depression   . Dysrhythmia   . Emphysema of lung ([place])   . GERD (gastroesophageal reflux disease)   . Hypercholesterolemia   . Osteoporosis   . Oxygen deficiency      Family History  Problem Relation Age of Onset  . Breast cancer Sister 82  . Heart disease Sister   . Heart attack Father   . Heart disease Father   . Stroke Mother   . Heart disease Mother   . Hyperlipidemia  Mother   . Heart attack Sister   . Heart disease Brother        ?heart failure  . Congestive Heart Failure Brother   . Hypertension Son   . Heart disease Maternal Grandfather   . Cancer Cousin        lymphoma  . Hypertension Son   . Stomach cancer Other   . Colon cancer Neg Hx   . Pancreatic cancer Neg Hx      Past Surgical History:  Procedure Laterality Date  . A-FLUTTER ABLATION N/A 05/19/2017   Procedure: A-Flutter Ablation;  Surgeon: Evans Lance, MD;  Location: [facility];  Service: Cardiovascular;  Laterality: N/A;  . APPENDECTOMY    . BASAL CELL CARCINOMA EXCISION    . CARDIAC CATHETERIZATION    . CATARACT EXTRACTION Bilateral   . CHOLECYSTECTOMY    . COLONOSCOPY N/A 06/23/2014   Dr. Rourk:Rectal and colonic polyps-removed, Pancolonic diverticulosis. lymphocytic colitis  . CORONARY ANGIOPLASTY WITH STENT PLACEMENT    . ESOPHAGEAL DILATION N/A 03/13/2016   Procedure: ESOPHAGEAL DILATION;  Surgeon: Daneil Dolin, MD;  [facility];  Service: Endoscopy;  Laterality: N/A;  . ESOPHAGOGASTRODUODENOSCOPY N/A 03/13/2016   Procedure: ESOPHAGOGASTRODUODENOSCOPY (EGD);  Surgeon: Daneil Dolin, MD;  [facility]: [facility];  Service: Endoscopy;  Laterality: N/A;  0930  .  EYE SURGERY     cataract removed, bilateral  . LUMBAR LAMINECTOMY/DECOMPRESSION MICRODISCECTOMY N/A 01/19/2018   Procedure: L4-5 DECOMPRESSION;  Surgeon: Marybelle Killings, MD;  Location: [facility];  Service: Orthopedics;  Laterality: N/A;    Social History   Socioeconomic History  . Marital status: Married    Spouse name: Not on file  . Number of children: 3  . Years of education: 10  . Highest education level: Not on file  Occupational History  . Occupation: retired    Comment: customer service [employer]  . Financial resource strain: Not on file  . Food insecurity:    Worry: Not on file    Inability: Not on file  . Transportation needs:    Medical: Not on file     Non-medical: Not on file  Tobacco Use  . Smoking status: Current Every Day Smoker    Packs/day: 1.75    Years: 54.00    Pack years: 94.50    Types: Cigarettes    Start date: 06/14/1966  . Smokeless tobacco: Never Used  . Tobacco comment: 1-2 packs daily. Chantix too expensive  Substance and Sexual Activity  . Alcohol use: No    Alcohol/week: 0.0 standard drinks  . Drug use: No  . Sexual activity: Yes    Birth control/protection: Post-menopausal  Lifestyle  . Physical activity:    Days per week: Not on file    Minutes per session: Not on file  . Stress: Not on file  Relationships  . Social connections:    Talks on phone: Not on file    Gets together: Not on file    Attends religious service: Not on file    Active member of club or organization: Not on file    Attends meetings of clubs or organizations: Not on file    Relationship status: Not on file  . Intimate partner violence:    Fear of current or ex partner: Not on file    Emotionally abused: Not on file    Physically abused: Not on file    Forced sexual activity: Not on file  Other Topics Concern  . Not on file  Social History Narrative   Quit high school and got married in 10th grade, age 47   First husband died of colon cancer at young age   Married again 2018   Lives in own home   Daughter lives with her   Drinks coffee seldom, too much "sweet tea"     No Known Allergies   Outpatient Medications Prior to Visit  Medication Sig Dispense Refill  . ALPRAZolam (XANAX) 0.5 MG tablet TAKE 1 TABLET BY MOUTH TWICE DAILY AS NEEDED FOR ANXIETY; MUST LAST 30 DAYS 40 tablet 1  . amiodarone (PACERONE) 200 MG tablet Take 1/2 tablet (100 mg) by mouth once daily (Patient taking differently: Take 100 mg by mouth daily. Take 1/2 tablet (100 mg) by mouth once daily) 90 tablet 3  . aspirin EC 81 MG tablet Take 81 mg by mouth daily.    Marland Kitchen atorvastatin (LIPITOR) 80 MG tablet TAKE 1 TABLET(80 MG) BY MOUTH DAILY 90 tablet 1  .  Cholecalciferol (VITAMIN D) 2000 units CAPS Take 2,000 Units by mouth daily.    . cycloSPORINE (RESTASIS) 0.05 % ophthalmic emulsion Place 1 drop into both eyes 2 (two) times daily.    Marland Kitchen DEXILANT 30 MG capsule TAKE 1 CAPSULE(30 MG) BY MOUTH DAILY 90 capsule 2  . diltiazem (CARDIZEM CD) 120  MG 24 hr capsule TAKE 1 CAPSULE(120 MG) BY MOUTH DAILY 90 capsule 2  . DULoxetine (CYMBALTA) 60 MG capsule Take 1 capsule (60 mg total) by mouth daily. (Patient taking differently: Take 60 mg by mouth at bedtime. ) 90 capsule 1  . furosemide (LASIX) 20 MG tablet 1 tab 3 times per day. 90 tablet 3  . LINZESS 290 MCG CAPS capsule TAKE 1 CAPSULE(290 MCG) BY MOUTH DAILY BEFORE BREAKFAST 30 capsule 0  . Multiple Vitamin (MULTIVITAMIN) tablet Take 1 tablet by mouth daily.    . nitroGLYCERIN (NITROSTAT) 0.4 MG SL tablet Place 1 tablet (0.4 mg total) under the tongue every 5 (five) minutes as needed for chest pain. Reported on 06/12/2016 30 tablet 2  . potassium chloride (K-DUR) 10 MEQ tablet TAKE 1 TABLET(10 MEQ) BY MOUTH DAILY 90 tablet 2  . rOPINIRole (REQUIP) 0.25 MG tablet Take 2 tablets (0.5 mg total) by mouth at bedtime. 180 tablet 0  . terbinafine (LAMISIL) 250 MG tablet Take 250 mg by mouth daily.    . TRELEGY ELLIPTA 100-62.5-25 MCG/INH AEPB INHALE 1 PUFF INTO THE LUNGS DAILY 60 each 3  . VENTOLIN HFA 108 (90 Base) MCG/ACT inhaler INHALE 2 PUFFS INTO THE LUNGS EVERY 6 HOURS AS NEEDED FOR WHEEZING OR SHORTNESS OF BREATH 18 g 0  . oxyCODONE (OXY IR/ROXICODONE) 5 MG immediate release tablet Take 1 tablet (5 mg total) by mouth every 6 (six) hours as needed for severe pain. 30 tablet 0   No facility-administered medications prior to visit.     Review of Systems  Constitutional: Negative for chills, fever, malaise/fatigue and weight loss.  HENT: Negative for hearing loss, sore throat and tinnitus.   Eyes: Negative for blurred vision and double vision.  Respiratory: Positive for cough. Negative for hemoptysis,  sputum production, shortness of breath, wheezing and stridor.   Cardiovascular: Negative for chest pain, palpitations, orthopnea, leg swelling and PND.  Gastrointestinal: Negative for abdominal pain, constipation, diarrhea, heartburn, nausea and vomiting.  Genitourinary: Negative for dysuria, hematuria and urgency.  Musculoskeletal: Negative for joint pain and myalgias.  Skin: Negative for itching and rash.  Neurological: Negative for dizziness, tingling, weakness and headaches.  Endo/Heme/Allergies: Negative for environmental allergies. Does not bruise/bleed easily.  Psychiatric/Behavioral: Negative for depression. The patient is not nervous/anxious and does not have insomnia.   All other systems reviewed and are negative.    Objective:  Physical Exam Vitals signs reviewed.  Constitutional:      General: She is not in acute distress.    Appearance: She is well-developed.  HENT:     Head: Normocephalic and atraumatic.  Eyes:     General: No scleral icterus.    Conjunctiva/sclera: Conjunctivae normal.     Pupils: Pupils are equal, round, and reactive to light.  Neck:     Musculoskeletal: Neck supple.     Vascular: No JVD.     Trachea: No tracheal deviation.  Cardiovascular:     Rate and Rhythm: Normal rate and regular rhythm.     Heart sounds: Normal heart sounds. No murmur.  Pulmonary:     Effort: No tachypnea, accessory muscle usage or respiratory distress.     Breath sounds: No stridor. No wheezing, rhonchi or rales.     Comments: Diminished breath sounds bilaterally, no wheeze Abdominal:     General: Bowel sounds are normal. There is no distension.     Palpations: Abdomen is soft.     Tenderness: There is no abdominal tenderness.  Musculoskeletal:  General: No tenderness.  Lymphadenopathy:     Cervical: No cervical adenopathy.  Skin:    General: Skin is warm and dry.     Capillary Refill: Capillary refill takes less than 2 seconds.     Findings: No rash.    Neurological:     Mental Status: She is alert and oriented to person, place, and time.  Psychiatric:        Behavior: Behavior normal.      Vitals:   01/19/19 1152  BP: 118/60  Pulse: (!) 130  SpO2: 96%  Weight: 162 lb (73.5 kg)  Height: 5\' 2"  (1.575 m)   96% on RA BMI Readings from Last 3 Encounters:  01/19/19 29.63 kg/m  01/12/19 30.73 kg/m  01/11/19 30.84 kg/m   Wt Readings from Last 3 Encounters:  01/19/19 162 lb (73.5 kg)  01/12/19 168 lb (76.2 kg)  01/11/19 168 lb 9.6 oz (76.5 kg)     CBC    Component Value Date/Time   WBC 11.0 (H) 01/03/2019 0441   RBC 3.72 (L) 01/03/2019 0441   HGB 11.0 (L) 01/03/2019 0441   HGB 12.6 05/12/2017 1142   HCT 36.0 01/03/2019 0441   HCT 36.6 05/12/2017 1142   PLT 255 01/03/2019 0441   PLT 335 05/12/2017 1142   MCV 96.8 01/03/2019 0441   MCV 87 05/12/2017 1142   MCH 29.6 01/03/2019 0441   MCHC 30.6 01/03/2019 0441   RDW 13.6 01/03/2019 0441   RDW 14.0 05/12/2017 1142   LYMPHSABS 1.8 01/03/2019 0441   LYMPHSABS 2.0 05/12/2017 1142   MONOABS 0.8 01/03/2019 0441   EOSABS 0.1 01/03/2019 0441   EOSABS 0.0 05/12/2017 1142   BASOSABS 0.1 01/03/2019 0441   BASOSABS 0.0 05/12/2017 1142    Chest Imaging: 01/11/2019 lung cancer screening CT: Right upper lobe bilobed lung nodule.  01/15/2019: Nuclear medicine pet imaging: Right upper lobe bilobed lung nodule with hypermetabolism, SUV max 3.2. No evidence of mediastinal adenopathy or extrathoracic metastasis.  The patient's images have been independently reviewed by me.    Pulmonary Functions Testing Results: PFT Results Latest Ref Rng & Units 02/03/2018  FVC-Pre L 1.93  FVC-Predicted Pre % 68  FVC-Post L 1.85  FVC-Predicted Post % 65  Pre FEV1/FVC % % 82  Post FEV1/FCV % % 82  FEV1-Pre L 1.59  FEV1-Predicted Pre % 74  FEV1-Post L 1.51  DLCO UNC% % 65  DLCO COR %Predicted % 98  TLC L 4.84  TLC % Predicted % 101  RV % Predicted % 141    FeNO: None   Pathology:  None   Echocardiogram:   Myocardial perfusion is normal. The study is normal. This is a low risk study. Overall left ventricular systolic function was normal. LV cavity size is normal. Nuclear stress EF: 59%.   Heart Catheterization: None     Assessment & Plan:   Lung nodule - Plan: Ambulatory referral to Cardiothoracic Surgery  Abnormal PET of right lung  Current smoker  Mixed restrictive and obstructive lung disease ([place])  Discussion:  This is a 68 year old female with a positive lung cancer screening CT for a bilobed right upper lobe lung nodule that is PET avid with an SUV of 3.2 on recent imaging.  She does have lung function with impaired spirometry however normal ratio and a moderately reduced DLCO consistent with a mixed obstructive restrictive defect in her longstanding history of smoking.  She is a current smoker.  Greater than 10 minutes of this patient's visit  was spent face-to-face counseling on smoking cessation and various ways to quit.  We discussed tapering methods, addition of medication aids as well as setting of a quit date.  At the conclusion of this discussion the patient has decided to try a Chantix starter pack.  Today we also discussed various methods in additional follow-up versus biopsy versus evaluation by cardiothoracic surgery regarding her right upper lobe lung nodule.  After discussing these various avenues I believe that the next best step would be evaluation by cardiothoracic surgery and she agrees with this plan.  I have also discussed this with the patient's primary pulmonologist Dr. Lamonte Sakai.  I have placed a referral to cardiothoracic surgery for evaluation to consider resection.  Greater than 50% of this patient's 40-minute of visit was been face-to-face discussing above recommendations and treatment plan.   Current Outpatient Medications:  .  ALPRAZolam (XANAX) 0.5 MG tablet, TAKE 1 TABLET BY MOUTH TWICE DAILY AS NEEDED FOR ANXIETY; MUST LAST 30  DAYS, Disp: 40 tablet, Rfl: 1 .  amiodarone (PACERONE) 200 MG tablet, Take 1/2 tablet (100 mg) by mouth once daily (Patient taking differently: Take 100 mg by mouth daily. Take 1/2 tablet (100 mg) by mouth once daily), Disp: 90 tablet, Rfl: 3 .  aspirin EC 81 MG tablet, Take 81 mg by mouth daily., Disp: , Rfl:  .  atorvastatin (LIPITOR) 80 MG tablet, TAKE 1 TABLET(80 MG) BY MOUTH DAILY, Disp: 90 tablet, Rfl: 1 .  Cholecalciferol (VITAMIN D) 2000 units CAPS, Take 2,000 Units by mouth daily., Disp: , Rfl:  .  cycloSPORINE (RESTASIS) 0.05 % ophthalmic emulsion, Place 1 drop into both eyes 2 (two) times daily., Disp: , Rfl:  .  DEXILANT 30 MG capsule, TAKE 1 CAPSULE(30 MG) BY MOUTH DAILY, Disp: 90 capsule, Rfl: 2 .  diltiazem (CARDIZEM CD) 120 MG 24 hr capsule, TAKE 1 CAPSULE(120 MG) BY MOUTH DAILY, Disp: 90 capsule, Rfl: 2 .  DULoxetine (CYMBALTA) 60 MG capsule, Take 1 capsule (60 mg total) by mouth daily. (Patient taking differently: Take 60 mg by mouth at bedtime. ), Disp: 90 capsule, Rfl: 1 .  furosemide (LASIX) 20 MG tablet, 1 tab 3 times per day., Disp: 90 tablet, Rfl: 3 .  LINZESS 290 MCG CAPS capsule, TAKE 1 CAPSULE(290 MCG) BY MOUTH DAILY BEFORE BREAKFAST, Disp: 30 capsule, Rfl: 0 .  Multiple Vitamin (MULTIVITAMIN) tablet, Take 1 tablet by mouth daily., Disp: , Rfl:  .  nitroGLYCERIN (NITROSTAT) 0.4 MG SL tablet, Place 1 tablet (0.4 mg total) under the tongue every 5 (five) minutes as needed for chest pain. Reported on 06/12/2016, Disp: 30 tablet, Rfl: 2 .  potassium chloride (K-DUR) 10 MEQ tablet, TAKE 1 TABLET(10 MEQ) BY MOUTH DAILY, Disp: 90 tablet, Rfl: 2 .  rOPINIRole (REQUIP) 0.25 MG tablet, Take 2 tablets (0.5 mg total) by mouth at bedtime., Disp: 180 tablet, Rfl: 0 .  terbinafine (LAMISIL) 250 MG tablet, Take 250 mg by mouth daily., Disp: , Rfl:  .  TRELEGY ELLIPTA 100-62.5-25 MCG/INH AEPB, INHALE 1 PUFF INTO THE LUNGS DAILY, Disp: 60 each, Rfl: 3 .  VENTOLIN HFA 108 (90 Base) MCG/ACT  inhaler, INHALE 2 PUFFS INTO THE LUNGS EVERY 6 HOURS AS NEEDED FOR WHEEZING OR SHORTNESS OF BREATH, Disp: 18 g, Rfl: 0   Garner Nash, DO [facility] 01/19/2019 12:14 PM

## 2019-01-20 ENCOUNTER — Encounter: Payer: Medicare HMO | Admitting: Physical Therapy

## 2019-01-20 ENCOUNTER — Ambulatory Visit (INDEPENDENT_AMBULATORY_CARE_PROVIDER_SITE_OTHER): Payer: Medicare HMO | Admitting: Family Medicine

## 2019-01-20 ENCOUNTER — Encounter: Payer: Self-pay | Admitting: Family Medicine

## 2019-01-20 VITALS — BP 112/78 | HR 76 | Temp 98.5°F | Resp 16 | Ht 62.0 in | Wt 161.5 lb

## 2019-01-20 DIAGNOSIS — I2510 Atherosclerotic heart disease of native coronary artery without angina pectoris: Secondary | ICD-10-CM | POA: Diagnosis not present

## 2019-01-20 DIAGNOSIS — Z720 Tobacco use: Secondary | ICD-10-CM | POA: Diagnosis not present

## 2019-01-20 DIAGNOSIS — F419 Anxiety disorder, unspecified: Secondary | ICD-10-CM | POA: Diagnosis not present

## 2019-01-20 DIAGNOSIS — R911 Solitary pulmonary nodule: Secondary | ICD-10-CM | POA: Diagnosis not present

## 2019-01-20 MED ORDER — ALPRAZOLAM 0.5 MG PO TABS: 0.5000 mg | ORAL_TABLET | Freq: Two times a day (BID) | ORAL | 1 refills | Status: DC | PRN

## 2019-01-20 NOTE — Progress Notes (Signed)
ACUTE VISIT   HPI:  Chief Complaint  Patient presents with  . Discuss PET Scan results and medication increase    Ms.Kary Gaylin Osoria is a 68 y.o. female with Hx of CAD,atrial flutter,chronic back pain,and and COPD is here today with her husband complaining of worsening anxiety due to a new health problem. She has a long history of anxiety, currently she is on Cymbalta 60 mg daily and alprazolam 0.5 mg twice daily as needed.  In 05/2017 when she first established she was on Xanax 1 mg 3 times daily, we gradually decreased to current dose. She recently called requesting increasing dose of Xanax to 1 mg because a new diagnosis of "lung cancer." In the past she has been on Lexapro and BuSpar. She was changed to Cymbalta because chronic back pain.  She is supposed not to take more than 40 tablets of Xanax per month, frequently she reports taking more than prescribed and has requested going back to prior dose a few times for the past year or so.   She is also following with pain clinic, on Oxycodone.  She is currently following with pulmonologist, history of COPD and tobacco use. She has lung cancer screening on 01/12/2019. Suspicious lesion was appreciated in right upper lobe.  PET scan was obtained on 01/15/2019: 1. The new bilobed nodule versus adjacent 2 nodules identified in the right upper lobe on recent lung cancer screening CT show low level hypermetabolism. Neoplasm is a distinct concern. 2. No evidence for hypermetabolic metastases in the right hilum or mediastinum. No additional unexpected hypermetabolic disease in the neck, chest, abdomen, or pelvis. 3.  Aortic Atherosclerois (ICD10-170.0)  She is working on smoking cessation. According to pt,she must quit smoking before surgery can be performed. Started on Chantix a week ago,decreased cig/day. COPD, denies worsening dyspnea,cough,or wheezing. She si on Trelegy Ellipta and Albuterol inh prn.   CAD and atrial  flutter. She has an appt with her cardiologist in a few days for surgery clearance. Negative for chest pain,palpitationsmor syncope. She is on Amiodarone and Cardiazem.    Review of Systems  Constitutional: Negative for activity change, appetite change, fatigue and fever.  HENT: Negative for mouth sores and sore throat.   Eyes: Negative for redness and visual disturbance.  Cardiovascular: Positive for leg swelling. Negative for chest pain and palpitations.  Gastrointestinal: Negative for abdominal pain, nausea and vomiting.       Negative for changes in bowel habits.  Musculoskeletal: Positive for arthralgias and back pain. Negative for gait problem.  Neurological: Negative for syncope, weakness and headaches.  Psychiatric/Behavioral: Positive for sleep disturbance. Negative for suicidal ideas. The patient is nervous/anxious.     Current Outpatient Medications on File Prior to Visit  Medication Sig Dispense Refill  . amiodarone (PACERONE) 200 MG tablet Take 1/2 tablet (100 mg) by mouth once daily (Patient taking differently: Take 100 mg by mouth daily. Take 1/2 tablet (100 mg) by mouth once daily) 90 tablet 3  . aspirin EC 81 MG tablet Take 81 mg by mouth daily.    Marland Kitchen atorvastatin (LIPITOR) 80 MG tablet TAKE 1 TABLET(80 MG) BY MOUTH DAILY 90 tablet 1  . Cholecalciferol (VITAMIN D) 2000 units CAPS Take 2,000 Units by mouth daily.    . cycloSPORINE (RESTASIS) 0.05 % ophthalmic emulsion Place 1 drop into both eyes 2 (two) times daily.    Marland Kitchen DEXILANT 30 MG capsule TAKE 1 CAPSULE(30 MG) BY MOUTH DAILY 90 capsule 2  .  diltiazem (CARDIZEM CD) 120 MG 24 hr capsule TAKE 1 CAPSULE(120 MG) BY MOUTH DAILY 90 capsule 2  . DULoxetine (CYMBALTA) 60 MG capsule Take 1 capsule (60 mg total) by mouth daily. (Patient taking differently: Take 60 mg by mouth at bedtime. ) 90 capsule 1  . furosemide (LASIX) 20 MG tablet 1 tab 3 times per day. 90 tablet 3  . LINZESS 290 MCG CAPS capsule TAKE 1 CAPSULE(290 MCG) BY  MOUTH DAILY BEFORE BREAKFAST 30 capsule 0  . Multiple Vitamin (MULTIVITAMIN) tablet Take 1 tablet by mouth daily.    . nitroGLYCERIN (NITROSTAT) 0.4 MG SL tablet Place 1 tablet (0.4 mg total) under the tongue every 5 (five) minutes as needed for chest pain. Reported on 06/12/2016 30 tablet 2  . potassium chloride (K-DUR) 10 MEQ tablet TAKE 1 TABLET(10 MEQ) BY MOUTH DAILY 90 tablet 2  . rOPINIRole (REQUIP) 0.25 MG tablet Take 2 tablets (0.5 mg total) by mouth at bedtime. 180 tablet 0  . terbinafine (LAMISIL) 250 MG tablet Take 250 mg by mouth daily.    . TRELEGY ELLIPTA 100-62.5-25 MCG/INH AEPB INHALE 1 PUFF INTO THE LUNGS DAILY 60 each 3  . varenicline (CHANTIX PAK) 0.5 MG X 11 & 1 MG X 42 tablet Take one 0.5 mg tablet by mouth once daily for 3 days, then increase to one 0.5 mg tablet twice daily for 4 days, then increase to one 1 mg tablet twice daily. 53 tablet 0  . VENTOLIN HFA 108 (90 Base) MCG/ACT inhaler INHALE 2 PUFFS INTO THE LUNGS EVERY 6 HOURS AS NEEDED FOR WHEEZING OR SHORTNESS OF BREATH 18 g 0   No current facility-administered medications on file prior to visit.      Past Medical History:  Diagnosis Date  . Anemia   . Anxiety   . Arthritis    "back"  . Atrial flutter ([place]) 03/13/2016  . CAD (coronary artery disease)   . Cancer ([place])    skin cancer  . Cataract   . Colon polyp   . COPD (chronic obstructive pulmonary disease) ([place])   . Depression   . Dysrhythmia   . Emphysema of lung ([place])   . GERD (gastroesophageal reflux disease)   . Hypercholesterolemia   . Osteoporosis   . Oxygen deficiency    No Known Allergies  Social History   Socioeconomic History  . Marital status: Married    Spouse name: Not on file  . Number of children: 3  . Years of education: 10  . Highest education level: Not on file  Occupational History  . 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 Financial resource strain: Not on file  . Food insecurity:     Worry: Not on file    Inability: Not on file  . Transportation needs:    Medical: Not on file    Non-medical: Not on file  Tobacco Use  . Smoking status: Current Every Day Smoker    Packs/day: 1.75    Years: 54.00    Pack years: 94.50    Types: Cigarettes    Start date: 06/14/1966  . Smokeless tobacco: Never Used  . Tobacco comment: 1-2 packs daily. Chantix too expensive  Substance and Sexual Activity  . Alcohol use: No    Alcohol/week: 0.0 standard drinks  . Drug use: No  . Sexual activity: Yes    Birth control/protection: Post-menopausal  Lifestyle  . Physical activity:    Days per week: Not  on file    Minutes per session: Not on file  . Stress: Not on file  Relationships  . Social connections:    Talks on phone: Not on file    Gets together: Not on file    Attends religious service: Not on file    Active member of club or organization: Not on file    Attends meetings of clubs or organizations: Not on file    Relationship status: Not on file  Other Topics Concern  . Not on file  Social History Narrative   Quit high school and got married in 10th grade, age 59   First husband died of colon cancer at young age   Married again 2018   Lives in own home   Daughter lives with her   Drinks coffee seldom, too much "sweet tea"    Vitals:   01/20/19 1446  BP: 112/78  Pulse: 76  Resp: 16  Temp: 98.5 F (36.9 C)  SpO2: 95%   Body mass index is 29.54 kg/m.   Physical Exam  Nursing note and vitals reviewed. Constitutional: She is oriented to person, place, and time. She appears well-developed. No distress.  HENT:  Head: Normocephalic and atraumatic.  Mouth/Throat: Oropharynx is clear and moist and mucous membranes are normal.  Eyes: Pupils are equal, round, and reactive to light. Conjunctivae are normal.  Cardiovascular: Normal rate and regular rhythm.  No murmur heard. Respiratory: Effort normal and breath sounds normal. No respiratory distress.  GI: There is no  hepatomegaly.  Musculoskeletal:        General: Edema (2+ LE pitting edema,bilateral) present.  Lymphadenopathy:    She has no cervical adenopathy.  Neurological: She is alert and oriented to person, place, and time. She has normal strength. No cranial nerve deficit.  Stable gait.  Skin: Skin is warm. No rash noted. No erythema.  Psychiatric: Her mood appears anxious.  Well groomed, good eye contact.    ASSESSMENT AND PLAN:  Ms. Esty was seen today for discuss pet scan results and medication increase.  Diagnoses and all orders for this visit:  Tobacco abuse Encouraged to continue working in smoking cessation. Adverse effects of tobacco use and benefits of smoking cessation reviewed. Some side effects of Chantix discussed.  Chronic anxiety Getting worse. Cymbalta 60 mg to continue, it has helped some with chronic back pain. Side effects of Xanax discussed. Xanax increased from 05 mg bid as needed to tid as needed. F/U in 2 months.  -     ALPRAZolam (XANAX) 0.5 MG tablet; Take 1 tablet (0.5 mg total) by mouth 2 (two) times daily as needed for anxiety.  Coronary artery disease involving native coronary artery of native heart without angina pectoris Asymptomatic. She has appt with cardiologist in a few days. Today she is SR.  Solitary pulmonary nodule She is concerned about possible complications during and after surgery. States that she was told, if she had another surgery she may not wake up. She also has appt with her pulmonologist for surgical clearance. We went through chest CT report.  She is a high risk pt,discussed risks vs benefits of surgical treatment, it seems like at this time benefit is greater.     Return in about 2 months (around 03/21/2019) for anxiety.    [name]. Martinique, MD  [facility]. [place] office.

## 2019-01-20 NOTE — Patient Instructions (Addendum)
A few things to remember from today's visit:   Solitary pulmonary nodule  Chronic anxiety - Plan: ALPRAZolam (XANAX) 0.5 MG tablet  Tobacco abuse  Coronary artery disease involving native coronary artery of native heart without angina pectoris Sinus was increased from 0.5 mg twice daily as needed to 0.5 mg twice daily, total of 60 tablets/month. Certainly a refill will be earlier, 02/02/2019.  Congratulations about trying to with her smoking.  Please be sure medication list is accurate. If a new problem present, please set up appointment sooner than planned today.

## 2019-01-22 DIAGNOSIS — M79675 Pain in left toe(s): Secondary | ICD-10-CM | POA: Diagnosis not present

## 2019-01-22 DIAGNOSIS — M79674 Pain in right toe(s): Secondary | ICD-10-CM | POA: Diagnosis not present

## 2019-01-22 DIAGNOSIS — B351 Tinea unguium: Secondary | ICD-10-CM | POA: Diagnosis not present

## 2019-01-22 DIAGNOSIS — L600 Ingrowing nail: Secondary | ICD-10-CM | POA: Diagnosis not present

## 2019-01-24 ENCOUNTER — Encounter: Payer: Self-pay | Admitting: Family Medicine

## 2019-01-25 ENCOUNTER — Encounter: Payer: Medicare HMO | Admitting: Physical Therapy

## 2019-01-26 ENCOUNTER — Ambulatory Visit (INDEPENDENT_AMBULATORY_CARE_PROVIDER_SITE_OTHER): Payer: Medicare HMO | Admitting: Internal Medicine

## 2019-01-26 ENCOUNTER — Telehealth: Payer: Self-pay | Admitting: *Deleted

## 2019-01-26 ENCOUNTER — Encounter: Payer: Self-pay | Admitting: Internal Medicine

## 2019-01-26 VITALS — BP 112/68 | HR 83 | Ht 62.0 in | Wt 160.2 lb

## 2019-01-26 DIAGNOSIS — I4892 Unspecified atrial flutter: Secondary | ICD-10-CM | POA: Diagnosis not present

## 2019-01-26 DIAGNOSIS — I10 Essential (primary) hypertension: Secondary | ICD-10-CM | POA: Diagnosis not present

## 2019-01-26 NOTE — Telephone Encounter (Signed)
It is okay to call the pharmacy to authorize early refill. Thanks, BJ

## 2019-01-26 NOTE — Progress Notes (Signed)
HPI Tonya [place] returns today for followup. She is a pleasant 68 yo woman with a h/o atrial flutter , HTN and COPD who continues to smoke. The patient has a h/o atrial flutter and underwent ablation but also was found to have atrial fib and was placed on low dose amiodarone. She has had minimal if any palpitations. No edema. She has been found to have a lung nodule which by PET was suspicious for CA. She is pending a visit to thoracic surgery. She has had a negative stress test just over a year ago. She does have calcium in her coronary arteries.  No Known Allergies   Current Outpatient Medications  Medication Sig Dispense Refill  . ALPRAZolam (XANAX) 0.5 MG tablet Take 1 tablet (0.5 mg total) by mouth 2 (two) times daily as needed for anxiety. 60 tablet 1  . amiodarone (PACERONE) 200 MG tablet Take 1/2 tablet (100 mg) by mouth once daily (Patient taking differently: Take 100 mg by mouth daily. Take 1/2 tablet (100 mg) by mouth once daily) 90 tablet 3  . aspirin EC 81 MG tablet Take 81 mg by mouth daily.    Marland Kitchen atorvastatin (LIPITOR) 80 MG tablet TAKE 1 TABLET(80 MG) BY MOUTH DAILY 90 tablet 1  . Cholecalciferol (VITAMIN D) 2000 units CAPS Take 2,000 Units by mouth daily.    . cycloSPORINE (RESTASIS) 0.05 % ophthalmic emulsion Place 1 drop into both eyes 2 (two) times daily.    Marland Kitchen DEXILANT 30 MG capsule TAKE 1 CAPSULE(30 MG) BY MOUTH DAILY 90 capsule 2  . diltiazem (CARDIZEM CD) 120 MG 24 hr capsule TAKE 1 CAPSULE(120 MG) BY MOUTH DAILY 90 capsule 2  . DULoxetine (CYMBALTA) 60 MG capsule Take 1 capsule (60 mg total) by mouth daily. (Patient taking differently: Take 60 mg by mouth at bedtime. ) 90 capsule 1  . furosemide (LASIX) 20 MG tablet 1 tab 3 times per day. 90 tablet 3  . LINZESS 290 MCG CAPS capsule TAKE 1 CAPSULE(290 MCG) BY MOUTH DAILY BEFORE BREAKFAST 30 capsule 0  . Multiple Vitamin (MULTIVITAMIN) tablet Take 1 tablet by mouth daily.    . nitroGLYCERIN (NITROSTAT) 0.4 MG SL tablet  Place 1 tablet (0.4 mg total) under the tongue every 5 (five) minutes as needed for chest pain. Reported on 06/12/2016 30 tablet 2  . potassium chloride (K-DUR) 10 MEQ tablet TAKE 1 TABLET(10 MEQ) BY MOUTH DAILY 90 tablet 2  . rOPINIRole (REQUIP) 0.25 MG tablet Take 2 tablets (0.5 mg total) by mouth at bedtime. 180 tablet 0  . terbinafine (LAMISIL) 250 MG tablet Take 250 mg by mouth daily.    . TRELEGY ELLIPTA 100-62.5-25 MCG/INH AEPB INHALE 1 PUFF INTO THE LUNGS DAILY 60 each 3  . varenicline (CHANTIX PAK) 0.5 MG X 11 & 1 MG X 42 tablet Take one 0.5 mg tablet by mouth once daily for 3 days, then increase to one 0.5 mg tablet twice daily for 4 days, then increase to one 1 mg tablet twice daily. 53 tablet 0  . VENTOLIN HFA 108 (90 Base) MCG/ACT inhaler INHALE 2 PUFFS INTO THE LUNGS EVERY 6 HOURS AS NEEDED FOR WHEEZING OR SHORTNESS OF BREATH 18 g 0   No current facility-administered medications for this visit.      Past Medical History:  Diagnosis Date  . Anemia   . Anxiety   . Arthritis    "back"  . Atrial flutter ([place]) 03/13/2016  . CAD (coronary artery disease)   .  Cancer ([place])    skin cancer  . Cataract   . Colon polyp   . COPD (chronic obstructive pulmonary disease) ([place])   . Depression   . Dysrhythmia   . Emphysema of lung ([place])   . GERD (gastroesophageal reflux disease)   . Hypercholesterolemia   . Osteoporosis   . Oxygen deficiency     ROS:   All systems reviewed and negative except as noted in the HPI.   Past Surgical History:  Procedure Laterality Date  . A-FLUTTER ABLATION N/A 05/19/2017   Procedure: A-Flutter Ablation;  Surgeon: Evans Lance, MD;  Location: [place] [facility];  Service: Cardiovascular;  Laterality: N/A;  . APPENDECTOMY    . BASAL CELL CARCINOMA EXCISION    . CARDIAC CATHETERIZATION    . CATARACT EXTRACTION Bilateral   . CHOLECYSTECTOMY    . COLONOSCOPY N/A 06/23/2014   Dr. Rourk:Rectal and colonic polyps-removed, Pancolonic diverticulosis.  lymphocytic colitis  . CORONARY ANGIOPLASTY WITH STENT PLACEMENT    . ESOPHAGEAL DILATION N/A 03/13/2016   Procedure: ESOPHAGEAL DILATION;  Surgeon: Daneil Dolin, MD;  [facility];  Service: Endoscopy;  Laterality: N/A;  . ESOPHAGOGASTRODUODENOSCOPY N/A 03/13/2016   Procedure: ESOPHAGOGASTRODUODENOSCOPY (EGD);  Surgeon: Daneil Dolin, MD;  [facility];  Service: Endoscopy;  Laterality: N/A;  0930  . EYE SURGERY     cataract removed, bilateral  . LUMBAR LAMINECTOMY/DECOMPRESSION MICRODISCECTOMY N/A 01/19/2018   Procedure: L4-5 DECOMPRESSION;  Surgeon: Marybelle Killings, MD;  Location: [facility];  Service: Orthopedics;  Laterality: N/A;     Family History  Problem Relation Age of Onset  . Breast cancer Sister 38  . Heart disease Sister   . Heart attack Father   . Heart disease Father   . Stroke Mother   . Heart disease Mother   . Hyperlipidemia Mother   . Heart attack Sister   . Heart disease Brother        ?heart failure  . Congestive Heart Failure Brother   . Hypertension Son   . Heart disease Maternal Grandfather   . Cancer Cousin        lymphoma  . Hypertension Son   . Stomach cancer Other   . Colon cancer Neg Hx   . Pancreatic cancer Neg Hx      Social History   Socioeconomic History  . Marital status: Married    Spouse name: Not on file  . Number of children: 3  . Years of education: 10  . Highest education level: Not on file  Occupational History  . 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 Financial resource strain: Not on file  . Food insecurity:    Worry: Not on file    Inability: Not on file  . Transportation needs:    Medical: Not on file    Non-medical: Not on file  Tobacco Use  . Smoking status: Current Every Day Smoker    Packs/day: 1.75    Years: 54.00    Pack years: 94.50    Types: Cigarettes    Start date: 06/14/1966  . Smokeless tobacco: Never Used  . Tobacco comment: 1-2 packs daily.  Chantix too expensive  Substance and Sexual Activity  . Alcohol use: No    Alcohol/week: 0.0 standard drinks  . Drug use: No  . Sexual activity: Yes    Birth control/protection: Post-menopausal  Lifestyle  . Physical activity:    Days per week:  Not on file    Minutes per session: Not on file  . Stress: Not on file  Relationships  . Social connections:    Talks on phone: Not on file    Gets together: Not on file    Attends religious service: Not on file    Active member of club or organization: Not on file    Attends meetings of clubs or organizations: Not on file    Relationship status: Not on file  . Intimate partner violence:    Fear of current or ex partner: Not on file    Emotionally abused: Not on file    Physically abused: Not on file    Forced sexual activity: Not on file  Other Topics Concern  . Not on file  Social History Narrative   Quit high school and got married in 10th grade, age 74   First husband died of colon cancer at young age   Married again 2018   Lives in own home   Daughter lives with her   Drinks coffee seldom, too much "sweet tea"     BP 112/68   Pulse 83   Ht 5\' 2"  (1.575 m)   Wt 160 lb 3.2 oz (72.7 kg)   SpO2 97%   BMI 29.30 kg/m   Physical Exam:  Well appearing 68 yo woman, NAD HEENT: Unremarkable Neck:  7 cm JVD, no thyromegally Lymphatics:  No adenopathy Back:  No CVA tenderness Lungs:  Clear with minimal rales HEART:  Regular rate rhythm, no murmurs, no rubs, no clicks Abd:  soft, positive bowel sounds, no organomegally, no rebound, no guarding Ext:  2 plus pulses, no edema, no cyanosis, no clubbing Skin:  No rashes no nodules Neuro:  CN II through XII intact, motor grossly intact  EKG - nsr with IRBBB   Assess/Plan: 1. Preoperative eval - she is pending surgical consultation. She would be low/mod risk and no additional testing is indicated. She had a low risk stress just over a year ago. 2. Atrial fib - she has been  asymptomatic and in NSR on low dose amiodarone. 3. Tobacco abuse - she is trying to stop smoking. 4. COPD - she uses oxygen at night to sleep. She is not using the oxygen in the office today. She will continue her bronchodilators.  Tonya Bradley.D.

## 2019-01-26 NOTE — Telephone Encounter (Signed)
Spoke with patient's husband and he stated that patient will run out on Friday, before the refill date of 02/02/2019 that was sent to the pharmacy since the change has been made at the last office visit.

## 2019-01-26 NOTE — Telephone Encounter (Signed)
Copied from [place] [phone]. Topic: General - Other >> Jan 26, 2019 10:15 AM Antonieta Iba C wrote: Reason for CRM: pt called in to be advised about her ALPRAZolam Duanne Moron) 0.5 MG tablet, pt says that she was advised that she cant fill Rx until 3/3, pt says that she only has a few pills left. Pt would like to discuss this further.

## 2019-01-26 NOTE — Patient Instructions (Signed)

## 2019-01-27 ENCOUNTER — Institutional Professional Consult (permissible substitution) (INDEPENDENT_AMBULATORY_CARE_PROVIDER_SITE_OTHER): Payer: Medicare HMO | Admitting: Thoracic Surgery (Cardiothoracic Vascular Surgery)

## 2019-01-27 ENCOUNTER — Encounter: Payer: Medicare HMO | Admitting: Physical Therapy

## 2019-01-27 VITALS — BP 112/69 | HR 96 | Resp 20 | Ht 62.0 in | Wt 163.0 lb

## 2019-01-27 DIAGNOSIS — R918 Other nonspecific abnormal finding of lung field: Secondary | ICD-10-CM

## 2019-01-27 NOTE — Progress Notes (Signed)
PCP is Martinique, [name], MD Referring Provider is Icard, Octavio Graves, DO  Chief Complaint  Patient presents with  . Lung Lesion    Surgical eval, PET Scan 01/15/19, Chest CT 01/11/19, Abd/Pelvis CT 01/01/19, PFT's 02/03/18    HPI: Tonya Bradley is sent for consultation regarding a right upper lobe lung nodule   Tonya Bradley is a 68 yo woman with a history of tobacco abuse, COPD, home O2 at night, atrial flutter status post ablation, atrial fibrillation, coronary artery disease status post stent in 2000, hyperlipidemia, reflux, osteoporosis, depression, anxiety, anemia, and arthritis.  She started in the lung cancer screening program and May 2018.  Her scan at that time was unremarkable.  She recently had a repeat CT which showed bilobed right upper lobe pulmonary nodule.  The medial portion was 12.6 mm and the more lateral portion 9.9 mm.  Also possible this is 2 separate nodules.  The more proximal nodule is on an airway so it could be post obstructive phenomenon as well.  There was mild centrilobular emphysema, severe thoracic aortic atherosclerosis and coronary atherosclerosis.  There was no mediastinal or hilar adenopathy.  She smoked about 1.5 packs a day for 50 years.  She quit smoking about a week ago after starting Chantix.  She uses home oxygen at night and is on 3 L.  She says she seldom uses it during the day but does usually take it with her.  Her appetite is good.  Weight is stable.  Denies any chest pain, pressure, or tightness.  She is on low-dose amiodarone for atrial fibrillation.  She saw Dr. Lovena Le recently.  Not had any unusual headaches or visual changes.  No new bone or joint pain.  Quit smoking 01/21/2019  Zubrod Score: At the time of surgery this patient's most appropriate activity status/level should be described as: []     0    Normal activity, no symptoms [x]     1    Restricted in physical strenuous activity but ambulatory, able to do out light work []     2    Ambulatory and capable  of self care, unable to do work activities, up and about >50 % of waking hours                              []     3    Only limited self care, in bed greater than 50% of waking hours []     4    Completely disabled, no self care, confined to bed or chair []     5    Moribund  Past Medical History:  Diagnosis Date  . Anemia   . Anxiety   . Arthritis    "back"  . Atrial flutter ([place]) 03/13/2016  . CAD (coronary artery disease)   . Cancer ([place])    skin cancer  . Cataract   . Colon polyp   . COPD (chronic obstructive pulmonary disease) ([place])   . Depression   . Dysrhythmia   . Emphysema of lung ([place])   . GERD (gastroesophageal reflux disease)   . Hypercholesterolemia   . Osteoporosis   . Oxygen deficiency     Past Surgical History:  Procedure Laterality Date  . A-FLUTTER ABLATION N/A 05/19/2017   Procedure: A-Flutter Ablation;  Surgeon: Evans Lance, MD;  Location: [facility];  Service: Cardiovascular;  Laterality: N/A;  . APPENDECTOMY    . BASAL CELL CARCINOMA  EXCISION    . CARDIAC CATHETERIZATION    . CATARACT EXTRACTION Bilateral   . CHOLECYSTECTOMY    . COLONOSCOPY N/A 06/23/2014   Dr. Rourk:Rectal and colonic polyps-removed, Pancolonic diverticulosis. lymphocytic colitis  . CORONARY ANGIOPLASTY WITH STENT PLACEMENT    . ESOPHAGEAL DILATION N/A 03/13/2016   Procedure: ESOPHAGEAL DILATION;  Surgeon: Daneil Dolin, MD;  [facility]: [facility];  Service: Endoscopy;  Laterality: N/A;  . ESOPHAGOGASTRODUODENOSCOPY N/A 03/13/2016   Procedure: ESOPHAGOGASTRODUODENOSCOPY (EGD);  Surgeon: Daneil Dolin, MD;  Location: [facility];  Service: Endoscopy;  Laterality: N/A;  0930  . EYE SURGERY     cataract removed, bilateral  . LUMBAR LAMINECTOMY/DECOMPRESSION MICRODISCECTOMY N/A 01/19/2018   Procedure: L4-5 DECOMPRESSION;  Surgeon: Marybelle Killings, MD;  Location: [facility];  Service: Orthopedics;  Laterality: N/A;    Family History  Problem Relation Age of Onset  . Breast  cancer Sister 25  . Heart disease Sister   . Heart attack Father   . Heart disease Father   . Stroke Mother   . Heart disease Mother   . Hyperlipidemia Mother   . Heart attack Sister   . Heart disease Brother        ?heart failure  . Congestive Heart Failure Brother   . Hypertension Son   . Heart disease Maternal Grandfather   . Cancer Cousin        lymphoma  . Hypertension Son   . Stomach cancer Other   . Colon cancer Neg Hx   . Pancreatic cancer Neg Hx     Social History Social History   Tobacco Use  . Smoking status: Current Every Day Smoker    Packs/day: 1.75    Years: 54.00    Pack years: 94.50    Types: Cigarettes    Start date: 06/14/1966  . Smokeless tobacco: Never Used  . Tobacco comment: 1-2 packs daily. Chantix too expensive  Substance Use Topics  . Alcohol use: No    Alcohol/week: 0.0 standard drinks  . Drug use: No    Current Outpatient Medications  Medication Sig Dispense Refill  . ALPRAZolam (XANAX) 0.5 MG tablet Take 1 tablet (0.5 mg total) by mouth 2 (two) times daily as needed for anxiety. 60 tablet 1  . amiodarone (PACERONE) 200 MG tablet Take 1/2 tablet (100 mg) by mouth once daily (Patient taking differently: Take 100 mg by mouth daily. Take 1/2 tablet (100 mg) by mouth once daily) 90 tablet 3  . aspirin EC 81 MG tablet Take 81 mg by mouth daily.    Marland Kitchen atorvastatin (LIPITOR) 80 MG tablet TAKE 1 TABLET(80 MG) BY MOUTH DAILY 90 tablet 1  . Cholecalciferol (VITAMIN D) 2000 units CAPS Take 2,000 Units by mouth daily.    . cycloSPORINE (RESTASIS) 0.05 % ophthalmic emulsion Place 1 drop into both eyes 2 (two) times daily.    Marland Kitchen DEXILANT 30 MG capsule TAKE 1 CAPSULE(30 MG) BY MOUTH DAILY 90 capsule 2  . diltiazem (CARDIZEM CD) 120 MG 24 hr capsule TAKE 1 CAPSULE(120 MG) BY MOUTH DAILY 90 capsule 2  . DULoxetine (CYMBALTA) 60 MG capsule Take 1 capsule (60 mg total) by mouth daily. (Patient taking differently: Take 60 mg by mouth at bedtime. ) 90 capsule 1   . furosemide (LASIX) 20 MG tablet 1 tab 3 times per day. 90 tablet 3  . LINZESS 290 MCG CAPS capsule TAKE 1 CAPSULE(290 MCG) BY MOUTH DAILY BEFORE BREAKFAST 30 capsule 0  . Multiple Vitamin (  MULTIVITAMIN) tablet Take 1 tablet by mouth daily.    . nitroGLYCERIN (NITROSTAT) 0.4 MG SL tablet Place 1 tablet (0.4 mg total) under the tongue every 5 (five) minutes as needed for chest pain. Reported on 06/12/2016 30 tablet 2  . potassium chloride (K-DUR) 10 MEQ tablet TAKE 1 TABLET(10 MEQ) BY MOUTH DAILY 90 tablet 2  . rOPINIRole (REQUIP) 0.25 MG tablet Take 2 tablets (0.5 mg total) by mouth at bedtime. 180 tablet 0  . terbinafine (LAMISIL) 250 MG tablet Take 250 mg by mouth daily.    . TRELEGY ELLIPTA 100-62.5-25 MCG/INH AEPB INHALE 1 PUFF INTO THE LUNGS DAILY 60 each 3  . varenicline (CHANTIX PAK) 0.5 MG X 11 & 1 MG X 42 tablet Take one 0.5 mg tablet by mouth once daily for 3 days, then increase to one 0.5 mg tablet twice daily for 4 days, then increase to one 1 mg tablet twice daily. 53 tablet 0  . VENTOLIN HFA 108 (90 Base) MCG/ACT inhaler INHALE 2 PUFFS INTO THE LUNGS EVERY 6 HOURS AS NEEDED FOR WHEEZING OR SHORTNESS OF BREATH 18 g 0   No current facility-administered medications for this visit.     No Known Allergies  Review of Systems  Constitutional: Positive for fatigue and unexpected weight change (Lost 5 pounds in 3 months). Negative for activity change and appetite change.  HENT: Positive for dental problem (Dentures). Negative for trouble swallowing and voice change.   Eyes: Negative for visual disturbance.  Respiratory: Positive for cough and shortness of breath (With exertion). Negative for wheezing.        Home oxygen at night  Cardiovascular: Positive for leg swelling. Negative for chest pain.  Gastrointestinal: Positive for blood in stool (None recent). Negative for abdominal distention and abdominal pain.  Genitourinary: Negative for difficulty urinating and dysuria.   Musculoskeletal: Positive for arthralgias, joint swelling and myalgias.  Neurological: Negative for seizures, syncope and headaches.  Hematological: Negative for adenopathy. Does not bruise/bleed easily.  Psychiatric/Behavioral: The patient is nervous/anxious.     BP 112/69   Pulse 96   Resp 20   Ht 5\' 2"  (1.575 m)   Wt 163 lb (73.9 kg)   SpO2 92% Comment: RA  BMI 29.81 kg/m  Physical Exam Vitals signs reviewed.  Constitutional:      General: She is not in acute distress.    Appearance: Normal appearance.  HENT:     Head: Normocephalic and atraumatic.     Mouth/Throat:     Pharynx: Oropharynx is clear.  Eyes:     General: No scleral icterus.    Extraocular Movements: Extraocular movements intact.     Conjunctiva/sclera: Conjunctivae normal.  Neck:     Musculoskeletal: Neck supple.  Cardiovascular:     Rate and Rhythm: Normal rate. Rhythm irregular.     Heart sounds: No murmur. No friction rub. No gallop.   Pulmonary:     Effort: Pulmonary effort is normal. No respiratory distress.     Breath sounds: Normal breath sounds. No wheezing or rales.  Abdominal:     General: There is no distension.     Palpations: Abdomen is soft.     Tenderness: There is no abdominal tenderness.  Musculoskeletal:     Comments: Venous stasis changes bilateral lower extremities  Lymphadenopathy:     Cervical: No cervical adenopathy.  Skin:    General: Skin is warm and dry.  Neurological:     General: No focal deficit present.  Mental Status: She is alert and oriented to person, place, and time.     Cranial Nerves: No cranial nerve deficit.     Motor: No weakness.    Diagnostic Tests: CT CHEST WITHOUT CONTRAST LOW-DOSE FOR LUNG CANCER SCREENING  TECHNIQUE: Multidetector CT imaging of the chest was performed following the standard protocol without IV contrast.  COMPARISON:  04/03/2017.  Chest radiograph 01/14/2018.  FINDINGS: Cardiovascular: Aortic and branch vessel  atherosclerosis. Tortuous thoracic aorta. Mild cardiomegaly with lipomatous hypertrophy of the interatrial septum. Multivessel coronary artery atherosclerosis.  Mediastinum/Nodes: No mediastinal or definite hilar adenopathy, given limitations of unenhanced CT. Tiny hiatal hernia.  Lungs/Pleura: No pleural fluid. Mild centrilobular emphysema mild biapical pleuroparenchymal scarring.  Two adjacent versus 1 bilobed right upper lobe pulmonary nodule/nodules, new. The more lateral portion measures volume derived equivalent diameter 9.9 mm. The more medial portion measures volume derived equivalent diameter 12.6 mm  Upper Abdomen: Normal imaged portions of the liver, spleen, pancreas, adrenal glands, kidneys  Musculoskeletal: No acute osseous abnormality.  IMPRESSION: 1. Lung-RADS 4B, suspicious. Additional imaging evaluation or consultation with Pulmonology or Thoracic Surgery recommended. One bilobed versus 2 adjacent right upper lobe pulmonary nodule/nodules, new. 2. Aortic atherosclerosis (ICD10-I70.0), coronary artery atherosclerosis and emphysema (ICD10-J43.9).  These results will be called to the ordering clinician or representative by the Radiologist Assistant, and communication documented in the PACS or zVision Dashboard.   Electronically Signed   By: Abigail Miyamoto M.D.   On: 01/12/2019 08:17 NUCLEAR MEDICINE PET SKULL BASE TO THIGH  TECHNIQUE: 9.1 mCi F-18 FDG was injected intravenously. Full-ring PET imaging was performed from the skull base to thigh after the radiotracer. CT data was obtained and used for attenuation correction and anatomic localization.  Fasting blood glucose: 101 mg/dl  COMPARISON:  CT chest 01/11/2019  FINDINGS: Mediastinal blood pool activity: SUV max 2.3  NECK: No hypermetabolic lymph nodes in the neck.  Incidental CT findings: none  CHEST: The bilobed nodule versus adjacent nodules in the right upper lobe identified  on recent lung cancer screening CT demonstrates hypermetabolism with SUV max = 3.2.  No hypermetabolic right axillary or mediastinal lymphadenopathy. No unexpected hypermetabolic disease evident elsewhere in the chest.  Incidental CT findings: Atherosclerotic calcification is noted in the wall of the thoracic aorta. Coronary artery calcification is evident.  ABDOMEN/PELVIS: No abnormal hypermetabolic activity within the liver, pancreas, adrenal glands, or spleen. No hypermetabolic lymph nodes in the abdomen or pelvis.  Incidental CT findings: There is abdominal aortic atherosclerosis without aneurysm. Diverticular changes are noted in the left colon.  SKELETON: No focal hypermetabolic activity to suggest skeletal metastasis.  Incidental CT findings: none  IMPRESSION: 1. The new bilobed nodule versus adjacent 2 nodules identified in the right upper lobe on recent lung cancer screening CT show low level hypermetabolism. Neoplasm is a distinct concern. 2. No evidence for hypermetabolic metastases in the right hilum or mediastinum. No additional unexpected hypermetabolic disease in the neck, chest, abdomen, or pelvis. 3.  Aortic Atherosclerois (ICD10-170.0)   Electronically Signed   By: Misty Stanley M.D.   On: 01/15/2019 16:07 I personally reviewed the CT and PET/CT images and concur with the findings noted above  Pulmonary function testing 01/2018 FVC 1.93 (68%) FEV1 1.59 (74%) No improvement with bronchodilator TLC 4.84 (101%) RV 2.90 (141%) DLCO 14.03 (65%)  6 Minute walk test-980 feet heart rate 96-144, oxygen saturation 92% pre-and post  Impression: Tonya Bradley is an 68 year old woman with a long history of tobacco abuse, COPD, home  oxygen use, coronary disease, thoracic aortic atherosclerosis, atrial flutter and fibrillation, hyperlipidemia, reflux, osteoporosis, depression, anxiety, anemia, and arthritis.  She recently had a low-dose lung cancer screening  study which showed a new right upper lobe lung nodule that was not there in May 2018.  This appears to be a bilobed single nodule although it is possible there are 2 adjacent nodules.  The larger component is 1.2 cm the smaller 9 mm.  On PET CT the area was active with an SUV of 3.2.  There was no evidence of regional or distant metastatic disease.  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  Benign tumors extraordinarily unlikely given the short interval from her previous scan.  Infectious and inflammatory nodules are within the differential diagnosis but are far less likely.  This has to be considered lung cancer unless we can prove otherwise.  Having a hard time reconciling her need for home oxygen at night with her pulmonary function testing.  Although her testing was a year old, her diffusion capacity and flows are not that significantly impaired.  I did a 6-minute walk test on her in the office and she did well with that with no desaturation.  She was able to go about 1000 feet.  Based on that she should be able to tolerate a resection.  I discussed the proposed operative procedure right VATS for right upper lobectomy with Tonya Bradley and her husband.  I informed him of the general nature of the procedure, the incisions to be used, the use of a drainage tube postoperatively, the expected hospital stay, and the overall recovery.  I informed him of the indications, risks, benefits, and alternatives.  They understand the risks include, but are not limited to death, MI, DVT, PE, stroke, bleeding, possible need for transfusion, infection, prolonged air leak, cardiac arrhythmias, as well as the possibility of other unforeseeable complications.  She is relatively high risk given her history of cardiac arrhythmias, history of coronary disease and home oxygen use.  She does understand that the operation will have a negative impact on her respiratory capacity.  She saw Dr. Lovena Le  recently.  He did not think any further cardiac work-up was necessary prior to surgery.  Plan: Repeat pulmonary function testing Right VATS for right upper lobectomy on Thursday 02/04/2019.  Melrose Nakayama, MD [facility] [phone]

## 2019-01-27 NOTE — Telephone Encounter (Signed)
Called pharmacy and authorized early refill per Dr. Martinique. Patient informed.

## 2019-01-27 NOTE — H&P (View-Only) (Signed)
PCP is Martinique, [name], MD Referring Provider is Icard, Octavio Graves, DO  Chief Complaint  Patient presents with  . Lung Lesion    Surgical eval, PET Scan 01/15/19, Chest CT 01/11/19, Abd/Pelvis CT 01/01/19, PFT's 02/03/18    HPI: Tonya Bradley is sent for consultation regarding a right upper lobe lung nodule   Tonya Bradley is a 68 yo woman with a history of tobacco abuse, COPD, home O2 at night, atrial flutter status post ablation, atrial fibrillation, coronary artery disease status post stent in 2000, hyperlipidemia, reflux, osteoporosis, depression, anxiety, anemia, and arthritis.  She started in the lung cancer screening program and May 2018.  Her scan at that time was unremarkable.  She recently had a repeat CT which showed bilobed right upper lobe pulmonary nodule.  The medial portion was 12.6 mm and the more lateral portion 9.9 mm.  Also possible this is 2 separate nodules.  The more proximal nodule is on an airway so it could be post obstructive phenomenon as well.  There was mild centrilobular emphysema, severe thoracic aortic atherosclerosis and coronary atherosclerosis.  There was no mediastinal or hilar adenopathy.  She smoked about 1.5 packs a day for 50 years.  She quit smoking about a week ago after starting Chantix.  She uses home oxygen at night and is on 3 L.  She says she seldom uses it during the day but does usually take it with her.  Her appetite is good.  Weight is stable.  Denies any chest pain, pressure, or tightness.  She is on low-dose amiodarone for atrial fibrillation.  She saw Dr. Lovena Le recently.  Not had any unusual headaches or visual changes.  No new bone or joint pain.  Quit smoking 01/21/2019  Zubrod Score: At the time of surgery this patient's most appropriate activity status/level should be described as: []     0    Normal activity, no symptoms [x]     1    Restricted in physical strenuous activity but ambulatory, able to do out light work []     2    Ambulatory and capable  of self care, unable to do work activities, up and about >50 % of waking hours                              []     3    Only limited self care, in bed greater than 50% of waking hours []     4    Completely disabled, no self care, confined to bed or chair []     5    Moribund  Past Medical History:  Diagnosis Date  . Anemia   . Anxiety   . Arthritis    "back"  . Atrial flutter ([place]) 03/13/2016  . CAD (coronary artery disease)   . Cancer ([place])    skin cancer  . Cataract   . Colon polyp   . COPD (chronic obstructive pulmonary disease) ([place])   . Depression   . Dysrhythmia   . Emphysema of lung ([place])   . GERD (gastroesophageal reflux disease)   . Hypercholesterolemia   . Osteoporosis   . Oxygen deficiency     Past Surgical History:  Procedure Laterality Date  . A-FLUTTER ABLATION N/A 05/19/2017   Procedure: A-Flutter Ablation;  Surgeon: Evans Lance, MD;  Location: [facility];  Service: Cardiovascular;  Laterality: N/A;  . APPENDECTOMY    . BASAL CELL CARCINOMA  EXCISION    . CARDIAC CATHETERIZATION    . CATARACT EXTRACTION Bilateral   . CHOLECYSTECTOMY    . COLONOSCOPY N/A 06/23/2014   Dr. Rourk:Rectal and colonic polyps-removed, Pancolonic diverticulosis. lymphocytic colitis  . CORONARY ANGIOPLASTY WITH STENT PLACEMENT    . ESOPHAGEAL DILATION N/A 03/13/2016   Procedure: ESOPHAGEAL DILATION;  Surgeon: Daneil Dolin, MD;  [facility];  Service: Endoscopy;  Laterality: N/A;  . ESOPHAGOGASTRODUODENOSCOPY N/A 03/13/2016   Procedure: ESOPHAGOGASTRODUODENOSCOPY (EGD);  Surgeon: Daneil Dolin, MD;  [facility];  Service: Endoscopy;  Laterality: N/A;  0930  . EYE SURGERY     cataract removed, bilateral  . LUMBAR LAMINECTOMY/DECOMPRESSION MICRODISCECTOMY N/A 01/19/2018   Procedure: L4-5 DECOMPRESSION;  Surgeon: Marybelle Killings, MD;  Location: [facility];  Service: Orthopedics;  Laterality: N/A;    Family History  Problem Relation Age of Onset  . Breast  cancer Sister 69  . Heart disease Sister   . Heart attack Father   . Heart disease Father   . Stroke Mother   . Heart disease Mother   . Hyperlipidemia Mother   . Heart attack Sister   . Heart disease Brother        ?heart failure  . Congestive Heart Failure Brother   . Hypertension Son   . Heart disease Maternal Grandfather   . Cancer Cousin        lymphoma  . Hypertension Son   . Stomach cancer Other   . Colon cancer Neg Hx   . Pancreatic cancer Neg Hx     Social History Social History   Tobacco Use  . Smoking status: Current Every Day Smoker    Packs/day: 1.75    Years: 54.00    Pack years: 94.50    Types: Cigarettes    Start date: 06/14/1966  . Smokeless tobacco: Never Used  . Tobacco comment: 1-2 packs daily. Chantix too expensive  Substance Use Topics  . Alcohol use: No    Alcohol/week: 0.0 standard drinks  . Drug use: No    Current Outpatient Medications  Medication Sig Dispense Refill  . ALPRAZolam (XANAX) 0.5 MG tablet Take 1 tablet (0.5 mg total) by mouth 2 (two) times daily as needed for anxiety. 60 tablet 1  . amiodarone (PACERONE) 200 MG tablet Take 1/2 tablet (100 mg) by mouth once daily (Patient taking differently: Take 100 mg by mouth daily. Take 1/2 tablet (100 mg) by mouth once daily) 90 tablet 3  . aspirin EC 81 MG tablet Take 81 mg by mouth daily.    Marland Kitchen atorvastatin (LIPITOR) 80 MG tablet TAKE 1 TABLET(80 MG) BY MOUTH DAILY 90 tablet 1  . Cholecalciferol (VITAMIN D) 2000 units CAPS Take 2,000 Units by mouth daily.    . cycloSPORINE (RESTASIS) 0.05 % ophthalmic emulsion Place 1 drop into both eyes 2 (two) times daily.    Marland Kitchen DEXILANT 30 MG capsule TAKE 1 CAPSULE(30 MG) BY MOUTH DAILY 90 capsule 2  . diltiazem (CARDIZEM CD) 120 MG 24 hr capsule TAKE 1 CAPSULE(120 MG) BY MOUTH DAILY 90 capsule 2  . DULoxetine (CYMBALTA) 60 MG capsule Take 1 capsule (60 mg total) by mouth daily. (Patient taking differently: Take 60 mg by mouth at bedtime. ) 90 capsule 1   . furosemide (LASIX) 20 MG tablet 1 tab 3 times per day. 90 tablet 3  . LINZESS 290 MCG CAPS capsule TAKE 1 CAPSULE(290 MCG) BY MOUTH DAILY BEFORE BREAKFAST 30 capsule 0  . Multiple Vitamin (  MULTIVITAMIN) tablet Take 1 tablet by mouth daily.    . nitroGLYCERIN (NITROSTAT) 0.4 MG SL tablet Place 1 tablet (0.4 mg total) under the tongue every 5 (five) minutes as needed for chest pain. Reported on 06/12/2016 30 tablet 2  . potassium chloride (K-DUR) 10 MEQ tablet TAKE 1 TABLET(10 MEQ) BY MOUTH DAILY 90 tablet 2  . rOPINIRole (REQUIP) 0.25 MG tablet Take 2 tablets (0.5 mg total) by mouth at bedtime. 180 tablet 0  . terbinafine (LAMISIL) 250 MG tablet Take 250 mg by mouth daily.    . TRELEGY ELLIPTA 100-62.5-25 MCG/INH AEPB INHALE 1 PUFF INTO THE LUNGS DAILY 60 each 3  . varenicline (CHANTIX PAK) 0.5 MG X 11 & 1 MG X 42 tablet Take one 0.5 mg tablet by mouth once daily for 3 days, then increase to one 0.5 mg tablet twice daily for 4 days, then increase to one 1 mg tablet twice daily. 53 tablet 0  . VENTOLIN HFA 108 (90 Base) MCG/ACT inhaler INHALE 2 PUFFS INTO THE LUNGS EVERY 6 HOURS AS NEEDED FOR WHEEZING OR SHORTNESS OF BREATH 18 g 0   No current facility-administered medications for this visit.     No Known Allergies  Review of Systems  Constitutional: Positive for fatigue and unexpected weight change (Lost 5 pounds in 3 months). Negative for activity change and appetite change.  HENT: Positive for dental problem (Dentures). Negative for trouble swallowing and voice change.   Eyes: Negative for visual disturbance.  Respiratory: Positive for cough and shortness of breath (With exertion). Negative for wheezing.        Home oxygen at night  Cardiovascular: Positive for leg swelling. Negative for chest pain.  Gastrointestinal: Positive for blood in stool (None recent). Negative for abdominal distention and abdominal pain.  Genitourinary: Negative for difficulty urinating and dysuria.   Musculoskeletal: Positive for arthralgias, joint swelling and myalgias.  Neurological: Negative for seizures, syncope and headaches.  Hematological: Negative for adenopathy. Does not bruise/bleed easily.  Psychiatric/Behavioral: The patient is nervous/anxious.     BP 112/69   Pulse 96   Resp 20   Ht 5\' 2"  (1.575 m)   Wt 163 lb (73.9 kg)   SpO2 92% Comment: RA  BMI 29.81 kg/m  Physical Exam Vitals signs reviewed.  Constitutional:      General: She is not in acute distress.    Appearance: Normal appearance.  HENT:     Head: Normocephalic and atraumatic.     Mouth/Throat:     Pharynx: Oropharynx is clear.  Eyes:     General: No scleral icterus.    Extraocular Movements: Extraocular movements intact.     Conjunctiva/sclera: Conjunctivae normal.  Neck:     Musculoskeletal: Neck supple.  Cardiovascular:     Rate and Rhythm: Normal rate. Rhythm irregular.     Heart sounds: No murmur. No friction rub. No gallop.   Pulmonary:     Effort: Pulmonary effort is normal. No respiratory distress.     Breath sounds: Normal breath sounds. No wheezing or rales.  Abdominal:     General: There is no distension.     Palpations: Abdomen is soft.     Tenderness: There is no abdominal tenderness.  Musculoskeletal:     Comments: Venous stasis changes bilateral lower extremities  Lymphadenopathy:     Cervical: No cervical adenopathy.  Skin:    General: Skin is warm and dry.  Neurological:     General: No focal deficit present.  Mental Status: She is alert and oriented to person, place, and time.     Cranial Nerves: No cranial nerve deficit.     Motor: No weakness.    Diagnostic Tests: CT CHEST WITHOUT CONTRAST LOW-DOSE FOR LUNG CANCER SCREENING  TECHNIQUE: Multidetector CT imaging of the chest was performed following the standard protocol without IV contrast.  COMPARISON:  04/03/2017.  Chest radiograph 01/14/2018.  FINDINGS: Cardiovascular: Aortic and branch vessel  atherosclerosis. Tortuous thoracic aorta. Mild cardiomegaly with lipomatous hypertrophy of the interatrial septum. Multivessel coronary artery atherosclerosis.  Mediastinum/Nodes: No mediastinal or definite hilar adenopathy, given limitations of unenhanced CT. Tiny hiatal hernia.  Lungs/Pleura: No pleural fluid. Mild centrilobular emphysema mild biapical pleuroparenchymal scarring.  Two adjacent versus 1 bilobed right upper lobe pulmonary nodule/nodules, new. The more lateral portion measures volume derived equivalent diameter 9.9 mm. The more medial portion measures volume derived equivalent diameter 12.6 mm  Upper Abdomen: Normal imaged portions of the liver, spleen, pancreas, adrenal glands, kidneys  Musculoskeletal: No acute osseous abnormality.  IMPRESSION: 1. Lung-RADS 4B, suspicious. Additional imaging evaluation or consultation with Pulmonology or Thoracic Surgery recommended. One bilobed versus 2 adjacent right upper lobe pulmonary nodule/nodules, new. 2. Aortic atherosclerosis (ICD10-I70.0), coronary artery atherosclerosis and emphysema (ICD10-J43.9).  These results will be called to the ordering clinician or representative by the Radiologist Assistant, and communication documented in the PACS or zVision Dashboard.   Electronically Signed   By: Abigail Miyamoto M.D.   On: 01/12/2019 08:17 NUCLEAR MEDICINE PET SKULL BASE TO THIGH  TECHNIQUE: 9.1 mCi F-18 FDG was injected intravenously. Full-ring PET imaging was performed from the skull base to thigh after the radiotracer. CT data was obtained and used for attenuation correction and anatomic localization.  Fasting blood glucose: 101 mg/dl  COMPARISON:  CT chest 01/11/2019  FINDINGS: Mediastinal blood pool activity: SUV max 2.3  NECK: No hypermetabolic lymph nodes in the neck.  Incidental CT findings: none  CHEST: The bilobed nodule versus adjacent nodules in the right upper lobe identified  on recent lung cancer screening CT demonstrates hypermetabolism with SUV max = 3.2.  No hypermetabolic right axillary or mediastinal lymphadenopathy. No unexpected hypermetabolic disease evident elsewhere in the chest.  Incidental CT findings: Atherosclerotic calcification is noted in the wall of the thoracic aorta. Coronary artery calcification is evident.  ABDOMEN/PELVIS: No abnormal hypermetabolic activity within the liver, pancreas, adrenal glands, or spleen. No hypermetabolic lymph nodes in the abdomen or pelvis.  Incidental CT findings: There is abdominal aortic atherosclerosis without aneurysm. Diverticular changes are noted in the left colon.  SKELETON: No focal hypermetabolic activity to suggest skeletal metastasis.  Incidental CT findings: none  IMPRESSION: 1. The new bilobed nodule versus adjacent 2 nodules identified in the right upper lobe on recent lung cancer screening CT show low level hypermetabolism. Neoplasm is a distinct concern. 2. No evidence for hypermetabolic metastases in the right hilum or mediastinum. No additional unexpected hypermetabolic disease in the neck, chest, abdomen, or pelvis. 3.  Aortic Atherosclerois (ICD10-170.0)   Electronically Signed   By: Misty Stanley M.D.   On: 01/15/2019 16:07 I personally reviewed the CT and PET/CT images and concur with the findings noted above  Pulmonary function testing 01/2018 FVC 1.93 (68%) FEV1 1.59 (74%) No improvement with bronchodilator TLC 4.84 (101%) RV 2.90 (141%) DLCO 14.03 (65%)  6 Minute walk test-980 feet heart rate 96-144, oxygen saturation 92% pre-and post  Impression: Tonya Bradley is an 68 year old woman with a long history of tobacco abuse, COPD, home  oxygen use, coronary disease, thoracic aortic atherosclerosis, atrial flutter and fibrillation, hyperlipidemia, reflux, osteoporosis, depression, anxiety, anemia, and arthritis.  She recently had a low-dose lung cancer screening  study which showed a new right upper lobe lung nodule that was not there in May 2018.  This appears to be a bilobed single nodule although it is possible there are 2 adjacent nodules.  The larger component is 1.2 cm the smaller 9 mm.  On PET CT the area was active with an SUV of 3.2.  There was no evidence of regional or distant metastatic disease.  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  Benign tumors extraordinarily unlikely given the short interval from her previous scan.  Infectious and inflammatory nodules are within the differential diagnosis but are far less likely.  This has to be considered lung cancer unless we can prove otherwise.  Having a hard time reconciling her need for home oxygen at night with her pulmonary function testing.  Although her testing was a year old, her diffusion capacity and flows are not that significantly impaired.  I did a 6-minute walk test on her in the office and she did well with that with no desaturation.  She was able to go about 1000 feet.  Based on that she should be able to tolerate a resection.  I discussed the proposed operative procedure right VATS for right upper lobectomy with Mrs. Cabler and her husband.  I informed him of the general nature of the procedure, the incisions to be used, the use of a drainage tube postoperatively, the expected hospital stay, and the overall recovery.  I informed him of the indications, risks, benefits, and alternatives.  They understand the risks include, but are not limited to death, MI, DVT, PE, stroke, bleeding, possible need for transfusion, infection, prolonged air leak, cardiac arrhythmias, as well as the possibility of other unforeseeable complications.  She is relatively high risk given her history of cardiac arrhythmias, history of coronary disease and home oxygen use.  She does understand that the operation will have a negative impact on her respiratory capacity.  She saw Dr. Lovena Le  recently.  He did not think any further cardiac work-up was necessary prior to surgery.  Plan: Repeat pulmonary function testing Right VATS for right upper lobectomy on Thursday 02/04/2019.  Melrose Nakayama, MD [facility] [phone]

## 2019-01-27 NOTE — Progress Notes (Signed)
Dr. [name] aware

## 2019-01-28 ENCOUNTER — Encounter: Payer: Self-pay | Admitting: *Deleted

## 2019-01-28 ENCOUNTER — Telehealth: Payer: Self-pay | Admitting: Physical Medicine & Rehabilitation

## 2019-01-28 ENCOUNTER — Other Ambulatory Visit: Payer: Self-pay | Admitting: *Deleted

## 2019-01-28 DIAGNOSIS — R911 Solitary pulmonary nodule: Secondary | ICD-10-CM

## 2019-01-28 NOTE — Telephone Encounter (Signed)
Do you mean half of lung? We can reschedule whenever she recovers and is able to lay prone

## 2019-01-28 NOTE — Telephone Encounter (Signed)
Husband clled and states ptn having surgery to remove half of lunch wants MD to know will reschedule as he can

## 2019-01-29 NOTE — Telephone Encounter (Signed)
FYI sent to Dr. Martinique for review. Copied from [place] [phone]. Topic: General - Other >> Jan 28, 2019  8:56 AM Oneta Rack wrote: Caller name:  Relation to pt: spouse  Call back number: [phone]   Reason for call: Spouse wanted to inform PCP of patient upcoming surgery at [facility] : surgery removal of top right portion of lung on 02/04/2019

## 2019-02-01 NOTE — Pre-Procedure Instructions (Signed)
Tonya Bradley  02/01/2019      [id number] [address], [address] [place] [address] Phone: [phone] Fax: [facility] [place], [address] [place] [address] Phone: [phone] Fax: [phone]    Your procedure is scheduled on February 04, 2019.  Report to [facility] Entrance "A" at 600 AM..  Call this number if you have problems the morning of surgery:  [phone]   Remember:  Do not eat or drink after midnight.    Take these medicines the morning of surgery with A SIP OF WATER  amiodarone (PACERONE)  cycloSPORINE (RESTASIS) 0.05 %  DEXILANT  diltiazem (CARDIZEM CD) DULoxetine (CYMBALTA)  TRELEGY ELLIPTA -inhaler  IF needed: LINZESS nitroGLYCERIN (NITROSTAT) VENTOLIN HFA 108 (90 Base) MCG/ACT inhaler-bring with you  Follow your surgeon's instructions on when to hold/resume aspirin.  If no instructions were given call the office to determine how they would like to you take aspirin  7 days prior to surgery STOP taking any Aleve, Naproxen, Ibuprofen, Motrin, Advil, Goody's, BC's, all herbal medications, fish oil, and all vitamins    Do not wear jewelry, make-up or nail polish.  Do not wear lotions, powders, or perfumes, or deodorant.  Do not shave 48 hours prior to surgery.    Do not bring valuables to the hospital.  [facility] is not responsible for any belongings or valuables.  Contacts, dentures or bridgework may not be worn into surgery.  Leave your suitcase in the car.  After surgery it may be brought to your room.  For patients admitted to the hospital, discharge time will be determined by your treatment team.  Patients discharged the day of surgery will not be allowed to drive home.    [place]- Preparing For Surgery  Before surgery, you can play an important role. Because skin is not sterile,  your skin needs to be as free of germs as possible. You can reduce the number of germs on your skin by washing with CHG (chlorahexidine gluconate) Soap before surgery.  CHG is an antiseptic cleaner which kills germs and bonds with the skin to continue killing germs even after washing.    Oral Hygiene is also important to reduce your risk of infection.  Remember - BRUSH YOUR TEETH THE MORNING OF SURGERY WITH YOUR REGULAR TOOTHPASTE  Please do not use if you have an allergy to CHG or antibacterial soaps. If your skin becomes reddened/irritated stop using the CHG.  Do not shave (including legs and underarms) for at least 48 hours prior to first CHG shower. It is OK to shave your face.  Please follow these instructions carefully.   1. Shower the NIGHT BEFORE SURGERY and the MORNING OF SURGERY with CHG.   2. If you chose to wash your hair, wash your hair first as usual with your normal shampoo.  3. After you shampoo, rinse your hair and body thoroughly to remove the shampoo.  4. Use CHG as you would any other liquid soap. You can apply CHG directly to the skin and wash gently with a scrungie or a clean washcloth.   5. Apply the CHG Soap to your body ONLY FROM THE NECK DOWN.  Do not use on open wounds or open sores. Avoid contact with your eyes, ears, mouth and genitals (private parts). Wash Face and genitals (  private parts)  with your normal soap.  6. Wash thoroughly, paying special attention to the area where your surgery will be performed.  7. Thoroughly rinse your body with warm water from the neck down.  8. DO NOT shower/wash with your normal soap after using and rinsing off the CHG Soap.  9. Pat yourself dry with a CLEAN TOWEL.  10. Wear CLEAN PAJAMAS to bed the night before surgery, wear comfortable clothes the morning of surgery  11. Place CLEAN SHEETS on your bed the night of your first shower and DO NOT SLEEP WITH PETS.  Day of Surgery:  Do not apply any deodorants/lotions.   Please wear clean clothes to the hospital/surgery center.   Remember to brush your teeth WITH YOUR REGULAR TOOTHPASTE.   Please read over the following fact sheets that you were given.

## 2019-02-02 ENCOUNTER — Encounter (HOSPITAL_COMMUNITY): Payer: Self-pay

## 2019-02-02 ENCOUNTER — Ambulatory Visit (HOSPITAL_COMMUNITY)
Admission: RE | Admit: 2019-02-02 | Discharge: 2019-02-02 | Disposition: A | Discharge status: Home / Self Care | Payer: Medicare HMO | Source: Ambulatory Visit | Attending: Thoracic Surgery (Cardiothoracic Vascular Surgery) | Admitting: Thoracic Surgery (Cardiothoracic Vascular Surgery)

## 2019-02-02 ENCOUNTER — Other Ambulatory Visit: Payer: Self-pay

## 2019-02-02 ENCOUNTER — Encounter (HOSPITAL_COMMUNITY)
Admission: RE | Admit: 2019-02-02 | Discharge: 2019-02-02 | Disposition: A | Discharge status: Home / Self Care | Payer: Medicare HMO | Source: Ambulatory Visit | Attending: Thoracic Surgery (Cardiothoracic Vascular Surgery) | Admitting: Thoracic Surgery (Cardiothoracic Vascular Surgery)

## 2019-02-02 DIAGNOSIS — J939 Pneumothorax, unspecified: Secondary | ICD-10-CM | POA: Diagnosis not present

## 2019-02-02 DIAGNOSIS — K5900 Constipation, unspecified: Secondary | ICD-10-CM | POA: Diagnosis not present

## 2019-02-02 DIAGNOSIS — I2510 Atherosclerotic heart disease of native coronary artery without angina pectoris: Secondary | ICD-10-CM | POA: Diagnosis present

## 2019-02-02 DIAGNOSIS — C3411 Malignant neoplasm of upper lobe, right bronchus or lung: Secondary | ICD-10-CM | POA: Diagnosis present

## 2019-02-02 DIAGNOSIS — M810 Age-related osteoporosis without current pathological fracture: Secondary | ICD-10-CM | POA: Diagnosis present

## 2019-02-02 DIAGNOSIS — I440 Atrioventricular block, first degree: Secondary | ICD-10-CM | POA: Diagnosis not present

## 2019-02-02 DIAGNOSIS — F17210 Nicotine dependence, cigarettes, uncomplicated: Secondary | ICD-10-CM | POA: Diagnosis present

## 2019-02-02 DIAGNOSIS — H26491 Other secondary cataract, right eye: Secondary | ICD-10-CM | POA: Diagnosis not present

## 2019-02-02 DIAGNOSIS — H524 Presbyopia: Secondary | ICD-10-CM | POA: Diagnosis not present

## 2019-02-02 DIAGNOSIS — R911 Solitary pulmonary nodule: Secondary | ICD-10-CM | POA: Diagnosis present

## 2019-02-02 DIAGNOSIS — R918 Other nonspecific abnormal finding of lung field: Secondary | ICD-10-CM | POA: Diagnosis not present

## 2019-02-02 DIAGNOSIS — H353111 Nonexudative age-related macular degeneration, right eye, early dry stage: Secondary | ICD-10-CM | POA: Diagnosis not present

## 2019-02-02 DIAGNOSIS — Z9981 Dependence on supplemental oxygen: Secondary | ICD-10-CM | POA: Diagnosis not present

## 2019-02-02 DIAGNOSIS — D649 Anemia, unspecified: Secondary | ICD-10-CM | POA: Diagnosis not present

## 2019-02-02 DIAGNOSIS — J948 Other specified pleural conditions: Secondary | ICD-10-CM | POA: Diagnosis not present

## 2019-02-02 DIAGNOSIS — J9611 Chronic respiratory failure with hypoxia: Secondary | ICD-10-CM | POA: Diagnosis present

## 2019-02-02 DIAGNOSIS — J9811 Atelectasis: Secondary | ICD-10-CM | POA: Diagnosis present

## 2019-02-02 DIAGNOSIS — M1990 Unspecified osteoarthritis, unspecified site: Secondary | ICD-10-CM | POA: Diagnosis present

## 2019-02-02 DIAGNOSIS — H353121 Nonexudative age-related macular degeneration, left eye, early dry stage: Secondary | ICD-10-CM | POA: Diagnosis not present

## 2019-02-02 DIAGNOSIS — F419 Anxiety disorder, unspecified: Secondary | ICD-10-CM | POA: Diagnosis present

## 2019-02-02 DIAGNOSIS — Y92009 Unspecified place in unspecified non-institutional (private) residence as the place of occurrence of the external cause: Secondary | ICD-10-CM | POA: Diagnosis not present

## 2019-02-02 DIAGNOSIS — F329 Major depressive disorder, single episode, unspecified: Secondary | ICD-10-CM | POA: Diagnosis present

## 2019-02-02 DIAGNOSIS — J95811 Postprocedural pneumothorax: Secondary | ICD-10-CM | POA: Diagnosis not present

## 2019-02-02 DIAGNOSIS — M48062 Spinal stenosis, lumbar region with neurogenic claudication: Secondary | ICD-10-CM | POA: Diagnosis present

## 2019-02-02 DIAGNOSIS — R0602 Shortness of breath: Secondary | ICD-10-CM | POA: Diagnosis not present

## 2019-02-02 DIAGNOSIS — R0902 Hypoxemia: Secondary | ICD-10-CM | POA: Diagnosis not present

## 2019-02-02 DIAGNOSIS — Z4682 Encounter for fitting and adjustment of non-vascular catheter: Secondary | ICD-10-CM | POA: Diagnosis not present

## 2019-02-02 DIAGNOSIS — E785 Hyperlipidemia, unspecified: Secondary | ICD-10-CM | POA: Diagnosis present

## 2019-02-02 DIAGNOSIS — M8580 Other specified disorders of bone density and structure, unspecified site: Secondary | ICD-10-CM | POA: Diagnosis present

## 2019-02-02 DIAGNOSIS — E876 Hypokalemia: Secondary | ICD-10-CM | POA: Diagnosis not present

## 2019-02-02 DIAGNOSIS — J449 Chronic obstructive pulmonary disease, unspecified: Secondary | ICD-10-CM | POA: Diagnosis present

## 2019-02-02 DIAGNOSIS — Z961 Presence of intraocular lens: Secondary | ICD-10-CM | POA: Diagnosis not present

## 2019-02-02 DIAGNOSIS — I480 Paroxysmal atrial fibrillation: Secondary | ICD-10-CM | POA: Diagnosis present

## 2019-02-02 DIAGNOSIS — I898 Other specified noninfective disorders of lymphatic vessels and lymph nodes: Secondary | ICD-10-CM | POA: Diagnosis not present

## 2019-02-02 DIAGNOSIS — E669 Obesity, unspecified: Secondary | ICD-10-CM | POA: Diagnosis present

## 2019-02-02 DIAGNOSIS — K219 Gastro-esophageal reflux disease without esophagitis: Secondary | ICD-10-CM | POA: Diagnosis present

## 2019-02-02 DIAGNOSIS — R05 Cough: Secondary | ICD-10-CM | POA: Diagnosis not present

## 2019-02-02 DIAGNOSIS — G92 Toxic encephalopathy: Secondary | ICD-10-CM | POA: Diagnosis not present

## 2019-02-02 DIAGNOSIS — T17900A Unspecified foreign body in respiratory tract, part unspecified causing asphyxiation, initial encounter: Secondary | ICD-10-CM | POA: Diagnosis not present

## 2019-02-02 DIAGNOSIS — T44995A Adverse effect of other drug primarily affecting the autonomic nervous system, initial encounter: Secondary | ICD-10-CM | POA: Diagnosis not present

## 2019-02-02 DIAGNOSIS — J9809 Other diseases of bronchus, not elsewhere classified: Secondary | ICD-10-CM | POA: Diagnosis present

## 2019-02-02 DIAGNOSIS — J942 Hemothorax: Secondary | ICD-10-CM | POA: Diagnosis not present

## 2019-02-02 DIAGNOSIS — J90 Pleural effusion, not elsewhere classified: Secondary | ICD-10-CM | POA: Diagnosis present

## 2019-02-02 HISTORY — DX: Other complications of anesthesia, initial encounter: T88.59XA

## 2019-02-02 HISTORY — DX: Adverse effect of unspecified anesthetic, initial encounter: T41.45XA

## 2019-02-02 LAB — COMPREHENSIVE METABOLIC PANEL
ALT: 27 U/L (ref 0–44)
AST: 24 U/L (ref 15–41)
Albumin: 3.5 g/dL (ref 3.5–5.0)
Alkaline Phosphatase: 118 U/L (ref 38–126)
Anion gap: 12 (ref 5–15)
BUN: 15 mg/dL (ref 8–23)
CO2: 23 mmol/L (ref 22–32)
Calcium: 9.3 mg/dL (ref 8.9–10.3)
Chloride: 103 mmol/L (ref 98–111)
Creatinine, Ser: 0.51 mg/dL (ref 0.44–1.00)
GFR calc Af Amer: >60 mL/min (ref >60)
GFR calc non Af Amer: >60 mL/min (ref >60)
Glucose, Bld: 111 mg/dL — ABNORMAL HIGH (ref 70–99)
Potassium: 3.3 mmol/L — ABNORMAL LOW (ref 3.5–5.1)
Sodium: 138 mmol/L (ref 135–145)
TOTAL PROTEIN: 6.7 g/dL (ref 6.5–8.1)
Total Bilirubin: 0.3 mg/dL (ref 0.3–1.2)

## 2019-02-02 LAB — PULMONARY FUNCTION TEST
DL/VA % pred: 120 %
DL/VA: 5.07 ml/min/mmHg/L
DLCO cor % pred: 94 %
DLCO cor: 17.27 ml/min/mmHg
DLCO unc % pred: 92 %
DLCO unc: 16.94 ml/min/mmHg
FEF 25-75 Post: 1.64 L/sec
FEF 25-75 Pre: 2.17 L/sec
FEF2575-%Change-Post: -24 %
FEF2575-%Pred-Post: 87 %
FEF2575-%Pred-Pre: 115 %
FEV1-%Change-Post: -4 %
FEV1-%PRED-POST: 84 %
FEV1-%Pred-Pre: 88 %
FEV1-POST: 1.81 L
FEV1-Pre: 1.9 L
FEV1FVC-%Change-Post: 4 %
FEV1FVC-%Pred-Pre: 108 %
FEV6-%CHANGE-POST: -8 %
FEV6-%Pred-Post: 77 %
FEV6-%Pred-Pre: 84 %
FEV6-Post: 2.1 L
FEV6-Pre: 2.29 L
FEV6FVC-%PRED-PRE: 104 %
FEV6FVC-%Pred-Post: 104 %
FVC-%Change-Post: -8 %
FVC-%Pred-Post: 74 %
FVC-%Pred-Pre: 81 %
FVC-Post: 2.1 L
FVC-Pre: 2.29 L
Post FEV1/FVC ratio: 86 %
Post FEV6/FVC ratio: 100 %
Pre FEV1/FVC ratio: 83 %
Pre FEV6/FVC Ratio: 100 %
RV % pred: 124 %
RV: 2.55 L
TLC % PRED: 102 %
TLC: 4.87 L

## 2019-02-02 LAB — URINALYSIS, ROUTINE W REFLEX MICROSCOPIC
Glucose, UA: NEGATIVE mg/dL
HGB URINE DIPSTICK: NEGATIVE
Ketones, ur: 5 mg/dL — AB
Nitrite: NEGATIVE
Protein, ur: NEGATIVE mg/dL
Specific Gravity, Urine: 1.027 (ref 1.005–1.030)
pH: 5 (ref 5.0–8.0)

## 2019-02-02 LAB — BLOOD GAS, ARTERIAL
Acid-Base Excess: 4.4 mmol/L — ABNORMAL HIGH (ref 0.0–2.0)
Bicarbonate: 28.5 mmol/L — ABNORMAL HIGH (ref 20.0–28.0)
Drawn by: 470591
FIO2: 21
O2 Saturation: 93.5 %
Patient temperature: 98.6
pCO2 arterial: 43.3 mmHg (ref 32.0–48.0)
pH, Arterial: 7.433 (ref 7.350–7.450)
pO2, Arterial: 67.8 mmHg — ABNORMAL LOW (ref 83.0–108.0)

## 2019-02-02 LAB — APTT: aPTT: 29 seconds (ref 24–36)

## 2019-02-02 LAB — CBC
HCT: 40.3 % (ref 36.0–46.0)
Hemoglobin: 12.8 g/dL (ref 12.0–15.0)
MCH: 28.9 pg (ref 26.0–34.0)
MCHC: 31.8 g/dL (ref 30.0–36.0)
MCV: 91 fL (ref 80.0–100.0)
Platelets: 289 10*3/uL (ref 150–400)
RBC: 4.43 MIL/uL (ref 3.87–5.11)
RDW: 12.8 % (ref 11.5–15.5)
WBC: 9 10*3/uL (ref 4.0–10.5)
nRBC: 0 % (ref 0.0–0.2)

## 2019-02-02 LAB — PROTIME-INR
INR: 0.9 (ref 0.8–1.2)
PROTHROMBIN TIME: 11.9 s (ref 11.4–15.2)

## 2019-02-02 LAB — TYPE AND SCREEN
ABO/RH(D): O NEG
Antibody Screen: NEGATIVE

## 2019-02-02 LAB — SURGICAL PCR SCREEN
MRSA, PCR: NEGATIVE
Staphylococcus aureus: NEGATIVE

## 2019-02-02 LAB — ABO/RH: ABO/RH(D): O NEG

## 2019-02-02 MED ORDER — ALBUTEROL SULFATE (2.5 MG/3ML) 0.083% IN NEBU
2.5000 mg | INHALATION_SOLUTION | Freq: Once | RESPIRATORY_TRACT | Status: AC
  Administered 2019-02-02: 2.5 mg via RESPIRATORY_TRACT

## 2019-02-02 NOTE — Progress Notes (Signed)
PCP - Dr. Betty Martinique Cardiologist - Dr. Cristopher Peru, cleared patient for surgery on 01/26/19  Chest x-ray - 02/02/19 EKG - 01/26/19 Stress Test - 08/2017 ECHO -2017 Cardiac Cath -2000; with 1 stent.    Sleep Study - negative sleep studies in the past. CPAP - denies  Blood Thinner Instructions: N/A Aspirin Instructions: continue but none of DOS.   Anesthesia review: Yes, hx of CAD.   Patient denies shortness of breath, fever, cough and chest pain at PAT appointment   Patient verbalized understanding of instructions that were given to them at the PAT appointment. Patient was also instructed that they will need to review over the PAT instructions again at home before surgery.

## 2019-02-02 NOTE — Progress Notes (Signed)
Levonne Spiller, RN for Dr. [name] notified of patient's UA results. Will make surgeon aware.   Jacqlyn Larsen, RN

## 2019-02-02 NOTE — Telephone Encounter (Signed)
Yes lung surgery  Not lunch

## 2019-02-03 DIAGNOSIS — H524 Presbyopia: Secondary | ICD-10-CM | POA: Diagnosis not present

## 2019-02-03 DIAGNOSIS — H353111 Nonexudative age-related macular degeneration, right eye, early dry stage: Secondary | ICD-10-CM | POA: Diagnosis not present

## 2019-02-03 DIAGNOSIS — Z961 Presence of intraocular lens: Secondary | ICD-10-CM | POA: Diagnosis not present

## 2019-02-03 DIAGNOSIS — H353121 Nonexudative age-related macular degeneration, left eye, early dry stage: Secondary | ICD-10-CM | POA: Diagnosis not present

## 2019-02-03 DIAGNOSIS — H26491 Other secondary cataract, right eye: Secondary | ICD-10-CM | POA: Diagnosis not present

## 2019-02-03 NOTE — Anesthesia Preprocedure Evaluation (Addendum)
Anesthesia Evaluation  Patient identified by MRN, date of birth, ID band Patient awake    Reviewed: Allergy & Precautions, NPO status , Patient's Chart, lab work & pertinent test results  Airway Mallampati: II  TM Distance: >3 FB     Dental   Pulmonary COPD, Current Smoker,    breath sounds clear to auscultation       Cardiovascular + CAD  + dysrhythmias  Rhythm:Regular Rate:Normal     Neuro/Psych    GI/Hepatic Neg liver ROS, hiatal hernia, GERD  ,  Endo/Other  negative endocrine ROS  Renal/GU negative Renal ROS     Musculoskeletal   Abdominal   Peds  Hematology  (+) anemia ,   Anesthesia Other Findings   Reproductive/Obstetrics                           Anesthesia Physical Anesthesia Plan  ASA: III  Anesthesia Plan: General   Post-op Pain Management:    Induction: Intravenous  PONV Risk Score and Plan: Ondansetron, Dexamethasone and Midazolam  Airway Management Planned: Double Lumen EBT  Additional Equipment:   Intra-op Plan:   Post-operative Plan: Possible Post-op intubation/ventilation  Informed Consent:     Dental advisory given  Plan Discussed with: Anesthesiologist and CRNA  Anesthesia Plan Comments: (Follows with cardiology for hx of atrial fib/flutter s/p ablation, CAD s/p stent in 2000. Cleared by Dr. Cristopher Peru on 2/24 "Preoperative eval - she is pending surgical consultation. She would be low/mod risk and no additional testing is indicated. She had a low risk stress just over a year ago." Nuclear stress test 08/13/17 showed EF 59%, no ST deviation, no perfusion defects, no ischemia, low risk study.   Follows with Dr. Lamonte Sakai for COPD. Per last pulm OV note on 1/9 "Patient is using 2 L nasal cannula at daytime and 3 L nasal cannula at night per recent results of ONO." Per notes, pt does report using O2 at night but seldom during the day, only when she feels she  needs it.)      Anesthesia Quick Evaluation

## 2019-02-04 ENCOUNTER — Inpatient Hospital Stay (HOSPITAL_COMMUNITY): Payer: Medicare HMO

## 2019-02-04 ENCOUNTER — Inpatient Hospital Stay (HOSPITAL_COMMUNITY): Payer: Medicare HMO | Admitting: Physician Assistant

## 2019-02-04 ENCOUNTER — Inpatient Hospital Stay (HOSPITAL_COMMUNITY)
Admission: RE | Admit: 2019-02-04 | Discharge: 2019-02-18 | DRG: 163 | Disposition: A | Discharge status: Home Health | Payer: Medicare HMO | Attending: Thoracic Surgery (Cardiothoracic Vascular Surgery) | Admitting: Thoracic Surgery (Cardiothoracic Vascular Surgery)

## 2019-02-04 ENCOUNTER — Encounter (HOSPITAL_COMMUNITY): Payer: Self-pay | Admitting: General Practice

## 2019-02-04 ENCOUNTER — Encounter (HOSPITAL_COMMUNITY)
Admission: RE | Disposition: A | Discharge status: Home Health | Payer: Self-pay | Source: Home / Self Care | Attending: Thoracic Surgery (Cardiothoracic Vascular Surgery)

## 2019-02-04 ENCOUNTER — Inpatient Hospital Stay (HOSPITAL_COMMUNITY): Payer: Medicare HMO | Admitting: Certified Registered Nurse Anesthetist

## 2019-02-04 ENCOUNTER — Other Ambulatory Visit: Payer: Self-pay

## 2019-02-04 DIAGNOSIS — K5900 Constipation, unspecified: Secondary | ICD-10-CM | POA: Diagnosis not present

## 2019-02-04 DIAGNOSIS — G92 Toxic encephalopathy: Secondary | ICD-10-CM | POA: Diagnosis not present

## 2019-02-04 DIAGNOSIS — E876 Hypokalemia: Secondary | ICD-10-CM | POA: Diagnosis not present

## 2019-02-04 DIAGNOSIS — C3411 Malignant neoplasm of upper lobe, right bronchus or lung: Secondary | ICD-10-CM | POA: Diagnosis present

## 2019-02-04 DIAGNOSIS — Z955 Presence of coronary angioplasty implant and graft: Secondary | ICD-10-CM

## 2019-02-04 DIAGNOSIS — J90 Pleural effusion, not elsewhere classified: Secondary | ICD-10-CM | POA: Diagnosis present

## 2019-02-04 DIAGNOSIS — E785 Hyperlipidemia, unspecified: Secondary | ICD-10-CM | POA: Diagnosis present

## 2019-02-04 DIAGNOSIS — Z85828 Personal history of other malignant neoplasm of skin: Secondary | ICD-10-CM

## 2019-02-04 DIAGNOSIS — J9809 Other diseases of bronchus, not elsewhere classified: Secondary | ICD-10-CM | POA: Diagnosis present

## 2019-02-04 DIAGNOSIS — C3491 Malignant neoplasm of unspecified part of right bronchus or lung: Secondary | ICD-10-CM

## 2019-02-04 DIAGNOSIS — T44995A Adverse effect of other drug primarily affecting the autonomic nervous system, initial encounter: Secondary | ICD-10-CM | POA: Diagnosis not present

## 2019-02-04 DIAGNOSIS — F419 Anxiety disorder, unspecified: Secondary | ICD-10-CM | POA: Diagnosis present

## 2019-02-04 DIAGNOSIS — J181 Lobar pneumonia, unspecified organism: Secondary | ICD-10-CM

## 2019-02-04 DIAGNOSIS — J449 Chronic obstructive pulmonary disease, unspecified: Secondary | ICD-10-CM | POA: Diagnosis present

## 2019-02-04 DIAGNOSIS — Z4682 Encounter for fitting and adjustment of non-vascular catheter: Secondary | ICD-10-CM

## 2019-02-04 DIAGNOSIS — I2510 Atherosclerotic heart disease of native coronary artery without angina pectoris: Secondary | ICD-10-CM | POA: Diagnosis present

## 2019-02-04 DIAGNOSIS — Y92009 Unspecified place in unspecified non-institutional (private) residence as the place of occurrence of the external cause: Secondary | ICD-10-CM

## 2019-02-04 DIAGNOSIS — Z9889 Other specified postprocedural states: Secondary | ICD-10-CM

## 2019-02-04 DIAGNOSIS — R911 Solitary pulmonary nodule: Secondary | ICD-10-CM

## 2019-02-04 DIAGNOSIS — Z800 Family history of malignant neoplasm of digestive organs: Secondary | ICD-10-CM

## 2019-02-04 DIAGNOSIS — F329 Major depressive disorder, single episode, unspecified: Secondary | ICD-10-CM | POA: Diagnosis present

## 2019-02-04 DIAGNOSIS — I480 Paroxysmal atrial fibrillation: Secondary | ICD-10-CM | POA: Diagnosis present

## 2019-02-04 DIAGNOSIS — M8580 Other specified disorders of bone density and structure, unspecified site: Secondary | ICD-10-CM | POA: Diagnosis present

## 2019-02-04 DIAGNOSIS — Z807 Family history of other malignant neoplasms of lymphoid, hematopoietic and related tissues: Secondary | ICD-10-CM

## 2019-02-04 DIAGNOSIS — Z9981 Dependence on supplemental oxygen: Secondary | ICD-10-CM

## 2019-02-04 DIAGNOSIS — I898 Other specified noninfective disorders of lymphatic vessels and lymph nodes: Secondary | ICD-10-CM | POA: Diagnosis not present

## 2019-02-04 DIAGNOSIS — M48062 Spinal stenosis, lumbar region with neurogenic claudication: Secondary | ICD-10-CM | POA: Diagnosis present

## 2019-02-04 DIAGNOSIS — Z8249 Family history of ischemic heart disease and other diseases of the circulatory system: Secondary | ICD-10-CM

## 2019-02-04 DIAGNOSIS — Z9841 Cataract extraction status, right eye: Secondary | ICD-10-CM

## 2019-02-04 DIAGNOSIS — I440 Atrioventricular block, first degree: Secondary | ICD-10-CM | POA: Diagnosis not present

## 2019-02-04 DIAGNOSIS — M810 Age-related osteoporosis without current pathological fracture: Secondary | ICD-10-CM | POA: Diagnosis present

## 2019-02-04 DIAGNOSIS — Z79899 Other long term (current) drug therapy: Secondary | ICD-10-CM

## 2019-02-04 DIAGNOSIS — Z902 Acquired absence of lung [part of]: Secondary | ICD-10-CM

## 2019-02-04 DIAGNOSIS — K219 Gastro-esophageal reflux disease without esophagitis: Secondary | ICD-10-CM | POA: Diagnosis present

## 2019-02-04 DIAGNOSIS — E669 Obesity, unspecified: Secondary | ICD-10-CM | POA: Diagnosis present

## 2019-02-04 DIAGNOSIS — Z803 Family history of malignant neoplasm of breast: Secondary | ICD-10-CM

## 2019-02-04 DIAGNOSIS — Z9842 Cataract extraction status, left eye: Secondary | ICD-10-CM

## 2019-02-04 DIAGNOSIS — F17210 Nicotine dependence, cigarettes, uncomplicated: Secondary | ICD-10-CM | POA: Diagnosis present

## 2019-02-04 DIAGNOSIS — J9811 Atelectasis: Secondary | ICD-10-CM | POA: Diagnosis present

## 2019-02-04 DIAGNOSIS — Z8349 Family history of other endocrine, nutritional and metabolic diseases: Secondary | ICD-10-CM

## 2019-02-04 DIAGNOSIS — Z9049 Acquired absence of other specified parts of digestive tract: Secondary | ICD-10-CM

## 2019-02-04 DIAGNOSIS — Z09 Encounter for follow-up examination after completed treatment for conditions other than malignant neoplasm: Secondary | ICD-10-CM

## 2019-02-04 DIAGNOSIS — R7981 Abnormal blood-gas level: Secondary | ICD-10-CM

## 2019-02-04 DIAGNOSIS — J9611 Chronic respiratory failure with hypoxia: Secondary | ICD-10-CM | POA: Diagnosis present

## 2019-02-04 DIAGNOSIS — M1990 Unspecified osteoarthritis, unspecified site: Secondary | ICD-10-CM | POA: Diagnosis present

## 2019-02-04 DIAGNOSIS — Z7982 Long term (current) use of aspirin: Secondary | ICD-10-CM

## 2019-02-04 HISTORY — PX: VIDEO ASSISTED THORACOSCOPY (VATS)/ LOBECTOMY: SHX6169

## 2019-02-04 LAB — GLUCOSE, CAPILLARY
Glucose-Capillary: 153 mg/dL — ABNORMAL HIGH (ref 70–99)
Glucose-Capillary: 198 mg/dL — ABNORMAL HIGH (ref 70–99)

## 2019-02-04 SURGERY — VIDEO ASSISTED THORACOSCOPY (VATS)/ LOBECTOMY
Anesthesia: General | Site: Chest | Laterality: Right

## 2019-02-04 MED ORDER — ROPINIROLE HCL 1 MG PO TABS
0.5000 mg | ORAL_TABLET | Freq: Every day | ORAL | Status: DC
  Administered 2019-02-04 – 2019-02-17 (×14): 0.5 mg via ORAL
  Filled 2019-02-04 (×15): qty 1

## 2019-02-04 MED ORDER — CEFAZOLIN SODIUM-DEXTROSE 2-4 GM/100ML-% IV SOLN
2.0000 g | INTRAVENOUS | Status: AC
  Administered 2019-02-04: 2 g via INTRAVENOUS
  Filled 2019-02-04: qty 100

## 2019-02-04 MED ORDER — FENTANYL CITRATE (PF) 100 MCG/2ML IJ SOLN
INTRAMUSCULAR | Status: DC | PRN
  Administered 2019-02-04 (×2): 50 ug via INTRAVENOUS
  Administered 2019-02-04: 125 ug via INTRAVENOUS
  Administered 2019-02-04: 25 ug via INTRAVENOUS

## 2019-02-04 MED ORDER — ALBUTEROL SULFATE (2.5 MG/3ML) 0.083% IN NEBU
2.5000 mg | INHALATION_SOLUTION | RESPIRATORY_TRACT | Status: DC
  Administered 2019-02-04: 2.5 mg via RESPIRATORY_TRACT
  Filled 2019-02-04: qty 3

## 2019-02-04 MED ORDER — PROPOFOL 10 MG/ML IV BOLUS
INTRAVENOUS | Status: AC
  Filled 2019-02-04: qty 20

## 2019-02-04 MED ORDER — SUGAMMADEX SODIUM 200 MG/2ML IV SOLN
INTRAVENOUS | Status: DC | PRN
  Administered 2019-02-04: 200 mg via INTRAVENOUS

## 2019-02-04 MED ORDER — ONDANSETRON HCL 4 MG/2ML IJ SOLN
INTRAMUSCULAR | Status: DC | PRN
  Administered 2019-02-04: 4 mg via INTRAVENOUS

## 2019-02-04 MED ORDER — IPRATROPIUM BROMIDE 0.02 % IN SOLN: 0.5000 mg | RESPIRATORY_TRACT | Status: DC

## 2019-02-04 MED ORDER — FENTANYL CITRATE (PF) 100 MCG/2ML IJ SOLN
INTRAMUSCULAR | Status: AC
  Filled 2019-02-04: qty 2

## 2019-02-04 MED ORDER — SODIUM CHLORIDE (PF) 0.9 % IJ SOLN
INTRAMUSCULAR | Status: DC | PRN
  Administered 2019-02-04: 50 mL via INTRAVENOUS

## 2019-02-04 MED ORDER — ROCURONIUM BROMIDE 50 MG/5ML IV SOSY
PREFILLED_SYRINGE | INTRAVENOUS | Status: AC
  Filled 2019-02-04: qty 10

## 2019-02-04 MED ORDER — ASPIRIN EC 81 MG PO TBEC
81.0000 mg | DELAYED_RELEASE_TABLET | Freq: Every day | ORAL | Status: DC
  Administered 2019-02-04 – 2019-02-18 (×15): 81 mg via ORAL
  Filled 2019-02-04 (×15): qty 1

## 2019-02-04 MED ORDER — FENTANYL CITRATE (PF) 100 MCG/2ML IJ SOLN
25.0000 ug | INTRAMUSCULAR | Status: DC | PRN
  Administered 2019-02-04: 25 ug via INTRAVENOUS

## 2019-02-04 MED ORDER — HYDROMORPHONE 1 MG/ML IV SOLN
INTRAVENOUS | Status: DC
  Administered 2019-02-04: 25 mg via INTRAVENOUS
  Administered 2019-02-04: 1.8 mg via INTRAVENOUS
  Administered 2019-02-05: 3.9 mg via INTRAVENOUS
  Administered 2019-02-05: 25 mg via INTRAVENOUS
  Administered 2019-02-05: 5.4 mg via INTRAVENOUS
  Administered 2019-02-05: 3.9 mg via INTRAVENOUS
  Administered 2019-02-06: 2.1 mg via INTRAVENOUS
  Administered 2019-02-06: 1.8 mg via INTRAVENOUS
  Filled 2019-02-04 (×2): qty 25

## 2019-02-04 MED ORDER — SODIUM CHLORIDE 0.9 % IV SOLN
INTRAVENOUS | Status: DC
  Administered 2019-02-04 – 2019-02-10 (×4): via INTRAVENOUS

## 2019-02-04 MED ORDER — METOPROLOL TARTRATE 5 MG/5ML IV SOLN
2.5000 mg | INTRAVENOUS | Status: DC | PRN
  Administered 2019-02-05: 2.5 mg via INTRAVENOUS
  Filled 2019-02-04: qty 5

## 2019-02-04 MED ORDER — CEFAZOLIN SODIUM-DEXTROSE 2-4 GM/100ML-% IV SOLN
2.0000 g | Freq: Three times a day (TID) | INTRAVENOUS | Status: AC
  Administered 2019-02-04 (×2): 2 g via INTRAVENOUS
  Filled 2019-02-04 (×2): qty 100

## 2019-02-04 MED ORDER — ALPRAZOLAM 0.5 MG PO TABS
0.5000 mg | ORAL_TABLET | Freq: Two times a day (BID) | ORAL | Status: DC | PRN
  Administered 2019-02-04 – 2019-02-17 (×18): 0.5 mg via ORAL
  Filled 2019-02-04 (×20): qty 1

## 2019-02-04 MED ORDER — ALBUTEROL SULFATE (2.5 MG/3ML) 0.083% IN NEBU: 2.5000 mg | INHALATION_SOLUTION | Freq: Three times a day (TID) | RESPIRATORY_TRACT | Status: DC

## 2019-02-04 MED ORDER — PHENYLEPHRINE 40 MCG/ML (10ML) SYRINGE FOR IV PUSH (FOR BLOOD PRESSURE SUPPORT)
PREFILLED_SYRINGE | INTRAVENOUS | Status: DC | PRN
  Administered 2019-02-04 (×10): 40 ug via INTRAVENOUS

## 2019-02-04 MED ORDER — ENOXAPARIN SODIUM 40 MG/0.4ML ~~LOC~~ SOLN
40.0000 mg | Freq: Every day | SUBCUTANEOUS | Status: DC
  Administered 2019-02-05 – 2019-02-18 (×14): 40 mg via SUBCUTANEOUS
  Filled 2019-02-04 (×14): qty 0.4

## 2019-02-04 MED ORDER — IPRATROPIUM BROMIDE 0.02 % IN SOLN: 0.5000 mg | Freq: Three times a day (TID) | RESPIRATORY_TRACT | Status: DC

## 2019-02-04 MED ORDER — PROPOFOL 10 MG/ML IV BOLUS
INTRAVENOUS | Status: DC | PRN
  Administered 2019-02-04: 10 mg via INTRAVENOUS
  Administered 2019-02-04: 150 mg via INTRAVENOUS

## 2019-02-04 MED ORDER — INSULIN ASPART 100 UNIT/ML ~~LOC~~ SOLN
0.0000 [IU] | Freq: Three times a day (TID) | SUBCUTANEOUS | Status: DC
  Administered 2019-02-04: 4 [IU] via SUBCUTANEOUS
  Administered 2019-02-04 – 2019-02-06 (×4): 2 [IU] via SUBCUTANEOUS

## 2019-02-04 MED ORDER — BUPIVACAINE HCL (PF) 0.5 % IJ SOLN
INTRAMUSCULAR | Status: AC
  Filled 2019-02-04: qty 30

## 2019-02-04 MED ORDER — 0.9 % SODIUM CHLORIDE (POUR BTL) OPTIME
TOPICAL | Status: DC | PRN
  Administered 2019-02-04: 2000 mL

## 2019-02-04 MED ORDER — ONDANSETRON HCL 4 MG/2ML IJ SOLN
4.0000 mg | Freq: Four times a day (QID) | INTRAMUSCULAR | Status: DC | PRN
  Administered 2019-02-06 – 2019-02-09 (×2): 4 mg via INTRAVENOUS
  Filled 2019-02-04 (×2): qty 2

## 2019-02-04 MED ORDER — FENTANYL CITRATE (PF) 250 MCG/5ML IJ SOLN
INTRAMUSCULAR | Status: AC
  Filled 2019-02-04: qty 5

## 2019-02-04 MED ORDER — SODIUM CHLORIDE 0.9% FLUSH: 9.0000 mL | INTRAVENOUS | Status: DC | PRN

## 2019-02-04 MED ORDER — ATORVASTATIN CALCIUM 80 MG PO TABS
80.0000 mg | ORAL_TABLET | Freq: Every day | ORAL | Status: DC
  Administered 2019-02-05 – 2019-02-18 (×14): 80 mg via ORAL
  Filled 2019-02-04 (×15): qty 1

## 2019-02-04 MED ORDER — NALOXONE HCL 0.4 MG/ML IJ SOLN
0.4000 mg | INTRAMUSCULAR | Status: DC | PRN
  Filled 2019-02-04 (×2): qty 1

## 2019-02-04 MED ORDER — LACTATED RINGERS IV SOLN
INTRAVENOUS | Status: DC | PRN
  Administered 2019-02-04: 07:00:00 via INTRAVENOUS

## 2019-02-04 MED ORDER — DEXAMETHASONE SODIUM PHOSPHATE 10 MG/ML IJ SOLN
INTRAMUSCULAR | Status: AC
  Filled 2019-02-04: qty 1

## 2019-02-04 MED ORDER — SENNOSIDES-DOCUSATE SODIUM 8.6-50 MG PO TABS
1.0000 | ORAL_TABLET | Freq: Every day | ORAL | Status: DC
  Administered 2019-02-05 – 2019-02-15 (×11): 1 via ORAL
  Filled 2019-02-04 (×13): qty 1

## 2019-02-04 MED ORDER — AMIODARONE IV BOLUS ONLY 150 MG/100ML
150.0000 mg | Freq: Once | INTRAVENOUS | Status: AC
  Administered 2019-02-04: 150 mg via INTRAVENOUS
  Filled 2019-02-04 (×2): qty 100

## 2019-02-04 MED ORDER — LIDOCAINE 2% (20 MG/ML) 5 ML SYRINGE
INTRAMUSCULAR | Status: AC
  Filled 2019-02-04: qty 5

## 2019-02-04 MED ORDER — DILTIAZEM HCL ER COATED BEADS 120 MG PO CP24
120.0000 mg | ORAL_CAPSULE | Freq: Every day | ORAL | Status: DC
  Administered 2019-02-05 – 2019-02-07 (×3): 120 mg via ORAL
  Filled 2019-02-04 (×3): qty 1

## 2019-02-04 MED ORDER — BUPIVACAINE LIPOSOME 1.3 % IJ SUSP
INTRAMUSCULAR | Status: DC | PRN
  Administered 2019-02-04: 20 mL

## 2019-02-04 MED ORDER — DULOXETINE HCL 60 MG PO CPEP
60.0000 mg | ORAL_CAPSULE | Freq: Every day | ORAL | Status: DC
  Administered 2019-02-05 – 2019-02-17 (×13): 60 mg via ORAL
  Filled 2019-02-04 (×13): qty 1

## 2019-02-04 MED ORDER — ROCURONIUM BROMIDE 10 MG/ML (PF) SYRINGE
PREFILLED_SYRINGE | INTRAVENOUS | Status: DC | PRN
  Administered 2019-02-04: 30 mg via INTRAVENOUS
  Administered 2019-02-04: 50 mg via INTRAVENOUS
  Administered 2019-02-04: 25 mg via INTRAVENOUS
  Administered 2019-02-04: 10 mg via INTRAVENOUS
  Administered 2019-02-04: 20 mg via INTRAVENOUS

## 2019-02-04 MED ORDER — LIDOCAINE 2% (20 MG/ML) 5 ML SYRINGE
INTRAMUSCULAR | Status: DC | PRN
  Administered 2019-02-04: 80 mg via INTRAVENOUS

## 2019-02-04 MED ORDER — PANTOPRAZOLE SODIUM 40 MG PO TBEC
40.0000 mg | DELAYED_RELEASE_TABLET | Freq: Every day | ORAL | Status: DC
  Administered 2019-02-05 – 2019-02-18 (×14): 40 mg via ORAL
  Filled 2019-02-04 (×13): qty 1

## 2019-02-04 MED ORDER — BUPIVACAINE LIPOSOME 1.3 % IJ SUSP
20.0000 mL | Freq: Once | INTRAMUSCULAR | Status: DC
  Filled 2019-02-04: qty 20

## 2019-02-04 MED ORDER — LACTATED RINGERS IV SOLN
INTRAVENOUS | Status: DC | PRN
  Administered 2019-02-04: 08:00:00 via INTRAVENOUS

## 2019-02-04 MED ORDER — AMIODARONE HCL 100 MG PO TABS
100.0000 mg | ORAL_TABLET | Freq: Every day | ORAL | Status: DC
  Administered 2019-02-04 – 2019-02-06 (×3): 100 mg via ORAL
  Filled 2019-02-04 (×3): qty 1

## 2019-02-04 MED ORDER — DEXAMETHASONE SODIUM PHOSPHATE 10 MG/ML IJ SOLN
INTRAMUSCULAR | Status: DC | PRN
  Administered 2019-02-04: 10 mg via INTRAVENOUS

## 2019-02-04 MED ORDER — SODIUM CHLORIDE 0.9 % IV SOLN
INTRAVENOUS | Status: DC | PRN
  Administered 2019-02-04: 20 ug/min via INTRAVENOUS

## 2019-02-04 MED ORDER — ACETAMINOPHEN 160 MG/5ML PO SOLN
1000.0000 mg | Freq: Four times a day (QID) | ORAL | Status: AC
  Filled 2019-02-04: qty 40.6

## 2019-02-04 MED ORDER — ONDANSETRON HCL 4 MG/2ML IJ SOLN
4.0000 mg | Freq: Four times a day (QID) | INTRAMUSCULAR | Status: DC | PRN
  Administered 2019-02-06: 4 mg via INTRAVENOUS
  Filled 2019-02-04 (×2): qty 2

## 2019-02-04 MED ORDER — OXYCODONE HCL 5 MG PO TABS
5.0000 mg | ORAL_TABLET | ORAL | Status: DC | PRN
  Administered 2019-02-04 – 2019-02-05 (×3): 10 mg via ORAL
  Administered 2019-02-06 (×2): 5 mg via ORAL
  Administered 2019-02-07: 10 mg via ORAL
  Administered 2019-02-08 – 2019-02-11 (×5): 5 mg via ORAL
  Administered 2019-02-12 – 2019-02-15 (×5): 10 mg via ORAL
  Administered 2019-02-15 – 2019-02-16 (×3): 5 mg via ORAL
  Filled 2019-02-04 (×2): qty 1
  Filled 2019-02-04 (×2): qty 2
  Filled 2019-02-04 (×3): qty 1
  Filled 2019-02-04 (×4): qty 2
  Filled 2019-02-04 (×3): qty 1
  Filled 2019-02-04: qty 2
  Filled 2019-02-04 (×2): qty 1
  Filled 2019-02-04: qty 2
  Filled 2019-02-04: qty 1
  Filled 2019-02-04: qty 2

## 2019-02-04 MED ORDER — ALBUTEROL SULFATE (2.5 MG/3ML) 0.083% IN NEBU: 2.5000 mg | INHALATION_SOLUTION | RESPIRATORY_TRACT | Status: DC

## 2019-02-04 MED ORDER — AMIODARONE HCL 100 MG PO TABS: 100.0000 mg | ORAL_TABLET | Freq: Every day | ORAL | Status: DC

## 2019-02-04 MED ORDER — HEMOSTATIC AGENTS (NO CHARGE) OPTIME
TOPICAL | Status: DC | PRN
  Administered 2019-02-04: 1 via TOPICAL

## 2019-02-04 MED ORDER — ALBUTEROL SULFATE HFA 108 (90 BASE) MCG/ACT IN AERS
INHALATION_SPRAY | RESPIRATORY_TRACT | Status: DC | PRN
  Administered 2019-02-04 (×2): 5 via RESPIRATORY_TRACT

## 2019-02-04 MED ORDER — POTASSIUM CHLORIDE 10 MEQ/50ML IV SOLN: 10.0000 meq | Freq: Every day | INTRAVENOUS | Status: DC | PRN

## 2019-02-04 MED ORDER — MIDAZOLAM HCL 5 MG/5ML IJ SOLN
INTRAMUSCULAR | Status: DC | PRN
  Administered 2019-02-04 (×2): 1 mg via INTRAVENOUS

## 2019-02-04 MED ORDER — DIPHENHYDRAMINE HCL 12.5 MG/5ML PO ELIX
12.5000 mg | ORAL_SOLUTION | Freq: Four times a day (QID) | ORAL | Status: DC | PRN
  Filled 2019-02-04: qty 5

## 2019-02-04 MED ORDER — DOXEPIN HCL 10 MG PO CAPS
20.0000 mg | ORAL_CAPSULE | Freq: Every day | ORAL | Status: DC
  Administered 2019-02-04 – 2019-02-17 (×14): 20 mg via ORAL
  Filled 2019-02-04 (×14): qty 2

## 2019-02-04 MED ORDER — ALBUTEROL SULFATE HFA 108 (90 BASE) MCG/ACT IN AERS
INHALATION_SPRAY | RESPIRATORY_TRACT | Status: AC
  Filled 2019-02-04: qty 6.7

## 2019-02-04 MED ORDER — CYCLOSPORINE 0.05 % OP EMUL
1.0000 [drp] | Freq: Two times a day (BID) | OPHTHALMIC | Status: DC
  Administered 2019-02-05 – 2019-02-18 (×26): 1 [drp] via OPHTHALMIC
  Filled 2019-02-04 (×27): qty 30

## 2019-02-04 MED ORDER — ROCURONIUM BROMIDE 50 MG/5ML IV SOSY
PREFILLED_SYRINGE | INTRAVENOUS | Status: AC
  Filled 2019-02-04: qty 5

## 2019-02-04 MED ORDER — BUPIVACAINE HCL (PF) 0.5 % IJ SOLN
INTRAMUSCULAR | Status: DC | PRN
  Administered 2019-02-04: 30 mL

## 2019-02-04 MED ORDER — LINACLOTIDE 145 MCG PO CAPS
290.0000 ug | ORAL_CAPSULE | Freq: Every day | ORAL | Status: DC | PRN
  Administered 2019-02-12: 290 ug via ORAL
  Filled 2019-02-04 (×3): qty 2

## 2019-02-04 MED ORDER — PHENYLEPHRINE 40 MCG/ML (10ML) SYRINGE FOR IV PUSH (FOR BLOOD PRESSURE SUPPORT)
PREFILLED_SYRINGE | INTRAVENOUS | Status: AC
  Filled 2019-02-04: qty 10

## 2019-02-04 MED ORDER — IPRATROPIUM BROMIDE 0.02 % IN SOLN
0.5000 mg | RESPIRATORY_TRACT | Status: DC
  Administered 2019-02-04: 0.5 mg via RESPIRATORY_TRACT
  Filled 2019-02-04: qty 2.5

## 2019-02-04 MED ORDER — ONDANSETRON HCL 4 MG/2ML IJ SOLN
INTRAMUSCULAR | Status: AC
  Filled 2019-02-04: qty 2

## 2019-02-04 MED ORDER — ACETAMINOPHEN 500 MG PO TABS
1000.0000 mg | ORAL_TABLET | Freq: Four times a day (QID) | ORAL | Status: AC
  Administered 2019-02-04 – 2019-02-09 (×17): 1000 mg via ORAL
  Filled 2019-02-04 (×19): qty 2

## 2019-02-04 MED ORDER — BISACODYL 5 MG PO TBEC
10.0000 mg | DELAYED_RELEASE_TABLET | Freq: Every day | ORAL | Status: DC
  Administered 2019-02-06 – 2019-02-18 (×11): 10 mg via ORAL
  Filled 2019-02-04 (×13): qty 2

## 2019-02-04 MED ORDER — DIPHENHYDRAMINE HCL 50 MG/ML IJ SOLN
12.5000 mg | Freq: Four times a day (QID) | INTRAMUSCULAR | Status: DC | PRN
  Administered 2019-02-05: 12.5 mg via INTRAVENOUS
  Filled 2019-02-04: qty 0.25
  Filled 2019-02-04: qty 1

## 2019-02-04 MED ORDER — MIDAZOLAM HCL 2 MG/2ML IJ SOLN
INTRAMUSCULAR | Status: AC
  Filled 2019-02-04: qty 2

## 2019-02-04 SURGICAL SUPPLY — 97 items
APPLICATOR COTTON TIP 6 STRL (MISCELLANEOUS) ×1 IMPLANT
APPLICATOR COTTON TIP 6IN STRL (MISCELLANEOUS) ×2
APPLIER CLIP 5 13 M/L LIGAMAX5 (MISCELLANEOUS) ×2
APPLIER CLIP 9.375 SM OPEN (CLIP)
APPLIER CLIP ROT 10 11.4 M/L (STAPLE)
CANISTER SUCT 3000ML PPV (MISCELLANEOUS) ×2 IMPLANT
CATH THORACIC 28FR (CATHETERS) ×2 IMPLANT
CATH THORACIC 28FR RT ANG (CATHETERS) IMPLANT
CATH THORACIC 36FR (CATHETERS) IMPLANT
CATH THORACIC 36FR RT ANG (CATHETERS) IMPLANT
CLIP APPLIE 5 13 M/L LIGAMAX5 (MISCELLANEOUS) ×1 IMPLANT
CLIP APPLIE 9.375 SM OPEN (CLIP) IMPLANT
CLIP APPLIE ROT 10 11.4 M/L (STAPLE) IMPLANT
CLIP VESOCCLUDE MED 6/CT (CLIP) IMPLANT
CLIP VESOCCLUDE SM WIDE 24/CT (CLIP) ×2 IMPLANT
CONN ST 1/4X3/8  BEN (MISCELLANEOUS)
CONN ST 1/4X3/8 BEN (MISCELLANEOUS) IMPLANT
CONN Y 3/8X3/8X3/8  BEN (MISCELLANEOUS)
CONN Y 3/8X3/8X3/8 BEN (MISCELLANEOUS) IMPLANT
CONT SPEC 4OZ CLIKSEAL STRL BL (MISCELLANEOUS) ×30 IMPLANT
COVER SURGICAL LIGHT HANDLE (MISCELLANEOUS) IMPLANT
COVER WAND RF STERILE (DRAPES) ×2 IMPLANT
DERMABOND ADVANCED (GAUZE/BANDAGES/DRESSINGS) ×1
DERMABOND ADVANCED .7 DNX12 (GAUZE/BANDAGES/DRESSINGS) ×1 IMPLANT
DRAIN CHANNEL 28F RND 3/8 FF (WOUND CARE) IMPLANT
DRAIN CHANNEL 32F RND 10.7 FF (WOUND CARE) IMPLANT
DRAPE CV SPLIT W-CLR ANES SCRN (DRAPES) ×2 IMPLANT
DRAPE ORTHO SPLIT 77X108 STRL (DRAPES) ×1
DRAPE SURG ORHT 6 SPLT 77X108 (DRAPES) ×1 IMPLANT
DRAPE WARM FLUID 44X44 (DRAPE) IMPLANT
ELECT BLADE 6.5 EXT (BLADE) ×2 IMPLANT
ELECT REM PT RETURN 9FT ADLT (ELECTROSURGICAL) ×2
ELECTRODE REM PT RTRN 9FT ADLT (ELECTROSURGICAL) ×1 IMPLANT
GAUZE SPONGE 4X4 12PLY STRL (GAUZE/BANDAGES/DRESSINGS) ×2 IMPLANT
GAUZE SPONGE 4X4 12PLY STRL LF (GAUZE/BANDAGES/DRESSINGS) ×2 IMPLANT
GLOVE BIOGEL PI IND STRL 6 (GLOVE) ×2 IMPLANT
GLOVE BIOGEL PI INDICATOR 6 (GLOVE) ×2
GLOVE SURG SIGNA 7.5 PF LTX (GLOVE) ×4 IMPLANT
GOWN STRL REUS W/ TWL LRG LVL3 (GOWN DISPOSABLE) ×2 IMPLANT
GOWN STRL REUS W/ TWL XL LVL3 (GOWN DISPOSABLE) ×1 IMPLANT
GOWN STRL REUS W/TWL LRG LVL3 (GOWN DISPOSABLE) ×2
GOWN STRL REUS W/TWL XL LVL3 (GOWN DISPOSABLE) ×1
HEMOSTAT SURGICEL 2X14 (HEMOSTASIS) ×2 IMPLANT
KIT BASIN OR (CUSTOM PROCEDURE TRAY) ×2 IMPLANT
KIT SUCTION CATH 14FR (SUCTIONS) IMPLANT
KIT TURNOVER KIT B (KITS) ×2 IMPLANT
NEEDLE HYPO 25GX1X1/2 BEV (NEEDLE) ×2 IMPLANT
NEEDLE SPNL 18GX3.5 QUINCKE PK (NEEDLE) ×2 IMPLANT
NS IRRIG 1000ML POUR BTL (IV SOLUTION) ×6 IMPLANT
PACK CHEST (CUSTOM PROCEDURE TRAY) ×2 IMPLANT
PAD ARMBOARD 7.5X6 YLW CONV (MISCELLANEOUS) ×4 IMPLANT
POUCH ENDO CATCH II 15MM (MISCELLANEOUS) ×2 IMPLANT
POUCH SPECIMEN RETRIEVAL 10MM (ENDOMECHANICALS) IMPLANT
RELOAD STAPLER GOLD 60MM (STAPLE) ×15 IMPLANT
RELOAD STAPLER GREEN 60MM (STAPLE) ×5 IMPLANT
SCISSORS ENDO CVD 5DCS (MISCELLANEOUS) IMPLANT
SEALANT PROGEL (MISCELLANEOUS) ×2 IMPLANT
SEALANT SURG COSEAL 4ML (VASCULAR PRODUCTS) IMPLANT
SEALANT SURG COSEAL 8ML (VASCULAR PRODUCTS) IMPLANT
SHEARS HARMONIC HDI 20CM (ELECTROSURGICAL) ×2 IMPLANT
SOLUTION ANTI FOG 6CC (MISCELLANEOUS) ×2 IMPLANT
SPECIMEN JAR MEDIUM (MISCELLANEOUS) IMPLANT
SPONGE INTESTINAL PEANUT (DISPOSABLE) ×6 IMPLANT
SPONGE TONSIL TAPE 1 RFD (DISPOSABLE) ×2 IMPLANT
STAPLE ECHEON FLEX 60 POW ENDO (STAPLE) ×2 IMPLANT
STAPLE RELOAD 2.5MM WHITE (STAPLE) ×12 IMPLANT
STAPLER RELOAD GOLD 60MM (STAPLE) ×30
STAPLER RELOAD GREEN 60MM (STAPLE) ×10
STAPLER VASCULAR ECHELON 35 (CUTTER) ×2 IMPLANT
SUT PROLENE 4 0 RB 1 (SUTURE)
SUT PROLENE 4-0 RB1 .5 CRCL 36 (SUTURE) IMPLANT
SUT SILK  1 MH (SUTURE) ×2
SUT SILK 1 MH (SUTURE) ×2 IMPLANT
SUT SILK 1 TIES 10X30 (SUTURE) IMPLANT
SUT SILK 2 0 SH (SUTURE) IMPLANT
SUT SILK 2 0SH CR/8 30 (SUTURE) ×2 IMPLANT
SUT SILK 3 0 SH 30 (SUTURE) IMPLANT
SUT SILK 3 0SH CR/8 30 (SUTURE) IMPLANT
SUT VIC AB 1 CTX 36 (SUTURE) ×1
SUT VIC AB 1 CTX36XBRD ANBCTR (SUTURE) ×1 IMPLANT
SUT VIC AB 2-0 CTX 36 (SUTURE) ×2 IMPLANT
SUT VIC AB 3-0 MH 27 (SUTURE) IMPLANT
SUT VIC AB 3-0 X1 27 (SUTURE) ×4 IMPLANT
SUT VICRYL 2 TP 1 (SUTURE) IMPLANT
SYR 10ML LL (SYRINGE) IMPLANT
SYR 30ML LL (SYRINGE) ×2 IMPLANT
SYR 50ML LL SCALE MARK (SYRINGE) ×2 IMPLANT
SYSTEM SAHARA CHEST DRAIN ATS (WOUND CARE) ×2 IMPLANT
TAPE CLOTH 4X10 WHT NS (GAUZE/BANDAGES/DRESSINGS) ×2 IMPLANT
TAPE CLOTH SURG 4X10 WHT LF (GAUZE/BANDAGES/DRESSINGS) ×2 IMPLANT
TIP APPLICATOR SPRAY EXTEND 16 (VASCULAR PRODUCTS) ×2 IMPLANT
TOWEL GREEN STERILE (TOWEL DISPOSABLE) ×2 IMPLANT
TOWEL GREEN STERILE FF (TOWEL DISPOSABLE) ×2 IMPLANT
TRAY FOLEY MTR SLVR 16FR STAT (SET/KITS/TRAYS/PACK) ×2 IMPLANT
TROCAR XCEL BLADELESS 5X75MML (TROCAR) ×2 IMPLANT
TROCAR XCEL NON-BLD 5MMX100MML (ENDOMECHANICALS) ×2 IMPLANT
WATER STERILE IRR 1000ML POUR (IV SOLUTION) ×2 IMPLANT

## 2019-02-04 NOTE — Anesthesia Procedure Notes (Signed)
Central Venous Catheter Insertion Performed by: Roberts Gaudy, MD, anesthesiologist Start/End3/04/2019 6:55 AM, 02/04/2019 7:05 AM Patient location: Pre-op. Preanesthetic checklist: patient identified, IV checked, site marked, risks and benefits discussed, surgical consent, monitors and equipment checked, pre-op evaluation, timeout performed and anesthesia consent Lidocaine 1% used for infiltration and patient sedated Hand hygiene performed  and maximum sterile barriers used  Catheter size: 8 Fr Total catheter length 16. Central line was placed.Double lumen Procedure performed using ultrasound guided technique. Ultrasound Notes:image(s) printed for medical record Attempts: 1 Following insertion, dressing applied and line sutured. Post procedure assessment: blood return through all ports  Patient tolerated the procedure well with no immediate complications.

## 2019-02-04 NOTE — Progress Notes (Signed)
Dr [name] here to see pt-updated. New order for bolus dose IV Amiodarone.

## 2019-02-04 NOTE — Anesthesia Procedure Notes (Signed)
Procedure Name: Intubation Date/Time: 02/04/2019 8:31 AM Performed by: Verdie Drown, CRNA Pre-anesthesia Checklist: Patient identified, Emergency Drugs available, Suction available and Patient being monitored Patient Re-evaluated:Patient Re-evaluated prior to induction Oxygen Delivery Method: Circle System Utilized Preoxygenation: Pre-oxygenation with 100% oxygen Induction Type: IV induction Ventilation: Mask ventilation without difficulty and Oral airway inserted - appropriate to patient size Laryngoscope Size: Mac and 3 Grade View: Grade I Tube type: Oral Endobronchial tube: Left, Double lumen EBT, EBT position confirmed by auscultation and EBT position confirmed by fiberoptic bronchoscope and 37 Fr Number of attempts: 1 Airway Equipment and Method: Stylet and Oral airway Placement Confirmation: ETT inserted through vocal cords under direct vision,  positive ETCO2 and breath sounds checked- equal and bilateral Secured at: 29 cm Tube secured with: Tape Dental Injury: Teeth and Oropharynx as per pre-operative assessment

## 2019-02-04 NOTE — Anesthesia Procedure Notes (Signed)
Arterial Line Insertion Start/End3/04/2019 7:10 AM, 02/04/2019 7:15 AM Performed by: Verdie Drown, CRNA, CRNA  Patient location: Pre-op. Preanesthetic checklist: patient identified, IV checked, site marked, risks and benefits discussed, surgical consent, monitors and equipment checked, pre-op evaluation, timeout performed and anesthesia consent Lidocaine 1% used for infiltration and patient sedated Left, radial was placed Catheter size: 20 G Hand hygiene performed , maximum sterile barriers used  and Seldinger technique used Allen's test indicative of satisfactory collateral circulation Attempts: 2 Procedure performed without using ultrasound guided technique. Following insertion, dressing applied. Patient tolerated the procedure well with no immediate complications.

## 2019-02-04 NOTE — Anesthesia Postprocedure Evaluation (Signed)
Anesthesia Post Note  Patient: Tonya Bradley  Procedure(s) Performed: VIDEO ASSISTED THORACOSCOPY (VATS)/RIGHT UPPER AND MIDDLE LOBECTOMY (Right Chest)     Patient location during evaluation: PACU Anesthesia Type: General Level of consciousness: awake Pain management: pain level controlled Vital Signs Assessment: post-procedure vital signs reviewed and stable Cardiovascular status: stable Postop Assessment: no apparent nausea or vomiting Anesthetic complications: no    Last Vitals:  Vitals:   02/04/19 1411 02/04/19 1412  BP:    Pulse: 84 84  Resp: (!) 25 20  Temp:    SpO2: (!) 85% 96%    Last Pain:  Vitals:   02/04/19 1403  TempSrc:   PainSc: Asleep                 Loveda Colaizzi

## 2019-02-04 NOTE — Progress Notes (Signed)
Pt in a fib-rate 115-128, BP stable, Dr Nyoka Cowden updated, wants me to call Dr [name].

## 2019-02-04 NOTE — Interval H&P Note (Signed)
History and Physical Interval Note:  PFT significantly better than last year More than adequate to tolerate resection 02/04/2019 8:11 AM  Tonya Bradley  has presented today for surgery, with the diagnosis of RUL NODULE  The various methods of treatment have been discussed with the patient and family. After consideration of risks, benefits and other options for treatment, the patient has consented to  Procedure(s): VIDEO ASSISTED THORACOSCOPY (VATS)/RIGHT UPPER LOBECTOMY (Right) as a surgical intervention .  The patient's history has been reviewed, patient examined, no change in status, stable for surgery.  I have reviewed the patient's chart and labs.  Questions were answered to the patient's satisfaction.     Melrose Nakayama

## 2019-02-04 NOTE — Brief Op Note (Addendum)
02/04/2019  12:39 PM  PATIENT:  Tonya Bradley  68 y.o. 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  PROCEDURE:  Procedure(s): VIDEO ASSISTED THORACOSCOPY (VATS)/RIGHT UPPER AND MIDDLE LOBECTOMY (Right), LYMPH NODE DISSECTION, INTERCOSTAL NERVE BLOCK  SURGEON:  Surgeon(s) and Role:    * Melrose Nakayama, MD - Primary  PHYSICIAN ASSISTANT: Enid Cutter, PA-C  ANESTHESIA:   general  EBL:  500 mL   BLOOD ADMINISTERED:none  DRAINS: Right 64fr chest tube x 1   LOCAL MEDICATIONS USED:  BUPIVICAINE   SPECIMEN:  Source of Specimen:  Right upper and middle lobes, multiple mediastinal lymph nodes  DISPOSITION OF SPECIMEN:  PATHOLOGY  COUNTS:  YES  DICTATION: .-   PLAN OF CARE: Admit to inpatient   PATIENT DISPOSITION:  PACU - hemodynamically stable.   Delay start of Pharmacological VTE agent (>24hrs) due to surgical blood loss or risk of bleeding: yes

## 2019-02-04 NOTE — Transfer of Care (Signed)
Immediate Anesthesia Transfer of Care Note  Patient: Tonya Bradley  Procedure(s) Performed: VIDEO ASSISTED THORACOSCOPY (VATS)/RIGHT UPPER AND MIDDLE LOBECTOMY (Right Chest)  Patient Location: PACU  Anesthesia Type:General  Level of Consciousness: awake, patient cooperative and responds to stimulation  Airway & Oxygen Therapy: Patient Spontanous Breathing and Patient connected to face mask oxygen  Post-op Assessment: Report given to RN and Post -op Vital signs reviewed and stable  Post vital signs: Reviewed and stable  Last Vitals:  Vitals Value Taken Time  BP 132/54 02/04/2019 12:50 PM  Temp    Pulse 86 02/04/2019 12:55 PM  Resp 32 02/04/2019 12:55 PM  SpO2 94 % 02/04/2019 12:55 PM  Vitals shown include unvalidated device data.  Last Pain:  Vitals:   02/04/19 0656  TempSrc:   PainSc: 0-No pain      Patients Stated Pain Goal: 2 (36/68/15 9470)  Complications: No apparent anesthesia complications

## 2019-02-04 NOTE — Op Note (Signed)
NAMEMADELINE, PHO [facility] MEDICAL RECORD [id number] ACCOUNT [id number] DATE OF BIRTH:May 15, 1951 FACILITY: [facility] LOCATION: [facility] PHYSICIAN:Nicholson Starace Chaya Jan, MD  OPERATIVE REPORT  DATE OF PROCEDURE:  02/04/2019  PREOPERATIVE DIAGNOSIS:  Right upper lobe nodule.  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  PROCEDURE: 1.  Right video-assisted thoracoscopy. 2.  Wedge resection right upper lobe nodule. 3.  Right upper and middle lobectomy. 4.  Lymph node dissection. 5.  Intercostal nerve block levels 3 through 9.  SURGEON:  Modesto Charon, MD  ASSISTANT:  Enid Cutter, PA-C  ANESTHESIA:  General.  FINDINGS:  Incomplete minor fissure.  Right upper lobe nodule in close proximity to the fissure.  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  CLINICAL NOTE:  Mrs. Bouchie is a 68 year old woman with a history of tobacco abuse, COPD, atrial flutter and fibrillation, coronary artery disease, anxiety, depression and arthritis.  She started a low-dose screening CT program in May 2018.  Recently she  had a CT which showed a bilobed right upper lobe pulmonary nodule.  It was unclear if this was a bilobed nodule or 2 separate nodules in close proximity.  She was referred for consideration for surgical resection.  The patient was advised to undergo  right VATS for wedge resection and possible lobectomy, depending on the findings at the time of surgery.  The indications, risks, benefits, and alternatives were discussed in detail with the patient.  She understood and accepted the risks and agreed to  proceed.  OPERATIVE NOTE:  Mrs. Pounds was brought to the preoperative holding area on 02/04/2019.  Anesthesia placed a central venous line and an arterial blood pressure monitoring line.  She was taken to the operating room, anesthetized, and intubated with a  double-lumen endotracheal tube.  A Foley catheter was placed.   Sequential compression devices were placed on the calves for DVT prophylaxis.  Intravenous antibiotics were administered.  She was placed in a left lateral decubitus position, and the right  chest was prepped and draped in the usual sterile fashion.  Single-lung ventilation of the left lung was initiated and was tolerated well throughout the procedure.  A timeout was performed.  A solution containing 20 mL of liposomal bupivacaine, 30 mL of 0.5% bupivacaine, and 50 mL of saline was prepared and used for local at the incision sites and the intercostal nerve blocks.  The incision sites were injected  before making the incision.  An incision was made in the 7th interspace in the midaxillary line.  A 5 mm port was inserted.  The thoracoscope was advanced into the chest.  Initially, there was no ventilation in the right lung, but the lung would not deflate.  A 5 cm working incision  was made in the 4th interspace anterolaterally.  After suctioning and clearing mucus, the lung did begin to deflate, although it took an extremely long period of time.  The intercostal nerve blocks were performed by inserting a needle from a posterior  approach and injecting 10 mL of the bupivacaine solution into a subpleural plane at each interspace from the 3rd through the 9th.  After a significant delay waiting for the lung to adequately deflate, the nodule was palpated in the right upper lobe in  close proximity to the junction of the upper and middle lobes laterally.  There was only a tiny indentation to mark the minor fissure in this area.  The remainder of the minor fissure was incomplete.  The major fissure was also incomplete,  but much more  distinct than the minor fissure.  A wedge resection was performed with sequential firings of an Echelon 60 mm stapler using gold cartridges.  The specimen was removed and sent for frozen section.  The frozen section returned showing a malignancy.  It was  felt this could possibly  represent small-cell carcinoma.  Lymphoma was also in the differential.  Given the possibility that this was a potentially curable small cell, decision was made to proceed with lobectomy.  The inferior pulmonary ligament was  divided with the Harmonic scalpel.  The pleural reflection was divided posteriorly.  Level 9, 8, and 7 nodes were removed, as were level 10 and 11 nodes.  All lymph nodes that were removed during the dissection were sent as separate specimens for  permanent pathology.  An attempt was made to begin to create a minor fissure with firings of the Echelon stapler along the plane defined by the upper and middle lobe branches of the superior pulmonary vein.  This was very indistinct.  The middle lobe was  relatively small.  The superior pulmonary vein branches to the upper lobe were dissected out.  While dissecting out the superior pulmonary vein branches, significant bleeding occurred.  A tonsil ball was used to hold pressure on the area with control of  the bleeding.  A second port incision was made anterior to the first and used as a site for retraction.  The bleeding was identified and was found to be coming from the middle lobe branch of the superior pulmonary vein near the bifurcation.  Given the  relatively small size of the middle lobe, the decision was made to resect it in addition to the upper lobe.  The middle lobe vein branches were dissected out, encircled, and divided with the vascular stapler.  The upper lobe branches then were divided  with the vascular stapler.  Dissection was carried anteriorly, and additional level 10 nodes were removed.  The anterior and apical branches arose as a common trunk from the main pulmonary artery.  These were dissected out, encircled and divided with the  vascular stapler.  The posterior ascending branch arose from the pulmonary artery in close proximity to the upper lobe bronchus.  The decision was made to remove the middle lobe before attempting  to divide this vessel.  The major fissure then was  divided with sequential firings of an Echelon stapler using gold cartridges.  The portion between the middle lobe and the lower lobe was divided up to the lower lobe pulmonary artery.  Once this was identified, the dissection was carried along the  superior segmental branch of the lower lobe, and multiple firings of the Echelon stapler were used to complete the fissure laterally.  The middle lobe bronchus was then encircled and divided with the Echelon stapler using a green cartridge.  Initially,  the stapler was placed across the bronchus and closed.  A test inflation showed good aeration of the lower lobe.  The stapler then was fired, transecting the bronchus.  The middle lobe pulmonary artery branch then was easily identified and divided with  the vascular stapler, followed by the posterior ascending pulmonary artery branch to the upper lobe, which was also divided with the stapler.  Additional nodes were removed from around the right upper lobe bronchus.  The Echelon stapler with a green cartridge  was placed across the right upper lobe bronchus at its origin and closed.  A test inflation showed good aeration of the lower  lobe.  The stapler was fired, transecting the bronchus.  The upper and middle lobes were placed into an endoscopic retrieval  bag, removed and sent for permanent pathology.  There was good hemostasis at all staple lines.  The azygos vein was elevated, and level 4R nodes were removed and sent for permanent pathology.  The chest was copiously irrigated with warm saline.  A test  inflation to 30 cm of water revealed no leakage from either bronchial stump.  There was minimal leakage around the staple line posteriorly of the fissure.  Progel was applied to this area.  A 28-French chest tube was placed through the anterior port  incision and secured to the skin with a #1 silk suture.  Dual-lung ventilation was resumed.  The scope was  removed.  The remaining port incision was closed with a 3-0 Vicryl subcuticular suture.  The working incision was closed in standard fashion.   Dermabond was applied to the incisions.  The chest tube was placed to suction.  The patient was placed back in a supine position.  She then was extubated in the operating room and taken to the [facility] in good condition.  LN/NUANCE  D:02/04/2019 T:02/04/2019 JOB:[id number]

## 2019-02-05 ENCOUNTER — Encounter (HOSPITAL_COMMUNITY): Payer: Self-pay | Admitting: Thoracic Surgery (Cardiothoracic Vascular Surgery)

## 2019-02-05 ENCOUNTER — Inpatient Hospital Stay (HOSPITAL_COMMUNITY): Payer: Medicare HMO

## 2019-02-05 LAB — BLOOD GAS, ARTERIAL
Acid-Base Excess: 3.1 mmol/L — ABNORMAL HIGH (ref 0.0–2.0)
Bicarbonate: 28.7 mmol/L — ABNORMAL HIGH (ref 20.0–28.0)
Drawn by: 511551
O2 Content: 4 L/min
O2 Saturation: 89.8 %
PCO2 ART: 57.2 mmHg — AB (ref 32.0–48.0)
Patient temperature: 98.6
pH, Arterial: 7.321 — ABNORMAL LOW (ref 7.350–7.450)
pO2, Arterial: 62.2 mmHg — ABNORMAL LOW (ref 83.0–108.0)

## 2019-02-05 LAB — CBC
HCT: 32 % — ABNORMAL LOW (ref 36.0–46.0)
Hemoglobin: 10.2 g/dL — ABNORMAL LOW (ref 12.0–15.0)
MCH: 29.5 pg (ref 26.0–34.0)
MCHC: 31.9 g/dL (ref 30.0–36.0)
MCV: 92.5 fL (ref 80.0–100.0)
Platelets: 269 10*3/uL (ref 150–400)
RBC: 3.46 MIL/uL — ABNORMAL LOW (ref 3.87–5.11)
RDW: 12.9 % (ref 11.5–15.5)
WBC: 14.2 10*3/uL — ABNORMAL HIGH (ref 4.0–10.5)
nRBC: 0 % (ref 0.0–0.2)

## 2019-02-05 LAB — BASIC METABOLIC PANEL
Anion gap: 7 (ref 5–15)
BUN: 14 mg/dL (ref 8–23)
CHLORIDE: 101 mmol/L (ref 98–111)
CO2: 28 mmol/L (ref 22–32)
Calcium: 9 mg/dL (ref 8.9–10.3)
Creatinine, Ser: 0.48 mg/dL (ref 0.44–1.00)
GFR calc non Af Amer: >60 mL/min (ref >60)
Glucose, Bld: 130 mg/dL — ABNORMAL HIGH (ref 70–99)
Potassium: 3.9 mmol/L (ref 3.5–5.1)
Sodium: 136 mmol/L (ref 135–145)

## 2019-02-05 LAB — GLUCOSE, CAPILLARY
Glucose-Capillary: 125 mg/dL — ABNORMAL HIGH (ref 70–99)
Glucose-Capillary: 136 mg/dL — ABNORMAL HIGH (ref 70–99)
Glucose-Capillary: 113 mg/dL — ABNORMAL HIGH (ref 70–99)
Glucose-Capillary: 126 mg/dL — ABNORMAL HIGH (ref 70–99)

## 2019-02-05 LAB — MAGNESIUM: Magnesium: 2 mg/dL (ref 1.7–2.4)

## 2019-02-05 LAB — POTASSIUM: Potassium: 3.6 mmol/L (ref 3.5–5.1)

## 2019-02-05 MED ORDER — POTASSIUM CHLORIDE ER 10 MEQ PO TBCR
10.0000 meq | EXTENDED_RELEASE_TABLET | Freq: Every day | ORAL | Status: DC
  Administered 2019-02-05 – 2019-02-12 (×7): 10 meq via ORAL
  Filled 2019-02-05 (×16): qty 1

## 2019-02-05 MED ORDER — AMIODARONE IV BOLUS ONLY 150 MG/100ML
150.0000 mg | Freq: Once | INTRAVENOUS | Status: AC
  Administered 2019-02-05: 150 mg via INTRAVENOUS
  Filled 2019-02-05 (×2): qty 100

## 2019-02-05 MED ORDER — IPRATROPIUM BROMIDE 0.02 % IN SOLN
0.5000 mg | Freq: Four times a day (QID) | RESPIRATORY_TRACT | Status: DC | PRN
  Administered 2019-02-13 (×2): 0.5 mg via RESPIRATORY_TRACT
  Filled 2019-02-05: qty 2.5

## 2019-02-05 MED ORDER — LEVALBUTEROL HCL 0.63 MG/3ML IN NEBU: 0.6300 mg | INHALATION_SOLUTION | Freq: Three times a day (TID) | RESPIRATORY_TRACT | Status: DC

## 2019-02-05 MED ORDER — VARENICLINE TARTRATE 1 MG PO TABS
1.0000 mg | ORAL_TABLET | Freq: Two times a day (BID) | ORAL | Status: DC
  Administered 2019-02-05 (×2): 1 mg via ORAL
  Filled 2019-02-05 (×4): qty 1

## 2019-02-05 MED ORDER — LEVALBUTEROL HCL 0.63 MG/3ML IN NEBU
0.6300 mg | INHALATION_SOLUTION | Freq: Four times a day (QID) | RESPIRATORY_TRACT | Status: DC | PRN
  Administered 2019-02-06: 0.63 mg via RESPIRATORY_TRACT
  Filled 2019-02-05: qty 3

## 2019-02-05 MED ORDER — FUROSEMIDE 40 MG PO TABS
60.0000 mg | ORAL_TABLET | Freq: Every day | ORAL | Status: DC
  Administered 2019-02-05 – 2019-02-18 (×14): 60 mg via ORAL
  Filled 2019-02-05 (×14): qty 1

## 2019-02-05 MED ORDER — KETOROLAC TROMETHAMINE 15 MG/ML IJ SOLN
15.0000 mg | Freq: Four times a day (QID) | INTRAMUSCULAR | Status: AC | PRN
  Administered 2019-02-05 – 2019-02-07 (×5): 15 mg via INTRAVENOUS
  Filled 2019-02-05 (×5): qty 1

## 2019-02-05 NOTE — Plan of Care (Signed)

## 2019-02-05 NOTE — Progress Notes (Signed)
HR went up to 130's -140's with converting to a-fib at 1722, Continue to stay 120's - 140's. Administered metoprolol 2.5mg  IVP as prn order at 1733. Pt's staying a-fib with HR 100's to 120's now. Continue to monitor pt's HR. HS [employer].

## 2019-02-05 NOTE — Progress Notes (Addendum)
1 Day Post-Op Procedure(s) (LRB): VIDEO ASSISTED THORACOSCOPY (VATS)/RIGHT UPPER AND MIDDLE LOBECTOMY (Right) Subjective: Awake and alert.  Using the PCA but continues to have some pain at the surgical site.   Objective: Vital signs in last 24 hours: Temp:  [97.7 F (36.5 C)-98.1 F (36.7 C)] 97.9 F (36.6 C) (03/06 0739) Pulse Rate:  [75-158] 91 (03/06 0739) Cardiac Rhythm: Normal sinus rhythm (03/06 0930) Resp:  [14-25] 22 (03/06 0805) BP: (95-132)/(49-78) 123/78 (03/06 0739) SpO2:  [85 %-99 %] 95 % (03/06 0805) Arterial Line BP: (91-132)/(39-55) 100/50 (03/05 1743) Weight:  [77.7 kg] 77.7 kg (03/05 1552)    Intake/Output from previous day: 03/05 0701 - 03/06 0700 In: 1680 [P.O.:80; I.V.:1150] Out: 6378 [Urine:535; Blood:500; Chest Tube:520] Intake/Output this shift: Total I/O In: 660 [P.O.:360; I.V.:300] Out: 900 [Urine:900]  General appearance: alert, cooperative and mild distress Heart: Regular rate and rhythm  Lungs: clear to auscultation bilaterally, expected moderate drainage from the chest tube.  No air leak is seen. Wound: Dressings are dry  Lab Results: Recent Labs    02/05/19 0500  WBC 14.2*  HGB 10.2*  HCT 32.0*  PLT 269   BMET:  Recent Labs    02/05/19 0500  NA 136  K 3.9  CL 101  CO2 28  GLUCOSE 130*  BUN 14  CREATININE 0.48  CALCIUM 9.0    PT/INR: No results for input(s): LABPROT, INR in the last 72 hours. 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  Respiratory status stable.  Chest x-ray satisfactory with  trace right apical lateral airspace.  No air leak is seen.  Chest tube to waterseal today.  We will add Toradol as needed for pain for 48 hours.  -COPD, on home oxygen therapy-continue nebulizers. Continue to work on pulmonary hygiene and mobilization.  -History of depression and anxiety.  Her psychotropic medications have been resumed.  -History of atrial fibrillation, status post ablation.  Currently in sinus rhythm.  Amiodarone resumed.  -DVT prophylaxis-enoxaparin subcu daily.  Mobilize     LOS: 1 day    Antony Odea, PA-C [phone] 02/05/2019 Patient seen and examined, agree with above No air leak, moderate drainage- dc CT when < 200 ml/ day  [name]. [name], MD [facility] [phone]

## 2019-02-06 ENCOUNTER — Inpatient Hospital Stay (HOSPITAL_COMMUNITY): Payer: Medicare HMO

## 2019-02-06 LAB — GLUCOSE, CAPILLARY
GLUCOSE-CAPILLARY: 105 mg/dL — AB (ref 70–99)
Glucose-Capillary: 134 mg/dL — ABNORMAL HIGH (ref 70–99)
Glucose-Capillary: 134 mg/dL — ABNORMAL HIGH (ref 70–99)
Glucose-Capillary: 140 mg/dL — ABNORMAL HIGH (ref 70–99)
Glucose-Capillary: 174 mg/dL — ABNORMAL HIGH (ref 70–99)

## 2019-02-06 LAB — CBC
HEMATOCRIT: 32.6 % — AB (ref 36.0–46.0)
HEMOGLOBIN: 10.5 g/dL — AB (ref 12.0–15.0)
MCH: 29.9 pg (ref 26.0–34.0)
MCHC: 32.2 g/dL (ref 30.0–36.0)
MCV: 92.9 fL (ref 80.0–100.0)
Platelets: 227 10*3/uL (ref 150–400)
RBC: 3.51 MIL/uL — AB (ref 3.87–5.11)
RDW: 13.1 % (ref 11.5–15.5)
WBC: 13.7 10*3/uL — ABNORMAL HIGH (ref 4.0–10.5)
nRBC: 0 % (ref 0.0–0.2)

## 2019-02-06 LAB — COMPREHENSIVE METABOLIC PANEL
ALK PHOS: 113 U/L (ref 38–126)
ALT: 76 U/L — AB (ref 0–44)
AST: 54 U/L — ABNORMAL HIGH (ref 15–41)
Albumin: 2.7 g/dL — ABNORMAL LOW (ref 3.5–5.0)
Anion gap: 8 (ref 5–15)
BUN: 11 mg/dL (ref 8–23)
CALCIUM: 8.6 mg/dL — AB (ref 8.9–10.3)
CO2: 29 mmol/L (ref 22–32)
Chloride: 100 mmol/L (ref 98–111)
Creatinine, Ser: 0.59 mg/dL (ref 0.44–1.00)
GFR calc Af Amer: >60 mL/min (ref >60)
GFR calc non Af Amer: >60 mL/min (ref >60)
Glucose, Bld: 141 mg/dL — ABNORMAL HIGH (ref 70–99)
Potassium: 3.2 mmol/L — ABNORMAL LOW (ref 3.5–5.1)
Sodium: 137 mmol/L (ref 135–145)
Total Bilirubin: 0.6 mg/dL (ref 0.3–1.2)
Total Protein: 5.4 g/dL — ABNORMAL LOW (ref 6.5–8.1)

## 2019-02-06 MED ORDER — GUAIFENESIN ER 600 MG PO TB12
1200.0000 mg | ORAL_TABLET | Freq: Two times a day (BID) | ORAL | Status: DC
  Administered 2019-02-06 – 2019-02-18 (×25): 1200 mg via ORAL
  Filled 2019-02-06 (×25): qty 2

## 2019-02-06 MED ORDER — POTASSIUM CHLORIDE CRYS ER 20 MEQ PO TBCR
40.0000 meq | EXTENDED_RELEASE_TABLET | Freq: Three times a day (TID) | ORAL | Status: AC
  Administered 2019-02-06 (×3): 40 meq via ORAL
  Filled 2019-02-06 (×3): qty 2

## 2019-02-06 MED ORDER — FUROSEMIDE 10 MG/ML IJ SOLN
40.0000 mg | Freq: Once | INTRAMUSCULAR | Status: AC
  Administered 2019-02-06: 40 mg via INTRAVENOUS
  Filled 2019-02-06: qty 4

## 2019-02-06 MED ORDER — AMIODARONE HCL IN DEXTROSE 360-4.14 MG/200ML-% IV SOLN
30.0000 mg/h | INTRAVENOUS | Status: DC
  Administered 2019-02-07: 30 mg/h via INTRAVENOUS
  Filled 2019-02-06: qty 200

## 2019-02-06 MED ORDER — AMIODARONE HCL IN DEXTROSE 360-4.14 MG/200ML-% IV SOLN
60.0000 mg/h | INTRAVENOUS | Status: AC
  Filled 2019-02-06: qty 200

## 2019-02-06 MED ORDER — SODIUM CHLORIDE 0.9 % IV SOLN
1.0000 g | Freq: Three times a day (TID) | INTRAVENOUS | Status: DC
  Administered 2019-02-06 – 2019-02-14 (×24): 1 g via INTRAVENOUS
  Filled 2019-02-06 (×26): qty 1

## 2019-02-06 NOTE — Progress Notes (Signed)
TCTS BRIEF PROGRESS NOTE  2 Days Post-Op  S/P Procedure(s) (LRB): VIDEO ASSISTED THORACOSCOPY (VATS)/RIGHT UPPER AND MIDDLE LOBECTOMY (Right)   Patient w/ increased work of breathing and O2 requirement, RR 20-25 on 5 L/min Maintaining NSR w/ stable BP Diminished BS's reported on right side and increased serosanguinous drainage from chest tube CXR w/ markedly increased opacity right lung c/w atelectasis  Plan: Nebs, flutter valve, pulm toilet.  Replace tube to suction.  Consider NTS to stimulate cough.  Add BiPAP and transfer ICU if she doesn't improve soon.  Rexene Alberts, MD 02/06/2019 3:39 PM

## 2019-02-06 NOTE — Progress Notes (Signed)
MD ordered bipap however pt very adamantly refusing at this time. Stated "hell no I'm not wearing that". RN aware. RT will continue to monitor

## 2019-02-06 NOTE — Progress Notes (Signed)
Patient ambulated approx 500 ft w/four wheeled walker.  Patient on O2 @ 6L/[place] while ambulating.  Patient returns to bed w/increased work of breathing.  Sats drop to mid 70s.  Patient assisted back into bed; O2 @ 6L/[place].  LS w/expiratory wheezes - new from AM.  Foley recently discontinued w/383ml clear yellow urine.  Sats come up to low 80s but not much higher on 6L/.  Also, new small air leak noted in right lateral chest tube.  Patient states it feels like she is "choking" when she tries to breathe.  Tonya Cutter, PA-C for CV[facility] notified of changes since AM.  New orders received for IV Lasix 40mg .  Will continue to monitor closely.

## 2019-02-06 NOTE — Progress Notes (Signed)
K+ 3.2 this morning. Dr. Roxy Manns notified and order received for potassium replacements. Will continue to monitor closely.

## 2019-02-06 NOTE — Progress Notes (Addendum)
2 Days Post-Op Procedure(s) (LRB): VIDEO ASSISTED THORACOSCOPY (VATS)/RIGHT UPPER AND MIDDLE LOBECTOMY (Right) Subjective: Became restless and combative last night.  She is awake, cooperative and has appropriate mental status this morning.  She is very apologetic about the events of last night.  She is complaining of some nausea.  She has had bowel movement and is not passing gas.  Objective: Vital signs in last 24 hours: Temp:  [97.7 F (36.5 C)-98.5 F (36.9 C)] 97.7 F (36.5 C) (03/07 0400) Pulse Rate:  [94-148] 94 (03/07 0400) Cardiac Rhythm: Normal sinus rhythm (03/07 0700) Resp:  [13-23] 22 (03/07 0400) BP: (110-118)/(60-77) 118/60 (03/07 0400) SpO2:  [93 %-100 %] 100 % (03/07 0400)     Intake/Output from previous day: 03/06 0701 - 03/07 0700 In: 1130 [P.O.:830; I.V.:300] Out: 2970 [Urine:2700; Chest Tube:270] Intake/Output this shift: No intake/output data recorded.  General appearance: alert, cooperative, moderate distress and moderately obese Heart: regular rate and rhythm Lungs: Breath sounds are clear anterior Abdomen: Bowel sounds are active.  She has mild distention.  No tenderness. Extremities: No edema Wound: Thoracotomy incision is dry and well approximated.  There is no air leak.  Chest tube remains on waterseal.  Drainage total 270 mL's for the past 24 hours.  Lab Results: Recent Labs    02/05/19 0500 02/06/19 0408  WBC 14.2* 13.7*  HGB 10.2* 10.5*  HCT 32.0* 32.6*  PLT 269 227   BMET:  Recent Labs    02/05/19 0500 02/05/19 1823 02/06/19 0408  NA 136  --  137  K 3.9 3.6 3.2*  CL 101  --  100  CO2 28  --  29  GLUCOSE 130*  --  141*  BUN 14  --  11  CREATININE 0.48  --  0.59  CALCIUM 9.0  --  8.6*    PT/INR: No results for input(s): LABPROT, INR in the last 72 hours. 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  Respiratory status stable.  Chest x-ray stable with trace right apical lateral airspace.  No air leak is seen.  Chest tube remains to waterseal today.  We will leave it in place until drainage has further subsided.  We discontinue PCA and continue Toradol as needed for pain for 48 hours.  -COPD, on home oxygen therapy-continue nebulizers. Continue to work on pulmonary hygiene and mobilization.  -History of depression and anxiety.  Her psychotropic medications have been resumed.  -History of atrial fibrillation, status post ablation.  After being in sinus rhythm most of the day yesterday, she had an episode of atrial fibrillation with a few rate around 130 last night.  She was given an additional bolus of amiodarone 150 mg IV x1.  She converted back to sinus rhythm Currently in sinus rhythm.  Oral Amiodarone resumed.   -Hypokalemia-supplement  -DVT prophylaxis-enoxaparin subcu daily.  Mobilize   LOS: 2 days    Antony Odea, PA-C [phone] 02/06/2019   I have seen and examined the patient and agree with the assessment and plan as outlined.  Severe agitation  and delirium overnight.  Reportedly improved when patient's family spoke with her via telephone.  Currently no confusion or delirium.  Home psychotropic medications have been resumed.  Patient's family reports that she had been having some hallucinations at night at home PTA and wonder if it might be related to Chantix, which has been stopped.  Rexene Alberts, MD 02/06/2019 10:29 AM

## 2019-02-06 NOTE — Progress Notes (Signed)
Patient woke up from sleeping very confused, aggravated, combative, fearful, delusional, not knowing where she is. She doesn't believe she is in the hospital, she thinks were are going to kill her. She is trying to call 911, for help. Staff members in room to assist for patients safety. Narcan 0.4mg  given IV for patient is taking Dilaudid PCA for surgical pain. Chest tube in right side, Foley cath intact, blood sugar 174, and family is not answering their phone. She thinks we are killing her husband. Rapid response to floor to offer assist. Patient climbing OOB with central line in her R IJ. She doesn't know where she is and she can't identify what happened to her. We finally got her son on the phone and he is trying to calm her down. Dr. Roxy Manns notified of this change. Will continue to monitor patient.

## 2019-02-06 NOTE — Progress Notes (Signed)
Patient w/increased work of breathing; decreasing O2 sats.  Patient responded to IV Lasix 40 mg w/673ml output per previous order.  Patient is presently on O2 @ 5L/[place] - states 6L/[place] "blows her nose off".  Enid Cutter, PA-C notified and new order for [facility] received.

## 2019-02-06 NOTE — Progress Notes (Signed)
Dr Roxy Manns notified of Stat PCXR results - RN asked him to review.  New orders received for flutter valve and aggressive pulmonary toileting.  Respiratory notified.

## 2019-02-06 NOTE — Progress Notes (Addendum)
TCTS BRIEF PROGRESS NOTE  2 Days Post-Op  S/P Procedure(s) (LRB): VIDEO ASSISTED THORACOSCOPY (VATS)/RIGHT UPPER AND MIDDLE LOBECTOMY (Right)   Breathing improved.  Denies cough Currently looks comfortable w/ O2 sats 90-92% on 5 L/min via  Breath sounds diminished on right but moving some air, no wheezes Brief episode rapid Afib earlier, none since  Plan: Will try to get sputum culture if patient can produce sputum.  Start empiric ABx.  Continue to work hard on pulmonary toilet.  Monitor closely.  Rexene Alberts, MD 02/06/2019 5:28 PM

## 2019-02-06 NOTE — Progress Notes (Signed)
Patient is doing much better than previously this afternoon.  Sats 95% on O2 @ 5.5L/[place].  Continues to do aggressive pulmonary toileting w/flutter valve and IS, pulls 569ml.  Husband, Tonya Bradley, plans to stay w/patient at bedside overnightl to assist w/confusion, agitation, and forgetfulness.  Patient goes back and forth between NSR 80-90s and Afib RVR 110-120s.  IV AMIO gtt is on hold unless patient sustains Afib RVR.  Will continue to monitor.

## 2019-02-06 NOTE — Progress Notes (Signed)
Patient has had 377ml of serosanguinous drainage from CT over last 4.5 hrs, 290ml of which dumped in the last 30 minutes.  CVTS aware.  CT placed back to -20cm sxn per CVTS order.  Patient also given flutter valve.  Timer set for patient to do aggressive pulmonary toileting every 15 minutes w/flutter valve and IS.  Patient pulls 245ml on IS.  Patient presently sats mid 80s to upper 80s on O2 @ 6L/[place].  Continuing to monitor closely.  Family at bedside.

## 2019-02-07 ENCOUNTER — Inpatient Hospital Stay (HOSPITAL_COMMUNITY): Payer: Medicare HMO

## 2019-02-07 LAB — BASIC METABOLIC PANEL
Anion gap: 9 (ref 5–15)
BUN: 10 mg/dL (ref 8–23)
CO2: 30 mmol/L (ref 22–32)
Calcium: 8.5 mg/dL — ABNORMAL LOW (ref 8.9–10.3)
Chloride: 100 mmol/L (ref 98–111)
Creatinine, Ser: 0.55 mg/dL (ref 0.44–1.00)
GFR calc Af Amer: >60 mL/min (ref >60)
GFR calc non Af Amer: >60 mL/min (ref >60)
GLUCOSE: 127 mg/dL — AB (ref 70–99)
POTASSIUM: 3.8 mmol/L (ref 3.5–5.1)
Sodium: 139 mmol/L (ref 135–145)

## 2019-02-07 LAB — CBC
HCT: 28.2 % — ABNORMAL LOW (ref 36.0–46.0)
Hemoglobin: 9.1 g/dL — ABNORMAL LOW (ref 12.0–15.0)
MCH: 29.9 pg (ref 26.0–34.0)
MCHC: 32.3 g/dL (ref 30.0–36.0)
MCV: 92.8 fL (ref 80.0–100.0)
Platelets: 224 10*3/uL (ref 150–400)
RBC: 3.04 MIL/uL — ABNORMAL LOW (ref 3.87–5.11)
RDW: 13 % (ref 11.5–15.5)
WBC: 10.8 10*3/uL — ABNORMAL HIGH (ref 4.0–10.5)
nRBC: 0 % (ref 0.0–0.2)

## 2019-02-07 LAB — GLUCOSE, CAPILLARY
Glucose-Capillary: 111 mg/dL — ABNORMAL HIGH (ref 70–99)
Glucose-Capillary: 95 mg/dL (ref 70–99)

## 2019-02-07 MED ORDER — METOPROLOL TARTRATE 25 MG PO TABS
25.0000 mg | ORAL_TABLET | Freq: Two times a day (BID) | ORAL | Status: DC
  Administered 2019-02-07 – 2019-02-18 (×23): 25 mg via ORAL
  Filled 2019-02-07 (×22): qty 1

## 2019-02-07 MED ORDER — DILTIAZEM HCL ER COATED BEADS 240 MG PO CP24
240.0000 mg | ORAL_CAPSULE | Freq: Every day | ORAL | Status: DC
  Administered 2019-02-08 – 2019-02-18 (×11): 240 mg via ORAL
  Filled 2019-02-07 (×12): qty 1

## 2019-02-07 MED ORDER — AMIODARONE IV BOLUS ONLY 150 MG/100ML
150.0000 mg | Freq: Once | INTRAVENOUS | Status: AC
  Administered 2019-02-07: 150 mg via INTRAVENOUS
  Filled 2019-02-07: qty 100

## 2019-02-07 NOTE — Progress Notes (Addendum)
Paged provider due to patient being in Afib rate 120's - 130's sustained. Provider advise to restart Amio gtt 30 mg/hr. Amio restarted at 16:55. RN will continue to monitor patient.

## 2019-02-07 NOTE — Progress Notes (Addendum)
3 Days Post-Op Procedure(s) (LRB): VIDEO ASSISTED THORACOSCOPY (VATS)/RIGHT UPPER AND MIDDLE LOBECTOMY (Right) Subjective: Feels much better today. She is not short of breath, appetite has returned. O2 sats 95%, weaning O2.  Objective: Vital signs in last 24 hours: Temp:  [97.8 F (36.6 C)-98.8 F (37.1 C)] 97.9 F (36.6 C) (03/08 0755) Pulse Rate:  [85-115] 115 (03/07 1634) Cardiac Rhythm: Normal sinus rhythm (03/08 0755) Resp:  [20-30] 26 (03/07 2300) BP: (106-132)/(56-69) 132/63 (03/08 1027) SpO2:  [88 %-99 %] 91 % (03/08 0400)     Intake/Output from previous day: 03/07 0701 - 03/08 0700 In: 440 [P.O.:220; I.V.:120; IV Piggyback:100] Out: 3700 [Urine:2750; Chest Tube:950] Intake/Output this shift: Total I/O In: -  Out: 50 [Chest Tube:50]  General appearance: alert, cooperative, moderate distress and moderately obese Heart: regular rate and rhythm Lungs: Breath sounds are clear anterior Abdomen: Bowel sounds are active.  Abdomen is soft.   No tenderness. Extremities: No edema Wound: Thoracotomy incision is dry and well approximated.  There is no air leak.  Chest tube was placed to suction yesterday when she developed some respiratory distress.  She had a large volume of pleural drainage after he was returned to suction.  She has had minimal drainage overnight,  however.  Lab Results: Recent Labs    02/06/19 0408 02/07/19 0432  WBC 13.7* 10.8*  HGB 10.5* 9.1*  HCT 32.6* 28.2*  PLT 227 224   BMET:  Recent Labs    02/06/19 0408 02/07/19 0432  NA 137 139  K 3.2* 3.8  CL 100 100  CO2 29 30  GLUCOSE 141* 127*  BUN 11 10  CREATININE 0.59 0.55  CALCIUM 8.6* 8.5*    PT/INR: No results for input(s): LABPROT, INR in the last 72 hours. ABG    Component Value Date/Time   PHART 7.321 (L) 02/05/2019 0452   HCO3 28.7 (H) 02/05/2019 0452   O2SAT 89.8 02/05/2019 0452   CBG (last 3)  Recent Labs    02/06/19 1633 02/06/19 2130 02/07/19 0623  GLUCAP 134* 134* 111*     Assessment/Plan: S/P Procedure(s) (LRB): VIDEO ASSISTED THORACOSCOPY (VATS)/RIGHT UPPER AND MIDDLE LOBECTOMY (Right)  -Postop day 3 right upper and middle lobectomy for suspected small cell carcinoma based on perioperative frozen section. Respiratory status stable.  She developed respiratory distress yesterday while the tube was on waterseal.  Chest x-ray showed near complete opacity of the right hemithorax.  When the chest tube was placed back on suction, there was prompt evacuation of a large effusion.  Her chest x-ray has improved. We will leave the tube in one more day to monitor drainage but place back to waterseal so she can be more mobile.    -COPD, on home oxygen therapy-continue nebulizers.Continue towork on pulmonary hygiene and mobilization.  -History of depression and anxiety.Her psychotropic medications have been resumed.  -History of atrial fibrillation, status post ablation.    She was in sinus rhythm at time of admission and was taking oral amiodarone 100 mg p.o. daily.  Postoperatively, she has had several episodes of rapid atrial fibrillation and has been given additional amiodarone IV.  She is currently in a stable sinus rhythm.  If PAF persists, will need to consider anticoagulation.  -Hypokalemia-supplement  -DVT prophylaxis-enoxaparin subcu daily.Mobilize    LOS: 3 days    Antony Odea, PA-C [phone] 02/07/2019   I have seen and examined the patient and agree with the assessment and plan as outlined.  Clinically significantly improved w/ improved oxygenation and  aeration of right lung on CXR.  However, still w/ significant opacity right lung c/w ALI versus pneumonia.  Continue empiric ABx.  Mobilize.  Pulm toilet.  Still going in and out of Afib w/ increased HR.  Will increase Cardizem CD and add low dose beta blocker.  May need to consider anticoagulation.  Rexene Alberts, MD 02/07/2019 11:33 AM

## 2019-02-07 NOTE — Progress Notes (Signed)
Patient currently in Afib 120's -130's. Pt complains of SOB.  Increased O2 to 6L patient SAT 93%. Paged provider awaiting orders.

## 2019-02-07 NOTE — Progress Notes (Signed)
Patient has gotten up to [facility] several times today. Patient has complained of not filling good, very anxious in afternoon and did not want to ambulate. Patient has been in and out of Afib. Provider made aware. Bolus of amio given per req of provider and Amio gtt restarted. Patient was educated on the importance of being ambulatory and up in chair. Patient agreed to attempt at later time.  RN will continue to monitor.

## 2019-02-07 NOTE — Plan of Care (Signed)
  Problem: Education: Goal: Knowledge of General Education information will improve Description Including pain rating scale, medication(s)/side effects and non-pharmacologic comfort measures Outcome: Progressing   Problem: Clinical Measurements: Goal: Will remain free from infection Outcome: Progressing Goal: Diagnostic test results will improve Outcome: Progressing Goal: Cardiovascular complication will be avoided Outcome: Progressing   Problem: Activity: Goal: Risk for activity intolerance will decrease Outcome: Progressing   Problem: Elimination: Goal: Will not experience complications related to bowel motility Outcome: Progressing Goal: Will not experience complications related to urinary retention Outcome: Progressing   Problem: Skin Integrity: Goal: Risk for impaired skin integrity will decrease Outcome: Progressing

## 2019-02-08 ENCOUNTER — Inpatient Hospital Stay (HOSPITAL_COMMUNITY): Payer: Medicare HMO

## 2019-02-08 MED ORDER — ACETYLCYSTEINE 20 % IN SOLN
4.0000 mL | Freq: Three times a day (TID) | RESPIRATORY_TRACT | Status: DC
  Administered 2019-02-08 – 2019-02-11 (×10): 4 mL via RESPIRATORY_TRACT
  Filled 2019-02-08 (×10): qty 4

## 2019-02-08 MED ORDER — ACETYLCYSTEINE 20 % IN SOLN
4.0000 mL | Freq: Three times a day (TID) | RESPIRATORY_TRACT | Status: DC
  Administered 2019-02-08 (×2): 4 mL via RESPIRATORY_TRACT
  Filled 2019-02-08 (×2): qty 4

## 2019-02-08 MED ORDER — AMIODARONE HCL 200 MG PO TABS
200.0000 mg | ORAL_TABLET | Freq: Every day | ORAL | Status: DC
  Administered 2019-02-08 – 2019-02-18 (×11): 200 mg via ORAL
  Filled 2019-02-08 (×11): qty 1

## 2019-02-08 MED ORDER — LEVALBUTEROL HCL 0.63 MG/3ML IN NEBU
0.6300 mg | INHALATION_SOLUTION | Freq: Three times a day (TID) | RESPIRATORY_TRACT | Status: DC
  Administered 2019-02-08 – 2019-02-18 (×29): 0.63 mg via RESPIRATORY_TRACT
  Filled 2019-02-08 (×28): qty 3

## 2019-02-08 NOTE — Progress Notes (Signed)
RT NOTES: Nurse requested RT assessment on patient due to patient being rhonchus and short of breath. MD in room during assessment and explained per CXR patient needed to be working on her Incentive spirometer and flutter valve. Instructed patient on both, patient had poor effort. RN at bedside at this time.

## 2019-02-08 NOTE — Discharge Summary (Addendum)
Physician Discharge Summary  Patient ID: Tonya Bradley MRN: [id number] DOB/AGE: 12-30-1950 68 y.o.  Admit date: 02/04/2019 Discharge date: 02/18/2019  Admission Diagnoses: Right upper lobe lung nodule  Patient Active Problem List   Diagnosis Date Noted  . Colitis 01/01/2019  . Dehydration 01/01/2019  . Hypokalemia 01/01/2019  . Dysphonia 06/19/2018  . Bilateral lower extremity edema 02/16/2018  . Lumbar stenosis 01/19/2018  . GERD (gastroesophageal reflux disease) 11/18/2017  . 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 COPD (chronic obstructive pulmonary disease) ([place]) 09/09/2017  . Varicose veins of bilateral lower extremities with other complications 40/34/7425  . Chronic pain disorder 05/26/2017  . Back pain 05/26/2017  . Insomnia 05/26/2017  . Osteopenia 11/26/2016  . Other emphysema ([place]) 11/07/2016  . Chronic anxiety 11/07/2016  . Chronic hypoxemic respiratory failure ([place]) 11/07/2016  . Constipation 06/12/2016  . Right carotid bruit 04/03/2016  . CAD (coronary artery disease) 04/03/2016  . Tobacco abuse 04/03/2016  . Atrial flutter ([place]) 03/13/2016  . Hiatal hernia   . Reflux esophagitis   . Schatzki's ring   . Dysphagia 03/01/2016  . Solitary pulmonary nodule 03/01/2016  . 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 Microscopic colitis 08/15/2014  . Loose stools 06/08/2014  . BCC (basal cell carcinoma), face 09/13/2012   Discharge Diagnoses: Small cell carcinoma T1N0, stage IA  Patient Active Problem List   Diagnosis Date Noted  . S/P partial lobectomy of lung 02/04/2019  . Colitis 01/01/2019  . Dehydration 01/01/2019  . Hypokalemia 01/01/2019  . Dysphonia 06/19/2018  . Bilateral lower extremity edema 02/16/2018  . Lumbar stenosis 01/19/2018  . GERD (gastroesophageal reflux disease) 11/18/2017  . 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 COPD (chronic obstructive pulmonary disease) ([place])  09/09/2017  . Varicose veins of bilateral lower extremities with other complications 95/63/8756  . Chronic pain disorder 05/26/2017  . Back pain 05/26/2017  . Insomnia 05/26/2017  . Osteopenia 11/26/2016  . Other emphysema ([place]) 11/07/2016  . Chronic anxiety 11/07/2016  . Chronic hypoxemic respiratory failure ([place]) 11/07/2016  . Constipation 06/12/2016  . Right carotid bruit 04/03/2016  . CAD (coronary artery disease) 04/03/2016  . Tobacco abuse 04/03/2016  . Atrial flutter ([place]) 03/13/2016  . Hiatal hernia   . Reflux esophagitis   . Schatzki's ring   . Dysphagia 03/01/2016  . Solitary pulmonary nodule 03/01/2016  . 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 Microscopic colitis 08/15/2014  . Loose stools 06/08/2014  . BCC (basal cell carcinoma), face 09/13/2012   Discharged Condition: good  History of Present Illness:   Tonya Bradley is a 68 yo female with known history of tobacco abuse, COPD, oxygen use at night, A. Flutter S/p ablation, A. Fibrillation, CAD S/p PCI, hyperlipidemia, reflux, depression, anxiety, anemia, and arthritis.  The patient was evaluated by the lung cancer in May 2018.  Her scan at the that time was unremarkable.  Repeat CT scan showed bilobed right upper lobe pulmonary nodule.  The medial portion was 12.6 mm and the more lateral portion 9.9 mm.  Also possible this is 2 separate nodules.  The more proximal nodule is on an airway so it could be post obstructive phenomenon as well.  There was mild centrilobular emphysema, severe thoracic aortic atherosclerosis and coronary atherosclerosis.  There was no mediastinal or hilar adenopathy. She smoked about 1.5 packs a day for 50 years.  She quit smoking about a week ago after starting Chantix.  She uses home oxygen at night  and is on 3 L.  She says she seldom uses it during the day but does usually take it with her.  Her appetite is good.  Weight is stable.  Denies any chest pain, pressure, or tightness.  She is on low-dose  amiodarone for atrial fibrillation.  She saw Dr. Lovena Le recently.  Not had any unusual headaches or visual changes.  No new bone or joint pain.  It was felt the patient should undergo Right VATS with right upper lobectomy.  The risks and benefits of the procedure were explained to the patient and she was agreeable to proceed.    Hospital Course:   Tonya Bradley presented to [facility] on 02/04/2019.  She was taken to the operating room and underwent Right VATs with wedge resection right upper lobe nodule, right upper and  Middle lobectomy, lymph node dissection, and intercostal nerve block.  She tolerated the procedure without difficulty, was extubated, and taken to the PACU in stable condition.  Her chest tube was free from air leak and placed on water seal on 02/05/2019.  She was resumed on Amiodarone for her atrial fibrillation.  She developed post operative agitation and delirium.  This resolved with time.  She was treated for hypokalemia. Her chest tube was placed on water seal.  She developed respiratory distress.  CXR showed near complete opacification of the right hemithorax.  Her chest tube was placed on suction with large evacuation of pleural fluid.  She developed issues producing sputum.  She was started on Mucomyst to try and help loosen phlegm.   Her chest tube output decreased and her chest tube was removed on 02/11/2019.  Follow up CXR showed small right pneumothorax.  Unfortunately her lung opacification persisted, felt to be due to mucous plugging.  She was taken back to the operating room on 02/14/2019 and underwent Video bronchoscopy with removal of mucous plug.  Follow up CXR showed minimal to no improvement.  CT scan of the chest was obtained and showed a large right pleural effusion with consolidation of the right middle lobe.  There was also evidence of debris in the bronchus intermedius.  She underwent Thoracentesis which yielded 500 ml of fluid.  Follow up CXR showed continued mucous  plugging.  We continued aggressive pulmonary toilet.  She was able to expectorate a large amount of sputum.  Repeat CXR showed almost near resolution of opacification of right lung.  There was a small right pleural effusion.  She is ambulating.  Her oxygen has been weaned down to her home use level. She is medically stable for discharge home today.          Significant Diagnostic Studies: radiology:   CT scan:   1. Lung-RADS 4B, suspicious. Additional imaging evaluation or consultation with Pulmonology or Thoracic Surgery recommended. One bilobed versus 2 adjacent right upper lobe pulmonary nodule/nodules, new. 2. Aortic atherosclerosis (ICD10-I70.0), coronary artery atherosclerosis and emphysema (ICD10-J43.9).  These results will be called to the ordering clinician or representative by the Radiologist Assistant, and communication documented in the PACS or zVision Dashboard.  PATHOLOGY:  1. Lung, wedge biopsy/resection, Right upper lobe - SMALL CELL CARCINOMA, 1.7 CM. SEE NOTE. - NO EVIDENCE OF RESIDUAL PLEURAL INVASION. - RESECTION MARGIN IS NEGATIVE FOR CARCINOMA. - NEGATIVE FOR LYMPHOVASCULAR OR PERINEURAL INVASION. - SEE ONCOLOGY TABLE. 2. Lymph node, biopsy, Level 8 - LYMPH NODE, NEGATIVE FOR CARCINOMA (0/1). 3. Lymph node, biopsy, Level 9 - LYMPH NODE WITH A BENIGN FIBROTIC NODULE, NEGATIVE FOR CARCINOMA (  0/1). SEE NOTE. 4. Lymph node, biopsy, Level 7 - LYMPH NODE, NEGATIVE FOR CARCINOMA (0/1). 5. Lymph node, biopsy, Level 7 #2 - LYMPH NODE, NEGATIVE FOR CARCINOMA (0/1). 6. Lymph node, biopsy, Level 11 - LYMPH NODE, NEGATIVE FOR CARCINOMA (0/1). 7. Lymph node, biopsy, Level 10 - LYMPH NODE, NEGATIVE FOR CARCINOMA (0/1). 8. Lymph node, biopsy, Level 12 - LYMPH NODE, NEGATIVE FOR CARCINOMA (0/1). 9. Lymph node, biopsy, Level 12 #2 - LYMPH NODE, NEGATIVE FOR CARCINOMA (0/1). 10. Lymph node, biopsy, Level 13 - LYMPH NODE, NEGATIVE FOR CARCINOMA (0/1). 11. Lymph node,  biopsy, Level 10 #2 - LYMPH NODE, NEGATIVE FOR CARCINOMA (0/1). 12. Lung, resection (segmental or lobe), Right Upper and Middle Lobes - BENIGN LUNG PARENCHYMA WITH SMOKING-RELATED CHANGES. - NEGATIVE FOR CARCINOMA. 1 of 4 FINAL for Filippini, Anupama ANN [phone]) Diagnosis(continued) - TWO LYMPH NODES, NEGATIVE FOR CARCINOMA (0/2). 13. Lymph node, biopsy, Level 4R - NINE LYMPH NODES, NEGATIVE FOR CARCINOMA (0/9). - ONE LYMPH NODE SHOWS A BENIGN FIBROTIC AND CALCIFIED NODULE. SEE NOTE.  Treatments: surgery:   PREOPERATIVE DIAGNOSIS:  Right upper lobe nodule.  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  PROCEDURE: 1.  Right video-assisted thoracoscopy. 2.  Wedge resection, right upper lobe nodule. 3.  Right upper and middle lobectomy. 4.  Lymph node dissection. 5.  Intercostal nerve block levels 3 through 9.  Discharge Exam: Blood pressure 103/60, pulse 67, temperature 100 F (37.8 C), temperature source Axillary, resp. rate (!) 28, height 5\' 2"  (1.575 m), weight 77.7 kg, SpO2 100 %.  General appearance: alert, cooperative and no distress Heart: regular rate and rhythm Lungs: clear to auscultation bilaterally Abdomen: soft, non-tender; bowel sounds normal; no masses,  no organomegaly Extremities: extremities normal, atraumatic, no cyanosis or edema Wound: clean and dry   Discharge disposition: 01-Home or Self Care  Discharge Medications:   Allergies as of 02/18/2019   No Known Allergies     Medication List    TAKE these medications   ALPRAZolam 0.5 MG tablet Commonly known as:  XANAX Take 1 tablet (0.5 mg total) by mouth 2 (two) times daily as needed for anxiety. What changed:  when to take this   amiodarone 200 MG tablet Commonly known as:  PACERONE Take 1/2 tablet (100 mg) by mouth once daily What changed:    how much to take  how to take this  when to take this   aspirin EC 81 MG tablet Take 81 mg by  mouth daily.   atorvastatin 80 MG tablet Commonly known as:  LIPITOR TAKE 1 TABLET(80 MG) BY MOUTH DAILY What changed:  See the new instructions.   cycloSPORINE 0.05 % ophthalmic emulsion Commonly known as:  RESTASIS Place 1 drop into both eyes 2 (two) times daily.   Dexilant 30 MG capsule Generic drug:  Dexlansoprazole TAKE 1 CAPSULE(30 MG) BY MOUTH DAILY What changed:  See the new instructions.   diltiazem 120 MG 24 hr capsule Commonly known as:  CARDIZEM CD TAKE 1 CAPSULE(120 MG) BY MOUTH DAILY What changed:  See the new instructions.   doxepin 10 MG capsule Commonly known as:  SINEQUAN Take 20 mg by mouth at bedtime.   DULoxetine 60 MG capsule Commonly known as:  CYMBALTA Take 1 capsule (60 mg total) by mouth daily. What changed:  when to take this   furosemide 20 MG tablet Commonly known as:  LASIX 1 tab 3 times per day. What changed:    how much to  take  how to take this  when to take this  additional instructions   guaiFENesin 600 MG 12 hr tablet Commonly known as:  MUCINEX Take 2 tablets (1,200 mg total) by mouth 2 (two) times daily. For 7 days, then use as needed   Linzess 290 MCG Caps capsule Generic drug:  linaclotide TAKE 1 CAPSULE(290 MCG) BY MOUTH DAILY BEFORE BREAKFAST What changed:  See the new instructions.   metoprolol tartrate 25 MG tablet Commonly known as:  LOPRESSOR Take 1 tablet (25 mg total) by mouth 2 (two) times daily.   multivitamin with minerals Tabs tablet Take 1 tablet by mouth daily.   nitroGLYCERIN 0.4 MG SL tablet Commonly known as:  NITROSTAT Place 1 tablet (0.4 mg total) under the tongue every 5 (five) minutes as needed for chest pain. Reported on 06/12/2016   oxyCODONE 5 MG immediate release tablet Commonly known as:  Oxy IR/ROXICODONE Take 1-2 tablets (5-10 mg total) by mouth every 4 (four) hours as needed for severe pain.   potassium chloride 10 MEQ tablet Commonly known as:  K-DUR TAKE 1 TABLET(10 MEQ) BY MOUTH  DAILY What changed:  See the new instructions.   rOPINIRole 0.25 MG tablet Commonly known as:  REQUIP Take 2 tablets (0.5 mg total) by mouth at bedtime.   Trelegy Ellipta 100-62.5-25 MCG/INH Aepb Generic drug:  Fluticasone-Umeclidin-Vilant INHALE 1 PUFF INTO THE LUNGS DAILY What changed:  See the new instructions.   varenicline 0.5 MG X 11 & 1 MG X 42 tablet Commonly known as:  CHANTIX PAK Take one 0.5 mg tablet by mouth once daily for 3 days, then increase to one 0.5 mg tablet twice daily for 4 days, then increase to one 1 mg tablet twice daily. What changed:    how much to take  how to take this  when to take this  additional instructions   Ventolin HFA 108 (90 Base) MCG/ACT inhaler Generic drug:  albuterol INHALE 2 PUFFS INTO THE LUNGS EVERY 6 HOURS AS NEEDED FOR WHEEZING OR SHORTNESS OF BREATH What changed:  See the new instructions.   Vitamin D 50 MCG (2000 UT) Caps Take 2,000 Units by mouth daily.      Follow-up Information    Melrose Nakayama, MD Follow up in 2 week(s).   Specialty:  Cardiothoracic Surgery Why:  Office will mail your appointment date and time, please get CXR 30 min prior to your appointment at [place] located on first floor of our office building Contact information: [place] [phone]           Signed: Ellwood Handler, PA-C  02/18/2019, 7:40 AM

## 2019-02-08 NOTE — Progress Notes (Addendum)
      [address]       [place] [address]             [phone]      4 Days Post-Op Procedure(s) (LRB): VIDEO ASSISTED THORACOSCOPY (VATS)/RIGHT UPPER AND MIDDLE LOBECTOMY (Right)   Subjective:  Patient continues to feel poorly.  She is short of breath.  Continues to have pain  Objective: Vital signs in last 24 hours: Temp:  [97.6 F (36.4 C)-98.3 F (36.8 C)] 97.7 F (36.5 C) (03/09 0300) Pulse Rate:  [68-123] 72 (03/09 0300) Cardiac Rhythm: Normal sinus rhythm (03/08 2041) Resp:  [22-25] 25 (03/09 0300) BP: (92-132)/(52-68) 110/56 (03/09 0300) SpO2:  [92 %-95 %] 95 % (03/09 0300)  Intake/Output from previous day: 03/08 0701 - 03/09 0700 In: -  Out: 2400 [Urine:1450; Chest Tube:950] Intake/Output this shift: Total I/O In: -  Out: 8 [Chest Tube:8]  General appearance: alert, cooperative and no distress Heart: regular rate and rhythm Lungs: rhonchi bilaterally Abdomen: soft, non-tender; bowel sounds normal; no masses,  no organomegaly Wound: clean and dry  Lab Results: Recent Labs    02/06/19 0408 02/07/19 0432  WBC 13.7* 10.8*  HGB 10.5* 9.1*  HCT 32.6* 28.2*  PLT 227 224   BMET:  Recent Labs    02/06/19 0408 02/07/19 0432  NA 137 139  K 3.2* 3.8  CL 100 100  CO2 29 30  GLUCOSE 141* 127*  BUN 11 10  CREATININE 0.59 0.55  CALCIUM 8.6* 8.5*    PT/INR: No results for input(s): LABPROT, INR in the last 72 hours. ABG    Component Value Date/Time   PHART 7.321 (L) 02/05/2019 0452   HCO3 28.7 (H) 02/05/2019 0452   O2SAT 89.8 02/05/2019 0452   CBG (last 3)  Recent Labs    02/06/19 2130 02/07/19 0623 02/07/19 1106  GLUCAP 134* 111* 95    Assessment/Plan: S/P Procedure(s) (LRB): VIDEO ASSISTED THORACOSCOPY (VATS)/RIGHT UPPER AND MIDDLE LOBECTOMY (Right)  1. Chest tube- 1+ air leak with cough on suction, CXR with worsening opacification on the right 2. Pulm- underlying COPD, will start Mucomyst nebs to help try and  loosen phelgm, continue flutter valve, wean oxygen as able 3. CV- PAF, currently in NSR- will d/c Amiodarone gtt, continue Lopressor, Cardizem 4. Renal- creatinine WNL, continue Lasix, potassium 5. Lovenox for DVT 6. Dispo- patient in NSR, slight worsening in opacification on right, start Mucomyst for atelectasis and to help loosen secretions, stop Amiodarone drip, repeat CXr in AM   LOS: 4 days    Ellwood Handler 02/08/2019 Patient seen and examined I do not see an air and she did not have one initially Drainage is serous, Not confident the numbers are accurate. Will check again this afternoon to see how much is really draining Acute toxic/ metabolic encephalopathy with delirium Friday evening. Likely secondary to Chantix along with other medications. Improved   [name]. [name], MD [facility] [phone]

## 2019-02-09 ENCOUNTER — Inpatient Hospital Stay (HOSPITAL_COMMUNITY): Payer: Medicare HMO

## 2019-02-09 ENCOUNTER — Ambulatory Visit: Payer: Medicare HMO | Admitting: Emergency Medicine

## 2019-02-09 NOTE — TOC Initial Note (Addendum)
Transition of Care [facility]) - Initial/Assessment Note    Patient Details  Name: Tonya Bradley MRN: [id number] Date of Birth: 28-Jul-1951  [facility] CM/SW Contact:    Maryclare Labrador, RN Phone Number: 02/09/2019, 4:47 PM  Clinical Narrative:   Pt is now 5 days s/p VATS with middle lobectomy.   PTA pt was completely independent from home with husband, pt was able to drive and if needed pts husband can transport pt if needed.   Pt currently still has CT - currently on waterseal.  Pt has PCP Dr Betty Martinique.   Per chest xray 3/10 - areation appears to be worse.  Depending on progression pt may benefit from [facility] at discharge.  CM will continue to follow for discharge needs.              Expected Discharge Plan: [place]     Patient Goals and CMS Choice Patient states their goals for this hospitalization and ongoing recovery are:: (Pt voiced concerns of continued issues with her lungs, she informed CM that she will be happy to get back home)      Expected Discharge Plan and Services Expected Discharge Plan: [place] Discharge Planning Services: CM Consult   Living arrangements for the past 2 months: Single Family Home                          Prior Living Arrangements/Services Living arrangements for the past 2 months: Single Family Home Lives with:: Spouse Patient language and need for interpreter reviewed:: Yes Do you feel safe going back to the place where you live?: Yes      Need for Family Participation in Patient Care: Yes (Comment)(Depending progression, husband will be available) Care giver support system in place?: Yes (comment)   Criminal Activity/Legal Involvement Pertinent to Current Situation/Hospitalization: No - Comment as needed  Activities of Daily Living Home Assistive Devices/Equipment: Eyeglasses, Dentures (specify type), Oxygen ADL Screening (condition at time of admission) Patient's cognitive ability  adequate to safely complete daily activities?: Yes Is the patient deaf or have difficulty hearing?: No Does the patient have difficulty seeing, even when wearing glasses/contacts?: No Does the patient have difficulty concentrating, remembering, or making decisions?: No Patient able to express need for assistance with ADLs?: Yes Does the patient have difficulty dressing or bathing?: No Independently performs ADLs?: Yes (appropriate for developmental age) Does the patient have difficulty walking or climbing stairs?: No Weakness of Legs: None Weakness of Arms/Hands: None  Permission Sought/Granted Permission sought to share information with : Case Manager                Emotional Assessment Appearance:: Appears stated age Attitude/Demeanor/Rapport: Apprehensive, Engaged Affect (typically observed): Accepting, Adaptable     Psych Involvement: No (comment)  Admission diagnosis:  RUL NODULE Patient Active Problem List   Diagnosis Date Noted  . S/P partial lobectomy of lung 02/04/2019  . Colitis 01/01/2019  . Dehydration 01/01/2019  . Hypokalemia 01/01/2019  . Dysphonia 06/19/2018  . Bilateral lower extremity edema 02/16/2018  . Lumbar stenosis 01/19/2018  . GERD (gastroesophageal reflux disease) 11/18/2017  . 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 COPD (chronic obstructive pulmonary disease) ([place]) 09/09/2017  . Varicose veins of bilateral lower extremities with other complications 34/74/2595  . Chronic pain disorder 05/26/2017  . Back pain 05/26/2017  . Insomnia 05/26/2017  . Osteopenia 11/26/2016  .  Other emphysema ([place]) 11/07/2016  . Chronic anxiety 11/07/2016  . Chronic hypoxemic respiratory failure ([place]) 11/07/2016  . Constipation 06/12/2016  . Right carotid bruit 04/03/2016  . CAD (coronary artery disease) 04/03/2016  . Tobacco abuse 04/03/2016  . Atrial flutter ([place]) 03/13/2016  . Hiatal hernia   . Reflux esophagitis   .  Schatzki's ring   . Dysphagia 03/01/2016  . Solitary pulmonary nodule 03/01/2016  . 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 Microscopic colitis 08/15/2014  . Loose stools 06/08/2014  . BCC (basal cell carcinoma), face 09/13/2012   PCP:  Martinique, [name], MD Pharmacy:   [facility] [place] [address] Phone: [phone] Fax: [place] [facility] [place], [place] [address] Phone: [phone] Fax: [phone]     Social Determinants of Health (SDOH) Interventions    Readmission Risk Interventions 30 Day Unplanned Readmission Risk Score     Admission (Current) from 02/04/2019 in [place]  30 Day Unplanned Readmission Risk Score (%)  27 Filed at 02/09/2019 1600     This score is the patient's risk of an unplanned readmission within 30 days of being discharged (0 -100%). The score is based on dignosis, age, lab data, medications, orders, and past utilization.   Low:  0-14.9   Medium: 15-21.9   High: 22-29.9   Extreme: 30 and above       Readmission Risk Prevention Plan 02/09/2019  Transportation Screening Complete  HRI or Home Care Consult Complete  Palliative Care Screening Not Applicable  Some recent data might be hidden

## 2019-02-09 NOTE — Progress Notes (Addendum)
      [address]       [place] [address]             [phone]      5 Days Post-Op Procedure(s) (LRB): VIDEO ASSISTED THORACOSCOPY (VATS)/RIGHT UPPER AND MIDDLE LOBECTOMY (Right)   Subjective:  No new complaints.  Remains short of breath at times.  States she feels like she is breathing better when she is up walking.  Objective: Vital signs in last 24 hours: Temp:  [97.9 F (36.6 C)-99.1 F (37.3 C)] 98.1 F (36.7 C) (03/10 0300) Pulse Rate:  [76-117] 92 (03/10 0300) Cardiac Rhythm: Normal sinus rhythm (03/10 0701) Resp:  [26-34] 28 (03/10 0300) BP: (92-128)/(54-90) 101/62 (03/10 0300) SpO2:  [94 %-98 %] 94 % (03/10 0300)  Intake/Output from previous day: 03/09 0701 - 03/10 0700 In: -  Out: 470 [Chest Tube:470]  General appearance: alert, cooperative and no distress Heart: regular rate and rhythm Lungs: rhonchi bilaterally Abdomen: soft, non-tender; bowel sounds normal; no masses,  no organomegaly Extremities: extremities normal, atraumatic, no cyanosis or edema Wound: clean and dry  Lab Results: Recent Labs    02/07/19 0432  WBC 10.8*  HGB 9.1*  HCT 28.2*  PLT 224   BMET:  Recent Labs    02/07/19 0432  NA 139  K 3.8  CL 100  CO2 30  GLUCOSE 127*  BUN 10  CREATININE 0.55  CALCIUM 8.5*    PT/INR: No results for input(s): LABPROT, INR in the last 72 hours. ABG    Component Value Date/Time   PHART 7.321 (L) 02/05/2019 0452   HCO3 28.7 (H) 02/05/2019 0452   O2SAT 89.8 02/05/2019 0452   CBG (last 3)  Recent Labs    02/06/19 2130 02/07/19 0623 02/07/19 1106  GLUCAP 134* 111* 95    Assessment/Plan: S/P Procedure(s) (LRB): VIDEO ASSISTED THORACOSCOPY (VATS)/RIGHT UPPER AND MIDDLE LOBECTOMY (Right)  1. Chest tube- no air leak on water seal, 450 cc output yesterday (level currently at 1350) leave chest tube in place today 2. PUlm- underlying COPD, aeration appears slightly worsened again today, continue Mucomyst nebs,  Mucinex, may benefit from Bronchoscopy 3. CV- H/O PAF, currently in NSR, continue Lopressor, Cardizem 4. Lovenox for DVT 5. Dispo- patient with slightly worsening aeration on CXR, continue aggressive pulmonary toilet, may benefit from bronchoscopy, maintaining NSR, leave chest tube in place until output decreases, repeat CXR in AM   LOS: 5 days    Ellwood Handler 02/09/2019 Patient seen and examined, agree with above.  Her x-ray looks worse today.  Her drainage from the chest tube is down.  She does not have any air leak.  Emphasized the importance of I-S and flutter valve.  We will continue Mucomyst for now.  May require bronchoscopy for mucous plugging.  [name] [name], MD [facility] [phone]

## 2019-02-10 ENCOUNTER — Inpatient Hospital Stay (HOSPITAL_COMMUNITY): Payer: Medicare HMO

## 2019-02-10 NOTE — Progress Notes (Addendum)
      [address]       [place] [address]             [phone]      6 Days Post-Op Procedure(s) (LRB): VIDEO ASSISTED THORACOSCOPY (VATS)/RIGHT UPPER AND MIDDLE LOBECTOMY (Right)   Subjective:  Continues to have some shortness of breath.  She was able to cough up some sputum yesterday.  Encouraged her to be diligent on IS/flutter valve.  Objective: Vital signs in last 24 hours: Temp:  [97.7 F (36.5 C)-98.2 F (36.8 C)] 98.2 F (36.8 C) (03/11 0300) Pulse Rate:  [34-118] 77 (03/11 0300) Cardiac Rhythm: Other (Comment) (03/11 0700) Resp:  [21-33] 28 (03/11 0300) BP: (98-112)/(48-65) 110/48 (03/11 0300) SpO2:  [91 %-96 %] 96 % (03/11 0300)  Intake/Output from previous day: 03/10 0701 - 03/11 0700 In: 100 [IV Piggyback:100] Out: 470 [Urine:100; Chest Tube:370]  General appearance: alert, cooperative and no distress Heart: regular rate and rhythm Lungs: rhonchi bilaterally Abdomen: soft, non-tender; bowel sounds normal; no masses,  no organomegaly Extremities: extremities normal, atraumatic, no cyanosis or edema Wound: clean and dry  Lab Results: No results for input(s): WBC, HGB, HCT, PLT in the last 72 hours. BMET: No results for input(s): NA, K, CL, CO2, GLUCOSE, BUN, CREATININE, CALCIUM in the last 72 hours.  PT/INR: No results for input(s): LABPROT, INR in the last 72 hours. ABG    Component Value Date/Time   PHART 7.321 (L) 02/05/2019 0452   HCO3 28.7 (H) 02/05/2019 0452   O2SAT 89.8 02/05/2019 0452   CBG (last 3)  Recent Labs    02/07/19 1106  GLUCAP 95    Assessment/Plan: S/P Procedure(s) (LRB): VIDEO ASSISTED THORACOSCOPY (VATS)/RIGHT UPPER AND MIDDLE LOBECTOMY (Right)  1. Chest tube- 350 cc output yesterday (level at 1700 this morning) leave chest tube to water seal 2. Pulm- COPD, improvement in aeration on CXR this morning, still has a ways to go, continue aggressive pulmonary toilet 3. CV- H/O PAF, maintaining NSR, continue  Amiodarone, Cardizem, Lopressor 4. Lovenox for DVT prophylaxis 5. Dispo- patient with some improvement if aeration of lung, continue aggressive pulmonary toilet, CT output decreasing continue to monitor will d/c once output is less than 200, maintaining NSR, continue current care   LOS: 6 days    Ellwood Handler 02/10/2019 Patient seen and examined, agree with above She has cleared some mucous plugging and CXR is definitely improved although still has some atelectasis. Will hold off on bronch for now In atrial bigeminy this AM  Remo Lipps C. [name], MD [facility] [phone]

## 2019-02-10 NOTE — Care Management Important Message (Signed)
Important Message  Patient Details  Name: Tonya Bradley MRN: [id number] Date of Birth: 1951-01-31   Medicare Important Message Given:  Yes    Blakely Gluth P Paxtyn Boyar 02/10/2019, 4:52 PM

## 2019-02-11 ENCOUNTER — Inpatient Hospital Stay (HOSPITAL_COMMUNITY): Payer: Medicare HMO

## 2019-02-11 ENCOUNTER — Other Ambulatory Visit: Payer: Self-pay | Admitting: *Deleted

## 2019-02-11 NOTE — Progress Notes (Addendum)
      [address]       [place] [address]             [phone]      7 Days Post-Op Procedure(s) (LRB): VIDEO ASSISTED THORACOSCOPY (VATS)/RIGHT UPPER AND MIDDLE LOBECTOMY (Right)   Subjective:   Up in chair no specific complaints.  Instructed patient on continuing diligent use of IS/Flutter valve  Objective: Vital signs in last 24 hours: Temp:  [98.4 F (36.9 C)-98.9 F (37.2 C)] 98.6 F (37 C) (03/12 0400) Pulse Rate:  [70-119] 70 (03/12 0400) Cardiac Rhythm: Normal sinus rhythm (03/12 0400) Resp:  [20-35] 35 (03/12 0656) BP: (102-110)/(52-57) 103/57 (03/12 0400) SpO2:  [91 %-98 %] 94 % (03/12 0723)  HIntake/Output from previous day: 03/11 0701 - 03/12 0700 In: 983.3 [P.O.:570; I.V.:113.3; IV Piggyback:300] Out: 190 [Chest Tube:190]  General appearance: alert, cooperative and no distress Heart: regular rate and rhythm Lungs: rhonchi bilaterally Abdomen: soft, non-tender; bowel sounds normal; no masses,  no organomegaly Wound: clean and dry  Lab Results: No results for input(s): WBC, HGB, HCT, PLT in the last 72 hours. BMET: No results for input(s): NA, K, CL, CO2, GLUCOSE, BUN, CREATININE, CALCIUM in the last 72 hours.  PT/INR: No results for input(s): LABPROT, INR in the last 72 hours. ABG    Component Value Date/Time   PHART 7.321 (L) 02/05/2019 0452   HCO3 28.7 (H) 02/05/2019 0452   O2SAT 89.8 02/05/2019 0452   CBG (last 3)  No results for input(s): GLUCAP in the last 72 hours.  Assessment/Plan: S/P Procedure(s) (LRB): VIDEO ASSISTED THORACOSCOPY (VATS)/RIGHT UPPER AND MIDDLE LOBECTOMY (Right)  1. Chest tube- no air leak, 190 cc output yesterday- will d/c chest tube today 2. Pulm- aeration has worsened again, conintinue aggressive pulmonary toilet, will likley need bronchoscopy in AM 3. CV- PAF, maintaining NSR on Amiodarone, Cardizem, Loprssor 4. Continue Lovenox 5. DIspo- patient needs aggressive pulmonary toilet, if CXR doesn't  improve, will need bronch tomorrow, d/c chest tube   LOS: 7 days    Tonya Bradley 02/11/2019 Patient seen and examined, agree with above CXR shows RUL atelectasis again this AM Path - Small cell carcinoma T1,N0- Chest tube dc'ed earlier- f/u CXR shows a small pneumothorax, but significantly improved aeration. She has not had an air leak in several days, so I suspect more of a space than a true pneumo.  [name] [name], MD [facility] [phone]

## 2019-02-11 NOTE — Progress Notes (Signed)
The proposed treatment discussed in cancer conference 02/11/2019 is for discussion purpose only and is not a binding recommendation. The patient was not physically examined nor present for their treatment options.  Therefore, final treatment plans cannot be decided.

## 2019-02-12 ENCOUNTER — Inpatient Hospital Stay (HOSPITAL_COMMUNITY): Payer: Medicare HMO

## 2019-02-12 ENCOUNTER — Ambulatory Visit: Payer: Medicare HMO | Admitting: Physical Medicine & Rehabilitation

## 2019-02-12 NOTE — Progress Notes (Addendum)
[address]       [place] [address]             [phone]      8 Days Post-Op Procedure(s) (LRB): VIDEO ASSISTED THORACOSCOPY (VATS)/RIGHT UPPER AND MIDDLE LOBECTOMY (Right) Subjective: Feels ok, some sputum production, SOB is improving  Objective: Vital signs in last 24 hours: Temp:  [97.6 F (36.4 C)-98.2 F (36.8 C)] 97.9 F (36.6 C) (03/13 0733) Pulse Rate:  [66-110] 66 (03/13 0300) Cardiac Rhythm: Normal sinus rhythm (03/13 0300) Resp:  [20-26] 22 (03/13 0733) BP: (103-116)/(44-63) 110/57 (03/13 0733) SpO2:  [93 %-96 %] 96 % (03/13 0300)  Hemodynamic parameters for last 24 hours:    Intake/Output from previous day: 03/12 0701 - 03/13 0700 In: 810 [P.O.:480; I.V.:230; IV Piggyback:100] Out: 80 [Chest Tube:80] Intake/Output this shift: No intake/output data recorded.  General appearance: alert, cooperative and no distress Heart: regular rate and rhythm Lungs: dim on right Abdomen: soft, non-tender Extremities: no calf tenderness Wound: incis healing well  Lab Results: No results for input(s): WBC, HGB, HCT, PLT in the last 72 hours. BMET: No results for input(s): NA, K, CL, CO2, GLUCOSE, BUN, CREATININE, CALCIUM in the last 72 hours.  PT/INR: No results for input(s): LABPROT, INR in the last 72 hours. ABG    Component Value Date/Time   PHART 7.321 (L) 02/05/2019 0452   HCO3 28.7 (H) 02/05/2019 0452   O2SAT 89.8 02/05/2019 0452   CBG (last 3)  No results for input(s): GLUCAP in the last 72 hours.  Meds Scheduled Meds: . acetylcysteine  4 mL Nebulization TID  . amiodarone  200 mg Oral Daily  . aspirin EC  81 mg Oral Daily  . atorvastatin  80 mg Oral Daily  . bisacodyl  10 mg Oral Daily  . bupivacaine liposome  20 mL Infiltration Once  . cycloSPORINE  1 drop Both Eyes BID  . diltiazem  240 mg Oral Daily  . doxepin  20 mg Oral QHS  . DULoxetine  60 mg Oral QHS  . enoxaparin (LOVENOX) injection  40 mg Subcutaneous Daily  .  furosemide  60 mg Oral Daily  . guaiFENesin  1,200 mg Oral BID  . levalbuterol  0.63 mg Nebulization TID  . metoprolol tartrate  25 mg Oral BID  . pantoprazole  40 mg Oral Q1200  . potassium chloride  10 mEq Oral Daily  . rOPINIRole  0.5 mg Oral QHS  . senna-docusate  1 tablet Oral QHS   Continuous Infusions: . sodium chloride 10 mL/hr at 02/12/19 0300  . ceFEPime (MAXIPIME) IV 1 g (02/12/19 0132)  . potassium chloride     PRN Meds:.ALPRAZolam, ipratropium, linaclotide, metoprolol tartrate, ondansetron (ZOFRAN) IV, oxyCODONE, potassium chloride  Xrays Dg Chest Port 1 View  Result Date: 02/11/2019 CLINICAL DATA:  Shortness of breath EXAM: PORTABLE CHEST 1 VIEW COMPARISON:  Yesterday FINDINGS: Worsening aeration on the right with complete white out of the right chest. Right chest tube in place. Probable cardiomegaly. Stable streaky density on the left. Unremarkable right IJ line positioning. IMPRESSION: Worsening right-sided aeration with complete white out. Electronically Signed   By: Monte Fantasia M.D.   On: 02/11/2019 09:13   Dg Chest Port 1v Same Day  Result Date: 02/11/2019 CLINICAL DATA:  Status post chest tube removal. EXAM: PORTABLE CHEST 1 VIEW COMPARISON:  Two-view chest x-ray 02/11/2019 at 6:10 a.m. FINDINGS: Heart size is normal a right IJ line is stable. The right sided  chest tube was removed. Pneumothorax is now present. This is 15-20%. There is no midline shift. The left lung is clear. IMPRESSION: 1. New right sided pneumothorax measuring 15-20% following removal of right-sided chest tube. 2. Right IJ line remains. These results were called by telephone at the time of interpretation on 02/11/2019 at 2:29 pm to Dr. Ellwood Handler , who verbally acknowledged these results. Electronically Signed   By: San Morelle M.D.   On: 02/11/2019 14:29    Assessment/Plan: S/P Procedure(s) (LRB): VIDEO ASSISTED THORACOSCOPY (VATS)/RIGHT UPPER AND MIDDLE LOBECTOMY (Right)  1 CXR c/w  bronchial plugging - possible bronchoscopy needed 2 hemodyn stable in sinus rhythm, no further afib 3 no new labs     LOS: 8 days    John Giovanni [facility] 02/12/2019  Pager [phone] Patient seen and examined, agree with above She does not need bronch at this point. Has cleared her plugs 2x before, but does need to continue with cough, IS, flutter  Remo Lipps C. [name], MD [facility] [phone]

## 2019-02-12 NOTE — Progress Notes (Signed)
Ambulated along the hallway with walker , tolerated 250 ft walk with slight SOB. Continue to monitor

## 2019-02-12 NOTE — Progress Notes (Signed)
Ambulated along the hallway about 250 feet, noted with mild shortness of breath on 5L [place]. Continue to monitor.

## 2019-02-12 NOTE — Plan of Care (Signed)

## 2019-02-13 ENCOUNTER — Inpatient Hospital Stay (HOSPITAL_COMMUNITY): Payer: Medicare HMO

## 2019-02-13 LAB — CBC
HCT: 30.2 % — ABNORMAL LOW (ref 36.0–46.0)
Hemoglobin: 9.6 g/dL — ABNORMAL LOW (ref 12.0–15.0)
MCH: 28.8 pg (ref 26.0–34.0)
MCHC: 31.8 g/dL (ref 30.0–36.0)
MCV: 90.7 fL (ref 80.0–100.0)
Platelets: 410 10*3/uL — ABNORMAL HIGH (ref 150–400)
RBC: 3.33 MIL/uL — ABNORMAL LOW (ref 3.87–5.11)
RDW: 12.9 % (ref 11.5–15.5)
WBC: 11 10*3/uL — ABNORMAL HIGH (ref 4.0–10.5)
nRBC: 0 % (ref 0.0–0.2)

## 2019-02-13 LAB — BASIC METABOLIC PANEL
Anion gap: 14 (ref 5–15)
BUN: 10 mg/dL (ref 8–23)
CALCIUM: 9.1 mg/dL (ref 8.9–10.3)
CO2: 31 mmol/L (ref 22–32)
Chloride: 94 mmol/L — ABNORMAL LOW (ref 98–111)
Creatinine, Ser: 0.54 mg/dL (ref 0.44–1.00)
GFR calc non Af Amer: >60 mL/min (ref >60)
Glucose, Bld: 126 mg/dL — ABNORMAL HIGH (ref 70–99)
Potassium: 3 mmol/L — ABNORMAL LOW (ref 3.5–5.1)
SODIUM: 139 mmol/L (ref 135–145)

## 2019-02-13 MED ORDER — POTASSIUM CHLORIDE CRYS ER 10 MEQ PO TBCR
20.0000 meq | EXTENDED_RELEASE_TABLET | Freq: Every day | ORAL | Status: DC
  Administered 2019-02-14 – 2019-02-18 (×5): 20 meq via ORAL
  Filled 2019-02-13 (×7): qty 2

## 2019-02-13 MED ORDER — POTASSIUM CHLORIDE CRYS ER 20 MEQ PO TBCR
40.0000 meq | EXTENDED_RELEASE_TABLET | Freq: Two times a day (BID) | ORAL | Status: AC
  Administered 2019-02-13 (×2): 40 meq via ORAL
  Filled 2019-02-13 (×2): qty 2

## 2019-02-13 MED ORDER — LINACLOTIDE 145 MCG PO CAPS
290.0000 ug | ORAL_CAPSULE | Freq: Every day | ORAL | Status: DC | PRN
  Administered 2019-02-15 – 2019-02-16 (×2): 290 ug via ORAL
  Filled 2019-02-13 (×3): qty 2

## 2019-02-13 MED ORDER — SODIUM CHLORIDE 3 % IN NEBU
4.0000 mL | INHALATION_SOLUTION | Freq: Two times a day (BID) | RESPIRATORY_TRACT | Status: AC
  Administered 2019-02-13 – 2019-02-16 (×6): 4 mL via RESPIRATORY_TRACT
  Filled 2019-02-13 (×6): qty 4

## 2019-02-13 MED ORDER — LEVALBUTEROL HCL 0.63 MG/3ML IN NEBU: 0.6300 mg | INHALATION_SOLUTION | Freq: Four times a day (QID) | RESPIRATORY_TRACT | Status: DC | PRN

## 2019-02-13 MED ORDER — LINACLOTIDE 145 MCG PO CAPS
290.0000 ug | ORAL_CAPSULE | Freq: Once | ORAL | Status: AC
  Administered 2019-02-13: 290 ug via ORAL
  Filled 2019-02-13: qty 2

## 2019-02-13 NOTE — Progress Notes (Signed)
      [address]       [place] [address]             [phone]       9 Days Post-Op Procedure(s) (LRB): VIDEO ASSISTED THORACOSCOPY (VATS)/RIGHT UPPER AND MIDDLE LOBECTOMY (Right)  Subjective: Patient states breathing seems to slowly be getting better. She is asking when she can go home.  Objective: Vital signs in last 24 hours: Temp:  [97.9 F (36.6 C)-98.8 F (37.1 C)] 97.9 F (36.6 C) (03/14 0738) Pulse Rate:  [65-81] 81 (03/14 0738) Cardiac Rhythm: Normal sinus rhythm (03/14 0700) Resp:  [16-23] 23 (03/14 0738) BP: (103-119)/(48-62) 109/62 (03/14 0738) SpO2:  [89 %-100 %] 94 % (03/14 0738)     Intake/Output from previous day: 03/13 0701 - 03/14 0700 In: 67 [P.O.:360; I.V.:230; IV Piggyback:300] Out: -    Physical Exam:  Cardiovascular: RRR Pulmonary: Clear to auscultation on left and diminished on the right Abdomen: Soft, non tender, bowel sounds present. Extremities: SCDs in place Wounds: Clean and dry.  No erythema or signs of infection.   Lab Results: CBC: Recent Labs    02/13/19 0452  WBC 11.0*  HGB 9.6*  HCT 30.2*  PLT 410*   BMET:  Recent Labs    02/13/19 0452  NA 139  K 3.0*  CL 94*  CO2 31  GLUCOSE 126*  BUN 10  CREATININE 0.54  CALCIUM 9.1    PT/INR: No results for input(s): LABPROT, INR in the last 72 hours. ABG:  INR: Will add last result for INR, ABG once components are confirmed Will add last 4 CBG results once components are confirmed  Assessment/Plan:  1. CV - History of a fib, s/p ablation. Previous a fib with RVR. SR, first degree heart block this am. On Amiodarone 200 mg daily, Cardizem CD 240 mg daily, and Lopressor 25 mg bid. 2.  Pulmonary - History of COPD, on home oxygen. On 5 L oxygen via [place]. CXR this am shows patient rotated to the right, near complete opacification of right hemithorax (has had). Continue Mucinex, Xopenex.Encourage incentive spirometer. 3. Anemia-H and H this am up to 9.6  and 30.2 4. Supplement potassium 5. ID- On Cefepime 6. Linzess at patient request for constipation  Sharalyn Ink ZimmermanPA-C 02/13/2019,8:36 AM [phone]

## 2019-02-13 NOTE — Progress Notes (Signed)
Was the fall witnessed: No  Patient condition before and after the fall: Pt was stable A/O x4 before fall. At nurse station RN heard patients belongings on bedside table falling in the floor. RN ran in room pt was on hands and knees, stated "she didn't know how she fell in the floor" per pt she did not hit her head. She fell off of right side of bed, IV pole was plugged in and attached to patient on the left side of bed on left arm. RN personally had plugged IV pole within the hour. When RN walked in room IV pole was unplugged and at the foot of the bed. Patient stated "she didn't know how the IV pole got there and that she did not think she moved it, but could have and not remembered. She stated that she was just "dozing off to sleep". Pt was assessed, no injuries noted on assessed, no pain. Will continue to monitor for any signs of altered mental status or injury.   Patient's reaction to the fall: Patient stated she was "okay" and "did not know how she fell out of the bed"  *  Any interventions and vital signs: Post fall assessment completed no complaints of pain or injury, Bed alarm set, vitals taken BP: 119/52 Pulse:86 HR86 RR:22 O2: 23 Temp:99.1.

## 2019-02-14 ENCOUNTER — Inpatient Hospital Stay (HOSPITAL_COMMUNITY): Payer: Medicare HMO | Admitting: Certified Registered Nurse Anesthetist

## 2019-02-14 ENCOUNTER — Inpatient Hospital Stay (HOSPITAL_COMMUNITY): Payer: Medicare HMO

## 2019-02-14 ENCOUNTER — Encounter (HOSPITAL_COMMUNITY)
Admission: RE | Disposition: A | Discharge status: Home Health | Payer: Self-pay | Source: Home / Self Care | Attending: Thoracic Surgery (Cardiothoracic Vascular Surgery)

## 2019-02-14 HISTORY — PX: VIDEO BRONCHOSCOPY: SHX5072

## 2019-02-14 SURGERY — BRONCHOSCOPY, VIDEO-ASSISTED
Anesthesia: General

## 2019-02-14 MED ORDER — LACTATED RINGERS IV SOLN
INTRAVENOUS | Status: DC | PRN
  Administered 2019-02-14: 09:00:00 via INTRAVENOUS

## 2019-02-14 MED ORDER — PROPOFOL 10 MG/ML IV BOLUS
INTRAVENOUS | Status: AC
  Filled 2019-02-14: qty 20

## 2019-02-14 MED ORDER — 0.9 % SODIUM CHLORIDE (POUR BTL) OPTIME
TOPICAL | Status: DC | PRN
  Administered 2019-02-14: 1000 mL

## 2019-02-14 MED ORDER — MIDAZOLAM HCL 2 MG/2ML IJ SOLN
INTRAMUSCULAR | Status: AC
  Filled 2019-02-14: qty 2

## 2019-02-14 MED ORDER — ALBUTEROL SULFATE (2.5 MG/3ML) 0.083% IN NEBU
INHALATION_SOLUTION | RESPIRATORY_TRACT | Status: AC
  Filled 2019-02-14: qty 3

## 2019-02-14 MED ORDER — ONDANSETRON HCL 4 MG/2ML IJ SOLN
INTRAMUSCULAR | Status: AC
  Filled 2019-02-14: qty 2

## 2019-02-14 MED ORDER — FENTANYL CITRATE (PF) 250 MCG/5ML IJ SOLN
INTRAMUSCULAR | Status: AC
  Filled 2019-02-14: qty 5

## 2019-02-14 MED ORDER — PROPOFOL 1000 MG/100ML IV EMUL
INTRAVENOUS | Status: AC
  Filled 2019-02-14: qty 100

## 2019-02-14 MED ORDER — ROCURONIUM BROMIDE 100 MG/10ML IV SOLN
INTRAVENOUS | Status: DC | PRN
  Administered 2019-02-14: 20 mg via INTRAVENOUS

## 2019-02-14 MED ORDER — MIDAZOLAM HCL 2 MG/2ML IJ SOLN
INTRAMUSCULAR | Status: DC | PRN
  Administered 2019-02-14: 1 mg via INTRAVENOUS

## 2019-02-14 MED ORDER — EPINEPHRINE PF 1 MG/ML IJ SOLN
INTRAMUSCULAR | Status: AC
  Filled 2019-02-14: qty 1

## 2019-02-14 MED ORDER — FENTANYL CITRATE (PF) 250 MCG/5ML IJ SOLN
INTRAMUSCULAR | Status: DC | PRN
  Administered 2019-02-14: 50 ug via INTRAVENOUS

## 2019-02-14 MED ORDER — DEXAMETHASONE SODIUM PHOSPHATE 10 MG/ML IJ SOLN
INTRAMUSCULAR | Status: DC | PRN
  Administered 2019-02-14: 10 mg via INTRAVENOUS

## 2019-02-14 MED ORDER — ALBUTEROL SULFATE (2.5 MG/3ML) 0.083% IN NEBU
2.5000 mg | INHALATION_SOLUTION | Freq: Once | RESPIRATORY_TRACT | Status: AC
  Administered 2019-02-14: 2.5 mg via RESPIRATORY_TRACT

## 2019-02-14 MED ORDER — SUGAMMADEX SODIUM 200 MG/2ML IV SOLN
INTRAVENOUS | Status: DC | PRN
  Administered 2019-02-14: 200 mg via INTRAVENOUS

## 2019-02-14 MED ORDER — EPINEPHRINE PF 1 MG/ML IJ SOLN
INTRAMUSCULAR | Status: DC | PRN
  Administered 2019-02-14: 1 mg

## 2019-02-14 SURGICAL SUPPLY — 35 items
ADAPTER VALVE BIOPSY EBUS (MISCELLANEOUS) IMPLANT
ADPTR VALVE BIOPSY EBUS (MISCELLANEOUS)
BRUSH CYTOL CELLEBRITY 1.5X140 (MISCELLANEOUS) IMPLANT
CANISTER SUCT 3000ML PPV (MISCELLANEOUS) ×3 IMPLANT
CONT SPEC 4OZ CLIKSEAL STRL BL (MISCELLANEOUS) ×6 IMPLANT
COVER BACK TABLE 60X90IN (DRAPES) ×3 IMPLANT
COVER WAND RF STERILE (DRAPES) ×3 IMPLANT
FILTER STRAW FLUID ASPIR (MISCELLANEOUS) ×2 IMPLANT
FORCEPS BIOP RJ4 1.8 (CUTTING FORCEPS) IMPLANT
FORCEPS RADIAL JAW LRG 4 PULM (INSTRUMENTS) IMPLANT
GAUZE SPONGE 4X4 12PLY STRL (GAUZE/BANDAGES/DRESSINGS) ×3 IMPLANT
GLOVE BIO SURGEON STRL SZ 6 (GLOVE) ×2 IMPLANT
GLOVE SURG SIGNA 7.5 PF LTX (GLOVE) ×3 IMPLANT
GOWN STRL REUS W/ TWL LRG LVL3 (GOWN DISPOSABLE) ×1 IMPLANT
GOWN STRL REUS W/ TWL XL LVL3 (GOWN DISPOSABLE) ×1 IMPLANT
GOWN STRL REUS W/TWL LRG LVL3 (GOWN DISPOSABLE) ×2
GOWN STRL REUS W/TWL XL LVL3 (GOWN DISPOSABLE) ×2
KIT CLEAN ENDO COMPLIANCE (KITS) ×3 IMPLANT
KIT TURNOVER KIT B (KITS) ×3 IMPLANT
MARKER SKIN DUAL TIP RULER LAB (MISCELLANEOUS) ×3 IMPLANT
NS IRRIG 1000ML POUR BTL (IV SOLUTION) ×3 IMPLANT
OIL SILICONE PENTAX (PARTS (SERVICE/REPAIRS)) ×3 IMPLANT
PAD ARMBOARD 7.5X6 YLW CONV (MISCELLANEOUS) ×6 IMPLANT
RADIAL JAW LRG 4 PULMONARY (INSTRUMENTS)
SYR 20ML ECCENTRIC (SYRINGE) ×6 IMPLANT
SYR 5ML LL (SYRINGE) ×7 IMPLANT
SYR 5ML LUER SLIP (SYRINGE) ×3 IMPLANT
TOWEL GREEN STERILE (TOWEL DISPOSABLE) ×3 IMPLANT
TOWEL GREEN STERILE FF (TOWEL DISPOSABLE) ×3 IMPLANT
TRAP SPECIMEN MUCOUS 40CC (MISCELLANEOUS) ×3 IMPLANT
TUBE CONNECTING 20'X1/4 (TUBING) ×1
TUBE CONNECTING 20X1/4 (TUBING) ×2 IMPLANT
VALVE BIOPSY  SINGLE USE (MISCELLANEOUS) ×2
VALVE BIOPSY SINGLE USE (MISCELLANEOUS) ×1 IMPLANT
VALVE SUCTION BRONCHIO DISP (MISCELLANEOUS) ×3 IMPLANT

## 2019-02-14 NOTE — Anesthesia Postprocedure Evaluation (Signed)
Anesthesia Post Note  Patient: Tonya Bradley  Procedure(s) Performed: BRONCHOSCOPY WITH SEDATION (N/A )     Patient location during evaluation: PACU Anesthesia Type: General Level of consciousness: awake and alert Pain management: pain level controlled Vital Signs Assessment: post-procedure vital signs reviewed and stable Respiratory status: spontaneous breathing, nonlabored ventilation, respiratory function stable and patient connected to nasal cannula oxygen Cardiovascular status: blood pressure returned to baseline and stable Postop Assessment: no apparent nausea or vomiting Anesthetic complications: no    Last Vitals:  Vitals:   02/14/19 1320 02/14/19 1324  BP:    Pulse:  89  Resp:  (!) 24  Temp:    SpO2: 93% 93%    Last Pain:  Vitals:   02/14/19 0739  TempSrc: Oral  PainSc:                  Catalina Gravel

## 2019-02-14 NOTE — Transfer of Care (Signed)
Immediate Anesthesia Transfer of Care Note  Patient: Tonya Bradley  Procedure(s) Performed: BRONCHOSCOPY WITH SEDATION (N/A )  Patient Location: PACU  Anesthesia Type:General  Level of Consciousness: awake and alert   Airway & Oxygen Therapy: Patient Spontanous Breathing and non-rebreather face mask  Post-op Assessment: Report given to RN and Post -op Vital signs reviewed and stable  Post vital signs: Reviewed and stable  Last Vitals:  Vitals Value Taken Time  BP 129/60 02/14/2019 10:56 AM  Temp    Pulse 83 02/14/2019 10:58 AM  Resp 35 02/14/2019 10:58 AM  SpO2 94 % 02/14/2019 10:58 AM  Vitals shown include unvalidated device data.  Last Pain:  Vitals:   02/14/19 0739  TempSrc: Oral  PainSc:       Patients Stated Pain Goal: 0 (14/48/18 5631)  Complications: No apparent anesthesia complications

## 2019-02-14 NOTE — Op Note (Signed)
Tonya Bradley, Tonya Bradley [facility] MEDICAL RECORD [id number] ACCOUNT [id number] DATE OF BIRTH:[birth year] FACILITY: [facility] LOCATION: [facility] PHYSICIAN:Tanveer Dobberstein Chaya Jan, MD  OPERATIVE REPORT  DATE OF PROCEDURE:  02/14/2019  PREOPERATIVE DIAGNOSIS:  Right lower lobe atelectasis secondary to mucus plugging.  POSTOPERATIVE DIAGNOSIS:  Right lower lobe atelectasis secondary to mucus plugging.  PROCEDURE:  Bronchoscopy with bronchoalveolar lavage.  SURGEON:  Modesto Charon, MD  ASSISTANT:  None.  ANESTHESIA:  General.  FINDINGS:  Thick white mucus plugging the right main stem bronchus.  Upper lobe and middle lobe bronchial closures were intact.  CLINICAL NOTE:  Tonya Bradley is a 68 year old woman who had recently undergone right upper and middle lobectomy for lung cancer.  She developed a complete right lung atelectasis suspected to be secondary to mucus plugging.  She was unable to clear the  secretions and was advised to undergo bronchoscopy.  The indications, risks, benefits, and alternatives were discussed in detail with the patient.  She understood and accepted the risks and agreed to proceed.  OPERATIVE NOTE:  [name] was brought to the operating room on 02/14/2019.  We had initially planned to do the procedure with sedation, but her oxygen saturations were in the mid 80s.  It was felt that it would be difficult to maintain her  saturations while trying to do the procedure with only light sedation and the decision was made to anesthetize the patient and intubate her.  A timeout was performed.  Flexible fiberoptic bronchoscopy was performed via the endotracheal tube.  There was  thick white secretions in the trachea and filling the endotracheal tube.  There was some spillover into the left side, but no significant plugs on the left side.  On the right, there was thick mucus occluding the main stem bronchus.  The mucus  was cleared by irrigating with saline and suctioning and the specimen  was sent for cultures.  The upper lobe and middle lobe bronchial closures appeared intact.  The lower lobe segmental bronchi were copiously irrigated with saline multiple times until there was  no longer return of any mucus.  The bronchoscope was withdrawn.    The patient then was hand bagged and then extubated in the operating room and taken to the [facility] in good condition.  AN/NUANCE  D:02/14/2019 T:02/14/2019 JOB:[id number]

## 2019-02-14 NOTE — Anesthesia Procedure Notes (Signed)
Procedure Name: Intubation Date/Time: 02/14/2019 10:17 AM Performed by: Clearnce Sorrel, CRNA Pre-anesthesia Checklist: Patient identified, Emergency Drugs available, Suction available, Patient being monitored and Timeout performed Patient Re-evaluated:Patient Re-evaluated prior to induction Oxygen Delivery Method: Circle system utilized Preoxygenation: Pre-oxygenation with 100% oxygen Induction Type: IV induction and Rapid sequence Laryngoscope Size: Mac and 3 Grade View: Grade I Tube type: Oral Tube size: 8.5 mm Number of attempts: 1 Airway Equipment and Method: Stylet Placement Confirmation: ETT inserted through vocal cords under direct vision,  positive ETCO2 and breath sounds checked- equal and bilateral Secured at: 22 cm Tube secured with: Tape Dental Injury: Teeth and Oropharynx as per pre-operative assessment

## 2019-02-14 NOTE — Brief Op Note (Signed)
02/14/2019  10:53 AM  PATIENT:  Tonya Bradley  68 y.o. female  PRE-OPERATIVE DIAGNOSIS:  mucous plug  POST-OPERATIVE DIAGNOSIS:  mucous plug  PROCEDURE:  BRONCHOSCOPY  SURGEON:  Surgeon(s) and Role:    * Melrose Nakayama, MD - Primary  PHYSICIAN ASSISTANT:   ASSISTANTS: none   ANESTHESIA:   general  EBL: none  BLOOD ADMINISTERED:none  DRAINS: none   LOCAL MEDICATIONS USED:  NONE  SPECIMEN:  Source of Specimen:  BAL  DISPOSITION OF SPECIMEN:  micro  COUNTS:  NO endo  TOURNIQUET:  * No tourniquets in log *  DICTATION: .Other Dictation: Dictation Number -  PLAN OF CARE: inpatient  PATIENT DISPOSITION:  PACU - hemodynamically stable.   Delay start of Pharmacological VTE agent (>24hrs) due to surgical blood loss or risk of bleeding: no

## 2019-02-14 NOTE — Anesthesia Preprocedure Evaluation (Addendum)
Anesthesia Evaluation  Patient identified by MRN, date of birth, ID band Patient awake    Reviewed: Allergy & Precautions, NPO status , Patient's Chart, lab work & pertinent test results  Airway Mallampati: II  TM Distance: >3 FB Neck ROM: Full    Dental  (+) Dental Advisory Given, Edentulous Lower, Edentulous Upper   Pulmonary pneumonia, COPD, Current Smoker,  S.p RIGHT VATS for upper and middle lobectomy Atelectasis secondary to mucous plugging   + rhonchi  + decreased breath sounds+ wheezing  rales    Cardiovascular + CAD  Normal cardiovascular exam+ dysrhythmias Atrial Fibrillation  Rhythm:Regular Rate:Normal     Neuro/Psych PSYCHIATRIC DISORDERS Anxiety Depression negative neurological ROS     GI/Hepatic Neg liver ROS, hiatal hernia, GERD  ,  Endo/Other  negative endocrine ROSObesity   Renal/GU negative Renal ROS     Musculoskeletal  (+) Arthritis ,   Abdominal   Peds  Hematology  (+) Blood dyscrasia, anemia ,   Anesthesia Other Findings Day of surgery medications reviewed with the patient.  Reproductive/Obstetrics                            Anesthesia Physical Anesthesia Plan  ASA: III  Anesthesia Plan: General   Post-op Pain Management:    Induction: Intravenous  PONV Risk Score and Plan: 2 and Treatment may vary due to age or medical condition, Midazolam and Dexamethasone  Airway Management Planned: Oral ETT  Additional Equipment:   Intra-op Plan:   Post-operative Plan: Extubation in OR  Informed Consent: I have reviewed the patients History and Physical, chart, labs and discussed the procedure including the risks, benefits and alternatives for the proposed anesthesia with the patient or authorized representative who has indicated his/her understanding and acceptance.     Dental advisory given  Plan Discussed with: CRNA and Anesthesiologist  Anesthesia Plan  Comments: (Will proceed with GETA after discussion with surgeon.)       Anesthesia Quick Evaluation

## 2019-02-14 NOTE — Interval H&P Note (Signed)
History and Physical Interval Note:  02/14/2019 9:43 AM  Tonya Bradley  has presented today for surgery, with the diagnosis of resp..  The various methods of treatment have been discussed with the patient and family. After consideration of risks, benefits and other options for treatment, the patient has consented to  Procedure(s): BRONCHOSCOPY WITH SEDATION (N/A) as a surgical intervention.  The patient's history has been reviewed, patient examined, no change in status, stable for surgery.  I have reviewed the patient's chart and labs.  Questions were answered to the patient's satisfaction.     Melrose Nakayama

## 2019-02-14 NOTE — H&P (View-Only) (Signed)
10 Days Post-Op Procedure(s) (LRB): VIDEO ASSISTED THORACOSCOPY (VATS)/RIGHT UPPER AND MIDDLE LOBECTOMY (Right) Subjective: No complaints this AM  Objective: Vital signs in last 24 hours: Temp:  [97.9 F (36.6 C)-99.1 F (37.3 C)] 97.9 F (36.6 C) (03/15 0739) Pulse Rate:  [66-100] 71 (03/15 0739) Cardiac Rhythm: Normal sinus rhythm (03/14 2127) Resp:  [23-30] 25 (03/15 0739) BP: (93-119)/(51-72) 109/60 (03/15 0739) SpO2:  [88 %-97 %] 96 % (03/15 0739)  Hemodynamic parameters for last 24 hours:    Intake/Output from previous day: 03/14 0701 - 03/15 0700 In: 120 [P.O.:120] Out: 1000 [Urine:1000] Intake/Output this shift: No intake/output data recorded.  General appearance: alert, cooperative and no distress Neurologic: intact Heart: regular rate and rhythm Lungs: absent BS right Wound: clean and dry  Lab Results: Recent Labs    02/13/19 0452  WBC 11.0*  HGB 9.6*  HCT 30.2*  PLT 410*   BMET:  Recent Labs    02/13/19 0452  NA 139  K 3.0*  CL 94*  CO2 31  GLUCOSE 126*  BUN 10  CREATININE 0.54  CALCIUM 9.1    PT/INR: No results for input(s): LABPROT, INR in the last 72 hours. ABG    Component Value Date/Time   PHART 7.321 (L) 02/05/2019 0452   HCO3 28.7 (H) 02/05/2019 0452   O2SAT 89.8 02/05/2019 0452   CBG (last 3)  No results for input(s): GLUCAP in the last 72 hours.  Assessment/Plan: S/P Procedure(s) (LRB): VIDEO ASSISTED THORACOSCOPY (VATS)/RIGHT UPPER AND MIDDLE LOBECTOMY (Right) - CXR shows complete white out of right lung  Atelectasis secondary to mucous plugging. She has been unable to clear- will need to do bronchoscopy today. Informed her of the indications, risks, benefits and alternatives NPO   LOS: 10 days    Melrose Nakayama 02/14/2019

## 2019-02-14 NOTE — Progress Notes (Signed)
10 Days Post-Op Procedure(s) (LRB): VIDEO ASSISTED THORACOSCOPY (VATS)/RIGHT UPPER AND MIDDLE LOBECTOMY (Right) Subjective: No complaints this AM  Objective: Vital signs in last 24 hours: Temp:  [97.9 F (36.6 C)-99.1 F (37.3 C)] 97.9 F (36.6 C) (03/15 0739) Pulse Rate:  [66-100] 71 (03/15 0739) Cardiac Rhythm: Normal sinus rhythm (03/14 2127) Resp:  [23-30] 25 (03/15 0739) BP: (93-119)/(51-72) 109/60 (03/15 0739) SpO2:  [88 %-97 %] 96 % (03/15 0739)  Hemodynamic parameters for last 24 hours:    Intake/Output from previous day: 03/14 0701 - 03/15 0700 In: 120 [P.O.:120] Out: 1000 [Urine:1000] Intake/Output this shift: No intake/output data recorded.  General appearance: alert, cooperative and no distress Neurologic: intact Heart: regular rate and rhythm Lungs: absent BS right Wound: clean and dry  Lab Results: Recent Labs    02/13/19 0452  WBC 11.0*  HGB 9.6*  HCT 30.2*  PLT 410*   BMET:  Recent Labs    02/13/19 0452  NA 139  K 3.0*  CL 94*  CO2 31  GLUCOSE 126*  BUN 10  CREATININE 0.54  CALCIUM 9.1    PT/INR: No results for input(s): LABPROT, INR in the last 72 hours. ABG    Component Value Date/Time   PHART 7.321 (L) 02/05/2019 0452   HCO3 28.7 (H) 02/05/2019 0452   O2SAT 89.8 02/05/2019 0452   CBG (last 3)  No results for input(s): GLUCAP in the last 72 hours.  Assessment/Plan: S/P Procedure(s) (LRB): VIDEO ASSISTED THORACOSCOPY (VATS)/RIGHT UPPER AND MIDDLE LOBECTOMY (Right) - CXR shows complete white out of right lung  Atelectasis secondary to mucous plugging. She has been unable to clear- will need to do bronchoscopy today. Informed her of the indications, risks, benefits and alternatives NPO   LOS: 10 days    Melrose Nakayama 02/14/2019

## 2019-02-14 NOTE — Plan of Care (Addendum)
Patient transferred from bed to chair for 1 hour, with chair alarm activated. Patient tolerated well.     Problem: Education: Goal: Knowledge of General Education information will improve Description Including pain rating scale, medication(s)/side effects and non-pharmacologic comfort measures Outcome: Progressing   Problem: Health Behavior/Discharge Planning: Goal: Ability to manage health-related needs will improve Outcome: Progressing   Problem: Clinical Measurements: Goal: Ability to maintain clinical measurements within normal limits will improve Outcome: Progressing Goal: Will remain free from infection Outcome: Progressing Goal: Diagnostic test results will improve Outcome: Progressing Goal: Respiratory complications will improve Outcome: Progressing Goal: Cardiovascular complication will be avoided Outcome: Progressing   Problem: Activity: Goal: Risk for activity intolerance will decrease Outcome: Progressing   Problem: Nutrition: Goal: Adequate nutrition will be maintained Outcome: Progressing   Problem: Coping: Goal: Level of anxiety will decrease Outcome: Progressing   Problem: Elimination: Goal: Will not experience complications related to bowel motility Outcome: Progressing Goal: Will not experience complications related to urinary retention Outcome: Progressing   Problem: Pain Managment: Goal: General experience of comfort will improve Outcome: Progressing   Problem: Safety: Goal: Ability to remain free from injury will improve Outcome: Progressing   Problem: Skin Integrity: Goal: Risk for impaired skin integrity will decrease Outcome: Progressing

## 2019-02-14 NOTE — Progress Notes (Signed)
Patient is alert and oriented x 4. Resting in bed post bronchoscopy. On 10 liters hi flow with O2 SAT 94-96%. Call bell within reach. Encouraged to report signs and symptoms to healthcare team.

## 2019-02-15 ENCOUNTER — Inpatient Hospital Stay (HOSPITAL_COMMUNITY): Payer: Medicare HMO

## 2019-02-15 ENCOUNTER — Encounter (HOSPITAL_COMMUNITY): Payer: Self-pay | Admitting: Thoracic Surgery (Cardiothoracic Vascular Surgery)

## 2019-02-15 HISTORY — PX: IR THORACENTESIS RIGHT ASP PLEURAL SPACE W/IMG GUIDE: IMG5380

## 2019-02-15 MED ORDER — LIDOCAINE HCL 1 % IJ SOLN
INTRAMUSCULAR | Status: AC
  Filled 2019-02-15: qty 20

## 2019-02-15 MED ORDER — LIDOCAINE HCL (PF) 1 % IJ SOLN
INTRAMUSCULAR | Status: DC | PRN
  Administered 2019-02-15: 10 mL

## 2019-02-15 NOTE — Progress Notes (Signed)
Pt left unit for Ir . Procedure room.

## 2019-02-15 NOTE — Care Management Important Message (Signed)
Important Message  Patient Details  Name: Tonya Bradley MRN: [id number] Date of Birth: Jun 22, 1951   Medicare Important Message Given:  Yes    [name] 02/15/2019, 4:07 PM

## 2019-02-15 NOTE — Procedures (Signed)
PROCEDURE SUMMARY:  Successful image-guided right thoracentesis. Yielded 500 milliliters of serosanguineous fluid. Patient tolerated procedure well. EBL: Zero No immediate complications.  Specimen was sent for labs. Post procedure CXR shows no pneumothorax.  Please see imaging section of Epic for full dictation.  Joaquim Nam PA-C 02/15/2019 4:02 PM

## 2019-02-15 NOTE — Progress Notes (Signed)
Pt returned form cxr dept.

## 2019-02-15 NOTE — Progress Notes (Signed)
Pt left unit accompanied to have ct scan done.

## 2019-02-15 NOTE — Progress Notes (Addendum)
      [address]       [place] [address]             [phone]      1 Day Post-Op Procedure(s) (LRB): BRONCHOSCOPY WITH SEDATION (N/A)   Subjective:  Feels like she is breathing better.  She has been able to cough some sputum up.   Objective: Vital signs in last 24 hours: Temp:  [97.2 F (36.2 C)-97.7 F (36.5 C)] 97.7 F (36.5 C) (03/16 0200) Pulse Rate:  [62-89] 73 (03/16 0200) Cardiac Rhythm: Normal sinus rhythm (03/16 0700) Resp:  [19-41] 19 (03/16 0200) BP: (103-134)/(46-61) 111/60 (03/16 0200) SpO2:  [88 %-97 %] 95 % (03/16 0200)  Intake/Output from previous day: 03/15 0701 - 03/16 0700 In: 640 [P.O.:240; I.V.:400] Out: 500 [Urine:500]  General appearance: alert, cooperative and no distress Heart: regular rate and rhythm Lungs: clear to auscultation bilaterally Abdomen: soft, non-tender; bowel sounds normal; no masses,  no organomegaly Extremities: extremities normal, atraumatic, no cyanosis or edema Wound: clean and dry  Lab Results: Recent Labs    02/13/19 0452  WBC 11.0*  HGB 9.6*  HCT 30.2*  PLT 410*   BMET:  Recent Labs    02/13/19 0452  NA 139  K 3.0*  CL 94*  CO2 31  GLUCOSE 126*  BUN 10  CREATININE 0.54  CALCIUM 9.1    PT/INR: No results for input(s): LABPROT, INR in the last 72 hours. ABG    Component Value Date/Time   PHART 7.321 (L) 02/05/2019 0452   HCO3 28.7 (H) 02/05/2019 0452   O2SAT 89.8 02/05/2019 0452   CBG (last 3)  No results for input(s): GLUCAP in the last 72 hours.  Assessment/Plan: S/P Procedure(s) (LRB): BRONCHOSCOPY WITH SEDATION (N/A)  1. CV- hemodynamically stable in NSR, continue Amiodarone, Cardizem, Lopressor 2. Pulm- CXR remains the same, continued opacification on the right side, per Dr. [name] he will get a CT scan to further evaluate 3. Lovenox for DVT prophylaxis 4. Dispo- patient stable, feels like she is breathing better, CXR with continued opacification on the right,  for CT scan today, continue current care   LOS: 11 days    Ellwood Handler 02/15/2019 Patient seen and examined, agree with above Good cough effort but right lung remains whited out. May just need another bronch but will check a CT to see if there is an effusion component to it  [place] C. [name], MD [facility] [phone]

## 2019-02-15 NOTE — Progress Notes (Signed)
Pt returned to unit.

## 2019-02-16 ENCOUNTER — Inpatient Hospital Stay (HOSPITAL_COMMUNITY): Payer: Medicare HMO

## 2019-02-16 ENCOUNTER — Encounter (HOSPITAL_COMMUNITY): Payer: Self-pay | Admitting: Certified Registered"

## 2019-02-16 LAB — CULTURE, RESPIRATORY W GRAM STAIN

## 2019-02-16 LAB — TRIGLYCERIDES, BODY FLUIDS: Triglycerides, Fluid: 21 mg/dL

## 2019-02-16 NOTE — Progress Notes (Addendum)
      [address]       [place] [address]             [phone]      2 Days Post-Op Procedure(s) (LRB): BRONCHOSCOPY WITH SEDATION (N/A)   Subjective:  Feels like she is breathing better.  Continues to cough up sputum, which is getting easier.  + ambulation  + BM  Objective: Vital signs in last 24 hours: Temp:  [97.6 F (36.4 C)-98.4 F (36.9 C)] 97.9 F (36.6 C) (03/17 0805) Pulse Rate:  [65-91] 65 (03/17 0500) Cardiac Rhythm: Normal sinus rhythm (03/17 0500) Resp:  [19-27] 24 (03/17 0805) BP: (97-104)/(46-74) 100/58 (03/17 0805) SpO2:  [93 %-98 %] 97 % (03/17 0747)  General appearance: alert, cooperative and no distress Heart: regular rate and rhythm Lungs: clear to auscultation bilaterally and mildly diminished right base Abdomen: soft, non-tender; bowel sounds normal; no masses,  no organomegaly Extremities: extremities normal, atraumatic, no cyanosis or edema Wound: clean and dry  Lab Results: No results for input(s): WBC, HGB, HCT, PLT in the last 72 hours. BMET: No results for input(s): NA, K, CL, CO2, GLUCOSE, BUN, CREATININE, CALCIUM in the last 72 hours.  PT/INR: No results for input(s): LABPROT, INR in the last 72 hours. ABG    Component Value Date/Time   PHART 7.321 (L) 02/05/2019 0452   HCO3 28.7 (H) 02/05/2019 0452   O2SAT 89.8 02/05/2019 0452   CBG (last 3)  No results for input(s): GLUCAP in the last 72 hours.  Assessment/Plan: S/P Procedure(s) (LRB): BRONCHOSCOPY WITH SEDATION (N/A)  1. CV- maintaining NSR- continue Amiodarone, Cardizem, Lopressor 2. Pulm- S/P Thoracentesis 500 cc drained, sent for Triglycerides which remain pending, moving more air on the right side, CXR is ordered, will review once completed, continue aggressive pulmonary toilet, patient on oxygen at home 3. Lovenox for DVT prophylaxis 4. Dispo- patient stable, feels like she is breathing better, will review CXR results once completed, continue aggressive  pulmonary toilet, maintaining NSR   LOS: 12 days    Ellwood Handler 02/16/2019 Patient seen and examined, agree with above Still diminished on right- CXR shows atelectasis with "cut off" bronchus- will plan to bronch again tomorrow if still obstructed  Remo Lipps C. [name], MD [facility] [phone]

## 2019-02-16 NOTE — Plan of Care (Signed)

## 2019-02-16 NOTE — Progress Notes (Signed)
Patient continues on 5L  HFNC . No distress noted. BIPAP remains PRN.

## 2019-02-17 ENCOUNTER — Inpatient Hospital Stay (HOSPITAL_COMMUNITY): Payer: Medicare HMO

## 2019-02-17 ENCOUNTER — Ambulatory Visit: Payer: Medicare HMO | Admitting: Family Medicine

## 2019-02-17 ENCOUNTER — Encounter (HOSPITAL_COMMUNITY)
Admission: RE | Disposition: A | Discharge status: Home Health | Payer: Self-pay | Source: Home / Self Care | Attending: Thoracic Surgery (Cardiothoracic Vascular Surgery)

## 2019-02-17 SURGERY — BRONCHOSCOPY, VIDEO-ASSISTED
Anesthesia: Choice

## 2019-02-17 MED ORDER — ROCURONIUM BROMIDE 50 MG/5ML IV SOSY
PREFILLED_SYRINGE | INTRAVENOUS | Status: AC
  Filled 2019-02-17: qty 5

## 2019-02-17 MED ORDER — PROPOFOL 10 MG/ML IV BOLUS
INTRAVENOUS | Status: AC
  Filled 2019-02-17: qty 20

## 2019-02-17 MED ORDER — LIDOCAINE 2% (20 MG/ML) 5 ML SYRINGE
INTRAMUSCULAR | Status: AC
  Filled 2019-02-17: qty 5

## 2019-02-17 MED ORDER — FENTANYL CITRATE (PF) 250 MCG/5ML IJ SOLN
INTRAMUSCULAR | Status: AC
  Filled 2019-02-17: qty 5

## 2019-02-17 MED ORDER — MIDAZOLAM HCL 2 MG/2ML IJ SOLN
INTRAMUSCULAR | Status: AC
  Filled 2019-02-17: qty 2

## 2019-02-17 MED ORDER — ACETAMINOPHEN 500 MG PO TABS
1000.0000 mg | ORAL_TABLET | Freq: Once | ORAL | Status: AC
  Administered 2019-02-17: 1000 mg via ORAL
  Filled 2019-02-17: qty 2

## 2019-02-17 MED ORDER — ONDANSETRON HCL 4 MG/2ML IJ SOLN
INTRAMUSCULAR | Status: AC
  Filled 2019-02-17: qty 2

## 2019-02-17 NOTE — TOC Progression Note (Signed)
Transition of Care [facility]) - Progression Note    Patient Details  Name: Lamyiah Crawshaw MRN: [id number] Date of Birth: [birth year]  [facility] of [facility]) CM/SW Contact  Maryclare Labrador, RN Phone Number: 02/17/2019, 10:50 AM  Clinical Narrative:   CM informed by bedside that pt required assistance with mobility during last ambulation - prior pt was independent with ambulation per progression discussions.  CM requested bedside nurse to request PT eval if necessary in preparation for pt to discharge.      Expected Discharge Plan: [place]    Expected Discharge Plan and Services Expected Discharge Plan: [place] Discharge Planning Services: CM Consult   Living arrangements for the past 2 months: Single Family Home                           Social Determinants of Health (SDOH) Interventions    Readmission Risk Interventions 30 Day Unplanned Readmission Risk Score     Admission (Current) from 02/04/2019 in [place]  30 Day Unplanned Readmission Risk Score (%)  24 Filed at 02/17/2019 0801     This score is the patient's risk of an unplanned readmission within 30 days of being discharged (0 -100%). The score is based on dignosis, age, lab data, medications, orders, and past utilization.   Low:  0-14.9   Medium: 15-21.9   High: 22-29.9   Extreme: 30 and above       Readmission Risk Prevention Plan 02/09/2019  Transportation Screening Complete  HRI or Home Care Consult Complete  Palliative Care Screening Not Applicable  Some recent data might be hidden

## 2019-02-17 NOTE — Progress Notes (Addendum)
      [address]       [place] [address]             [phone]      3 Days Post-Op Procedure(s) (LRB): BRONCHOSCOPY WITH SEDATION (N/A)   Subjective:  No new complaints.  Coughed up a large amount of sputum yesterday.  Feels like she is breathing better.  Objective: Vital signs in last 24 hours: Temp:  [97.6 F (36.4 C)-98.5 F (36.9 C)] 97.8 F (36.6 C) (03/18 0746) Pulse Rate:  [60-71] 62 (03/18 0518) Cardiac Rhythm: Normal sinus rhythm (03/18 0746) Resp:  [20-27] 21 (03/18 0518) BP: (100-109)/(47-87) 107/65 (03/18 0518) SpO2:  [91 %-98 %] 94 % (03/18 0518)  Intake/Output from previous day: 03/17 0701 - 03/18 0700 In: 120 [P.O.:120] Out: 300 [Urine:300]  General appearance: alert, cooperative and no distress Heart: regular rate and rhythm Lungs: wheezes bilaterally Abdomen: soft, non-tender; bowel sounds normal; no masses,  no organomegaly Extremities: extremities normal, atraumatic, no cyanosis or edema Wound: clean and dry  Lab Results: No results for input(s): WBC, HGB, HCT, PLT in the last 72 hours. BMET: No results for input(s): NA, K, CL, CO2, GLUCOSE, BUN, CREATININE, CALCIUM in the last 72 hours.  PT/INR: No results for input(s): LABPROT, INR in the last 72 hours. ABG    Component Value Date/Time   PHART 7.321 (L) 02/05/2019 0452   HCO3 28.7 (H) 02/05/2019 0452   O2SAT 89.8 02/05/2019 0452   CBG (last 3)  No results for input(s): GLUCAP in the last 72 hours.  Assessment/Plan: S/P Procedure(s) (LRB): BRONCHOSCOPY WITH SEDATION (N/A)  1. CV- maintaining NSR- continue Amiodarone, Cardizem, Lopressor 2. Pulm- significant improvement of opacification of right lung, persistent right pleural effusion, continue nebs, aggressive IS 3. Lovenox for DVT prophylaxis 4. Dispo- significant improvement in right side opacification, continue aggressive pulmonary toilet, patient on oxygen at home... if remains clinically stable possibly can d/c  in next 24-48 hours   LOS: 13 days    Ellwood Handler 02/17/2019 Patient seen and examined, agree with above Will cancel [place] tomorrow if lung remains open  Remo Lipps C. [name], MD [facility] [phone]

## 2019-02-18 ENCOUNTER — Inpatient Hospital Stay (HOSPITAL_COMMUNITY): Payer: Medicare HMO

## 2019-02-18 DIAGNOSIS — C3491 Malignant neoplasm of unspecified part of right bronchus or lung: Secondary | ICD-10-CM

## 2019-02-18 MED ORDER — METOPROLOL TARTRATE 25 MG PO TABS: 25.0000 mg | ORAL_TABLET | Freq: Two times a day (BID) | ORAL | 3 refills | Status: DC

## 2019-02-18 MED ORDER — OXYCODONE HCL 5 MG PO TABS: 5.0000 mg | ORAL_TABLET | ORAL | 0 refills | Status: DC | PRN

## 2019-02-18 MED ORDER — GUAIFENESIN ER 600 MG PO TB12: 1200.0000 mg | ORAL_TABLET | Freq: Two times a day (BID) | ORAL | 1 refills | Status: DC

## 2019-02-18 NOTE — Care Management Important Message (Signed)
Important Message  Patient Details  Name: Tonya Bradley MRN: [id number] Date of Birth: 10/09/1951   Medicare Important Message Given:  Yes    Orbie Pyo 02/18/2019, 12:21 PM

## 2019-02-18 NOTE — Consult Note (Signed)
   [facility] CM Inpatient Consult   02/18/2019  Arine Foley Jul 30, 1951 [id number]   Patient screened for high risk score of 28% for unplanned readmission and hospitalization to check if potential [place] [facility] services are needed with her [facility] plan.    Patient presented for surgery, with the diagnosis of right upper lung nodule, underwent VATS- Video Assisted Thoracoscopy, Right Upper Lobectomy.    Per Physician Assistant's history and physical note on 02/08/19, patient is a 68 y/o female with known history of tobacco abuse, COPD, oxygen use at night, A. Flutter S/p ablation, A. Fibrillation, CAD S/p PCI, hyperlipidemia, reflux, depression, anxiety, anemia, and arthritis. She smoked about1.5packs a day for 50 years and quit smoking about a week ago after starting Chantix. She uses home oxygen at night on 3 Liters.   Patient's primary care provider is Dr. Betty Martinique with [facility].   Per Inpatient Transition of care CM note, patient required assistance with mobility during last ambulation but prior to admission, patient was completely independent from home with her husband and was able to drive and if needed, patient's husband is available and can transport her. Patient uses [facility] representative in [place] and [facility] Order Delivery service for her medications.  Anticipated discharge disposition is home with home health services [facility]) per PT recommendation. and patient had voiced concerns of continued issues with her lungs, per transition of care CM note.   Will place referral for EMMI COPD calls to monitor patient's recovery at home.    Addendum:  Was able to talk to Inpatient transition of care CM over the phone regarding patient's further needs going home but has indicated that patient has no other needs presented so far.   For questions and additional information, please contact:  Mateya Torti A. Mayar Whittier, BSN, RN-BC [facility] Liaison Cell: [phone]

## 2019-02-18 NOTE — Progress Notes (Addendum)
      [address]       [place] [address]             [phone]      4 Days Post-Op Procedure(s) (LRB): BRONCHOSCOPY WITH SEDATION (N/A)   Subjective:  No complaints.  Feels like she is breathing much better. + ambulation...  Patient given education on continued IS/flutter valve use at discharge.  Instructed on importance of ambulation. Also educated on importance of staying inside/avoiding public places due to underlying lung disease with current public health crisis.  Objective: Vital signs in last 24 hours: Temp:  [97.7 F (36.5 C)-98.7 F (37.1 C)] 98.7 F (37.1 C) (03/19 0349) Pulse Rate:  [63-78] 69 (03/19 0349) Cardiac Rhythm: Normal sinus rhythm (03/19 0300) Resp:  [20-26] 23 (03/19 0349) BP: (97-123)/(51-69) 119/66 (03/19 0349) SpO2:  [95 %-98 %] 98 % (03/19 0349)  Intake/Output from previous day: 03/18 0701 - 03/19 0700 In: 360 [P.O.:360] Out: -   General appearance: alert, cooperative and no distress Heart: regular rate and rhythm Lungs: clear to auscultation bilaterally Abdomen: soft, non-tender; bowel sounds normal; no masses,  no organomegaly Extremities: extremities normal, atraumatic, no cyanosis or edema Wound: clean and dry  Lab Results: No results for input(s): WBC, HGB, HCT, PLT in the last 72 hours. BMET: No results for input(s): NA, K, CL, CO2, GLUCOSE, BUN, CREATININE, CALCIUM in the last 72 hours.  PT/INR: No results for input(s): LABPROT, INR in the last 72 hours. ABG    Component Value Date/Time   PHART 7.321 (L) 02/05/2019 0452   HCO3 28.7 (H) 02/05/2019 0452   O2SAT 89.8 02/05/2019 0452   CBG (last 3)  No results for input(s): GLUCAP in the last 72 hours.  Assessment/Plan: S/P Procedure(s) (LRB): BRONCHOSCOPY WITH SEDATION (N/A)  1. CV- hemodynamically stable in NSR, continue Amiodarone, Cardizem, Lopressor 2. Pulm- CXR remains stable, on  Baseline regimen of oxygen, continue aggressive IS use at discharge 3.  ID- afebrile,  No need for ABX at discharge 4. Dispo- patient is stable, lung remains open on CXR, will d/c home today   LOS: 14 days    Tonya Bradley 02/18/2019 Patient seen and examined, agree with above Home today

## 2019-02-18 NOTE — Discharge Instructions (Signed)
Discharge Instructions:  1. You may shower, please wash incisions daily with soap and water and keep dry.  If you wish to cover wounds with dressing you may do so but please keep clean and change daily.  No tub baths or swimming until incisions have completely healed.  If your incisions become red or develop any drainage please call our office at [phone]  2. No Driving until cleared by Dr. Leonarda Salon office and you are no longer using narcotic pain medications  3. Fever of 101.5 for at least 24 hours with no source, please contact our office at [phone]  4. Activity- up as tolerated, please walk at least 3 times per day.  Avoid strenuous activity 5. If any questions or concerns arise, please do not hesitate to contact our office at [phone]

## 2019-02-18 NOTE — Evaluation (Signed)
Physical Therapy Evaluation Patient Details Name: Tonya Bradley MRN: [id number] DOB: Jun 20, 1951 Today's Date: 02/18/2019   History of Present Illness  68 yo woman with a history of tobacco abuse, COPD, home O2 at night, atrial flutter status post ablation, atrial fibrillation, coronary artery disease status post stent in 2000, hyperlipidemia, reflux, osteoporosis, depression, anxiety, anemia, and arthritis.  She started in the lung cancer screening program and May 2018.  Her scan at that time was unremarkable.  She recently had a repeat CT which showed bilobed right upper lobe pulmonary nodule.     Clinical Impression  PT eval complete. Pt demonstrated modified independence with bed mobility. Supervision provided for transfers and min guard assist ambulation 200 feet without AD. Pt on 2 L O2 throughout session. SpO2 99% at rest and desat to 91% during mobility. Pt to discharge home with spouse today. All further needs to be addressed by HHPT. PT signing off.    Follow Up Recommendations Home health PT;Supervision for mobility/OOB    Equipment Recommendations  None recommended by PT    Recommendations for Other Services       Precautions / Restrictions Precautions Precautions: Other (comment) Precaution Comments: watch sats Restrictions Weight Bearing Restrictions: No      Mobility  Bed Mobility Overal bed mobility: Modified Independent                Transfers Overall transfer level: Needs assistance Equipment used: None Transfers: Sit to/from Stand;Stand Pivot Transfers Sit to Stand: Supervision Stand pivot transfers: Supervision          Ambulation/Gait Ambulation/Gait assistance: Min guard Gait Distance (Feet): 200 Feet Assistive device: None Gait Pattern/deviations: Step-through pattern;Decreased stride length Gait velocity: WFL Gait velocity interpretation: 1.31 - 2.62 ft/sec, indicative of limited community ambulator General Gait Details: Ambulated on 2  L O2 with desat to 91%. SpO2 99% at rest on 2 L.  Stairs            Wheelchair Mobility    Modified Rankin (Stroke Patients Only)       Balance Overall balance assessment: Mild deficits observed, not formally tested                                           Pertinent Vitals/Pain Pain Assessment: Faces Faces Pain Scale: Hurts little more Pain Location: R flank Pain Descriptors / Indicators: Discomfort Pain Intervention(s): Monitored during session    Home Living Family/patient expects to be discharged to:: Private residence Living Arrangements: Spouse/significant other Available Help at Discharge: Family;Available 24 hours/day Type of Home: Other(Comment)(townhouse) Home Access: Level entry     Home Layout: One level Home Equipment: Walker - 2 wheels;Other (comment) Additional Comments: home O2    Prior Function Level of Independence: Independent         Comments: home O2 2 L     Hand Dominance        Extremity/Trunk Assessment   Upper Extremity Assessment Upper Extremity Assessment: Overall WFL for tasks assessed    Lower Extremity Assessment Lower Extremity Assessment: Overall WFL for tasks assessed    Cervical / Trunk Assessment Cervical / Trunk Assessment: Normal  Communication   Communication: No difficulties  Cognition Arousal/Alertness: Awake/alert Behavior During Therapy: WFL for tasks assessed/performed Overall Cognitive Status: Within Functional Limits for tasks assessed  General Comments      Exercises     Assessment/Plan    PT Assessment All further PT needs can be met in the next venue of care  PT Problem List Decreased balance;Pain;Decreased mobility;Cardiopulmonary status limiting activity;Decreased activity tolerance       PT Treatment Interventions      PT Goals (Current goals can be found in the Care Plan section)  Acute Rehab PT  Goals Patient Stated Goal: home today PT Goal Formulation: All assessment and education complete, DC therapy    Frequency     Barriers to discharge        Co-evaluation               AM-PAC PT "6 Clicks" Mobility  Outcome Measure Help needed turning from your back to your side while in a flat bed without using bedrails?: None Help needed moving from lying on your back to sitting on the side of a flat bed without using bedrails?: A Little Help needed moving to and from a bed to a chair (including a wheelchair)?: A Little Help needed standing up from a chair using your arms (e.g., wheelchair or bedside chair)?: A Little Help needed to walk in hospital room?: A Little Help needed climbing 3-5 steps with a railing? : A Little 6 Click Score: 19    End of Session Equipment Utilized During Treatment: Gait belt;Oxygen Activity Tolerance: Patient tolerated treatment well Patient left: in bed;with call bell/phone within reach Nurse Communication: Mobility status PT Visit Diagnosis: Unsteadiness on feet (R26.81);Difficulty in walking, not elsewhere classified (R26.2)    Time: 1165-7903 PT Time Calculation (min) (ACUTE ONLY): 16 min   Charges:   PT Evaluation $PT Eval Low Complexity: 1 Low          Lorrin Goodell, PT  Office # [phone] Pager [phone]   Lorriane Shire 02/18/2019, 10:22 AM

## 2019-02-18 NOTE — TOC Transition Note (Signed)
Transition of Care [facility]) - CM/SW Discharge Note   Patient Details  Name: Christon Gallaway MRN: [id number] Date of Birth: Apr 28, 1951  [facility] CM/SW Contact:  Maryclare Labrador, RN Phone Number: 02/18/2019, 8:40 AM   Clinical Narrative:  Pt to discharge home today, husband will transport pt home.  Unit secretary will make PCP follow up appt.  CM provided medicare.gov HH list choice to pt via phone - pt chose [facility].  Agency contacted and referral accepted.       Final next level of care: Home w Home Health Services Barriers to Discharge: No Barriers Identified   Patient Goals and CMS Choice Patient states their goals for this hospitalization and ongoing recovery are:: (Pt states she feels good and is confident that she will be fine, she wants to get back to her life) CMS Medicare.gov Compare Post Acute Care list provided to:: Patient Choice offered to / list presented to : Patient  Discharge Placement Home with Home Health;  RN and PT via [facility]                       Discharge Plan and Services Discharge Planning Services: CM Consult                      Social Determinants of Health (SDOH) Interventions     Readmission Risk Interventions Readmission Risk Prevention Plan 02/09/2019  Transportation Screening Complete  HRI or Home Care Consult Complete  Palliative Care Screening Not Applicable  Some recent data might be hidden

## 2019-02-19 ENCOUNTER — Other Ambulatory Visit: Payer: Self-pay | Admitting: Family Medicine

## 2019-02-19 DIAGNOSIS — K219 Gastro-esophageal reflux disease without esophagitis: Secondary | ICD-10-CM

## 2019-02-20 DIAGNOSIS — G8929 Other chronic pain: Secondary | ICD-10-CM | POA: Diagnosis not present

## 2019-02-20 DIAGNOSIS — M48061 Spinal stenosis, lumbar region without neurogenic claudication: Secondary | ICD-10-CM | POA: Diagnosis not present

## 2019-02-20 DIAGNOSIS — J438 Other emphysema: Secondary | ICD-10-CM | POA: Diagnosis not present

## 2019-02-20 DIAGNOSIS — C3491 Malignant neoplasm of unspecified part of right bronchus or lung: Secondary | ICD-10-CM | POA: Diagnosis not present

## 2019-02-20 DIAGNOSIS — Z483 Aftercare following surgery for neoplasm: Secondary | ICD-10-CM | POA: Diagnosis not present

## 2019-02-20 DIAGNOSIS — J9611 Chronic respiratory failure with hypoxia: Secondary | ICD-10-CM | POA: Diagnosis not present

## 2019-02-20 DIAGNOSIS — I4892 Unspecified atrial flutter: Secondary | ICD-10-CM | POA: Diagnosis not present

## 2019-02-20 DIAGNOSIS — I2510 Atherosclerotic heart disease of native coronary artery without angina pectoris: Secondary | ICD-10-CM | POA: Diagnosis not present

## 2019-02-20 DIAGNOSIS — I83893 Varicose veins of bilateral lower extremities with other complications: Secondary | ICD-10-CM | POA: Diagnosis not present

## 2019-02-21 DIAGNOSIS — I4892 Unspecified atrial flutter: Secondary | ICD-10-CM | POA: Diagnosis not present

## 2019-02-21 DIAGNOSIS — C3491 Malignant neoplasm of unspecified part of right bronchus or lung: Secondary | ICD-10-CM | POA: Diagnosis not present

## 2019-02-21 DIAGNOSIS — G8929 Other chronic pain: Secondary | ICD-10-CM | POA: Diagnosis not present

## 2019-02-21 DIAGNOSIS — I83893 Varicose veins of bilateral lower extremities with other complications: Secondary | ICD-10-CM | POA: Diagnosis not present

## 2019-02-21 DIAGNOSIS — Z483 Aftercare following surgery for neoplasm: Secondary | ICD-10-CM | POA: Diagnosis not present

## 2019-02-21 DIAGNOSIS — M48061 Spinal stenosis, lumbar region without neurogenic claudication: Secondary | ICD-10-CM | POA: Diagnosis not present

## 2019-02-21 DIAGNOSIS — I2510 Atherosclerotic heart disease of native coronary artery without angina pectoris: Secondary | ICD-10-CM | POA: Diagnosis not present

## 2019-02-21 DIAGNOSIS — J438 Other emphysema: Secondary | ICD-10-CM | POA: Diagnosis not present

## 2019-02-21 DIAGNOSIS — J9611 Chronic respiratory failure with hypoxia: Secondary | ICD-10-CM | POA: Diagnosis not present

## 2019-02-22 ENCOUNTER — Telehealth: Payer: Self-pay | Admitting: Family Medicine

## 2019-02-22 NOTE — Telephone Encounter (Unsigned)
Copied from [place] [id number]. Topic: Quick Communication - Home Health Verbal Orders >> Feb 22, 2019  1:17 PM Valla Leaver wrote: Caller/Agency: Belenda Cruise, RN w/ [facility] Callback Number: [phone] Requesting OT/PT/Skilled Nursing/Social Work/Speech Therapy: PT and skilled nursing Frequency: SN 1wk 1, 2wk 3, 1wk 2, PT 2WK 2, 1WK 1

## 2019-02-23 NOTE — Telephone Encounter (Signed)
Verbal orders given to Belenda Cruise, Therapist, sports as requested.

## 2019-02-24 ENCOUNTER — Telehealth: Payer: Self-pay | Admitting: Physician Assistant

## 2019-02-24 DIAGNOSIS — J438 Other emphysema: Secondary | ICD-10-CM | POA: Diagnosis not present

## 2019-02-24 DIAGNOSIS — G8929 Other chronic pain: Secondary | ICD-10-CM | POA: Diagnosis not present

## 2019-02-24 DIAGNOSIS — I4892 Unspecified atrial flutter: Secondary | ICD-10-CM | POA: Diagnosis not present

## 2019-02-24 DIAGNOSIS — C3491 Malignant neoplasm of unspecified part of right bronchus or lung: Secondary | ICD-10-CM | POA: Diagnosis not present

## 2019-02-24 DIAGNOSIS — M48061 Spinal stenosis, lumbar region without neurogenic claudication: Secondary | ICD-10-CM | POA: Diagnosis not present

## 2019-02-24 DIAGNOSIS — Z483 Aftercare following surgery for neoplasm: Secondary | ICD-10-CM | POA: Diagnosis not present

## 2019-02-24 DIAGNOSIS — J9611 Chronic respiratory failure with hypoxia: Secondary | ICD-10-CM | POA: Diagnosis not present

## 2019-02-24 DIAGNOSIS — I2510 Atherosclerotic heart disease of native coronary artery without angina pectoris: Secondary | ICD-10-CM | POA: Diagnosis not present

## 2019-02-24 DIAGNOSIS — I83893 Varicose veins of bilateral lower extremities with other complications: Secondary | ICD-10-CM | POA: Diagnosis not present

## 2019-02-24 NOTE — Telephone Encounter (Signed)
      [address]       [place] [address]             [place] [phone]   S/P Right VATs with wedge resection right upper lobe nodule, right upper and  Middle lobectomy, lymph node dissection, and intercostal nerve block  performed on 02/04/2019. Hospital stay complicated by thickened secretions and subsequent mucous plugging requiring Bronchoscopy with removal of secretions.  Discharged home on 02/18/2019.  Medications: No changes, taking regularly.  She is only taking pain medication when she really needs it maybe 1-2 times per day.  Problems/Concerns:  She states that she is having episodes of being Hot and sweating constantly.  She denies having a fever and states the home health nurse has been checking her vitals and they have been okay.  She states that if she thinks about it, these episodes may have been present prior to surgery.  Assessment:  Patient is doing well.  She is up ambulating several times per day.  She states she was even able to perform some light house work.  She is no longer coughing, or having significant sputum production.  Her incisions are healing without evidence of infection.  The patient was instructed to continue ambulating as able.  Home health nurse is due to evaluate patient today.    Follow up Appointment: She is scheduled to follow up with Dr. [name] on 03/09/2019

## 2019-02-26 DIAGNOSIS — I83893 Varicose veins of bilateral lower extremities with other complications: Secondary | ICD-10-CM | POA: Diagnosis not present

## 2019-02-26 DIAGNOSIS — Z483 Aftercare following surgery for neoplasm: Secondary | ICD-10-CM | POA: Diagnosis not present

## 2019-02-26 DIAGNOSIS — J438 Other emphysema: Secondary | ICD-10-CM | POA: Diagnosis not present

## 2019-02-26 DIAGNOSIS — G8929 Other chronic pain: Secondary | ICD-10-CM | POA: Diagnosis not present

## 2019-02-26 DIAGNOSIS — M48061 Spinal stenosis, lumbar region without neurogenic claudication: Secondary | ICD-10-CM | POA: Diagnosis not present

## 2019-02-26 DIAGNOSIS — J9611 Chronic respiratory failure with hypoxia: Secondary | ICD-10-CM | POA: Diagnosis not present

## 2019-02-26 DIAGNOSIS — I4892 Unspecified atrial flutter: Secondary | ICD-10-CM | POA: Diagnosis not present

## 2019-02-26 DIAGNOSIS — I2510 Atherosclerotic heart disease of native coronary artery without angina pectoris: Secondary | ICD-10-CM | POA: Diagnosis not present

## 2019-02-26 DIAGNOSIS — C3491 Malignant neoplasm of unspecified part of right bronchus or lung: Secondary | ICD-10-CM | POA: Diagnosis not present

## 2019-02-27 DIAGNOSIS — I83893 Varicose veins of bilateral lower extremities with other complications: Secondary | ICD-10-CM | POA: Diagnosis not present

## 2019-02-27 DIAGNOSIS — J9611 Chronic respiratory failure with hypoxia: Secondary | ICD-10-CM | POA: Diagnosis not present

## 2019-02-27 DIAGNOSIS — M48061 Spinal stenosis, lumbar region without neurogenic claudication: Secondary | ICD-10-CM | POA: Diagnosis not present

## 2019-02-27 DIAGNOSIS — J438 Other emphysema: Secondary | ICD-10-CM | POA: Diagnosis not present

## 2019-02-27 DIAGNOSIS — G8929 Other chronic pain: Secondary | ICD-10-CM | POA: Diagnosis not present

## 2019-02-27 DIAGNOSIS — C3491 Malignant neoplasm of unspecified part of right bronchus or lung: Secondary | ICD-10-CM | POA: Diagnosis not present

## 2019-02-27 DIAGNOSIS — I2510 Atherosclerotic heart disease of native coronary artery without angina pectoris: Secondary | ICD-10-CM | POA: Diagnosis not present

## 2019-02-27 DIAGNOSIS — Z483 Aftercare following surgery for neoplasm: Secondary | ICD-10-CM | POA: Diagnosis not present

## 2019-02-27 DIAGNOSIS — I4892 Unspecified atrial flutter: Secondary | ICD-10-CM | POA: Diagnosis not present

## 2019-03-02 DIAGNOSIS — J9611 Chronic respiratory failure with hypoxia: Secondary | ICD-10-CM | POA: Diagnosis not present

## 2019-03-02 DIAGNOSIS — J438 Other emphysema: Secondary | ICD-10-CM | POA: Diagnosis not present

## 2019-03-02 DIAGNOSIS — I4892 Unspecified atrial flutter: Secondary | ICD-10-CM | POA: Diagnosis not present

## 2019-03-02 DIAGNOSIS — C3491 Malignant neoplasm of unspecified part of right bronchus or lung: Secondary | ICD-10-CM | POA: Diagnosis not present

## 2019-03-02 DIAGNOSIS — Z483 Aftercare following surgery for neoplasm: Secondary | ICD-10-CM | POA: Diagnosis not present

## 2019-03-02 DIAGNOSIS — M48061 Spinal stenosis, lumbar region without neurogenic claudication: Secondary | ICD-10-CM | POA: Diagnosis not present

## 2019-03-02 DIAGNOSIS — G8929 Other chronic pain: Secondary | ICD-10-CM | POA: Diagnosis not present

## 2019-03-02 DIAGNOSIS — I2510 Atherosclerotic heart disease of native coronary artery without angina pectoris: Secondary | ICD-10-CM | POA: Diagnosis not present

## 2019-03-02 DIAGNOSIS — I83893 Varicose veins of bilateral lower extremities with other complications: Secondary | ICD-10-CM | POA: Diagnosis not present

## 2019-03-04 DIAGNOSIS — I4892 Unspecified atrial flutter: Secondary | ICD-10-CM | POA: Diagnosis not present

## 2019-03-04 DIAGNOSIS — M48061 Spinal stenosis, lumbar region without neurogenic claudication: Secondary | ICD-10-CM | POA: Diagnosis not present

## 2019-03-04 DIAGNOSIS — G8929 Other chronic pain: Secondary | ICD-10-CM | POA: Diagnosis not present

## 2019-03-04 DIAGNOSIS — I2510 Atherosclerotic heart disease of native coronary artery without angina pectoris: Secondary | ICD-10-CM | POA: Diagnosis not present

## 2019-03-04 DIAGNOSIS — C3491 Malignant neoplasm of unspecified part of right bronchus or lung: Secondary | ICD-10-CM | POA: Diagnosis not present

## 2019-03-04 DIAGNOSIS — J9611 Chronic respiratory failure with hypoxia: Secondary | ICD-10-CM | POA: Diagnosis not present

## 2019-03-04 DIAGNOSIS — J438 Other emphysema: Secondary | ICD-10-CM | POA: Diagnosis not present

## 2019-03-04 DIAGNOSIS — Z483 Aftercare following surgery for neoplasm: Secondary | ICD-10-CM | POA: Diagnosis not present

## 2019-03-04 DIAGNOSIS — I83893 Varicose veins of bilateral lower extremities with other complications: Secondary | ICD-10-CM | POA: Diagnosis not present

## 2019-03-05 DIAGNOSIS — Z483 Aftercare following surgery for neoplasm: Secondary | ICD-10-CM | POA: Diagnosis not present

## 2019-03-05 DIAGNOSIS — G8929 Other chronic pain: Secondary | ICD-10-CM | POA: Diagnosis not present

## 2019-03-05 DIAGNOSIS — J438 Other emphysema: Secondary | ICD-10-CM | POA: Diagnosis not present

## 2019-03-05 DIAGNOSIS — J9611 Chronic respiratory failure with hypoxia: Secondary | ICD-10-CM | POA: Diagnosis not present

## 2019-03-05 DIAGNOSIS — I4892 Unspecified atrial flutter: Secondary | ICD-10-CM | POA: Diagnosis not present

## 2019-03-05 DIAGNOSIS — I2510 Atherosclerotic heart disease of native coronary artery without angina pectoris: Secondary | ICD-10-CM | POA: Diagnosis not present

## 2019-03-05 DIAGNOSIS — M48061 Spinal stenosis, lumbar region without neurogenic claudication: Secondary | ICD-10-CM | POA: Diagnosis not present

## 2019-03-05 DIAGNOSIS — C3491 Malignant neoplasm of unspecified part of right bronchus or lung: Secondary | ICD-10-CM | POA: Diagnosis not present

## 2019-03-05 DIAGNOSIS — I83893 Varicose veins of bilateral lower extremities with other complications: Secondary | ICD-10-CM | POA: Diagnosis not present

## 2019-03-07 ENCOUNTER — Other Ambulatory Visit: Payer: Self-pay | Admitting: Family Medicine

## 2019-03-07 DIAGNOSIS — R110 Nausea: Secondary | ICD-10-CM

## 2019-03-08 ENCOUNTER — Telehealth: Payer: Self-pay | Admitting: Family Medicine

## 2019-03-08 DIAGNOSIS — Z483 Aftercare following surgery for neoplasm: Secondary | ICD-10-CM | POA: Diagnosis not present

## 2019-03-08 DIAGNOSIS — I2510 Atherosclerotic heart disease of native coronary artery without angina pectoris: Secondary | ICD-10-CM | POA: Diagnosis not present

## 2019-03-08 DIAGNOSIS — M48061 Spinal stenosis, lumbar region without neurogenic claudication: Secondary | ICD-10-CM | POA: Diagnosis not present

## 2019-03-08 DIAGNOSIS — G8929 Other chronic pain: Secondary | ICD-10-CM | POA: Diagnosis not present

## 2019-03-08 DIAGNOSIS — J438 Other emphysema: Secondary | ICD-10-CM | POA: Diagnosis not present

## 2019-03-08 DIAGNOSIS — I83893 Varicose veins of bilateral lower extremities with other complications: Secondary | ICD-10-CM | POA: Diagnosis not present

## 2019-03-08 DIAGNOSIS — J9611 Chronic respiratory failure with hypoxia: Secondary | ICD-10-CM | POA: Diagnosis not present

## 2019-03-08 DIAGNOSIS — J449 Chronic obstructive pulmonary disease, unspecified: Secondary | ICD-10-CM | POA: Diagnosis not present

## 2019-03-08 DIAGNOSIS — I4892 Unspecified atrial flutter: Secondary | ICD-10-CM | POA: Diagnosis not present

## 2019-03-08 DIAGNOSIS — C3491 Malignant neoplasm of unspecified part of right bronchus or lung: Secondary | ICD-10-CM | POA: Diagnosis not present

## 2019-03-08 NOTE — Telephone Encounter (Signed)
Copied from [place] [id number]. Topic: Quick Communication - See Telephone Encounter >> Mar 08, 2019  2:08 PM Rutherford Nail, [place] wrote: CRM for notification. See Telephone encounter for: 03/08/19. [place] with [facility] calling and states that she would like a call back from Dr Martinique or her assistant. States patient had overall weakness,  patient is not eating and the patient's oxygen level drops very quickly. States that the patient does have a chest xray tomorrow due to follow up with surgeon. Please advise. CB#: [phone]

## 2019-03-09 ENCOUNTER — Other Ambulatory Visit: Payer: Self-pay

## 2019-03-09 ENCOUNTER — Other Ambulatory Visit: Payer: Self-pay | Admitting: Family Medicine

## 2019-03-09 ENCOUNTER — Other Ambulatory Visit: Payer: Self-pay | Admitting: Thoracic Surgery (Cardiothoracic Vascular Surgery)

## 2019-03-09 ENCOUNTER — Ambulatory Visit: Payer: Medicare HMO | Admitting: Thoracic Surgery (Cardiothoracic Vascular Surgery)

## 2019-03-09 ENCOUNTER — Telehealth (INDEPENDENT_AMBULATORY_CARE_PROVIDER_SITE_OTHER): Payer: Self-pay | Admitting: Thoracic Surgery (Cardiothoracic Vascular Surgery)

## 2019-03-09 ENCOUNTER — Encounter: Payer: Self-pay | Admitting: Family Medicine

## 2019-03-09 ENCOUNTER — Ambulatory Visit (INDEPENDENT_AMBULATORY_CARE_PROVIDER_SITE_OTHER): Payer: Medicare HMO | Admitting: Family Medicine

## 2019-03-09 ENCOUNTER — Ambulatory Visit
Admission: RE | Admit: 2019-03-09 | Discharge: 2019-03-09 | Disposition: A | Discharge status: Home / Self Care | Payer: Medicare HMO | Source: Ambulatory Visit | Attending: Thoracic Surgery (Cardiothoracic Vascular Surgery) | Admitting: Thoracic Surgery (Cardiothoracic Vascular Surgery)

## 2019-03-09 VITALS — HR 88 | Resp 16

## 2019-03-09 DIAGNOSIS — R109 Unspecified abdominal pain: Secondary | ICD-10-CM | POA: Diagnosis not present

## 2019-03-09 DIAGNOSIS — C3491 Malignant neoplasm of unspecified part of right bronchus or lung: Secondary | ICD-10-CM

## 2019-03-09 DIAGNOSIS — R079 Chest pain, unspecified: Secondary | ICD-10-CM | POA: Diagnosis not present

## 2019-03-09 DIAGNOSIS — K219 Gastro-esophageal reflux disease without esophagitis: Secondary | ICD-10-CM | POA: Diagnosis not present

## 2019-03-09 DIAGNOSIS — J439 Emphysema, unspecified: Secondary | ICD-10-CM

## 2019-03-09 DIAGNOSIS — Z720 Tobacco use: Secondary | ICD-10-CM

## 2019-03-09 DIAGNOSIS — Z902 Acquired absence of lung [part of]: Secondary | ICD-10-CM

## 2019-03-09 DIAGNOSIS — F419 Anxiety disorder, unspecified: Secondary | ICD-10-CM

## 2019-03-09 DIAGNOSIS — R5383 Other fatigue: Secondary | ICD-10-CM | POA: Diagnosis not present

## 2019-03-09 MED ORDER — OXYCODONE HCL 5 MG PO TABS: 5.0000 mg | ORAL_TABLET | Freq: Three times a day (TID) | ORAL | 0 refills | Status: DC | PRN

## 2019-03-09 NOTE — Assessment & Plan Note (Addendum)
Medication adjustments with the last visit have helped, so no changes in dose Xanax or Cymbalta. No changes in current management. F/U in 3-4 months

## 2019-03-09 NOTE — Assessment & Plan Note (Addendum)
She is still having some symptoms. Defeating PPIs she tried in the past did not help, so for now she will continue Dexilant 60 mg daily. GERD precautions discussed. Strongly recommend small bites of food frequent during the day.

## 2019-03-09 NOTE — Telephone Encounter (Signed)
Message sent to Dr. Jordan for review. 

## 2019-03-09 NOTE — Telephone Encounter (Signed)
[address]       [place] [address]             [phone]     CARDIOTHORACIC SURGERY TELEPHONE VIRTUAL OFFICE NOTE  PCP is Martinique, [name], MD Pulmonology: June Leap, DO  HPI:  I spoke with Tonya Bradley via telephone on 03/09/2019 at 3:02 PM and verified that I was speaking with the correct person.  Tonya Bradley is a 68 year old woman with a history of tobacco abuse, COPD, atrial flutter status post ablation, atrial fibrillation, coronary artery disease, hyperlipidemia, reflux, osteoporosis, depression, anxiety, and arthritis.  She recently was found to have a lung nodule on a low-dose screening CT.  There was low-level hypermetabolism on PET CT.  I did a thoracoscopic lobectomy on 02/04/2019.  The nodule was on the fissure and the fissure was indistinct.  Ended up having to take the right upper and middle lobes.  She had problems with mucous plugging postoperatively and required bronchoscopy to clear the plugs.  She ultimately went home on 02/18/2019.  Ellwood Handler, PA spoke with her by telephone on 02/24/2019.  She was doing well at that time.  Today she tells me that she has good days and bad days.  She says some days she just does not feel like getting out of bed at all.  She cannot describe any specific issue other than she feels "sick all over."  She has been having some problems with nausea.  She has been taking Zofran for that.  She is not having any significant respiratory issues.  She does not take narcotics every day but still does use them occasionally for significant pain.  She is down to her last pill from her initial postoperative prescription.  Past Medical History:  Diagnosis Date  . Anemia   . Anxiety   . Arthritis    "back"  . Atrial flutter ([place]) 03/13/2016  . CAD (coronary artery disease)   . Cancer ([place])    skin cancer  . Cataract   . Colon polyp   . Complication of anesthesia    BP dropped during esophagus diltation in  2017-admitted for 3 days.   Marland Kitchen COPD (chronic obstructive pulmonary disease) ([place])   . Depression   . Dysrhythmia   . Emphysema of lung ([place])   . GERD (gastroesophageal reflux disease)   . Hypercholesterolemia   . Osteoporosis   . Oxygen deficiency     Current Outpatient Medications  Medication Sig Dispense Refill  . ALPRAZolam (XANAX) 0.5 MG tablet Take 1 tablet (0.5 mg total) by mouth 2 (two) times daily as needed for anxiety. (Patient taking differently: Take 0.5 mg by mouth 2 (two) times daily. ) 60 tablet 1  . amiodarone (PACERONE) 200 MG tablet Take 1/2 tablet (100 mg) by mouth once daily (Patient taking differently: Take 100 mg by mouth daily. Take 1/2 tablet (100 mg) by mouth once daily) 90 tablet 3  . aspirin EC 81 MG tablet Take 81 mg by mouth daily.    Marland Kitchen atorvastatin (LIPITOR) 80 MG tablet TAKE 1 TABLET(80 MG) BY MOUTH DAILY (Patient taking differently: Take 80 mg by mouth daily. ) 90 tablet 1  . Cholecalciferol (VITAMIN D) 2000 units CAPS Take 2,000 Units by mouth daily.    . cycloSPORINE (RESTASIS) 0.05 % ophthalmic emulsion Place 1 drop into both eyes 2 (two) times daily.    Marland Kitchen Dexlansoprazole (DEXILANT) 30 MG capsule Take 1 capsule (30 mg total) by  mouth daily. 90 capsule 3  . diltiazem (CARDIZEM CD) 120 MG 24 hr capsule TAKE 1 CAPSULE(120 MG) BY MOUTH DAILY (Patient taking differently: Take 120 mg by mouth daily. ) 90 capsule 2  . doxepin (SINEQUAN) 10 MG capsule Take 20 mg by mouth at bedtime.    . DULoxetine (CYMBALTA) 60 MG capsule Take 1 capsule (60 mg total) by mouth daily. (Patient taking differently: Take 60 mg by mouth at bedtime. ) 90 capsule 1  . furosemide (LASIX) 20 MG tablet 1 tab 3 times per day. (Patient taking differently: Take 60 mg by mouth daily. ) 90 tablet 3  . guaiFENesin (MUCINEX) 600 MG 12 hr tablet Take 2 tablets (1,200 mg total) by mouth 2 (two) times daily. For 7 days, then use as needed 60 tablet 1  . LINZESS 290 MCG CAPS capsule TAKE 1 CAPSULE(290  MCG) BY MOUTH DAILY BEFORE BREAKFAST (Patient taking differently: Take 290 mcg by mouth daily as needed (constipation.). ) 30 capsule 0  . metoprolol tartrate (LOPRESSOR) 25 MG tablet Take 1 tablet (25 mg total) by mouth 2 (two) times daily. 60 tablet 3  . Multiple Vitamin (MULTIVITAMIN WITH MINERALS) TABS tablet Take 1 tablet by mouth daily.    . nitroGLYCERIN (NITROSTAT) 0.4 MG SL tablet Place 1 tablet (0.4 mg total) under the tongue every 5 (five) minutes as needed for chest pain. Reported on 06/12/2016 30 tablet 2  . ondansetron (ZOFRAN) 4 MG tablet TAKE 1 TABLET(4 MG) BY MOUTH EVERY 8 HOURS FOR UP TO 5 DAYS AS NEEDED FOR NAUSEA 15 tablet 0  . oxyCODONE (OXY IR/ROXICODONE) 5 MG immediate release tablet Take 1-2 tablets (5-10 mg total) by mouth every 4 (four) hours as needed for severe pain. 30 tablet 0  . potassium chloride (K-DUR) 10 MEQ tablet TAKE 1 TABLET(10 MEQ) BY MOUTH DAILY (Patient taking differently: Take 10 mEq by mouth daily. ) 90 tablet 2  . rOPINIRole (REQUIP) 0.25 MG tablet Take 2 tablets (0.5 mg total) by mouth at bedtime. 180 tablet 0  . TRELEGY ELLIPTA 100-62.5-25 MCG/INH AEPB INHALE 1 PUFF INTO THE LUNGS DAILY (Patient taking differently: Inhale 1 puff into the lungs daily. ) 60 each 3  . varenicline (CHANTIX PAK) 0.5 MG X 11 & 1 MG X 42 tablet Take one 0.5 mg tablet by mouth once daily for 3 days, then increase to one 0.5 mg tablet twice daily for 4 days, then increase to one 1 mg tablet twice daily. (Patient taking differently: Take 1 mg by mouth 2 (two) times daily. ) 53 tablet 0  . VENTOLIN HFA 108 (90 Base) MCG/ACT inhaler INHALE 2 PUFFS INTO THE LUNGS EVERY 6 HOURS AS NEEDED FOR WHEEZING OR SHORTNESS OF BREATH (Patient taking differently: Inhale 2 puffs into the lungs every 6 (six) hours as needed (wheezing/shortness of breath). ) 18 g 0   No current facility-administered medications for this visit.      Diagnostic Tests: CHEST - 2 VIEW  COMPARISON:  02/18/2019   FINDINGS: Cardiac shadows within normal limits. Aortic calcifications are noted. Left lung is well aerated without focal infiltrate or sizable effusion. Elevation of the right hemidiaphragm is again noted with postsurgical scarring at the base. No acute bony abnormality is noted.  IMPRESSION: Postsurgical changes on the right with elevation of the right hemidiaphragm and focal scarring in the base.  No acute abnormality is noted.   Electronically Signed   By: Inez Catalina M.D.   On: 03/09/2019 13:59 I personally reviewed  the chest x-ray images and concur with the findings noted above Impression:  Tonya Bradley is a 68 year old woman with a history of tobacco abuse, COPD, atrial flutter status post ablation, atrial fibrillation, coronary artery disease, hyperlipidemia, reflux, osteoporosis, depression, anxiety, and arthritis.  She was found to have a right upper lobe lung nodule on a low-dose screening CT.  I did a thoracoscopic bilobectomy on 02/04/2019.  The pathology showed a T1, N0 small cell carcinoma.  We did a fairly extensive lymph node dissection and there was no evidence of tumor in any of those.  From a surgical standpoint she was slow to progress postoperatively with mucous plugging ultimately requiring bronchoscopy.  She was finally able to go home after almost 2 weeks.  She continues to make slow progress.  I am not sure it is all surgical related.  She has some depression the operation may have aggravated that to some degree causing her to lack energy.  She is not having any fevers, chills, or sweats.  She does have expected postoperative discomfort.  Her narcotic use is not been excessive.  I am going to go ahead and give her a renewal but will change the oxycodone to every 8 hours and only give 20 tablets.  Hopefully, she will be able to wean off with that.  I did encourage her to get up and ambulate every day as that is important.  I cautioned her about the risk of developing  a blood clot if she spends too much time in bed.  She is not having any acute respiratory issues.  She has an unusual early stage small cell carcinoma.  She needs to be seen by oncology to discuss possible adjuvant chemotherapy.  We will arrange for that in our multidisciplinary thoracic oncology clinic.  Plan:  Referral to [place] Oxycodone 5 to 10 mg every 8 hours PRN, 20 tablets, no refills Return to office in 1 month with PA and lateral chest x-ray    I discussed limitations of evaluation and management via telephone.  The patient was advised to call back or seek an in-person evaluation if the patient's clinical condition changes in any significant manner.   Level 1  (99441)             5-10 minutes Level 2  (99442)            11-20 minutes Level 3  (99443)            21-30 minutes   Remo Lipps C. [name], MD [facility] [phone]  03/09/2019 3:02 PM

## 2019-03-09 NOTE — Progress Notes (Signed)
Virtual Visit via Video Note   I connected with Tonya Bradley on 03/09/19 at 12:00 PM EDT by a video enabled telemedicine application and verified that I am speaking with the correct person using two identifiers.  Location patient: home Location provider:work or home office Persons participating in the virtual visit: patient, provider  I discussed the limitations of evaluation and management by telemedicine and the availability of in person appointments. The patient expressed understanding and agreed to proceed.   HPI: Tonya Bradley is being seen today with her husband because concerned about feeling "weak",sleeping during the day,and decreased appetite. Last seen on 01/20/2019.  She is a poor historian, it is difficult to obtain details.  Her main concern today is having "no energy" and "no appetite" but later during her visit she mentions abdominal pain + other complaints. According to her husband, she sleeps most of the day. She states that she has "good days and bad days."  She states that she is drinking fluids. Negative for fever, chills, chest pain, dyspnea, palpitations, or Tonya changes. Denies urinary symptoms or decreased urine output.  Recently diagnosed with right lung cancer, she underwent right upper and middle lobectomy (02/04/2019). On 02/14/2019 she underwent bronchoscopy with bronchoalveolar lavage because of atelectasis caused by mucous plugs. She was discharged home on 02/18/2019.   History of COPD, she follows with pulmonologist. Currently she is on albuterol inhaler every 6 hours as needed. She is also taking Mucinex and supplemental O2 through the night.  She denies fever, chills, or body aches. Negative for sick contact or travel.  During visit I asked her to check pulse ox. Pulse ox with no supplemental oxygen 88 to 89%.   She denies unusual cough, dyspnea, or wheezing. She is still smoking, she is down to 1 to 2 cigarettes/day. She is having CxR today.  She also  mentions abdominal pain, which improves with defecation. She states that abdominal pain at this time is "not bad." She is currently on oxycodone 5 to 10 mg every 8 hours as needed.  Last bowel movement this morning, small. She has not noted blood in the stool or melena. She is passing gas. Nausea, which she has had for a while, Zofran 4 mg helps. She is having intermittent heartburn, she has not identified exacerbating or alleviating factors. She is on Dexilant 60 mg daily.  Negative for dysuria, gross hematuria, or increasing urinary frequency.  She is having some crying spells. Negative for suicidal thoughts. Currently she is on Cymbalta 60 mg daily and alprazolam 0.5 mg twice daily as needed, the latter one adjusted last visit.  She thinks alprazolam is helpful for anxiety.   ROS: See pertinent positives and negatives per HPI.  Past Medical History:  Diagnosis Date  . Anemia   . Anxiety   . Arthritis    "back"  . Atrial flutter ([place]) 03/13/2016  . CAD (coronary artery disease)   . Cancer ([place])    skin cancer  . Cataract   . Colon polyp   . Complication of anesthesia    BP dropped during esophagus diltation in 2017-admitted for 3 days.   Marland Kitchen COPD (chronic obstructive pulmonary disease) ([place])   . Depression   . Dysrhythmia   . Emphysema of lung ([place])   . GERD (gastroesophageal reflux disease)   . Hypercholesterolemia   . Osteoporosis   . Oxygen deficiency     Past Surgical History:  Procedure Laterality Date  . A-FLUTTER ABLATION N/A 05/19/2017   Procedure: A-Flutter  Ablation;  Surgeon: Evans Lance, MD;  Location: [facility];  Service: Cardiovascular;  Laterality: N/A;  . APPENDECTOMY    . BASAL CELL CARCINOMA EXCISION    . CARDIAC CATHETERIZATION    . CATARACT EXTRACTION Bilateral   . CHOLECYSTECTOMY    . COLONOSCOPY N/A 06/23/2014   Dr. Rourk:Rectal and colonic polyps-removed, Pancolonic diverticulosis. lymphocytic colitis  . CORONARY ANGIOPLASTY WITH  STENT PLACEMENT    . ESOPHAGEAL DILATION N/A 03/13/2016   Procedure: ESOPHAGEAL DILATION;  Surgeon: Daneil Dolin, MD;  [facility];  Service: Endoscopy;  Laterality: N/A;  . ESOPHAGOGASTRODUODENOSCOPY N/A 03/13/2016   Procedure: ESOPHAGOGASTRODUODENOSCOPY (EGD);  Surgeon: Daneil Dolin, MD;  [facility];  Service: Endoscopy;  Laterality: N/A;  0930  . EYE SURGERY     cataract removed, bilateral  . IR THORACENTESIS ASP PLEURAL SPACE W/IMG GUIDE  02/15/2019  . LUMBAR LAMINECTOMY/DECOMPRESSION MICRODISCECTOMY N/A 01/19/2018   Procedure: L4-5 DECOMPRESSION;  Surgeon: Marybelle Killings, MD;  Location: [facility];  Service: Orthopedics;  Laterality: N/A;  . VIDEO ASSISTED THORACOSCOPY (VATS)/ LOBECTOMY Right 02/04/2019   Procedure: VIDEO ASSISTED THORACOSCOPY (VATS)/RIGHT UPPER AND MIDDLE LOBECTOMY;  Surgeon: Melrose Nakayama, MD;  Location: [facility];  Service: Thoracic;  Laterality: Right;  Marland Kitchen VIDEO BRONCHOSCOPY N/A 02/14/2019   Procedure: BRONCHOSCOPY WITH SEDATION;  Surgeon: Melrose Nakayama, MD;  Location: [facility];  Service: Thoracic;  Laterality: N/A;    Family History  Problem Relation Age of Onset  . Breast cancer Sister 60  . Heart disease Sister   . Heart attack Father   . Heart disease Father   . Stroke Mother   . Heart disease Mother   . Hyperlipidemia Mother   . Heart attack Sister   . Heart disease Brother        ?heart failure  . Congestive Heart Failure Brother   . Hypertension Son   . Heart disease Maternal Grandfather   . Cancer Cousin        lymphoma  . Hypertension Son   . Stomach cancer Other   . Colon cancer Neg Hx   . Pancreatic cancer Neg Hx     Social History   Socioeconomic History  . Marital status: Married    Spouse name: Not on file  . Number of children: 3  . Years of education: 10  . Highest education level: Not on file  Occupational History  . 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 Financial  resource strain: Not on file  . Food insecurity:    Worry: Not on file    Inability: Not on file  . Transportation needs:    Medical: Not on file    Non-medical: Not on file  Tobacco Use  . Smoking status: Current Every Day Smoker    Packs/day: 0.25    Years: 54.00    Pack years: 13.50    Types: Cigarettes    Start date: 06/14/1966  . Smokeless tobacco: Never Used  . Tobacco comment: 1-2 packs daily. Chantix too expensive; pt states that she quit 2 weeks ago (02/04/19)   Substance and Sexual Activity  . Alcohol use: No    Alcohol/week: 0.0 standard drinks  . Drug use: No  . Sexual activity: Yes    Birth control/protection: Post-menopausal  Lifestyle  . Physical activity:    Days per week: Not on file    Minutes per session: Not on file  .  Stress: Not on file  Relationships  . Social connections:    Talks on phone: Not on file    Gets together: Not on file    Attends religious service: Not on file    Active member of club or organization: Not on file    Attends meetings of clubs or organizations: Not on file    Relationship status: Not on file  . Intimate partner violence:    Fear of current or ex partner: Not on file    Emotionally abused: Not on file    Physically abused: Not on file    Forced sexual activity: Not on file  Other Topics Concern  . Not on file  Social History Narrative   Quit high school and got married in 10th grade, age 33   First husband died of colon cancer at young age   Married again 2018   Lives in own home   Daughter lives with her   Drinks coffee seldom, too much "sweet tea"     Current Outpatient Medications:  .  ALPRAZolam (XANAX) 0.5 MG tablet, Take 1 tablet (0.5 mg total) by mouth 2 (two) times daily as needed for anxiety. (Patient taking differently: Take 0.5 mg by mouth 2 (two) times daily. ), Disp: 60 tablet, Rfl: 1 .  amiodarone (PACERONE) 200 MG tablet, Take 1/2 tablet (100 mg) by mouth once daily (Patient taking differently: Take  100 mg by mouth daily. Take 1/2 tablet (100 mg) by mouth once daily), Disp: 90 tablet, Rfl: 3 .  aspirin EC 81 MG tablet, Take 81 mg by mouth daily., Disp: , Rfl:  .  atorvastatin (LIPITOR) 80 MG tablet, TAKE 1 TABLET(80 MG) BY MOUTH DAILY (Patient taking differently: Take 80 mg by mouth daily. ), Disp: 90 tablet, Rfl: 1 .  Cholecalciferol (VITAMIN D) 2000 units CAPS, Take 2,000 Units by mouth daily., Disp: , Rfl:  .  cycloSPORINE (RESTASIS) 0.05 % ophthalmic emulsion, Place 1 drop into both eyes 2 (two) times daily., Disp: , Rfl:  .  Dexlansoprazole (DEXILANT) 30 MG capsule, Take 1 capsule (30 mg total) by mouth daily., Disp: 90 capsule, Rfl: 3 .  diltiazem (CARDIZEM CD) 120 MG 24 hr capsule, TAKE 1 CAPSULE(120 MG) BY MOUTH DAILY (Patient taking differently: Take 120 mg by mouth daily. ), Disp: 90 capsule, Rfl: 2 .  doxepin (SINEQUAN) 10 MG capsule, Take 20 mg by mouth at bedtime., Disp: , Rfl:  .  DULoxetine (CYMBALTA) 60 MG capsule, Take 1 capsule (60 mg total) by mouth daily. (Patient taking differently: Take 60 mg by mouth at bedtime. ), Disp: 90 capsule, Rfl: 1 .  furosemide (LASIX) 20 MG tablet, 1 tab 3 times per day. (Patient taking differently: Take 60 mg by mouth daily. ), Disp: 90 tablet, Rfl: 3 .  guaiFENesin (MUCINEX) 600 MG 12 hr tablet, Take 2 tablets (1,200 mg total) by mouth 2 (two) times daily. For 7 days, then use as needed, Disp: 60 tablet, Rfl: 1 .  LINZESS 290 MCG CAPS capsule, TAKE 1 CAPSULE(290 MCG) BY MOUTH DAILY BEFORE BREAKFAST (Patient taking differently: Take 290 mcg by mouth daily as needed (constipation.). ), Disp: 30 capsule, Rfl: 0 .  metoprolol tartrate (LOPRESSOR) 25 MG tablet, Take 1 tablet (25 mg total) by mouth 2 (two) times daily., Disp: 60 tablet, Rfl: 3 .  Multiple Vitamin (MULTIVITAMIN WITH MINERALS) TABS tablet, Take 1 tablet by mouth daily., Disp: , Rfl:  .  nitroGLYCERIN (NITROSTAT) 0.4 MG SL tablet, Place 1  tablet (0.4 mg total) under the tongue every 5  (five) minutes as needed for chest pain. Reported on 06/12/2016, Disp: 30 tablet, Rfl: 2 .  ondansetron (ZOFRAN) 4 MG tablet, TAKE 1 TABLET(4 MG) BY MOUTH EVERY 8 HOURS FOR UP TO 5 DAYS AS NEEDED FOR NAUSEA, Disp: 15 tablet, Rfl: 0 .  oxyCODONE (OXY IR/ROXICODONE) 5 MG immediate release tablet, Take 1-2 tablets (5-10 mg total) by mouth every 8 (eight) hours as needed for severe pain., Disp: 20 tablet, Rfl: 0 .  potassium chloride (K-DUR) 10 MEQ tablet, TAKE 1 TABLET(10 MEQ) BY MOUTH DAILY (Patient taking differently: Take 10 mEq by mouth daily. ), Disp: 90 tablet, Rfl: 2 .  rOPINIRole (REQUIP) 0.25 MG tablet, Take 2 tablets (0.5 mg total) by mouth at bedtime., Disp: 180 tablet, Rfl: 0 .  TRELEGY ELLIPTA 100-62.5-25 MCG/INH AEPB, INHALE 1 PUFF INTO THE LUNGS DAILY (Patient taking differently: Inhale 1 puff into the lungs daily. ), Disp: 60 each, Rfl: 3 .  varenicline (CHANTIX PAK) 0.5 MG X 11 & 1 MG X 42 tablet, Take one 0.5 mg tablet by mouth once daily for 3 days, then increase to one 0.5 mg tablet twice daily for 4 days, then increase to one 1 mg tablet twice daily. (Patient taking differently: Take 1 mg by mouth 2 (two) times daily. ), Disp: 53 tablet, Rfl: 0 .  VENTOLIN HFA 108 (90 Base) MCG/ACT inhaler, INHALE 2 PUFFS INTO THE LUNGS EVERY 6 HOURS AS NEEDED FOR WHEEZING OR SHORTNESS OF BREATH (Patient taking differently: Inhale 2 puffs into the lungs every 6 (six) hours as needed (wheezing/shortness of breath). ), Disp: 18 g, Rfl: 0  EXAM:  VITALS per patient if applicable:Pulse 88   Resp 16   SpO2 94% Comment: 2% sup O2 per [place]  GENERAL: alert, oriented, appears well and in no acute distress  HEENT: atraumatic, conjunttiva clear, no obvious abnormalities on inspection of face.  NECK: normal movements of the head and neck  LUNGS: on inspection no signs of respiratory distress, breathing rate appears normal, no obvious gross SOB, gasping or wheezing  CV: no obvious cyanosis  Tonya: moves all  visible extremities without noticeable abnormality  PSYCH/NEURO: pleasant and cooperative, no obvious depression, + anxious. Speech and thought processing grossly intact  ASSESSMENT AND PLAN:  Discussed the following assessment and plan:  Other fatigue We discussed possible causes. She was recently discharged from hospital after undergoing lung surgery and bronchioalveolar lavage. I do not think she needs to go to the ER now, I think we can continue monitoring.Explained that it may take some time to go back to her baseline. Advised to try small bites of food throughout the day, she can try Ensure or boost 1 to 2/day, also adequate hydration.  I also do not think lab work is needed at this time. Clearly instructed about warning signs.  Abdominal pain, unspecified abdominal location She does not seem to be in acute distress on reporting improvement after defecation,and "not bad" at this time;so I do not think imaging is needed at this time. It could be related with constipation. We discussed some side effects of opioids. She is currently on Linzess, no changes in current management. Instructed about warning signs.  Pulmonary emphysema, unspecified emphysema type ([place]) Initial pulse O2 w/o supplemental oxygen was 88-89%, during visit she started with O2 2 LPM with [place] and O2 went up to 94%. She is not in respiratory distress, so I do not think she needs to be  evaluated in the ER. Recommend to use supplemental O2 continuously. She is having CXR today. Instructed about warning signs.  Chronic anxiety Medication adjustments with the last visit have helped, so no changes in dose Xanax or Cymbalta. No changes in current management. F/U in 3-4 months  GERD (gastroesophageal reflux disease) She is still having some symptoms. Defeating PPIs she tried in the past did not help, so for now she will continue Dexilant 60 mg daily. GERD precautions discussed. Strongly recommend small bites of  food frequent during the day.  Tobacco abuse She has decreased numbers of cig/day. She understands benefits of smoking cessation as well as adverse effects of tobacco use. Encouraged to continue working on smoking cessation.  Reviewing lab work after visit noted hypoK+ and mild anemia. We need to discuss if we re-check labs in the clinic or she does so during f/u with Dr [name]. Discussed labs with Mr Littleton. He is reporting that since visit she is feeling better,she ate dinner and tolerated well. Recommend rich K+ diet for now. Because risk of coronavirus exposure we agreed on holding on labs as far as she is eating and doing better. She has appt with surgeon and oncologist in 6 and 2 weeks respectively.  I discussed the assessment and treatment plan with the patient. She was provided an opportunity to ask questions and all were answered. The patient agreed with the plan and demonstrated an understanding of the instructions. She will follow in 2-3 weeks with me if needed.    The patient was advised to call back or seek an in-person evaluation if the symptoms worsen or if the condition fails to improve as anticipated.    Return in about 3 months (around 06/08/2019) for anxiety,GERD,HTN.    Betty Martinique, MD

## 2019-03-09 NOTE — Telephone Encounter (Signed)
Visit made for 03/09/2019 at 12 pm.

## 2019-03-09 NOTE — Assessment & Plan Note (Signed)
She has decreased numbers of cig/day. She understands benefits of smoking cessation as well as adverse effects of tobacco use. Encouraged to continue working on smoking cessation.

## 2019-03-09 NOTE — Telephone Encounter (Signed)
Can you please arrange a virtual visit. Thanks, BJ

## 2019-03-11 DIAGNOSIS — I83893 Varicose veins of bilateral lower extremities with other complications: Secondary | ICD-10-CM | POA: Diagnosis not present

## 2019-03-11 DIAGNOSIS — I2510 Atherosclerotic heart disease of native coronary artery without angina pectoris: Secondary | ICD-10-CM | POA: Diagnosis not present

## 2019-03-11 DIAGNOSIS — C3491 Malignant neoplasm of unspecified part of right bronchus or lung: Secondary | ICD-10-CM | POA: Diagnosis not present

## 2019-03-11 DIAGNOSIS — J438 Other emphysema: Secondary | ICD-10-CM | POA: Diagnosis not present

## 2019-03-11 DIAGNOSIS — G8929 Other chronic pain: Secondary | ICD-10-CM | POA: Diagnosis not present

## 2019-03-11 DIAGNOSIS — I4892 Unspecified atrial flutter: Secondary | ICD-10-CM | POA: Diagnosis not present

## 2019-03-11 DIAGNOSIS — Z483 Aftercare following surgery for neoplasm: Secondary | ICD-10-CM | POA: Diagnosis not present

## 2019-03-11 DIAGNOSIS — M48061 Spinal stenosis, lumbar region without neurogenic claudication: Secondary | ICD-10-CM | POA: Diagnosis not present

## 2019-03-11 DIAGNOSIS — J9611 Chronic respiratory failure with hypoxia: Secondary | ICD-10-CM | POA: Diagnosis not present

## 2019-03-12 DIAGNOSIS — J9611 Chronic respiratory failure with hypoxia: Secondary | ICD-10-CM | POA: Diagnosis not present

## 2019-03-12 DIAGNOSIS — I83893 Varicose veins of bilateral lower extremities with other complications: Secondary | ICD-10-CM | POA: Diagnosis not present

## 2019-03-12 DIAGNOSIS — I2510 Atherosclerotic heart disease of native coronary artery without angina pectoris: Secondary | ICD-10-CM | POA: Diagnosis not present

## 2019-03-12 DIAGNOSIS — G8929 Other chronic pain: Secondary | ICD-10-CM | POA: Diagnosis not present

## 2019-03-12 DIAGNOSIS — J438 Other emphysema: Secondary | ICD-10-CM | POA: Diagnosis not present

## 2019-03-12 DIAGNOSIS — C3491 Malignant neoplasm of unspecified part of right bronchus or lung: Secondary | ICD-10-CM | POA: Diagnosis not present

## 2019-03-12 DIAGNOSIS — M48061 Spinal stenosis, lumbar region without neurogenic claudication: Secondary | ICD-10-CM | POA: Diagnosis not present

## 2019-03-12 DIAGNOSIS — Z483 Aftercare following surgery for neoplasm: Secondary | ICD-10-CM | POA: Diagnosis not present

## 2019-03-12 DIAGNOSIS — I4892 Unspecified atrial flutter: Secondary | ICD-10-CM | POA: Diagnosis not present

## 2019-03-15 ENCOUNTER — Encounter: Payer: Self-pay | Admitting: Family Medicine

## 2019-03-15 ENCOUNTER — Ambulatory Visit (INDEPENDENT_AMBULATORY_CARE_PROVIDER_SITE_OTHER): Payer: Medicare HMO | Admitting: Family Medicine

## 2019-03-15 ENCOUNTER — Other Ambulatory Visit: Payer: Self-pay | Admitting: Family Medicine

## 2019-03-15 ENCOUNTER — Other Ambulatory Visit: Payer: Self-pay

## 2019-03-15 VITALS — Resp 16

## 2019-03-15 DIAGNOSIS — E876 Hypokalemia: Secondary | ICD-10-CM | POA: Diagnosis not present

## 2019-03-15 DIAGNOSIS — J439 Emphysema, unspecified: Secondary | ICD-10-CM

## 2019-03-15 DIAGNOSIS — I83813 Varicose veins of bilateral lower extremities with pain: Secondary | ICD-10-CM

## 2019-03-15 DIAGNOSIS — R946 Abnormal results of thyroid function studies: Secondary | ICD-10-CM

## 2019-03-15 DIAGNOSIS — R251 Tremor, unspecified: Secondary | ICD-10-CM | POA: Diagnosis not present

## 2019-03-15 DIAGNOSIS — R110 Nausea: Secondary | ICD-10-CM | POA: Diagnosis not present

## 2019-03-15 MED ORDER — ONDANSETRON HCL 4 MG PO TABS: 4.0000 mg | ORAL_TABLET | Freq: Two times a day (BID) | ORAL | 0 refills | Status: DC | PRN

## 2019-03-15 NOTE — Progress Notes (Signed)
Virtual Visit via Video Note   I connected with Tonya Bradley on 03/15/19 at  3:30 PM EDT by a video enabled telemedicine application and verified that I am speaking with the correct person using two identifiers.  Location patient: home Location provider:work or home office Persons participating in the virtual visit: patient, provider  I discussed the limitations of evaluation and management by telemedicine and the availability of in person appointments. The patient expressed understanding and agreed to proceed.  HPI: 68 yo who recently underwent partial right lobectomy and bronchoscopy with sedation. Dx with lung cancer.  She was last seen on 03/09/19, when she was c/o fatigue,decreased appetite, and O2 sat was 88-89%. She is using continues O2 supplementation. She denies cough, dyspnea, wheezing,or chest pain. She is feeling a lot of better.  She is with her husband and concerned about "hands shaking." Bilateral hand tremor, R>L,intermittent that she noted right after lung surgery,it seems to be better today.She did not mention this problem last visit. She has not identified exacerbating or alleviating factors. It does not interfere with activities like writing or eating.  Denies fever,chills, visual changes,headache, numbness,tingling,or focal neurologic deficit.  Abnormal TSH. Negative for palpitation,heat intolerance,or diarrhea.  Lab Results  Component Value Date   TSH 0.039 (L) 01/02/2019   HypoK+, she is on K-DUR 10 meq daily.  Lab Results  Component Value Date   CREATININE 0.54 02/13/2019   BUN 10 02/13/2019   NA 139 02/13/2019   K 3.0 (L) 02/13/2019   CL 94 (L) 02/13/2019   CO2 31 02/13/2019   Nausea has also improved. Increased oral intake. Taking Zofran 4 mg tid,she would like a refills.  Denies abdominal pain,vomiting, changes in bowel habits, blood in stool or melena.  ROS: See pertinent positives and negatives per HPI.  Past Medical History:  Diagnosis  Date  . Anemia   . Anxiety   . Arthritis    "back"  . Atrial flutter ([place]) 03/13/2016  . CAD (coronary artery disease)   . Cancer ([place])    skin cancer  . Cataract   . Colon polyp   . Complication of anesthesia    BP dropped during esophagus diltation in 2017-admitted for 3 days.   Marland Kitchen COPD (chronic obstructive pulmonary disease) ([place])   . Depression   . Dysrhythmia   . Emphysema of lung ([place])   . GERD (gastroesophageal reflux disease)   . Hypercholesterolemia   . Osteoporosis   . Oxygen deficiency     Past Surgical History:  Procedure Laterality Date  . A-FLUTTER ABLATION N/A 05/19/2017   Procedure: A-Flutter Ablation;  Surgeon: Evans Lance, MD;  Location: [facility];  Service: Cardiovascular;  Laterality: N/A;  . APPENDECTOMY    . BASAL CELL CARCINOMA EXCISION    . CARDIAC CATHETERIZATION    . CATARACT EXTRACTION Bilateral   . CHOLECYSTECTOMY    . COLONOSCOPY N/A 06/23/2014   Dr. Rourk:Rectal and colonic polyps-removed, Pancolonic diverticulosis. lymphocytic colitis  . CORONARY ANGIOPLASTY WITH STENT PLACEMENT    . ESOPHAGEAL DILATION N/A 03/13/2016   Procedure: ESOPHAGEAL DILATION;  Surgeon: Daneil Dolin, MD;  [facility];  Service: Endoscopy;  Laterality: N/A;  . ESOPHAGOGASTRODUODENOSCOPY N/A 03/13/2016   Procedure: ESOPHAGOGASTRODUODENOSCOPY (EGD);  Surgeon: Daneil Dolin, MD;  [facility];  Service: Endoscopy;  Laterality: N/A;  0930  . EYE SURGERY     cataract removed, bilateral  . IR THORACENTESIS ASP PLEURAL SPACE W/IMG GUIDE  02/15/2019  .  LUMBAR LAMINECTOMY/DECOMPRESSION MICRODISCECTOMY N/A 01/19/2018   Procedure: L4-5 DECOMPRESSION;  Surgeon: Marybelle Killings, MD;  Location: [facility];  Service: Orthopedics;  Laterality: N/A;  . VIDEO ASSISTED THORACOSCOPY (VATS)/ LOBECTOMY Right 02/04/2019   Procedure: VIDEO ASSISTED THORACOSCOPY (VATS)/RIGHT UPPER AND MIDDLE LOBECTOMY;  Surgeon: Melrose Nakayama, MD;  Location: [facility];  Service:  Thoracic;  Laterality: Right;  Marland Kitchen VIDEO BRONCHOSCOPY N/A 02/14/2019   Procedure: BRONCHOSCOPY WITH SEDATION;  Surgeon: Melrose Nakayama, MD;  Location: [facility];  Service: Thoracic;  Laterality: N/A;    Family History  Problem Relation Age of Onset  . Breast cancer Sister 66  . Heart disease Sister   . Heart attack Father   . Heart disease Father   . Stroke Mother   . Heart disease Mother   . Hyperlipidemia Mother   . Heart attack Sister   . Heart disease Brother        ?heart failure  . Congestive Heart Failure Brother   . Hypertension Son   . Heart disease Maternal Grandfather   . Cancer Cousin        lymphoma  . Hypertension Son   . Stomach cancer Other   . Colon cancer Neg Hx   . Pancreatic cancer Neg Hx     Social History   Socioeconomic History  . Marital status: Married    Spouse name: Not on file  . Number of children: 3  . Years of education: 10  . Highest education level: Not on file  Occupational History  . Occupation: retired    Comment: customer service [employer]  . Financial resource strain: Not on file  . Food insecurity:    Worry: Not on file    Inability: Not on file  . Transportation needs:    Medical: Not on file    Non-medical: Not on file  Tobacco Use  . Smoking status: Current Every Day Smoker    Packs/day: 0.25    Years: 54.00    Pack years: 13.50    Types: Cigarettes    Start date: 06/14/1966  . Smokeless tobacco: Never Used  . Tobacco comment: 1-2 packs daily. Chantix too expensive; pt states that she quit 2 weeks ago (02/04/19)   Substance and Sexual Activity  . Alcohol use: No    Alcohol/week: 0.0 standard drinks  . Drug use: No  . Sexual activity: Yes    Birth control/protection: Post-menopausal  Lifestyle  . Physical activity:    Days per week: Not on file    Minutes per session: Not on file  . Stress: Not on file  Relationships  . Social connections:    Talks on phone: Not on file    Gets together: Not on  file    Attends religious service: Not on file    Active member of club or organization: Not on file    Attends meetings of clubs or organizations: Not on file    Relationship status: Not on file  . Intimate partner violence:    Fear of current or ex partner: Not on file    Emotionally abused: Not on file    Physically abused: Not on file    Forced sexual activity: Not on file  Other Topics Concern  . Not on file  Social History Narrative   Quit high school and got married in 10th grade, age 17   First husband died of colon cancer at young age   Married again 2018  Lives in own home   Daughter lives with her   Drinks coffee seldom, too much "sweet tea"      Current Outpatient Medications:  .  ALPRAZolam (XANAX) 0.5 MG tablet, Take 1 tablet (0.5 mg total) by mouth 2 (two) times daily as needed for anxiety. (Patient taking differently: Take 0.5 mg by mouth 2 (two) times daily. ), Disp: 60 tablet, Rfl: 1 .  amiodarone (PACERONE) 200 MG tablet, Take 1/2 tablet (100 mg) by mouth once daily (Patient taking differently: Take 100 mg by mouth daily. Take 1/2 tablet (100 mg) by mouth once daily), Disp: 90 tablet, Rfl: 3 .  aspirin EC 81 MG tablet, Take 81 mg by mouth daily., Disp: , Rfl:  .  atorvastatin (LIPITOR) 80 MG tablet, TAKE 1 TABLET(80 MG) BY MOUTH DAILY (Patient taking differently: Take 80 mg by mouth daily. ), Disp: 90 tablet, Rfl: 1 .  Cholecalciferol (VITAMIN D) 2000 units CAPS, Take 2,000 Units by mouth daily., Disp: , Rfl:  .  cycloSPORINE (RESTASIS) 0.05 % ophthalmic emulsion, Place 1 drop into both eyes 2 (two) times daily., Disp: , Rfl:  .  Dexlansoprazole (DEXILANT) 30 MG capsule, Take 1 capsule (30 mg total) by mouth daily., Disp: 90 capsule, Rfl: 3 .  diltiazem (CARDIZEM CD) 120 MG 24 hr capsule, TAKE 1 CAPSULE(120 MG) BY MOUTH DAILY (Patient taking differently: Take 120 mg by mouth daily. ), Disp: 90 capsule, Rfl: 2 .  doxepin (SINEQUAN) 10 MG capsule, Take 20 mg by mouth  at bedtime., Disp: , Rfl:  .  DULoxetine (CYMBALTA) 60 MG capsule, Take 1 capsule (60 mg total) by mouth daily. (Patient taking differently: Take 60 mg by mouth at bedtime. ), Disp: 90 capsule, Rfl: 1 .  furosemide (LASIX) 20 MG tablet, 1 tab 3 times per day. (Patient taking differently: Take 60 mg by mouth daily. ), Disp: 90 tablet, Rfl: 3 .  guaiFENesin (MUCINEX) 600 MG 12 hr tablet, Take 2 tablets (1,200 mg total) by mouth 2 (two) times daily. For 7 days, then use as needed, Disp: 60 tablet, Rfl: 1 .  LINZESS 290 MCG CAPS capsule, TAKE 1 CAPSULE(290 MCG) BY MOUTH DAILY BEFORE BREAKFAST (Patient taking differently: Take 290 mcg by mouth daily as needed (constipation.). ), Disp: 30 capsule, Rfl: 0 .  metoprolol tartrate (LOPRESSOR) 25 MG tablet, Take 1 tablet (25 mg total) by mouth 2 (two) times daily., Disp: 60 tablet, Rfl: 3 .  Multiple Vitamin (MULTIVITAMIN WITH MINERALS) TABS tablet, Take 1 tablet by mouth daily., Disp: , Rfl:  .  nitroGLYCERIN (NITROSTAT) 0.4 MG SL tablet, Place 1 tablet (0.4 mg total) under the tongue every 5 (five) minutes as needed for chest pain. Reported on 06/12/2016, Disp: 30 tablet, Rfl: 2 .  ondansetron (ZOFRAN) 4 MG tablet, Take 1 tablet (4 mg total) by mouth 2 (two) times daily as needed for nausea or vomiting., Disp: 40 tablet, Rfl: 0 .  oxyCODONE (OXY IR/ROXICODONE) 5 MG immediate release tablet, Take 1-2 tablets (5-10 mg total) by mouth every 8 (eight) hours as needed for severe pain., Disp: 20 tablet, Rfl: 0 .  potassium chloride (K-DUR) 10 MEQ tablet, TAKE 1 TABLET(10 MEQ) BY MOUTH DAILY (Patient taking differently: Take 10 mEq by mouth daily. ), Disp: 90 tablet, Rfl: 2 .  rOPINIRole (REQUIP) 0.25 MG tablet, TAKE 2 TABLETS(0.5 MG) BY MOUTH AT BEDTIME, Disp: 180 tablet, Rfl: 0 .  TRELEGY ELLIPTA 100-62.5-25 MCG/INH AEPB, INHALE 1 PUFF INTO THE LUNGS DAILY (Patient taking differently:  Inhale 1 puff into the lungs daily. ), Disp: 60 each, Rfl: 3 .  VENTOLIN HFA 108 (90  Base) MCG/ACT inhaler, INHALE 2 PUFFS INTO THE LUNGS EVERY 6 HOURS AS NEEDED FOR WHEEZING OR SHORTNESS OF BREATH (Patient taking differently: Inhale 2 puffs into the lungs every 6 (six) hours as needed (wheezing/shortness of breath). ), Disp: 18 g, Rfl: 0  EXAM:  VITALS per patient if applicable:Resp 16   GENERAL: alert, oriented, appears well and in no acute distress  HEENT: atraumatic, conjunttiva clear, no obvious abnormalities on inspection of face.  LUNGS: on inspection no signs of respiratory distress, breathing rate appears normal, no obvious gross SOB, gasping or wheezing  CV: no obvious cyanosis  Tonya: moves all visible extremities without noticeable abnormality  PSYCH/NEURO: pleasant and cooperative, no obvious depression,+ anxious. Speech and thought processing grossly intact.  No focal deficit appreciated. Tremor of hands noted with UE elevation. During visit I did not appreciate tremor nor when she put on her eye glasses.  ASSESSMENT AND PLAN:  Discussed the following assessment and plan:  Tremor, unspecified - Plan: Basic metabolic panel Improving. Possible etiologies discussed. ? Essential tremor,anxiety,endocrinologic problem,med side effects,and electrolyte abnormalities among some to be considered. Instructed about warning signs. We will try to arrange blood [facility] through [facility].  Nausea without vomiting - Plan: ondansetron (ZOFRAN) 4 MG tablet Improved. Recommend Zofran 4 mg bid as needed. GERD precautions. Some of her meds could be aggravating problem.  Hypokalemia - Plan: Basic metabolic panel NO changes in K_DUR 10 meq daily. Oral intake has improved. Instructed about warning signs.  Pulmonary emphysema, unspecified emphysema type ([place]) Stable. Continue constant O2 supplementation for now. No changes in current management, Instructed about warning signs.  Abnormal thyroid function test - Plan: TSH, T4, free, T3, free She has HH service,we will try to  arrange blood drawn at home. She is on Amiodarone. Further recommendations will be given according to TSH results.    I discussed the assessment and treatment plan with the patient. The patient was provided an opportunity to ask questions and all were answered. The patient agreed with the plan and demonstrated an understanding of the instructions.   The patient was advised to call back or seek an in-person evaluation if the symptoms worsen or if the condition fails to improve as anticipated.  Return if symptoms worsen or fail to improve, for Keep next f/u appt.    Betty Martinique, MD

## 2019-03-16 ENCOUNTER — Other Ambulatory Visit: Payer: Self-pay | Admitting: Family Medicine

## 2019-03-18 ENCOUNTER — Telehealth: Payer: Self-pay | Admitting: *Deleted

## 2019-03-18 DIAGNOSIS — C3491 Malignant neoplasm of unspecified part of right bronchus or lung: Secondary | ICD-10-CM

## 2019-03-18 NOTE — Telephone Encounter (Signed)
Oncology Nurse Navigator Documentation  Oncology Nurse Navigator Flowsheets 03/18/2019  Navigator Location [facility]  Referral date to RadOnc/MedOnc 03/18/2019  Navigator Encounter Type Telephone/I received referral from Dr. Leonarda Salon office.  I called and scheduled Ms. Amero to be seen with Dr. Julien Nordmann next week.   Telephone Outgoing Call  Barriers/Navigation Needs Education;Coordination of Care  Education Other  Interventions Coordination of Care;Education  Coordination of Care Appts  Education Method Verbal  Acuity Level 2  Time Spent with Patient 30

## 2019-03-19 DIAGNOSIS — Z483 Aftercare following surgery for neoplasm: Secondary | ICD-10-CM | POA: Diagnosis not present

## 2019-03-19 DIAGNOSIS — I83893 Varicose veins of bilateral lower extremities with other complications: Secondary | ICD-10-CM | POA: Diagnosis not present

## 2019-03-19 DIAGNOSIS — J438 Other emphysema: Secondary | ICD-10-CM | POA: Diagnosis not present

## 2019-03-19 DIAGNOSIS — I2510 Atherosclerotic heart disease of native coronary artery without angina pectoris: Secondary | ICD-10-CM | POA: Diagnosis not present

## 2019-03-19 DIAGNOSIS — G8929 Other chronic pain: Secondary | ICD-10-CM | POA: Diagnosis not present

## 2019-03-19 DIAGNOSIS — I4892 Unspecified atrial flutter: Secondary | ICD-10-CM | POA: Diagnosis not present

## 2019-03-19 DIAGNOSIS — C3491 Malignant neoplasm of unspecified part of right bronchus or lung: Secondary | ICD-10-CM | POA: Diagnosis not present

## 2019-03-19 DIAGNOSIS — J9611 Chronic respiratory failure with hypoxia: Secondary | ICD-10-CM | POA: Diagnosis not present

## 2019-03-19 DIAGNOSIS — M48061 Spinal stenosis, lumbar region without neurogenic claudication: Secondary | ICD-10-CM | POA: Diagnosis not present

## 2019-03-22 ENCOUNTER — Other Ambulatory Visit: Payer: Self-pay | Admitting: *Deleted

## 2019-03-22 ENCOUNTER — Other Ambulatory Visit: Payer: Self-pay | Admitting: Thoracic Surgery (Cardiothoracic Vascular Surgery)

## 2019-03-22 ENCOUNTER — Encounter: Payer: Self-pay | Admitting: Thoracic Surgery (Cardiothoracic Vascular Surgery)

## 2019-03-22 DIAGNOSIS — G8918 Other acute postprocedural pain: Secondary | ICD-10-CM

## 2019-03-22 MED ORDER — OXYCODONE HCL 5 MG PO TABS: 5.0000 mg | ORAL_TABLET | Freq: Three times a day (TID) | ORAL | 0 refills | Status: DC | PRN

## 2019-03-22 NOTE — Telephone Encounter (Signed)
Requested more pain medication  Last order oxycodone 5-10 mg PO Q8 PRN, #20, 0 refill on 03/09/19  Reordered  Remo Lipps C. [name], MD [facility] [phone]

## 2019-03-25 ENCOUNTER — Encounter: Payer: Self-pay | Admitting: *Deleted

## 2019-03-25 ENCOUNTER — Inpatient Hospital Stay: Payer: Medicare HMO | Attending: Internal Medicine | Admitting: Internal Medicine

## 2019-03-25 ENCOUNTER — Other Ambulatory Visit: Payer: Self-pay

## 2019-03-25 ENCOUNTER — Inpatient Hospital Stay: Payer: Medicare HMO

## 2019-03-25 ENCOUNTER — Encounter: Payer: Self-pay | Admitting: Internal Medicine

## 2019-03-25 ENCOUNTER — Other Ambulatory Visit: Payer: Self-pay | Admitting: *Deleted

## 2019-03-25 VITALS — BP 108/60 | HR 80 | Temp 98.0°F | Resp 18 | Ht 62.0 in | Wt 145.7 lb

## 2019-03-25 DIAGNOSIS — J449 Chronic obstructive pulmonary disease, unspecified: Secondary | ICD-10-CM | POA: Diagnosis not present

## 2019-03-25 DIAGNOSIS — D649 Anemia, unspecified: Secondary | ICD-10-CM | POA: Insufficient documentation

## 2019-03-25 DIAGNOSIS — Z800 Family history of malignant neoplasm of digestive organs: Secondary | ICD-10-CM | POA: Diagnosis not present

## 2019-03-25 DIAGNOSIS — R197 Diarrhea, unspecified: Secondary | ICD-10-CM | POA: Diagnosis not present

## 2019-03-25 DIAGNOSIS — Z7189 Other specified counseling: Secondary | ICD-10-CM

## 2019-03-25 DIAGNOSIS — C3491 Malignant neoplasm of unspecified part of right bronchus or lung: Secondary | ICD-10-CM

## 2019-03-25 DIAGNOSIS — Z720 Tobacco use: Secondary | ICD-10-CM

## 2019-03-25 DIAGNOSIS — Z5111 Encounter for antineoplastic chemotherapy: Secondary | ICD-10-CM

## 2019-03-25 DIAGNOSIS — I2510 Atherosclerotic heart disease of native coronary artery without angina pectoris: Secondary | ICD-10-CM | POA: Diagnosis not present

## 2019-03-25 DIAGNOSIS — Z9981 Dependence on supplemental oxygen: Secondary | ICD-10-CM

## 2019-03-25 DIAGNOSIS — C3411 Malignant neoplasm of upper lobe, right bronchus or lung: Secondary | ICD-10-CM

## 2019-03-25 DIAGNOSIS — Z85828 Personal history of other malignant neoplasm of skin: Secondary | ICD-10-CM | POA: Insufficient documentation

## 2019-03-25 DIAGNOSIS — F419 Anxiety disorder, unspecified: Secondary | ICD-10-CM | POA: Insufficient documentation

## 2019-03-25 DIAGNOSIS — K5900 Constipation, unspecified: Secondary | ICD-10-CM | POA: Diagnosis not present

## 2019-03-25 DIAGNOSIS — R079 Chest pain, unspecified: Secondary | ICD-10-CM | POA: Insufficient documentation

## 2019-03-25 DIAGNOSIS — R0609 Other forms of dyspnea: Secondary | ICD-10-CM

## 2019-03-25 DIAGNOSIS — F329 Major depressive disorder, single episode, unspecified: Secondary | ICD-10-CM | POA: Diagnosis not present

## 2019-03-25 DIAGNOSIS — Z87891 Personal history of nicotine dependence: Secondary | ICD-10-CM | POA: Insufficient documentation

## 2019-03-25 DIAGNOSIS — Z803 Family history of malignant neoplasm of breast: Secondary | ICD-10-CM | POA: Diagnosis not present

## 2019-03-25 DIAGNOSIS — H538 Other visual disturbances: Secondary | ICD-10-CM

## 2019-03-25 DIAGNOSIS — K219 Gastro-esophageal reflux disease without esophagitis: Secondary | ICD-10-CM | POA: Insufficient documentation

## 2019-03-25 LAB — CMP (CANCER CENTER ONLY)
ALT: 12 U/L (ref 0–44)
AST: 16 U/L (ref 15–41)
Albumin: 3 g/dL — ABNORMAL LOW (ref 3.5–5.0)
Alkaline Phosphatase: 178 U/L — ABNORMAL HIGH (ref 38–126)
Anion gap: 10 (ref 5–15)
BUN: 11 mg/dL (ref 8–23)
CO2: 34 mmol/L — ABNORMAL HIGH (ref 22–32)
Calcium: 9.3 mg/dL (ref 8.9–10.3)
Chloride: 99 mmol/L (ref 98–111)
Creatinine: 0.64 mg/dL (ref 0.44–1.00)
GFR, Est AFR Am: >60 mL/min (ref >60)
GFR, Estimated: >60 mL/min (ref >60)
Glucose, Bld: 108 mg/dL — ABNORMAL HIGH (ref 70–99)
Potassium: 4 mmol/L (ref 3.5–5.1)
Sodium: 143 mmol/L (ref 135–145)
Total Bilirubin: 0.4 mg/dL (ref 0.3–1.2)
Total Protein: 6.6 g/dL (ref 6.5–8.1)

## 2019-03-25 LAB — CBC WITH DIFFERENTIAL (CANCER CENTER ONLY)
Abs Immature Granulocytes: 0.01 10*3/uL (ref 0.00–0.07)
Basophils Absolute: 0.1 10*3/uL (ref 0.0–0.1)
Basophils Relative: 1 %
Eosinophils Absolute: 0.2 10*3/uL (ref 0.0–0.5)
Eosinophils Relative: 2 %
HCT: 39.6 % (ref 36.0–46.0)
Hemoglobin: 12.1 g/dL (ref 12.0–15.0)
Immature Granulocytes: 0 %
Lymphocytes Relative: 33 %
Lymphs Abs: 2.9 10*3/uL (ref 0.7–4.0)
MCH: 27.9 pg (ref 26.0–34.0)
MCHC: 30.6 g/dL (ref 30.0–36.0)
MCV: 91.5 fL (ref 80.0–100.0)
Monocytes Absolute: 0.8 10*3/uL (ref 0.1–1.0)
Monocytes Relative: 9 %
Neutro Abs: 4.8 10*3/uL (ref 1.7–7.7)
Neutrophils Relative %: 55 %
Platelet Count: 329 10*3/uL (ref 150–400)
RBC: 4.33 MIL/uL (ref 3.87–5.11)
RDW: 13.7 % (ref 11.5–15.5)
WBC Count: 8.7 10*3/uL (ref 4.0–10.5)
nRBC: 0 % (ref 0.0–0.2)

## 2019-03-25 MED ORDER — PROCHLORPERAZINE MALEATE 10 MG PO TABS: 10.0000 mg | ORAL_TABLET | Freq: Four times a day (QID) | ORAL | 0 refills | Status: DC | PRN

## 2019-03-25 NOTE — Progress Notes (Signed)
The proposed treatment discussed in cancer conference 03/25/2019 is for discussion only and is not a binding recommendation.  The patient was not physically examined nor present for their treatment options.  Therefore, final treatment plans cannot be decided.

## 2019-03-25 NOTE — Progress Notes (Signed)
Small Cell Lung - No Medical Intervention - Off Treatment.  Patient Characteristics: Newly Diagnosed, Preoperative or Nonsurgical Candidate (Clinical Staging), First Line, Limited Stage, Surgical Candidate Therapeutic Status: Newly Diagnosed, Preoperative or Nonsurgical Candidate (Clinical Staging) AJCC T Category: cT1b AJCC N Category: cN0 AJCC M Category: cM0 AJCC 8 Stage Grouping: IA2 Stage Classification: Limited Surgical Candidacy: Surgical Candidate

## 2019-03-25 NOTE — Progress Notes (Signed)
Oncology Nurse Navigator Documentation  Oncology Nurse Navigator Flowsheets 03/25/2019  Navigator Location [facility]  Referral date to RadOnc/MedOnc -  Navigator Encounter Type Clinic/MDC/I spoke with patient today at clinic.  I gave and explained information on lung cancer DX and treatment.  I also provided resources for support while receiving treatment.  Barriers identified at this time: Education, materials and verbal information provided.   Telephone -  [place] [employer] Needs Education  Education Contractor;Other  Interventions Education  Coordination of Care -  Education Method Verbal;Written  Acuity Level 2  Time Spent with Patient 30

## 2019-03-25 NOTE — Progress Notes (Signed)
[place] [phone]   [phone] Multidisciplinary thoracic oncology clinic CONSULT NOTE  REFERRING PHYSICIAN: Dr. Modesto Charon  REASON FOR CONSULTATION:  68 years old white female recently diagnosed with lung cancer.  HPI Tonya Bradley is a 68 y.o. female with past medical history significant for coronary artery disease, COPD, skin cancer, dyslipidemia, GERD, macular degeneration, depression, anxiety, anemia and long history of smoking.  The patient was seen by her pulmonologist for evaluation of COPD and it was recommended for her to undergo CT scanning of her lung.  The scan was performed on January 04, 2019 and it showed 2 adjacent versus 1 bilobed right upper lobe pulmonary nodule/nodules that is new.  The more lateral portion measures 9.9 mm and the more medial portion measures 12.6 mm.  The patient had a PET scan performed on January 15, 2019 and that showed the new bilobed nodule versus 2 adjacent nodules identified in the right upper lobe on the recent CT lung screening showed no definite hypermetabolism.  There was no evidence for hypermetabolic metastasis of the right hilum or mediastinum.  There was no additional unexpected hypermetabolic disease in the neck, chest, abdomen or pelvis. The patient was referred to Dr. [name] and on January 31, 2019 she underwent right VATS with wedge resection of the right upper lobe nodule as well as right upper and middle lobectomy with lymph node dissection. 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  There was no evidence for visceropleural invasion or lymphovascular invasion.  And the resection margins were negative for malignancy.  The dissected lymph nodes were negative for malignancy. Dr. [name] kindly referred the patient to the multidisciplinary thoracic oncology clinic today for evaluation and recommendation regarding treatment of her  condition. When seen today the patient is feeling fine with no concerning complaints except for depression and anxiety about her condition.  She continues to have right-sided chest pain at the surgical scar as well as shortness of breath on exertion she is currently on home oxygen as needed basis.  She denied having any cough or hemoptysis.  She has intermittent diarrhea and constipation.  She is currently on Linzess.  She denied having any headache but she has blurry vision. Family history significant for mother with a stroke, father had heart disease and sister had breast cancer. The patient is married and has 2 sons and 1 daughter.  She is currently retired and used to work in Therapist, art for [employer].  She has a history for smoking up to 2 pack/day for around 2 years and quit in March 2020.  She has no history of alcohol or drug abuse.  HPI  Past Medical History:  Diagnosis Date  . Anemia   . Anxiety   . Arthritis    "back"  . Atrial flutter ([place]) 03/13/2016  . CAD (coronary artery disease)   . Cancer ([place])    skin cancer  . Cataract   . Colon polyp   . Complication of anesthesia    BP dropped during esophagus diltation in 2017-admitted for 3 days.   Marland Kitchen COPD (chronic obstructive pulmonary disease) ([place])   . Depression   . Dysrhythmia   . Emphysema of lung ([place])   . GERD (gastroesophageal reflux disease)   . Hypercholesterolemia   . Osteoporosis   . Oxygen deficiency     Past Surgical History:  Procedure Laterality Date  . A-FLUTTER ABLATION N/A 05/19/2017  Procedure: A-Flutter Ablation;  Surgeon: Evans Lance, MD;  Location: [facility];  Service: Cardiovascular;  Laterality: N/A;  . APPENDECTOMY    . BASAL CELL CARCINOMA EXCISION    . CARDIAC CATHETERIZATION    . CATARACT EXTRACTION Bilateral   . CHOLECYSTECTOMY    . COLONOSCOPY N/A 06/23/2014   Dr. Rourk:Rectal and colonic polyps-removed, Pancolonic diverticulosis. lymphocytic colitis  . CORONARY  ANGIOPLASTY WITH STENT PLACEMENT    . ESOPHAGEAL DILATION N/A 03/13/2016   Procedure: ESOPHAGEAL DILATION;  Surgeon: Daneil Dolin, MD;  [facility];  Service: Endoscopy;  Laterality: N/A;  . ESOPHAGOGASTRODUODENOSCOPY N/A 03/13/2016   Procedure: ESOPHAGOGASTRODUODENOSCOPY (EGD);  Surgeon: Daneil Dolin, MD;  [facility];  Service: Endoscopy;  Laterality: N/A;  0930  . EYE SURGERY     cataract removed, bilateral  . IR THORACENTESIS ASP PLEURAL SPACE W/IMG GUIDE  02/15/2019  . LUMBAR LAMINECTOMY/DECOMPRESSION MICRODISCECTOMY N/A 01/19/2018   Procedure: L4-5 DECOMPRESSION;  Surgeon: Marybelle Killings, MD;  Location: [facility];  Service: Orthopedics;  Laterality: N/A;  . VIDEO ASSISTED THORACOSCOPY (VATS)/ LOBECTOMY Right 02/04/2019   Procedure: VIDEO ASSISTED THORACOSCOPY (VATS)/RIGHT UPPER AND MIDDLE LOBECTOMY;  Surgeon: Melrose Nakayama, MD;  Location: [facility];  Service: Thoracic;  Laterality: Right;  Marland Kitchen VIDEO BRONCHOSCOPY N/A 02/14/2019   Procedure: BRONCHOSCOPY WITH SEDATION;  Surgeon: Melrose Nakayama, MD;  Location: [facility] OR;  Service: Thoracic;  Laterality: N/A;    Family History  Problem Relation Age of Onset  . Breast cancer Sister 15  . Heart disease Sister   . Heart attack Father   . Heart disease Father   . Stroke Mother   . Heart disease Mother   . Hyperlipidemia Mother   . Heart attack Sister   . Heart disease Brother        ?heart failure  . Congestive Heart Failure Brother   . Hypertension Son   . Heart disease Maternal Grandfather   . Cancer Cousin        lymphoma  . Hypertension Son   . Stomach cancer Other   . Colon cancer Neg Hx   . Pancreatic cancer Neg Hx     Social History Social History   Tobacco Use  . Smoking status: Current Every Day Smoker    Packs/day: 0.25    Years: 54.00    Pack years: 13.50    Types: Cigarettes    Start date: 06/14/1966  . Smokeless tobacco: Never Used  . Tobacco comment: 1-2 packs daily. Chantix too  expensive; pt states that she quit 2 weeks ago (02/04/19)   Substance Use Topics  . Alcohol use: No    Alcohol/week: 0.0 standard drinks  . Drug use: No    No Known Allergies  Current Outpatient Medications  Medication Sig Dispense Refill  . ALPRAZolam (XANAX) 0.5 MG tablet Take 1 tablet (0.5 mg total) by mouth 2 (two) times daily as needed for anxiety. (Patient taking differently: Take 0.5 mg by mouth 2 (two) times daily. ) 60 tablet 1  . amiodarone (PACERONE) 200 MG tablet Take 1/2 tablet (100 mg) by mouth once daily (Patient taking differently: Take 100 mg by mouth daily. Take 1/2 tablet (100 mg) by mouth once daily) 90 tablet 3  . aspirin EC 81 MG tablet Take 81 mg by mouth daily.    Marland Kitchen atorvastatin (LIPITOR) 80 MG tablet TAKE 1 TABLET(80 MG) BY MOUTH DAILY (Patient taking differently: Take 80 mg by mouth daily. ) 90  tablet 1  . Cholecalciferol (VITAMIN D) 2000 units CAPS Take 2,000 Units by mouth daily.    . cycloSPORINE (RESTASIS) 0.05 % ophthalmic emulsion Place 1 drop into both eyes 2 (two) times daily.    Marland Kitchen Dexlansoprazole (DEXILANT) 30 MG capsule Take 1 capsule (30 mg total) by mouth daily. 90 capsule 3  . diltiazem (CARDIZEM CD) 120 MG 24 hr capsule TAKE 1 CAPSULE(120 MG) BY MOUTH DAILY (Patient taking differently: Take 120 mg by mouth daily. ) 90 capsule 2  . doxepin (SINEQUAN) 10 MG capsule Take 20 mg by mouth at bedtime.    . DULoxetine (CYMBALTA) 60 MG capsule Take 1 capsule (60 mg total) by mouth daily. (Patient taking differently: Take 60 mg by mouth at bedtime. ) 90 capsule 1  . furosemide (LASIX) 20 MG tablet TAKE 1 TABLET BY MOUTH THREE TIMES DAILY 270 tablet 0  . guaiFENesin (MUCINEX) 600 MG 12 hr tablet Take 2 tablets (1,200 mg total) by mouth 2 (two) times daily. For 7 days, then use as needed 60 tablet 1  . linaclotide (LINZESS) 290 MCG CAPS capsule Take 1 capsule (290 mcg total) by mouth daily as needed (constipation.). 30 capsule 0  . metoprolol tartrate (LOPRESSOR) 25  MG tablet Take 1 tablet (25 mg total) by mouth 2 (two) times daily. 60 tablet 3  . Multiple Vitamin (MULTIVITAMIN WITH MINERALS) TABS tablet Take 1 tablet by mouth daily.    . nitroGLYCERIN (NITROSTAT) 0.4 MG SL tablet Place 1 tablet (0.4 mg total) under the tongue every 5 (five) minutes as needed for chest pain. Reported on 06/12/2016 30 tablet 2  . ondansetron (ZOFRAN) 4 MG tablet Take 1 tablet (4 mg total) by mouth 2 (two) times daily as needed for nausea or vomiting. 40 tablet 0  . oxyCODONE (OXY IR/ROXICODONE) 5 MG immediate release tablet Take 1-2 tablets (5-10 mg total) by mouth every 8 (eight) hours as needed for severe pain. 20 tablet 0  . potassium chloride (K-DUR) 10 MEQ tablet TAKE 1 TABLET(10 MEQ) BY MOUTH DAILY (Patient taking differently: Take 10 mEq by mouth daily. ) 90 tablet 2  . rOPINIRole (REQUIP) 0.25 MG tablet TAKE 2 TABLETS(0.5 MG) BY MOUTH AT BEDTIME 180 tablet 0  . TRELEGY ELLIPTA 100-62.5-25 MCG/INH AEPB INHALE 1 PUFF INTO THE LUNGS DAILY (Patient taking differently: Inhale 1 puff into the lungs daily. ) 60 each 3  . VENTOLIN HFA 108 (90 Base) MCG/ACT inhaler INHALE 2 PUFFS INTO THE LUNGS EVERY 6 HOURS AS NEEDED FOR WHEEZING OR SHORTNESS OF BREATH (Patient taking differently: Inhale 2 puffs into the lungs every 6 (six) hours as needed (wheezing/shortness of breath). ) 18 g 0   No current facility-administered medications for this visit.     Review of Systems  Constitutional: positive for fatigue Eyes: negative Ears, nose, mouth, throat, and face: negative Respiratory: positive for dyspnea on exertion and pleurisy/chest pain Cardiovascular: negative Gastrointestinal: positive for constipation and diarrhea Genitourinary:negative Integument/breast: negative Hematologic/lymphatic: negative Musculoskeletal:negative Neurological: negative Behavioral/Psych: positive for anxiety and depression Endocrine: negative Allergic/Immunologic: negative  Physical Exam   BTD:VVOHY, healthy, no distress, well nourished, well developed and anxious SKIN: skin color, texture, turgor are normal, no rashes or significant lesions HEAD: Normocephalic, No masses, lesions, tenderness or abnormalities EYES: normal, PERRLA, Conjunctiva are pink and non-injected EARS: External ears normal, Canals clear OROPHARYNX:no exudate, no erythema and lips, buccal mucosa, and tongue normal  NECK: supple, no adenopathy, no JVD LYMPH:  no palpable lymphadenopathy, no hepatosplenomegaly BREAST:not examined LUNGS:  clear to auscultation , and palpation HEART: regular rate & rhythm, no murmurs and no gallops ABDOMEN:abdomen soft, non-tender, normal bowel sounds and no masses or organomegaly BACK: No CVA tenderness, Range of motion is normal EXTREMITIES:no joint deformities, effusion, or inflammation, no edema  NEURO: alert & oriented x 3 with fluent speech, no focal motor/sensory deficits  PERFORMANCE STATUS: ECOG 1  LABORATORY DATA: Lab Results  Component Value Date   WBC 11.0 (H) 02/13/2019   HGB 9.6 (L) 02/13/2019   HCT 30.2 (L) 02/13/2019   MCV 90.7 02/13/2019   PLT 410 (H) 02/13/2019      Chemistry      Component Value Date/Time   NA 139 02/13/2019 0452   NA 137 05/12/2017 1142   K 3.0 (L) 02/13/2019 0452   CL 94 (L) 02/13/2019 0452   CO2 31 02/13/2019 0452   BUN 10 02/13/2019 0452   BUN 18 05/12/2017 1142   CREATININE 0.54 02/13/2019 0452   CREATININE 0.66 06/19/2018 1554      Component Value Date/Time   CALCIUM 9.1 02/13/2019 0452   ALKPHOS 113 02/06/2019 0408   AST 54 (H) 02/06/2019 0408   ALT 76 (H) 02/06/2019 0408   BILITOT 0.6 02/06/2019 0408       RADIOGRAPHIC STUDIES: Dg Chest 2 View  Result Date: 03/09/2019 CLINICAL DATA:  Chest pain EXAM: CHEST - 2 VIEW COMPARISON:  02/18/2019 FINDINGS: Cardiac shadows within normal limits. Aortic calcifications are noted. Left lung is well aerated without focal infiltrate or sizable effusion. Elevation of the  right hemidiaphragm is again noted with postsurgical scarring at the base. No acute bony abnormality is noted. IMPRESSION: Postsurgical changes on the right with elevation of the right hemidiaphragm and focal scarring in the base. No acute abnormality is noted. Electronically Signed   By: Inez Catalina M.D.   On: 03/09/2019 13:59    ASSESSMENT: This is a very pleasant 68 years old white female with a limited stage, stage Ia (T1b, N0, M0) small cell lung cancer status post right upper and middle bilobectomy with lymph node dissection under the care of Dr. [name] January 31, 2019.   PLAN: I had a lengthy discussion with the patient today about her current condition, prognosis and treatment options. I personally and independently reviewed the scans as well as the pathology report. I recommended for the patient to complete the staging work-up by ordering MRI of the brain to rule out brain metastasis. I also discussed with the patient adjuvant treatment options and recommended for her a course of adjuvant systemic chemotherapy with cisplatin 80 mg/M2 on day 1 and etoposide 100 mg/M2 on days 1, 2 and 3 every 3 weeks. I discussed with the patient the adverse effect of this treatment including but not limited to alopecia, myelosuppression, nausea and vomiting, peripheral neuropathy, liver or renal dysfunction. The patient is interested in proceeding with the treatment and she is expected to start the first cycle of this treatment on Apr 12, 2019. I will arrange for the patient to have a chemotherapy education class before the first dose of her treatment. I will also call her pharmacy with prescription for Compazine 10 mg p.o. every 6 hours as needed for nausea. The patient will come back for follow-up visit 1 week after the first dose of her treatment for management of any adverse effect of her treatment. She was advised to call immediately if she has any concerning symptoms in the interval. The patient  voices understanding of current disease status  and treatment options and is in agreement with the current care plan.  All questions were answered. The patient knows to call the clinic with any problems, questions or concerns. We can certainly see the patient much sooner if necessary.  Thank you so much for allowing me to participate in the care of [place]. I will continue to follow up the patient with you and assist in her care.  I spent 55 minutes counseling the patient face to face. The total time spent in the appointment was 80 minutes.  Disclaimer: This note was dictated with voice recognition software. Similar sounding words can inadvertently be transcribed and may not be corrected upon review.   Eilleen Kempf March 25, 2019, 1:52 PM

## 2019-03-25 NOTE — Progress Notes (Signed)
DISCONTINUE ON PATHWAY REGIMEN - Small Cell Lung  No Medical Intervention - Off Treatment.  REASON: Other Reason PRIOR TREATMENT: Lung300: Referral to Surgery  START ON PATHWAY REGIMEN - Small Cell Lung     A cycle is every 21 days:     Etoposide      Cisplatin   **Always confirm dose/schedule in your pharmacy ordering system**  Patient Characteristics: Postoperative (Pathologic Staging), Adjuvant Therapy Therapeutic Status: Postoperative (Pathologic Staging) AJCC T Category: pT1b AJCC N Category: pN0 AJCC M Category: cM0 AJCC 8 Stage Grouping: IA2 Intent of Therapy: Curative Intent, Discussed with Patient

## 2019-03-26 ENCOUNTER — Telehealth: Payer: Self-pay | Admitting: Internal Medicine

## 2019-03-26 DIAGNOSIS — M48061 Spinal stenosis, lumbar region without neurogenic claudication: Secondary | ICD-10-CM | POA: Diagnosis not present

## 2019-03-26 DIAGNOSIS — I2510 Atherosclerotic heart disease of native coronary artery without angina pectoris: Secondary | ICD-10-CM | POA: Diagnosis not present

## 2019-03-26 DIAGNOSIS — G8929 Other chronic pain: Secondary | ICD-10-CM | POA: Diagnosis not present

## 2019-03-26 DIAGNOSIS — Z483 Aftercare following surgery for neoplasm: Secondary | ICD-10-CM | POA: Diagnosis not present

## 2019-03-26 DIAGNOSIS — J9611 Chronic respiratory failure with hypoxia: Secondary | ICD-10-CM | POA: Diagnosis not present

## 2019-03-26 DIAGNOSIS — J438 Other emphysema: Secondary | ICD-10-CM | POA: Diagnosis not present

## 2019-03-26 DIAGNOSIS — I4892 Unspecified atrial flutter: Secondary | ICD-10-CM | POA: Diagnosis not present

## 2019-03-26 DIAGNOSIS — C3491 Malignant neoplasm of unspecified part of right bronchus or lung: Secondary | ICD-10-CM | POA: Diagnosis not present

## 2019-03-26 DIAGNOSIS — I83893 Varicose veins of bilateral lower extremities with other complications: Secondary | ICD-10-CM | POA: Diagnosis not present

## 2019-03-26 NOTE — Telephone Encounter (Signed)
Tried to reach regarding schedule. I could not start on 5/11 per 4/23 los patient has appointment that day blocking

## 2019-03-27 ENCOUNTER — Other Ambulatory Visit: Payer: Self-pay | Admitting: Internal Medicine

## 2019-03-29 ENCOUNTER — Telehealth: Payer: Self-pay | Admitting: *Deleted

## 2019-03-29 NOTE — Telephone Encounter (Signed)
I was updated that MRI brain has authorized.  I called GI and was able to schedule.  I called and updated patient's husband.  He verbalized understanding of appt time and place.

## 2019-03-29 NOTE — Telephone Encounter (Signed)
Oncology Nurse Navigator Documentation  Oncology Nurse Navigator Flowsheets 03/29/2019  Navigator Location [facility]  Referral date to RadOnc/MedOnc -  Navigator Encounter Type Telephone/I received a call from Tonya Bradley.  I called her back.  She had questions about her treatment and I clarified.  She was checking on brain mri and I contacted authorization coordinator to get scan authorized so it can get scheduled.   Telephone Outgoing Call  Barriers/Navigation Needs Education;Coordination of Care  Education Other  Interventions Coordination of Care;Education  Coordination of Care Other  Education Method Verbal  Acuity Level 2  Time Spent with Patient 30

## 2019-03-30 DIAGNOSIS — I2510 Atherosclerotic heart disease of native coronary artery without angina pectoris: Secondary | ICD-10-CM | POA: Diagnosis not present

## 2019-03-30 DIAGNOSIS — M48061 Spinal stenosis, lumbar region without neurogenic claudication: Secondary | ICD-10-CM | POA: Diagnosis not present

## 2019-03-30 DIAGNOSIS — J9611 Chronic respiratory failure with hypoxia: Secondary | ICD-10-CM | POA: Diagnosis not present

## 2019-03-30 DIAGNOSIS — G8929 Other chronic pain: Secondary | ICD-10-CM | POA: Diagnosis not present

## 2019-03-30 DIAGNOSIS — I83893 Varicose veins of bilateral lower extremities with other complications: Secondary | ICD-10-CM | POA: Diagnosis not present

## 2019-03-30 DIAGNOSIS — C3491 Malignant neoplasm of unspecified part of right bronchus or lung: Secondary | ICD-10-CM | POA: Diagnosis not present

## 2019-03-30 DIAGNOSIS — I4892 Unspecified atrial flutter: Secondary | ICD-10-CM | POA: Diagnosis not present

## 2019-03-30 DIAGNOSIS — Z483 Aftercare following surgery for neoplasm: Secondary | ICD-10-CM | POA: Diagnosis not present

## 2019-03-30 DIAGNOSIS — J438 Other emphysema: Secondary | ICD-10-CM | POA: Diagnosis not present

## 2019-04-05 ENCOUNTER — Other Ambulatory Visit: Payer: Self-pay

## 2019-04-05 ENCOUNTER — Other Ambulatory Visit: Payer: Self-pay | Admitting: Family Medicine

## 2019-04-05 ENCOUNTER — Ambulatory Visit
Admission: RE | Admit: 2019-04-05 | Discharge: 2019-04-05 | Disposition: A | Discharge status: Home / Self Care | Payer: Medicare HMO | Source: Ambulatory Visit | Attending: Internal Medicine | Admitting: Internal Medicine

## 2019-04-05 DIAGNOSIS — C3491 Malignant neoplasm of unspecified part of right bronchus or lung: Secondary | ICD-10-CM | POA: Diagnosis not present

## 2019-04-05 MED ORDER — GADOBENATE DIMEGLUMINE 529 MG/ML IV SOLN
13.0000 mL | Freq: Once | INTRAVENOUS | Status: AC | PRN
  Administered 2019-04-05: 13 mL via INTRAVENOUS

## 2019-04-06 ENCOUNTER — Telehealth: Payer: Self-pay

## 2019-04-06 ENCOUNTER — Telehealth: Payer: Self-pay | Admitting: Medical Oncology

## 2019-04-06 NOTE — Telephone Encounter (Signed)
Copied from [place] [id number]. Topic: General - Other >> Apr 06, 2019 12:26 PM Mcneil, Ja-Kwan wrote: Reason for CRM: Pt requests a medication that is a little stronger than the doxepin (SINEQUAN) 10 MG capsule to sent to her pharmacy because she is not able to sleep.

## 2019-04-06 NOTE — Telephone Encounter (Signed)
Wants results of MRI.Per [facility] MRI brain -no evidence of metastatic disease.

## 2019-04-06 NOTE — Telephone Encounter (Signed)
Message sent to Dr. Jordan for review and approval. 

## 2019-04-07 ENCOUNTER — Other Ambulatory Visit: Payer: Self-pay | Admitting: Thoracic Surgery (Cardiothoracic Vascular Surgery)

## 2019-04-07 DIAGNOSIS — J449 Chronic obstructive pulmonary disease, unspecified: Secondary | ICD-10-CM | POA: Diagnosis not present

## 2019-04-07 MED ORDER — OXYCODONE HCL 5 MG PO TABS: 5.0000 mg | ORAL_TABLET | Freq: Three times a day (TID) | ORAL | 0 refills | Status: DC | PRN

## 2019-04-07 NOTE — Progress Notes (Signed)
Reordered oxycodone, decrease to 5 mg PO every 8 hours PRN, 20 tablets, 0 refill  Remo Lipps C. [name], MD [facility] [phone]

## 2019-04-07 NOTE — Telephone Encounter (Signed)
Pt called to check on RX

## 2019-04-08 ENCOUNTER — Inpatient Hospital Stay: Payer: Medicare HMO | Attending: Internal Medicine

## 2019-04-08 DIAGNOSIS — C3411 Malignant neoplasm of upper lobe, right bronchus or lung: Secondary | ICD-10-CM | POA: Insufficient documentation

## 2019-04-08 DIAGNOSIS — Z5189 Encounter for other specified aftercare: Secondary | ICD-10-CM | POA: Insufficient documentation

## 2019-04-08 DIAGNOSIS — Z5111 Encounter for antineoplastic chemotherapy: Secondary | ICD-10-CM | POA: Insufficient documentation

## 2019-04-08 NOTE — Telephone Encounter (Addendum)
Reviewed patient chart. This medication was prescribed by a historical provider. Will forward to Dr. Martinique for her approval. Patient notified and would like a response by tomorrow so she can have something for the weekend.

## 2019-04-08 NOTE — Telephone Encounter (Signed)
Patient calling to check status of RX. She is requesting a call back asap.

## 2019-04-09 ENCOUNTER — Other Ambulatory Visit: Payer: Self-pay

## 2019-04-09 ENCOUNTER — Other Ambulatory Visit: Payer: Self-pay | Admitting: Family Medicine

## 2019-04-09 ENCOUNTER — Other Ambulatory Visit: Payer: Self-pay | Admitting: Thoracic Surgery (Cardiothoracic Vascular Surgery)

## 2019-04-09 DIAGNOSIS — C3491 Malignant neoplasm of unspecified part of right bronchus or lung: Secondary | ICD-10-CM

## 2019-04-09 MED ORDER — QUETIAPINE FUMARATE 100 MG PO TABS: 100.0000 mg | ORAL_TABLET | Freq: Every day | ORAL | 1 refills | Status: DC

## 2019-04-09 NOTE — Telephone Encounter (Signed)
Prescription for Seroquel 100 mg tablet was sent to her pharmacy, she can start with half tablet at bedtime. Stop doxepin, please call to the pharmacy and cancel prescription. Follow-up in 3 to 4 weeks.  Thanks, BJ

## 2019-04-09 NOTE — Telephone Encounter (Signed)
Pt calling to follow up on med request. She needs something stronger, as she has chemo starting next Tues and needs something to help her sleep.  [facility] [place], [place] AT [facility] OF [place] [phone] (Phone) [phone] (Fax)

## 2019-04-09 NOTE — Telephone Encounter (Signed)
Spoke with patient and gave directions per Dr. Martinique. Patient verbalized understanding.Rx for Doxepin cancelled at the pharmacy.

## 2019-04-12 ENCOUNTER — Other Ambulatory Visit: Payer: Self-pay

## 2019-04-12 ENCOUNTER — Ambulatory Visit (INDEPENDENT_AMBULATORY_CARE_PROVIDER_SITE_OTHER): Payer: Medicare HMO | Admitting: Physician Assistant

## 2019-04-12 ENCOUNTER — Encounter: Payer: Self-pay | Admitting: Physician Assistant

## 2019-04-12 ENCOUNTER — Ambulatory Visit
Admission: RE | Admit: 2019-04-12 | Discharge: 2019-04-12 | Disposition: A | Discharge status: Home / Self Care | Payer: Medicare HMO | Source: Ambulatory Visit | Attending: Thoracic Surgery (Cardiothoracic Vascular Surgery) | Admitting: Thoracic Surgery (Cardiothoracic Vascular Surgery)

## 2019-04-12 VITALS — BP 110/74 | HR 62 | Temp 97.7°F | Resp 16 | Ht 62.0 in | Wt 142.0 lb

## 2019-04-12 DIAGNOSIS — C3491 Malignant neoplasm of unspecified part of right bronchus or lung: Secondary | ICD-10-CM

## 2019-04-12 DIAGNOSIS — Z902 Acquired absence of lung [part of]: Secondary | ICD-10-CM

## 2019-04-12 NOTE — Progress Notes (Signed)
[address]       [place], [address]             [phone]     Tonya Bradley is a 68 y.o. female patient with a history of tobacco abuse, COPD, atrial flutter status post ablation, atrial fibrillation, coronary artery disease, hyperlipidemia, reflux, osteoporosis, depression, anxiety, and arthritis.  She recently was found to have a lung nodule on a low-dose screening CT.  There was low-level hypermetabolism on PET CT. Dr. [name] performed a  Thoracoscopic right upper and middle  lobectomy on 02/04/2019.  She had problems with mucous plugging postoperatively and required bronchoscopy on 3/15 to clear the plugs.  She ultimately went home on 02/18/2019. Dr. [name] spoke with the patient over the phone about a month ago and at that time she was having some generalized fatigue where some days it was difficult to get out of bed. She did have some pre-op depression which may be contributing to her symptoms. Today, from a surgical standpoint her incisions are healing well and she is progressing slowly.   1. Small cell lung cancer, right ([place])   2. S/P lobectomy of lung    Past Medical History:  Diagnosis Date  . Anemia   . Anxiety   . Arthritis    "back"  . Atrial flutter ([place]) 03/13/2016  . CAD (coronary artery disease)   . Cancer ([place])    skin cancer  . Cataract   . Colon polyp   . Complication of anesthesia    BP dropped during esophagus diltation in 2017-admitted for 3 days.   Marland Kitchen COPD (chronic obstructive pulmonary disease) ([place])   . Depression   . Dysrhythmia   . Emphysema of lung ([place])   . GERD (gastroesophageal reflux disease)   . Hypercholesterolemia   . Osteoporosis   . Oxygen deficiency    Past Surgical History Pertinent Negatives:  Procedure Date Noted  . POLYPECTOMY 06/07/2014   Scheduled Meds: Current Outpatient Medications on File Prior to Visit  Medication Sig Dispense Refill  . ALPRAZolam (XANAX) 0.5 MG tablet Take 1 tablet (0.5 mg  total) by mouth 2 (two) times daily as needed for anxiety. (Patient taking differently: Take 0.5 mg by mouth 2 (two) times daily. ) 60 tablet 1  . amiodarone (PACERONE) 200 MG tablet TAKE 1/2 TABLET BY MOUTH ONCE DAILY 90 tablet 3  . aspirin EC 81 MG tablet Take 81 mg by mouth daily.    Marland Kitchen atorvastatin (LIPITOR) 80 MG tablet TAKE 1 TABLET(80 MG) BY MOUTH DAILY (Patient taking differently: Take 80 mg by mouth daily. ) 90 tablet 1  . Cholecalciferol (VITAMIN D) 2000 units CAPS Take 2,000 Units by mouth daily.    . cycloSPORINE (RESTASIS) 0.05 % ophthalmic emulsion Place 1 drop into both eyes 2 (two) times daily.    Marland Kitchen Dexlansoprazole (DEXILANT) 30 MG capsule Take 1 capsule (30 mg total) by mouth daily. 90 capsule 3  . diltiazem (CARDIZEM CD) 120 MG 24 hr capsule TAKE 1 CAPSULE(120 MG) BY MOUTH DAILY (Patient taking differently: Take 120 mg by mouth daily. ) 90 capsule 2  . DULoxetine (CYMBALTA) 60 MG capsule Take 1 capsule (60 mg total) by mouth daily. (Patient taking differently: Take 60 mg by mouth at bedtime. ) 90 capsule 1  . furosemide (LASIX) 20 MG tablet TAKE 1 TABLET BY MOUTH THREE TIMES DAILY 270 tablet 0  . guaiFENesin (MUCINEX) 600 MG 12 hr tablet Take 2 tablets (  1,200 mg total) by mouth 2 (two) times daily. For 7 days, then use as needed 60 tablet 1  . linaclotide (LINZESS) 290 MCG CAPS capsule Take 1 capsule (290 mcg total) by mouth daily as needed (constipation.). 30 capsule 0  . metoprolol tartrate (LOPRESSOR) 25 MG tablet Take 1 tablet (25 mg total) by mouth 2 (two) times daily. 60 tablet 3  . Multiple Vitamin (MULTIVITAMIN WITH MINERALS) TABS tablet Take 1 tablet by mouth daily.    . nitroGLYCERIN (NITROSTAT) 0.4 MG SL tablet Place 1 tablet (0.4 mg total) under the tongue every 5 (five) minutes as needed for chest pain. Reported on 06/12/2016 30 tablet 2  . ondansetron (ZOFRAN) 4 MG tablet Take 1 tablet (4 mg total) by mouth 2 (two) times daily as needed for nausea or vomiting. 40 tablet 0   . oxyCODONE (OXY IR/ROXICODONE) 5 MG immediate release tablet Take 1 tablet (5 mg total) by mouth every 8 (eight) hours as needed for severe pain. 20 tablet 0  . potassium chloride (K-DUR) 10 MEQ tablet TAKE 1 TABLET(10 MEQ) BY MOUTH DAILY (Patient taking differently: Take 10 mEq by mouth daily. ) 90 tablet 2  . prochlorperazine (COMPAZINE) 10 MG tablet Take 1 tablet (10 mg total) by mouth every 6 (six) hours as needed for nausea or vomiting. 30 tablet 0  . QUEtiapine (SEROQUEL) 100 MG tablet Take 1 tablet (100 mg total) by mouth at bedtime. 30 tablet 1  . rOPINIRole (REQUIP) 0.25 MG tablet TAKE 2 TABLETS(0.5 MG) BY MOUTH AT BEDTIME 180 tablet 0  . TRELEGY ELLIPTA 100-62.5-25 MCG/INH AEPB INHALE 1 PUFF INTO THE LUNGS DAILY (Patient taking differently: Inhale 1 puff into the lungs daily. ) 60 each 3  . VENTOLIN HFA 108 (90 Base) MCG/ACT inhaler INHALE 2 PUFFS INTO THE LUNGS EVERY 6 HOURS AS NEEDED FOR WHEEZING OR SHORTNESS OF BREATH (Patient taking differently: Inhale 2 puffs into the lungs every 6 (six) hours as needed (wheezing/shortness of breath). ) 18 g 0   No current facility-administered medications on file prior to visit.      Blood pressure 110/74, pulse 62, temperature 97.7 F (36.5 C), temperature source Skin, resp. rate 16, height 5\' 2"  (1.575 m), weight 142 lb (64.4 kg), SpO2 (!) 85 %. (no signs of respiratory distress)  Subjective She feels okay today. She does still have some fatigue and doesn't eat well. She is eating one meal a day and a snack before bed. She is a little anxious about starting chemotherapy tomorrow.   Objective   Cor: RRR, no murmur Pulm: diminished in the lower lobes bilaterally, no wheezing Abd: + bowel sounds, no tenderness Wound: well healed thoracotomy incision Ext: 1+ edema bilaterally   CLINICAL DATA:  Post right VATS 02/04/2019.  Small cell lung cancer.  EXAM: CHEST - 2 VIEW  COMPARISON:  01/04/2018  FINDINGS: Postoperative changes on  the right with volume loss. No confluent airspace opacities or significant effusions. Heart is normal size. No acute bony abnormality.  IMPRESSION: Postoperative changes on the right with volume loss. No active disease.   Electronically Signed   By: Rolm Baptise M.D.   On: 04/12/2019 12:52   Assessment & Plan   Tonya Bradley is a 68 y.o. female patient with a history of tobacco abuse, COPD, atrial flutter status post ablation, atrial fibrillation, coronary artery disease, hyperlipidemia, reflux, osteoporosis, depression, anxiety, and arthritis. Her main complaint is low blood pressure. She does think this might have something to do with  her fatigue. We discussed smoking cessation. She is still occasionally smoking. She is on several medications for her atrial fibrillation which include metoprolol, Cardizem, and Amio that could be contributing to a lower blood pressure. She was seeing Dr. Lovena Le for her atrial fibrillation before surgery but has not seen him after surgery. I will arrange at least a tele visit with his office for her to get her medications reviewed. Her blood pressure was recorded incorrectly and was actually 100/60 today per nursing. She is not having any symptoms such as dizziness or lightheadedness. Her only complaint is fatigue and lack of appetite which seems to be the case for the last several weeks. Her incisions are healing well and she looks good from a surgical perspective. I did schedule her an appointment with Dr. Lovena Le (virtual) for 5/20 at 10:30am. The patient was called and told the appointment time and date. I also scheduled an appointment for her with Dr. [name] in 1 month for follow-up. Her appointment with Dr. Rogue Jury is on 5/18. She did have some chemotherapy questions that I referred to Dr. Rogue Jury to answer/explain. She knows she starts chemotherapy tomorrow and is a little anxious about what to expect. Her chest xray ws reviewed and discussed with the  patient. All questions were answered to the patient's satisfaction. She knows about her appointments but is welcome to call our office with further questions or concerns.   No medication changes today. Discussed smoking cessation.   Elgie Collard 04/12/2019

## 2019-04-13 ENCOUNTER — Inpatient Hospital Stay: Payer: Medicare HMO

## 2019-04-13 ENCOUNTER — Other Ambulatory Visit: Payer: Self-pay | Admitting: Family Medicine

## 2019-04-13 ENCOUNTER — Encounter: Payer: Self-pay | Admitting: Internal Medicine

## 2019-04-13 VITALS — BP 130/65 | HR 72 | Temp 98.1°F | Resp 18

## 2019-04-13 DIAGNOSIS — I83813 Varicose veins of bilateral lower extremities with pain: Secondary | ICD-10-CM

## 2019-04-13 DIAGNOSIS — Z5189 Encounter for other specified aftercare: Secondary | ICD-10-CM | POA: Diagnosis not present

## 2019-04-13 DIAGNOSIS — C3491 Malignant neoplasm of unspecified part of right bronchus or lung: Secondary | ICD-10-CM

## 2019-04-13 DIAGNOSIS — Z5111 Encounter for antineoplastic chemotherapy: Secondary | ICD-10-CM | POA: Diagnosis not present

## 2019-04-13 DIAGNOSIS — C3411 Malignant neoplasm of upper lobe, right bronchus or lung: Secondary | ICD-10-CM | POA: Diagnosis not present

## 2019-04-13 LAB — CBC WITH DIFFERENTIAL (CANCER CENTER ONLY)
Abs Immature Granulocytes: 0.02 10*3/uL (ref 0.00–0.07)
Basophils Absolute: 0 10*3/uL (ref 0.0–0.1)
Basophils Relative: 1 %
Eosinophils Absolute: 0.3 10*3/uL (ref 0.0–0.5)
Eosinophils Relative: 4 %
HCT: 39.1 % (ref 36.0–46.0)
Hemoglobin: 11.9 g/dL — ABNORMAL LOW (ref 12.0–15.0)
Immature Granulocytes: 0 %
Lymphocytes Relative: 25 %
Lymphs Abs: 1.8 10*3/uL (ref 0.7–4.0)
MCH: 28.2 pg (ref 26.0–34.0)
MCHC: 30.4 g/dL (ref 30.0–36.0)
MCV: 92.7 fL (ref 80.0–100.0)
Monocytes Absolute: 0.6 10*3/uL (ref 0.1–1.0)
Monocytes Relative: 9 %
Neutro Abs: 4.4 10*3/uL (ref 1.7–7.7)
Neutrophils Relative %: 61 %
Platelet Count: 276 10*3/uL (ref 150–400)
RBC: 4.22 MIL/uL (ref 3.87–5.11)
RDW: 14.4 % (ref 11.5–15.5)
WBC Count: 7.1 10*3/uL (ref 4.0–10.5)
nRBC: 0 % (ref 0.0–0.2)

## 2019-04-13 LAB — MAGNESIUM: Magnesium: 1.9 mg/dL (ref 1.7–2.4)

## 2019-04-13 LAB — CMP (CANCER CENTER ONLY)
ALT: 16 U/L (ref 0–44)
AST: 15 U/L (ref 15–41)
Albumin: 3.1 g/dL — ABNORMAL LOW (ref 3.5–5.0)
Alkaline Phosphatase: 156 U/L — ABNORMAL HIGH (ref 38–126)
Anion gap: 9 (ref 5–15)
BUN: 10 mg/dL (ref 8–23)
CO2: 32 mmol/L (ref 22–32)
Calcium: 9.6 mg/dL (ref 8.9–10.3)
Chloride: 101 mmol/L (ref 98–111)
Creatinine: 0.58 mg/dL (ref 0.44–1.00)
GFR, Est AFR Am: >60 mL/min (ref >60)
GFR, Estimated: >60 mL/min (ref >60)
Glucose, Bld: 113 mg/dL — ABNORMAL HIGH (ref 70–99)
Potassium: 4.1 mmol/L (ref 3.5–5.1)
Sodium: 142 mmol/L (ref 135–145)
Total Bilirubin: 0.3 mg/dL (ref 0.3–1.2)
Total Protein: 6.4 g/dL — ABNORMAL LOW (ref 6.5–8.1)

## 2019-04-13 MED ORDER — POTASSIUM CHLORIDE 2 MEQ/ML IV SOLN
Freq: Once | INTRAVENOUS | Status: AC
  Administered 2019-04-13: 10:00:00 via INTRAVENOUS
  Filled 2019-04-13: qty 10

## 2019-04-13 MED ORDER — PALONOSETRON HCL INJECTION 0.25 MG/5ML
0.2500 mg | Freq: Once | INTRAVENOUS | Status: AC
  Administered 2019-04-13: 12:00:00 0.25 mg via INTRAVENOUS

## 2019-04-13 MED ORDER — SODIUM CHLORIDE 0.9 % IV SOLN
100.0000 mg/m2 | Freq: Once | INTRAVENOUS | Status: AC
  Administered 2019-04-13: 14:00:00 170 mg via INTRAVENOUS
  Filled 2019-04-13: qty 8.5

## 2019-04-13 MED ORDER — SODIUM CHLORIDE 0.9 % IV SOLN
Freq: Once | INTRAVENOUS | Status: AC
  Administered 2019-04-13: 09:00:00 via INTRAVENOUS
  Filled 2019-04-13: qty 250

## 2019-04-13 MED ORDER — SODIUM CHLORIDE 0.9 % IV SOLN
80.0000 mg/m2 | Freq: Once | INTRAVENOUS | Status: AC
  Administered 2019-04-13: 13:00:00 136 mg via INTRAVENOUS
  Filled 2019-04-13: qty 136

## 2019-04-13 MED ORDER — SODIUM CHLORIDE 0.9 % IV SOLN
Freq: Once | INTRAVENOUS | Status: AC
  Administered 2019-04-13: 12:00:00 via INTRAVENOUS
  Filled 2019-04-13: qty 5

## 2019-04-13 MED ORDER — PALONOSETRON HCL INJECTION 0.25 MG/5ML
INTRAVENOUS | Status: AC
  Filled 2019-04-13: qty 5

## 2019-04-13 NOTE — Progress Notes (Signed)
Met with pt in lobby this am to see if she had any questions or concerns about treatment.  She states she read all of her Education packet of information & doesn't have questions but is nervous today.  Reassured her that the nurses would take good care of her & explain things to her & to please ask any questions that she may have. She expressed understanding & appreciation.

## 2019-04-13 NOTE — Progress Notes (Signed)
Met w/ pt to introduce myself as her Arboriculturist.  Pt has 2 insurances so copay assistance shouldn't be needed.  I offered the [employer] and went over what it covers.  She declined wanting to leave it for someone in greater need.  She has my card in case she changes her mind and for any questions or concerns she may have in the future.

## 2019-04-13 NOTE — Patient Instructions (Signed)
Coronavirus (COVID-19) Are you at risk?  Are you at risk for the Coronavirus (COVID-19)?  To be considered HIGH RISK for Coronavirus (COVID-19), you have to meet the following criteria:  . Traveled to [place], [place], [place], [place] or [place]; or in the [place] to [place], [place], [place], or [place]; and have fever, cough, and shortness of breath within the last 2 weeks of travel OR . Been in close contact with a person diagnosed with COVID-19 within the last 2 weeks and have fever, cough, and shortness of breath . IF YOU DO NOT MEET THESE CRITERIA, YOU ARE CONSIDERED LOW RISK FOR COVID-19.  What to do if you are HIGH RISK for COVID-19?  . If you are having a medical emergency, call 911. . Seek medical care right away. Before you go to a doctor's office, urgent care or emergency department, call ahead and tell them about your recent travel, contact with someone diagnosed with COVID-19, and your symptoms. You should receive instructions from your physician's office regarding next steps of care.  . When you arrive at healthcare provider, tell the healthcare staff immediately you have returned from visiting [place], [place], [place], [place] or [place]; or traveled in the [place] to [place], [place], [place], or [place]; in the last two weeks or you have been in close contact with a person diagnosed with COVID-19 in the last 2 weeks.   . Tell the health care staff about your symptoms: fever, cough and shortness of breath. . After you have been seen by a medical provider, you will be either: o Tested for (COVID-19) and discharged home on quarantine except to seek medical care if symptoms worsen, and asked to  - Stay home and avoid contact with others until you get your results (4-5 days)  - Avoid travel on public transportation if possible (such as bus, train, or airplane) or o Sent to the Emergency Department by EMS for evaluation, COVID-19 testing, and possible  admission depending on your condition and test results.  What to do if you are LOW RISK for COVID-19?  Reduce your risk of any infection by using the same precautions used for avoiding the common cold or flu:  . Wash your hands often with soap and warm water for at least 20 seconds.  If soap and water are not readily available, use an alcohol-based hand sanitizer with at least 60% alcohol.  . If coughing or sneezing, cover your mouth and nose by coughing or sneezing into the elbow areas of your shirt or coat, into a tissue or into your sleeve (not your hands). . Avoid shaking hands with others and consider head nods or verbal greetings only. . Avoid touching your eyes, nose, or mouth with unwashed hands.  . Avoid close contact with people who are sick. . Avoid places or events with large numbers of people in one location, like concerts or sporting events. . Carefully consider travel plans you have or are making. . If you are planning any travel outside or inside the [place], visit the CDC's Travelers' Health webpage for the latest health notices. . If you have some symptoms but not all symptoms, continue to monitor at home and seek medical attention if your symptoms worsen. . If you are having a medical emergency, call 911.   ADDITIONAL HEALTHCARE OPTIONS FOR PATIENTS  [facility] / e-Visit: [email or web address]         [facility]: [phone]  [place]   [facility]: [facility]: [place] Discharge Instructions for Patients Receiving Chemotherapy  Today you received the following chemotherapy agents Cisplatin and Etoposide  To help prevent nausea and vomiting after your treatment, we encourage you to take your nausea medication as directed.    If you develop nausea and vomiting that is not controlled by your nausea medication, call the clinic.    BELOW ARE SYMPTOMS THAT SHOULD BE REPORTED IMMEDIATELY:  *FEVER GREATER THAN 100.5 F  *CHILLS WITH OR WITHOUT FEVER  NAUSEA AND VOMITING THAT IS NOT CONTROLLED WITH YOUR NAUSEA MEDICATION  *UNUSUAL SHORTNESS OF BREATH  *UNUSUAL BRUISING OR BLEEDING  TENDERNESS IN MOUTH AND THROAT WITH OR WITHOUT PRESENCE OF ULCERS  *URINARY PROBLEMS  *BOWEL PROBLEMS  UNUSUAL RASH Items with * indicate a potential emergency and should be followed up as soon as possible.  Feel free to call the clinic should you have any questions or concerns. The clinic phone number is [phone].  Please show the [place] at check-in to the Emergency Department and triage nurse.  Cisplatin injection What is this medicine? CISPLATIN (SIS pla tin) is a chemotherapy drug. It targets fast dividing cells, like cancer cells, and causes these cells to die. This medicine is used to treat many types of cancer like bladder, ovarian, and testicular cancers. This medicine may be used for other purposes; ask your health care provider or pharmacist if you have questions. COMMON BRAND NAME(S): Platinol, Platinol -AQ What should I tell my health care provider before I take this medicine? They need to know if you have any of these conditions: -blood disorders -hearing problems -kidney disease -recent or ongoing radiation therapy -an unusual or allergic reaction to cisplatin, carboplatin, other chemotherapy, other medicines, foods, dyes, or preservatives -pregnant or trying to get pregnant -breast-feeding How should I use this medicine? This drug is given as an infusion into a vein. It is administered in a hospital or clinic by a specially trained health care professional. Talk to your pediatrician regarding the use of this medicine in children. Special care may be needed. Overdosage: If you think you have taken too much of this medicine contact a poison control center or emergency room at once. NOTE: This  medicine is only for you. Do not share this medicine with others. What if I miss a dose? It is important not to miss a dose. Call your doctor or health care professional if you are unable to keep an appointment. What may interact with this medicine? -dofetilide -foscarnet -medicines for seizures -medicines to increase blood counts like filgrastim, pegfilgrastim, sargramostim -probenecid -pyridoxine used with altretamine -rituximab -some antibiotics like amikacin, gentamicin, neomycin, polymyxin B, streptomycin, tobramycin -sulfinpyrazone -vaccines -zalcitabine Talk to your doctor or health care professional before taking any of these medicines: -acetaminophen -aspirin -ibuprofen -ketoprofen -naproxen This list may not describe all possible interactions. Give your health care provider a list of all the medicines, herbs, non-prescription drugs, or dietary supplements you use. Also tell them if you smoke, drink alcohol, or use illegal drugs. Some items may interact with your medicine. What should I watch for while using this medicine? Your condition will be monitored carefully while you are receiving this medicine. You will need important blood work done while you are taking this medicine. This drug may make you feel generally unwell. This is not uncommon,  as chemotherapy can affect healthy cells as well as cancer cells. Report any side effects. Continue your course of treatment even though you feel ill unless your doctor tells you to stop. In some cases, you may be given additional medicines to help with side effects. Follow all directions for their use. Call your doctor or health care professional for advice if you get a fever, chills or sore throat, or other symptoms of a cold or flu. Do not treat yourself. This drug decreases your body's ability to fight infections. Try to avoid being around people who are sick. This medicine may increase your risk to bruise or bleed. Call your doctor or  health care professional if you notice any unusual bleeding. Be careful brushing and flossing your teeth or using a toothpick because you may get an infection or bleed more easily. If you have any dental work done, tell your dentist you are receiving this medicine. Avoid taking products that contain aspirin, acetaminophen, ibuprofen, naproxen, or ketoprofen unless instructed by your doctor. These medicines may hide a fever. Do not become pregnant while taking this medicine. Women should inform their doctor if they wish to become pregnant or think they might be pregnant. There is a potential for serious side effects to an unborn child. Talk to your health care professional or pharmacist for more information. Do not breast-feed an infant while taking this medicine. Drink fluids as directed while you are taking this medicine. This will help protect your kidneys. Call your doctor or health care professional if you get diarrhea. Do not treat yourself. What side effects may I notice from receiving this medicine? Side effects that you should report to your doctor or health care professional as soon as possible: -allergic reactions like skin rash, itching or hives, swelling of the face, lips, or tongue -signs of infection - fever or chills, cough, sore throat, pain or difficulty passing urine -signs of decreased platelets or bleeding - bruising, pinpoint red spots on the skin, black, tarry stools, nosebleeds -signs of decreased red blood cells - unusually weak or tired, fainting spells, lightheadedness -breathing problems -changes in hearing -gout pain -low blood counts - This drug may decrease the number of white blood cells, red blood cells and platelets. You may be at increased risk for infections and bleeding. -nausea and vomiting -pain, swelling, redness or irritation at the injection site -pain, tingling, numbness in the hands or feet -problems with balance, movement -trouble passing urine or  change in the amount of urine Side effects that usually do not require medical attention (report to your doctor or health care professional if they continue or are bothersome): -changes in vision -loss of appetite -metallic taste in the mouth or changes in taste This list may not describe all possible side effects. Call your doctor for medical advice about side effects. You may report side effects to FDA at 1-800-FDA-1088. Where should I keep my medicine? This drug is given in a hospital or clinic and will not be stored at home. NOTE: This sheet is a summary. It may not cover all possible information. If you have questions about this medicine, talk to your doctor, pharmacist, or health care provider.  2019 Elsevier/Gold Standard (2008-02-23 14:40:54)   Etoposide, VP-16 injection What is this medicine? ETOPOSIDE, VP-16 (e toe POE side) is a chemotherapy drug. It is used to treat testicular cancer, lung cancer, and other cancers. This medicine may be used for other purposes; ask your health care provider or pharmacist if you  have questions. COMMON BRAND NAME(S): Etopophos, Toposar, VePesid What should I tell my health care provider before I take this medicine? They need to know if you have any of these conditions: -infection -kidney disease -liver disease -low blood counts, like low white cell, platelet, or red cell counts -an unusual or allergic reaction to etoposide, other medicines, foods, dyes, or preservatives -pregnant or trying to get pregnant -breast-feeding How should I use this medicine? This medicine is for infusion into a vein. It is administered in a hospital or clinic by a specially trained health care professional. Talk to your pediatrician regarding the use of this medicine in children. Special care may be needed. Overdosage: If you think you have taken too much of this medicine contact a poison control center or emergency room at once. NOTE: This medicine is only for  you. Do not share this medicine with others. What if I miss a dose? It is important not to miss your dose. Call your doctor or health care professional if you are unable to keep an appointment. What may interact with this medicine? -aspirin -certain medications for seizures like carbamazepine, phenobarbital, phenytoin, valproic acid -cyclosporine -levamisole -warfarin This list may not describe all possible interactions. Give your health care provider a list of all the medicines, herbs, non-prescription drugs, or dietary supplements you use. Also tell them if you smoke, drink alcohol, or use illegal drugs. Some items may interact with your medicine. What should I watch for while using this medicine? Visit your doctor for checks on your progress. This drug may make you feel generally unwell. This is not uncommon, as chemotherapy can affect healthy cells as well as cancer cells. Report any side effects. Continue your course of treatment even though you feel ill unless your doctor tells you to stop. In some cases, you may be given additional medicines to help with side effects. Follow all directions for their use. Call your doctor or health care professional for advice if you get a fever, chills or sore throat, or other symptoms of a cold or flu. Do not treat yourself. This drug decreases your body's ability to fight infections. Try to avoid being around people who are sick. This medicine may increase your risk to bruise or bleed. Call your doctor or health care professional if you notice any unusual bleeding. Talk to your doctor about your risk of cancer. You may be more at risk for certain types of cancers if you take this medicine. Do not become pregnant while taking this medicine or for at least 6 months after stopping it. Women should inform their doctor if they wish to become pregnant or think they might be pregnant. Women of child-bearing potential will need to have a negative pregnancy test  before starting this medicine. There is a potential for serious side effects to an unborn child. Talk to your health care professional or pharmacist for more information. Do not breast-feed an infant while taking this medicine. Men must use a latex condom during sexual contact with a woman while taking this medicine and for at least 4 months after stopping it. A latex condom is needed even if you have had a vasectomy. Contact your doctor right away if your partner becomes pregnant. Do not donate sperm while taking this medicine and for at least 4 months after you stop taking this medicine. Men should inform their doctors if they wish to father a child. This medicine may lower sperm counts. What side effects may I notice from receiving  this medicine? Side effects that you should report to your doctor or health care professional as soon as possible: -allergic reactions like skin rash, itching or hives, swelling of the face, lips, or tongue -low blood counts - this medicine may decrease the number of white blood cells, red blood cells and platelets. You may be at increased risk for infections and bleeding. -signs of infection - fever or chills, cough, sore throat, pain or difficulty passing urine -signs of decreased platelets or bleeding - bruising, pinpoint red spots on the skin, black, tarry stools, blood in the urine -signs of decreased red blood cells - unusually weak or tired, fainting spells, lightheadedness -breathing problems -changes in vision -mouth or throat sores or ulcers -pain, redness, swelling or irritation at the injection site -pain, tingling, numbness in the hands or feet -redness, blistering, peeling or loosening of the skin, including inside the mouth -seizures -vomiting Side effects that usually do not require medical attention (report to your doctor or health care professional if they continue or are bothersome): -diarrhea -hair loss -loss of appetite -nausea -stomach  pain This list may not describe all possible side effects. Call your doctor for medical advice about side effects. You may report side effects to FDA at 1-800-FDA-1088. Where should I keep my medicine? This drug is given in a hospital or clinic and will not be stored at home. NOTE: This sheet is a summary. It may not cover all possible information. If you have questions about this medicine, talk to your doctor, pharmacist, or health care provider.  2019 Elsevier/Gold Standard (2015-11-10 11:53:23)

## 2019-04-14 ENCOUNTER — Other Ambulatory Visit: Payer: Self-pay | Admitting: Internal Medicine

## 2019-04-14 ENCOUNTER — Inpatient Hospital Stay: Payer: Medicare HMO

## 2019-04-14 ENCOUNTER — Telehealth: Payer: Self-pay | Admitting: *Deleted

## 2019-04-14 ENCOUNTER — Other Ambulatory Visit: Payer: Self-pay

## 2019-04-14 VITALS — BP 143/70 | HR 95 | Temp 97.8°F | Resp 20

## 2019-04-14 DIAGNOSIS — C3491 Malignant neoplasm of unspecified part of right bronchus or lung: Secondary | ICD-10-CM

## 2019-04-14 DIAGNOSIS — Z5189 Encounter for other specified aftercare: Secondary | ICD-10-CM | POA: Diagnosis not present

## 2019-04-14 DIAGNOSIS — C3411 Malignant neoplasm of upper lobe, right bronchus or lung: Secondary | ICD-10-CM | POA: Diagnosis not present

## 2019-04-14 DIAGNOSIS — Z5111 Encounter for antineoplastic chemotherapy: Secondary | ICD-10-CM | POA: Diagnosis not present

## 2019-04-14 MED ORDER — SODIUM CHLORIDE 0.9 % IV SOLN
Freq: Once | INTRAVENOUS | Status: AC
  Administered 2019-04-14: 09:00:00 via INTRAVENOUS
  Filled 2019-04-14: qty 250

## 2019-04-14 MED ORDER — SODIUM CHLORIDE 0.9 % IV SOLN
100.0000 mg/m2 | Freq: Once | INTRAVENOUS | Status: AC
  Administered 2019-04-14: 170 mg via INTRAVENOUS
  Filled 2019-04-14: qty 8.5

## 2019-04-14 MED ORDER — DEXAMETHASONE SODIUM PHOSPHATE 10 MG/ML IJ SOLN
INTRAMUSCULAR | Status: AC
  Filled 2019-04-14: qty 1

## 2019-04-14 MED ORDER — DEXAMETHASONE SODIUM PHOSPHATE 10 MG/ML IJ SOLN
10.0000 mg | Freq: Once | INTRAMUSCULAR | Status: AC
  Administered 2019-04-14: 09:00:00 10 mg via INTRAVENOUS

## 2019-04-14 MED ORDER — OXYCODONE HCL 5 MG PO TABS: 5.0000 mg | ORAL_TABLET | Freq: Three times a day (TID) | ORAL | 0 refills | Status: DC | PRN

## 2019-04-14 NOTE — Telephone Encounter (Signed)
Pt.notified

## 2019-04-14 NOTE — Patient Instructions (Signed)
Etoposide, VP-16 injection What is this medicine? ETOPOSIDE, VP-16 (e toe POE side) is a chemotherapy drug. It is used to treat testicular cancer, lung cancer, and other cancers. This medicine may be used for other purposes; ask your health care provider or pharmacist if you have questions. COMMON BRAND NAME(S): Etopophos, Toposar, VePesid What should I tell my health care provider before I take this medicine? They need to know if you have any of these conditions: -infection -kidney disease -liver disease -low blood counts, like low white cell, platelet, or red cell counts -an unusual or allergic reaction to etoposide, other medicines, foods, dyes, or preservatives -pregnant or trying to get pregnant -breast-feeding How should I use this medicine? This medicine is for infusion into a vein. It is administered in a hospital or clinic by a specially trained health care professional. Talk to your pediatrician regarding the use of this medicine in children. Special care may be needed. Overdosage: If you think you have taken too much of this medicine contact a poison control center or emergency room at once. NOTE: This medicine is only for you. Do not share this medicine with others. What if I miss a dose? It is important not to miss your dose. Call your doctor or health care professional if you are unable to keep an appointment. What may interact with this medicine? -aspirin -certain medications for seizures like carbamazepine, phenobarbital, phenytoin, valproic acid -cyclosporine -levamisole -warfarin This list may not describe all possible interactions. Give your health care provider a list of all the medicines, herbs, non-prescription drugs, or dietary supplements you use. Also tell them if you smoke, drink alcohol, or use illegal drugs. Some items may interact with your medicine. What should I watch for while using this medicine? Visit your doctor for checks on your progress. This drug  may make you feel generally unwell. This is not uncommon, as chemotherapy can affect healthy cells as well as cancer cells. Report any side effects. Continue your course of treatment even though you feel ill unless your doctor tells you to stop. In some cases, you may be given additional medicines to help with side effects. Follow all directions for their use. Call your doctor or health care professional for advice if you get a fever, chills or sore throat, or other symptoms of a cold or flu. Do not treat yourself. This drug decreases your body's ability to fight infections. Try to avoid being around people who are sick. This medicine may increase your risk to bruise or bleed. Call your doctor or health care professional if you notice any unusual bleeding. Talk to your doctor about your risk of cancer. You may be more at risk for certain types of cancers if you take this medicine. Do not become pregnant while taking this medicine or for at least 6 months after stopping it. Women should inform their doctor if they wish to become pregnant or think they might be pregnant. Women of child-bearing potential will need to have a negative pregnancy test before starting this medicine. There is a potential for serious side effects to an unborn child. Talk to your health care professional or pharmacist for more information. Do not breast-feed an infant while taking this medicine. Men must use a latex condom during sexual contact with a woman while taking this medicine and for at least 4 months after stopping it. A latex condom is needed even if you have had a vasectomy. Contact your doctor right away if your partner becomes pregnant. Do   not donate sperm while taking this medicine and for at least 4 months after you stop taking this medicine. Men should inform their doctors if they wish to father a child. This medicine may lower sperm counts. What side effects may I notice from receiving this medicine? Side effects that  you should report to your doctor or health care professional as soon as possible: -allergic reactions like skin rash, itching or hives, swelling of the face, lips, or tongue -low blood counts - this medicine may decrease the number of white blood cells, red blood cells and platelets. You may be at increased risk for infections and bleeding. -signs of infection - fever or chills, cough, sore throat, pain or difficulty passing urine -signs of decreased platelets or bleeding - bruising, pinpoint red spots on the skin, black, tarry stools, blood in the urine -signs of decreased red blood cells - unusually weak or tired, fainting spells, lightheadedness -breathing problems -changes in vision -mouth or throat sores or ulcers -pain, redness, swelling or irritation at the injection site -pain, tingling, numbness in the hands or feet -redness, blistering, peeling or loosening of the skin, including inside the mouth -seizures -vomiting Side effects that usually do not require medical attention (report to your doctor or health care professional if they continue or are bothersome): -diarrhea -hair loss -loss of appetite -nausea -stomach pain This list may not describe all possible side effects. Call your doctor for medical advice about side effects. You may report side effects to FDA at 1-800-FDA-1088. Where should I keep my medicine? This drug is given in a hospital or clinic and will not be stored at home. NOTE: This sheet is a summary. It may not cover all possible information. If you have questions about this medicine, talk to your doctor, pharmacist, or health care provider.  2019 Elsevier/Gold Standard (2015-11-10 11:53:23)  

## 2019-04-14 NOTE — Telephone Encounter (Signed)
Pt is requesting a refill of her oxycodone. It was originally filled by Dr [name]. He explained that she is now under [facility] care and will need to get refills from him. She states she is having pain in her head and chest.

## 2019-04-14 NOTE — Telephone Encounter (Signed)
I will refill it one time for now. Surgical pain should be given narcotics for long time.

## 2019-04-15 ENCOUNTER — Other Ambulatory Visit: Payer: Self-pay

## 2019-04-15 ENCOUNTER — Inpatient Hospital Stay: Payer: Medicare HMO

## 2019-04-15 ENCOUNTER — Other Ambulatory Visit: Payer: Self-pay | Admitting: Family Medicine

## 2019-04-15 VITALS — BP 129/60 | HR 86 | Temp 98.0°F | Resp 20

## 2019-04-15 DIAGNOSIS — F419 Anxiety disorder, unspecified: Secondary | ICD-10-CM

## 2019-04-15 DIAGNOSIS — Z5189 Encounter for other specified aftercare: Secondary | ICD-10-CM | POA: Diagnosis not present

## 2019-04-15 DIAGNOSIS — C3491 Malignant neoplasm of unspecified part of right bronchus or lung: Secondary | ICD-10-CM

## 2019-04-15 DIAGNOSIS — C3411 Malignant neoplasm of upper lobe, right bronchus or lung: Secondary | ICD-10-CM | POA: Diagnosis not present

## 2019-04-15 DIAGNOSIS — Z5111 Encounter for antineoplastic chemotherapy: Secondary | ICD-10-CM | POA: Diagnosis not present

## 2019-04-15 MED ORDER — SODIUM CHLORIDE 0.9 % IV SOLN
100.0000 mg/m2 | Freq: Once | INTRAVENOUS | Status: AC
  Administered 2019-04-15: 170 mg via INTRAVENOUS
  Filled 2019-04-15: qty 8.5

## 2019-04-15 MED ORDER — SODIUM CHLORIDE 0.9 % IV SOLN
Freq: Once | INTRAVENOUS | Status: AC
  Administered 2019-04-15: 10:00:00 via INTRAVENOUS
  Filled 2019-04-15: qty 250

## 2019-04-15 MED ORDER — DEXAMETHASONE SODIUM PHOSPHATE 10 MG/ML IJ SOLN
10.0000 mg | Freq: Once | INTRAMUSCULAR | Status: AC
  Administered 2019-04-15: 10 mg via INTRAVENOUS

## 2019-04-15 MED ORDER — DEXAMETHASONE SODIUM PHOSPHATE 10 MG/ML IJ SOLN
INTRAMUSCULAR | Status: AC
  Filled 2019-04-15: qty 1

## 2019-04-15 NOTE — Patient Instructions (Addendum)
Coronavirus (COVID-19) Are you at risk?  Are you at risk for the Coronavirus (COVID-19)?  To be considered HIGH RISK for Coronavirus (COVID-19), you have to meet the following criteria:  . Traveled to [place], [place], [place], [place] or [place]; or in the [place] to [place], [place], [place], or [place]; and have fever, cough, and shortness of breath within the last 2 weeks of travel OR . Been in close contact with a person diagnosed with COVID-19 within the last 2 weeks and have fever, cough, and shortness of breath . IF YOU DO NOT MEET THESE CRITERIA, YOU ARE CONSIDERED LOW RISK FOR COVID-19.  What to do if you are HIGH RISK for COVID-19?  . If you are having a medical emergency, call 911. . Seek medical care right away. Before you go to a doctor's office, urgent care or emergency department, call ahead and tell them about your recent travel, contact with someone diagnosed with COVID-19, and your symptoms. You should receive instructions from your physician's office regarding next steps of care.  . When you arrive at healthcare provider, tell the healthcare staff immediately you have returned from visiting [place], [place], [place], [place] or [place]; or traveled in the [place] to [place], [place], [place], or [place]; in the last two weeks or you have been in close contact with a person diagnosed with COVID-19 in the last 2 weeks.   . Tell the health care staff about your symptoms: fever, cough and shortness of breath. . After you have been seen by a medical provider, you will be either: o Tested for (COVID-19) and discharged home on quarantine except to seek medical care if symptoms worsen, and asked to  - Stay home and avoid contact with others until you get your results (4-5 days)  - Avoid travel on public transportation if possible (such as bus, train, or airplane) or o Sent to the Emergency Department by EMS for evaluation, COVID-19 testing, and possible  admission depending on your condition and test results.  What to do if you are LOW RISK for COVID-19?  Reduce your risk of any infection by using the same precautions used for avoiding the common cold or flu:  . Wash your hands often with soap and warm water for at least 20 seconds.  If soap and water are not readily available, use an alcohol-based hand sanitizer with at least 60% alcohol.  . If coughing or sneezing, cover your mouth and nose by coughing or sneezing into the elbow areas of your shirt or coat, into a tissue or into your sleeve (not your hands). . Avoid shaking hands with others and consider head nods or verbal greetings only. . Avoid touching your eyes, nose, or mouth with unwashed hands.  . Avoid close contact with people who are sick. . Avoid places or events with large numbers of people in one location, like concerts or sporting events. . Carefully consider travel plans you have or are making. . If you are planning any travel outside or inside the [place], visit the CDC's Travelers' Health webpage for the latest health notices. . If you have some symptoms but not all symptoms, continue to monitor at home and seek medical attention if your symptoms worsen. . If you are having a medical emergency, call 911.   ADDITIONAL HEALTHCARE OPTIONS FOR PATIENTS  [facility] / e-Visit: [email or web address]         [facility]: [phone]  [place]   [facility]: [facility]: [place] Discharge Instructions for Patients Receiving Chemotherapy  Today you received the following chemotherapy agents Etoposide  To help prevent nausea and vomiting after your treatment, we encourage you to take your nausea medication as directed.    If you develop nausea and vomiting that is not controlled by your nausea medication, call the clinic.   BELOW ARE  SYMPTOMS THAT SHOULD BE REPORTED IMMEDIATELY:  *FEVER GREATER THAN 100.5 F  *CHILLS WITH OR WITHOUT FEVER  NAUSEA AND VOMITING THAT IS NOT CONTROLLED WITH YOUR NAUSEA MEDICATION  *UNUSUAL SHORTNESS OF BREATH  *UNUSUAL BRUISING OR BLEEDING  TENDERNESS IN MOUTH AND THROAT WITH OR WITHOUT PRESENCE OF ULCERS  *URINARY PROBLEMS  *BOWEL PROBLEMS  UNUSUAL RASH Items with * indicate a potential emergency and should be followed up as soon as possible.  Feel free to call the clinic should you have any questions or concerns. The clinic phone number is [phone].  Please show the [place] at check-in to the Emergency Department and triage nurse.  Filgrastim, G-CSF injection Ellen Henri) What is this medicine? FILGRASTIM, G-CSF (fil GRA stim) is a granulocyte colony-stimulating factor that stimulates the growth of neutrophils, a type of white blood cell (WBC) important in the body's fight against infection. It is used to reduce the incidence of fever and infection in patients with certain types of cancer who are receiving chemotherapy that affects the bone marrow, to stimulate blood cell production for removal of WBCs from the body prior to a bone marrow transplantation, to reduce the incidence of fever and infection in patients who have severe chronic neutropenia, and to improve survival outcomes following high-dose radiation exposure that is toxic to the bone marrow. This medicine may be used for other purposes; ask your health care provider or pharmacist if you have questions. COMMON BRAND NAME(S): Neupogen, Nivestym, Zarxio What should I tell my health care provider before I take this medicine? They need to know if you have any of these conditions: -kidney disease -latex allergy -ongoing radiation therapy -sickle cell disease -an unusual or allergic reaction to filgrastim, pegfilgrastim, other medicines, foods, dyes, or preservatives -pregnant or trying to get  pregnant -breast-feeding How should I use this medicine? This medicine is for injection under the skin or infusion into a vein. As an infusion into a vein, it is usually given by a health care professional in a hospital or clinic setting. If you get this medicine at home, you will be taught how to prepare and give this medicine. Refer to the Instructions for Use that come with your medication packaging. Use exactly as directed. Take your medicine at regular intervals. Do not take your medicine more often than directed. It is important that you put your used needles and syringes in a special sharps container. Do not put them in a trash can. If you do not have a sharps container, call your pharmacist or healthcare provider to get one. Talk to your pediatrician regarding the use of this medicine in children. While this drug may be prescribed for children as young as 7 months for selected conditions, precautions do apply. Overdosage: If you think you have taken too much of this medicine contact a poison control center or emergency room at once. NOTE: This medicine is only for you. Do not share this medicine with others. What  if I miss a dose? It is important not to miss your dose. Call your doctor or health care professional if you miss a dose. What may interact with this medicine? This medicine may interact with the following medications: -medicines that may cause a release of neutrophils, such as lithium This list may not describe all possible interactions. Give your health care provider a list of all the medicines, herbs, non-prescription drugs, or dietary supplements you use. Also tell them if you smoke, drink alcohol, or use illegal drugs. Some items may interact with your medicine. What should I watch for while using this medicine? You may need blood work done while you are taking this medicine. What side effects may I notice from receiving this medicine? Side effects that you should report to  your doctor or health care professional as soon as possible: -allergic reactions like skin rash, itching or hives, swelling of the face, lips, or tongue -back pain -dizziness or feeling faint -fever -pain, redness, or irritation at site where injected -pinpoint red spots on the skin -shortness of breath or breathing problems -signs and symptoms of kidney injury like trouble passing urine, change in the amount of urine, or red or dark-brown urine -stomach or side pain, or pain at the shoulder -swelling -tiredness -unusual bleeding or bruising Side effects that usually do not require medical attention (report to your doctor or health care professional if they continue or are bothersome): -bone pain -cough -diarrhea -hair loss -headache -muscle pain This list may not describe all possible side effects. Call your doctor for medical advice about side effects. You may report side effects to FDA at 1-800-FDA-1088. Where should I keep my medicine? Keep out of the reach of children. Store in a refrigerator between 2 and 8 degrees C (36 and 46 degrees F). Do not freeze. Keep in carton to protect from light. Throw away this medicine if vials or syringes are left out of the refrigerator for more than 24 hours. Throw away any unused medicine after the expiration date. NOTE: This sheet is a summary. It may not cover all possible information. If you have questions about this medicine, talk to your doctor, pharmacist, or health care provider.  2019 Elsevier/Gold Standard (2018-07-03 15:39:45)

## 2019-04-17 ENCOUNTER — Other Ambulatory Visit: Payer: Self-pay

## 2019-04-17 ENCOUNTER — Inpatient Hospital Stay: Payer: Medicare HMO

## 2019-04-17 VITALS — BP 95/55 | HR 62 | Temp 98.9°F | Resp 16

## 2019-04-17 DIAGNOSIS — Z5189 Encounter for other specified aftercare: Secondary | ICD-10-CM | POA: Diagnosis not present

## 2019-04-17 DIAGNOSIS — Z5111 Encounter for antineoplastic chemotherapy: Secondary | ICD-10-CM | POA: Diagnosis not present

## 2019-04-17 DIAGNOSIS — C3491 Malignant neoplasm of unspecified part of right bronchus or lung: Secondary | ICD-10-CM

## 2019-04-17 DIAGNOSIS — C3411 Malignant neoplasm of upper lobe, right bronchus or lung: Secondary | ICD-10-CM | POA: Diagnosis not present

## 2019-04-17 MED ORDER — PEGFILGRASTIM-CBQV 6 MG/0.6ML ~~LOC~~ SOSY
PREFILLED_SYRINGE | SUBCUTANEOUS | Status: AC
  Filled 2019-04-17: qty 0.6

## 2019-04-17 MED ORDER — PEGFILGRASTIM-CBQV 6 MG/0.6ML ~~LOC~~ SOSY
6.0000 mg | PREFILLED_SYRINGE | Freq: Once | SUBCUTANEOUS | Status: AC
  Administered 2019-04-17: 12:00:00 6 mg via SUBCUTANEOUS

## 2019-04-17 NOTE — Patient Instructions (Signed)
Pegfilgrastim injection  What is this medicine?  PEGFILGRASTIM (PEG fil gra stim) is a long-acting granulocyte colony-stimulating factor that stimulates the growth of neutrophils, a type of white blood cell important in the body's fight against infection. It is used to reduce the incidence of fever and infection in patients with certain types of cancer who are receiving chemotherapy that affects the bone marrow, and to increase survival after being exposed to high doses of radiation.  This medicine may be used for other purposes; ask your health care provider or pharmacist if you have questions.  COMMON BRAND NAME(S): Fulphila, Neulasta, UDENYCA  What should I tell my health care provider before I take this medicine?  They need to know if you have any of these conditions:  -kidney disease  -latex allergy  -ongoing radiation therapy  -sickle cell disease  -skin reactions to acrylic adhesives (On-Body Injector only)  -an unusual or allergic reaction to pegfilgrastim, filgrastim, other medicines, foods, dyes, or preservatives  -pregnant or trying to get pregnant  -breast-feeding  How should I use this medicine?  This medicine is for injection under the skin. If you get this medicine at home, you will be taught how to prepare and give the pre-filled syringe or how to use the On-body Injector. Refer to the patient Instructions for Use for detailed instructions. Use exactly as directed. Tell your healthcare provider immediately if you suspect that the On-body Injector may not have performed as intended or if you suspect the use of the On-body Injector resulted in a missed or partial dose.  It is important that you put your used needles and syringes in a special sharps container. Do not put them in a trash can. If you do not have a sharps container, call your pharmacist or healthcare provider to get one.  Talk to your pediatrician regarding the use of this medicine in children. While this drug may be prescribed for  selected conditions, precautions do apply.  Overdosage: If you think you have taken too much of this medicine contact a poison control center or emergency room at once.  NOTE: This medicine is only for you. Do not share this medicine with others.  What if I miss a dose?  It is important not to miss your dose. Call your doctor or health care professional if you miss your dose. If you miss a dose due to an On-body Injector failure or leakage, a new dose should be administered as soon as possible using a single prefilled syringe for manual use.  What may interact with this medicine?  Interactions have not been studied.  Give your health care provider a list of all the medicines, herbs, non-prescription drugs, or dietary supplements you use. Also tell them if you smoke, drink alcohol, or use illegal drugs. Some items may interact with your medicine.  This list may not describe all possible interactions. Give your health care provider a list of all the medicines, herbs, non-prescription drugs, or dietary supplements you use. Also tell them if you smoke, drink alcohol, or use illegal drugs. Some items may interact with your medicine.  What should I watch for while using this medicine?  You may need blood work done while you are taking this medicine.  If you are going to need a MRI, CT scan, or other procedure, tell your doctor that you are using this medicine (On-Body Injector only).  What side effects may I notice from receiving this medicine?  Side effects that you should report to   your doctor or health care professional as soon as possible:  -allergic reactions like skin rash, itching or hives, swelling of the face, lips, or tongue  -back pain  -dizziness  -fever  -pain, redness, or irritation at site where injected  -pinpoint red spots on the skin  -red or dark-brown urine  -shortness of breath or breathing problems  -stomach or side pain, or pain at the shoulder  -swelling  -tiredness  -trouble passing urine or  change in the amount of urine  Side effects that usually do not require medical attention (report to your doctor or health care professional if they continue or are bothersome):  -bone pain  -muscle pain  This list may not describe all possible side effects. Call your doctor for medical advice about side effects. You may report side effects to FDA at 1-800-FDA-1088.  Where should I keep my medicine?  Keep out of the reach of children.  If you are using this medicine at home, you will be instructed on how to store it. Throw away any unused medicine after the expiration date on the label.  NOTE: This sheet is a summary. It may not cover all possible information. If you have questions about this medicine, talk to your doctor, pharmacist, or health care provider.   2019 Elsevier/Gold Standard (2018-02-23 16:57:08)

## 2019-04-18 ENCOUNTER — Encounter (HOSPITAL_COMMUNITY): Payer: Self-pay

## 2019-04-18 ENCOUNTER — Other Ambulatory Visit: Payer: Self-pay

## 2019-04-18 ENCOUNTER — Emergency Department (HOSPITAL_COMMUNITY): Payer: Medicare HMO

## 2019-04-18 ENCOUNTER — Emergency Department (HOSPITAL_COMMUNITY)
Admission: EM | Admit: 2019-04-18 | Discharge: 2019-04-18 | Disposition: A | Discharge status: Home / Self Care | Payer: Medicare HMO | Attending: Emergency Medicine | Admitting: Emergency Medicine

## 2019-04-18 DIAGNOSIS — Y92039 Unspecified place in apartment as the place of occurrence of the external cause: Secondary | ICD-10-CM | POA: Insufficient documentation

## 2019-04-18 DIAGNOSIS — Y939 Activity, unspecified: Secondary | ICD-10-CM | POA: Insufficient documentation

## 2019-04-18 DIAGNOSIS — Z79899 Other long term (current) drug therapy: Secondary | ICD-10-CM | POA: Diagnosis not present

## 2019-04-18 DIAGNOSIS — R42 Dizziness and giddiness: Secondary | ICD-10-CM | POA: Diagnosis not present

## 2019-04-18 DIAGNOSIS — I2510 Atherosclerotic heart disease of native coronary artery without angina pectoris: Secondary | ICD-10-CM | POA: Diagnosis not present

## 2019-04-18 DIAGNOSIS — J449 Chronic obstructive pulmonary disease, unspecified: Secondary | ICD-10-CM | POA: Insufficient documentation

## 2019-04-18 DIAGNOSIS — S0990XA Unspecified injury of head, initial encounter: Secondary | ICD-10-CM | POA: Diagnosis not present

## 2019-04-18 DIAGNOSIS — F17210 Nicotine dependence, cigarettes, uncomplicated: Secondary | ICD-10-CM | POA: Insufficient documentation

## 2019-04-18 DIAGNOSIS — R630 Anorexia: Secondary | ICD-10-CM | POA: Insufficient documentation

## 2019-04-18 DIAGNOSIS — I959 Hypotension, unspecified: Secondary | ICD-10-CM | POA: Diagnosis not present

## 2019-04-18 DIAGNOSIS — S299XXA Unspecified injury of thorax, initial encounter: Secondary | ICD-10-CM | POA: Diagnosis not present

## 2019-04-18 DIAGNOSIS — Z85828 Personal history of other malignant neoplasm of skin: Secondary | ICD-10-CM | POA: Diagnosis not present

## 2019-04-18 DIAGNOSIS — E860 Dehydration: Secondary | ICD-10-CM

## 2019-04-18 DIAGNOSIS — R55 Syncope and collapse: Secondary | ICD-10-CM | POA: Diagnosis not present

## 2019-04-18 DIAGNOSIS — Z85118 Personal history of other malignant neoplasm of bronchus and lung: Secondary | ICD-10-CM | POA: Insufficient documentation

## 2019-04-18 DIAGNOSIS — D72829 Elevated white blood cell count, unspecified: Secondary | ICD-10-CM | POA: Diagnosis not present

## 2019-04-18 DIAGNOSIS — I499 Cardiac arrhythmia, unspecified: Secondary | ICD-10-CM | POA: Diagnosis not present

## 2019-04-18 DIAGNOSIS — W19XXXA Unspecified fall, initial encounter: Secondary | ICD-10-CM

## 2019-04-18 DIAGNOSIS — W1830XA Fall on same level, unspecified, initial encounter: Secondary | ICD-10-CM | POA: Diagnosis not present

## 2019-04-18 DIAGNOSIS — Y999 Unspecified external cause status: Secondary | ICD-10-CM | POA: Insufficient documentation

## 2019-04-18 DIAGNOSIS — S199XXA Unspecified injury of neck, initial encounter: Secondary | ICD-10-CM | POA: Diagnosis not present

## 2019-04-18 DIAGNOSIS — I4510 Unspecified right bundle-branch block: Secondary | ICD-10-CM | POA: Diagnosis not present

## 2019-04-18 HISTORY — DX: Malignant neoplasm of unspecified part of unspecified bronchus or lung: C34.90

## 2019-04-18 LAB — CBC WITH DIFFERENTIAL/PLATELET
Abs Immature Granulocytes: 3.16 10*3/uL — ABNORMAL HIGH (ref 0.00–0.07)
Basophils Absolute: 0.1 10*3/uL (ref 0.0–0.1)
Basophils Relative: 0 %
Eosinophils Absolute: 0 10*3/uL (ref 0.0–0.5)
Eosinophils Relative: 0 %
HCT: 39.4 % (ref 36.0–46.0)
Hemoglobin: 12.5 g/dL (ref 12.0–15.0)
Immature Granulocytes: 10 %
Lymphocytes Relative: 5 %
Lymphs Abs: 1.7 10*3/uL (ref 0.7–4.0)
MCH: 28.3 pg (ref 26.0–34.0)
MCHC: 31.7 g/dL (ref 30.0–36.0)
MCV: 89.1 fL (ref 80.0–100.0)
Monocytes Absolute: 0 10*3/uL — ABNORMAL LOW (ref 0.1–1.0)
Monocytes Relative: 0 %
Neutro Abs: 28.2 10*3/uL — ABNORMAL HIGH (ref 1.7–7.7)
Neutrophils Relative %: 85 %
Platelets: 212 10*3/uL (ref 150–400)
RBC: 4.42 MIL/uL (ref 3.87–5.11)
RDW: 14.1 % (ref 11.5–15.5)
WBC: 33.2 10*3/uL — ABNORMAL HIGH (ref 4.0–10.5)
nRBC: 0 % (ref 0.0–0.2)

## 2019-04-18 LAB — POCT I-STAT EG7
Acid-Base Excess: 9 mmol/L — ABNORMAL HIGH (ref 0.0–2.0)
Bicarbonate: 34.8 mmol/L — ABNORMAL HIGH (ref 20.0–28.0)
Calcium, Ion: 1.01 mmol/L — ABNORMAL LOW (ref 1.15–1.40)
HCT: 38 % (ref 36.0–46.0)
Hemoglobin: 12.9 g/dL (ref 12.0–15.0)
O2 Saturation: 93 %
Potassium: 4.2 mmol/L (ref 3.5–5.1)
Sodium: 135 mmol/L (ref 135–145)
TCO2: 36 mmol/L — ABNORMAL HIGH (ref 22–32)
pCO2, Ven: 53.8 mmHg (ref 44.0–60.0)
pH, Ven: 7.42 (ref 7.250–7.430)
pO2, Ven: 69 mmHg — ABNORMAL HIGH (ref 32.0–45.0)

## 2019-04-18 LAB — MAGNESIUM: Magnesium: 1.8 mg/dL (ref 1.7–2.4)

## 2019-04-18 LAB — URINALYSIS, ROUTINE W REFLEX MICROSCOPIC
Bilirubin Urine: NEGATIVE
Glucose, UA: NEGATIVE mg/dL
Hgb urine dipstick: NEGATIVE
Ketones, ur: NEGATIVE mg/dL
Leukocytes,Ua: NEGATIVE
Nitrite: NEGATIVE
Protein, ur: NEGATIVE mg/dL
Specific Gravity, Urine: 1.005 (ref 1.005–1.030)
pH: 6 (ref 5.0–8.0)

## 2019-04-18 LAB — COMPREHENSIVE METABOLIC PANEL
ALT: 71 U/L — ABNORMAL HIGH (ref 0–44)
AST: 32 U/L (ref 15–41)
Albumin: 3.1 g/dL — ABNORMAL LOW (ref 3.5–5.0)
Alkaline Phosphatase: 147 U/L — ABNORMAL HIGH (ref 38–126)
Anion gap: 12 (ref 5–15)
BUN: 17 mg/dL (ref 8–23)
CO2: 30 mmol/L (ref 22–32)
Calcium: 8.8 mg/dL — ABNORMAL LOW (ref 8.9–10.3)
Chloride: 96 mmol/L — ABNORMAL LOW (ref 98–111)
Creatinine, Ser: 0.86 mg/dL (ref 0.44–1.00)
GFR calc Af Amer: >60 mL/min (ref >60)
GFR calc non Af Amer: >60 mL/min (ref >60)
Glucose, Bld: 139 mg/dL — ABNORMAL HIGH (ref 70–99)
Potassium: 3.8 mmol/L (ref 3.5–5.1)
Sodium: 138 mmol/L (ref 135–145)
Total Bilirubin: 0.8 mg/dL (ref 0.3–1.2)
Total Protein: 6 g/dL — ABNORMAL LOW (ref 6.5–8.1)

## 2019-04-18 LAB — PHOSPHORUS: Phosphorus: 5.2 mg/dL — ABNORMAL HIGH (ref 2.5–4.6)

## 2019-04-18 LAB — URIC ACID: Uric Acid, Serum: 5.7 mg/dL (ref 2.5–7.1)

## 2019-04-18 LAB — LACTIC ACID, PLASMA: Lactic Acid, Venous: 1.1 mmol/L (ref 0.5–1.9)

## 2019-04-18 MED ORDER — METRONIDAZOLE IN NACL 5-0.79 MG/ML-% IV SOLN: 500.0000 mg | Freq: Once | INTRAVENOUS | Status: DC

## 2019-04-18 MED ORDER — LACTATED RINGERS IV BOLUS (SEPSIS)
1000.0000 mL | Freq: Once | INTRAVENOUS | Status: AC
  Administered 2019-04-18: 17:00:00 1000 mL via INTRAVENOUS

## 2019-04-18 MED ORDER — SODIUM CHLORIDE 0.9 % IV SOLN: 2.0000 g | Freq: Once | INTRAVENOUS | Status: DC

## 2019-04-18 MED ORDER — LACTATED RINGERS IV BOLUS (SEPSIS)
1000.0000 mL | Freq: Once | INTRAVENOUS | Status: AC
  Administered 2019-04-18: 16:00:00 1000 mL via INTRAVENOUS

## 2019-04-18 MED ORDER — VANCOMYCIN HCL IN DEXTROSE 1-5 GM/200ML-% IV SOLN: 1000.0000 mg | Freq: Once | INTRAVENOUS | Status: DC

## 2019-04-18 NOTE — ED Triage Notes (Signed)
Pt brought in by [facility] from home for x3 near syncopal episodes that all resulted in falls. Pt denies LOC, denies injuries. Pt recently started on chemo on Tuesday, endorses decreased PO intake since. Pt is on a diuretic, amiodarone, metoprolol, and cardizem. Pt states her BP runs low at baseline. Pt A+Ox4, skin warm and dry. Pt given 4mg  zofran PTA with some relief of nausea.

## 2019-04-18 NOTE — ED Notes (Signed)
Patient verbalizes understanding of discharge instructions. Opportunity for questioning and answers were provided. Armband removed by staff, pt discharged from ED.  

## 2019-04-18 NOTE — ED Notes (Signed)
Patient transported to X-ray 

## 2019-04-18 NOTE — Discharge Instructions (Addendum)
We saw you in the ER after you had a fall. All the imaging results are normal, no fractures seen. No evidence of brain bleed.  Were not quite sure why you had a fall.  It is possible that your fall was because of weakness on the dehydration. Please be very careful with walking, and do everything possible to prevent falls.  Please return to the ER if your symptoms worsen; you have increased pain, fevers, chills, inability to keep any medications down, confusion. Otherwise see the outpatient doctor as requested.

## 2019-04-19 ENCOUNTER — Other Ambulatory Visit: Payer: Self-pay

## 2019-04-19 ENCOUNTER — Encounter: Payer: Self-pay | Admitting: Medical Oncology

## 2019-04-19 ENCOUNTER — Other Ambulatory Visit: Payer: Self-pay | Admitting: Internal Medicine

## 2019-04-19 ENCOUNTER — Encounter: Payer: Self-pay | Admitting: Internal Medicine

## 2019-04-19 ENCOUNTER — Inpatient Hospital Stay: Payer: Medicare HMO

## 2019-04-19 ENCOUNTER — Inpatient Hospital Stay (HOSPITAL_BASED_OUTPATIENT_CLINIC_OR_DEPARTMENT_OTHER): Payer: Medicare HMO | Admitting: Internal Medicine

## 2019-04-19 VITALS — BP 99/60 | HR 69 | Temp 98.0°F | Resp 17 | Ht 62.0 in | Wt 137.4 lb

## 2019-04-19 DIAGNOSIS — L658 Other specified nonscarring hair loss: Secondary | ICD-10-CM | POA: Diagnosis not present

## 2019-04-19 DIAGNOSIS — C3491 Malignant neoplasm of unspecified part of right bronchus or lung: Secondary | ICD-10-CM

## 2019-04-19 DIAGNOSIS — C50919 Malignant neoplasm of unspecified site of unspecified female breast: Secondary | ICD-10-CM | POA: Diagnosis not present

## 2019-04-19 DIAGNOSIS — C3411 Malignant neoplasm of upper lobe, right bronchus or lung: Secondary | ICD-10-CM | POA: Diagnosis not present

## 2019-04-19 DIAGNOSIS — E860 Dehydration: Secondary | ICD-10-CM

## 2019-04-19 DIAGNOSIS — Z5111 Encounter for antineoplastic chemotherapy: Secondary | ICD-10-CM | POA: Diagnosis not present

## 2019-04-19 DIAGNOSIS — Z5189 Encounter for other specified aftercare: Secondary | ICD-10-CM | POA: Diagnosis not present

## 2019-04-19 DIAGNOSIS — R110 Nausea: Secondary | ICD-10-CM | POA: Diagnosis not present

## 2019-04-19 DIAGNOSIS — C3492 Malignant neoplasm of unspecified part of left bronchus or lung: Secondary | ICD-10-CM | POA: Diagnosis not present

## 2019-04-19 LAB — CBC WITH DIFFERENTIAL (CANCER CENTER ONLY)
Abs Immature Granulocytes: 0 10*3/uL (ref 0.00–0.07)
Basophils Absolute: 0 10*3/uL (ref 0.0–0.1)
Basophils Relative: 0 %
Eosinophils Absolute: 0 10*3/uL (ref 0.0–0.5)
Eosinophils Relative: 0 %
HCT: 40.1 % (ref 36.0–46.0)
Hemoglobin: 12.6 g/dL (ref 12.0–15.0)
Lymphocytes Relative: 14 %
Lymphs Abs: 3.9 10*3/uL (ref 0.7–4.0)
MCH: 28.2 pg (ref 26.0–34.0)
MCHC: 31.4 g/dL (ref 30.0–36.0)
MCV: 89.7 fL (ref 80.0–100.0)
Monocytes Absolute: 0 10*3/uL — ABNORMAL LOW (ref 0.1–1.0)
Monocytes Relative: 0 %
Neutro Abs: 24.3 10*3/uL — ABNORMAL HIGH (ref 1.7–17.7)
Neutrophils Relative %: 86 %
Platelet Count: 168 10*3/uL (ref 150–400)
RBC: 4.47 MIL/uL (ref 3.87–5.11)
RDW: 14.3 % (ref 11.5–15.5)
WBC Count: 28.2 10*3/uL — ABNORMAL HIGH (ref 4.0–10.5)
nRBC: 0 % (ref 0.0–0.2)

## 2019-04-19 LAB — URINE CULTURE

## 2019-04-19 LAB — CMP (CANCER CENTER ONLY)
ALT: 63 U/L — ABNORMAL HIGH (ref 0–44)
AST: 19 U/L (ref 15–41)
Albumin: 3.2 g/dL — ABNORMAL LOW (ref 3.5–5.0)
Alkaline Phosphatase: 178 U/L — ABNORMAL HIGH (ref 38–126)
Anion gap: 8 (ref 5–15)
BUN: 12 mg/dL (ref 8–23)
CO2: 37 mmol/L — ABNORMAL HIGH (ref 22–32)
Calcium: 9.1 mg/dL (ref 8.9–10.3)
Chloride: 96 mmol/L — ABNORMAL LOW (ref 98–111)
Creatinine: 0.78 mg/dL (ref 0.44–1.00)
GFR, Est AFR Am: >60 mL/min (ref >60)
GFR, Estimated: >60 mL/min (ref >60)
Glucose, Bld: 124 mg/dL — ABNORMAL HIGH (ref 70–99)
Potassium: 3.4 mmol/L — ABNORMAL LOW (ref 3.5–5.1)
Sodium: 141 mmol/L (ref 135–145)
Total Bilirubin: 0.6 mg/dL (ref 0.3–1.2)
Total Protein: 6.4 g/dL — ABNORMAL LOW (ref 6.5–8.1)

## 2019-04-19 LAB — MAGNESIUM: Magnesium: 1.5 mg/dL — ABNORMAL LOW (ref 1.7–2.4)

## 2019-04-19 MED ORDER — DEXAMETHASONE 4 MG PO TABS: ORAL_TABLET | ORAL | 0 refills | Status: DC

## 2019-04-19 MED ORDER — MAGNESIUM OXIDE 400 (241.3 MG) MG PO TABS: ORAL_TABLET | ORAL | 1 refills | Status: DC

## 2019-04-19 NOTE — Progress Notes (Signed)
[place] [phone]   [phone]  OFFICE PROGRESS NOTE  Martin[place], Tonya G, MD [place] [address]  DIAGNOSIS: limited stage, stage Ia (T1b, N0, M0) small cell lung cancer   PRIOR THERAPY: status post right upper and middle bilobectomy with lymph node dissection under the care of Dr. [name] January 31, 2019.  CURRENT THERAPY: chemotherapy with cisplatin 80 mg/M2 on day 1 and etoposide 100 mg/M2 on days 1, 2 and 3 every 3 weeks.  Status post 1 cycle.  INTERVAL HISTORY: Tonya Bradley 68 y.o. female returns to the clinic today for follow-up visit.  The patient is feeling fine today with no concerning complaints except for persistent nausea after her chemotherapy.  She took Compazine with no improvement in her condition.  She denied having any fever or chills.  She has no vomiting, diarrhea or constipation.  She denied having any chest pain, shortness of breath, cough or hemoptysis.  She has no headache or visual changes.  She is here for evaluation and repeat blood work.  MEDICAL HISTORY: Past Medical History:  Diagnosis Date   Anemia    Anxiety    Arthritis    "back"   Atrial flutter ([facility]) 03/13/2016   CAD (coronary artery disease)    Cancer (HCC)    skin cancer   Cataract    Colon polyp    Complication of anesthesia    BP dropped during esophagus diltation in 2017-admitted for 3 days.    COPD (chronic obstructive pulmonary disease) (HCC)    Depression    Dysrhythmia    Emphysema of lung (HCC)    GERD (gastroesophageal reflux disease)    Hypercholesterolemia    Lung cancer (HCC)    Osteoporosis    Oxygen deficiency     ALLERGIES:  is allergic to morphine and related.  MEDICATIONS:  Current Outpatient Medications  Medication Sig Dispense Refill   ALPRAZolam (XANAX) 0.5 MG tablet Take 1 tablet (0.5 mg total) by mouth 2 (two) times daily as needed for anxiety. (Patient taking  differently: Take 0.5 mg by mouth 2 (two) times daily. ) 60 tablet 1   amiodarone (PACERONE) 200 MG tablet TAKE 1/2 TABLET BY MOUTH ONCE DAILY (Patient not taking: Reported on 04/18/2019) 90 tablet 3   atorvastatin (LIPITOR) 80 MG tablet TAKE 1 TABLET(80 MG) BY MOUTH DAILY (Patient taking differently: Take 80 mg by mouth daily. ) 90 tablet 1   Cholecalciferol (VITAMIN D) 2000 units CAPS Take 2,000 Units by mouth daily.     cycloSPORINE (RESTASIS) 0.05 % ophthalmic emulsion Place 1 drop into both eyes 2 (two) times daily.     Dexlansoprazole (DEXILANT) 30 MG capsule Take 1 capsule (30 mg total) by mouth daily. 90 capsule 3   diltiazem (CARDIZEM CD) 120 MG 24 hr capsule TAKE 1 CAPSULE(120 MG) BY MOUTH DAILY (Patient taking differently: Take 120 mg by mouth daily. ) 90 capsule 2   DULoxetine (CYMBALTA) 60 MG capsule Take 1 capsule (60 mg total) by mouth daily. (Patient taking differently: Take 60 mg by mouth at bedtime. ) 90 capsule 1   furosemide (LASIX) 20 MG tablet TAKE 1 TABLET BY MOUTH THREE TIMES DAILY 270 tablet 0   guaiFENesin (MUCINEX) 600 MG 12 hr tablet Take 2 tablets (1,200 mg total) by mouth 2 (two) times daily. For 7 days, then use as needed (Patient not taking: Reported on 04/18/2019) 60 tablet 1   linaclotide (LINZESS) 290 MCG  CAPS capsule Take 1 capsule (290 mcg total) by mouth daily as needed (constipation.). 30 capsule 0   loratadine (CLARITIN) 10 MG tablet Take 10 mg by mouth daily.     metoprolol tartrate (LOPRESSOR) 25 MG tablet Take 1 tablet (25 mg total) by mouth 2 (two) times daily. 60 tablet 3   Multiple Vitamin (MULTIVITAMIN WITH MINERALS) TABS tablet Take 1 tablet by mouth daily.     nitroGLYCERIN (NITROSTAT) 0.4 MG SL tablet Place 1 tablet (0.4 mg total) under the tongue every 5 (five) minutes as needed for chest pain. Reported on 06/12/2016 30 tablet 2   ondansetron (ZOFRAN) 4 MG tablet Take 1 tablet (4 mg total) by mouth 2 (two) times daily as needed for nausea  or vomiting. 40 tablet 0   oxyCODONE (OXY IR/ROXICODONE) 5 MG immediate release tablet Take 1 tablet (5 mg total) by mouth every 8 (eight) hours as needed for severe pain. 20 tablet 0   potassium chloride (K-DUR) 10 MEQ tablet TAKE 1 TABLET(10 MEQ) BY MOUTH DAILY (Patient taking differently: Take 10 mEq by mouth daily. ) 90 tablet 2   prochlorperazine (COMPAZINE) 10 MG tablet Take 1 tablet (10 mg total) by mouth every 6 (six) hours as needed for nausea or vomiting. 30 tablet 0   QUEtiapine (SEROQUEL) 100 MG tablet Take 1 tablet (100 mg total) by mouth at bedtime. 30 tablet 1   rOPINIRole (REQUIP) 0.25 MG tablet TAKE 2 TABLETS(0.5 MG) BY MOUTH AT BEDTIME (Patient taking differently: Take 0.5 mg by mouth at bedtime. ) 180 tablet 0   TRELEGY ELLIPTA 100-62.5-25 MCG/INH AEPB INHALE 1 PUFF INTO THE LUNGS DAILY (Patient taking differently: Inhale 1 puff into the lungs daily. ) 60 each 3   VENTOLIN HFA 108 (90 Base) MCG/ACT inhaler INHALE 2 PUFFS INTO THE LUNGS EVERY 6 HOURS AS NEEDED FOR WHEEZING OR SHORTNESS OF BREATH (Patient taking differently: Inhale 2 puffs into the lungs every 6 (six) hours as needed (wheezing/shortness of breath). ) 18 Bradley 0   No current facility-administered medications for this visit.     SURGICAL HISTORY:  Past Surgical History:  Procedure Laterality Date   A-FLUTTER ABLATION N/A 05/19/2017   Procedure: A-Flutter Ablation;  Surgeon: Evans Lance, MD;  Location: [facility];  Service: Cardiovascular;  Laterality: N/A;   APPENDECTOMY     BASAL CELL CARCINOMA EXCISION     CARDIAC CATHETERIZATION     CATARACT EXTRACTION Bilateral    CHOLECYSTECTOMY     COLONOSCOPY N/A 06/23/2014   Dr. Rourk:Rectal and colonic polyps-removed, Pancolonic diverticulosis. lymphocytic colitis   CORONARY ANGIOPLASTY WITH STENT PLACEMENT     ESOPHAGEAL DILATION N/A 03/13/2016   Procedure: ESOPHAGEAL DILATION;  Surgeon: Daneil Dolin, MD;  Location: [facility];  Service:  Endoscopy;  Laterality: N/A;   ESOPHAGOGASTRODUODENOSCOPY N/A 03/13/2016   Procedure: ESOPHAGOGASTRODUODENOSCOPY (EGD);  Surgeon: Daneil Dolin, MD;  Location: [facility];  Service: Endoscopy;  Laterality: N/A;  0930   EYE SURGERY     cataract removed, bilateral   IR THORACENTESIS ASP PLEURAL SPACE W/IMG GUIDE  02/15/2019   LUMBAR LAMINECTOMY/DECOMPRESSION MICRODISCECTOMY N/A 01/19/2018   Procedure: L4-5 DECOMPRESSION;  Surgeon: Marybelle Killings, MD;  Location: [facility];  Service: Orthopedics;  Laterality: N/A;   VIDEO ASSISTED THORACOSCOPY (VATS)/ LOBECTOMY Right 02/04/2019   Procedure: VIDEO ASSISTED THORACOSCOPY (VATS)/RIGHT UPPER AND MIDDLE LOBECTOMY;  Surgeon: Melrose Nakayama, MD;  Location: [facility];  Service: Thoracic;  Laterality: Right;   VIDEO BRONCHOSCOPY N/A  02/14/2019   Procedure: BRONCHOSCOPY WITH SEDATION;  Surgeon: Melrose Nakayama, MD;  Location: [facility];  Service: Thoracic;  Laterality: N/A;    REVIEW OF SYSTEMS:  A comprehensive review of systems was negative except for: Gastrointestinal: positive for nausea   PHYSICAL EXAMINATION: General appearance: alert, cooperative, fatigued and no distress Head: Normocephalic, without obvious abnormality, atraumatic Neck: no adenopathy, no JVD, supple, symmetrical, trachea midline and thyroid not enlarged, symmetric, no tenderness/mass/nodules Lymph nodes: Cervical, supraclavicular, and axillary nodes normal. Resp: clear to auscultation bilaterally Back: symmetric, no curvature. ROM normal. No CVA tenderness. Cardio: regular rate and rhythm, S1, S2 normal, no murmur, click, rub or gallop GI: soft, non-tender; bowel sounds normal; no masses,  no organomegaly Extremities: extremities normal, atraumatic, no cyanosis or edema  ECOG PERFORMANCE STATUS: 1 - Symptomatic but completely ambulatory  Blood pressure 99/60, pulse 69, temperature 98 F (36.7 C), temperature source Oral, resp. rate 17, height 5\' 2"  (1.575 m), weight 137 lb  6.4 oz (62.3 kg), SpO2 93 %.  LABORATORY DATA: Lab Results  Component Value Date   WBC 28.2 (H) 04/19/2019   HGB 12.6 04/19/2019   HCT 40.1 04/19/2019   MCV 89.7 04/19/2019   PLT 168 04/19/2019      Chemistry      Component Value Date/Time   NA 135 04/18/2019 1543   NA 137 05/12/2017 1142   K 4.2 04/18/2019 1543   CL 96 (L) 04/18/2019 1531   CO2 30 04/18/2019 1531   BUN 17 04/18/2019 1531   BUN 18 05/12/2017 1142   CREATININE 0.86 04/18/2019 1531   CREATININE 0.58 04/13/2019 0751   CREATININE 0.66 06/19/2018 1554      Component Value Date/Time   CALCIUM 8.8 (L) 04/18/2019 1531   ALKPHOS 147 (H) 04/18/2019 1531   AST 32 04/18/2019 1531   AST 15 04/13/2019 0751   ALT 71 (H) 04/18/2019 1531   ALT 16 04/13/2019 0751   BILITOT 0.8 04/18/2019 1531   BILITOT 0.3 04/13/2019 0751       RADIOGRAPHIC STUDIES: Dg Chest 2 View  Result Date: 04/18/2019 CLINICAL DATA:  Syncope, falls EXAM: CHEST - 2 VIEW COMPARISON:  04/12/2019 FINDINGS: Cardiomegaly. Unchanged elevation of the right hemidiaphragm with a possible small right pleural effusion. Disc degenerative disease of the thoracic spine. IMPRESSION: Cardiomegaly. Unchanged elevation of the right hemidiaphragm with a possible small right pleural effusion. Electronically Signed   By: Eddie Candle M.D.   On: 04/18/2019 16:20   Dg Chest 2 View  Result Date: 04/12/2019 CLINICAL DATA:  Post right VATS 02/04/2019.  Small cell lung cancer. EXAM: CHEST - 2 VIEW COMPARISON:  01/04/2018 FINDINGS: Postoperative changes on the right with volume loss. No confluent airspace opacities or significant effusions. Heart is normal size. No acute bony abnormality. IMPRESSION: Postoperative changes on the right with volume loss. No active disease. Electronically Signed   By: Rolm Baptise M.D.   On: 04/12/2019 12:52   Ct Head Wo Contrast  Result Date: 04/18/2019 CLINICAL DATA:  Multiple falls EXAM: CT HEAD WITHOUT CONTRAST CT CERVICAL SPINE WITHOUT  CONTRAST TECHNIQUE: Multidetector CT imaging of the head and cervical spine was performed following the standard protocol without intravenous contrast. Multiplanar CT image reconstructions of the cervical spine were also generated. COMPARISON:  MR brain, 04/05/2019 FINDINGS: CT HEAD FINDINGS Brain: No evidence of acute infarction, hemorrhage, hydrocephalus, extra-axial collection or mass lesion/mass effect. Vascular: No hyperdense vessel or unexpected calcification. Skull: Normal. Negative for fracture or focal lesion. Sinuses/Orbits: No  acute finding. Other: None. CT CERVICAL SPINE FINDINGS Alignment: Normal. Skull base and vertebrae: No acute fracture. No primary bone lesion or focal pathologic process. Soft tissues and spinal canal: No prevertebral fluid or swelling. No visible canal hematoma. Disc levels: Mild multilevel disc space height loss and osteophytosis. Upper chest: Negative. Other: None. IMPRESSION: 1.  No acute intracranial pathology. 2.  No fracture or static subluxation of the cervical spine. Electronically Signed   By: Eddie Candle M.D.   On: 04/18/2019 17:21   Ct Cervical Spine Wo Contrast  Result Date: 04/18/2019 CLINICAL DATA:  Multiple falls EXAM: CT HEAD WITHOUT CONTRAST CT CERVICAL SPINE WITHOUT CONTRAST TECHNIQUE: Multidetector CT imaging of the head and cervical spine was performed following the standard protocol without intravenous contrast. Multiplanar CT image reconstructions of the cervical spine were also generated. COMPARISON:  MR brain, 04/05/2019 FINDINGS: CT HEAD FINDINGS Brain: No evidence of acute infarction, hemorrhage, hydrocephalus, extra-axial collection or mass lesion/mass effect. Vascular: No hyperdense vessel or unexpected calcification. Skull: Normal. Negative for fracture or focal lesion. Sinuses/Orbits: No acute finding. Other: None. CT CERVICAL SPINE FINDINGS Alignment: Normal. Skull base and vertebrae: No acute fracture. No primary bone lesion or focal pathologic  process. Soft tissues and spinal canal: No prevertebral fluid or swelling. No visible canal hematoma. Disc levels: Mild multilevel disc space height loss and osteophytosis. Upper chest: Negative. Other: None. IMPRESSION: 1.  No acute intracranial pathology. 2.  No fracture or static subluxation of the cervical spine. Electronically Signed   By: Eddie Candle M.D.   On: 04/18/2019 17:21   Mr Jeri Cos DE Contrast  Result Date: 04/05/2019 CLINICAL DATA:  Recent diagnosis of small cell lung cancer with right upper lobectomy. Staging. EXAM: MRI HEAD WITHOUT AND WITH CONTRAST TECHNIQUE: Multiplanar, multiecho pulse sequences of the brain and surrounding structures were obtained without and with intravenous contrast. CONTRAST:  55mL MULTIHANCE GADOBENATE DIMEGLUMINE 529 MG/ML IV SOLN COMPARISON:  None. FINDINGS: Brain: Diffusion imaging does not show any acute or subacute infarction. No evidence of metastatic disease. Minimal small vessel change of the cerebral hemispheric white matter, less than often seen at this age. No cortical or large vessel territory insult. No hemorrhage, hydrocephalus or extra-axial collection. No abnormal contrast enhancement. Vascular: Major vessels at the base of the brain show flow. Skull and upper cervical spine: Negative Sinuses/Orbits: Clear/normal Other: None IMPRESSION: No evidence of metastatic disease. No acute or significant finding. Minimal small vessel change of the white matter, less than often seen at this age. Electronically Signed   By: Nelson Chimes M.D.   On: 04/05/2019 12:36    ASSESSMENT AND PLAN: This is a very pleasant 68 years old white female with limited stage, stage Ia small cell lung cancer right upper and middle bilobectomy. The patient is currently undergoing adjuvant systemic chemotherapy with cisplatin and etoposide status post 1 cycle started last week.  She tolerated the first week of her treatment well except for delayed nausea secondary to cisplatin. I  have offered the patient IV fluid today but she was in a rush to go out to get her wig.  I strongly recommend for her to increase her oral intake. For the nausea I recommended for her to continue with Compazine but I also added Decadron 4 mg p.o. twice daily for 5 days after chemotherapy. I will see her back for follow-up visit in 2 weeks for evaluation before the next cycle of her treatment. She was given prescription for the cranial prosthesis. The patient  was advised to call immediately if she has any concerning symptoms in the interval. The patient voices understanding of current disease status and treatment options and is in agreement with the current care plan.  All questions were answered. The patient knows to call the clinic with any problems, questions or concerns. We can certainly see the patient much sooner if necessary.  I spent 10 minutes counseling the patient face to face. The total time spent in the appointment was 15 minutes.  Disclaimer: This note was dictated with voice recognition software. Similar sounding words can inadvertently be transcribed and may not be corrected upon review.

## 2019-04-19 NOTE — Telephone Encounter (Signed)
Patient calling in checking on refill request. States she has 2-3 pills left.

## 2019-04-20 NOTE — ED Provider Notes (Signed)
[facility] EMERGENCY DEPARTMENT Provider Note   CSN: [id number] Arrival date & time: 04/18/19  1453    History   Chief Complaint Chief Complaint  Patient presents with  . Near Syncope  . Fall    HPI Tonya Bradley is a 68 y.o. female.     HPI  68 year old female comes in a chief complaint of dizziness and fall. She has history of cancer and is undergoing IV chemotherapy.  Patient states that for the last few days, after the chemotherapy she has been feeling weak.  She also has loss of appetite.  Today she had an episode of dizziness with near fainting and a resultant fall.  The symptoms occurred while she was at home. She denies any chest pain, shortness of breath.  From the fall itself she denies any severe trauma.  She is not on any blood thinners. Pt has no hx of PE, DVT and denies any exogenous hormone (testosterone / estrogen) use, long distance travels or surgery in the past 6 weeks, active cancer, recent immobilization. Patient also denies any nausea, vomiting, fevers, chills.  Past Medical History:  Diagnosis Date  . Anemia   . Anxiety   . Arthritis    "back"  . Atrial flutter ([place]) 03/13/2016  . CAD (coronary artery disease)   . Cancer ([place])    skin cancer  . Cataract   . Colon polyp   . Complication of anesthesia    BP dropped during esophagus diltation in 2017-admitted for 3 days.   Marland Kitchen COPD (chronic obstructive pulmonary disease) ([place])   . Depression   . Dysrhythmia   . Emphysema of lung ([place])   . GERD (gastroesophageal reflux disease)   . Hypercholesterolemia   . Lung cancer ([place])   . Osteoporosis   . Oxygen deficiency     Patient Active Problem List   Diagnosis Date Noted  . Goals of care, counseling/discussion 03/25/2019  . Encounter for antineoplastic chemotherapy 03/25/2019  . 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 S/P Right Upper Lobectomy, Right Middle Lobectomy 02/04/2019  . Colitis 01/01/2019  . Dehydration  01/01/2019  . Hypokalemia 01/01/2019  . Dysphonia 06/19/2018  . Bilateral lower extremity edema 02/16/2018  . Lumbar stenosis 01/19/2018  . GERD (gastroesophageal reflux disease) 11/18/2017  . 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 COPD (chronic obstructive pulmonary disease) ([place]) 09/09/2017  . Varicose veins of bilateral lower extremities with other complications 54/27/0623  . Chronic pain disorder 05/26/2017  . Back pain 05/26/2017  . Insomnia 05/26/2017  . Osteopenia 11/26/2016  . Other emphysema ([place]) 11/07/2016  . Chronic anxiety 11/07/2016  . Chronic hypoxemic respiratory failure ([place]) 11/07/2016  . Constipation 06/12/2016  . Right carotid bruit 04/03/2016  . CAD (coronary artery disease) 04/03/2016  . Tobacco abuse 04/03/2016  . Atrial flutter ([place]) 03/13/2016  . Hiatal hernia   . Reflux esophagitis   . Schatzki's ring   . Dysphagia 03/01/2016  . Solitary pulmonary nodule 03/01/2016  . 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 Microscopic colitis 08/15/2014  . Loose stools 06/08/2014  . BCC (basal cell carcinoma), face 09/13/2012    Past Surgical History:  Procedure Laterality Date  . A-FLUTTER ABLATION N/A 05/19/2017   Procedure: A-Flutter Ablation;  Surgeon: Evans Lance, MD;  Location: [facility] CV LAB;  Service: Cardiovascular;  Laterality: N/A;  . APPENDECTOMY    . BASAL CELL CARCINOMA EXCISION    . CARDIAC CATHETERIZATION    .  CATARACT EXTRACTION Bilateral   . CHOLECYSTECTOMY    . COLONOSCOPY N/A 06/23/2014   Dr. Rourk:Rectal and colonic polyps-removed, Pancolonic diverticulosis. lymphocytic colitis  . CORONARY ANGIOPLASTY WITH STENT PLACEMENT    . ESOPHAGEAL DILATION N/A 03/13/2016   Procedure: ESOPHAGEAL DILATION;  Surgeon: Daneil Dolin, MD;  [facility];  Service: Endoscopy;  Laterality: N/A;  . ESOPHAGOGASTRODUODENOSCOPY N/A 03/13/2016   Procedure: ESOPHAGOGASTRODUODENOSCOPY (EGD);  Surgeon: Daneil Dolin, MD;  Location: [facility];  Service: Endoscopy;  Laterality: N/A;  0930  . EYE SURGERY     cataract removed, bilateral  . IR THORACENTESIS ASP PLEURAL SPACE W/IMG GUIDE  02/15/2019  . LUMBAR LAMINECTOMY/DECOMPRESSION MICRODISCECTOMY N/A 01/19/2018   Procedure: L4-5 DECOMPRESSION;  Surgeon: Marybelle Killings, MD;  Location: [facility];  Service: Orthopedics;  Laterality: N/A;  . VIDEO ASSISTED THORACOSCOPY (VATS)/ LOBECTOMY Right 02/04/2019   Procedure: VIDEO ASSISTED THORACOSCOPY (VATS)/RIGHT UPPER AND MIDDLE LOBECTOMY;  Surgeon: Melrose Nakayama, MD;  Location: [facility];  Service: Thoracic;  Laterality: Right;  Marland Kitchen VIDEO BRONCHOSCOPY N/A 02/14/2019   Procedure: BRONCHOSCOPY WITH SEDATION;  Surgeon: Melrose Nakayama, MD;  Location: [facility];  Service: Thoracic;  Laterality: N/A;     OB History   No obstetric history on file.      Home Medications    Prior to Admission medications   Medication Sig Start Date End Date Taking? Authorizing Provider  atorvastatin (LIPITOR) 80 MG tablet TAKE 1 TABLET(80 MG) BY MOUTH DAILY Patient taking differently: Take 80 mg by mouth daily.  09/09/18  Yes Martinique, [name], MD  Cholecalciferol (VITAMIN D) 2000 units CAPS Take 2,000 Units by mouth daily.   Yes [provider]  cycloSPORINE (RESTASIS) 0.05 % ophthalmic emulsion Place 1 drop into both eyes 2 (two) times daily.   Yes [provider]  Dexlansoprazole (DEXILANT) 30 MG capsule Take 1 capsule (30 mg total) by mouth daily. 02/19/19  Yes Martinique, [name], MD  diltiazem (CARDIZEM CD) 120 MG 24 hr capsule TAKE 1 CAPSULE(120 MG) BY MOUTH DAILY Patient taking differently: Take 120 mg by mouth daily.  01/08/19  Yes Martinique, [name], MD  DULoxetine (CYMBALTA) 60 MG capsule Take 1 capsule (60 mg total) by mouth daily. Patient taking differently: Take 60 mg by mouth at bedtime.  10/06/18  Yes Martinique, [name], MD  furosemide (LASIX) 20 MG tablet TAKE 1 TABLET BY MOUTH THREE TIMES DAILY 04/13/19  Yes  Martinique, [name], MD  linaclotide [facility]) 290 MCG CAPS capsule Take 1 capsule (290 mcg total) by mouth daily as needed (constipation.). 03/16/19  Yes Martinique, [name], MD  loratadine (CLARITIN) 10 MG tablet Take 10 mg by mouth daily.   Yes [provider]  metoprolol tartrate (LOPRESSOR) 25 MG tablet Take 1 tablet (25 mg total) by mouth 2 (two) times daily. 02/18/19  Yes Barrett, Erin R, PA-C  Multiple Vitamin (MULTIVITAMIN WITH MINERALS) TABS tablet Take 1 tablet by mouth daily.   Yes [provider]  nitroGLYCERIN (NITROSTAT) 0.4 MG SL tablet Place 1 tablet (0.4 mg total) under the tongue every 5 (five) minutes as needed for chest pain. Reported on 06/12/2016 10/22/18  Yes Martinique, [name], MD  ondansetron (ZOFRAN) 4 MG tablet Take 1 tablet (4 mg total) by mouth 2 (two) times daily as needed for nausea or vomiting. 03/15/19  Yes Martinique, [name], MD  oxyCODONE (OXY IR/ROXICODONE) 5 MG immediate release tablet Take 1 tablet (5 mg total) by mouth every  8 (eight) hours as needed for severe pain. 04/14/19  Yes Curt Bears, MD  potassium chloride (K-DUR) 10 MEQ tablet TAKE 1 TABLET(10 MEQ) BY MOUTH DAILY Patient taking differently: Take 10 mEq by mouth daily.  08/14/18  Yes Martinique, [name], MD  prochlorperazine (COMPAZINE) 10 MG tablet Take 1 tablet (10 mg total) by mouth every 6 (six) hours as needed for nausea or vomiting. 03/25/19  Yes Curt Bears, MD  QUEtiapine (SEROQUEL) 100 MG tablet Take 1 tablet (100 mg total) by mouth at bedtime. 04/09/19  Yes Martinique, [name], MD  rOPINIRole (REQUIP) 0.25 MG tablet TAKE 2 TABLETS(0.5 MG) BY MOUTH AT BEDTIME Patient taking differently: Take 0.5 mg by mouth at bedtime.  03/15/19  Yes [place]ique, [name], MD  TRELEGY ELLIPTA 100-62.5-25 MCG/INH AEPB INHALE 1 PUFF INTO THE LUNGS DAILY Patient taking differently: Inhale 1 puff into the lungs daily.  12/29/18  Yes Byrum, Rose Fillers, MD  VENTOLIN HFA 108 (90 Base) MCG/ACT inhaler INHALE 2 PUFFS INTO THE  LUNGS EVERY 6 HOURS AS NEEDED FOR WHEEZING OR SHORTNESS OF BREATH Patient taking differently: Inhale 2 puffs into the lungs every 6 (six) hours as needed (wheezing/shortness of breath).  09/19/17  Yes Martinique, [name], MD  ALPRAZolam Duanne Moron) 0.5 MG tablet Take 1 tablet (0.5 mg total) by mouth 2 (two) times daily. 04/19/19   Martinique, [name], MD  amiodarone (PACERONE) 200 MG tablet TAKE 1/2 TABLET BY MOUTH ONCE DAILY Patient not taking: Reported on 04/18/2019 03/27/19   Evans Lance, MD  dexamethasone (DECADRON) 4 MG tablet 4 mg p.o. twice daily for 5 days after the chemotherapy. 04/19/19   Curt Bears, MD  guaiFENesin (MUCINEX) 600 MG 12 hr tablet Take 2 tablets (1,200 mg total) by mouth 2 (two) times daily. For 7 days, then use as needed Patient not taking: Reported on 04/18/2019 02/18/19   Barrett, Lodema Hong, PA-C  magnesium oxide (MAG-OX) 400 (241.3 Mg) MG tablet 1 tablet twice daily 04/19/19   Curt Bears, MD    Family History Family History  Problem Relation Age of Onset  . Breast cancer Sister 35  . Heart disease Sister   . Heart attack Father   . Heart disease Father   . Stroke Mother   . Heart disease Mother   . Hyperlipidemia Mother   . Heart attack Sister   . Heart disease Brother        ?heart failure  . Congestive Heart Failure Brother   . Hypertension Son   . Heart disease Maternal Grandfather   . Cancer Cousin        lymphoma  . Hypertension Son   . Stomach cancer Other   . Colon cancer Neg Hx   . Pancreatic cancer Neg Hx     Social History Social History   Tobacco Use  . Smoking status: Current Every Day Smoker    Packs/day: 0.25    Years: 54.00    Pack years: 13.50    Types: Cigarettes    Start date: 06/14/1966  . Smokeless tobacco: Never Used  . Tobacco comment: 1-2 packs daily. Chantix too expensive; pt states that she quit 2 weeks ago (02/04/19)   Substance Use Topics  . Alcohol use: No    Alcohol/week: 0.0 standard drinks  . Drug use: No      Allergies   Morphine and related   Review of Systems Review of Systems  Constitutional: Positive for activity change.  Respiratory: Negative for cough and shortness  of breath.   Cardiovascular: Negative for chest pain.  Gastrointestinal: Negative for abdominal pain.  Genitourinary: Negative for dysuria.  Allergic/Immunologic: Positive for immunocompromised state.  Hematological: Does not bruise/bleed easily.  All other systems reviewed and are negative.    Physical Exam Updated Vital Signs BP 120/66   Pulse 69   Resp 15   Ht 5\' 2"  (1.575 m)   Wt 62.6 kg   SpO2 100%   BMI 25.24 kg/m   Physical Exam Vitals signs and nursing note reviewed.  Constitutional:      Appearance: She is well-developed.  HENT:     Head: Normocephalic and atraumatic.  Eyes:     Pupils: Pupils are equal, round, and reactive to light.  Neck:     Musculoskeletal: Neck supple.  Cardiovascular:     Rate and Rhythm: Normal rate and regular rhythm.     Heart sounds: Normal heart sounds. No murmur.  Pulmonary:     Effort: Pulmonary effort is normal. No respiratory distress.  Abdominal:     General: There is no distension.     Palpations: Abdomen is soft.     Tenderness: There is no abdominal tenderness. There is no guarding or rebound.  Skin:    General: Skin is warm and dry.  Neurological:     Mental Status: She is alert and oriented to person, place, and time.      ED Treatments / Results  Labs (all labs ordered are listed, but only abnormal results are displayed) Labs Reviewed  URINE CULTURE - Abnormal; Notable for the following components:      Result Value   Culture MULTIPLE SPECIES PRESENT, SUGGEST RECOLLECTION (*)    All other components within normal limits  COMPREHENSIVE METABOLIC PANEL - Abnormal; Notable for the following components:   Chloride 96 (*)    Glucose, Bld 139 (*)    Calcium 8.8 (*)    Total Protein 6.0 (*)    Albumin 3.1 (*)    ALT 71 (*)    Alkaline  Phosphatase 147 (*)    All other components within normal limits  CBC WITH DIFFERENTIAL/PLATELET - Abnormal; Notable for the following components:   WBC 33.2 (*)    Neutro Abs 28.2 (*)    Monocytes Absolute 0.0 (*)    Abs Immature Granulocytes 3.16 (*)    All other components within normal limits  PHOSPHORUS - Abnormal; Notable for the following components:   Phosphorus 5.2 (*)    All other components within normal limits  POCT I-STAT EG7 - Abnormal; Notable for the following components:   pO2, Ven 69.0 (*)    Bicarbonate 34.8 (*)    TCO2 36 (*)    Acid-Base Excess 9.0 (*)    Calcium, Ion 1.01 (*)    All other components within normal limits  CULTURE, BLOOD (ROUTINE X 2)  CULTURE, BLOOD (ROUTINE X 2)  LACTIC ACID, PLASMA  URINALYSIS, ROUTINE W REFLEX MICROSCOPIC  URIC ACID  MAGNESIUM  I-STAT VENOUS BLOOD GAS, ED    EKG EKG Interpretation  Date/Time:  Sunday Apr 18 2019 15:28:06 EDT Ventricular Rate:  57 PR Interval:    QRS Duration: 97 QT Interval:  541 QTC Calculation: 527 R Axis:   -86 Text Interpretation:  Sinus rhythm Left anterior fascicular block Abnormal R-wave progression, early transition Borderline T abnormalities, anterior leads Prolonged QT interval Nonspecific ST abnormality Prolonged QT is new Confirmed by Varney Biles ([id number]) on 04/18/2019 4:15:14 PM Also confirmed by Varney Biles [phone]), editor  Lynder Parents [phone])  on 04/19/2019 9:03:41 AM   Radiology No results found.  Procedures Procedures (including critical care time)  Medications Ordered in ED Medications  lactated ringers bolus 1,000 mL (0 mLs Intravenous Stopped 04/18/19 1652)    And  lactated ringers bolus 1,000 mL (0 mLs Intravenous Stopped 04/18/19 1839)     Initial Impression / Assessment and Plan / ED Course  I have reviewed the triage vital signs and the nursing notes.  Pertinent labs & imaging results that were available during my care of the patient were reviewed by me  and considered in my medical decision making (see chart for details).        68 year old female with history of cancer comes in a chief complaint of fall and dizziness.  She appears to have no infection causing the weakness.  She also does not appear to have a PE based on her history and exam.  No signs of DVT on exam.  She was hypotensive at home and noted to be hypertensive at arrival.  When we attempted orthostatic she did get dizzy.  Likely her symptoms are because of dehydration.  Her white count is elevated, however she was started on Neulasta which likely led to the elevated white count.  Patient was advised to stay in the hospital and be observed given her near syncope with hypotension arrival.  Patient prefers going home and following outpatient with her cancer doctor.  The patient appears reasonably screened and/or stabilized for discharge and I doubt any other medical condition or other [facility] requiring further screening, evaluation, or treatment in the ED at this time prior to discharge.   Results from the ER workup discussed with the patient face to face and all questions answered to the best of my ability. The patient is safe for discharge with strict return precautions.   Final Clinical Impressions(s) / ED Diagnoses   Final diagnoses:  Leukocytosis, unspecified type  Near syncope  Dehydration  Fall, initial encounter    ED Discharge Orders    None       Varney Biles, MD 04/20/19 2211

## 2019-04-21 ENCOUNTER — Telehealth: Payer: Self-pay | Admitting: *Deleted

## 2019-04-21 ENCOUNTER — Telehealth: Payer: Self-pay | Admitting: Internal Medicine

## 2019-04-21 ENCOUNTER — Other Ambulatory Visit: Payer: Self-pay

## 2019-04-21 ENCOUNTER — Telehealth: Payer: Medicare HMO | Admitting: Internal Medicine

## 2019-04-21 NOTE — Telephone Encounter (Signed)
New message:    Patient calling about her appt she states some one called and the phone hung up.

## 2019-04-21 NOTE — Telephone Encounter (Signed)
Received vm message from patient about her nausea medications. TCT patient and spoke with her.  She states that her nausea is still present and is asking if she can have anything else for the nausea. Denies vomiting/diarrhea/fever/SOB. She states she is drinking some but can only tolerate warm fluids like coffee. Advised that coffee does not count as hydration fluids d/t caffeine content. Suggested chicken/beef broth, room temperature drinks w/o caffeine.  Pt has tried zofran-states it does not help. She takes the compazine 10 mg every 6 hours and has completed the decadron she takes for 5 days after her chemo.   She states she is feeling a bit better this afternoon. Encouraged pt to stay hydrated and to eat foods she can tolerate-small amounts but with more frequency. Pt states she will do that and said she would call back if she does not improve.  She was feeling much optimistic by end of conversation.

## 2019-04-23 ENCOUNTER — Other Ambulatory Visit: Payer: Self-pay | Admitting: Family Medicine

## 2019-04-23 ENCOUNTER — Other Ambulatory Visit: Payer: Self-pay | Admitting: Internal Medicine

## 2019-04-23 DIAGNOSIS — R110 Nausea: Secondary | ICD-10-CM

## 2019-04-23 LAB — CULTURE, BLOOD (ROUTINE X 2)
Culture: NO GROWTH
Culture: NO GROWTH
Special Requests: ADEQUATE
Special Requests: ADEQUATE

## 2019-04-24 ENCOUNTER — Other Ambulatory Visit: Payer: Self-pay | Admitting: Family Medicine

## 2019-04-27 ENCOUNTER — Telehealth: Payer: Self-pay | Admitting: Internal Medicine

## 2019-04-27 ENCOUNTER — Inpatient Hospital Stay: Payer: Medicare HMO

## 2019-04-27 ENCOUNTER — Other Ambulatory Visit: Payer: Self-pay

## 2019-04-27 DIAGNOSIS — Z5111 Encounter for antineoplastic chemotherapy: Secondary | ICD-10-CM | POA: Diagnosis not present

## 2019-04-27 DIAGNOSIS — Z5189 Encounter for other specified aftercare: Secondary | ICD-10-CM | POA: Diagnosis not present

## 2019-04-27 DIAGNOSIS — C3491 Malignant neoplasm of unspecified part of right bronchus or lung: Secondary | ICD-10-CM

## 2019-04-27 DIAGNOSIS — C3411 Malignant neoplasm of upper lobe, right bronchus or lung: Secondary | ICD-10-CM | POA: Diagnosis not present

## 2019-04-27 LAB — CBC WITH DIFFERENTIAL (CANCER CENTER ONLY)
Abs Immature Granulocytes: 5.83 10*3/uL — ABNORMAL HIGH (ref 0.00–0.07)
Basophils Absolute: 0 10*3/uL (ref 0.0–0.1)
Basophils Relative: 0 %
Eosinophils Absolute: 0.1 10*3/uL (ref 0.0–0.5)
Eosinophils Relative: 0 %
HCT: 36.9 % (ref 36.0–46.0)
Hemoglobin: 11.6 g/dL — ABNORMAL LOW (ref 12.0–15.0)
Immature Granulocytes: 16 %
Lymphocytes Relative: 19 %
Lymphs Abs: 6.9 10*3/uL — ABNORMAL HIGH (ref 0.7–4.0)
MCH: 28.2 pg (ref 26.0–34.0)
MCHC: 31.4 g/dL (ref 30.0–36.0)
MCV: 89.6 fL (ref 80.0–100.0)
Monocytes Absolute: 3.9 10*3/uL — ABNORMAL HIGH (ref 0.1–1.0)
Monocytes Relative: 11 %
Neutro Abs: 19.9 10*3/uL — ABNORMAL HIGH (ref 1.7–7.7)
Neutrophils Relative %: 54 %
Platelet Count: 174 10*3/uL (ref 150–400)
RBC: 4.12 MIL/uL (ref 3.87–5.11)
RDW: 14.5 % (ref 11.5–15.5)
WBC Count: 36.7 10*3/uL — ABNORMAL HIGH (ref 4.0–10.5)
nRBC: 0 % (ref 0.0–0.2)

## 2019-04-27 LAB — CMP (CANCER CENTER ONLY)
ALT: 24 U/L (ref 0–44)
AST: 27 U/L (ref 15–41)
Albumin: 3.5 g/dL (ref 3.5–5.0)
Alkaline Phosphatase: 192 U/L — ABNORMAL HIGH (ref 38–126)
Anion gap: 10 (ref 5–15)
BUN: 18 mg/dL (ref 8–23)
CO2: 34 mmol/L — ABNORMAL HIGH (ref 22–32)
Calcium: 9.6 mg/dL (ref 8.9–10.3)
Chloride: 100 mmol/L (ref 98–111)
Creatinine: 1.07 mg/dL — ABNORMAL HIGH (ref 0.44–1.00)
GFR, Est AFR Am: >60 mL/min (ref >60)
GFR, Estimated: 53 mL/min — ABNORMAL LOW (ref >60)
Glucose, Bld: 95 mg/dL (ref 70–99)
Potassium: 3.6 mmol/L (ref 3.5–5.1)
Sodium: 144 mmol/L (ref 135–145)
Total Bilirubin: <0.2 mg/dL — ABNORMAL LOW (ref 0.3–1.2)
Total Protein: 6.5 g/dL (ref 6.5–8.1)

## 2019-04-27 LAB — MAGNESIUM: Magnesium: 2 mg/dL (ref 1.7–2.4)

## 2019-04-27 NOTE — Telephone Encounter (Signed)
MM AM PAL 6/22 moved 6/22 appointment to 6/24 and adjusted remain days in cycle - ok per MM. Confirmed with patient.

## 2019-05-03 ENCOUNTER — Telehealth: Payer: Self-pay | Admitting: Internal Medicine

## 2019-05-03 ENCOUNTER — Encounter: Payer: Self-pay | Admitting: Internal Medicine

## 2019-05-03 ENCOUNTER — Inpatient Hospital Stay: Payer: Medicare HMO

## 2019-05-03 ENCOUNTER — Inpatient Hospital Stay (HOSPITAL_BASED_OUTPATIENT_CLINIC_OR_DEPARTMENT_OTHER): Payer: Medicare HMO | Admitting: Internal Medicine

## 2019-05-03 ENCOUNTER — Inpatient Hospital Stay: Payer: Medicare HMO | Attending: Internal Medicine

## 2019-05-03 ENCOUNTER — Other Ambulatory Visit: Payer: Self-pay

## 2019-05-03 VITALS — BP 123/72 | HR 85 | Temp 98.7°F | Resp 18 | Ht 62.0 in | Wt 141.7 lb

## 2019-05-03 DIAGNOSIS — B370 Candidal stomatitis: Secondary | ICD-10-CM | POA: Insufficient documentation

## 2019-05-03 DIAGNOSIS — B3781 Candidal esophagitis: Secondary | ICD-10-CM | POA: Insufficient documentation

## 2019-05-03 DIAGNOSIS — Z720 Tobacco use: Secondary | ICD-10-CM | POA: Insufficient documentation

## 2019-05-03 DIAGNOSIS — K219 Gastro-esophageal reflux disease without esophagitis: Secondary | ICD-10-CM | POA: Diagnosis not present

## 2019-05-03 DIAGNOSIS — R5383 Other fatigue: Secondary | ICD-10-CM | POA: Diagnosis not present

## 2019-05-03 DIAGNOSIS — Z5111 Encounter for antineoplastic chemotherapy: Secondary | ICD-10-CM | POA: Diagnosis not present

## 2019-05-03 DIAGNOSIS — I2510 Atherosclerotic heart disease of native coronary artery without angina pectoris: Secondary | ICD-10-CM | POA: Insufficient documentation

## 2019-05-03 DIAGNOSIS — J439 Emphysema, unspecified: Secondary | ICD-10-CM | POA: Diagnosis not present

## 2019-05-03 DIAGNOSIS — E7800 Pure hypercholesterolemia, unspecified: Secondary | ICD-10-CM | POA: Diagnosis not present

## 2019-05-03 DIAGNOSIS — R635 Abnormal weight gain: Secondary | ICD-10-CM | POA: Diagnosis not present

## 2019-05-03 DIAGNOSIS — Z79899 Other long term (current) drug therapy: Secondary | ICD-10-CM | POA: Diagnosis not present

## 2019-05-03 DIAGNOSIS — F329 Major depressive disorder, single episode, unspecified: Secondary | ICD-10-CM | POA: Diagnosis not present

## 2019-05-03 DIAGNOSIS — C3411 Malignant neoplasm of upper lobe, right bronchus or lung: Secondary | ICD-10-CM | POA: Insufficient documentation

## 2019-05-03 DIAGNOSIS — R110 Nausea: Secondary | ICD-10-CM

## 2019-05-03 DIAGNOSIS — Z85028 Personal history of other malignant neoplasm of stomach: Secondary | ICD-10-CM | POA: Insufficient documentation

## 2019-05-03 DIAGNOSIS — Z5189 Encounter for other specified aftercare: Secondary | ICD-10-CM | POA: Diagnosis not present

## 2019-05-03 DIAGNOSIS — M810 Age-related osteoporosis without current pathological fracture: Secondary | ICD-10-CM | POA: Diagnosis not present

## 2019-05-03 DIAGNOSIS — C3491 Malignant neoplasm of unspecified part of right bronchus or lung: Secondary | ICD-10-CM

## 2019-05-03 DIAGNOSIS — R05 Cough: Secondary | ICD-10-CM

## 2019-05-03 DIAGNOSIS — I4892 Unspecified atrial flutter: Secondary | ICD-10-CM | POA: Diagnosis not present

## 2019-05-03 DIAGNOSIS — Z86010 Personal history of colonic polyps: Secondary | ICD-10-CM | POA: Diagnosis not present

## 2019-05-03 LAB — CMP (CANCER CENTER ONLY)
ALT: 17 U/L (ref 0–44)
AST: 17 U/L (ref 15–41)
Albumin: 2.9 g/dL — ABNORMAL LOW (ref 3.5–5.0)
Alkaline Phosphatase: 225 U/L — ABNORMAL HIGH (ref 38–126)
Anion gap: 10 (ref 5–15)
BUN: 15 mg/dL (ref 8–23)
CO2: 33 mmol/L — ABNORMAL HIGH (ref 22–32)
Calcium: 9.1 mg/dL (ref 8.9–10.3)
Chloride: 99 mmol/L (ref 98–111)
Creatinine: 0.83 mg/dL (ref 0.44–1.00)
GFR, Est AFR Am: >60 mL/min (ref >60)
GFR, Estimated: >60 mL/min (ref >60)
Glucose, Bld: 112 mg/dL — ABNORMAL HIGH (ref 70–99)
Potassium: 3.8 mmol/L (ref 3.5–5.1)
Sodium: 142 mmol/L (ref 135–145)
Total Bilirubin: <0.2 mg/dL — ABNORMAL LOW (ref 0.3–1.2)
Total Protein: 6.4 g/dL — ABNORMAL LOW (ref 6.5–8.1)

## 2019-05-03 LAB — CBC WITH DIFFERENTIAL (CANCER CENTER ONLY)
Abs Immature Granulocytes: 2.42 10*3/uL — ABNORMAL HIGH (ref 0.00–0.07)
Basophils Absolute: 0 10*3/uL (ref 0.0–0.1)
Basophils Relative: 0 %
Eosinophils Absolute: 0 10*3/uL (ref 0.0–0.5)
Eosinophils Relative: 0 %
HCT: 36 % (ref 36.0–46.0)
Hemoglobin: 11.2 g/dL — ABNORMAL LOW (ref 12.0–15.0)
Immature Granulocytes: 10 %
Lymphocytes Relative: 11 %
Lymphs Abs: 2.6 10*3/uL (ref 0.7–4.0)
MCH: 28.1 pg (ref 26.0–34.0)
MCHC: 31.1 g/dL (ref 30.0–36.0)
MCV: 90.5 fL (ref 80.0–100.0)
Monocytes Absolute: 1.9 10*3/uL — ABNORMAL HIGH (ref 0.1–1.0)
Monocytes Relative: 8 %
Neutro Abs: 17 10*3/uL — ABNORMAL HIGH (ref 1.7–7.7)
Neutrophils Relative %: 71 %
Platelet Count: 521 10*3/uL — ABNORMAL HIGH (ref 150–400)
RBC: 3.98 MIL/uL (ref 3.87–5.11)
RDW: 14.7 % (ref 11.5–15.5)
WBC Count: 24.1 10*3/uL — ABNORMAL HIGH (ref 4.0–10.5)
nRBC: 0.1 % (ref 0.0–0.2)

## 2019-05-03 LAB — MAGNESIUM: Magnesium: 2.1 mg/dL (ref 1.7–2.4)

## 2019-05-03 MED ORDER — PALONOSETRON HCL INJECTION 0.25 MG/5ML
INTRAVENOUS | Status: AC
  Filled 2019-05-03: qty 5

## 2019-05-03 MED ORDER — SODIUM CHLORIDE 0.9 % IV SOLN
100.0000 mg/m2 | Freq: Once | INTRAVENOUS | Status: AC
  Administered 2019-05-03: 170 mg via INTRAVENOUS
  Filled 2019-05-03: qty 8.5

## 2019-05-03 MED ORDER — PROCHLORPERAZINE MALEATE 10 MG PO TABS
10.0000 mg | ORAL_TABLET | Freq: Four times a day (QID) | ORAL | Status: DC | PRN
  Administered 2019-05-03: 10 mg via ORAL

## 2019-05-03 MED ORDER — SODIUM CHLORIDE 0.9 % IV SOLN
80.0000 mg/m2 | Freq: Once | INTRAVENOUS | Status: AC
  Administered 2019-05-03: 136 mg via INTRAVENOUS
  Filled 2019-05-03: qty 136

## 2019-05-03 MED ORDER — POTASSIUM CHLORIDE 2 MEQ/ML IV SOLN
Freq: Once | INTRAVENOUS | Status: AC
  Administered 2019-05-03: 10:00:00 via INTRAVENOUS
  Filled 2019-05-03: qty 10

## 2019-05-03 MED ORDER — SODIUM CHLORIDE 0.9 % IV SOLN
Freq: Once | INTRAVENOUS | Status: AC
  Administered 2019-05-03: 09:00:00 via INTRAVENOUS
  Filled 2019-05-03: qty 250

## 2019-05-03 MED ORDER — SODIUM CHLORIDE 0.9 % IV SOLN
Freq: Once | INTRAVENOUS | Status: AC
  Administered 2019-05-03: 12:00:00 via INTRAVENOUS
  Filled 2019-05-03: qty 5

## 2019-05-03 MED ORDER — PALONOSETRON HCL INJECTION 0.25 MG/5ML
0.2500 mg | Freq: Once | INTRAVENOUS | Status: AC
  Administered 2019-05-03: 0.25 mg via INTRAVENOUS

## 2019-05-03 MED ORDER — PROCHLORPERAZINE MALEATE 10 MG PO TABS
ORAL_TABLET | ORAL | Status: AC
  Filled 2019-05-03: qty 1

## 2019-05-03 NOTE — Patient Instructions (Signed)
[facility] Discharge Instructions for Patients Receiving Chemotherapy  Today you received the following chemotherapy agents cisplatin and etoposide   To help prevent nausea and vomiting after your treatment, we encourage you to take your nausea medication as directed  If you develop nausea and vomiting that is not controlled by your nausea medication, call the clinic.   BELOW ARE SYMPTOMS THAT SHOULD BE REPORTED IMMEDIATELY:  *FEVER GREATER THAN 100.5 F  *CHILLS WITH OR WITHOUT FEVER  NAUSEA AND VOMITING THAT IS NOT CONTROLLED WITH YOUR NAUSEA MEDICATION  *UNUSUAL SHORTNESS OF BREATH  *UNUSUAL BRUISING OR BLEEDING  TENDERNESS IN MOUTH AND THROAT WITH OR WITHOUT PRESENCE OF ULCERS  *URINARY PROBLEMS  *BOWEL PROBLEMS  UNUSUAL RASH Items with * indicate a potential emergency and should be followed up as soon as possible.  Feel free to call the clinic should you have any questions or concerns. The clinic phone number is [phone].  Please show the [facility] at check-in to the Emergency Department and triage nurse.

## 2019-05-03 NOTE — Progress Notes (Signed)
Pt c/o of nausea. Sandi Mealy, PA notified compazine po ordered and given

## 2019-05-03 NOTE — Telephone Encounter (Signed)
Scheduled appt per 6/1 los - added additional cycles per LOS - pt to get an updated schedule in treatment area .

## 2019-05-03 NOTE — Progress Notes (Signed)
[facility] [phone]   [phone]  OFFICE PROGRESS NOTE  Martinique, Tonya G, MD [place] [address]  DIAGNOSIS: limited stage, stage Ia (T1b, N0, M0) small cell lung cancer   PRIOR THERAPY: status post right upper and middle bilobectomy with lymph node dissection under the care of Dr. [name] January 31, 2019.  CURRENT THERAPY: chemotherapy with cisplatin 80 mg/M2 on day 1 and etoposide 100 mg/M2 on days 1, 2 and 3 every 3 weeks.  Status post 1 cycle.  INTERVAL HISTORY: Tonya Bradley 68 y.o. female returns to the clinic today for follow-up visit.  The patient is feeling fine today with no concerning complaints except for mild fatigue.  She tolerated her first cycle of systemic chemotherapy fairly well except for delayed nausea.  She is currently having Compazine, Zofran as well as Decadron for delayed nausea.  She denied having any chest pain, shortness of breath but continues to have mild cough with no hemoptysis.  She denied having any fever or chills.  She has no recent weight loss or night sweats.  She is here today for evaluation before starting cycle #2 of her treatment.   MEDICAL HISTORY: Past Medical History:  Diagnosis Date   Anemia    Anxiety    Arthritis    "back"   Atrial flutter ([place]) 03/13/2016   CAD (coronary artery disease)    Cancer (HCC)    skin cancer   Cataract    Colon polyp    Complication of anesthesia    BP dropped during esophagus diltation in 2017-admitted for 3 days.    COPD (chronic obstructive pulmonary disease) (HCC)    Depression    Dysrhythmia    Emphysema of lung (HCC)    GERD (gastroesophageal reflux disease)    Hypercholesterolemia    Lung cancer (HCC)    Osteoporosis    Oxygen deficiency     ALLERGIES:  is allergic to morphine and related.  MEDICATIONS:  Current Outpatient Medications  Medication Sig Dispense Refill   ALPRAZolam (XANAX) 0.5 MG  tablet Take 1 tablet (0.5 mg total) by mouth 2 (two) times daily. 60 tablet 2   amiodarone (PACERONE) 200 MG tablet TAKE 1/2 TABLET BY MOUTH ONCE DAILY (Patient not taking: Reported on 04/18/2019) 90 tablet 3   atorvastatin (LIPITOR) 80 MG tablet TAKE 1 TABLET(80 MG) BY MOUTH DAILY 90 tablet 1   Cholecalciferol (VITAMIN D) 2000 units CAPS Take 2,000 Units by mouth daily.     cycloSPORINE (RESTASIS) 0.05 % ophthalmic emulsion Place 1 drop into both eyes 2 (two) times daily.     dexamethasone (DECADRON) 4 MG tablet 4 mg p.o. twice daily for 5 days after the chemotherapy. 30 tablet 0   Dexlansoprazole (DEXILANT) 30 MG capsule Take 1 capsule (30 mg total) by mouth daily. 90 capsule 3   diltiazem (CARDIZEM CD) 120 MG 24 hr capsule TAKE 1 CAPSULE(120 MG) BY MOUTH DAILY (Patient taking differently: Take 120 mg by mouth daily. ) 90 capsule 2   DULoxetine (CYMBALTA) 60 MG capsule Take 1 capsule (60 mg total) by mouth daily. (Patient taking differently: Take 60 mg by mouth at bedtime. ) 90 capsule 1   furosemide (LASIX) 20 MG tablet TAKE 1 TABLET BY MOUTH THREE TIMES DAILY 270 tablet 0   guaiFENesin (MUCINEX) 600 MG 12 hr tablet Take 2 tablets (1,200 mg total) by mouth 2 (two) times daily. For 7 days, then use as needed (  Patient not taking: Reported on 04/18/2019) 60 tablet 1   LINZESS 290 MCG CAPS capsule TAKE 1 CAPSULE(290 MCG) BY MOUTH DAILY AS NEEDED FOR CONSTIPATION 30 capsule 0   loratadine (CLARITIN) 10 MG tablet Take 10 mg by mouth daily.     magnesium oxide (MAG-OX) 400 (241.3 Mg) MG tablet 1 tablet twice daily 60 tablet 1   metoprolol tartrate (LOPRESSOR) 25 MG tablet Take 1 tablet (25 mg total) by mouth 2 (two) times daily. 60 tablet 3   Multiple Vitamin (MULTIVITAMIN WITH MINERALS) TABS tablet Take 1 tablet by mouth daily.     nitroGLYCERIN (NITROSTAT) 0.4 MG SL tablet Place 1 tablet (0.4 mg total) under the tongue every 5 (five) minutes as needed for chest pain. Reported on 06/12/2016  30 tablet 2   ondansetron (ZOFRAN) 4 MG tablet TAKE 1 TABLET(4 MG) BY MOUTH TWICE DAILY AS NEEDED FOR NAUSEA OR VOMITING 40 tablet 0   oxyCODONE (OXY IR/ROXICODONE) 5 MG immediate release tablet Take 1 tablet (5 mg total) by mouth every 8 (eight) hours as needed for severe pain. 20 tablet 0   potassium chloride (K-DUR) 10 MEQ tablet TAKE 1 TABLET(10 MEQ) BY MOUTH DAILY (Patient taking differently: Take 10 mEq by mouth daily. ) 90 tablet 2   prochlorperazine (COMPAZINE) 10 MG tablet TAKE 1 TABLET(10 MG) BY MOUTH EVERY 6 HOURS AS NEEDED FOR NAUSEA OR VOMITING 30 tablet 0   QUEtiapine (SEROQUEL) 100 MG tablet Take 1 tablet (100 mg total) by mouth at bedtime. 30 tablet 1   rOPINIRole (REQUIP) 0.25 MG tablet TAKE 2 TABLETS(0.5 MG) BY MOUTH AT BEDTIME (Patient taking differently: Take 0.5 mg by mouth at bedtime. ) 180 tablet 0   TRELEGY ELLIPTA 100-62.5-25 MCG/INH AEPB INHALE 1 PUFF INTO THE LUNGS DAILY (Patient taking differently: Inhale 1 puff into the lungs daily. ) 60 each 3   VENTOLIN HFA 108 (90 Base) MCG/ACT inhaler INHALE 2 PUFFS INTO THE LUNGS EVERY 6 HOURS AS NEEDED FOR WHEEZING OR SHORTNESS OF BREATH (Patient taking differently: Inhale 2 puffs into the lungs every 6 (six) hours as needed (wheezing/shortness of breath). ) 18 Bradley 0   No current facility-administered medications for this visit.     SURGICAL HISTORY:  Past Surgical History:  Procedure Laterality Date   A-FLUTTER ABLATION N/A 05/19/2017   Procedure: A-Flutter Ablation;  Surgeon: Evans Lance, MD;  Location: [facility];  Service: Cardiovascular;  Laterality: N/A;   APPENDECTOMY     BASAL CELL CARCINOMA EXCISION     CARDIAC CATHETERIZATION     CATARACT EXTRACTION Bilateral    CHOLECYSTECTOMY     COLONOSCOPY N/A 06/23/2014   Dr. Rourk:Rectal and colonic polyps-removed, Pancolonic diverticulosis. lymphocytic colitis   CORONARY ANGIOPLASTY WITH STENT PLACEMENT     ESOPHAGEAL DILATION N/A 03/13/2016    Procedure: ESOPHAGEAL DILATION;  Surgeon: Daneil Dolin, MD;  [facility];  Service: Endoscopy;  Laterality: N/A;   ESOPHAGOGASTRODUODENOSCOPY N/A 03/13/2016   Procedure: ESOPHAGOGASTRODUODENOSCOPY (EGD);  Surgeon: Daneil Dolin, MD;  [facility]: [facility];  Service: Endoscopy;  Laterality: N/A;  0930   EYE SURGERY     cataract removed, bilateral   IR THORACENTESIS ASP PLEURAL SPACE W/IMG GUIDE  02/15/2019   LUMBAR LAMINECTOMY/DECOMPRESSION MICRODISCECTOMY N/A 01/19/2018   Procedure: L4-5 DECOMPRESSION;  Surgeon: Marybelle Killings, MD;  Location: [facility];  Service: Orthopedics;  Laterality: N/A;   VIDEO ASSISTED THORACOSCOPY (VATS)/ LOBECTOMY Right 02/04/2019   Procedure: VIDEO ASSISTED THORACOSCOPY (VATS)/RIGHT UPPER AND  MIDDLE LOBECTOMY;  Surgeon: Melrose Nakayama, MD;  Location: [facility];  Service: Thoracic;  Laterality: Right;   VIDEO BRONCHOSCOPY N/A 02/14/2019   Procedure: BRONCHOSCOPY WITH SEDATION;  Surgeon: Melrose Nakayama, MD;  Location: [facility];  Service: Thoracic;  Laterality: N/A;    REVIEW OF SYSTEMS:  A comprehensive review of systems was negative except for: Respiratory: positive for cough Gastrointestinal: positive for nausea   PHYSICAL EXAMINATION: General appearance: alert, cooperative and no distress Head: Normocephalic, without obvious abnormality, atraumatic Neck: no adenopathy, no JVD, supple, symmetrical, trachea midline and thyroid not enlarged, symmetric, no tenderness/mass/nodules Lymph nodes: Cervical, supraclavicular, and axillary nodes normal. Resp: clear to auscultation bilaterally Back: symmetric, no curvature. ROM normal. No CVA tenderness. Cardio: regular rate and rhythm, S1, S2 normal, no murmur, click, rub or gallop GI: soft, non-tender; bowel sounds normal; no masses,  no organomegaly Extremities: extremities normal, atraumatic, no cyanosis or edema  ECOG PERFORMANCE STATUS: 1 - Symptomatic but completely ambulatory  Blood pressure  123/72, pulse 85, temperature 98.7 F (37.1 C), temperature source Oral, resp. rate 18, height 5\' 2"  (1.575 m), weight 141 lb 11.2 oz (64.3 kg), SpO2 100 %.  LABORATORY DATA: Lab Results  Component Value Date   WBC 24.1 (H) 05/03/2019   HGB 11.2 (L) 05/03/2019   HCT 36.0 05/03/2019   MCV 90.5 05/03/2019   PLT 521 (H) 05/03/2019      Chemistry      Component Value Date/Time   NA 144 04/27/2019 1111   NA 137 05/12/2017 1142   K 3.6 04/27/2019 1111   CL 100 04/27/2019 1111   CO2 34 (H) 04/27/2019 1111   BUN 18 04/27/2019 1111   BUN 18 05/12/2017 1142   CREATININE 1.07 (H) 04/27/2019 1111   CREATININE 0.66 06/19/2018 1554      Component Value Date/Time   CALCIUM 9.6 04/27/2019 1111   ALKPHOS 192 (H) 04/27/2019 1111   AST 27 04/27/2019 1111   ALT 24 04/27/2019 1111   BILITOT <0.2 (L) 04/27/2019 1111       RADIOGRAPHIC STUDIES: Dg Chest 2 View  Result Date: 04/18/2019 CLINICAL DATA:  Syncope, falls EXAM: CHEST - 2 VIEW COMPARISON:  04/12/2019 FINDINGS: Cardiomegaly. Unchanged elevation of the right hemidiaphragm with a possible small right pleural effusion. Disc degenerative disease of the thoracic spine. IMPRESSION: Cardiomegaly. Unchanged elevation of the right hemidiaphragm with a possible small right pleural effusion. Electronically Signed   By: Eddie Candle M.D.   On: 04/18/2019 16:20   Dg Chest 2 View  Result Date: 04/12/2019 CLINICAL DATA:  Post right VATS 02/04/2019.  Small cell lung cancer. EXAM: CHEST - 2 VIEW COMPARISON:  01/04/2018 FINDINGS: Postoperative changes on the right with volume loss. No confluent airspace opacities or significant effusions. Heart is normal size. No acute bony abnormality. IMPRESSION: Postoperative changes on the right with volume loss. No active disease. Electronically Signed   By: Rolm Baptise M.D.   On: 04/12/2019 12:52   Ct Head Wo Contrast  Result Date: 04/18/2019 CLINICAL DATA:  Multiple falls EXAM: CT HEAD WITHOUT CONTRAST CT  CERVICAL SPINE WITHOUT CONTRAST TECHNIQUE: Multidetector CT imaging of the head and cervical spine was performed following the standard protocol without intravenous contrast. Multiplanar CT image reconstructions of the cervical spine were also generated. COMPARISON:  MR brain, 04/05/2019 FINDINGS: CT HEAD FINDINGS Brain: No evidence of acute infarction, hemorrhage, hydrocephalus, extra-axial collection or mass lesion/mass effect. Vascular: No hyperdense vessel or unexpected calcification. Skull: Normal. Negative for fracture or  focal lesion. Sinuses/Orbits: No acute finding. Other: None. CT CERVICAL SPINE FINDINGS Alignment: Normal. Skull base and vertebrae: No acute fracture. No primary bone lesion or focal pathologic process. Soft tissues and spinal canal: No prevertebral fluid or swelling. No visible canal hematoma. Disc levels: Mild multilevel disc space height loss and osteophytosis. Upper chest: Negative. Other: None. IMPRESSION: 1.  No acute intracranial pathology. 2.  No fracture or static subluxation of the cervical spine. Electronically Signed   By: Eddie Candle M.D.   On: 04/18/2019 17:21   Ct Cervical Spine Wo Contrast  Result Date: 04/18/2019 CLINICAL DATA:  Multiple falls EXAM: CT HEAD WITHOUT CONTRAST CT CERVICAL SPINE WITHOUT CONTRAST TECHNIQUE: Multidetector CT imaging of the head and cervical spine was performed following the standard protocol without intravenous contrast. Multiplanar CT image reconstructions of the cervical spine were also generated. COMPARISON:  MR brain, 04/05/2019 FINDINGS: CT HEAD FINDINGS Brain: No evidence of acute infarction, hemorrhage, hydrocephalus, extra-axial collection or mass lesion/mass effect. Vascular: No hyperdense vessel or unexpected calcification. Skull: Normal. Negative for fracture or focal lesion. Sinuses/Orbits: No acute finding. Other: None. CT CERVICAL SPINE FINDINGS Alignment: Normal. Skull base and vertebrae: No acute fracture. No primary bone  lesion or focal pathologic process. Soft tissues and spinal canal: No prevertebral fluid or swelling. No visible canal hematoma. Disc levels: Mild multilevel disc space height loss and osteophytosis. Upper chest: Negative. Other: None. IMPRESSION: 1.  No acute intracranial pathology. 2.  No fracture or static subluxation of the cervical spine. Electronically Signed   By: Eddie Candle M.D.   On: 04/18/2019 17:21   Mr Jeri Cos ZO Contrast  Result Date: 04/05/2019 CLINICAL DATA:  Recent diagnosis of small cell lung cancer with right upper lobectomy. Staging. EXAM: MRI HEAD WITHOUT AND WITH CONTRAST TECHNIQUE: Multiplanar, multiecho pulse sequences of the brain and surrounding structures were obtained without and with intravenous contrast. CONTRAST:  71mL MULTIHANCE GADOBENATE DIMEGLUMINE 529 MG/ML IV SOLN COMPARISON:  None. FINDINGS: Brain: Diffusion imaging does not show any acute or subacute infarction. No evidence of metastatic disease. Minimal small vessel change of the cerebral hemispheric white matter, less than often seen at this age. No cortical or large vessel territory insult. No hemorrhage, hydrocephalus or extra-axial collection. No abnormal contrast enhancement. Vascular: Major vessels at the base of the brain show flow. Skull and upper cervical spine: Negative Sinuses/Orbits: Clear/normal Other: None IMPRESSION: No evidence of metastatic disease. No acute or significant finding. Minimal small vessel change of the white matter, less than often seen at this age. Electronically Signed   By: Nelson Chimes M.D.   On: 04/05/2019 12:36    ASSESSMENT AND PLAN: This is a very pleasant 68 years old white female with limited stage, stage Ia small cell lung cancer right upper and middle bilobectomy. The patient is currently undergoing adjuvant systemic chemotherapy with cisplatin and etoposide status post 1 cycle. The patient tolerated the first cycle of her treatment well except for delayed nausea.  She was  given more antiemetic with Zofran and Decadron. I recommended for her to proceed with cycle #2 today as planned. I will see her back for follow-up visit in 3 weeks for evaluation before starting cycle #3. The patient was advised to call immediately if she has any concerning symptoms in the interval..   The patient voices understanding of current disease status and treatment options and is in agreement with the current care plan.  All questions were answered. The patient knows to call the clinic with  any problems, questions or concerns. We can certainly see the patient much sooner if necessary.  I spent 10 minutes counseling the patient face to face. The total time spent in the appointment was 15 minutes.  Disclaimer: This note was dictated with voice recognition software. Similar sounding words can inadvertently be transcribed and may not be corrected upon review.

## 2019-05-04 ENCOUNTER — Other Ambulatory Visit: Payer: Self-pay

## 2019-05-04 ENCOUNTER — Inpatient Hospital Stay: Payer: Medicare HMO

## 2019-05-04 VITALS — BP 103/54 | HR 77 | Temp 97.7°F | Resp 20

## 2019-05-04 DIAGNOSIS — Z5189 Encounter for other specified aftercare: Secondary | ICD-10-CM | POA: Diagnosis not present

## 2019-05-04 DIAGNOSIS — R635 Abnormal weight gain: Secondary | ICD-10-CM | POA: Diagnosis not present

## 2019-05-04 DIAGNOSIS — I4892 Unspecified atrial flutter: Secondary | ICD-10-CM | POA: Diagnosis not present

## 2019-05-04 DIAGNOSIS — B370 Candidal stomatitis: Secondary | ICD-10-CM | POA: Diagnosis not present

## 2019-05-04 DIAGNOSIS — B3781 Candidal esophagitis: Secondary | ICD-10-CM | POA: Diagnosis not present

## 2019-05-04 DIAGNOSIS — C3491 Malignant neoplasm of unspecified part of right bronchus or lung: Secondary | ICD-10-CM

## 2019-05-04 DIAGNOSIS — C3411 Malignant neoplasm of upper lobe, right bronchus or lung: Secondary | ICD-10-CM | POA: Diagnosis not present

## 2019-05-04 DIAGNOSIS — R110 Nausea: Secondary | ICD-10-CM | POA: Diagnosis not present

## 2019-05-04 DIAGNOSIS — Z5111 Encounter for antineoplastic chemotherapy: Secondary | ICD-10-CM | POA: Diagnosis not present

## 2019-05-04 DIAGNOSIS — Z720 Tobacco use: Secondary | ICD-10-CM | POA: Diagnosis not present

## 2019-05-04 MED ORDER — SODIUM CHLORIDE 0.9 % IV SOLN
Freq: Once | INTRAVENOUS | Status: AC
  Administered 2019-05-04: 14:00:00 via INTRAVENOUS
  Filled 2019-05-04: qty 250

## 2019-05-04 MED ORDER — DEXAMETHASONE SODIUM PHOSPHATE 10 MG/ML IJ SOLN
INTRAMUSCULAR | Status: AC
  Filled 2019-05-04: qty 1

## 2019-05-04 MED ORDER — DEXAMETHASONE SODIUM PHOSPHATE 10 MG/ML IJ SOLN
10.0000 mg | Freq: Once | INTRAMUSCULAR | Status: AC
  Administered 2019-05-04: 10 mg via INTRAVENOUS

## 2019-05-04 MED ORDER — SODIUM CHLORIDE 0.9 % IV SOLN
100.0000 mg/m2 | Freq: Once | INTRAVENOUS | Status: AC
  Administered 2019-05-04: 170 mg via INTRAVENOUS
  Filled 2019-05-04: qty 8.5

## 2019-05-04 NOTE — Patient Instructions (Signed)
[facility] Discharge Instructions for Patients Receiving Chemotherapy  Today you received the following chemotherapy agents: etoposide  To help prevent nausea and vomiting after your treatment, we encourage you to take your nausea medication as directed.   If you develop nausea and vomiting that is not controlled by your nausea medication, call the clinic.   BELOW ARE SYMPTOMS THAT SHOULD BE REPORTED IMMEDIATELY:  *FEVER GREATER THAN 100.5 F  *CHILLS WITH OR WITHOUT FEVER  NAUSEA AND VOMITING THAT IS NOT CONTROLLED WITH YOUR NAUSEA MEDICATION  *UNUSUAL SHORTNESS OF BREATH  *UNUSUAL BRUISING OR BLEEDING  TENDERNESS IN MOUTH AND THROAT WITH OR WITHOUT PRESENCE OF ULCERS  *URINARY PROBLEMS  *BOWEL PROBLEMS  UNUSUAL RASH Items with * indicate a potential emergency and should be followed up as soon as possible.  Feel free to call the clinic should you have any questions or concerns. The clinic phone number is [phone].  Please show the CHEMO ALERT CARD at check-in to the Emergency Department and triage nurse.   

## 2019-05-05 ENCOUNTER — Other Ambulatory Visit: Payer: Self-pay

## 2019-05-05 ENCOUNTER — Inpatient Hospital Stay: Payer: Medicare HMO

## 2019-05-05 VITALS — BP 109/62 | HR 79 | Temp 98.9°F | Resp 20

## 2019-05-05 DIAGNOSIS — I4892 Unspecified atrial flutter: Secondary | ICD-10-CM | POA: Diagnosis not present

## 2019-05-05 DIAGNOSIS — R110 Nausea: Secondary | ICD-10-CM | POA: Diagnosis not present

## 2019-05-05 DIAGNOSIS — C3411 Malignant neoplasm of upper lobe, right bronchus or lung: Secondary | ICD-10-CM | POA: Diagnosis not present

## 2019-05-05 DIAGNOSIS — B370 Candidal stomatitis: Secondary | ICD-10-CM | POA: Diagnosis not present

## 2019-05-05 DIAGNOSIS — C3491 Malignant neoplasm of unspecified part of right bronchus or lung: Secondary | ICD-10-CM

## 2019-05-05 DIAGNOSIS — Z5111 Encounter for antineoplastic chemotherapy: Secondary | ICD-10-CM | POA: Diagnosis not present

## 2019-05-05 DIAGNOSIS — Z720 Tobacco use: Secondary | ICD-10-CM | POA: Diagnosis not present

## 2019-05-05 DIAGNOSIS — R635 Abnormal weight gain: Secondary | ICD-10-CM | POA: Diagnosis not present

## 2019-05-05 DIAGNOSIS — B3781 Candidal esophagitis: Secondary | ICD-10-CM | POA: Diagnosis not present

## 2019-05-05 DIAGNOSIS — Z5189 Encounter for other specified aftercare: Secondary | ICD-10-CM | POA: Diagnosis not present

## 2019-05-05 MED ORDER — SODIUM CHLORIDE 0.9 % IV SOLN
Freq: Once | INTRAVENOUS | Status: AC
  Administered 2019-05-05: 14:00:00 via INTRAVENOUS
  Filled 2019-05-05: qty 250

## 2019-05-05 MED ORDER — DEXAMETHASONE SODIUM PHOSPHATE 10 MG/ML IJ SOLN
INTRAMUSCULAR | Status: AC
  Filled 2019-05-05: qty 1

## 2019-05-05 MED ORDER — SODIUM CHLORIDE 0.9 % IV SOLN
100.0000 mg/m2 | Freq: Once | INTRAVENOUS | Status: AC
  Administered 2019-05-05: 170 mg via INTRAVENOUS
  Filled 2019-05-05: qty 8.5

## 2019-05-05 MED ORDER — DEXAMETHASONE SODIUM PHOSPHATE 10 MG/ML IJ SOLN
10.0000 mg | Freq: Once | INTRAMUSCULAR | Status: AC
  Administered 2019-05-05: 10 mg via INTRAVENOUS

## 2019-05-05 NOTE — Patient Instructions (Signed)
[facility] Discharge Instructions for Patients Receiving Chemotherapy  Today you received the following chemotherapy agents: etoposide  To help prevent nausea and vomiting after your treatment, we encourage you to take your nausea medication as directed.   If you develop nausea and vomiting that is not controlled by your nausea medication, call the clinic.   BELOW ARE SYMPTOMS THAT SHOULD BE REPORTED IMMEDIATELY:  *FEVER GREATER THAN 100.5 F  *CHILLS WITH OR WITHOUT FEVER  NAUSEA AND VOMITING THAT IS NOT CONTROLLED WITH YOUR NAUSEA MEDICATION  *UNUSUAL SHORTNESS OF BREATH  *UNUSUAL BRUISING OR BLEEDING  TENDERNESS IN MOUTH AND THROAT WITH OR WITHOUT PRESENCE OF ULCERS  *URINARY PROBLEMS  *BOWEL PROBLEMS  UNUSUAL RASH Items with * indicate a potential emergency and should be followed up as soon as possible.  Feel free to call the clinic should you have any questions or concerns. The clinic phone number is [phone].  Please show the CHEMO ALERT CARD at check-in to the Emergency Department and triage nurse.   

## 2019-05-07 ENCOUNTER — Other Ambulatory Visit: Payer: Self-pay

## 2019-05-07 ENCOUNTER — Inpatient Hospital Stay: Payer: Medicare HMO

## 2019-05-07 VITALS — BP 108/62 | HR 78 | Temp 98.9°F | Resp 17

## 2019-05-07 DIAGNOSIS — R110 Nausea: Secondary | ICD-10-CM | POA: Diagnosis not present

## 2019-05-07 DIAGNOSIS — C3411 Malignant neoplasm of upper lobe, right bronchus or lung: Secondary | ICD-10-CM | POA: Diagnosis not present

## 2019-05-07 DIAGNOSIS — Z5111 Encounter for antineoplastic chemotherapy: Secondary | ICD-10-CM | POA: Diagnosis not present

## 2019-05-07 DIAGNOSIS — C3491 Malignant neoplasm of unspecified part of right bronchus or lung: Secondary | ICD-10-CM

## 2019-05-07 DIAGNOSIS — Z5189 Encounter for other specified aftercare: Secondary | ICD-10-CM | POA: Diagnosis not present

## 2019-05-07 DIAGNOSIS — Z720 Tobacco use: Secondary | ICD-10-CM | POA: Diagnosis not present

## 2019-05-07 DIAGNOSIS — I4892 Unspecified atrial flutter: Secondary | ICD-10-CM | POA: Diagnosis not present

## 2019-05-07 DIAGNOSIS — B3781 Candidal esophagitis: Secondary | ICD-10-CM | POA: Diagnosis not present

## 2019-05-07 DIAGNOSIS — R635 Abnormal weight gain: Secondary | ICD-10-CM | POA: Diagnosis not present

## 2019-05-07 DIAGNOSIS — B370 Candidal stomatitis: Secondary | ICD-10-CM | POA: Diagnosis not present

## 2019-05-07 MED ORDER — PEGFILGRASTIM-CBQV 6 MG/0.6ML ~~LOC~~ SOSY
PREFILLED_SYRINGE | SUBCUTANEOUS | Status: AC
  Filled 2019-05-07: qty 0.6

## 2019-05-07 MED ORDER — PEGFILGRASTIM-CBQV 6 MG/0.6ML ~~LOC~~ SOSY
6.0000 mg | PREFILLED_SYRINGE | Freq: Once | SUBCUTANEOUS | Status: AC
  Administered 2019-05-07: 6 mg via SUBCUTANEOUS

## 2019-05-07 NOTE — Patient Instructions (Signed)
Pegfilgrastim injection  What is this medicine?  PEGFILGRASTIM (PEG fil gra stim) is a long-acting granulocyte colony-stimulating factor that stimulates the growth of neutrophils, a type of white blood cell important in the body's fight against infection. It is used to reduce the incidence of fever and infection in patients with certain types of cancer who are receiving chemotherapy that affects the bone marrow, and to increase survival after being exposed to high doses of radiation.  This medicine may be used for other purposes; ask your health care provider or pharmacist if you have questions.  COMMON BRAND NAME(S): Fulphila, Neulasta, UDENYCA  What should I tell my health care provider before I take this medicine?  They need to know if you have any of these conditions:  -kidney disease  -latex allergy  -ongoing radiation therapy  -sickle cell disease  -skin reactions to acrylic adhesives (On-Body Injector only)  -an unusual or allergic reaction to pegfilgrastim, filgrastim, other medicines, foods, dyes, or preservatives  -pregnant or trying to get pregnant  -breast-feeding  How should I use this medicine?  This medicine is for injection under the skin. If you get this medicine at home, you will be taught how to prepare and give the pre-filled syringe or how to use the On-body Injector. Refer to the patient Instructions for Use for detailed instructions. Use exactly as directed. Tell your healthcare provider immediately if you suspect that the On-body Injector may not have performed as intended or if you suspect the use of the On-body Injector resulted in a missed or partial dose.  It is important that you put your used needles and syringes in a special sharps container. Do not put them in a trash can. If you do not have a sharps container, call your pharmacist or healthcare provider to get one.  Talk to your pediatrician regarding the use of this medicine in children. While this drug may be prescribed for  selected conditions, precautions do apply.  Overdosage: If you think you have taken too much of this medicine contact a poison control center or emergency room at once.  NOTE: This medicine is only for you. Do not share this medicine with others.  What if I miss a dose?  It is important not to miss your dose. Call your doctor or health care professional if you miss your dose. If you miss a dose due to an On-body Injector failure or leakage, a new dose should be administered as soon as possible using a single prefilled syringe for manual use.  What may interact with this medicine?  Interactions have not been studied.  Give your health care provider a list of all the medicines, herbs, non-prescription drugs, or dietary supplements you use. Also tell them if you smoke, drink alcohol, or use illegal drugs. Some items may interact with your medicine.  This list may not describe all possible interactions. Give your health care provider a list of all the medicines, herbs, non-prescription drugs, or dietary supplements you use. Also tell them if you smoke, drink alcohol, or use illegal drugs. Some items may interact with your medicine.  What should I watch for while using this medicine?  You may need blood work done while you are taking this medicine.  If you are going to need a MRI, CT scan, or other procedure, tell your doctor that you are using this medicine (On-Body Injector only).  What side effects may I notice from receiving this medicine?  Side effects that you should report to   your doctor or health care professional as soon as possible:  -allergic reactions like skin rash, itching or hives, swelling of the face, lips, or tongue  -back pain  -dizziness  -fever  -pain, redness, or irritation at site where injected  -pinpoint red spots on the skin  -red or dark-brown urine  -shortness of breath or breathing problems  -stomach or side pain, or pain at the shoulder  -swelling  -tiredness  -trouble passing urine or  change in the amount of urine  Side effects that usually do not require medical attention (report to your doctor or health care professional if they continue or are bothersome):  -bone pain  -muscle pain  This list may not describe all possible side effects. Call your doctor for medical advice about side effects. You may report side effects to FDA at 1-800-FDA-1088.  Where should I keep my medicine?  Keep out of the reach of children.  If you are using this medicine at home, you will be instructed on how to store it. Throw away any unused medicine after the expiration date on the label.  NOTE: This sheet is a summary. It may not cover all possible information. If you have questions about this medicine, talk to your doctor, pharmacist, or health care provider.   2019 Elsevier/Gold Standard (2018-02-23 16:57:08)

## 2019-05-08 DIAGNOSIS — J449 Chronic obstructive pulmonary disease, unspecified: Secondary | ICD-10-CM | POA: Diagnosis not present

## 2019-05-10 ENCOUNTER — Telehealth: Payer: Self-pay | Admitting: Medical Oncology

## 2019-05-10 ENCOUNTER — Other Ambulatory Visit: Payer: Self-pay | Admitting: Internal Medicine

## 2019-05-10 ENCOUNTER — Inpatient Hospital Stay: Payer: Medicare HMO

## 2019-05-10 ENCOUNTER — Inpatient Hospital Stay (HOSPITAL_BASED_OUTPATIENT_CLINIC_OR_DEPARTMENT_OTHER): Payer: Medicare HMO | Admitting: Medical

## 2019-05-10 ENCOUNTER — Other Ambulatory Visit: Payer: Self-pay | Admitting: Family Medicine

## 2019-05-10 ENCOUNTER — Other Ambulatory Visit: Payer: Self-pay

## 2019-05-10 VITALS — BP 134/72 | HR 62 | Temp 98.9°F | Resp 18 | Ht 62.0 in | Wt 129.8 lb

## 2019-05-10 DIAGNOSIS — C3491 Malignant neoplasm of unspecified part of right bronchus or lung: Secondary | ICD-10-CM

## 2019-05-10 DIAGNOSIS — Z5189 Encounter for other specified aftercare: Secondary | ICD-10-CM | POA: Diagnosis not present

## 2019-05-10 DIAGNOSIS — B370 Candidal stomatitis: Secondary | ICD-10-CM

## 2019-05-10 DIAGNOSIS — R110 Nausea: Secondary | ICD-10-CM

## 2019-05-10 DIAGNOSIS — Z5111 Encounter for antineoplastic chemotherapy: Secondary | ICD-10-CM | POA: Diagnosis not present

## 2019-05-10 DIAGNOSIS — B3781 Candidal esophagitis: Secondary | ICD-10-CM

## 2019-05-10 DIAGNOSIS — Z720 Tobacco use: Secondary | ICD-10-CM | POA: Diagnosis not present

## 2019-05-10 DIAGNOSIS — I4892 Unspecified atrial flutter: Secondary | ICD-10-CM | POA: Diagnosis not present

## 2019-05-10 DIAGNOSIS — R1310 Dysphagia, unspecified: Secondary | ICD-10-CM | POA: Diagnosis not present

## 2019-05-10 DIAGNOSIS — R635 Abnormal weight gain: Secondary | ICD-10-CM | POA: Diagnosis not present

## 2019-05-10 DIAGNOSIS — C3411 Malignant neoplasm of upper lobe, right bronchus or lung: Secondary | ICD-10-CM | POA: Diagnosis not present

## 2019-05-10 LAB — CMP (CANCER CENTER ONLY)
ALT: 19 U/L (ref 0–44)
AST: 16 U/L (ref 15–41)
Albumin: 3.7 g/dL (ref 3.5–5.0)
Alkaline Phosphatase: 182 U/L — ABNORMAL HIGH (ref 38–126)
Anion gap: 13 (ref 5–15)
BUN: 31 mg/dL — ABNORMAL HIGH (ref 8–23)
CO2: 34 mmol/L — ABNORMAL HIGH (ref 22–32)
Calcium: 9.9 mg/dL (ref 8.9–10.3)
Chloride: 92 mmol/L — ABNORMAL LOW (ref 98–111)
Creatinine: 1.03 mg/dL — ABNORMAL HIGH (ref 0.44–1.00)
GFR, Est AFR Am: >60 mL/min (ref >60)
GFR, Estimated: 55 mL/min — ABNORMAL LOW (ref >60)
Glucose, Bld: 135 mg/dL — ABNORMAL HIGH (ref 70–99)
Potassium: 3.5 mmol/L (ref 3.5–5.1)
Sodium: 139 mmol/L (ref 135–145)
Total Bilirubin: 0.5 mg/dL (ref 0.3–1.2)
Total Protein: 6.8 g/dL (ref 6.5–8.1)

## 2019-05-10 LAB — CBC WITH DIFFERENTIAL (CANCER CENTER ONLY)
Abs Immature Granulocytes: 0 10*3/uL (ref 0.00–0.07)
Basophils Absolute: 0 10*3/uL (ref 0.0–0.1)
Basophils Relative: 0 %
Eosinophils Absolute: 0 10*3/uL (ref 0.0–0.5)
Eosinophils Relative: 0 %
HCT: 38.8 % (ref 36.0–46.0)
Hemoglobin: 12.6 g/dL (ref 12.0–15.0)
Lymphocytes Relative: 6 %
Lymphs Abs: 1.8 10*3/uL (ref 0.7–4.0)
MCH: 28.1 pg (ref 26.0–34.0)
MCHC: 32.5 g/dL (ref 30.0–36.0)
MCV: 86.6 fL (ref 80.0–100.0)
Monocytes Absolute: 0 10*3/uL — ABNORMAL LOW (ref 0.1–1.0)
Monocytes Relative: 0 %
Neutro Abs: 28.5 10*3/uL — ABNORMAL HIGH (ref 1.7–17.7)
Neutrophils Relative %: 94 %
Platelet Count: 248 10*3/uL (ref 150–400)
RBC: 4.48 MIL/uL (ref 3.87–5.11)
RDW: 14.7 % (ref 11.5–15.5)
WBC Count: 30.3 10*3/uL — ABNORMAL HIGH (ref 4.0–10.5)
nRBC: 0 % (ref 0.0–0.2)

## 2019-05-10 LAB — MAGNESIUM: Magnesium: 2 mg/dL (ref 1.7–2.4)

## 2019-05-10 MED ORDER — FLUCONAZOLE 100 MG PO TABS: 100.0000 mg | ORAL_TABLET | Freq: Every day | ORAL | 0 refills | Status: DC

## 2019-05-10 NOTE — Progress Notes (Signed)
Pt states "nothing tastes good to me and my stomach is in knots (nauseated), and my tongue is white".  Denies V/D/C or changes in bowels/bladder.  Tongue has white thick substance.  Denies pain in throat or ears or sinuses.

## 2019-05-10 NOTE — Telephone Encounter (Signed)
Per Dr Julien Nordmann pt can see Sandi Mealy, [facility].

## 2019-05-10 NOTE — Progress Notes (Signed)
[facility] Progress Note   Tonya Bradley [id number] 1951-07-16 68 y.o.  Kym Groom is managed by Dr. Fanny Bien. Mohamed  Actively treated with chemotherapy/immunotherapy/hormonal therapy: yes  Current therapy: Cisplatin and etoposide  Last treated: 05/03/2019 (cycle 2)  Next scheduled appointment with provider: 05/26/2019  Assessment: Plan:    Small cell lung cancer, right ([place])  Thrush of mouth and esophagus ([place])   Small cell lung cancer: The patient continues to be followed by Dr. Julien Nordmann and is status post cycle 2 of cisplatin and etoposide which began on 05/03/2019.  Thrush: The patient was given a prescription for Diflucan 100 mg p.o. once daily x10 days.  Please see After Visit Summary for patient specific instructions.  Future Appointments  Date Time Provider [place]  05/11/2019  3:15 PM Melrose Nakayama, MD [facility]  05/17/2019 11:15 AM [facility] LAB 6 [facility] None  05/26/2019  8:00 AM [facility] LAB ONLY [facility] None  05/26/2019  8:30 AM Heilingoetter, Cassandra L, PA-C [facility] None  05/26/2019  9:00 AM [facility] INFUSION [facility] None  05/27/2019 11:20 AM Evans Lance, MD [facility]  05/27/2019  2:30 PM [facility] INFUSION [facility] None  05/28/2019  8:00 AM [facility] INFUSION [facility] None  05/29/2019  2:00 PM [facility] FLUSH [facility] None  06/01/2019  1:30 PM [facility] LAB 1 [facility] None  06/08/2019 11:45 AM [facility] LAB 1 [facility] None  06/14/2019  8:15 AM [facility] LAB 2 [facility] None  06/14/2019  8:45 AM Curt Bears, MD [facility] None  06/14/2019  9:30 AM [facility] INFUSION [facility] None  06/15/2019  1:00 PM [facility] INFUSION [facility] None  06/16/2019  1:30 PM [facility] INFUSION [facility] None    No orders of the defined types were placed in this encounter.      Subjective:   Patient ID:  Tonya Bradley is a 68 y.o. (DOB [birth year])  female.  Chief Complaint:  Chief Complaint  Patient presents with  . Thrush    HPI Tonya Bradley is a 68 year old female with a history of a small cell carcinoma of the lung who is managed by Dr. Julien Nordmann and is status post cycle 2 of cisplatin and etoposide which was begun on 05/03/2019.  She presents to the clinic today with nausea and stomach upset.  She also states that she has not been eating due to the bad taste that is in her mouth.  She reports that she has had quite plaquing over her tongue and oropharynx.  She reports that it is painful to swallow.  She denies vomiting, fevers, chills, or sweats.  Medications: I have reviewed the patient's current medications.  Allergies:  Allergies  Allergen Reactions  . Morphine And Related Other (See Comments)    hallucinations    Past Medical History:  Diagnosis Date  . Anemia   . Anxiety   . Arthritis    "back"  . Atrial flutter ([place]) 03/13/2016  . CAD (coronary artery disease)   . Cancer ([place])    skin cancer  . Cataract   . Colon polyp   . Complication of anesthesia    BP dropped during esophagus diltation in 2017-admitted for 3 days.   Marland Kitchen COPD (chronic obstructive pulmonary disease) ([place])   . Depression   . Dysrhythmia   . Emphysema of lung ([place])   . GERD (gastroesophageal reflux disease)   . Hypercholesterolemia   . Lung cancer ([place])   . Osteoporosis   . Oxygen deficiency  Past Surgical History:  Procedure Laterality Date  . A-FLUTTER ABLATION N/A 05/19/2017   Procedure: A-Flutter Ablation;  Surgeon: Evans Lance, MD;  Location: [facility];  Service: Cardiovascular;  Laterality: N/A;  . APPENDECTOMY    . BASAL CELL CARCINOMA EXCISION    . CARDIAC CATHETERIZATION    . CATARACT EXTRACTION Bilateral   . CHOLECYSTECTOMY    . COLONOSCOPY N/A 06/23/2014   Dr. Rourk:Rectal and colonic polyps-removed, Pancolonic diverticulosis. lymphocytic colitis  . CORONARY ANGIOPLASTY WITH STENT PLACEMENT    . ESOPHAGEAL  DILATION N/A 03/13/2016   Procedure: ESOPHAGEAL DILATION;  Surgeon: Daneil Dolin, MD;  [facility];  Service: Endoscopy;  Laterality: N/A;  . ESOPHAGOGASTRODUODENOSCOPY N/A 03/13/2016   Procedure: ESOPHAGOGASTRODUODENOSCOPY (EGD);  Surgeon: Daneil Dolin, MD;  [facility];  Service: Endoscopy;  Laterality: N/A;  0930  . EYE SURGERY     cataract removed, bilateral  . IR THORACENTESIS ASP PLEURAL SPACE W/IMG GUIDE  02/15/2019  . LUMBAR LAMINECTOMY/DECOMPRESSION MICRODISCECTOMY N/A 01/19/2018   Procedure: L4-5 DECOMPRESSION;  Surgeon: Marybelle Killings, MD;  Location: [facility];  Service: Orthopedics;  Laterality: N/A;  . VIDEO ASSISTED THORACOSCOPY (VATS)/ LOBECTOMY Right 02/04/2019   Procedure: VIDEO ASSISTED THORACOSCOPY (VATS)/RIGHT UPPER AND MIDDLE LOBECTOMY;  Surgeon: Melrose Nakayama, MD;  Location: [facility];  Service: Thoracic;  Laterality: Right;  Marland Kitchen VIDEO BRONCHOSCOPY N/A 02/14/2019   Procedure: BRONCHOSCOPY WITH SEDATION;  Surgeon: Melrose Nakayama, MD;  Location: [facility];  Service: Thoracic;  Laterality: N/A;    Family History  Problem Relation Age of Onset  . Breast cancer Sister 54  . Heart disease Sister   . Heart attack Father   . Heart disease Father   . Stroke Mother   . Heart disease Mother   . Hyperlipidemia Mother   . Heart attack Sister   . Heart disease Brother        ?heart failure  . Congestive Heart Failure Brother   . Hypertension Son   . Heart disease Maternal Grandfather   . Cancer Cousin        lymphoma  . Hypertension Son   . Stomach cancer Other   . Colon cancer Neg Hx   . Pancreatic cancer Neg Hx     Social History   Socioeconomic History  . Marital status: Married    Spouse name: Not on file  . Number of children: 3  . Years of education: 10  . Highest education level: Not on file  Occupational History  . Occupation: retired    Comment: customer service [employer]  . Financial resource strain: Not on file  .  Food insecurity:    Worry: Not on file    Inability: Not on file  . Transportation needs:    Medical: Not on file    Non-medical: Not on file  Tobacco Use  . Smoking status: Current Every Day Smoker    Packs/day: 0.25    Years: 54.00    Pack years: 13.50    Types: Cigarettes    Start date: 06/14/1966  . Smokeless tobacco: Never Used  . Tobacco comment: 1-2 packs daily. Chantix too expensive; pt states that she quit 2 weeks ago (02/04/19)   Substance and Sexual Activity  . Alcohol use: No    Alcohol/week: 0.0 standard drinks  . Drug use: No  . Sexual activity: Yes    Birth control/protection: Post-menopausal  Lifestyle  . Physical activity:  Days per week: Not on file    Minutes per session: Not on file  . Stress: Not on file  Relationships  . Social connections:    Talks on phone: Not on file    Gets together: Not on file    Attends religious service: Not on file    Active member of club or organization: Not on file    Attends meetings of clubs or organizations: Not on file    Relationship status: Not on file  . Intimate partner violence:    Fear of current or ex partner: Not on file    Emotionally abused: Not on file    Physically abused: Not on file    Forced sexual activity: Not on file  Other Topics Concern  . Not on file  Social History Narrative   Quit high school and got married in 10th grade, age 52   First husband died of colon cancer at young age   Married again 2018   Lives in own home   Daughter lives with her   Drinks coffee seldom, too much "sweet tea"    Past Medical History, Surgical history, Social history, and Family history were reviewed and updated as appropriate.   Please see review of systems for further details on the patient's review from today.   Review of Systems:  Review of Systems  Constitutional: Positive for appetite change. Negative for activity change, chills, diaphoresis, fatigue, fever and unexpected weight change.  HENT:  Positive for trouble swallowing.   Respiratory: Negative for cough, choking and shortness of breath.   Cardiovascular: Negative for chest pain.  Gastrointestinal: Positive for nausea. Negative for vomiting.    Objective:   Physical Exam:  BP 134/72 (BP Location: Right Arm, Patient Position: Sitting)   Pulse 62   Temp 98.9 F (37.2 C) (Oral)   Resp 18   Ht 5\' 2"  (1.575 m)   Wt 129 lb 12.8 oz (58.9 kg)   SpO2 93%   BMI 23.74 kg/m  ECOG: 1  Physical Exam Constitutional:      General: She is not in acute distress.    Appearance: She is not toxic-appearing or diaphoretic.  HENT:     Head: Normocephalic and atraumatic.     Mouth/Throat:     Mouth: Mucous membranes are moist.     Comments: Extensive whitish plaquing of the tongue and posterior pharynx is noted. Eyes:     General: No scleral icterus.       Right eye: No discharge.        Left eye: No discharge.  Cardiovascular:     Rate and Rhythm: Normal rate and regular rhythm.     Heart sounds: Normal heart sounds. No murmur. No friction rub. No gallop.   Pulmonary:     Effort: Pulmonary effort is normal. No respiratory distress.     Breath sounds: Wheezing present. No rales.     Comments: Diffuse respiratory inspiratory wheezes are noted in both lung fields. Skin:    General: Skin is warm and dry.  Neurological:     Mental Status: She is alert.     Gait: Gait normal.  Psychiatric:        Mood and Affect: Mood normal.        Behavior: Behavior normal.        Thought Content: Thought content normal.        Judgment: Judgment normal.     Lab Review:     Component Value Date/Time  NA 139 05/10/2019 1143   NA 137 05/12/2017 1142   K 3.5 05/10/2019 1143   CL 92 (L) 05/10/2019 1143   CO2 34 (H) 05/10/2019 1143   GLUCOSE 135 (H) 05/10/2019 1143   BUN 31 (H) 05/10/2019 1143   BUN 18 05/12/2017 1142   CREATININE 1.03 (H) 05/10/2019 1143   CREATININE 0.66 06/19/2018 1554   CALCIUM 9.9 05/10/2019 1143   PROT 6.8  05/10/2019 1143   ALBUMIN 3.7 05/10/2019 1143   AST 16 05/10/2019 1143   ALT 19 05/10/2019 1143   ALKPHOS 182 (H) 05/10/2019 1143   BILITOT 0.5 05/10/2019 1143   GFRNONAA 55 (L) 05/10/2019 1143   GFRNONAA 67 04/18/2017 1441   GFRAA >60 05/10/2019 1143   GFRAA 78 04/18/2017 1441       Component Value Date/Time   WBC 30.3 (H) 05/10/2019 1143   WBC 33.2 (H) 04/18/2019 1531   RBC 4.48 05/10/2019 1143   HGB 12.6 05/10/2019 1143   HGB 12.6 05/12/2017 1142   HCT 38.8 05/10/2019 1143   HCT 36.6 05/12/2017 1142   PLT 248 05/10/2019 1143   PLT 335 05/12/2017 1142   MCV 86.6 05/10/2019 1143   MCV 87 05/12/2017 1142   MCH 28.1 05/10/2019 1143   MCHC 32.5 05/10/2019 1143   RDW 14.7 05/10/2019 1143   RDW 14.0 05/12/2017 1142   LYMPHSABS 1.8 05/10/2019 1143   LYMPHSABS 2.0 05/12/2017 1142   MONOABS 0.0 (L) 05/10/2019 1143   EOSABS 0.0 05/10/2019 1143   EOSABS 0.0 05/12/2017 1142   BASOSABS 0.0 05/10/2019 1143   BASOSABS 0.0 05/12/2017 1142   -------------------------------  Imaging from last 24 hours (if applicable):  Radiology interpretation: Dg Chest 2 View  Result Date: 04/18/2019 CLINICAL DATA:  Syncope, falls EXAM: CHEST - 2 VIEW COMPARISON:  04/12/2019 FINDINGS: Cardiomegaly. Unchanged elevation of the right hemidiaphragm with a possible small right pleural effusion. Disc degenerative disease of the thoracic spine. IMPRESSION: Cardiomegaly. Unchanged elevation of the right hemidiaphragm with a possible small right pleural effusion. Electronically Signed   By: Eddie Candle M.D.   On: 04/18/2019 16:20   Dg Chest 2 View  Result Date: 04/12/2019 CLINICAL DATA:  Post right VATS 02/04/2019.  Small cell lung cancer. EXAM: CHEST - 2 VIEW COMPARISON:  01/04/2018 FINDINGS: Postoperative changes on the right with volume loss. No confluent airspace opacities or significant effusions. Heart is normal size. No acute bony abnormality. IMPRESSION: Postoperative changes on the right with volume  loss. No active disease. Electronically Signed   By: Rolm Baptise M.D.   On: 04/12/2019 12:52   Ct Head Wo Contrast  Result Date: 04/18/2019 CLINICAL DATA:  Multiple falls EXAM: CT HEAD WITHOUT CONTRAST CT CERVICAL SPINE WITHOUT CONTRAST TECHNIQUE: Multidetector CT imaging of the head and cervical spine was performed following the standard protocol without intravenous contrast. Multiplanar CT image reconstructions of the cervical spine were also generated. COMPARISON:  MR brain, 04/05/2019 FINDINGS: CT HEAD FINDINGS Brain: No evidence of acute infarction, hemorrhage, hydrocephalus, extra-axial collection or mass lesion/mass effect. Vascular: No hyperdense vessel or unexpected calcification. Skull: Normal. Negative for fracture or focal lesion. Sinuses/Orbits: No acute finding. Other: None. CT CERVICAL SPINE FINDINGS Alignment: Normal. Skull base and vertebrae: No acute fracture. No primary bone lesion or focal pathologic process. Soft tissues and spinal canal: No prevertebral fluid or swelling. No visible canal hematoma. Disc levels: Mild multilevel disc space height loss and osteophytosis. Upper chest: Negative. Other: None. IMPRESSION: 1.  No acute intracranial pathology. 2.  No fracture or static subluxation of the cervical spine. Electronically Signed   By: Eddie Candle M.D.   On: 04/18/2019 17:21   Ct Cervical Spine Wo Contrast  Result Date: 04/18/2019 CLINICAL DATA:  Multiple falls EXAM: CT HEAD WITHOUT CONTRAST CT CERVICAL SPINE WITHOUT CONTRAST TECHNIQUE: Multidetector CT imaging of the head and cervical spine was performed following the standard protocol without intravenous contrast. Multiplanar CT image reconstructions of the cervical spine were also generated. COMPARISON:  MR brain, 04/05/2019 FINDINGS: CT HEAD FINDINGS Brain: No evidence of acute infarction, hemorrhage, hydrocephalus, extra-axial collection or mass lesion/mass effect. Vascular: No hyperdense vessel or unexpected calcification.  Skull: Normal. Negative for fracture or focal lesion. Sinuses/Orbits: No acute finding. Other: None. CT CERVICAL SPINE FINDINGS Alignment: Normal. Skull base and vertebrae: No acute fracture. No primary bone lesion or focal pathologic process. Soft tissues and spinal canal: No prevertebral fluid or swelling. No visible canal hematoma. Disc levels: Mild multilevel disc space height loss and osteophytosis. Upper chest: Negative. Other: None. IMPRESSION: 1.  No acute intracranial pathology. 2.  No fracture or static subluxation of the cervical spine. Electronically Signed   By: Eddie Candle M.D.   On: 04/18/2019 17:21

## 2019-05-10 NOTE — Telephone Encounter (Signed)
"  Nausea -taking compazine-like water doesn't help, bad taste in her mouth and tongue coated white".

## 2019-05-10 NOTE — Patient Instructions (Signed)
Oral Thrush, Adult  Oral thrush is an infection in your mouth and throat. It causes white patches on your tongue and in your mouth. Follow these instructions at home: Helping with soreness   To lessen your pain: ? Drink cold liquids, like water and iced tea. ? Eat frozen ice pops or frozen juices. ? Eat foods that are easy to swallow, like gelatin and ice cream. ? Drink from a straw if the patches in your mouth are painful. General instructions  Take or use over-the-counter and prescription medicines only as told by your doctor. Medicine for oral thrush may be something to swallow, or it may be something to put on the infected area.  Eat plain yogurt that has live cultures in it. Read the label to make sure.  If you wear dentures: ? Take out your dentures before you go to bed. ? Brush them well. ? Soak them in a denture cleaner.  Rinse your mouth with warm salt-water many times a day. To make the salt-water mixture, completely dissolve 1/2-1 teaspoon of salt in 1 cup of warm water. Contact a doctor if:  Your problems are getting worse.  Your problems do not get better in less than 7 days with treatment.  Your infection is spreading. This may show as white patches on the skin outside of your mouth.  You are nursing your baby and you have redness and pain in the nipples. This information is not intended to replace advice given to you by your health care provider. Make sure you discuss any questions you have with your health care provider. Document Released: 02/12/2010 Document Revised: 08/12/2016 Document Reviewed: 08/12/2016 Elsevier Interactive Patient Education  2019 Elsevier Inc.  

## 2019-05-11 ENCOUNTER — Ambulatory Visit (INDEPENDENT_AMBULATORY_CARE_PROVIDER_SITE_OTHER): Payer: Medicare HMO | Admitting: Thoracic Surgery (Cardiothoracic Vascular Surgery)

## 2019-05-11 ENCOUNTER — Encounter: Payer: Self-pay | Admitting: Thoracic Surgery (Cardiothoracic Vascular Surgery)

## 2019-05-11 VITALS — BP 117/66 | HR 81 | Temp 97.7°F | Resp 16 | Ht 62.0 in | Wt 129.9 lb

## 2019-05-11 DIAGNOSIS — Z902 Acquired absence of lung [part of]: Secondary | ICD-10-CM | POA: Diagnosis not present

## 2019-05-11 DIAGNOSIS — R911 Solitary pulmonary nodule: Secondary | ICD-10-CM | POA: Diagnosis not present

## 2019-05-11 DIAGNOSIS — C3491 Malignant neoplasm of unspecified part of right bronchus or lung: Secondary | ICD-10-CM | POA: Diagnosis not present

## 2019-05-11 MED ORDER — METOCLOPRAMIDE HCL 5 MG PO TABS: 5.0000 mg | ORAL_TABLET | Freq: Three times a day (TID) | ORAL | 2 refills | Status: DC

## 2019-05-11 NOTE — Progress Notes (Signed)
[address]       [place] [address]             [phone]     HPI: Tonya Bradley returns for scheduled postoperative visit  Tonya Bradley is a 68 year old woman with a history of tobacco abuse, COPD, atrial flutter, ablation, atrial fibrillation, coronary artery disease, hyperlipidemia, reflux, osteoporosis, depression, anxiety, and arthritis.  She had a low-dose screening CT for lung cancer and was found to have a lung nodule.  I did a thoracoscopic upper and middle lobectomy on 02/04/2019.  It turned out to be a stage Ia small cell carcinoma.  She had some difficulty with mucous plugging postoperatively and required bronchoscopy.  I did a telephone encounter on 03/09/2019.  She was still having a lot of pain at that time.  In the interim she has seen Dr. Julien Nordmann and is started on chemotherapy with cisplatin and etoposide.  Since she started the chemo she has had persistent nausea.  She was diagnosed recently with thrush and started on Diflucan yesterday.  She is not having any incisional pain and is no longer using oxycodone.  Lost about 50 pounds since her surgery.  Past Medical History:  Diagnosis Date  . Anemia   . Anxiety   . Arthritis    "back"  . Atrial flutter ([place]) 03/13/2016  . CAD (coronary artery disease)   . Cancer ([place])    skin cancer  . Cataract   . Colon polyp   . Complication of anesthesia    BP dropped during esophagus diltation in 2017-admitted for 3 days.   Marland Kitchen COPD (chronic obstructive pulmonary disease) ([place])   . Depression   . Dysrhythmia   . Emphysema of lung ([place])   . GERD (gastroesophageal reflux disease)   . Hypercholesterolemia   . Lung cancer ([place])   . Osteoporosis   . Oxygen deficiency     Current Outpatient Medications  Medication Sig Dispense Refill  . ALPRAZolam (XANAX) 0.5 MG tablet Take 1 tablet (0.5 mg total) by mouth 2 (two) times daily. 60 tablet 2  . amiodarone (PACERONE) 200 MG tablet TAKE 1/2 TABLET BY MOUTH ONCE  DAILY 90 tablet 3  . atorvastatin (LIPITOR) 80 MG tablet TAKE 1 TABLET(80 MG) BY MOUTH DAILY 90 tablet 1  . Cholecalciferol (VITAMIN D) 2000 units CAPS Take 2,000 Units by mouth daily.    . cycloSPORINE (RESTASIS) 0.05 % ophthalmic emulsion Place 1 drop into both eyes 2 (two) times daily.    Marland Kitchen dexamethasone (DECADRON) 4 MG tablet TAKE 1 TABLET(4 MG) BY MOUTH TWICE DAILY FOR 5 DAYS AFTER CHEMOTHERAPY 30 tablet 0  . Dexlansoprazole (DEXILANT) 30 MG capsule Take 1 capsule (30 mg total) by mouth daily. 90 capsule 3  . diltiazem (CARDIZEM CD) 120 MG 24 hr capsule TAKE 1 CAPSULE(120 MG) BY MOUTH DAILY (Patient taking differently: Take 120 mg by mouth daily. ) 90 capsule 2  . DULoxetine (CYMBALTA) 60 MG capsule Take 1 capsule (60 mg total) by mouth daily. (Patient taking differently: Take 60 mg by mouth at bedtime. ) 90 capsule 1  . fluconazole (DIFLUCAN) 100 MG tablet Take 1 tablet (100 mg total) by mouth daily. (Patient taking differently: Take 100 mg by mouth daily. X 10 DAYS ENDING 05/20/19) 10 tablet 0  . furosemide (LASIX) 20 MG tablet TAKE 1 TABLET BY MOUTH THREE TIMES DAILY 270 tablet 0  . guaiFENesin (MUCINEX) 600 MG 12 hr tablet Take 2 tablets (1,200 mg  total) by mouth 2 (two) times daily. For 7 days, then use as needed 60 tablet 1  . LINZESS 290 MCG CAPS capsule TAKE 1 CAPSULE(290 MCG) BY MOUTH DAILY AS NEEDED FOR CONSTIPATION 30 capsule 0  . magnesium oxide (MAG-OX) 400 (241.3 Mg) MG tablet 1 tablet twice daily 60 tablet 1  . metoprolol tartrate (LOPRESSOR) 25 MG tablet Take 1 tablet (25 mg total) by mouth 2 (two) times daily. 60 tablet 3  . Multiple Vitamin (MULTIVITAMIN WITH MINERALS) TABS tablet Take 1 tablet by mouth daily.    . ondansetron (ZOFRAN) 4 MG tablet TAKE 1 TABLET(4 MG) BY MOUTH TWICE DAILY AS NEEDED FOR NAUSEA OR VOMITING 40 tablet 0  . potassium chloride (K-DUR) 10 MEQ tablet TAKE 1 TABLET(10 MEQ) BY MOUTH DAILY (Patient taking differently: Take 10 mEq by mouth daily. ) 90 tablet  2  . prochlorperazine (COMPAZINE) 10 MG tablet TAKE 1 TABLET(10 MG) BY MOUTH EVERY 6 HOURS AS NEEDED FOR NAUSEA OR VOMITING 30 tablet 0  . QUEtiapine (SEROQUEL) 100 MG tablet Take 1 tablet (100 mg total) by mouth at bedtime. 30 tablet 1  . rOPINIRole (REQUIP) 0.25 MG tablet TAKE 2 TABLETS(0.5 MG) BY MOUTH AT BEDTIME (Patient taking differently: Take 0.5 mg by mouth at bedtime. ) 180 tablet 0  . TRELEGY ELLIPTA 100-62.5-25 MCG/INH AEPB INHALE 1 PUFF INTO THE LUNGS DAILY (Patient taking differently: Inhale 1 puff into the lungs daily. ) 60 each 3  . VENTOLIN HFA 108 (90 Base) MCG/ACT inhaler INHALE 2 PUFFS INTO THE LUNGS EVERY 6 HOURS AS NEEDED FOR WHEEZING OR SHORTNESS OF BREATH (Patient taking differently: Inhale 2 puffs into the lungs every 6 (six) hours as needed (wheezing/shortness of breath). ) 18 g 0  . loratadine (CLARITIN) 10 MG tablet Take 10 mg by mouth daily.    . metoCLOPramide (REGLAN) 5 MG tablet Take 1 tablet (5 mg total) by mouth 3 (three) times daily before meals. 90 tablet 2  . nitroGLYCERIN (NITROSTAT) 0.4 MG SL tablet Place 1 tablet (0.4 mg total) under the tongue every 5 (five) minutes as needed for chest pain. Reported on 06/12/2016 30 tablet 2   No current facility-administered medications for this visit.     Physical Exam BP 117/66 (BP Location: Left Arm, Patient Position: Sitting, Cuff Size: Normal)   Pulse 81   Temp 97.7 F (36.5 C) (Skin)   Resp 16   Ht 5\' 2"  (1.575 m)   Wt 129 lb 13.6 oz (58.9 kg)   SpO2 (!) 89% Comment: ON RA...didn't bring portable O2  BMI 23.50 kg/m  68 year old woman in no acute distress Alert and oriented x3 with no focal deficits Lungs diminished at right base, otherwise clear Incisions well-healed Cardiac regular rate and rhythm normal S1 and S2  Diagnostic Tests: I reviewed her most recent chest x-ray from 04/18/2019.  Shows postoperative changes.  Lungs clear.  Impression: Tonya Bradley is a 68 year old woman with multiple medical  problems who underwent a thoracoscopic right upper middle lobectomy for a stage Ia small cell carcinoma about 3 months ago.  She had a lot of difficulty with mucous plugging and required bronchoscopy.  She also had a lot of pain in the early postoperative.  Fortunately both of those issues have resolved.  Her current primary complaint is severe nausea.  She says that started with the chemotherapy.  She is been on 3 different drugs which have not helped.  She currently is using Zofran and Compazine without much improvement.  She was recently started on Diflucan for thrush which could be playing some role in this.  She is desperate for some relief, give her a prescription for Reglan 5 mg p.o. 3 times daily, 90 tablets, 2 refills.  We will see if that makes any difference.    She will continue chemotherapy with Dr. Julien Nordmann  Plan: Continue to follow-up with Dr. Julien Nordmann I will see her back in 9 months with a PA lateral chest x-ray for 1 year follow-up  Melrose Nakayama, MD [facility] [phone]

## 2019-05-12 ENCOUNTER — Other Ambulatory Visit: Payer: Self-pay | Admitting: Family Medicine

## 2019-05-17 ENCOUNTER — Other Ambulatory Visit: Payer: Self-pay

## 2019-05-17 ENCOUNTER — Inpatient Hospital Stay: Payer: Medicare HMO

## 2019-05-17 DIAGNOSIS — B370 Candidal stomatitis: Secondary | ICD-10-CM | POA: Diagnosis not present

## 2019-05-17 DIAGNOSIS — R110 Nausea: Secondary | ICD-10-CM | POA: Diagnosis not present

## 2019-05-17 DIAGNOSIS — Z5111 Encounter for antineoplastic chemotherapy: Secondary | ICD-10-CM | POA: Diagnosis not present

## 2019-05-17 DIAGNOSIS — Z720 Tobacco use: Secondary | ICD-10-CM | POA: Diagnosis not present

## 2019-05-17 DIAGNOSIS — C3491 Malignant neoplasm of unspecified part of right bronchus or lung: Secondary | ICD-10-CM

## 2019-05-17 DIAGNOSIS — Z5189 Encounter for other specified aftercare: Secondary | ICD-10-CM | POA: Diagnosis not present

## 2019-05-17 DIAGNOSIS — I4892 Unspecified atrial flutter: Secondary | ICD-10-CM | POA: Diagnosis not present

## 2019-05-17 DIAGNOSIS — C3411 Malignant neoplasm of upper lobe, right bronchus or lung: Secondary | ICD-10-CM | POA: Diagnosis not present

## 2019-05-17 DIAGNOSIS — B3781 Candidal esophagitis: Secondary | ICD-10-CM | POA: Diagnosis not present

## 2019-05-17 DIAGNOSIS — R635 Abnormal weight gain: Secondary | ICD-10-CM | POA: Diagnosis not present

## 2019-05-17 LAB — CMP (CANCER CENTER ONLY)
ALT: 26 U/L (ref 0–44)
AST: 25 U/L (ref 15–41)
Albumin: 3 g/dL — ABNORMAL LOW (ref 3.5–5.0)
Alkaline Phosphatase: 157 U/L — ABNORMAL HIGH (ref 38–126)
Anion gap: 11 (ref 5–15)
BUN: 16 mg/dL (ref 8–23)
CO2: 33 mmol/L — ABNORMAL HIGH (ref 22–32)
Calcium: 8.9 mg/dL (ref 8.9–10.3)
Chloride: 97 mmol/L — ABNORMAL LOW (ref 98–111)
Creatinine: 0.82 mg/dL (ref 0.44–1.00)
GFR, Est AFR Am: >60 mL/min (ref >60)
GFR, Estimated: >60 mL/min (ref >60)
Glucose, Bld: 117 mg/dL — ABNORMAL HIGH (ref 70–99)
Potassium: 3.3 mmol/L — ABNORMAL LOW (ref 3.5–5.1)
Sodium: 141 mmol/L (ref 135–145)
Total Bilirubin: <0.2 mg/dL — ABNORMAL LOW (ref 0.3–1.2)
Total Protein: 5.9 g/dL — ABNORMAL LOW (ref 6.5–8.1)

## 2019-05-17 LAB — CBC WITH DIFFERENTIAL (CANCER CENTER ONLY)
Abs Immature Granulocytes: 6.52 10*3/uL — ABNORMAL HIGH (ref 0.00–0.07)
Basophils Absolute: 0 10*3/uL (ref 0.0–0.1)
Basophils Relative: 0 %
Eosinophils Absolute: 0 10*3/uL (ref 0.0–0.5)
Eosinophils Relative: 0 %
HCT: 31.4 % — ABNORMAL LOW (ref 36.0–46.0)
Hemoglobin: 9.9 g/dL — ABNORMAL LOW (ref 12.0–15.0)
Immature Granulocytes: 32 %
Lymphocytes Relative: 13 %
Lymphs Abs: 2.7 10*3/uL (ref 0.7–4.0)
MCH: 28 pg (ref 26.0–34.0)
MCHC: 31.5 g/dL (ref 30.0–36.0)
MCV: 89 fL (ref 80.0–100.0)
Monocytes Absolute: 1 10*3/uL (ref 0.1–1.0)
Monocytes Relative: 5 %
Neutro Abs: 10.4 10*3/uL — ABNORMAL HIGH (ref 1.7–7.7)
Neutrophils Relative %: 50 %
Platelet Count: 98 10*3/uL — ABNORMAL LOW (ref 150–400)
RBC: 3.53 MIL/uL — ABNORMAL LOW (ref 3.87–5.11)
RDW: 14.6 % (ref 11.5–15.5)
WBC Count: 20.6 10*3/uL — ABNORMAL HIGH (ref 4.0–10.5)
nRBC: 0.1 % (ref 0.0–0.2)

## 2019-05-17 LAB — MAGNESIUM: Magnesium: 1.8 mg/dL (ref 1.7–2.4)

## 2019-05-24 ENCOUNTER — Ambulatory Visit: Payer: Medicare HMO | Admitting: Internal Medicine

## 2019-05-24 ENCOUNTER — Other Ambulatory Visit: Payer: Medicare HMO

## 2019-05-24 ENCOUNTER — Ambulatory Visit: Payer: Medicare HMO

## 2019-05-25 ENCOUNTER — Ambulatory Visit: Payer: Medicare HMO

## 2019-05-26 ENCOUNTER — Encounter: Payer: Self-pay | Admitting: Physician Assistant

## 2019-05-26 ENCOUNTER — Other Ambulatory Visit: Payer: Self-pay

## 2019-05-26 ENCOUNTER — Inpatient Hospital Stay (HOSPITAL_BASED_OUTPATIENT_CLINIC_OR_DEPARTMENT_OTHER): Payer: Medicare HMO | Admitting: Physician Assistant

## 2019-05-26 ENCOUNTER — Telehealth: Payer: Self-pay | Admitting: Internal Medicine

## 2019-05-26 ENCOUNTER — Inpatient Hospital Stay: Payer: Medicare HMO

## 2019-05-26 ENCOUNTER — Ambulatory Visit: Payer: Medicare HMO

## 2019-05-26 VITALS — BP 120/60 | HR 93 | Temp 98.9°F | Resp 17 | Ht 62.0 in | Wt 144.7 lb

## 2019-05-26 DIAGNOSIS — B370 Candidal stomatitis: Secondary | ICD-10-CM

## 2019-05-26 DIAGNOSIS — E7800 Pure hypercholesterolemia, unspecified: Secondary | ICD-10-CM

## 2019-05-26 DIAGNOSIS — I2510 Atherosclerotic heart disease of native coronary artery without angina pectoris: Secondary | ICD-10-CM

## 2019-05-26 DIAGNOSIS — M810 Age-related osteoporosis without current pathological fracture: Secondary | ICD-10-CM

## 2019-05-26 DIAGNOSIS — C3491 Malignant neoplasm of unspecified part of right bronchus or lung: Secondary | ICD-10-CM

## 2019-05-26 DIAGNOSIS — Z5189 Encounter for other specified aftercare: Secondary | ICD-10-CM | POA: Diagnosis not present

## 2019-05-26 DIAGNOSIS — B3781 Candidal esophagitis: Secondary | ICD-10-CM | POA: Diagnosis not present

## 2019-05-26 DIAGNOSIS — F329 Major depressive disorder, single episode, unspecified: Secondary | ICD-10-CM

## 2019-05-26 DIAGNOSIS — J439 Emphysema, unspecified: Secondary | ICD-10-CM

## 2019-05-26 DIAGNOSIS — Z5111 Encounter for antineoplastic chemotherapy: Secondary | ICD-10-CM | POA: Diagnosis not present

## 2019-05-26 DIAGNOSIS — R112 Nausea with vomiting, unspecified: Secondary | ICD-10-CM | POA: Insufficient documentation

## 2019-05-26 DIAGNOSIS — R635 Abnormal weight gain: Secondary | ICD-10-CM | POA: Diagnosis not present

## 2019-05-26 DIAGNOSIS — Z720 Tobacco use: Secondary | ICD-10-CM | POA: Diagnosis not present

## 2019-05-26 DIAGNOSIS — Z86010 Personal history of colonic polyps: Secondary | ICD-10-CM

## 2019-05-26 DIAGNOSIS — I4892 Unspecified atrial flutter: Secondary | ICD-10-CM | POA: Diagnosis not present

## 2019-05-26 DIAGNOSIS — C3411 Malignant neoplasm of upper lobe, right bronchus or lung: Secondary | ICD-10-CM

## 2019-05-26 DIAGNOSIS — K219 Gastro-esophageal reflux disease without esophagitis: Secondary | ICD-10-CM

## 2019-05-26 DIAGNOSIS — Z79899 Other long term (current) drug therapy: Secondary | ICD-10-CM

## 2019-05-26 DIAGNOSIS — R110 Nausea: Secondary | ICD-10-CM | POA: Diagnosis not present

## 2019-05-26 DIAGNOSIS — Z85028 Personal history of other malignant neoplasm of stomach: Secondary | ICD-10-CM

## 2019-05-26 LAB — CMP (CANCER CENTER ONLY)
ALT: 26 U/L (ref 0–44)
AST: 19 U/L (ref 15–41)
Albumin: 2.9 g/dL — ABNORMAL LOW (ref 3.5–5.0)
Alkaline Phosphatase: 172 U/L — ABNORMAL HIGH (ref 38–126)
Anion gap: 9 (ref 5–15)
BUN: 17 mg/dL (ref 8–23)
CO2: 34 mmol/L — ABNORMAL HIGH (ref 22–32)
Calcium: 8.7 mg/dL — ABNORMAL LOW (ref 8.9–10.3)
Chloride: 97 mmol/L — ABNORMAL LOW (ref 98–111)
Creatinine: 0.67 mg/dL (ref 0.44–1.00)
GFR, Est AFR Am: >60 mL/min (ref >60)
GFR, Estimated: >60 mL/min (ref >60)
Glucose, Bld: 124 mg/dL — ABNORMAL HIGH (ref 70–99)
Potassium: 3.3 mmol/L — ABNORMAL LOW (ref 3.5–5.1)
Sodium: 140 mmol/L (ref 135–145)
Total Bilirubin: 0.2 mg/dL — ABNORMAL LOW (ref 0.3–1.2)
Total Protein: 6.1 g/dL — ABNORMAL LOW (ref 6.5–8.1)

## 2019-05-26 LAB — CBC WITH DIFFERENTIAL (CANCER CENTER ONLY)
Abs Immature Granulocytes: 0.79 10*3/uL — ABNORMAL HIGH (ref 0.00–0.07)
Basophils Absolute: 0.1 10*3/uL (ref 0.0–0.1)
Basophils Relative: 0 %
Eosinophils Absolute: 0 10*3/uL (ref 0.0–0.5)
Eosinophils Relative: 0 %
HCT: 27.4 % — ABNORMAL LOW (ref 36.0–46.0)
Hemoglobin: 8.6 g/dL — ABNORMAL LOW (ref 12.0–15.0)
Immature Granulocytes: 4 %
Lymphocytes Relative: 8 %
Lymphs Abs: 1.6 10*3/uL (ref 0.7–4.0)
MCH: 28.3 pg (ref 26.0–34.0)
MCHC: 31.4 g/dL (ref 30.0–36.0)
MCV: 90.1 fL (ref 80.0–100.0)
Monocytes Absolute: 2 10*3/uL — ABNORMAL HIGH (ref 0.1–1.0)
Monocytes Relative: 10 %
Neutro Abs: 16.1 10*3/uL — ABNORMAL HIGH (ref 1.7–7.7)
Neutrophils Relative %: 78 %
Platelet Count: 326 10*3/uL (ref 150–400)
RBC: 3.04 MIL/uL — ABNORMAL LOW (ref 3.87–5.11)
RDW: 16.8 % — ABNORMAL HIGH (ref 11.5–15.5)
WBC Count: 20.5 10*3/uL — ABNORMAL HIGH (ref 4.0–10.5)
nRBC: 0.1 % (ref 0.0–0.2)

## 2019-05-26 LAB — MAGNESIUM: Magnesium: 2.1 mg/dL (ref 1.7–2.4)

## 2019-05-26 MED ORDER — SODIUM CHLORIDE 0.9 % IV SOLN
Freq: Once | INTRAVENOUS | Status: AC
  Administered 2019-05-26: 13:00:00 via INTRAVENOUS
  Filled 2019-05-26: qty 5

## 2019-05-26 MED ORDER — POTASSIUM CHLORIDE 2 MEQ/ML IV SOLN
Freq: Once | INTRAVENOUS | Status: AC
  Administered 2019-05-26: 10:00:00 via INTRAVENOUS
  Filled 2019-05-26: qty 10

## 2019-05-26 MED ORDER — PALONOSETRON HCL INJECTION 0.25 MG/5ML
INTRAVENOUS | Status: AC
  Filled 2019-05-26: qty 5

## 2019-05-26 MED ORDER — SODIUM CHLORIDE 0.9 % IV SOLN
Freq: Once | INTRAVENOUS | Status: AC
  Administered 2019-05-26: 10:00:00 via INTRAVENOUS
  Filled 2019-05-26: qty 250

## 2019-05-26 MED ORDER — SODIUM CHLORIDE 0.9 % IV SOLN
80.0000 mg/m2 | Freq: Once | INTRAVENOUS | Status: AC
  Administered 2019-05-26: 136 mg via INTRAVENOUS
  Filled 2019-05-26: qty 136

## 2019-05-26 MED ORDER — SODIUM CHLORIDE 0.9 % IV SOLN
100.0000 mg/m2 | Freq: Once | INTRAVENOUS | Status: AC
  Administered 2019-05-26: 170 mg via INTRAVENOUS
  Filled 2019-05-26: qty 8.5

## 2019-05-26 MED ORDER — PALONOSETRON HCL INJECTION 0.25 MG/5ML
0.2500 mg | Freq: Once | INTRAVENOUS | Status: AC
  Administered 2019-05-26: 0.25 mg via INTRAVENOUS

## 2019-05-26 NOTE — Patient Instructions (Signed)
[facility] Discharge Instructions for Patients Receiving Chemotherapy  Today you received the following chemotherapy agents cisplatin and etoposide   To help prevent nausea and vomiting after your treatment, we encourage you to take your nausea medication as directed  If you develop nausea and vomiting that is not controlled by your nausea medication, call the clinic.   BELOW ARE SYMPTOMS THAT SHOULD BE REPORTED IMMEDIATELY:  *FEVER GREATER THAN 100.5 F  *CHILLS WITH OR WITHOUT FEVER  NAUSEA AND VOMITING THAT IS NOT CONTROLLED WITH YOUR NAUSEA MEDICATION  *UNUSUAL SHORTNESS OF BREATH  *UNUSUAL BRUISING OR BLEEDING  TENDERNESS IN MOUTH AND THROAT WITH OR WITHOUT PRESENCE OF ULCERS  *URINARY PROBLEMS  *BOWEL PROBLEMS  UNUSUAL RASH Items with * indicate a potential emergency and should be followed up as soon as possible.  Feel free to call the clinic should you have any questions or concerns. The clinic phone number is [phone].  Please show the [facility] at check-in to the Emergency Department and triage nurse.

## 2019-05-26 NOTE — Progress Notes (Signed)
[facility] OFFICE PROGRESS NOTE  [place], Tonya G, MD [place] [address]  DIAGNOSIS: Limited stage, stage Ia (T1b, N0, M0) small cell lung cancer   PRIOR THERAPY: Status post right upper and middle bilobectomy with lymph node dissection under the care of Dr. [name] January 31, 2019.  CURRENT THERAPY: Chemotherapy with cisplatin 80 mg/M2 on day 1 and etoposide 100 mg/M2 on days 1, 2 and 3 every 3 weeks.  Status post 2 cycle.  INTERVAL HISTORY: Tonya Bradley 68 y.o. female returns to the clinic for a follow-up visit.  The patient is feeling fairly well today with no concerning complaints except for continued  nausea with vomiting. She states that she experiences nausea the day of treatment and that it persists for about 8-10 days. She states that she typicalluy has 1-2 episodes of vomiting as well. She is prescribed Compazine, Zofran, and Decadron for her nausea. She recently followed up with Dr. [name] from cardiothoracic surgery from her recent bilobectomy. He gave her a prescription for Reglan. She states she has been taking taking compazine, Reglan, or Zofran for nausea as needed. She was given a prescription for 4 mg of decadron to be taken BID; however, she had not been taking it consistently or as prescribed. She denies any associated appetite change or weight loss. She actually gained 15 lbs in the past 2 week period. She states she is followed closely by cardiology and takes lasix. She has noticed increased lower extremity swelling recently. She has a follow up appointment with them tomorrow.    Otherwise, she tolerated her last treatment well without any other adverse effects. She denies any fever, chills, or night sweats. She reports her usual baseline productive cough which produces clear sputum and nasal congestion secondary to seasonal allergies. She denies any chest pain, shortness of breath, or hemoptysis. She denies any sore throat. She  denies any headaches or visual changes.  She denies any constipation but endorses diarrhea secondary to her medication use, however, she cannot recall which medications. She developed oral thrush following treatment which was managed with Diflucan. This has completely resolved at this time.  She is here today for evaluation before starting cycle #3.   MEDICAL HISTORY: Past Medical History:  Diagnosis Date  . Anemia   . Anxiety   . Arthritis    "back"  . Atrial flutter ([place]) 03/13/2016  . CAD (coronary artery disease)   . Cancer ([place])    skin cancer  . Cataract   . Colon polyp   . Complication of anesthesia    BP dropped during esophagus diltation in 2017-admitted for 3 days.   Marland Kitchen COPD (chronic obstructive pulmonary disease) ([place])   . Depression   . Dysrhythmia   . Emphysema of lung ([place])   . GERD (gastroesophageal reflux disease)   . Hypercholesterolemia   . Lung cancer ([place])   . Osteoporosis   . Oxygen deficiency     ALLERGIES:  is allergic to morphine and related.  MEDICATIONS:  Current Outpatient Medications  Medication Sig Dispense Refill  . ALPRAZolam (XANAX) 0.5 MG tablet Take 1 tablet (0.5 mg total) by mouth 2 (two) times daily. 60 tablet 2  . amiodarone (PACERONE) 200 MG tablet TAKE 1/2 TABLET BY MOUTH ONCE DAILY 90 tablet 3  . atorvastatin (LIPITOR) 80 MG tablet TAKE 1 TABLET(80 MG) BY MOUTH DAILY 90 tablet 1  . Cholecalciferol (VITAMIN D) 2000 units CAPS Take 2,000 Units by mouth daily.    Marland Kitchen  cycloSPORINE (RESTASIS) 0.05 % ophthalmic emulsion Place 1 drop into both eyes 2 (two) times daily.    Marland Kitchen dexamethasone (DECADRON) 4 MG tablet TAKE 1 TABLET(4 MG) BY MOUTH TWICE DAILY FOR 5 DAYS AFTER CHEMOTHERAPY 30 tablet 0  . Dexlansoprazole (DEXILANT) 30 MG capsule Take 1 capsule (30 mg total) by mouth daily. 90 capsule 3  . diltiazem (CARDIZEM CD) 120 MG 24 hr capsule TAKE 1 CAPSULE(120 MG) BY MOUTH DAILY (Patient taking differently: Take 120 mg by mouth daily. ) 90 capsule 2   . DULoxetine (CYMBALTA) 60 MG capsule Take 1 capsule (60 mg total) by mouth daily. (Patient taking differently: Take 60 mg by mouth at bedtime. ) 90 capsule 1  . furosemide (LASIX) 20 MG tablet TAKE 1 TABLET BY MOUTH THREE TIMES DAILY 270 tablet 0  . guaiFENesin (MUCINEX) 600 MG 12 hr tablet Take 2 tablets (1,200 mg total) by mouth 2 (two) times daily. For 7 days, then use as needed 60 tablet 1  . LINZESS 290 MCG CAPS capsule TAKE 1 CAPSULE(290 MCG) BY MOUTH DAILY AS NEEDED FOR CONSTIPATION 30 capsule 0  . loratadine (CLARITIN) 10 MG tablet Take 10 mg by mouth daily.    . magnesium oxide (MAG-OX) 400 (241.3 Mg) MG tablet 1 tablet twice daily 60 tablet 1  . metoCLOPramide (REGLAN) 5 MG tablet Take 1 tablet (5 mg total) by mouth 3 (three) times daily before meals. 90 tablet 2  . metoprolol tartrate (LOPRESSOR) 25 MG tablet Take 1 tablet (25 mg total) by mouth 2 (two) times daily. 60 tablet 3  . Multiple Vitamin (MULTIVITAMIN WITH MINERALS) TABS tablet Take 1 tablet by mouth daily.    . ondansetron (ZOFRAN) 4 MG tablet TAKE 1 TABLET(4 MG) BY MOUTH TWICE DAILY AS NEEDED FOR NAUSEA OR VOMITING 40 tablet 0  . potassium chloride (K-DUR) 10 MEQ tablet TAKE 1 TABLET(10 MEQ) BY MOUTH DAILY (Patient taking differently: Take 10 mEq by mouth daily. ) 90 tablet 2  . prochlorperazine (COMPAZINE) 10 MG tablet TAKE 1 TABLET(10 MG) BY MOUTH EVERY 6 HOURS AS NEEDED FOR NAUSEA OR VOMITING 30 tablet 0  . QUEtiapine (SEROQUEL) 100 MG tablet TAKE 1 TABLET(100 MG) BY MOUTH AT BEDTIME 90 tablet 0  . rOPINIRole (REQUIP) 0.25 MG tablet TAKE 2 TABLETS(0.5 MG) BY MOUTH AT BEDTIME (Patient taking differently: Take 0.5 mg by mouth at bedtime. ) 180 tablet 0  . TRELEGY ELLIPTA 100-62.5-25 MCG/INH AEPB INHALE 1 PUFF INTO THE LUNGS DAILY (Patient taking differently: Inhale 1 puff into the lungs daily. ) 60 each 3  . VENTOLIN HFA 108 (90 Base) MCG/ACT inhaler INHALE 2 PUFFS INTO THE LUNGS EVERY 6 HOURS AS NEEDED FOR WHEEZING OR  SHORTNESS OF BREATH (Patient taking differently: Inhale 2 puffs into the lungs every 6 (six) hours as needed (wheezing/shortness of breath). ) 18 Bradley 0  . fluconazole (DIFLUCAN) 100 MG tablet Take 1 tablet (100 mg total) by mouth daily. (Patient not taking: Reported on 05/26/2019) 10 tablet 0  . nitroGLYCERIN (NITROSTAT) 0.4 MG SL tablet Place 1 tablet (0.4 mg total) under the tongue every 5 (five) minutes as needed for chest pain. Reported on 06/12/2016 (Patient not taking: Reported on 05/26/2019) 30 tablet 2   No current facility-administered medications for this visit.     SURGICAL HISTORY:  Past Surgical History:  Procedure Laterality Date  . A-FLUTTER ABLATION N/A 05/19/2017   Procedure: A-Flutter Ablation;  Surgeon: Evans Lance, MD;  Location: [facility];  Service: Cardiovascular;  Laterality: N/A;  . APPENDECTOMY    . BASAL CELL CARCINOMA EXCISION    . CARDIAC CATHETERIZATION    . CATARACT EXTRACTION Bilateral   . CHOLECYSTECTOMY    . COLONOSCOPY N/A 06/23/2014   Dr. Rourk:Rectal and colonic polyps-removed, Pancolonic diverticulosis. lymphocytic colitis  . CORONARY ANGIOPLASTY WITH STENT PLACEMENT    . ESOPHAGEAL DILATION N/A 03/13/2016   Procedure: ESOPHAGEAL DILATION;  Surgeon: Daneil Dolin, MD;  [facility];  Service: Endoscopy;  Laterality: N/A;  . ESOPHAGOGASTRODUODENOSCOPY N/A 03/13/2016   Procedure: ESOPHAGOGASTRODUODENOSCOPY (EGD);  Surgeon: Daneil Dolin, MD;  [facility];  Service: Endoscopy;  Laterality: N/A;  0930  . EYE SURGERY     cataract removed, bilateral  . IR THORACENTESIS ASP PLEURAL SPACE W/IMG GUIDE  02/15/2019  . LUMBAR LAMINECTOMY/DECOMPRESSION MICRODISCECTOMY N/A 01/19/2018   Procedure: L4-5 DECOMPRESSION;  Surgeon: Marybelle Killings, MD;  Location: [facility];  Service: Orthopedics;  Laterality: N/A;  . VIDEO ASSISTED THORACOSCOPY (VATS)/ LOBECTOMY Right 02/04/2019   Procedure: VIDEO ASSISTED THORACOSCOPY (VATS)/RIGHT UPPER AND MIDDLE  LOBECTOMY;  Surgeon: Melrose Nakayama, MD;  Location: [facility];  Service: Thoracic;  Laterality: Right;  Marland Kitchen VIDEO BRONCHOSCOPY N/A 02/14/2019   Procedure: BRONCHOSCOPY WITH SEDATION;  Surgeon: Melrose Nakayama, MD;  Location: [facility] OR;  Service: Thoracic;  Laterality: N/A;    REVIEW OF SYSTEMS:   Review of Systems  Constitutional: Positive for increased appetite and weight gain. Negative for chills, fatigue, and fever.   HENT: Positive for oral thrush (resolved). Negative for mouth sores, nosebleeds, sore throat and trouble swallowing.   Eyes: Negative for eye problems and icterus.  Respiratory: Positive for baseline productive cough. Negative for hemoptysis, shortness of breath and wheezing.    Cardiovascular: Negative for chest pain and leg swelling.  Gastrointestinal: Positive for diarrhea, nausea, and vomiting. Negative for constipation Genitourinary: Negative for bladder incontinence, difficulty urinating, dysuria, frequency and hematuria.   Musculoskeletal: Negative for back pain, gait problem, neck pain and neck stiffness.  Skin: Negative for itching and rash.  Neurological: Negative for dizziness, extremity weakness, gait problem, headaches, light-headedness and seizures.  Hematological: Negative for adenopathy. Does not bruise/bleed easily.  Psychiatric/Behavioral: Negative for confusion, depression and sleep disturbance. The patient is not nervous/anxious.     PHYSICAL EXAMINATION:  Blood pressure 120/60, pulse 93, temperature 98.9 F (37.2 C), temperature source Oral, resp. rate 17, height 5\' 2"  (1.575 m), weight 144 lb 11.2 oz (65.6 kg), SpO2 93 %.  ECOG PERFORMANCE STATUS: 1 - Symptomatic but completely ambulatory  Physical Exam  Constitutional: Oriented to person, place, and time and well-developed, well-nourished, and in no distress.  HENT:  Head: Normocephalic and atraumatic.  Mouth/Throat: Oropharynx is clear and moist. No oropharyngeal exudate.  Eyes: Conjunctivae  are normal. Right eye exhibits no discharge. Left eye exhibits no discharge. No scleral icterus.  Neck: Normal range of motion. Neck supple.  Cardiovascular: Normal rate, regular rhythm, normal heart sounds and intact distal pulses.   Pulmonary/Chest: Diminished breath sounds at the right lung base. Effort normal and breath sounds normal. No respiratory distress. No wheezes. No rales.  Abdominal: Soft. Bowel sounds are normal. Exhibits no distension and no mass. There is no tenderness.  Musculoskeletal: Bilaterally lower extremity pitting edema. Normal range of motion.  Lymphadenopathy:    No cervical adenopathy.  Neurological: Alert and oriented to person, place, and time. Exhibits normal muscle tone. Gait normal. Coordination normal.  Skin: Skin is warm and dry. No rash noted. Not diaphoretic.  No erythema. No pallor.  Psychiatric: Mood, memory and judgment normal.  Vitals reviewed.  LABORATORY DATA: Lab Results  Component Value Date   WBC 20.5 (H) 05/26/2019   HGB 8.6 (L) 05/26/2019   HCT 27.4 (L) 05/26/2019   MCV 90.1 05/26/2019   PLT 326 05/26/2019      Chemistry      Component Value Date/Time   NA 140 05/26/2019 0806   NA 137 05/12/2017 1142   K 3.3 (L) 05/26/2019 0806   CL 97 (L) 05/26/2019 0806   CO2 34 (H) 05/26/2019 0806   BUN 17 05/26/2019 0806   BUN 18 05/12/2017 1142   CREATININE 0.67 05/26/2019 0806   CREATININE 0.66 06/19/2018 1554      Component Value Date/Time   CALCIUM 8.7 (L) 05/26/2019 0806   ALKPHOS 172 (H) 05/26/2019 0806   AST 19 05/26/2019 0806   ALT 26 05/26/2019 0806   BILITOT 0.2 (L) 05/26/2019 0806       RADIOGRAPHIC STUDIES:  No results found.   ASSESSMENT/PLAN:  This is a very pleasant 68 year old Caucasian female with limited stage, small cell lung cancer.  She had a right upper lobe and middle lobe bilobectomy.  She was diagnosed in March 2020.  She is currently undergoing systemic adjuvant chemotherapy with cisplatin 80 mg/m2 on  day 1 and etoposide 100 mg/m2 on days 1, 2, and 3 every 3 weeks.  She is status post 2 cycles.  She has been tolerating treatment well except she develops nausea and associated vomiting.   The patient was seen today with Dr. Julien Nordmann.  Labs were reviewed with the patient.  We recommend that she proceed with cycle #3 today scheduled. I will see her back for follow-up visit in 3 weeks for evaluation before starting cycle #4.  Regarding her nausea, I instructed the patient to not take Reglan and compazine together. I reviewed the patient's medication list with her. She had not been taking her medications as prescribed. I instructed the patient to take her compazine every 6 hours as needed for nausea and vomiting. Additionally, she was recently given a prescription for decadron to take 4 mg BID for 5 days. She was instructed to start that tonight. If needed, she may take Zofran; however, she was instructed not to take this until at least 3 days following chemotherapy.   The patient will be following up with her cardiologist tomorrow. She will discuss her leg swelling and weight gain with them for their recommendations. Additionally, the patient's potassium was slightly low today at 3.3. She is currently taking potassium supplements. I gave her a handout on potassium rich foods. She will discuss with her cardiologist tomorrow on their recommendation for potassium dosage considering she is on lasix.   The patient experiences seasonal allergies. She has only been taking Claritin with her neulasta injections. She was advised that she may take Claritin daily for her seasonal allergies.   The patient was advised to call immediately if she has any concerning symptoms in the interval. The patient voices understanding of current disease status and treatment options and is in agreement with the current care plan. All questions were answered. The patient knows to call the clinic with any problems, questions or  concerns. We can certainly see the patient much sooner if necessary  No orders of the defined types were placed in this encounter.    Christa Fasig L Cephus Tupy, PA-C 05/26/19  ADDENDUM: Hematology/Oncology Attending: I had a face-to-face encounter with the patient today.  I recommended her care plan.  This is a very pleasant 68 years old white female with limited stage small cell lung cancer status post right upper and middle bilobectomy. The patient is currently undergoing adjuvant treatment with systemic chemotherapy with cisplatin and etoposide status post 2 cycles.  She has been tolerating the treatment well except for delayed nausea. She is currently on treatment with several antiemetics including Compazine Zofran and Reglan.  I recommended for the patient she discontinued her treatment with Reglan. She will continue on Compazine and Zofran if needed several days after the chemotherapy.  We also provided the patient with prescription for Decadron 4 mg p.o. twice daily to be used for few days after her chemotherapy. We will see her back for follow-up visit in 3 weeks for evaluation before the next cycle of her treatment. She was advised to call immediately if she has any concerning symptoms in the interval.  Disclaimer: This note was dictated with voice recognition software. Similar sounding words can inadvertently be transcribed and may be missed upon review. Eilleen Kempf, MD 05/26/19

## 2019-05-26 NOTE — Telephone Encounter (Signed)
New Message     CONSENT FOR TELE-HEALTH VISIT - PLEASE REVIEW TO PATIENT  I hereby voluntarily request, consent and authorize [facility] and its employed or contracted physicians, physician assistants, nurse practitioners or other licensed health care professionals (the Practitioner), to provide me with telemedicine health care services (the "Services") as deemed necessary by the treating Practitioner. I acknowledge and consent to receive the Services by the Practitioner via telemedicine. I understand that the telemedicine visit will involve communicating with the Practitioner through live audiovisual communication technology and the disclosure of certain medical information by electronic transmission. I acknowledge that I have been given the opportunity to request an in-person assessment or other available alternative prior to the telemedicine visit and am voluntarily participating in the telemedicine visit.  I understand that I have the right to withhold or withdraw my consent to the use of telemedicine in the course of my care at any time, without affecting my right to future care or treatment, and that the Practitioner or I may terminate the  telemedicine visit at any time. I understand that I have the right to inspect all information obtained and/or recorded in the course of the telemedicine visit and may receive copies of available information for a reasonable fee. I understand that some of the potential risks of receiving the Services via telemedicine include:  Delay or interruption in medical evaluation due to technological equipment failure or disruption;  Information transmitted may not be sufficient (e.g. poor resolution of images) to allow for appropriate medical decision making by the Practitioner; and/or  In rare instances, security protocols could fail, causing a breach of personal health information.  Furthermore, I acknowledge that it is my responsibility to provide  information about my medical history, conditions and care that is complete and accurate to the best of my ability. I acknowledge that Practitioner's advice, recommendations, and/or decision may be based on factors not within their control, such as incomplete or inaccurate data provided by me or distortions of diagnostic images or specimens that may result from electronic transmissions. I understand that the practice of medicine is not an exact science and that Practitioner makes no warranties or guarantees regarding treatment outcomes. I acknowledge that I will receive a copy of this consent concurrently upon execution via email to the email address I last provided but may also request a printed copy by calling the office of [facility].  I understand that my insurance will be billed for this visit.  I have read or had this consent read to me.  I understand the contents of this consent, which adequately explains the benefits and risks of the Services being provided via telemedicine.  I have been provided ample opportunity to ask questions regarding this consent and the Services and have had my questions answered to my satisfaction.  I give my informed consent for the services to be provided through the use of telemedicine in my medical care  By participating in this telemedicine visit I agree to the above YES

## 2019-05-27 ENCOUNTER — Telehealth (INDEPENDENT_AMBULATORY_CARE_PROVIDER_SITE_OTHER): Payer: Medicare HMO | Admitting: Internal Medicine

## 2019-05-27 ENCOUNTER — Other Ambulatory Visit: Payer: Self-pay

## 2019-05-27 ENCOUNTER — Inpatient Hospital Stay: Payer: Medicare HMO

## 2019-05-27 ENCOUNTER — Telehealth: Payer: Self-pay | Admitting: Internal Medicine

## 2019-05-27 VITALS — BP 132/66 | HR 89 | Temp 98.2°F | Resp 18

## 2019-05-27 DIAGNOSIS — Z720 Tobacco use: Secondary | ICD-10-CM | POA: Diagnosis not present

## 2019-05-27 DIAGNOSIS — C3491 Malignant neoplasm of unspecified part of right bronchus or lung: Secondary | ICD-10-CM

## 2019-05-27 DIAGNOSIS — B3781 Candidal esophagitis: Secondary | ICD-10-CM | POA: Diagnosis not present

## 2019-05-27 DIAGNOSIS — Z5189 Encounter for other specified aftercare: Secondary | ICD-10-CM | POA: Diagnosis not present

## 2019-05-27 DIAGNOSIS — B370 Candidal stomatitis: Secondary | ICD-10-CM | POA: Diagnosis not present

## 2019-05-27 DIAGNOSIS — I4892 Unspecified atrial flutter: Secondary | ICD-10-CM | POA: Diagnosis not present

## 2019-05-27 DIAGNOSIS — C3411 Malignant neoplasm of upper lobe, right bronchus or lung: Secondary | ICD-10-CM | POA: Diagnosis not present

## 2019-05-27 DIAGNOSIS — I480 Paroxysmal atrial fibrillation: Secondary | ICD-10-CM | POA: Diagnosis not present

## 2019-05-27 DIAGNOSIS — R635 Abnormal weight gain: Secondary | ICD-10-CM | POA: Diagnosis not present

## 2019-05-27 DIAGNOSIS — Z5111 Encounter for antineoplastic chemotherapy: Secondary | ICD-10-CM | POA: Diagnosis not present

## 2019-05-27 DIAGNOSIS — R110 Nausea: Secondary | ICD-10-CM | POA: Diagnosis not present

## 2019-05-27 MED ORDER — DEXAMETHASONE SODIUM PHOSPHATE 10 MG/ML IJ SOLN
INTRAMUSCULAR | Status: AC
  Filled 2019-05-27: qty 1

## 2019-05-27 MED ORDER — SODIUM CHLORIDE 0.9 % IV SOLN
Freq: Once | INTRAVENOUS | Status: AC
  Administered 2019-05-27: 15:00:00 via INTRAVENOUS
  Filled 2019-05-27: qty 250

## 2019-05-27 MED ORDER — POTASSIUM CHLORIDE ER 10 MEQ PO TBCR: 10.0000 meq | EXTENDED_RELEASE_TABLET | Freq: Two times a day (BID) | ORAL | 3 refills | Status: DC

## 2019-05-27 MED ORDER — DEXAMETHASONE SODIUM PHOSPHATE 10 MG/ML IJ SOLN
10.0000 mg | Freq: Once | INTRAMUSCULAR | Status: AC
  Administered 2019-05-27: 10 mg via INTRAVENOUS

## 2019-05-27 MED ORDER — FUROSEMIDE 40 MG PO TABS: 60.0000 mg | ORAL_TABLET | Freq: Two times a day (BID) | ORAL | 3 refills | Status: DC

## 2019-05-27 MED ORDER — SODIUM CHLORIDE 0.9 % IV SOLN
100.0000 mg/m2 | Freq: Once | INTRAVENOUS | Status: AC
  Administered 2019-05-27: 170 mg via INTRAVENOUS
  Filled 2019-05-27: qty 8.5

## 2019-05-27 NOTE — Patient Instructions (Addendum)
[facility] Discharge Instructions for Patients Receiving Chemotherapy  Today you received the following chemotherapy agents Etoposide.  To help prevent nausea and vomiting after your treatment, we encourage you to take your nausea medication as directed.  If you develop nausea and vomiting that is not controlled by your nausea medication, call the clinic.   BELOW ARE SYMPTOMS THAT SHOULD BE REPORTED IMMEDIATELY:  *FEVER GREATER THAN 100.5 F  *CHILLS WITH OR WITHOUT FEVER  NAUSEA AND VOMITING THAT IS NOT CONTROLLED WITH YOUR NAUSEA MEDICATION  *UNUSUAL SHORTNESS OF BREATH  *UNUSUAL BRUISING OR BLEEDING  TENDERNESS IN MOUTH AND THROAT WITH OR WITHOUT PRESENCE OF ULCERS  *URINARY PROBLEMS  *BOWEL PROBLEMS  UNUSUAL RASH Items with * indicate a potential emergency and should be followed up as soon as possible.  Feel free to call the clinic should you have any questions or concerns. The clinic phone number is [phone].  Please show the [facility] at check-in to the Emergency Department and triage nurse.

## 2019-05-27 NOTE — Addendum Note (Signed)
Addended by: Willeen Cass A on: 05/27/2019 05:16 PM   Modules accepted: Orders

## 2019-05-27 NOTE — Progress Notes (Signed)
Electrophysiology TeleHealth Note   Due to national recommendations of social distancing due to COVID 19, an audio/video telehealth visit is felt to be most appropriate for this patient at this time.  See MyChart message from today for the patient's consent to telehealth for [facility].   Date:  05/27/2019   ID:  Tonya Bradley, DOB Jul 07, 1951, MRN [id number]  Location: patient's home  Provider location: [address] [place]  Evaluation Performed: Follow-up visit  PCP:  [place], [name], MD  Cardiologist:  No primary care provider on file.  Electrophysiologist:  Dr Lovena Le  Chief Complaint:  "My weight is going up and I am swollen."  History of Present Illness:    Tonya Bradley is a 68 y.o. female who presents via audio/video conferencing for a telehealth visit today.  She is a pleasant 68 yo woman with a h/o small cell lung CA, diastolic heart failure, CAD, and atrial flutter, s/p ablation who then developed atrial fib. She had been on low dose amiodarone and maintaining NSR. She has gained weight over the past 2 weeks on lasix 60 mg daily.  Since last being seen in our clinic, the patient reports swelling.  The patient denies symptoms of fevers, chills, cough, or new SOB worrisome for COVID 19.  Past Medical History:  Diagnosis Date  . Anemia   . Anxiety   . Arthritis    "back"  . Atrial flutter ([place]) 03/13/2016  . CAD (coronary artery disease)   . Cancer ([place])    skin cancer  . Cataract   . Colon polyp   . Complication of anesthesia    BP dropped during esophagus diltation in 2017-admitted for 3 days.   Marland Kitchen COPD (chronic obstructive pulmonary disease) ([place])   . Depression   . Dysrhythmia   . Emphysema of lung ([place])   . GERD (gastroesophageal reflux disease)   . Hypercholesterolemia   . Lung cancer ([place])   . Osteoporosis   . Oxygen deficiency     Past Surgical History:  Procedure Laterality Date  . A-FLUTTER ABLATION N/A 05/19/2017   Procedure:  A-Flutter Ablation;  Surgeon: Evans Lance, MD;  Location: [facility];  Service: Cardiovascular;  Laterality: N/A;  . APPENDECTOMY    . BASAL CELL CARCINOMA EXCISION    . CARDIAC CATHETERIZATION    . CATARACT EXTRACTION Bilateral   . CHOLECYSTECTOMY    . COLONOSCOPY N/A 06/23/2014   Dr. Rourk:Rectal and colonic polyps-removed, Pancolonic diverticulosis. lymphocytic colitis  . CORONARY ANGIOPLASTY WITH STENT PLACEMENT    . ESOPHAGEAL DILATION N/A 03/13/2016   Procedure: ESOPHAGEAL DILATION;  Surgeon: Daneil Dolin, MD;  [facility];  Service: Endoscopy;  Laterality: N/A;  . ESOPHAGOGASTRODUODENOSCOPY N/A 03/13/2016   Procedure: ESOPHAGOGASTRODUODENOSCOPY (EGD);  Surgeon: Daneil Dolin, MD;  [facility];  Service: Endoscopy;  Laterality: N/A;  0930  . EYE SURGERY     cataract removed, bilateral  . IR THORACENTESIS ASP PLEURAL SPACE W/IMG GUIDE  02/15/2019  . LUMBAR LAMINECTOMY/DECOMPRESSION MICRODISCECTOMY N/A 01/19/2018   Procedure: L4-5 DECOMPRESSION;  Surgeon: Marybelle Killings, MD;  Location: [facility];  Service: Orthopedics;  Laterality: N/A;  . VIDEO ASSISTED THORACOSCOPY (VATS)/ LOBECTOMY Right 02/04/2019   Procedure: VIDEO ASSISTED THORACOSCOPY (VATS)/RIGHT UPPER AND MIDDLE LOBECTOMY;  Surgeon: Melrose Nakayama, MD;  Location: [facility];  Service: Thoracic;  Laterality: Right;  Marland Kitchen VIDEO BRONCHOSCOPY N/A 02/14/2019   Procedure: BRONCHOSCOPY WITH SEDATION;  Surgeon: [name],  [name], MD;  Location: [facility];  Service: Thoracic;  Laterality: N/A;    Current Outpatient Medications  Medication Sig Dispense Refill  . ALPRAZolam (XANAX) 0.5 MG tablet Take 1 tablet (0.5 mg total) by mouth 2 (two) times daily. 60 tablet 2  . amiodarone (PACERONE) 200 MG tablet TAKE 1/2 TABLET BY MOUTH ONCE DAILY 90 tablet 3  . atorvastatin (LIPITOR) 80 MG tablet TAKE 1 TABLET(80 MG) BY MOUTH DAILY 90 tablet 1  . Cholecalciferol (VITAMIN D) 2000 units CAPS Take 2,000 Units by mouth  daily.    . cycloSPORINE (RESTASIS) 0.05 % ophthalmic emulsion Place 1 drop into both eyes 2 (two) times daily.    Marland Kitchen dexamethasone (DECADRON) 4 MG tablet TAKE 1 TABLET(4 MG) BY MOUTH TWICE DAILY FOR 5 DAYS AFTER CHEMOTHERAPY 30 tablet 0  . Dexlansoprazole (DEXILANT) 30 MG capsule Take 1 capsule (30 mg total) by mouth daily. 90 capsule 3  . diltiazem (CARDIZEM CD) 120 MG 24 hr capsule TAKE 1 CAPSULE(120 MG) BY MOUTH DAILY (Patient taking differently: Take 120 mg by mouth daily. ) 90 capsule 2  . DULoxetine (CYMBALTA) 60 MG capsule Take 1 capsule (60 mg total) by mouth daily. (Patient taking differently: Take 60 mg by mouth at bedtime. ) 90 capsule 1  . fluconazole (DIFLUCAN) 100 MG tablet Take 1 tablet (100 mg total) by mouth daily. (Patient not taking: Reported on 05/26/2019) 10 tablet 0  . furosemide (LASIX) 20 MG tablet TAKE 1 TABLET BY MOUTH THREE TIMES DAILY 270 tablet 0  . guaiFENesin (MUCINEX) 600 MG 12 hr tablet Take 2 tablets (1,200 mg total) by mouth 2 (two) times daily. For 7 days, then use as needed 60 tablet 1  . LINZESS 290 MCG CAPS capsule TAKE 1 CAPSULE(290 MCG) BY MOUTH DAILY AS NEEDED FOR CONSTIPATION 30 capsule 0  . loratadine (CLARITIN) 10 MG tablet Take 10 mg by mouth daily.    . magnesium oxide (MAG-OX) 400 (241.3 Mg) MG tablet 1 tablet twice daily 60 tablet 1  . metoCLOPramide (REGLAN) 5 MG tablet Take 1 tablet (5 mg total) by mouth 3 (three) times daily before meals. 90 tablet 2  . metoprolol tartrate (LOPRESSOR) 25 MG tablet Take 1 tablet (25 mg total) by mouth 2 (two) times daily. 60 tablet 3  . Multiple Vitamin (MULTIVITAMIN WITH MINERALS) TABS tablet Take 1 tablet by mouth daily.    . nitroGLYCERIN (NITROSTAT) 0.4 MG SL tablet Place 1 tablet (0.4 mg total) under the tongue every 5 (five) minutes as needed for chest pain. Reported on 06/12/2016 (Patient not taking: Reported on 05/26/2019) 30 tablet 2  . ondansetron (ZOFRAN) 4 MG tablet TAKE 1 TABLET(4 MG) BY MOUTH TWICE DAILY  AS NEEDED FOR NAUSEA OR VOMITING 40 tablet 0  . potassium chloride (K-DUR) 10 MEQ tablet TAKE 1 TABLET(10 MEQ) BY MOUTH DAILY (Patient taking differently: Take 10 mEq by mouth daily. ) 90 tablet 2  . prochlorperazine (COMPAZINE) 10 MG tablet TAKE 1 TABLET(10 MG) BY MOUTH EVERY 6 HOURS AS NEEDED FOR NAUSEA OR VOMITING 30 tablet 0  . QUEtiapine (SEROQUEL) 100 MG tablet TAKE 1 TABLET(100 MG) BY MOUTH AT BEDTIME 90 tablet 0  . rOPINIRole (REQUIP) 0.25 MG tablet TAKE 2 TABLETS(0.5 MG) BY MOUTH AT BEDTIME (Patient taking differently: Take 0.5 mg by mouth at bedtime. ) 180 tablet 0  . TRELEGY ELLIPTA 100-62.5-25 MCG/INH AEPB INHALE 1 PUFF INTO THE LUNGS DAILY (Patient taking differently: Inhale 1 puff into the lungs daily. ) 60 each  3  . VENTOLIN HFA 108 (90 Base) MCG/ACT inhaler INHALE 2 PUFFS INTO THE LUNGS EVERY 6 HOURS AS NEEDED FOR WHEEZING OR SHORTNESS OF BREATH (Patient taking differently: Inhale 2 puffs into the lungs every 6 (six) hours as needed (wheezing/shortness of breath). ) 18 g 0   No current facility-administered medications for this visit.     Allergies:   Morphine and related   Social History:  The patient  reports that she has been smoking cigarettes. She started smoking about 52 years ago. She has a 13.50 pack-year smoking history. She has never used smokeless tobacco. She reports that she does not drink alcohol or use drugs.   Family History:  The patient's  family history includes Breast cancer (age of onset: 65) in her sister; Cancer in her cousin; Congestive Heart Failure in her brother; Heart attack in her father and sister; Heart disease in her brother, father, maternal grandfather, mother, and sister; Hyperlipidemia in her mother; Hypertension in her son and son; Stomach cancer in an other family member; Stroke in her mother.   ROS:  Please see the history of present illness.   All other systems are personally reviewed and negative.    Exam:    Vital Signs:  BP - 118/68, P  - 60   Labs/Other Tests and Data Reviewed:    Recent Labs: 01/02/2019: TSH 0.039 05/26/2019: ALT 26; BUN 17; Creatinine 0.67; Hemoglobin 8.6; Magnesium 2.1; Platelet Count 326; Potassium 3.3; Sodium 140   Wt Readings from Last 3 Encounters:  05/26/19 144 lb 11.2 oz (65.6 kg)  05/11/19 129 lb 13.6 oz (58.9 kg)  05/10/19 129 lb 12.8 oz (58.9 kg)     Other studies personally reviewed:   ASSESSMENT & PLAN:    1.  Weight gain - I have recommended she double up her lasix to 60 mg twice daily. 2. Hypokalemia - I have asked her to take her potassium twice daily. 3. HTN - her pressure has been controlled. 4. Atrial fib - she is maintaining NSR on low dose amiodarone. 5. COVID 19 screen The patient denies symptoms of COVID 19 at this time.  The importance of social distancing was discussed today.  Follow-up:  3-4 months Next remote: n/a  Current medicines are reviewed at length with the patient today.   The patient does not have concerns regarding her medicines.  The following changes were made today:  none  Labs/ tests ordered today include: none No orders of the defined types were placed in this encounter.    Patient Risk:  after full review of this patients clinical status, I feel that they are at moderate risk at this time.  Today, I have spent 25 minutes with the patient with telehealth technology discussing all of the above .    Signed, Cristopher Peru, MD  05/27/2019 11:30 AM     [facility] [address] [place]  [place] [address] [phone] (office) [phone] (fax)

## 2019-05-27 NOTE — Telephone Encounter (Signed)
Pt calling stating that she had a visit today with Dr. Lovena Le and he increased her Furosemide and potassium, but these medications were not sent to her pharmacy [facility]. Please address

## 2019-05-28 ENCOUNTER — Ambulatory Visit: Payer: Medicare HMO

## 2019-05-28 ENCOUNTER — Other Ambulatory Visit: Payer: Self-pay

## 2019-05-28 ENCOUNTER — Inpatient Hospital Stay: Payer: Medicare HMO

## 2019-05-28 VITALS — BP 144/77 | HR 78 | Temp 99.1°F | Resp 20

## 2019-05-28 DIAGNOSIS — Z720 Tobacco use: Secondary | ICD-10-CM | POA: Diagnosis not present

## 2019-05-28 DIAGNOSIS — C3491 Malignant neoplasm of unspecified part of right bronchus or lung: Secondary | ICD-10-CM

## 2019-05-28 DIAGNOSIS — B3781 Candidal esophagitis: Secondary | ICD-10-CM | POA: Diagnosis not present

## 2019-05-28 DIAGNOSIS — Z5111 Encounter for antineoplastic chemotherapy: Secondary | ICD-10-CM | POA: Diagnosis not present

## 2019-05-28 DIAGNOSIS — B370 Candidal stomatitis: Secondary | ICD-10-CM | POA: Diagnosis not present

## 2019-05-28 DIAGNOSIS — R110 Nausea: Secondary | ICD-10-CM | POA: Diagnosis not present

## 2019-05-28 DIAGNOSIS — C3411 Malignant neoplasm of upper lobe, right bronchus or lung: Secondary | ICD-10-CM | POA: Diagnosis not present

## 2019-05-28 DIAGNOSIS — Z5189 Encounter for other specified aftercare: Secondary | ICD-10-CM | POA: Diagnosis not present

## 2019-05-28 DIAGNOSIS — I4892 Unspecified atrial flutter: Secondary | ICD-10-CM | POA: Diagnosis not present

## 2019-05-28 DIAGNOSIS — R635 Abnormal weight gain: Secondary | ICD-10-CM | POA: Diagnosis not present

## 2019-05-28 MED ORDER — DEXAMETHASONE SODIUM PHOSPHATE 10 MG/ML IJ SOLN
10.0000 mg | Freq: Once | INTRAMUSCULAR | Status: AC
  Administered 2019-05-28: 10 mg via INTRAVENOUS

## 2019-05-28 MED ORDER — SODIUM CHLORIDE 0.9 % IV SOLN
Freq: Once | INTRAVENOUS | Status: AC
  Administered 2019-05-28: 09:00:00 via INTRAVENOUS
  Filled 2019-05-28: qty 250

## 2019-05-28 MED ORDER — SODIUM CHLORIDE 0.9 % IV SOLN
100.0000 mg/m2 | Freq: Once | INTRAVENOUS | Status: AC
  Administered 2019-05-28: 170 mg via INTRAVENOUS
  Filled 2019-05-28: qty 8.5

## 2019-05-28 MED ORDER — DEXAMETHASONE SODIUM PHOSPHATE 10 MG/ML IJ SOLN
INTRAMUSCULAR | Status: AC
  Filled 2019-05-28: qty 1

## 2019-05-28 NOTE — Patient Instructions (Signed)
[facility] Discharge Instructions for Patients Receiving Chemotherapy  Today you received the following chemotherapy agents: Etoposide (Vepesid, VP-16)  To help prevent nausea and vomiting after your treatment, we encourage you to take your nausea medication as directed.   If you develop nausea and vomiting that is not controlled by your nausea medication, call the clinic.   BELOW ARE SYMPTOMS THAT SHOULD BE REPORTED IMMEDIATELY:  *FEVER GREATER THAN 100.5 F  *CHILLS WITH OR WITHOUT FEVER  NAUSEA AND VOMITING THAT IS NOT CONTROLLED WITH YOUR NAUSEA MEDICATION  *UNUSUAL SHORTNESS OF BREATH  *UNUSUAL BRUISING OR BLEEDING  TENDERNESS IN MOUTH AND THROAT WITH OR WITHOUT PRESENCE OF ULCERS  *URINARY PROBLEMS  *BOWEL PROBLEMS  UNUSUAL RASH Items with * indicate a potential emergency and should be followed up as soon as possible.  Feel free to call the clinic should you have any questions or concerns. The clinic phone number is [phone].  Please show the [facility] at check-in to the Emergency Department and triage nurse.  Coronavirus (COVID-19) Are you at risk?  Are you at risk for the Coronavirus (COVID-19)?  To be considered HIGH RISK for Coronavirus (COVID-19), you have to meet the following criteria:  . Traveled to [place], [place], [place], [place] or [place]; or in the [place] to [place], [place], [place], or [place]; and have fever, cough, and shortness of breath within the last 2 weeks of travel OR . Been in close contact with a person diagnosed with COVID-19 within the last 2 weeks and have fever, cough, and shortness of breath . IF YOU DO NOT MEET THESE CRITERIA, YOU ARE CONSIDERED LOW RISK FOR COVID-19.  What to do if you are HIGH RISK for COVID-19?  Marland Kitchen If you are having a medical emergency, call 911. . Seek medical care right away. Before you go to a doctor's office, urgent care or emergency department, call ahead and tell  them about your recent travel, contact with someone diagnosed with COVID-19, and your symptoms. You should receive instructions from your physician's office regarding next steps of care.  . When you arrive at healthcare provider, tell the healthcare staff immediately you have returned from visiting [place], [place], [place], [place] or [place]; or traveled in the [place] to [place], [place], [place], or [place]; in the last two weeks or you have been in close contact with a person diagnosed with COVID-19 in the last 2 weeks.   . Tell the health care staff about your symptoms: fever, cough and shortness of breath. . After you have been seen by a medical provider, you will be either: o Tested for (COVID-19) and discharged home on quarantine except to seek medical care if symptoms worsen, and asked to  - Stay home and avoid contact with others until you get your results (4-5 days)  - Avoid travel on public transportation if possible (such as bus, train, or airplane) or o Sent to the Emergency Department by EMS for evaluation, COVID-19 testing, and possible admission depending on your condition and test results.  What to do if you are LOW RISK for COVID-19?  Reduce your risk of any infection by using the same precautions used for avoiding the common cold or flu:  Marland Kitchen Wash your hands often with soap and warm water for at least 20 seconds.  If soap and water are not readily available, use an alcohol-based hand sanitizer with at least 60% alcohol.  . If coughing  or sneezing, cover your mouth and nose by coughing or sneezing into the elbow areas of your shirt or coat, into a tissue or into your sleeve (not your hands). . Avoid shaking hands with others and consider head nods or verbal greetings only. . Avoid touching your eyes, nose, or mouth with unwashed hands.  . Avoid close contact with people who are sick. . Avoid places or events with large numbers of people in one location, like concerts or  sporting events. . Carefully consider travel plans you have or are making. . If you are planning any travel outside or inside the [place], visit the CDC's Travelers' Health webpage for the latest health notices. . If you have some symptoms but not all symptoms, continue to monitor at home and seek medical attention if your symptoms worsen. . If you are having a medical emergency, call 911.   [facility] / e-Visit: [email or web address]         [facility]: [facility]: [phone]                   [facility]: [phone]

## 2019-05-29 ENCOUNTER — Inpatient Hospital Stay: Payer: Medicare HMO

## 2019-05-29 NOTE — Patient Instructions (Signed)

## 2019-06-01 ENCOUNTER — Inpatient Hospital Stay: Payer: Medicare HMO

## 2019-06-01 ENCOUNTER — Other Ambulatory Visit: Payer: Self-pay

## 2019-06-01 DIAGNOSIS — Z720 Tobacco use: Secondary | ICD-10-CM | POA: Diagnosis not present

## 2019-06-01 DIAGNOSIS — Z5189 Encounter for other specified aftercare: Secondary | ICD-10-CM | POA: Diagnosis not present

## 2019-06-01 DIAGNOSIS — Z5111 Encounter for antineoplastic chemotherapy: Secondary | ICD-10-CM | POA: Diagnosis not present

## 2019-06-01 DIAGNOSIS — B370 Candidal stomatitis: Secondary | ICD-10-CM | POA: Diagnosis not present

## 2019-06-01 DIAGNOSIS — R635 Abnormal weight gain: Secondary | ICD-10-CM | POA: Diagnosis not present

## 2019-06-01 DIAGNOSIS — C3491 Malignant neoplasm of unspecified part of right bronchus or lung: Secondary | ICD-10-CM

## 2019-06-01 DIAGNOSIS — B3781 Candidal esophagitis: Secondary | ICD-10-CM | POA: Diagnosis not present

## 2019-06-01 DIAGNOSIS — R110 Nausea: Secondary | ICD-10-CM | POA: Diagnosis not present

## 2019-06-01 DIAGNOSIS — C3411 Malignant neoplasm of upper lobe, right bronchus or lung: Secondary | ICD-10-CM | POA: Diagnosis not present

## 2019-06-01 DIAGNOSIS — I4892 Unspecified atrial flutter: Secondary | ICD-10-CM | POA: Diagnosis not present

## 2019-06-01 LAB — CBC WITH DIFFERENTIAL (CANCER CENTER ONLY)
Abs Immature Granulocytes: 0 10*3/uL (ref 0.00–0.07)
Basophils Absolute: 0 10*3/uL (ref 0.0–0.1)
Basophils Relative: 0 %
Eosinophils Absolute: 0 10*3/uL (ref 0.0–0.5)
Eosinophils Relative: 0 %
HCT: 32.1 % — ABNORMAL LOW (ref 36.0–46.0)
Hemoglobin: 10.6 g/dL — ABNORMAL LOW (ref 12.0–15.0)
Lymphocytes Relative: 5 %
Lymphs Abs: 3 10*3/uL (ref 0.7–4.0)
MCH: 29.3 pg (ref 26.0–34.0)
MCHC: 33 g/dL (ref 30.0–36.0)
MCV: 88.7 fL (ref 80.0–100.0)
Monocytes Absolute: 0 10*3/uL — ABNORMAL LOW (ref 0.1–1.0)
Monocytes Relative: 0 %
Neutro Abs: 57 10*3/uL — ABNORMAL HIGH (ref 1.7–17.7)
Neutrophils Relative %: 95 %
Platelet Count: 208 10*3/uL (ref 150–400)
RBC: 3.62 MIL/uL — ABNORMAL LOW (ref 3.87–5.11)
RDW: 18.4 % — ABNORMAL HIGH (ref 11.5–15.5)
WBC Count: 60 10*3/uL (ref 4.0–10.5)
nRBC: 0 % (ref 0.0–0.2)

## 2019-06-01 LAB — CMP (CANCER CENTER ONLY)
ALT: 22 U/L (ref 0–44)
AST: 15 U/L (ref 15–41)
Albumin: 3.6 g/dL (ref 3.5–5.0)
Alkaline Phosphatase: 177 U/L — ABNORMAL HIGH (ref 38–126)
Anion gap: 16 — ABNORMAL HIGH (ref 5–15)
BUN: 38 mg/dL — ABNORMAL HIGH (ref 8–23)
CO2: 35 mmol/L — ABNORMAL HIGH (ref 22–32)
Calcium: 9.1 mg/dL (ref 8.9–10.3)
Chloride: 87 mmol/L — ABNORMAL LOW (ref 98–111)
Creatinine: 0.96 mg/dL (ref 0.44–1.00)
GFR, Est AFR Am: >60 mL/min (ref >60)
GFR, Estimated: >60 mL/min (ref >60)
Glucose, Bld: 166 mg/dL — ABNORMAL HIGH (ref 70–99)
Potassium: 3.3 mmol/L — ABNORMAL LOW (ref 3.5–5.1)
Sodium: 138 mmol/L (ref 135–145)
Total Bilirubin: 0.3 mg/dL (ref 0.3–1.2)
Total Protein: 6.7 g/dL (ref 6.5–8.1)

## 2019-06-01 LAB — MAGNESIUM: Magnesium: 1.9 mg/dL (ref 1.7–2.4)

## 2019-06-07 ENCOUNTER — Telehealth: Payer: Self-pay | Admitting: Internal Medicine

## 2019-06-07 ENCOUNTER — Telehealth: Payer: Self-pay | Admitting: Medical Oncology

## 2019-06-07 DIAGNOSIS — J449 Chronic obstructive pulmonary disease, unspecified: Secondary | ICD-10-CM | POA: Diagnosis not present

## 2019-06-07 NOTE — Telephone Encounter (Signed)
Returned pt message from this am -she is weak , no energy and wants a b12 injection.  I lvm to call me back .

## 2019-06-07 NOTE — Telephone Encounter (Signed)
Called pt per 7/06 sch message - unable to reach pt. Left message for patient to call back to reschedule.

## 2019-06-07 NOTE — Telephone Encounter (Signed)
I called pt back and told her he will evaluate her energy levels when she gets labs on wed.

## 2019-06-08 ENCOUNTER — Inpatient Hospital Stay: Payer: Medicare HMO

## 2019-06-09 ENCOUNTER — Inpatient Hospital Stay: Payer: Medicare HMO | Attending: Internal Medicine

## 2019-06-09 ENCOUNTER — Telehealth: Payer: Self-pay | Admitting: *Deleted

## 2019-06-09 ENCOUNTER — Other Ambulatory Visit: Payer: Self-pay

## 2019-06-09 DIAGNOSIS — I5033 Acute on chronic diastolic (congestive) heart failure: Secondary | ICD-10-CM | POA: Diagnosis present

## 2019-06-09 DIAGNOSIS — I5031 Acute diastolic (congestive) heart failure: Secondary | ICD-10-CM | POA: Diagnosis not present

## 2019-06-09 DIAGNOSIS — F419 Anxiety disorder, unspecified: Secondary | ICD-10-CM | POA: Diagnosis present

## 2019-06-09 DIAGNOSIS — C3491 Malignant neoplasm of unspecified part of right bronchus or lung: Secondary | ICD-10-CM | POA: Diagnosis present

## 2019-06-09 DIAGNOSIS — J9621 Acute and chronic respiratory failure with hypoxia: Secondary | ICD-10-CM | POA: Diagnosis present

## 2019-06-09 DIAGNOSIS — I483 Typical atrial flutter: Secondary | ICD-10-CM | POA: Diagnosis not present

## 2019-06-09 DIAGNOSIS — R579 Shock, unspecified: Secondary | ICD-10-CM | POA: Diagnosis not present

## 2019-06-09 DIAGNOSIS — R710 Precipitous drop in hematocrit: Secondary | ICD-10-CM | POA: Diagnosis not present

## 2019-06-09 DIAGNOSIS — I5043 Acute on chronic combined systolic (congestive) and diastolic (congestive) heart failure: Secondary | ICD-10-CM | POA: Diagnosis not present

## 2019-06-09 DIAGNOSIS — J439 Emphysema, unspecified: Secondary | ICD-10-CM | POA: Diagnosis present

## 2019-06-09 DIAGNOSIS — C3411 Malignant neoplasm of upper lobe, right bronchus or lung: Secondary | ICD-10-CM | POA: Insufficient documentation

## 2019-06-09 DIAGNOSIS — I2510 Atherosclerotic heart disease of native coronary artery without angina pectoris: Secondary | ICD-10-CM | POA: Diagnosis present

## 2019-06-09 DIAGNOSIS — N179 Acute kidney failure, unspecified: Secondary | ICD-10-CM | POA: Diagnosis present

## 2019-06-09 DIAGNOSIS — R079 Chest pain, unspecified: Secondary | ICD-10-CM | POA: Diagnosis not present

## 2019-06-09 DIAGNOSIS — I471 Supraventricular tachycardia: Secondary | ICD-10-CM | POA: Diagnosis present

## 2019-06-09 DIAGNOSIS — E782 Mixed hyperlipidemia: Secondary | ICD-10-CM | POA: Diagnosis not present

## 2019-06-09 DIAGNOSIS — Z9221 Personal history of antineoplastic chemotherapy: Secondary | ICD-10-CM | POA: Diagnosis not present

## 2019-06-09 DIAGNOSIS — Z720 Tobacco use: Secondary | ICD-10-CM | POA: Diagnosis not present

## 2019-06-09 DIAGNOSIS — I5023 Acute on chronic systolic (congestive) heart failure: Secondary | ICD-10-CM | POA: Diagnosis not present

## 2019-06-09 DIAGNOSIS — F17210 Nicotine dependence, cigarettes, uncomplicated: Secondary | ICD-10-CM | POA: Diagnosis present

## 2019-06-09 DIAGNOSIS — Z515 Encounter for palliative care: Secondary | ICD-10-CM | POA: Diagnosis not present

## 2019-06-09 DIAGNOSIS — Z7189 Other specified counseling: Secondary | ICD-10-CM | POA: Diagnosis not present

## 2019-06-09 DIAGNOSIS — E785 Hyperlipidemia, unspecified: Secondary | ICD-10-CM | POA: Diagnosis present

## 2019-06-09 DIAGNOSIS — R531 Weakness: Secondary | ICD-10-CM | POA: Diagnosis present

## 2019-06-09 DIAGNOSIS — R609 Edema, unspecified: Secondary | ICD-10-CM | POA: Diagnosis not present

## 2019-06-09 DIAGNOSIS — I959 Hypotension, unspecified: Secondary | ICD-10-CM | POA: Diagnosis not present

## 2019-06-09 DIAGNOSIS — J9811 Atelectasis: Secondary | ICD-10-CM | POA: Diagnosis present

## 2019-06-09 DIAGNOSIS — I82409 Acute embolism and thrombosis of unspecified deep veins of unspecified lower extremity: Secondary | ICD-10-CM | POA: Diagnosis not present

## 2019-06-09 DIAGNOSIS — K219 Gastro-esophageal reflux disease without esophagitis: Secondary | ICD-10-CM | POA: Diagnosis present

## 2019-06-09 DIAGNOSIS — E876 Hypokalemia: Secondary | ICD-10-CM | POA: Diagnosis not present

## 2019-06-09 DIAGNOSIS — M542 Cervicalgia: Secondary | ICD-10-CM | POA: Insufficient documentation

## 2019-06-09 DIAGNOSIS — J9601 Acute respiratory failure with hypoxia: Secondary | ICD-10-CM | POA: Diagnosis not present

## 2019-06-09 DIAGNOSIS — J9611 Chronic respiratory failure with hypoxia: Secondary | ICD-10-CM | POA: Diagnosis not present

## 2019-06-09 DIAGNOSIS — R0602 Shortness of breath: Secondary | ICD-10-CM | POA: Diagnosis not present

## 2019-06-09 DIAGNOSIS — R5383 Other fatigue: Secondary | ICD-10-CM | POA: Diagnosis not present

## 2019-06-09 DIAGNOSIS — I110 Hypertensive heart disease with heart failure: Secondary | ICD-10-CM | POA: Diagnosis present

## 2019-06-09 DIAGNOSIS — D630 Anemia in neoplastic disease: Secondary | ICD-10-CM | POA: Diagnosis present

## 2019-06-09 DIAGNOSIS — R0902 Hypoxemia: Secondary | ICD-10-CM | POA: Diagnosis not present

## 2019-06-09 DIAGNOSIS — Z85118 Personal history of other malignant neoplasm of bronchus and lung: Secondary | ICD-10-CM | POA: Diagnosis not present

## 2019-06-09 DIAGNOSIS — R001 Bradycardia, unspecified: Secondary | ICD-10-CM | POA: Diagnosis not present

## 2019-06-09 DIAGNOSIS — F329 Major depressive disorder, single episode, unspecified: Secondary | ICD-10-CM | POA: Diagnosis present

## 2019-06-09 DIAGNOSIS — D696 Thrombocytopenia, unspecified: Secondary | ICD-10-CM | POA: Diagnosis present

## 2019-06-09 DIAGNOSIS — Z66 Do not resuscitate: Secondary | ICD-10-CM | POA: Diagnosis present

## 2019-06-09 DIAGNOSIS — A419 Sepsis, unspecified organism: Secondary | ICD-10-CM | POA: Diagnosis present

## 2019-06-09 DIAGNOSIS — I509 Heart failure, unspecified: Secondary | ICD-10-CM | POA: Insufficient documentation

## 2019-06-09 DIAGNOSIS — I4892 Unspecified atrial flutter: Secondary | ICD-10-CM | POA: Diagnosis present

## 2019-06-09 DIAGNOSIS — S299XXA Unspecified injury of thorax, initial encounter: Secondary | ICD-10-CM | POA: Diagnosis not present

## 2019-06-09 DIAGNOSIS — Z20828 Contact with and (suspected) exposure to other viral communicable diseases: Secondary | ICD-10-CM | POA: Diagnosis present

## 2019-06-09 LAB — CBC WITH DIFFERENTIAL (CANCER CENTER ONLY)
Abs Immature Granulocytes: 2.06 10*3/uL — ABNORMAL HIGH (ref 0.00–0.07)
Basophils Absolute: 0.1 10*3/uL (ref 0.0–0.1)
Basophils Relative: 1 %
Eosinophils Absolute: 0 10*3/uL (ref 0.0–0.5)
Eosinophils Relative: 0 %
HCT: 25.8 % — ABNORMAL LOW (ref 36.0–46.0)
Hemoglobin: 8.2 g/dL — ABNORMAL LOW (ref 12.0–15.0)
Immature Granulocytes: 13 %
Lymphocytes Relative: 19 %
Lymphs Abs: 2.9 10*3/uL (ref 0.7–4.0)
MCH: 28.2 pg (ref 26.0–34.0)
MCHC: 31.8 g/dL (ref 30.0–36.0)
MCV: 88.7 fL (ref 80.0–100.0)
Monocytes Absolute: 1.2 10*3/uL — ABNORMAL HIGH (ref 0.1–1.0)
Monocytes Relative: 8 %
Neutro Abs: 9.3 10*3/uL — ABNORMAL HIGH (ref 1.7–7.7)
Neutrophils Relative %: 59 %
Platelet Count: 63 10*3/uL — ABNORMAL LOW (ref 150–400)
RBC: 2.91 MIL/uL — ABNORMAL LOW (ref 3.87–5.11)
RDW: 17.9 % — ABNORMAL HIGH (ref 11.5–15.5)
WBC Count: 15.7 10*3/uL — ABNORMAL HIGH (ref 4.0–10.5)
nRBC: 0.7 % — ABNORMAL HIGH (ref 0.0–0.2)

## 2019-06-09 LAB — CMP (CANCER CENTER ONLY)
ALT: 22 U/L (ref 0–44)
AST: 22 U/L (ref 15–41)
Albumin: 3.1 g/dL — ABNORMAL LOW (ref 3.5–5.0)
Alkaline Phosphatase: 187 U/L — ABNORMAL HIGH (ref 38–126)
Anion gap: 11 (ref 5–15)
BUN: 18 mg/dL (ref 8–23)
CO2: 38 mmol/L — ABNORMAL HIGH (ref 22–32)
Calcium: 9 mg/dL (ref 8.9–10.3)
Chloride: 91 mmol/L — ABNORMAL LOW (ref 98–111)
Creatinine: 0.93 mg/dL (ref 0.44–1.00)
GFR, Est AFR Am: >60 mL/min (ref >60)
GFR, Estimated: >60 mL/min (ref >60)
Glucose, Bld: 143 mg/dL — ABNORMAL HIGH (ref 70–99)
Potassium: 2.7 mmol/L — CL (ref 3.5–5.1)
Sodium: 140 mmol/L (ref 135–145)
Total Bilirubin: 0.2 mg/dL — ABNORMAL LOW (ref 0.3–1.2)
Total Protein: 6.1 g/dL — ABNORMAL LOW (ref 6.5–8.1)

## 2019-06-09 LAB — MAGNESIUM: Magnesium: 1.5 mg/dL — ABNORMAL LOW (ref 1.7–2.4)

## 2019-06-09 MED ORDER — POTASSIUM CHLORIDE CRYS ER 20 MEQ PO TBCR: 20.0000 meq | EXTENDED_RELEASE_TABLET | Freq: Two times a day (BID) | ORAL | 0 refills | Status: DC

## 2019-06-09 NOTE — Telephone Encounter (Signed)
I instructed pt to go to ED to evaluate K+ and to take her KDUR and lasix pills with her. [place] prescription sent to pharmacy. Left message for pt to pick up rx.

## 2019-06-09 NOTE — Telephone Encounter (Addendum)
Low potassium- Pt states taking kdur 28meq bid. States taking lasix  " 4 tablets/ day" She cannot tell me strength.

## 2019-06-09 NOTE — Telephone Encounter (Signed)
Received call report from [facility].  "Today's K+ = 2.7."  Message left for collaborative nurse with results.

## 2019-06-10 ENCOUNTER — Telehealth: Payer: Self-pay | Admitting: Medical Oncology

## 2019-06-10 ENCOUNTER — Other Ambulatory Visit: Payer: Self-pay | Admitting: Family Medicine

## 2019-06-10 NOTE — Telephone Encounter (Signed)
Pt did not go to ED. She has kdur 10 meq tablets at home . I instructed her per Dr Julien Nordmann to take 2 in am and 2 in pm for the next 10 days.  Pt verbalized back to me the correct instructions.  I called [facility] and cancelled kdur 20 meq rx.

## 2019-06-11 ENCOUNTER — Telehealth: Payer: Self-pay | Admitting: *Deleted

## 2019-06-11 NOTE — Telephone Encounter (Signed)
Received call from patient .She states that her neck has been hurting since lunchtime yesterday and wants to know if Dr. Julien Nordmann can give her soemthing stronger than Ibuprofen for pain.  Advised that pt would need to be seen before any pain medication is prescribed for her. She states she has been taking 3 OTC Ibuprofen every 4 -6 hours without relief. Advised to use Ibuprofen every 6 -8 hours, not every 4 and that she could alternate with Tylenol as needed.Advised to use heating pad on low. Pt states she has a lot to do today and can't just lay down with heating pad, though that has made her feel a little better.  Advised to contact her PCP or go to Urgent Care for neck pain evaluation.  Pt had cervical spine CT done in May and had no abnormalities at that time. Pt states she does not remember that-advised that it was done when she went to ED after a fall/dizziness. Pt voiced understanding to the above advice.

## 2019-06-12 ENCOUNTER — Inpatient Hospital Stay (HOSPITAL_COMMUNITY)
Admission: EM | Admit: 2019-06-12 | Discharge: 2019-06-19 | DRG: 291 | Disposition: A | Discharge status: Home Health | Payer: Medicare HMO | Attending: Internal Medicine | Admitting: Internal Medicine

## 2019-06-12 ENCOUNTER — Emergency Department (HOSPITAL_COMMUNITY): Payer: Medicare HMO

## 2019-06-12 ENCOUNTER — Other Ambulatory Visit: Payer: Self-pay

## 2019-06-12 ENCOUNTER — Inpatient Hospital Stay (HOSPITAL_COMMUNITY): Payer: Medicare HMO

## 2019-06-12 ENCOUNTER — Encounter (HOSPITAL_COMMUNITY): Payer: Self-pay

## 2019-06-12 DIAGNOSIS — M810 Age-related osteoporosis without current pathological fracture: Secondary | ICD-10-CM | POA: Diagnosis present

## 2019-06-12 DIAGNOSIS — Z720 Tobacco use: Secondary | ICD-10-CM | POA: Diagnosis present

## 2019-06-12 DIAGNOSIS — N179 Acute kidney failure, unspecified: Secondary | ICD-10-CM | POA: Diagnosis present

## 2019-06-12 DIAGNOSIS — I5032 Chronic diastolic (congestive) heart failure: Secondary | ICD-10-CM | POA: Diagnosis present

## 2019-06-12 DIAGNOSIS — F419 Anxiety disorder, unspecified: Secondary | ICD-10-CM | POA: Diagnosis present

## 2019-06-12 DIAGNOSIS — Z7952 Long term (current) use of systemic steroids: Secondary | ICD-10-CM

## 2019-06-12 DIAGNOSIS — Z515 Encounter for palliative care: Secondary | ICD-10-CM | POA: Diagnosis not present

## 2019-06-12 DIAGNOSIS — Z9221 Personal history of antineoplastic chemotherapy: Secondary | ICD-10-CM

## 2019-06-12 DIAGNOSIS — Z8349 Family history of other endocrine, nutritional and metabolic diseases: Secondary | ICD-10-CM

## 2019-06-12 DIAGNOSIS — I471 Supraventricular tachycardia: Secondary | ICD-10-CM | POA: Diagnosis present

## 2019-06-12 DIAGNOSIS — I4892 Unspecified atrial flutter: Secondary | ICD-10-CM | POA: Diagnosis present

## 2019-06-12 DIAGNOSIS — J9811 Atelectasis: Secondary | ICD-10-CM | POA: Diagnosis present

## 2019-06-12 DIAGNOSIS — J439 Emphysema, unspecified: Secondary | ICD-10-CM | POA: Diagnosis present

## 2019-06-12 DIAGNOSIS — R710 Precipitous drop in hematocrit: Secondary | ICD-10-CM | POA: Diagnosis not present

## 2019-06-12 DIAGNOSIS — I2510 Atherosclerotic heart disease of native coronary artery without angina pectoris: Secondary | ICD-10-CM | POA: Diagnosis present

## 2019-06-12 DIAGNOSIS — Z20828 Contact with and (suspected) exposure to other viral communicable diseases: Secondary | ICD-10-CM | POA: Diagnosis present

## 2019-06-12 DIAGNOSIS — J9611 Chronic respiratory failure with hypoxia: Secondary | ICD-10-CM | POA: Diagnosis not present

## 2019-06-12 DIAGNOSIS — J9601 Acute respiratory failure with hypoxia: Secondary | ICD-10-CM

## 2019-06-12 DIAGNOSIS — I110 Hypertensive heart disease with heart failure: Secondary | ICD-10-CM | POA: Diagnosis present

## 2019-06-12 DIAGNOSIS — D630 Anemia in neoplastic disease: Secondary | ICD-10-CM | POA: Diagnosis present

## 2019-06-12 DIAGNOSIS — J449 Chronic obstructive pulmonary disease, unspecified: Secondary | ICD-10-CM | POA: Diagnosis present

## 2019-06-12 DIAGNOSIS — D696 Thrombocytopenia, unspecified: Secondary | ICD-10-CM | POA: Diagnosis present

## 2019-06-12 DIAGNOSIS — Z902 Acquired absence of lung [part of]: Secondary | ICD-10-CM

## 2019-06-12 DIAGNOSIS — E876 Hypokalemia: Secondary | ICD-10-CM | POA: Diagnosis not present

## 2019-06-12 DIAGNOSIS — I5043 Acute on chronic combined systolic (congestive) and diastolic (congestive) heart failure: Secondary | ICD-10-CM

## 2019-06-12 DIAGNOSIS — I5033 Acute on chronic diastolic (congestive) heart failure: Secondary | ICD-10-CM | POA: Diagnosis present

## 2019-06-12 DIAGNOSIS — I82409 Acute embolism and thrombosis of unspecified deep veins of unspecified lower extremity: Secondary | ICD-10-CM | POA: Diagnosis not present

## 2019-06-12 DIAGNOSIS — Z823 Family history of stroke: Secondary | ICD-10-CM

## 2019-06-12 DIAGNOSIS — C3491 Malignant neoplasm of unspecified part of right bronchus or lung: Secondary | ICD-10-CM

## 2019-06-12 DIAGNOSIS — F17210 Nicotine dependence, cigarettes, uncomplicated: Secondary | ICD-10-CM | POA: Diagnosis present

## 2019-06-12 DIAGNOSIS — Z9981 Dependence on supplemental oxygen: Secondary | ICD-10-CM

## 2019-06-12 DIAGNOSIS — Z8249 Family history of ischemic heart disease and other diseases of the circulatory system: Secondary | ICD-10-CM

## 2019-06-12 DIAGNOSIS — F329 Major depressive disorder, single episode, unspecified: Secondary | ICD-10-CM | POA: Diagnosis present

## 2019-06-12 DIAGNOSIS — Z7189 Other specified counseling: Secondary | ICD-10-CM | POA: Diagnosis not present

## 2019-06-12 DIAGNOSIS — Z803 Family history of malignant neoplasm of breast: Secondary | ICD-10-CM

## 2019-06-12 DIAGNOSIS — R627 Adult failure to thrive: Secondary | ICD-10-CM | POA: Diagnosis present

## 2019-06-12 DIAGNOSIS — Z66 Do not resuscitate: Secondary | ICD-10-CM | POA: Diagnosis present

## 2019-06-12 DIAGNOSIS — R531 Weakness: Secondary | ICD-10-CM | POA: Diagnosis present

## 2019-06-12 DIAGNOSIS — R579 Shock, unspecified: Secondary | ICD-10-CM | POA: Diagnosis not present

## 2019-06-12 DIAGNOSIS — K219 Gastro-esophageal reflux disease without esophagitis: Secondary | ICD-10-CM | POA: Diagnosis present

## 2019-06-12 DIAGNOSIS — R4182 Altered mental status, unspecified: Secondary | ICD-10-CM | POA: Diagnosis not present

## 2019-06-12 DIAGNOSIS — A419 Sepsis, unspecified organism: Secondary | ICD-10-CM | POA: Diagnosis present

## 2019-06-12 DIAGNOSIS — J9621 Acute and chronic respiratory failure with hypoxia: Secondary | ICD-10-CM | POA: Diagnosis present

## 2019-06-12 DIAGNOSIS — Z5320 Procedure and treatment not carried out because of patient's decision for unspecified reasons: Secondary | ICD-10-CM | POA: Diagnosis present

## 2019-06-12 DIAGNOSIS — E785 Hyperlipidemia, unspecified: Secondary | ICD-10-CM | POA: Diagnosis present

## 2019-06-12 DIAGNOSIS — E782 Mixed hyperlipidemia: Secondary | ICD-10-CM | POA: Diagnosis not present

## 2019-06-12 DIAGNOSIS — Z6822 Body mass index (BMI) 22.0-22.9, adult: Secondary | ICD-10-CM

## 2019-06-12 DIAGNOSIS — Z955 Presence of coronary angioplasty implant and graft: Secondary | ICD-10-CM

## 2019-06-12 DIAGNOSIS — I483 Typical atrial flutter: Secondary | ICD-10-CM | POA: Diagnosis not present

## 2019-06-12 DIAGNOSIS — I5031 Acute diastolic (congestive) heart failure: Secondary | ICD-10-CM | POA: Diagnosis not present

## 2019-06-12 HISTORY — DX: Acute kidney failure, unspecified: N17.9

## 2019-06-12 HISTORY — DX: Acute on chronic diastolic (congestive) heart failure: I50.33

## 2019-06-12 LAB — CBC WITH DIFFERENTIAL/PLATELET
Abs Immature Granulocytes: 2.11 10*3/uL — ABNORMAL HIGH (ref 0.00–0.07)
Basophils Absolute: 0.2 10*3/uL — ABNORMAL HIGH (ref 0.0–0.1)
Basophils Relative: 1 %
Eosinophils Absolute: 0 10*3/uL (ref 0.0–0.5)
Eosinophils Relative: 0 %
HCT: 26 % — ABNORMAL LOW (ref 36.0–46.0)
Hemoglobin: 7.8 g/dL — ABNORMAL LOW (ref 12.0–15.0)
Immature Granulocytes: 13 %
Lymphocytes Relative: 13 %
Lymphs Abs: 2.2 10*3/uL (ref 0.7–4.0)
MCH: 28.3 pg (ref 26.0–34.0)
MCHC: 30 g/dL (ref 30.0–36.0)
MCV: 94.2 fL (ref 80.0–100.0)
Monocytes Absolute: 0.9 10*3/uL (ref 0.1–1.0)
Monocytes Relative: 5 %
Neutro Abs: 11.5 10*3/uL — ABNORMAL HIGH (ref 1.7–7.7)
Neutrophils Relative %: 68 %
Platelets: 128 10*3/uL — ABNORMAL LOW (ref 150–400)
RBC: 2.76 MIL/uL — ABNORMAL LOW (ref 3.87–5.11)
RDW: 19.9 % — ABNORMAL HIGH (ref 11.5–15.5)
WBC Morphology: INCREASED
WBC: 16.9 10*3/uL — ABNORMAL HIGH (ref 4.0–10.5)
nRBC: 1.2 % — ABNORMAL HIGH (ref 0.0–0.2)

## 2019-06-12 LAB — URINALYSIS, ROUTINE W REFLEX MICROSCOPIC
Bilirubin Urine: NEGATIVE
Glucose, UA: NEGATIVE mg/dL
Hgb urine dipstick: NEGATIVE
Ketones, ur: NEGATIVE mg/dL
Nitrite: NEGATIVE
Protein, ur: NEGATIVE mg/dL
Specific Gravity, Urine: 1.01 (ref 1.005–1.030)
pH: 5 (ref 5.0–8.0)

## 2019-06-12 LAB — SARS CORONAVIRUS 2 BY RT PCR (HOSPITAL ORDER, PERFORMED IN ~~LOC~~ HOSPITAL LAB): SARS Coronavirus 2: NEGATIVE

## 2019-06-12 LAB — COMPREHENSIVE METABOLIC PANEL
ALT: 26 U/L (ref 0–44)
AST: 34 U/L (ref 15–41)
Albumin: 2.9 g/dL — ABNORMAL LOW (ref 3.5–5.0)
Alkaline Phosphatase: 163 U/L — ABNORMAL HIGH (ref 38–126)
Anion gap: 12 (ref 5–15)
BUN: 39 mg/dL — ABNORMAL HIGH (ref 8–23)
CO2: 30 mmol/L (ref 22–32)
Calcium: 8.5 mg/dL — ABNORMAL LOW (ref 8.9–10.3)
Chloride: 98 mmol/L (ref 98–111)
Creatinine, Ser: 2.1 mg/dL — ABNORMAL HIGH (ref 0.44–1.00)
GFR calc Af Amer: 27 mL/min — ABNORMAL LOW (ref >60)
GFR calc non Af Amer: 23 mL/min — ABNORMAL LOW (ref >60)
Glucose, Bld: 98 mg/dL (ref 70–99)
Potassium: 4.7 mmol/L (ref 3.5–5.1)
Sodium: 140 mmol/L (ref 135–145)
Total Bilirubin: 0.3 mg/dL (ref 0.3–1.2)
Total Protein: 5.6 g/dL — ABNORMAL LOW (ref 6.5–8.1)

## 2019-06-12 LAB — MAGNESIUM: Magnesium: 2.2 mg/dL (ref 1.7–2.4)

## 2019-06-12 LAB — TROPONIN I (HIGH SENSITIVITY)
Troponin I (High Sensitivity): 5 ng/L (ref <18)
Troponin I (High Sensitivity): 6 ng/L (ref <18)

## 2019-06-12 LAB — MRSA PCR SCREENING: MRSA by PCR: NEGATIVE

## 2019-06-12 LAB — AMMONIA: Ammonia: 25 umol/L (ref 9–35)

## 2019-06-12 LAB — BRAIN NATRIURETIC PEPTIDE: B Natriuretic Peptide: 2597.8 pg/mL — ABNORMAL HIGH (ref 0.0–100.0)

## 2019-06-12 MED ORDER — PANTOPRAZOLE SODIUM 40 MG PO TBEC
40.0000 mg | DELAYED_RELEASE_TABLET | Freq: Every day | ORAL | Status: DC
  Administered 2019-06-13 – 2019-06-19 (×5): 40 mg via ORAL
  Filled 2019-06-12 (×5): qty 1

## 2019-06-12 MED ORDER — UMECLIDINIUM BROMIDE 62.5 MCG/INH IN AEPB
1.0000 | INHALATION_SPRAY | Freq: Every day | RESPIRATORY_TRACT | Status: DC
  Administered 2019-06-12 – 2019-06-19 (×5): 1 via RESPIRATORY_TRACT
  Filled 2019-06-12: qty 7

## 2019-06-12 MED ORDER — ALBUTEROL SULFATE (2.5 MG/3ML) 0.083% IN NEBU: 2.5000 mg | INHALATION_SOLUTION | Freq: Four times a day (QID) | RESPIRATORY_TRACT | Status: DC | PRN

## 2019-06-12 MED ORDER — PROCHLORPERAZINE MALEATE 10 MG PO TABS: 10.0000 mg | ORAL_TABLET | Freq: Four times a day (QID) | ORAL | Status: DC | PRN

## 2019-06-12 MED ORDER — ONDANSETRON HCL 4 MG/2ML IJ SOLN: 4.0000 mg | Freq: Four times a day (QID) | INTRAMUSCULAR | Status: DC | PRN

## 2019-06-12 MED ORDER — ALBUTEROL SULFATE (2.5 MG/3ML) 0.083% IN NEBU
5.0000 mg | INHALATION_SOLUTION | Freq: Once | RESPIRATORY_TRACT | Status: AC
  Administered 2019-06-12: 5 mg via RESPIRATORY_TRACT
  Filled 2019-06-12: qty 6

## 2019-06-12 MED ORDER — CYCLOSPORINE 0.05 % OP EMUL
1.0000 [drp] | Freq: Two times a day (BID) | OPHTHALMIC | Status: DC
  Administered 2019-06-12 – 2019-06-19 (×11): 1 [drp] via OPHTHALMIC
  Filled 2019-06-12 (×16): qty 30

## 2019-06-12 MED ORDER — LORATADINE 10 MG PO TABS
10.0000 mg | ORAL_TABLET | Freq: Every day | ORAL | Status: DC
  Administered 2019-06-12 – 2019-06-19 (×6): 10 mg via ORAL
  Filled 2019-06-12 (×6): qty 1

## 2019-06-12 MED ORDER — NICOTINE 21 MG/24HR TD PT24
21.0000 mg | MEDICATED_PATCH | TRANSDERMAL | Status: DC
  Administered 2019-06-12 – 2019-06-18 (×5): 21 mg via TRANSDERMAL
  Filled 2019-06-12 (×5): qty 1

## 2019-06-12 MED ORDER — SODIUM CHLORIDE 0.9 % IV BOLUS
500.0000 mL | Freq: Once | INTRAVENOUS | Status: AC
  Administered 2019-06-12: 12:00:00 500 mL via INTRAVENOUS

## 2019-06-12 MED ORDER — IPRATROPIUM-ALBUTEROL 0.5-2.5 (3) MG/3ML IN SOLN
3.0000 mL | Freq: Four times a day (QID) | RESPIRATORY_TRACT | Status: DC
  Administered 2019-06-12 – 2019-06-13 (×5): 3 mL via RESPIRATORY_TRACT
  Filled 2019-06-12 (×5): qty 3

## 2019-06-12 MED ORDER — FUROSEMIDE 10 MG/ML IJ SOLN
40.0000 mg | Freq: Once | INTRAMUSCULAR | Status: AC
  Administered 2019-06-12: 40 mg via INTRAVENOUS
  Filled 2019-06-12: qty 4

## 2019-06-12 MED ORDER — ONDANSETRON HCL 4 MG PO TABS: 4.0000 mg | ORAL_TABLET | Freq: Four times a day (QID) | ORAL | Status: DC | PRN

## 2019-06-12 MED ORDER — QUETIAPINE FUMARATE 100 MG PO TABS
100.0000 mg | ORAL_TABLET | Freq: Every day | ORAL | Status: DC
  Administered 2019-06-12 – 2019-06-18 (×6): 100 mg via ORAL
  Filled 2019-06-12 (×7): qty 1

## 2019-06-12 MED ORDER — METOCLOPRAMIDE HCL 10 MG PO TABS
5.0000 mg | ORAL_TABLET | Freq: Three times a day (TID) | ORAL | Status: DC
  Administered 2019-06-12 – 2019-06-19 (×17): 5 mg via ORAL
  Filled 2019-06-12 (×19): qty 1

## 2019-06-12 MED ORDER — ACETAMINOPHEN 650 MG RE SUPP: 650.0000 mg | Freq: Four times a day (QID) | RECTAL | Status: DC | PRN

## 2019-06-12 MED ORDER — TECHNETIUM TO 99M ALBUMIN AGGREGATED
1.7000 | Freq: Once | INTRAVENOUS | Status: AC
  Administered 2019-06-12: 22:00:00 1.7 via INTRAVENOUS

## 2019-06-12 MED ORDER — FLUTICASONE-UMECLIDIN-VILANT 100-62.5-25 MCG/INH IN AEPB: 1.0000 | INHALATION_SPRAY | Freq: Every day | RESPIRATORY_TRACT | Status: DC

## 2019-06-12 MED ORDER — ROPINIROLE HCL 0.5 MG PO TABS
0.5000 mg | ORAL_TABLET | Freq: Every day | ORAL | Status: DC
  Administered 2019-06-13 – 2019-06-18 (×4): 0.5 mg via ORAL
  Filled 2019-06-12 (×8): qty 1

## 2019-06-12 MED ORDER — SODIUM CHLORIDE 0.9 % IV SOLN
INTRAVENOUS | Status: DC
  Administered 2019-06-12: 12:00:00 via INTRAVENOUS

## 2019-06-12 MED ORDER — HEPARIN SODIUM (PORCINE) 5000 UNIT/ML IJ SOLN
5000.0000 [IU] | Freq: Three times a day (TID) | INTRAMUSCULAR | Status: DC
  Administered 2019-06-12 – 2019-06-19 (×13): 5000 [IU] via SUBCUTANEOUS
  Filled 2019-06-12 (×14): qty 1

## 2019-06-12 MED ORDER — SENNOSIDES-DOCUSATE SODIUM 8.6-50 MG PO TABS: 1.0000 | ORAL_TABLET | Freq: Every evening | ORAL | Status: DC | PRN

## 2019-06-12 MED ORDER — SODIUM CHLORIDE 0.9 % IV BOLUS
500.0000 mL | Freq: Once | INTRAVENOUS | Status: AC
  Administered 2019-06-12: 500 mL via INTRAVENOUS

## 2019-06-12 MED ORDER — LEVALBUTEROL HCL 0.63 MG/3ML IN NEBU: 0.6300 mg | INHALATION_SOLUTION | Freq: Four times a day (QID) | RESPIRATORY_TRACT | Status: DC

## 2019-06-12 MED ORDER — DEXAMETHASONE SODIUM PHOSPHATE 10 MG/ML IJ SOLN
10.0000 mg | Freq: Once | INTRAMUSCULAR | Status: AC
  Administered 2019-06-12: 10 mg via INTRAVENOUS
  Filled 2019-06-12: qty 1

## 2019-06-12 MED ORDER — FUROSEMIDE 10 MG/ML IJ SOLN
40.0000 mg | Freq: Two times a day (BID) | INTRAMUSCULAR | Status: DC
  Administered 2019-06-13 – 2019-06-14 (×2): 40 mg via INTRAVENOUS
  Filled 2019-06-12 (×2): qty 4

## 2019-06-12 MED ORDER — DULOXETINE HCL 30 MG PO CPEP
60.0000 mg | ORAL_CAPSULE | Freq: Every day | ORAL | Status: DC
  Administered 2019-06-12 – 2019-06-18 (×7): 60 mg via ORAL
  Filled 2019-06-12 (×7): qty 2

## 2019-06-12 MED ORDER — CHLORHEXIDINE GLUCONATE CLOTH 2 % EX PADS
6.0000 | MEDICATED_PAD | Freq: Every day | CUTANEOUS | Status: DC
  Administered 2019-06-12 – 2019-06-19 (×8): 6 via TOPICAL

## 2019-06-12 MED ORDER — AMIODARONE HCL 100 MG PO TABS: 100.0000 mg | ORAL_TABLET | Freq: Every day | ORAL | Status: DC

## 2019-06-12 MED ORDER — ONDANSETRON HCL 4 MG/2ML IJ SOLN
4.0000 mg | Freq: Four times a day (QID) | INTRAMUSCULAR | Status: DC | PRN
  Administered 2019-06-14: 14:00:00 4 mg via INTRAVENOUS
  Filled 2019-06-12: qty 2

## 2019-06-12 MED ORDER — ACETAMINOPHEN 325 MG PO TABS: 650.0000 mg | ORAL_TABLET | Freq: Four times a day (QID) | ORAL | Status: DC | PRN

## 2019-06-12 MED ORDER — ATORVASTATIN CALCIUM 80 MG PO TABS
80.0000 mg | ORAL_TABLET | Freq: Every day | ORAL | Status: DC
  Administered 2019-06-12 – 2019-06-19 (×5): 80 mg via ORAL
  Filled 2019-06-12 (×5): qty 1
  Filled 2019-06-12 (×2): qty 2
  Filled 2019-06-12 (×3): qty 1
  Filled 2019-06-12 (×5): qty 2

## 2019-06-12 MED ORDER — ORAL CARE MOUTH RINSE
15.0000 mL | Freq: Two times a day (BID) | OROMUCOSAL | Status: DC
  Administered 2019-06-12 – 2019-06-19 (×6): 15 mL via OROMUCOSAL

## 2019-06-12 MED ORDER — FLUTICASONE FUROATE-VILANTEROL 100-25 MCG/INH IN AEPB
1.0000 | INHALATION_SPRAY | Freq: Every day | RESPIRATORY_TRACT | Status: DC
  Administered 2019-06-12 – 2019-06-19 (×5): 1 via RESPIRATORY_TRACT
  Filled 2019-06-12: qty 28

## 2019-06-12 NOTE — ED Notes (Signed)
Respiratory notified of Bi-Pap order

## 2019-06-12 NOTE — ED Notes (Signed)
Report given to [place], RN

## 2019-06-12 NOTE — ED Notes (Signed)
Pt has removed nasal cannula, causing O2 sat to be in the 50%- 70%'s. [place] replaced.

## 2019-06-12 NOTE — ED Provider Notes (Signed)
[facility] DEPT Provider Note   CSN: [id number] Arrival date & time: 06/12/19  0800     History   Chief Complaint Chief Complaint  Patient presents with  . Fatigue  . Leg Swelling    HPI Tonya Bradley is a 68 y.o. female.     HPI Patient with lung cancer presents with dyspnea, fatigue, and concern about a fall. She notes that she is undergoing chemotherapy for lung cancer, has a history of cigarette addiction, and was generally in her usual state of health until this morning. She notes that she was so fatigued this morning upon awakening that she was unable to completely get from bed, fell onto the floor. No substantial pain in a new area, but since this morning she has had worsening diffuse weakness, including inability to hold a coffee cup. No confusion, no disorientation. There is baseline dyspnea that is apparently unchanged.  She denies fever.  Cancer clinic note from 05/2019: DIAGNOSIS: limited stage, stage Ia (T1b, N0, M0) small cell lung cancer    PRIOR THERAPY: status post right upper and middle bilobectomy with lymph node dissection under the care of Dr. [name] January 31, 2019.   CURRENT THERAPY: chemotherapy with cisplatin 80 mg/M2 on day 1 and etoposide 100 mg/M2 on days 1, 2 and 3 every 3 weeks.  Status post 1 cycle     Past Medical History:  Diagnosis Date  . Anemia   . Anxiety   . Arthritis    "back"  . Atrial flutter ([place]) 03/13/2016  . CAD (coronary artery disease)   . Cancer ([place])    skin cancer  . Cataract   . Colon polyp   . Complication of anesthesia    BP dropped during esophagus diltation in 2017-admitted for 3 days.   Marland Kitchen COPD (chronic obstructive pulmonary disease) ([place])   . Depression   . Dysrhythmia   . Emphysema of lung ([place])   . GERD (gastroesophageal reflux disease)   . Hypercholesterolemia   . Lung cancer ([place])   . Osteoporosis   . Oxygen deficiency     Patient Active Problem List   Diagnosis Date Noted  . Chemotherapy induced nausea and vomiting 05/26/2019  . Goals of care, counseling/discussion 03/25/2019  . Encounter for antineoplastic chemotherapy 03/25/2019  . Small cell lung cancer, right ([place]) 02/18/2019  . S/P Right Upper Lobectomy, Right Middle Lobectomy 02/04/2019  . Colitis 01/01/2019  . Dehydration 01/01/2019  . Hypokalemia 01/01/2019  . Dysphonia 06/19/2018  . Bilateral lower extremity edema 02/16/2018  . Lumbar stenosis 01/19/2018  . GERD (gastroesophageal reflux disease) 11/18/2017  . 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 COPD (chronic obstructive pulmonary disease) ([place]) 09/09/2017  . Varicose veins of bilateral lower extremities with other complications 68/34/1962  . Chronic pain disorder 05/26/2017  . Back pain 05/26/2017  . Insomnia 05/26/2017  . Osteopenia 11/26/2016  . Other emphysema ([place]) 11/07/2016  . Chronic anxiety 11/07/2016  . Chronic hypoxemic respiratory failure ([place]) 11/07/2016  . Constipation 06/12/2016  . Right carotid bruit 04/03/2016  . CAD (coronary artery disease) 04/03/2016  . Tobacco abuse 04/03/2016  . Atrial flutter ([place]) 03/13/2016  . Hiatal hernia   . Reflux esophagitis   . Schatzki's ring   . Dysphagia 03/01/2016  . Solitary pulmonary nodule 03/01/2016  . 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 Microscopic colitis 08/15/2014  . Loose stools 06/08/2014  . BCC (basal cell carcinoma), face 09/13/2012    Past Surgical  History:  Procedure Laterality Date  . A-FLUTTER ABLATION N/A 05/19/2017   Procedure: A-Flutter Ablation;  Surgeon: Evans Lance, MD;  Location: [facility];  Service: Cardiovascular;  Laterality: N/A;  . APPENDECTOMY    . BASAL CELL CARCINOMA EXCISION    . CARDIAC CATHETERIZATION    . CATARACT EXTRACTION Bilateral   . CHOLECYSTECTOMY    . COLONOSCOPY N/A 06/23/2014   Dr. Rourk:Rectal and colonic polyps-removed, Pancolonic diverticulosis.  lymphocytic colitis  . CORONARY ANGIOPLASTY WITH STENT PLACEMENT    . ESOPHAGEAL DILATION N/A 03/13/2016   Procedure: ESOPHAGEAL DILATION;  Surgeon: Daneil Dolin, MD;  [facility];  Service: Endoscopy;  Laterality: N/A;  . ESOPHAGOGASTRODUODENOSCOPY N/A 03/13/2016   Procedure: ESOPHAGOGASTRODUODENOSCOPY (EGD);  Surgeon: Daneil Dolin, MD;  [facility];  Service: Endoscopy;  Laterality: N/A;  0930  . EYE SURGERY     cataract removed, bilateral  . IR THORACENTESIS ASP PLEURAL SPACE W/IMG GUIDE  02/15/2019  . LUMBAR LAMINECTOMY/DECOMPRESSION MICRODISCECTOMY N/A 01/19/2018   Procedure: L4-5 DECOMPRESSION;  Surgeon: Marybelle Killings, MD;  Location: [facility];  Service: Orthopedics;  Laterality: N/A;  . VIDEO ASSISTED THORACOSCOPY (VATS)/ LOBECTOMY Right 02/04/2019   Procedure: VIDEO ASSISTED THORACOSCOPY (VATS)/RIGHT UPPER AND MIDDLE LOBECTOMY;  Surgeon: Melrose Nakayama, MD;  Location: [facility];  Service: Thoracic;  Laterality: Right;  Marland Kitchen VIDEO BRONCHOSCOPY N/A 02/14/2019   Procedure: BRONCHOSCOPY WITH SEDATION;  Surgeon: Melrose Nakayama, MD;  Location: [facility];  Service: Thoracic;  Laterality: N/A;     OB History   No obstetric history on file.      Home Medications    Prior to Admission medications   Medication Sig Start Date End Date Taking? Authorizing Provider  ALPRAZolam Duanne Moron) 0.5 MG tablet Take 1 tablet (0.5 mg total) by mouth 2 (two) times daily. 04/19/19   [place]ique, [name], MD  amiodarone (PACERONE) 200 MG tablet TAKE 1/2 TABLET BY MOUTH ONCE DAILY 03/27/19   Evans Lance, MD  atorvastatin (LIPITOR) 80 MG tablet TAKE 1 TABLET(80 MG) BY MOUTH DAILY 04/28/19   [place], [name], MD  Cholecalciferol (VITAMIN D) 2000 units CAPS Take 2,000 Units by mouth daily.    [provider]  cycloSPORINE (RESTASIS) 0.05 % ophthalmic emulsion Place 1 drop into both eyes 2 (two) times daily.    [provider]  dexamethasone (DECADRON) 4 MG tablet TAKE 1 TABLET(4  MG) BY MOUTH TWICE DAILY FOR 5 DAYS AFTER CHEMOTHERAPY 05/10/19   Curt Bears, MD  Dexlansoprazole (DEXILANT) 30 MG capsule Take 1 capsule (30 mg total) by mouth daily. 02/19/19   Martinique, [name], MD  diltiazem (CARDIZEM CD) 120 MG 24 hr capsule TAKE 1 CAPSULE(120 MG) BY MOUTH DAILY Patient taking differently: Take 120 mg by mouth daily.  01/08/19   Martinique, [name], MD  DULoxetine (CYMBALTA) 60 MG capsule Take 1 capsule (60 mg total) by mouth daily. Patient taking differently: Take 60 mg by mouth at bedtime.  10/06/18   Martinique, [name], MD  fluconazole (DIFLUCAN) 100 MG tablet Take 1 tablet (100 mg total) by mouth daily. Patient not taking: Reported on 05/26/2019 05/10/19   Harle Stanford., PA-C  furosemide (LASIX) 40 MG tablet Take 1.5 tablets (60 mg total) by mouth 2 (two) times daily. 05/27/19   Evans Lance, MD  guaiFENesin (MUCINEX) 600 MG 12 hr tablet Take 2 tablets (1,200 mg total) by mouth 2 (two) times daily. For 7 days, then use as needed  02/18/19   Barrett, Erin R, PA-C  LINZESS 290 MCG CAPS capsule TAKE 1 CAPSULE(290 MCG) BY MOUTH DAILY AS NEEDED FOR CONSTIPATION 06/11/19   [place], [name], MD  loratadine (CLARITIN) 10 MG tablet Take 10 mg by mouth daily.    [provider]  magnesium oxide (MAG-OX) 400 (241.3 Mg) MG tablet 1 tablet twice daily 04/19/19   Curt Bears, MD  metoCLOPramide (REGLAN) 5 MG tablet Take 1 tablet (5 mg total) by mouth 3 (three) times daily before meals. 05/11/19   Melrose Nakayama, MD  metoprolol tartrate (LOPRESSOR) 25 MG tablet Take 1 tablet (25 mg total) by mouth 2 (two) times daily. 02/18/19   Barrett, Erin R, PA-C  Multiple Vitamin (MULTIVITAMIN WITH MINERALS) TABS tablet Take 1 tablet by mouth daily.    [provider]  nitroGLYCERIN (NITROSTAT) 0.4 MG SL tablet Place 1 tablet (0.4 mg total) under the tongue every 5 (five) minutes as needed for chest pain. Reported on 06/12/2016 Patient not taking: Reported on 05/26/2019 10/22/18   [place],  [name], MD  ondansetron (ZOFRAN) 4 MG tablet TAKE 1 TABLET(4 MG) BY MOUTH TWICE DAILY AS NEEDED FOR NAUSEA OR VOMITING 05/11/19   [place], [name], MD  potassium chloride (K-DUR) 10 MEQ tablet Take 1 tablet (10 mEq total) by mouth 2 (two) times daily. 05/27/19   Evans Lance, MD  prochlorperazine (COMPAZINE) 10 MG tablet TAKE 1 TABLET(10 MG) BY MOUTH EVERY 6 HOURS AS NEEDED FOR NAUSEA OR VOMITING 05/10/19   Curt Bears, MD  QUEtiapine (SEROQUEL) 100 MG tablet TAKE 1 TABLET(100 MG) BY MOUTH AT BEDTIME 05/18/19   [place], [name], MD  rOPINIRole (REQUIP) 0.25 MG tablet TAKE 2 TABLETS(0.5 MG) BY MOUTH AT BEDTIME Patient taking differently: Take 0.5 mg by mouth at bedtime.  03/15/19   Martin[place], [name], MD  TRELEGY ELLIPTA 100-62.5-25 MCG/INH AEPB INHALE 1 PUFF INTO THE LUNGS DAILY Patient taking differently: Inhale 1 puff into the lungs daily.  12/29/18   Collene Gobble, MD  VENTOLIN HFA 108 (90 Base) MCG/ACT inhaler INHALE 2 PUFFS INTO THE LUNGS EVERY 6 HOURS AS NEEDED FOR WHEEZING OR SHORTNESS OF BREATH Patient taking differently: Inhale 2 puffs into the lungs every 6 (six) hours as needed (wheezing/shortness of breath).  09/19/17   [place], [name], MD    Family History Family History  Problem Relation Age of Onset  . Breast cancer Sister 38  . Heart disease Sister   . Heart attack Father   . Heart disease Father   . Stroke Mother   . Heart disease Mother   . Hyperlipidemia Mother   . Heart attack Sister   . Heart disease Brother        ?heart failure  . Congestive Heart Failure Brother   . Hypertension Son   . Heart disease Maternal Grandfather   . Cancer Cousin        lymphoma  . Hypertension Son   . Stomach cancer Other   . Colon cancer Neg Hx   . Pancreatic cancer Neg Hx     Social History Social History   Tobacco Use  . Smoking status: Current Every Day Smoker    Packs/day: 0.25    Years: 54.00    Pack years: 13.50    Types: Cigarettes    Start date: 06/14/1966  .  Smokeless tobacco: Never Used  . Tobacco comment: 1-2 packs daily. Chantix too expensive; pt states that she quit 2 weeks ago (02/04/19)  Substance Use Topics  . Alcohol use: No    Alcohol/week: 0.0 standard drinks  . Drug use: No     Allergies   Morphine and related   Review of Systems Review of Systems  Constitutional:       Per HPI, otherwise negative  HENT:       Per HPI, otherwise negative  Respiratory:       Per HPI, otherwise negative  Cardiovascular:       Per HPI, otherwise negative  Gastrointestinal: Negative for vomiting.  Endocrine:       Negative aside from HPI  Genitourinary:       Neg aside from HPI   Musculoskeletal:       Per HPI, otherwise negative  Skin: Positive for color change.  Allergic/Immunologic: Positive for immunocompromised state.  Neurological: Positive for weakness. Negative for syncope.     Physical Exam Updated Vital Signs BP (!) 81/47 (BP Location: Left Arm)   Pulse 68   Temp 97.6 F (36.4 C) (Oral)   Resp 20   Ht 5\' 2"  (1.575 m)   Wt 60.8 kg   SpO2 96%   BMI 24.51 kg/m   Physical Exam Vitals signs and nursing note reviewed.  Constitutional:      Appearance: She is well-developed. She is ill-appearing.     Comments: Sleepy appearing elderly female answer questions appropriately, though with a very quiet voice  HENT:     Head: Normocephalic and atraumatic.  Eyes:     Conjunctiva/sclera: Conjunctivae normal.  Cardiovascular:     Rate and Rhythm: Normal rate and regular rhythm.  Pulmonary:     Effort: Pulmonary effort is normal.     Breath sounds: Wheezing and rhonchi present.  Abdominal:     General: There is no distension.     Tenderness: There is no abdominal tenderness. There is no guarding.  Musculoskeletal:     Right lower leg: Edema present.     Left lower leg: Edema present.  Skin:    General: Skin is warm and dry.  Neurological:     Mental Status: She is alert and oriented to person, place, and time.      Cranial Nerves: No cranial nerve deficit.      ED Treatments / Results  Labs (all labs ordered are listed, but only abnormal results are displayed) Labs Reviewed  COMPREHENSIVE METABOLIC PANEL  CBC WITH DIFFERENTIAL/PLATELET  BRAIN NATRIURETIC PEPTIDE  URINALYSIS, ROUTINE W REFLEX MICROSCOPIC  MAGNESIUM    EKG  Sinus rhythm, rate 86, left deviation, nonspecific T wave changes and/or artifact Abnormal Radiology Dg Chest Port 1 View  Result Date: 06/12/2019 CLINICAL DATA:  Pain following fall EXAM: PORTABLE CHEST 1 VIEW COMPARISON:  Apr 18, 2019 FINDINGS: There is postoperative change on the right with volume loss and elevation of the right hemidiaphragm. There is no edema or consolidation. Heart is mildly enlarged with pulmonary vascularity within normal limits. No adenopathy. There is aortic atherosclerosis. No bone lesions. IMPRESSION: Postoperative change on the right with volume loss the. No edema or consolidation. Stable cardiac silhouette. Aortic Atherosclerosis (ICD10-I70.0). Electronically Signed   By: Lowella Grip III M.D.   On: 06/12/2019 09:15    Procedures Procedures (including critical care time)  Medications Ordered in ED Medications  atorvastatin (LIPITOR) tablet 80 mg (has no administration in time range)  furosemide (LASIX) injection 40 mg (has no administration in time range)  cycloSPORINE (RESTASIS) 0.05 % ophthalmic emulsion 1 drop (has no administration in time  range)  pantoprazole (PROTONIX) EC tablet 40 mg (has no administration in time range)  DULoxetine (CYMBALTA) DR capsule 60 mg (has no administration in time range)  loratadine (CLARITIN) tablet 10 mg (has no administration in time range)  metoCLOPramide (REGLAN) tablet 5 mg (has no administration in time range)  prochlorperazine (COMPAZINE) tablet 10 mg (has no administration in time range)  QUEtiapine (SEROQUEL) tablet 100 mg (has no administration in time range)  rOPINIRole (REQUIP) tablet 0.5  mg (has no administration in time range)  Fluticasone-Umeclidin-Vilant 100-62.5-25 MCG/INH AEPB 1 puff (has no administration in time range)  albuterol (VENTOLIN HFA) 108 (90 Base) MCG/ACT inhaler 2 puff (has no administration in time range)  furosemide (LASIX) injection 40 mg (has no administration in time range)  furosemide (LASIX) injection 40 mg (40 mg Intravenous Given 06/12/19 1211)  sodium chloride 0.9 % bolus 500 mL (0 mLs Intravenous Stopped 06/12/19 1252)  albuterol (PROVENTIL) (2.5 MG/3ML) 0.083% nebulizer solution 5 mg (5 mg Nebulization Given 06/12/19 1304)  dexamethasone (DECADRON) injection 10 mg (10 mg Intravenous Given 06/12/19 1259)     Initial Impression / Assessment and Plan / ED Course  I have reviewed the triage vital signs and the nursing notes.  Pertinent labs & imaging results that were available during my care of the patient were reviewed by me and considered in my medical decision making (see chart for details).    11:14 AM BP 90/60.    3:09 PM Patient substantially better, blood pressure improved.  On however, she is requiring additional oxygen, now 5 L via nasal cannula, up from baseline to. Additional findings reviewed with her at length, including evidence for worsening renal function, increased BNP suggesting worsening heart failure. Though her x-ray is essentially clear, she sounds quite congested, will likely benefit from BiPAP. However, she has been resistant to this modality, defers recommendation. Patient has however, produced substantial months of urine since receiving IV Lasix, and with need for monitoring monitoring management   This elderly female with ongoing lung cancer presents with fatigue, mild dyspnea. Patient is at baseline on 2 L nasal cannula, he requires 5 Patient sounds congested on exam, consistent with heart failure exacerbation, though her x-ray is unrevealing, which is somewhat reassuring.  On however, with diffuse edema, elevated BNP,  patient did receive IV Lasix, and fluids as she is likely intravascularly depleted, and there is evidence for acute kidney injury. Given this commendation, the patient was advised to BiPAP, which she deferred. She did, however, agree for admission to SDU for further monitoring, management.  Final Clinical Impressions(s) / ED Diagnoses   Final diagnoses:  Weakness  AKI (acute kidney injury) ([place])  Acute on chronic combined systolic and diastolic congestive heart failure ([place])   CRITICAL CARE Performed by: Carmin Muskrat Total critical care time: 35 minutes Critical care time was exclusive of separately billable procedures and treating other patients. Critical care was necessary to treat or prevent imminent or life-threatening deterioration. Critical care was time spent personally by me on the following activities: development of treatment plan with patient and/or surrogate as well as nursing, discussions with consultants, evaluation of patient's response to treatment, examination of patient, obtaining history from patient or surrogate, ordering and performing treatments and interventions, ordering and review of laboratory studies, ordering and review of radiographic studies, pulse oximetry and re-evaluation of patient's condition.    Carmin Muskrat, MD 06/12/19 1549

## 2019-06-12 NOTE — ED Notes (Signed)
Pt refused Bipap- states he husband is bringing her food and she wants to be able to eat- also states "I don't want that damn thing on me, I'm ready to go home". Provider notified.

## 2019-06-12 NOTE — Progress Notes (Addendum)
RT came to assess patient and place on BiPAP. Patient refusing BiPAP and eating her lunch. RT attempted to explain that if her breathing was not good that she needs to refrain from eating at this time and wear BiPAP to help her breathing. Patient continues to eat, refuses BiPAP, but agrees to breathing treatment. Treatment administered at this time and bedside RN and attending EDP made aware. RT will attempt at later time.

## 2019-06-12 NOTE — H&P (Signed)
History and Physical  Tonya Bradley NWG:[id number] DOB: 04-04-1951 DOA: 06/12/2019   Patient coming from: Home & is able to ambulate  Chief Complaint: Lethargy  HPI: Tonya Bradley is a 68 y.o. female with medical history significant for diastolic heart failure, atrial flutter, CAD, COPD oxygen dependent, small cell lung cancer, tobacco abuse, presented to the ED complaining of lethargy which is been ongoing past couple of days, bilateral lower extremity edema which has been ongoing for weeks.  Of note, patient is currently undergoing chemotherapy with Dr. Earlie Server.  Patient has chronic dyspnea, noted to be requiring more oxygen. Patient denies any chest pain, cough, nausea/vomiting, abdominal pain, diarrhea, fever/chills.  ED Course: Patient was noted to be hypotensive on presentation, also noted to be hypoxic dropping down to the 80s, requiring increased oxygen supplementation from her baseline.  Labs showed AKI with creatinine 2.1, baseline normal, BMP above 2000, troponin 5, EKG sinus rhythm with no acute ST changes.  Patient was given IV fluids as well as Lasix by EDP.  Patient refused BiPAP, but currently saturating well with 5 L of oxygen.  Patient admitted to SDU for further management  Review of Systems: Review of systems are otherwise negative   Past Medical History:  Diagnosis Date  . Acute on chronic diastolic (congestive) heart failure ([place]) 06/12/2019  . Anemia   . Anxiety   . Arthritis    "back"  . Atrial flutter ([place]) 03/13/2016  . CAD (coronary artery disease)   . Cancer ([place])    skin cancer  . Cataract   . Colon polyp   . Complication of anesthesia    BP dropped during esophagus diltation in 2017-admitted for 3 days.   Marland Kitchen COPD (chronic obstructive pulmonary disease) ([place])   . Depression   . Dysrhythmia   . Emphysema of lung ([place])   . GERD (gastroesophageal reflux disease)   . Hypercholesterolemia   . Lung cancer ([place])   . Osteoporosis   . Oxygen deficiency     Past Surgical History:  Procedure Laterality Date  . A-FLUTTER ABLATION N/A 05/19/2017   Procedure: A-Flutter Ablation;  Surgeon: Evans Lance, MD;  Location: [facility];  Service: Cardiovascular;  Laterality: N/A;  . APPENDECTOMY    . BASAL CELL CARCINOMA EXCISION    . CARDIAC CATHETERIZATION    . CATARACT EXTRACTION Bilateral   . CHOLECYSTECTOMY    . COLONOSCOPY N/A 06/23/2014   Dr. Rourk:Rectal and colonic polyps-removed, Pancolonic diverticulosis. lymphocytic colitis  . CORONARY ANGIOPLASTY WITH STENT PLACEMENT    . ESOPHAGEAL DILATION N/A 03/13/2016   Procedure: ESOPHAGEAL DILATION;  Surgeon: Daneil Dolin, MD;  [facility];  Service: Endoscopy;  Laterality: N/A;  . ESOPHAGOGASTRODUODENOSCOPY N/A 03/13/2016   Procedure: ESOPHAGOGASTRODUODENOSCOPY (EGD);  Surgeon: Daneil Dolin, MD;  [facility];  Service: Endoscopy;  Laterality: N/A;  0930  . EYE SURGERY     cataract removed, bilateral  . IR THORACENTESIS ASP PLEURAL SPACE W/IMG GUIDE  02/15/2019  . LUMBAR LAMINECTOMY/DECOMPRESSION MICRODISCECTOMY N/A 01/19/2018   Procedure: L4-5 DECOMPRESSION;  Surgeon: Marybelle Killings, MD;  Location: [facility];  Service: Orthopedics;  Laterality: N/A;  . VIDEO ASSISTED THORACOSCOPY (VATS)/ LOBECTOMY Right 02/04/2019   Procedure: VIDEO ASSISTED THORACOSCOPY (VATS)/RIGHT UPPER AND MIDDLE LOBECTOMY;  Surgeon: Melrose Nakayama, MD;  Location: [facility];  Service: Thoracic;  Laterality: Right;  Marland Kitchen VIDEO BRONCHOSCOPY N/A 02/14/2019   Procedure: BRONCHOSCOPY WITH SEDATION;  Surgeon: Melrose Nakayama, MD;  [facility]  OR;  Service: Thoracic;  Laterality: N/A;    Social History:  reports that she has been smoking cigarettes. She started smoking about 53 years ago. She has a 13.50 pack-year smoking history. She has never used smokeless tobacco. She reports that she does not drink alcohol or use drugs.   Allergies  Allergen Reactions  . Morphine And Related Other (See  Comments)    hallucinations    Family History  Problem Relation Age of Onset  . Breast cancer Sister 58  . Heart disease Sister   . Heart attack Father   . Heart disease Father   . Stroke Mother   . Heart disease Mother   . Hyperlipidemia Mother   . Heart attack Sister   . Heart disease Brother        ?heart failure  . Congestive Heart Failure Brother   . Hypertension Son   . Heart disease Maternal Grandfather   . Cancer Cousin        lymphoma  . Hypertension Son   . Stomach cancer Other   . Colon cancer Neg Hx   . Pancreatic cancer Neg Hx       Prior to Admission medications   Medication Sig Start Date End Date Taking? Authorizing Provider  ALPRAZolam Duanne Moron) 0.5 MG tablet Take 1 tablet (0.5 mg total) by mouth 2 (two) times daily. 04/19/19  Yes Martinique, [name], MD  amiodarone (PACERONE) 200 MG tablet TAKE 1/2 TABLET BY MOUTH ONCE DAILY Patient taking differently: Take 100 mg by mouth daily.  03/27/19  Yes Evans Lance, MD  atorvastatin (LIPITOR) 80 MG tablet TAKE 1 TABLET(80 MG) BY MOUTH DAILY Patient taking differently: Take 80 mg by mouth daily at 6 PM.  04/28/19  Yes Martinique, [name], MD  Cholecalciferol (VITAMIN D) 2000 units CAPS Take 2,000 Units by mouth daily.   Yes [provider]  cycloSPORINE (RESTASIS) 0.05 % ophthalmic emulsion Place 1 drop into both eyes 2 (two) times daily.   Yes [provider]  dexamethasone (DECADRON) 4 MG tablet TAKE 1 TABLET(4 MG) BY MOUTH TWICE DAILY FOR 5 DAYS AFTER CHEMOTHERAPY Patient taking differently: Take 4 mg by mouth 2 (two) times daily. FOR 5 DAYS AFTER CHEMOTHERAPY 05/10/19  Yes Curt Bears, MD  Dexlansoprazole (DEXILANT) 30 MG capsule Take 1 capsule (30 mg total) by mouth daily. 02/19/19  Yes Martinique, [name], MD  diltiazem (CARDIZEM CD) 120 MG 24 hr capsule TAKE 1 CAPSULE(120 MG) BY MOUTH DAILY Patient taking differently: Take 120 mg by mouth daily.  01/08/19  Yes Martinique, [name], MD  DULoxetine (CYMBALTA) 60  MG capsule Take 1 capsule (60 mg total) by mouth daily. Patient taking differently: Take 60 mg by mouth at bedtime.  10/06/18  Yes Martinique, [name], MD  furosemide (LASIX) 40 MG tablet Take 1.5 tablets (60 mg total) by mouth 2 (two) times daily. 05/27/19  Yes Evans Lance, MD  LINZESS 290 MCG CAPS capsule TAKE 1 CAPSULE(290 MCG) BY MOUTH DAILY AS NEEDED FOR CONSTIPATION Patient taking differently: Take 290 mcg by mouth daily as needed (constipation).  06/11/19  Yes Martinique, [name], MD  loratadine (CLARITIN) 10 MG tablet Take 10 mg by mouth daily.   Yes [provider]  magnesium oxide (MAG-OX) 400 (241.3 Mg) MG tablet 1 tablet twice daily Patient taking differently: Take 400 mg by mouth 2 (two) times daily.  04/19/19  Yes Curt Bears, MD  metoCLOPramide (REGLAN) 5 MG tablet Take 1 tablet (5  mg total) by mouth 3 (three) times daily before meals. 05/11/19  Yes Melrose Nakayama, MD  metoprolol tartrate (LOPRESSOR) 25 MG tablet Take 1 tablet (25 mg total) by mouth 2 (two) times daily. 02/18/19  Yes Barrett, Erin R, PA-C  Multiple Vitamin (MULTIVITAMIN WITH MINERALS) TABS tablet Take 1 tablet by mouth daily.   Yes [provider]  ondansetron (ZOFRAN) 4 MG tablet TAKE 1 TABLET(4 MG) BY MOUTH TWICE DAILY AS NEEDED FOR NAUSEA OR VOMITING Patient taking differently: Take 4 mg by mouth 2 (two) times daily as needed for nausea or vomiting.  05/11/19  Yes Martinique, [name], MD  potassium chloride (K-DUR) 10 MEQ tablet Take 1 tablet (10 mEq total) by mouth 2 (two) times daily. 05/27/19  Yes Evans Lance, MD  prochlorperazine (COMPAZINE) 10 MG tablet TAKE 1 TABLET(10 MG) BY MOUTH EVERY 6 HOURS AS NEEDED FOR NAUSEA OR VOMITING Patient taking differently: Take 10 mg by mouth every 6 (six) hours as needed for nausea or vomiting.  05/10/19  Yes Curt Bears, MD  QUEtiapine (SEROQUEL) 100 MG tablet TAKE 1 TABLET(100 MG) BY MOUTH AT BEDTIME Patient taking differently: Take 100 mg by mouth at  bedtime.  05/18/19  Yes Martinique, [name], MD  rOPINIRole (REQUIP) 0.25 MG tablet TAKE 2 TABLETS(0.5 MG) BY MOUTH AT BEDTIME Patient taking differently: Take 0.5 mg by mouth at bedtime.  03/15/19  Yes Martinique, [name], MD  TRELEGY ELLIPTA 100-62.5-25 MCG/INH AEPB INHALE 1 PUFF INTO THE LUNGS DAILY Patient taking differently: Inhale 1 puff into the lungs daily.  12/29/18  Yes Byrum, Rose Fillers, MD  VENTOLIN HFA 108 (90 Base) MCG/ACT inhaler INHALE 2 PUFFS INTO THE LUNGS EVERY 6 HOURS AS NEEDED FOR WHEEZING OR SHORTNESS OF BREATH Patient taking differently: Inhale 2 puffs into the lungs every 6 (six) hours as needed (wheezing/shortness of breath).  09/19/17  Yes Martinique, [name], MD  guaiFENesin (MUCINEX) 600 MG 12 hr tablet Take 2 tablets (1,200 mg total) by mouth 2 (two) times daily. For 7 days, then use as needed Patient not taking: Reported on 06/12/2019 02/18/19   Barrett, Lodema Hong, PA-C  nitroGLYCERIN (NITROSTAT) 0.4 MG SL tablet Place 1 tablet (0.4 mg total) under the tongue every 5 (five) minutes as needed for chest pain. Reported on 06/12/2016 Patient not taking: Reported on 05/26/2019 10/22/18   Martinique, [name], MD    Physical Exam: BP (!) 98/51 (BP Location: Left Arm)   Pulse 86   Temp 97.9 F (36.6 C) (Oral)   Resp 17   Ht 5\' 2"  (1.575 m)   Wt 65.9 kg   SpO2 94%   BMI 26.57 kg/m   General: Lethargic, mild distress Eyes: Normal ENT: Normal Neck: Supple Cardiovascular: S1, S2 present Respiratory: Coarse breath sounds/Rales noted bilaterally Abdomen: Soft, nontender, nondistended, bowel sounds present Skin: Normal Musculoskeletal: 2+ bilateral pitting edema noted Psychiatric: Normal mood Neurologic: No focal neurologic deficit noted          Labs on Admission:  Basic Metabolic Panel: Recent Labs  Lab 06/09/19 1154 06/12/19 0845  NA 140 140  K 2.7* 4.7  CL 91* 98  CO2 38* 30  GLUCOSE 143* 98  BUN 18 39*  CREATININE 0.93 2.10*  CALCIUM 9.0 8.5*  MG 1.5* 2.2   Liver  Function Tests: Recent Labs  Lab 06/09/19 1154 06/12/19 0845  AST 22 34  ALT 22 26  ALKPHOS 187* 163*  BILITOT 0.2* 0.3  PROT 6.1* 5.6*  ALBUMIN  3.1* 2.9*   No results for input(s): LIPASE, AMYLASE in the last 168 hours. Recent Labs  Lab 06/12/19 0851  AMMONIA 25   CBC: Recent Labs  Lab 06/09/19 1154 06/12/19 0845  WBC 15.7* 16.9*  NEUTROABS 9.3* 11.5*  HGB 8.2* 7.8*  HCT 25.8* 26.0*  MCV 88.7 94.2  PLT 63* 128*   Cardiac Enzymes: No results for input(s): CKTOTAL, CKMB, CKMBINDEX, TROPONINI in the last 168 hours.  BNP (last 3 results) Recent Labs    06/12/19 0845  BNP 2,597.8*    ProBNP (last 3 results) No results for input(s): PROBNP in the last 8760 hours.  CBG: No results for input(s): GLUCAP in the last 168 hours.  Radiological Exams on Admission: Dg Chest Port 1 View  Result Date: 06/12/2019 CLINICAL DATA:  Pain following fall EXAM: PORTABLE CHEST 1 VIEW COMPARISON:  Apr 18, 2019 FINDINGS: There is postoperative change on the right with volume loss and elevation of the right hemidiaphragm. There is no edema or consolidation. Heart is mildly enlarged with pulmonary vascularity within normal limits. No adenopathy. There is aortic atherosclerosis. No bone lesions. IMPRESSION: Postoperative change on the right with volume loss the. No edema or consolidation. Stable cardiac silhouette. Aortic Atherosclerosis (ICD10-I70.0). Electronically Signed   By: Lowella Grip III M.D.   On: 06/12/2019 09:15    EKG: Independently reviewed.  Sinus rhythm, with no acute ST changes  Assessment/Plan Present on Admission: . Acute on chronic diastolic (congestive) heart failure ([place]) . Atrial flutter ([place]) . CAD (coronary artery disease) . Tobacco abuse . Chronic hypoxemic respiratory failure ([place]) . COPD (chronic obstructive pulmonary disease) ([place]) . Small cell lung cancer, right ([facility])  Principal Problem:   Acute on chronic diastolic (congestive) heart failure  ([facility]) Active Problems:   Atrial flutter ([facility])   CAD (coronary artery disease)   Tobacco abuse   Chronic hypoxemic respiratory failure ([facility])   COPD (chronic obstructive pulmonary disease) ([facility])   Small cell lung cancer, right ([facility])  Acute on chronic hypoxic respiratory failure likely 2/2 acute on chronic diastolic HF Patient noted to desaturate to as low 70s 80s on 2 L, currently requiring about 5 L of oxygen to maintain sats above 90 Clinically looks overloaded BNP noted to be 2597, troponin negative, EKG with no acute ST changes Chest x-ray showed no consolidation or edema Echo done on 03/2016 showed EF of 55 to 60%, repeat pending Also ordered a VQ scan/Doppler to rule out PE/dvt, pending Spoke to cardiology, will consult tomorrow a.m.-recommending starting IV Lasix for adequate diuresis Received IV Lasix 80 mg today, continue IV 40 mg twice daily pending creatinine Follow BMP closely, strict I's and O's, daily weights Was unable to reach Dr. Melvia Heaps  from nephrology, please consider trying again in the a.m. pending BMP Monitor in SDU  AKI ??  Renal vascular congestion/cardiorenal/??unlikely intravascular volume depletion Daily BMP Strict I's and O's Consider consulting nephrology  Hypotension Monitor closely in SDU  Chronic leukocytosis Ongoing, around baseline Daily CBC  Anemia of chronic disease from malignancy Likely from chemotherapy, denies any GI bleeding FOBT pending Type and screen pending Daily CBC  Thrombocytopenia Chronic, ongoing Daily CBC  Atrial flutter Rate controlled Not on any anticoagulation Held PTA amiodarone, due to low BP  COPD Continue supplemental oxygen, inhalers  Small cell lung cancer Currently on chemotherapy Followed by Dr. Earlie Server, may consult in a.m.  GERD PPI  Tobacco abuse Advised to quit Nicotine patch  Depression/anxiety Continue quetiapine, Cymbalta    DVT  prophylaxis: Heparin  Code Status: DNR  Family  Communication: None at bedside  Disposition Plan: To be determined  Consults called: Cardiology  Admission status: Inpatient    Alma Friendly MD [facility] Pager [phone]  If 7PM-7AM, please contact night-coverage [email or web address] Password [facility]  06/12/2019, 7:04 PM

## 2019-06-12 NOTE — ED Notes (Signed)
ED TO INPATIENT HANDOFF REPORT  ED Nurse Name and Phone #:  Donah Driver, RN  S Name/Age/Gender Tonya Bradley 68 y.o. female Room/Bed: WA19/WA19  Code Status   Code Status: DNR  Home/SNF/Other Home Patient oriented to: self, place, time and situation Is this baseline? Yes   Triage Complete: Triage complete  Chief Complaint fall  Triage Note Pt arrived [facility] for a fall. Pt states that she has been feeling extremely tired/weak and this is why she slipped. Pt also reports that she is a [place] pt and was told recently that her K+ was low. +3 pitting edema noted to bilateral lower legs.   Allergies Allergies  Allergen Reactions  . Morphine And Related Other (See Comments)    hallucinations    Level of Care/Admitting Diagnosis ED Disposition    ED Disposition Condition [facility] Area: [place] [[id number]]  Level of Care: Stepdown [14]  Admit to SDU based on following criteria: Hemodynamic compromise or significant risk of instability:  Patient requiring short term acute titration and management of vasoactive drips, and invasive monitoring (i.e., CVP and Arterial line).  Covid Evaluation: Confirmed COVID Negative  Diagnosis: Acute on chronic diastolic (congestive) heart failure [facility]) [[id number]]  Admitting Physician: Alma Friendly [[id number]]  Attending Physician: Alma Friendly [[id number]]  Estimated length of stay: past midnight tomorrow  Certification:: I certify this patient will need inpatient services for at least 2 midnights  PT Class (Do Not Modify): Inpatient [101]  PT Acc Code (Do Not Modify): Private [1]       B Medical/Surgery History Past Medical History:  Diagnosis Date  . Acute on chronic diastolic (congestive) heart failure ([place]) 06/12/2019  . Anemia   . Anxiety   . Arthritis    "back"  . Atrial flutter ([place]) 03/13/2016  . CAD (coronary artery disease)   . Cancer ([place])    skin cancer  . Cataract   . Colon polyp    . Complication of anesthesia    BP dropped during esophagus diltation in 2017-admitted for 3 days.   Marland Kitchen COPD (chronic obstructive pulmonary disease) ([place])   . Depression   . Dysrhythmia   . Emphysema of lung ([place])   . GERD (gastroesophageal reflux disease)   . Hypercholesterolemia   . Lung cancer ([place])   . Osteoporosis   . Oxygen deficiency    Past Surgical History:  Procedure Laterality Date  . A-FLUTTER ABLATION N/A 05/19/2017   Procedure: A-Flutter Ablation;  Surgeon: Evans Lance, MD;  Location: [place] [facility];  Service: Cardiovascular;  Laterality: N/A;  . APPENDECTOMY    . BASAL CELL CARCINOMA EXCISION    . CARDIAC CATHETERIZATION    . CATARACT EXTRACTION Bilateral   . CHOLECYSTECTOMY    . COLONOSCOPY N/A 06/23/2014   Dr. Rourk:Rectal and colonic polyps-removed, Pancolonic diverticulosis. lymphocytic colitis  . CORONARY ANGIOPLASTY WITH STENT PLACEMENT    . ESOPHAGEAL DILATION N/A 03/13/2016   Procedure: ESOPHAGEAL DILATION;  Surgeon: Daneil Dolin, MD;  [facility];  Service: Endoscopy;  Laterality: N/A;  . ESOPHAGOGASTRODUODENOSCOPY N/A 03/13/2016   Procedure: ESOPHAGOGASTRODUODENOSCOPY (EGD);  Surgeon: Daneil Dolin, MD;  [facility];  Service: Endoscopy;  Laterality: N/A;  0930  . EYE SURGERY     cataract removed, bilateral  . IR THORACENTESIS ASP PLEURAL SPACE W/IMG GUIDE  02/15/2019  . LUMBAR LAMINECTOMY/DECOMPRESSION MICRODISCECTOMY N/A 01/19/2018   Procedure: L4-5 DECOMPRESSION;  Surgeon: Marybelle Killings, MD;  Location: [place];  Service: Orthopedics;  Laterality: N/A;  . VIDEO ASSISTED THORACOSCOPY (VATS)/ LOBECTOMY Right 02/04/2019   Procedure: VIDEO ASSISTED THORACOSCOPY (VATS)/RIGHT UPPER AND MIDDLE LOBECTOMY;  Surgeon: Melrose Nakayama, MD;  Location: [place];  Service: Thoracic;  Laterality: Right;  Marland Kitchen VIDEO BRONCHOSCOPY N/A 02/14/2019   Procedure: BRONCHOSCOPY WITH SEDATION;  Surgeon: Melrose Nakayama, MD;  [facility] OR;   Service: Thoracic;  Laterality: N/A;     A IV Location/Drains/Wounds Patient Lines/Drains/Airways Status   Active Line/Drains/Airways    Name:   Placement date:   Placement time:   Site:   Days:   Peripheral IV 06/12/19 Left;Posterior Hand   06/12/19    0824    Hand   less than 1   External Urinary Catheter   06/12/19    1219    -   less than 1   Incision (Closed) 02/04/19 Chest Right   02/04/19    1225     128   Incision (Closed) 02/14/19 N/A Other (Comment)   02/14/19    1037     118          Intake/Output Last 24 hours No intake or output data in the 24 hours ending 06/12/19 1521  Labs/Imaging Results for orders placed or performed during the hospital encounter of 06/12/19 (from the past 48 hour(s))  Comprehensive metabolic panel     Status: Abnormal   Collection Time: 06/12/19  8:45 AM  Result Value Ref Range   Sodium 140 135 - 145 mmol/L   Potassium 4.7 3.5 - 5.1 mmol/L   Chloride 98 98 - 111 mmol/L   CO2 30 22 - 32 mmol/L   Glucose, Bld 98 70 - 99 mg/dL   BUN 39 (H) 8 - 23 mg/dL   Creatinine, Ser 2.10 (H) 0.44 - 1.00 mg/dL   Calcium 8.5 (L) 8.9 - 10.3 mg/dL   Total Protein 5.6 (L) 6.5 - 8.1 g/dL   Albumin 2.9 (L) 3.5 - 5.0 g/dL   AST 34 15 - 41 U/L   ALT 26 0 - 44 U/L   Alkaline Phosphatase 163 (H) 38 - 126 U/L   Total Bilirubin 0.3 0.3 - 1.2 mg/dL   GFR calc non Af Amer 23 (L) >60 mL/min   GFR calc Af Amer 27 (L) >60 mL/min   Anion gap 12 5 - 15    Comment: Performed at [facility], [place] [address] [place], [place] [address]  CBC WITH DIFFERENTIAL     Status: Abnormal   Collection Time: 06/12/19  8:45 AM  Result Value Ref Range   WBC 16.9 (H) 4.0 - 10.5 K/uL   RBC 2.76 (L) 3.87 - 5.11 MIL/uL   Hemoglobin 7.8 (L) 12.0 - 15.0 g/dL   HCT 26.0 (L) 36.0 - 46.0 %   MCV 94.2 80.0 - 100.0 fL   MCH 28.3 26.0 - 34.0 pg   MCHC 30.0 30.0 - 36.0 g/dL   RDW 19.9 (H) 11.5 - 15.5 %   Platelets 128 (L) 150 - 400 K/uL   nRBC 1.2 (H) 0.0 - 0.2 %    Neutrophils Relative % 68 %   Neutro Abs 11.5 (H) 1.7 - 7.7 K/uL   Lymphocytes Relative 13 %   Lymphs Abs 2.2 0.7 - 4.0 K/uL   Monocytes Relative 5 %   Monocytes Absolute 0.9 0.1 - 1.0 K/uL   Eosinophils Relative 0 %   Eosinophils Absolute 0.0 0.0 - 0.5 K/uL  Basophils Relative 1 %   Basophils Absolute 0.2 (H) 0.0 - 0.1 K/uL   WBC Morphology INCREASED BANDS (>20% BANDS)     Comment: MILD LEFT SHIFT (1-5% METAS, OCC MYELO, OCC BANDS)   Immature Granulocytes 13 %   Abs Immature Granulocytes 2.11 (H) 0.00 - 0.07 K/uL   Acanthocytes PRESENT    Polychromasia PRESENT     Comment: Performed at [facility], [place] [address] [place], [place] [address]  Brain natriuretic peptide     Status: Abnormal   Collection Time: 06/12/19  8:45 AM  Result Value Ref Range   B Natriuretic Peptide 2,597.8 (H) 0.0 - 100.0 pg/mL    Comment: Performed at [facility], [place] [address], [place], [place] [address]  Magnesium     Status: None   Collection Time: 06/12/19  8:45 AM  Result Value Ref Range   Magnesium 2.2 1.7 - 2.4 mg/dL    Comment: Performed at [facility], [place] [address] [place], [place] [address]  Ammonia     Status: None   Collection Time: 06/12/19  8:51 AM  Result Value Ref Range   Ammonia 25 9 - 35 umol/L    Comment: Performed at [facility], [place] [address] [place],  [address]  SARS Coronavirus 2 (CEPHEID - Performed in [place] hospital lab), Hosp Order     Status: None   Collection Time: 06/12/19 10:59 AM   Specimen: Nasopharyngeal Swab  Result Value Ref Range   SARS Coronavirus 2 NEGATIVE NEGATIVE    Comment: (NOTE) If result is NEGATIVE SARS-CoV-2 target nucleic acids are NOT DETECTED. The SARS-CoV-2 RNA is generally detectable in upper and lower  respiratory specimens during the acute phase of infection. The lowest  concentration of SARS-CoV-2 viral copies this assay can detect is 250  copies  / mL. A negative result does not preclude SARS-CoV-2 infection  and should not be used as the sole basis for treatment or other  patient management decisions.  A negative result may occur with  improper specimen collection / handling, submission of specimen other  than nasopharyngeal swab, presence of viral mutation(s) within the  areas targeted by this assay, and inadequate number of viral copies  (<250 copies / mL). A negative result must be combined with clinical  observations, patient history, and epidemiological information. If result is POSITIVE SARS-CoV-2 target nucleic acids are DETECTED. The SARS-CoV-2 RNA is generally detectable in upper and lower  respiratory specimens dur ing the acute phase of infection.  Positive  results are indicative of active infection with SARS-CoV-2.  Clinical  correlation with patient history and other diagnostic information is  necessary to determine patient infection status.  Positive results do  not rule out bacterial infection or co-infection with other viruses. If result is PRESUMPTIVE POSTIVE SARS-CoV-2 nucleic acids MAY BE PRESENT.   A presumptive positive result was obtained on the submitted specimen  and confirmed on repeat testing.  While 2019 novel coronavirus  (SARS-CoV-2) nucleic acids may be present in the submitted sample  additional confirmatory testing may be necessary for epidemiological  and / or clinical management purposes  to differentiate between  SARS-CoV-2 and other Sarbecovirus currently known to infect humans.  If clinically indicated additional testing with an alternate test  methodology [phone]) is advised. The SARS-CoV-2 RNA is generally  detectable in upper and lower respiratory sp ecimens during the acute  phase of infection. The expected result is Negative. Fact Sheet for  Patients:  StrictlyIdeas.no Fact Sheet for Healthcare Providers: BankingDealers.co.za This test  is not yet approved or cleared by the [place] FDA and has been authorized for detection and/or diagnosis of SARS-CoV-2 by FDA under an Emergency Use Authorization (EUA).  This EUA will remain in effect (meaning this test can be used) for the duration of the COVID-19 declaration under Section 564(b)(1) of the Act, 21 U.S.C. section 360bbb-3(b)(1), unless the authorization is terminated or revoked sooner. Performed at [facility], [place] [address] [place], [place] [address]   Urinalysis, Routine w reflex microscopic     Status: Abnormal   Collection Time: 06/12/19  1:40 PM  Result Value Ref Range   Color, Urine YELLOW YELLOW   APPearance HAZY (A) CLEAR   Specific Gravity, Urine 1.010 1.005 - 1.030   pH 5.0 5.0 - 8.0   Glucose, UA NEGATIVE NEGATIVE mg/dL   Hgb urine dipstick NEGATIVE NEGATIVE   Bilirubin Urine NEGATIVE NEGATIVE   Ketones, ur NEGATIVE NEGATIVE mg/dL   Protein, ur NEGATIVE NEGATIVE mg/dL   Nitrite NEGATIVE NEGATIVE   Leukocytes,Ua TRACE (A) NEGATIVE   RBC / HPF 0-5 0 - 5 RBC/hpf   WBC, UA 6-10 0 - 5 WBC/hpf   Bacteria, UA RARE (A) NONE SEEN   Squamous Epithelial / LPF 11-20 0 - 5   Hyaline Casts, UA PRESENT     Comment: Performed at [facility], [place] [address] [place], [place] [address]   Dg Chest Port 1 View  Result Date: 06/12/2019 CLINICAL DATA:  Pain following fall EXAM: PORTABLE CHEST 1 VIEW COMPARISON:  Apr 18, 2019 FINDINGS: There is postoperative change on the right with volume loss and elevation of the right hemidiaphragm. There is no edema or consolidation. Heart is mildly enlarged with pulmonary vascularity within normal limits. No adenopathy. There is aortic atherosclerosis. No bone lesions. IMPRESSION: Postoperative change on the right with volume loss the. No edema or consolidation. Stable cardiac silhouette. Aortic Atherosclerosis (ICD10-I70.0). Electronically Signed   By: Lowella Grip III M.D.   On:  06/12/2019 09:15    Pending Labs Unresulted Labs (From admission, onward)   None      Vitals/Pain Today's Vitals   06/12/19 1445 06/12/19 1456 06/12/19 1515 06/12/19 1520  BP:      Pulse: 83 87 87 85  Resp:  17    Temp:      TempSrc:      SpO2: 92% 91% 94% 93%  Weight:      Height:      PainSc:        Isolation Precautions No active isolations  Medications Medications  amiodarone (PACERONE) tablet 100 mg (has no administration in time range)  atorvastatin (LIPITOR) tablet 80 mg (has no administration in time range)  furosemide (LASIX) injection 40 mg (has no administration in time range)  furosemide (LASIX) injection 40 mg (40 mg Intravenous Given 06/12/19 1211)  sodium chloride 0.9 % bolus 500 mL (0 mLs Intravenous Stopped 06/12/19 1252)  albuterol (PROVENTIL) (2.5 MG/3ML) 0.083% nebulizer solution 5 mg (5 mg Nebulization Given 06/12/19 1304)  dexamethasone (DECADRON) injection 10 mg (10 mg Intravenous Given 06/12/19 1259)    Mobility walks High fall risk   Focused Assessments Pulmonary Assessment Handoff:  Lung sounds: Bilateral Breath Sounds: Coarse crackles L Breath Sounds: Rhonchi O2 Device: Nasal Cannula O2 Flow Rate (L/min): 5 L/min      R Recommendations: See Admitting Provider Note  Report given to:   Additional Notes:

## 2019-06-12 NOTE — ED Triage Notes (Signed)
Pt arrived [facility] for a fall. Pt states that she has been feeling extremely tired/weak and this is why she slipped. Pt also reports that she is a CA pt and was told recently that her K+ was low. +3 pitting edema noted to bilateral lower legs.

## 2019-06-13 ENCOUNTER — Encounter (HOSPITAL_COMMUNITY): Payer: Self-pay | Admitting: Internal Medicine

## 2019-06-13 ENCOUNTER — Inpatient Hospital Stay (HOSPITAL_COMMUNITY): Payer: Medicare HMO

## 2019-06-13 DIAGNOSIS — I5031 Acute diastolic (congestive) heart failure: Secondary | ICD-10-CM

## 2019-06-13 DIAGNOSIS — I4892 Unspecified atrial flutter: Secondary | ICD-10-CM

## 2019-06-13 DIAGNOSIS — I82409 Acute embolism and thrombosis of unspecified deep veins of unspecified lower extremity: Secondary | ICD-10-CM

## 2019-06-13 DIAGNOSIS — N179 Acute kidney failure, unspecified: Secondary | ICD-10-CM

## 2019-06-13 DIAGNOSIS — E782 Mixed hyperlipidemia: Secondary | ICD-10-CM

## 2019-06-13 DIAGNOSIS — R579 Shock, unspecified: Secondary | ICD-10-CM

## 2019-06-13 DIAGNOSIS — I2510 Atherosclerotic heart disease of native coronary artery without angina pectoris: Secondary | ICD-10-CM

## 2019-06-13 LAB — BASIC METABOLIC PANEL
Anion gap: 15 (ref 5–15)
BUN: 32 mg/dL — ABNORMAL HIGH (ref 8–23)
CO2: 33 mmol/L — ABNORMAL HIGH (ref 22–32)
Calcium: 8.9 mg/dL (ref 8.9–10.3)
Chloride: 95 mmol/L — ABNORMAL LOW (ref 98–111)
Creatinine, Ser: 1.18 mg/dL — ABNORMAL HIGH (ref 0.44–1.00)
GFR calc Af Amer: 54 mL/min — ABNORMAL LOW (ref >60)
GFR calc non Af Amer: 47 mL/min — ABNORMAL LOW (ref >60)
Glucose, Bld: 169 mg/dL — ABNORMAL HIGH (ref 70–99)
Potassium: 3.9 mmol/L (ref 3.5–5.1)
Sodium: 143 mmol/L (ref 135–145)

## 2019-06-13 LAB — PROCALCITONIN: Procalcitonin: <0.1 ng/mL

## 2019-06-13 LAB — CBC
HCT: 28.7 % — ABNORMAL LOW (ref 36.0–46.0)
Hemoglobin: 8.8 g/dL — ABNORMAL LOW (ref 12.0–15.0)
MCH: 28.7 pg (ref 26.0–34.0)
MCHC: 30.7 g/dL (ref 30.0–36.0)
MCV: 93.5 fL (ref 80.0–100.0)
Platelets: 227 10*3/uL (ref 150–400)
RBC: 3.07 MIL/uL — ABNORMAL LOW (ref 3.87–5.11)
RDW: 20.1 % — ABNORMAL HIGH (ref 11.5–15.5)
WBC: 22.7 10*3/uL — ABNORMAL HIGH (ref 4.0–10.5)
nRBC: 0.6 % — ABNORMAL HIGH (ref 0.0–0.2)

## 2019-06-13 LAB — ABO/RH: ABO/RH(D): O NEG

## 2019-06-13 LAB — ECHOCARDIOGRAM COMPLETE
Height: 62 in
Weight: 2324.53 oz

## 2019-06-13 MED ORDER — SODIUM CHLORIDE 0.9 % IV SOLN
2.0000 g | Freq: Two times a day (BID) | INTRAVENOUS | Status: DC
  Administered 2019-06-13 – 2019-06-14 (×2): 2 g via INTRAVENOUS
  Filled 2019-06-13 (×2): qty 2

## 2019-06-13 MED ORDER — VANCOMYCIN HCL 10 G IV SOLR
1250.0000 mg | Freq: Once | INTRAVENOUS | Status: AC
  Administered 2019-06-13: 1250 mg via INTRAVENOUS
  Filled 2019-06-13: qty 1250

## 2019-06-13 MED ORDER — VANCOMYCIN HCL IN DEXTROSE 750-5 MG/150ML-% IV SOLN: 750.0000 mg | INTRAVENOUS | Status: DC

## 2019-06-13 MED ORDER — IOHEXOL 350 MG/ML SOLN
100.0000 mL | Freq: Once | INTRAVENOUS | Status: AC | PRN
  Administered 2019-06-13: 75 mL via INTRAVENOUS

## 2019-06-13 MED ORDER — SODIUM CHLORIDE (PF) 0.9 % IJ SOLN
INTRAMUSCULAR | Status: AC
  Filled 2019-06-13: qty 50

## 2019-06-13 MED ORDER — IPRATROPIUM-ALBUTEROL 0.5-2.5 (3) MG/3ML IN SOLN
3.0000 mL | Freq: Three times a day (TID) | RESPIRATORY_TRACT | Status: DC
  Administered 2019-06-14 – 2019-06-15 (×6): 3 mL via RESPIRATORY_TRACT
  Filled 2019-06-13 (×6): qty 3

## 2019-06-13 MED ORDER — FUROSEMIDE 10 MG/ML IJ SOLN: 40.0000 mg | Freq: Once | INTRAMUSCULAR | Status: DC

## 2019-06-13 MED ORDER — FUROSEMIDE 10 MG/ML IJ SOLN: 20.0000 mg | Freq: Once | INTRAMUSCULAR | Status: DC

## 2019-06-13 MED ORDER — ALBUMIN HUMAN 25 % IV SOLN
25.0000 g | Freq: Four times a day (QID) | INTRAVENOUS | Status: AC
  Administered 2019-06-13 – 2019-06-14 (×4): 25 g via INTRAVENOUS
  Filled 2019-06-13 (×4): qty 100

## 2019-06-13 MED ORDER — NOREPINEPHRINE 4 MG/250ML-% IV SOLN: 0.0000 ug/min | INTRAVENOUS | Status: DC

## 2019-06-13 MED ORDER — SODIUM CHLORIDE 0.9 % IV BOLUS: 250.0000 mL | Freq: Once | INTRAVENOUS | Status: DC

## 2019-06-13 MED ORDER — FUROSEMIDE 10 MG/ML IJ SOLN
40.0000 mg | Freq: Once | INTRAMUSCULAR | Status: AC
  Administered 2019-06-13: 40 mg via INTRAVENOUS
  Filled 2019-06-13: qty 4

## 2019-06-13 MED ORDER — SODIUM CHLORIDE 0.9 % IV SOLN
0.0000 ug/min | INTRAVENOUS | Status: DC
  Administered 2019-06-13: 2 ug/min via INTRAVENOUS
  Filled 2019-06-13 (×2): qty 4

## 2019-06-13 MED ORDER — ALPRAZOLAM 0.25 MG PO TABS
0.2500 mg | ORAL_TABLET | Freq: Two times a day (BID) | ORAL | Status: DC | PRN
  Administered 2019-06-13 – 2019-06-19 (×8): 0.25 mg via ORAL
  Filled 2019-06-13 (×8): qty 1

## 2019-06-13 NOTE — Progress Notes (Signed)
LE venous duplex       has been completed. Preliminary results can be found under CV proc through chart review. Nickie Warwick, BS, RDMS, RVT   

## 2019-06-13 NOTE — Consult Note (Signed)
Cardiology Consultation:   Patient ID: Tonya Bradley MRN: [id number]; DOB: [birth year]  Admit date: 06/12/2019 Date of Consult: 06/13/2019  Primary Care Provider: Martinique, [name], MD Primary Cardiologist: Harl Bowie  Primary Electrophysiologist:  Lovena Le    Patient Profile:   Tonya Bradley is a 68 y.o. female with a hx of chronic diastolic congestive heart failure, atrial flutter, coronary artery disease, COPD, small cell lung cancer, tobacco abuse who is being seen today for the evaluation of acute on chronic diastolic congestive heart failure at the request of  Dr. Horris Latino .  History of Present Illness:   Tonya Bradley is a 68 year old female who is followed by Dr. Harl Bowie and Dr. Lovena Le.  She has known history of chronic diastolic congestive heart failure, atrial flutter, coronary artery disease, oxygen dependent COPD, small cell lung cancer.  She presented to the [facility] long emergency room with several days of lethargy and weakness.  She is currently undergoing chemotherapy with Dr. Earlie Server.  On presentation to the emergency room she was found to be hypotensive and also hypoxic with her O2 saturations in the 80s. Covid  test was negative on June 12, 2019. B natruretic peptide was 2597.  Troponin levels were negative x2. She had acute renal insufficiency with a creatinine of 2.1.  Her baseline creatinine is 0.9.  She was given Lasix and diuresed 1.1 L.  She is breathing quite a bit better this morning.  She feels back to baseline.  She is wondering when she can go home.  Heart Pathway Score:     Past Medical History:  Diagnosis Date  . Acute on chronic diastolic (congestive) heart failure ([place]) 06/12/2019  . Anemia   . Anxiety   . Arthritis    "back"  . Atrial flutter ([place]) 03/13/2016  . CAD (coronary artery disease)   . Cancer ([place])    skin cancer  . Cataract   . Colon polyp   . Complication of anesthesia    BP dropped during esophagus diltation in 2017-admitted for 3 days.   Marland Kitchen  COPD (chronic obstructive pulmonary disease) ([place])   . Depression   . Dysrhythmia   . Emphysema of lung ([place])   . GERD (gastroesophageal reflux disease)   . Hypercholesterolemia   . Lung cancer ([place])   . Osteoporosis   . Oxygen deficiency     Past Surgical History:  Procedure Laterality Date  . A-FLUTTER ABLATION N/A 05/19/2017   Procedure: A-Flutter Ablation;  Surgeon: Evans Lance, MD;  Location: [facility];  Service: Cardiovascular;  Laterality: N/A;  . APPENDECTOMY    . BASAL CELL CARCINOMA EXCISION    . CARDIAC CATHETERIZATION    . CATARACT EXTRACTION Bilateral   . CHOLECYSTECTOMY    . COLONOSCOPY N/A 06/23/2014   Dr. Rourk:Rectal and colonic polyps-removed, Pancolonic diverticulosis. lymphocytic colitis  . CORONARY ANGIOPLASTY WITH STENT PLACEMENT    . ESOPHAGEAL DILATION N/A 03/13/2016   Procedure: ESOPHAGEAL DILATION;  Surgeon: Daneil Dolin, MD;  [facility];  Service: Endoscopy;  Laterality: N/A;  . ESOPHAGOGASTRODUODENOSCOPY N/A 03/13/2016   Procedure: ESOPHAGOGASTRODUODENOSCOPY (EGD);  Surgeon: Daneil Dolin, MD;  [facility];  Service: Endoscopy;  Laterality: N/A;  0930  . EYE SURGERY     cataract removed, bilateral  . IR THORACENTESIS ASP PLEURAL SPACE W/IMG GUIDE  02/15/2019  . LUMBAR LAMINECTOMY/DECOMPRESSION MICRODISCECTOMY N/A 01/19/2018   Procedure: L4-5 DECOMPRESSION;  Surgeon: Marybelle Killings, MD;  Location: [facility];  Service: Orthopedics;  Laterality: N/A;  . VIDEO ASSISTED THORACOSCOPY (VATS)/ LOBECTOMY Right 02/04/2019   Procedure: VIDEO ASSISTED THORACOSCOPY (VATS)/RIGHT UPPER AND MIDDLE LOBECTOMY;  Surgeon: Melrose Nakayama, MD;  Location: [facility];  Service: Thoracic;  Laterality: Right;  Marland Kitchen VIDEO BRONCHOSCOPY N/A 02/14/2019   Procedure: BRONCHOSCOPY WITH SEDATION;  Surgeon: Melrose Nakayama, MD;  Location: [facility] OR;  Service: Thoracic;  Laterality: N/A;     Home Medications:  Prior to Admission medications   Medication Sig  Start Date End Date Taking? Authorizing Provider  ALPRAZolam Duanne Moron) 0.5 MG tablet Take 1 tablet (0.5 mg total) by mouth 2 (two) times daily. 04/19/19  Yes Martinique, [name], MD  amiodarone (PACERONE) 200 MG tablet TAKE 1/2 TABLET BY MOUTH ONCE DAILY Patient taking differently: Take 100 mg by mouth daily.  03/27/19  Yes Evans Lance, MD  atorvastatin (LIPITOR) 80 MG tablet TAKE 1 TABLET(80 MG) BY MOUTH DAILY Patient taking differently: Take 80 mg by mouth daily at 6 PM.  04/28/19  Yes Martinique, [name], MD  Cholecalciferol (VITAMIN D) 2000 units CAPS Take 2,000 Units by mouth daily.   Yes [provider]  cycloSPORINE (RESTASIS) 0.05 % ophthalmic emulsion Place 1 drop into both eyes 2 (two) times daily.   Yes [provider]  dexamethasone (DECADRON) 4 MG tablet TAKE 1 TABLET(4 MG) BY MOUTH TWICE DAILY FOR 5 DAYS AFTER CHEMOTHERAPY Patient taking differently: Take 4 mg by mouth 2 (two) times daily. FOR 5 DAYS AFTER CHEMOTHERAPY 05/10/19  Yes Curt Bears, MD  Dexlansoprazole (DEXILANT) 30 MG capsule Take 1 capsule (30 mg total) by mouth daily. 02/19/19  Yes Martinique, [name], MD  diltiazem (CARDIZEM CD) 120 MG 24 hr capsule TAKE 1 CAPSULE(120 MG) BY MOUTH DAILY Patient taking differently: Take 120 mg by mouth daily.  01/08/19  Yes Martinique, [name], MD  DULoxetine (CYMBALTA) 60 MG capsule Take 1 capsule (60 mg total) by mouth daily. Patient taking differently: Take 60 mg by mouth at bedtime.  10/06/18  Yes Martinique, [name], MD  furosemide (LASIX) 40 MG tablet Take 1.5 tablets (60 mg total) by mouth 2 (two) times daily. 05/27/19  Yes Evans Lance, MD  LINZESS 290 MCG CAPS capsule TAKE 1 CAPSULE(290 MCG) BY MOUTH DAILY AS NEEDED FOR CONSTIPATION Patient taking differently: Take 290 mcg by mouth daily as needed (constipation).  06/11/19  Yes Martinique, [name], MD  loratadine (CLARITIN) 10 MG tablet Take 10 mg by mouth daily.   Yes [provider]  magnesium oxide (MAG-OX) 400 (241.3  Mg) MG tablet 1 tablet twice daily Patient taking differently: Take 400 mg by mouth 2 (two) times daily.  04/19/19  Yes Curt Bears, MD  metoCLOPramide (REGLAN) 5 MG tablet Take 1 tablet (5 mg total) by mouth 3 (three) times daily before meals. 05/11/19  Yes Melrose Nakayama, MD  metoprolol tartrate (LOPRESSOR) 25 MG tablet Take 1 tablet (25 mg total) by mouth 2 (two) times daily. 02/18/19  Yes Barrett, Erin R, PA-C  Multiple Vitamin (MULTIVITAMIN WITH MINERALS) TABS tablet Take 1 tablet by mouth daily.   Yes [provider]  ondansetron (ZOFRAN) 4 MG tablet TAKE 1 TABLET(4 MG) BY MOUTH TWICE DAILY AS NEEDED FOR NAUSEA OR VOMITING Patient taking differently: Take 4 mg by mouth 2 (two) times daily as needed for nausea or vomiting.  05/11/19  Yes Martinique, [name], MD  potassium chloride (K-DUR) 10 MEQ tablet Take 1 tablet (10 mEq total) by mouth 2 (two) times daily.  05/27/19  Yes Evans Lance, MD  prochlorperazine (COMPAZINE) 10 MG tablet TAKE 1 TABLET(10 MG) BY MOUTH EVERY 6 HOURS AS NEEDED FOR NAUSEA OR VOMITING Patient taking differently: Take 10 mg by mouth every 6 (six) hours as needed for nausea or vomiting.  05/10/19  Yes Curt Bears, MD  QUEtiapine (SEROQUEL) 100 MG tablet TAKE 1 TABLET(100 MG) BY MOUTH AT BEDTIME Patient taking differently: Take 100 mg by mouth at bedtime.  05/18/19  Yes Martinique, [name], MD  rOPINIRole (REQUIP) 0.25 MG tablet TAKE 2 TABLETS(0.5 MG) BY MOUTH AT BEDTIME Patient taking differently: Take 0.5 mg by mouth at bedtime.  03/15/19  Yes Martinique, [name], MD  TRELEGY ELLIPTA 100-62.5-25 MCG/INH AEPB INHALE 1 PUFF INTO THE LUNGS DAILY Patient taking differently: Inhale 1 puff into the lungs daily.  12/29/18  Yes Byrum, Rose Fillers, MD  VENTOLIN HFA 108 (90 Base) MCG/ACT inhaler INHALE 2 PUFFS INTO THE LUNGS EVERY 6 HOURS AS NEEDED FOR WHEEZING OR SHORTNESS OF BREATH Patient taking differently: Inhale 2 puffs into the lungs every 6 (six) hours as needed  (wheezing/shortness of breath).  09/19/17  Yes Martinique, [name], MD  guaiFENesin (MUCINEX) 600 MG 12 hr tablet Take 2 tablets (1,200 mg total) by mouth 2 (two) times daily. For 7 days, then use as needed Patient not taking: Reported on 06/12/2019 02/18/19   Barrett, Lodema Hong, PA-C  nitroGLYCERIN (NITROSTAT) 0.4 MG SL tablet Place 1 tablet (0.4 mg total) under the tongue every 5 (five) minutes as needed for chest pain. Reported on 06/12/2016 Patient not taking: Reported on 05/26/2019 10/22/18   Martinique, [name], MD    Inpatient Medications: Scheduled Meds: . atorvastatin  80 mg Oral q1800  . Chlorhexidine Gluconate Cloth  6 each Topical Daily  . cycloSPORINE  1 drop Both Eyes BID  . DULoxetine  60 mg Oral QHS  . fluticasone furoate-vilanterol  1 puff Inhalation Daily   And  . umeclidinium bromide  1 puff Inhalation Daily  . furosemide  40 mg Intravenous Q12H  . heparin  5,000 Units Subcutaneous Q8H  . ipratropium-albuterol  3 mL Nebulization Q6H  . loratadine  10 mg Oral Daily  . mouth rinse  15 mL Mouth Rinse BID  . metoCLOPramide  5 mg Oral TID AC  . nicotine  21 mg Transdermal Q24H  . pantoprazole  40 mg Oral Daily  . QUEtiapine  100 mg Oral QHS  . rOPINIRole  0.5 mg Oral QHS   Continuous Infusions: . norepinephrine (LEVOPHED) Adult infusion 3 mcg/min (06/13/19 0403)   PRN Meds: acetaminophen **OR** acetaminophen, albuterol, ondansetron **OR** ondansetron (ZOFRAN) IV, prochlorperazine, senna-docusate  Allergies:    Allergies  Allergen Reactions  . Morphine And Related Other (See Comments)    hallucinations    Social History:   Social History   Socioeconomic History  . Marital status: Married    Spouse name: Not on file  . Number of children: 3  . Years of education: 10  . Highest education level: Not on file  Occupational History  . Occupation: retired    Comment: customer service [employer]  . Financial resource strain: Not on file  . Food insecurity     Worry: Not on file    Inability: Not on file  . Transportation needs    Medical: Not on file    Non-medical: Not on file  Tobacco Use  . Smoking status: Current Every Day Smoker    Packs/day: 0.25  Years: 54.00    Pack years: 13.50    Types: Cigarettes    Start date: 06/14/1966  . Smokeless tobacco: Never Used  . Tobacco comment: 1-2 packs daily. Chantix too expensive; pt states that she quit 2 weeks ago (02/04/19)   Substance and Sexual Activity  . Alcohol use: No    Alcohol/week: 0.0 standard drinks  . Drug use: No  . Sexual activity: Yes    Birth control/protection: Post-menopausal  Lifestyle  . Physical activity    Days per week: Not on file    Minutes per session: Not on file  . Stress: Not on file  Relationships  . Social Herbalist on phone: Not on file    Gets together: Not on file    Attends religious service: Not on file    Active member of club or organization: Not on file    Attends meetings of clubs or organizations: Not on file    Relationship status: Not on file  . Intimate partner violence    Fear of current or ex partner: Not on file    Emotionally abused: Not on file    Physically abused: Not on file    Forced sexual activity: Not on file  Other Topics Concern  . Not on file  Social History Narrative   Quit high school and got married in 10th grade, age 74   First husband died of colon cancer at young age   Married again 2018   Lives in own home   Daughter lives with her   Drinks coffee seldom, too much "sweet tea"    Family History:    Family History  Problem Relation Age of Onset  . Breast cancer Sister 74  . Heart disease Sister   . Heart attack Father   . Heart disease Father   . Stroke Mother   . Heart disease Mother   . Hyperlipidemia Mother   . Heart attack Sister   . Heart disease Brother        ?heart failure  . Congestive Heart Failure Brother   . Hypertension Son   . Heart disease Maternal Grandfather   . Cancer  Cousin        lymphoma  . Hypertension Son   . Stomach cancer Other   . Colon cancer Neg Hx   . Pancreatic cancer Neg Hx      ROS:  Please see the history of present illness.   All other ROS reviewed and negative.     Physical Exam/Data:   Vitals:   06/13/19 0700 06/13/19 0713 06/13/19 0809 06/13/19 0847  BP: (!) 110/52     Pulse: 92   94  Resp: 15   17  Temp:  98.3 F (36.8 C)    TempSrc:  Oral    SpO2: 100%  95% (!) 88%  Weight:      Height:        Intake/Output Summary (Last 24 hours) at 06/13/2019 0915 Last data filed at 06/13/2019 0553 Gross per 24 hour  Intake 37.96 ml  Output 1200 ml  Net -1162.04 ml   Last 3 Weights 06/12/2019 06/12/2019 05/26/2019  Weight (lbs) 145 lb 4.5 oz 134 lb 144 lb 11.2 oz  Weight (kg) 65.9 kg 60.782 kg 65.635 kg     Body mass index is 26.57 kg/m.  General: Chronically ill-appearing female, no acute distress HEENT: normal Lymph: no adenopathy Neck: no JVD Endocrine:  No thryomegaly Vascular: No carotid bruits;  FA pulses 2+ bilaterally without bruits  Cardiac:  normal S1, S2; RRR; no murmur  Lungs:  clear to auscultation bilaterally, no wheezing, rhonchi or rales  Abd: soft, nontender, no hepatomegaly  Ext: no edema Musculoskeletal:  No deformities, BUE and BLE strength normal and equal Skin: warm and dry  Neuro:  CNs 2-12 intact, no focal abnormalities noted Psych:  Normal affect   EKG:  The EKG was personally reviewed and demonstrates:  NSR ,  Left axis deviation  Telemetry:  Telemetry was personally reviewed and demonstrates:   NSR   Relevant CV Studies:   Laboratory Data:  High Sensitivity Troponin:   Recent Labs  Lab 06/12/19 1649 06/12/19 1912  TROPONINIHS 5.0 6.0     Cardiac EnzymesNo results for input(s): TROPONINI in the last 168 hours. No results for input(s): TROPIPOC in the last 168 hours.  Chemistry Recent Labs  Lab 06/09/19 1154 06/12/19 0845 06/13/19 0243  NA 140 140 143  K 2.7* 4.7 3.9  CL 91*  98 95*  CO2 38* 30 33*  GLUCOSE 143* 98 169*  BUN 18 39* 32*  CREATININE 0.93 2.10* 1.18*  CALCIUM 9.0 8.5* 8.9  GFRNONAA >60 23* 47*  GFRAA >60 27* 54*  ANIONGAP 11 12 15     Recent Labs  Lab 06/09/19 1154 06/12/19 0845  PROT 6.1* 5.6*  ALBUMIN 3.1* 2.9*  AST 22 34  ALT 22 26  ALKPHOS 187* 163*  BILITOT 0.2* 0.3   Hematology Recent Labs  Lab 06/09/19 1154 06/12/19 0845 06/13/19 0243  WBC 15.7* 16.9* 22.7*  RBC 2.91* 2.76* 3.07*  HGB 8.2* 7.8* 8.8*  HCT 25.8* 26.0* 28.7*  MCV 88.7 94.2 93.5  MCH 28.2 28.3 28.7  MCHC 31.8 30.0 30.7  RDW 17.9* 19.9* 20.1*  PLT 63* 128* 227   BNP Recent Labs  Lab 06/12/19 0845  BNP 2,597.8*    DDimer No results for input(s): DDIMER in the last 168 hours.   Radiology/Studies:  Nm Pulmonary Perfusion  Result Date: 06/12/2019 CLINICAL DATA:  Shortness of breath EXAM: NUCLEAR MEDICINE PERFUSION LUNG SCAN TECHNIQUE: Perfusion images were obtained in multiple projections after intravenous injection of radiopharmaceutical. Ventilation scans intentionally deferred if perfusion scan and chest x-ray adequate for interpretation during COVID 19 epidemic. RADIOPHARMACEUTICALS:  4.17 mCi Tc-49m MAA IV COMPARISON:  Chest x-ray from earlier in the same day. FINDINGS: Adequate perfusion is noted throughout both lungs without focal defect to suggest pulmonary embolism. Elevation of the right hemidiaphragm is noted. IMPRESSION: No focal perfusion defect to suggest pulmonary embolism. Electronically Signed   By: Inez Catalina M.D.   On: 06/12/2019 23:04   Dg Chest Port 1 View  Result Date: 06/12/2019 CLINICAL DATA:  Pain following fall EXAM: PORTABLE CHEST 1 VIEW COMPARISON:  Apr 18, 2019 FINDINGS: There is postoperative change on the right with volume loss and elevation of the right hemidiaphragm. There is no edema or consolidation. Heart is mildly enlarged with pulmonary vascularity within normal limits. No adenopathy. There is aortic atherosclerosis.  No bone lesions. IMPRESSION: Postoperative change on the right with volume loss the. No edema or consolidation. Stable cardiac silhouette. Aortic Atherosclerosis (ICD10-I70.0). Electronically Signed   By: Lowella Grip III M.D.   On: 06/12/2019 09:15    Assessment and Plan:   1. Acute on chronic diastolic congestive heart failure: The patient admits to eating fair amount of salty and processed foods.  She eats processed meats about once a day.  She does take her medications.  I have advised her to greatly decreased the processed foods and salt that she gets in her diet.  We will continue home medications.   Dr. Lovena Le saw her by telemedicine visit on June 25 and noted that she was having increased weight gain and increased swelling.  The Lasix had been increased to 60 mg twice a day but this apparently did not result in significant diuresis.  At discharge I would start her on torsemide 40 mg a day and see if that works better than the Lasix.  2.  Coronary artery disease: She is not having any episodes of angina.  Continue diltiazem 120 mg a day, metoprolol 25 mg twice a day  3.  History of atrial flutter: Continue amiodarone 100 mg a day.  4.  Hyperlipidemia: Continue atorvastatin 80 mg a day.      For questions or updates, please contact [place] Please consult [email or web address] for contact info under     Signed, Mertie Moores, MD  06/13/2019 9:15 AM

## 2019-06-13 NOTE — Progress Notes (Signed)
Marland Kitchen  PROGRESS NOTE    Tonya Bradley  ZOX:[id number] DOB: [birth year] DOA: 06/12/2019 PCP: [place], [name], MD   Brief Narrative:   Tonya Bradley is a 68 y.o. female with medical history significant for diastolic heart failure, atrial flutter, CAD, COPD oxygen dependent, small cell lung cancer, tobacco abuse, presented to the ED complaining of lethargy which is been ongoing past couple of days, bilateral lower extremity edema which has been ongoing for weeks.  Of note, patient is currently undergoing chemotherapy with Dr. Earlie Server.  Patient has chronic dyspnea, noted to be requiring more oxygen. Patient denies any chest pain, cough, nausea/vomiting, abdominal pain, diarrhea, fever/chills.   Assessment & Plan:   Principal Problem:   Acute on chronic diastolic (congestive) heart failure (HCC) Active Problems:   Atrial flutter (HCC)   CAD (coronary artery disease)   Tobacco abuse   Chronic hypoxemic respiratory failure (HCC)   COPD (chronic obstructive pulmonary disease) (HCC)   Small cell lung cancer, right (HCC)   Acute on chronic hypoxic respiratory failure     - Patient noted to desaturate to as low 70s 80s on 2 L (uses 2L chronically)     - at admission she clinically looked overloaded     - BNP noted to be 2597, troponin negative, EKG with no acute ST changes     - Chest x-ray showed no consolidation or edema     - Echo done on 03/2016 showed EF of 55 to 60%, repeat echo: normal systolic function, with an ejection fraction of 60-65%; right ventricle has normal systolic function; Right ventricular systolic pressure could not be assessed; There is no evidence of pericardial effusion.     - CTA chest ordered, dopplers negative     - cardiology onboard; s/p lasix IV     - strict I&O's, daily weights     - required HFNC ON, but is on 4 - 6L [place] at this time  AKI     - Renal vascular congestion/cardiorenal/??unlikely intravascular volume depletion     - Daily BMP     - SCr is improved  this AM at 1.18  Hypotension     - Monitor closely in SDU     - required levephed last night and remains on it this afternoon     - have asked PCCM for back up  Chronic leukocytosis     - Ongoing, around baseline     - Daily CBC  Anemia of chronic disease from malignancy     - Likely from chemotherapy, denies any GI bleeding     - FOBT pending  Thrombocytopenia     - Chronic, ongoing, monitor  Atrial flutter     - Rate controlled     - Not on any anticoagulation     - Held PTA amiodarone, due to low BP     - cardiology onboard  COPD     - Continue supplemental oxygen, inhalers  Small cell lung cancer     - Currently on chemotherapy     - Followed by Dr. Earlie Server  GERD     - PPI  Tobacco abuse     - Advised to quit     - Nicotine patch  Depression/anxiety     - Continue quetiapine, Cymbalta  Sepsis     - unknown cause     - Bld Cx ordered     - CTA PE ordered     - vanc/cefepime; currently on levephed     -  she's afebrile at this time  Acute on chronic diastolic HF     - BMP 6222     - received lasix IV; hold for now     - cardiology onboard, appreciate assistance   DVT prophylaxis: heparin Code Status: DNR Disposition Plan: TBD   Consultants:   Cardiology  PCCM  Antimicrobials:  . Cefepime, Vanc    Subjective: "Can I go home?"  Objective: Vitals:   06/13/19 1130 06/13/19 1135 06/13/19 1200 06/13/19 1341  BP: (!) 76/31 119/65    Pulse: 97 98    Resp: 16 16    Temp:   98.1 F (36.7 C)   TempSrc:   Oral   SpO2: 96% 97%  99%  Weight:      Height:        Intake/Output Summary (Last 24 hours) at 06/13/2019 1500 Last data filed at 06/13/2019 1327 Gross per 24 hour  Intake 97.96 ml  Output 1325 ml  Net -1227.04 ml   Filed Weights   06/12/19 0829 06/12/19 1633  Weight: 60.8 kg 65.9 kg    Examination:  General: 67 y.o. female resting in bed in NAD Cardiovascular: RRR, +S1, S2, no m/g/r, equal pulses throughout  Respiratory: clear anteriorly but decreased air movement, no accessory muscle use GI: BS+, NDNT, no masses noted, no organomegaly noted MSK: No c/c; slight pedal edema b/l Skin: No rashes, bruises, ulcerations noted Neuro: A&O x 3, no focal deficits Psyc: Appropriate interaction and affect, calm/cooperative     Data Reviewed: I have personally reviewed following labs and imaging studies.  CBC: Recent Labs  Lab 06/09/19 1154 06/12/19 0845 06/13/19 0243  WBC 15.7* 16.9* 22.7*  NEUTROABS 9.3* 11.5*  --   HGB 8.2* 7.8* 8.8*  HCT 25.8* 26.0* 28.7*  MCV 88.7 94.2 93.5  PLT 63* 128* 979   Basic Metabolic Panel: Recent Labs  Lab 06/09/19 1154 06/12/19 0845 06/13/19 0243  NA 140 140 143  K 2.7* 4.7 3.9  CL 91* 98 95*  CO2 38* 30 33*  GLUCOSE 143* 98 169*  BUN 18 39* 32*  CREATININE 0.93 2.10* 1.18*  CALCIUM 9.0 8.5* 8.9  MG 1.5* 2.2  --    GFR: Estimated Creatinine Clearance: 40.1 mL/min (A) (by C-G formula based on SCr of 1.18 mg/dL (H)). Liver Function Tests: Recent Labs  Lab 06/09/19 1154 06/12/19 0845  AST 22 34  ALT 22 26  ALKPHOS 187* 163*  BILITOT 0.2* 0.3  PROT 6.1* 5.6*  ALBUMIN 3.1* 2.9*   No results for input(s): LIPASE, AMYLASE in the last 168 hours. Recent Labs  Lab 06/12/19 0851  AMMONIA 25   Coagulation Profile: No results for input(s): INR, PROTIME in the last 168 hours. Cardiac Enzymes: No results for input(s): CKTOTAL, CKMB, CKMBINDEX, TROPONINI in the last 168 hours. BNP (last 3 results) No results for input(s): PROBNP in the last 8760 hours. HbA1C: No results for input(s): HGBA1C in the last 72 hours. CBG: No results for input(s): GLUCAP in the last 168 hours. Lipid Profile: No results for input(s): CHOL, HDL, LDLCALC, TRIG, CHOLHDL, LDLDIRECT in the last 72 hours. Thyroid Function Tests: No results for input(s): TSH, T4TOTAL, FREET4, T3FREE, THYROIDAB in the last 72 hours. Anemia Panel: No results for input(s): VITAMINB12,  FOLATE, FERRITIN, TIBC, IRON, RETICCTPCT in the last 72 hours. Sepsis Labs: No results for input(s): PROCALCITON, LATICACIDVEN in the last 168 hours.  Recent Results (from the past 240 hour(s))  SARS Coronavirus 2 (CEPHEID - Performed in [facility] hospital lab), Hosp Order     Status: None   Collection Time: 06/12/19 10:59 AM   Specimen: Nasopharyngeal Swab  Result Value Ref Range Status   SARS Coronavirus 2 NEGATIVE NEGATIVE Final    Comment: (NOTE) If result is NEGATIVE SARS-CoV-2 target nucleic acids are NOT DETECTED. The SARS-CoV-2 RNA is generally detectable in upper and lower  respiratory specimens during the acute phase of infection. The lowest  concentration of SARS-CoV-2 viral copies this assay can detect is 250  copies / mL. A negative result does not preclude SARS-CoV-2 infection  and should not be used as the sole basis for treatment or other  patient management decisions.  A negative result may occur with  improper specimen collection / handling, submission of specimen other  than nasopharyngeal swab, presence of viral mutation(s) within the  areas targeted by this assay, and inadequate number of viral copies  (<250 copies / mL). A negative result must be combined with clinical  observations, patient history, and epidemiological information. If result is POSITIVE SARS-CoV-2 target nucleic acids are DETECTED. The SARS-CoV-2 RNA is generally detectable in upper and lower  respiratory specimens dur ing the acute phase of infection.  Positive  results are indicative of active infection with SARS-CoV-2.  Clinical  correlation with patient history and other diagnostic information is  necessary to determine patient infection status.  Positive results do  not rule out bacterial infection or co-infection with other viruses. If result is PRESUMPTIVE POSTIVE SARS-CoV-2 nucleic acids MAY BE PRESENT.   A presumptive positive result was obtained on the submitted specimen  and  confirmed on repeat testing.  While 2019 novel coronavirus  (SARS-CoV-2) nucleic acids may be present in the submitted sample  additional confirmatory testing may be necessary for epidemiological  and / or clinical management purposes  to differentiate between  SARS-CoV-2 and other Sarbecovirus currently known to infect humans.  If clinically indicated additional testing with an alternate test  methodology [phone]) is advised. The SARS-CoV-2 RNA is generally  detectable in upper and lower respiratory sp ecimens during the acute  phase of infection. The expected result is Negative. Fact Sheet for Patients:  StrictlyIdeas.no Fact Sheet for Healthcare Providers: BankingDealers.co.za This test is not yet approved or cleared by the [place] FDA and has been authorized for detection and/or diagnosis of SARS-CoV-2 by FDA under an Emergency Use Authorization (EUA).  This EUA will remain in effect (meaning this test can be used) for the duration of the COVID-19 declaration under Section 564(b)(1) of the Act, 21 U.S.C. section 360bbb-3(b)(1), unless the authorization is terminated or revoked sooner. Performed at [facility], [place] [address], [place], [place] [address]   MRSA PCR Screening     Status: None   Collection Time: 06/12/19  7:30 PM   Specimen: Nasal Mucosa; Nasopharyngeal  Result Value Ref Range Status   MRSA by PCR NEGATIVE NEGATIVE Final    Comment:        The GeneXpert MRSA Assay (FDA approved for NASAL specimens only), is one component of a comprehensive MRSA colonization surveillance program. It is not intended to diagnose MRSA infection nor to guide or monitor treatment for MRSA infections. Performed at [facility], [place] [address] [place], [place] [address]          Radiology Studies: Nm Pulmonary Perfusion  Result Date: 06/12/2019 CLINICAL DATA:  Shortness of breath  EXAM: NUCLEAR MEDICINE PERFUSION LUNG SCAN TECHNIQUE: Perfusion images were obtained in multiple projections  after intravenous injection of radiopharmaceutical. Ventilation scans intentionally deferred if perfusion scan and chest x-ray adequate for interpretation during COVID 19 epidemic. RADIOPHARMACEUTICALS:  4.17 mCi Tc-42m MAA IV COMPARISON:  Chest x-ray from earlier in the same day. FINDINGS: Adequate perfusion is noted throughout both lungs without focal defect to suggest pulmonary embolism. Elevation of the right hemidiaphragm is noted. IMPRESSION: No focal perfusion defect to suggest pulmonary embolism. Electronically Signed   By: Inez Catalina M.D.   On: 06/12/2019 23:04   Dg Chest Port 1 View  Result Date: 06/12/2019 CLINICAL DATA:  Pain following fall EXAM: PORTABLE CHEST 1 VIEW COMPARISON:  Apr 18, 2019 FINDINGS: There is postoperative change on the right with volume loss and elevation of the right hemidiaphragm. There is no edema or consolidation. Heart is mildly enlarged with pulmonary vascularity within normal limits. No adenopathy. There is aortic atherosclerosis. No bone lesions. IMPRESSION: Postoperative change on the right with volume loss the. No edema or consolidation. Stable cardiac silhouette. Aortic Atherosclerosis (ICD10-I70.0). Electronically Signed   By: Lowella Grip III M.D.   On: 06/12/2019 09:15   Vas [place] Lower Extremity Venous (dvt)  Result Date: 06/13/2019  Lower Venous Study Indications: Edema.  Risk Factors: VQ scan negative for PE. Comparison Study: no prior Performing Technologist: June Leap RDMS, RVT  Examination Guidelines: A complete evaluation includes B-mode imaging, spectral Doppler, color Doppler, and power Doppler as needed of all accessible portions of each vessel. Bilateral testing is considered an integral part of a complete examination. Limited examinations for reoccurring indications may be performed as noted.   +---------+---------------+---------+-----------+----------+-------+ RIGHT    CompressibilityPhasicitySpontaneityPropertiesSummary +---------+---------------+---------+-----------+----------+-------+ CFV      Full           Yes      Yes                          +---------+---------------+---------+-----------+----------+-------+ SFJ      Full                                                 +---------+---------------+---------+-----------+----------+-------+ FV Prox  Full                                                 +---------+---------------+---------+-----------+----------+-------+ FV Mid   Full                                                 +---------+---------------+---------+-----------+----------+-------+ FV DistalFull                                                 +---------+---------------+---------+-----------+----------+-------+ PFV      Full                                                 +---------+---------------+---------+-----------+----------+-------+ POP  Full           Yes      Yes                          +---------+---------------+---------+-----------+----------+-------+ PTV      Full                                                 +---------+---------------+---------+-----------+----------+-------+ PERO     Full                                                 +---------+---------------+---------+-----------+----------+-------+   +---------+---------------+---------+-----------+----------+-------+ LEFT     CompressibilityPhasicitySpontaneityPropertiesSummary +---------+---------------+---------+-----------+----------+-------+ CFV      Full           Yes      Yes                          +---------+---------------+---------+-----------+----------+-------+ SFJ      Full                                                 +---------+---------------+---------+-----------+----------+-------+ FV Prox  Full                                                  +---------+---------------+---------+-----------+----------+-------+ FV Mid   Full                                                 +---------+---------------+---------+-----------+----------+-------+ FV DistalFull                                                 +---------+---------------+---------+-----------+----------+-------+ PFV      Full                                                 +---------+---------------+---------+-----------+----------+-------+ POP      Full           Yes      Yes                          +---------+---------------+---------+-----------+----------+-------+ PTV      Full                                                 +---------+---------------+---------+-----------+----------+-------+ PERO     Full                                                 +---------+---------------+---------+-----------+----------+-------+  Summary: Right: There is no evidence of deep vein thrombosis in the lower extremity. No cystic structure found in the popliteal fossa. Left: There is no evidence of deep vein thrombosis in the lower extremity. No cystic structure found in the popliteal fossa.  *See table(s) above for measurements and observations.    Preliminary         Scheduled Meds: . atorvastatin  80 mg Oral q1800  . Chlorhexidine Gluconate Cloth  6 each Topical Daily  . cycloSPORINE  1 drop Both Eyes BID  . DULoxetine  60 mg Oral QHS  . fluticasone furoate-vilanterol  1 puff Inhalation Daily   And  . umeclidinium bromide  1 puff Inhalation Daily  . furosemide  40 mg Intravenous Q12H  . heparin  5,000 Units Subcutaneous Q8H  . ipratropium-albuterol  3 mL Nebulization Q6H  . loratadine  10 mg Oral Daily  . mouth rinse  15 mL Mouth Rinse BID  . metoCLOPramide  5 mg Oral TID AC  . nicotine  21 mg Transdermal Q24H  . pantoprazole  40 mg Oral Daily  . QUEtiapine  100 mg Oral QHS  .  rOPINIRole  0.5 mg Oral QHS   Continuous Infusions: . norepinephrine (LEVOPHED) Adult infusion 3 mcg/min (06/13/19 1136)     LOS: 1 day    Time spent: 60 minutes spent in the coordination of care today.    Jonnie Finner, DO [facility] Pager [phone]  If 7PM-7AM, please contact night-coverage [email or web address] Password [email or web address] 06/13/2019, 3:00 PM

## 2019-06-13 NOTE — Progress Notes (Signed)
Pt resting comfortably, HR95, rr17, spo2 93% on 3lnc.  No increased wob/respiratory distress noted or voiced by pt.  Bipap not indicated at this time.

## 2019-06-13 NOTE — Consult Note (Signed)
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   Noted to be needing more O2 with elevated BNP and Cr in ER.  Given lasix with about 1L out and improved renal indices.  CXR, troponins, echo benign.  Sometime overnight started having pressor requirement of unclear etiology.  Also has leukocytosis with left shift, she is about 14 days out from a filgastrim injection.  Empiric HCAP medications have been started and a CTA has been ordered to eval lung parenchyma and r/o PE.    At time of evaluation, patient's only complaints are a chronic cough and mild chronic wheezing.  She states all other symptoms are at baseline.  Of note, patient has been taking multiple rounds of prednisone with her chemotherapy.  Past Medical History  -HFPEF -CAD -SCLC -COPD on HOT -Osteoporosis -GERD -HLD -N/V related to chemo -Aflutter -Prior issue in 2017 with hypotension after EGD: self resolved but was noted to have Aflutter paroxysmal  Significant Hospital Events   06/12/11 admitted  Consults:  PCCM  Procedures:  NA  Significant Diagnostic Tests:  CXR R volume loss no edema or infiltrates NM scan low prob Echo WNL  Micro Data:  Blood cultures 7/12>> MRSA swab neg  Antimicrobials:  Cefepime 7/12>>> Vanc 7/12>>>   Interim history/subjective:  Consulted  Objective   Blood pressure (!) 116/50, pulse 100, temperature 98.1 F (36.7 C), temperature source Oral, resp. rate 19, height 5\' 2"   (1.575 m), weight 65.9 kg, SpO2 93 %.        Intake/Output Summary (Last 24 hours) at 06/13/2019 1639 Last data filed at 06/13/2019 1500 Gross per 24 hour  Intake 193.79 ml  Output 1325 ml  Net -1131.21 ml   Filed Weights   06/12/19 0829 06/12/19 1633  Weight: 60.8 kg 65.9 kg    Examination: GEN: elderly woman in NAD HEENT: wig noted, MMM, no thrush CV: RRR, ext warm PULM: Diminished R base, occasional wheezing otherwise clear GI: Soft, +BS EXT: No edema NEURO: Moves all 4 ext to command PSYCH: Fair insight, flat affect SKIN: No rashes  Resolved Hospital Problem list    Assessment & Plan:  # Hypotension # Leukocytosis # Elevated BNP # Abnormal CTA chest # Hx limited stage SCLC on chemo including intermitent chemo and filgrastim # Acute on chronic hypoxemia now near baseline # LE edema resolved # HFPEF  Reviewing CTA, looks like she just has some atelectasis, worst at left base and I suspect related to enlarged pulmonary artery branches encroaching on bronchioles in that area (as well as poor mobility).  She has a generous PA likely related to her chronic hypoxemia and emphysema.  Her leukocytosis can be explained by recent steroid and filgastim use.    Regarding hypotension, unclear cause, perhaps related to lasix and relative volume depletion.  Her almost chronic steroid use raises a question of relative adrenal insufficiency although I would expect her fingersticks to be lower if this was the case.  No clear  signs of infection although she does say she has some dysuria today.  My recommendations would be as follows: - Check procalcitonin, if < 0.5, stop abx - Check AM cortisol level, if low may warrant trial of PO hydrocortisone - Albumin 25% 100g over 24h, fine to continue lasix with this - Monitor Cr/K/Mg and adjust repletion/ diuresis as usual - Hold her BB and diltiazem - breo/incruse/nebs/IS/CPT/flutter, hopefully OOB once off pressors - I have suspicion she  will be off pressors by morning with a negative workup  Best practice:  Diet: cardiac Pain/Anxiety/Delirium protocol (if indicated): home meds VAP protocol (if indicated): NA DVT prophylaxis: heparin GI prophylaxis: PTA PPI Glucose control: NA Mobility: bedrest for now Code Status: DNR Family Communication: Per primary Disposition: ICU pending liberation from pressors  Labs   CBC: Recent Labs  Lab 06/09/19 1154 06/12/19 0845 06/13/19 0243  WBC 15.7* 16.9* 22.7*  NEUTROABS 9.3* 11.5*  --   HGB 8.2* 7.8* 8.8*  HCT 25.8* 26.0* 28.7*  MCV 88.7 94.2 93.5  PLT 63* 128* 809    Basic Metabolic Panel: Recent Labs  Lab 06/09/19 1154 06/12/19 0845 06/13/19 0243  NA 140 140 143  K 2.7* 4.7 3.9  CL 91* 98 95*  CO2 38* 30 33*  GLUCOSE 143* 98 169*  BUN 18 39* 32*  CREATININE 0.93 2.10* 1.18*  CALCIUM 9.0 8.5* 8.9  MG 1.5* 2.2  --    GFR: Estimated Creatinine Clearance: 40.1 mL/min (A) (by C-G formula based on SCr of 1.18 mg/dL (H)). Recent Labs  Lab 06/09/19 1154 06/12/19 0845 06/13/19 0243  WBC 15.7* 16.9* 22.7*    Liver Function Tests: Recent Labs  Lab 06/09/19 1154 06/12/19 0845  AST 22 34  ALT 22 26  ALKPHOS 187* 163*  BILITOT 0.2* 0.3  PROT 6.1* 5.6*  ALBUMIN 3.1* 2.9*   No results for input(s): LIPASE, AMYLASE in the last 168 hours. Recent Labs  Lab 06/12/19 0851  AMMONIA 25    ABG    Component Value Date/Time   PHART 7.321 (L) 02/05/2019 0452   PCO2ART 57.2 (H) 02/05/2019 0452   PO2ART 62.2 (L) 02/05/2019 0452   HCO3 34.8 (H) 04/18/2019 1543   TCO2 36 (H) 04/18/2019 1543   O2SAT 93.0 04/18/2019 1543     Coagulation Profile: No results for input(s): INR, PROTIME in the last 168 hours.  Cardiac Enzymes: No results for input(s): CKTOTAL, CKMB, CKMBINDEX, TROPONINI in the last 168 hours.  HbA1C: No results found for: HGBA1C  CBG: No results for input(s): GLUCAP in the last 168 hours.  Review of Systems:    Positive Symptoms in  bold:  Constitutional fevers, chills, weight loss, fatigue, anorexia, malaise  Eyes decreased vision, double vision, eye irritation  Ears, Nose, Mouth, Throat sore throat, trouble swallowing, sinus congestion  Cardiovascular chest pain, paroxysmal nocturnal dyspnea, lower ext edema, palpitations   Respiratory SOB, cough, DOE, hemoptysis, wheezing  Gastrointestinal nausea, vomiting, diarrhea  Genitourinary burning with urination, trouble urinating  Musculoskeletal joint aches, joint swelling, back pain  Integumentary  rashes, skin lesions  Neurological focal weakness, focal numbness, trouble speaking, headaches  Psychiatric depression, anxiety, confusion  Endocrine polyuria, polydipsia, cold intolerance, heat intolerance  Hematologic abnormal bruising, abnormal bleeding, unexplained nose bleeds  Allergic/Immunologic recurrent infections, hives, swollen lymph nodes     Past Medical History  She,  has a past medical history of Acute on chronic diastolic (congestive) heart failure ([place]) (06/12/2019), Anemia, Anxiety, Arthritis, Atrial flutter ([place]) (03/13/2016), CAD (coronary artery  disease), Cancer ([place]), Cataract, Colon polyp, Complication of anesthesia, COPD (chronic obstructive pulmonary disease) ([place]), Depression, Dysrhythmia, Emphysema of lung ([place]), GERD (gastroesophageal reflux disease), Hypercholesterolemia, Lung cancer ([place]), Osteoporosis, and Oxygen deficiency.   Surgical History    Past Surgical History:  Procedure Laterality Date  . A-FLUTTER ABLATION N/A 05/19/2017   Procedure: A-Flutter Ablation;  Surgeon: Evans Lance, MD;  Location: [facility];  Service: Cardiovascular;  Laterality: N/A;  . APPENDECTOMY    . BASAL CELL CARCINOMA EXCISION    . CARDIAC CATHETERIZATION    . CATARACT EXTRACTION Bilateral   . CHOLECYSTECTOMY    . COLONOSCOPY N/A 06/23/2014   Dr. Rourk:Rectal and colonic polyps-removed, Pancolonic diverticulosis. lymphocytic colitis  . CORONARY  ANGIOPLASTY WITH STENT PLACEMENT    . ESOPHAGEAL DILATION N/A 03/13/2016   Procedure: ESOPHAGEAL DILATION;  Surgeon: Daneil Dolin, MD;  [facility];  Service: Endoscopy;  Laterality: N/A;  . ESOPHAGOGASTRODUODENOSCOPY N/A 03/13/2016   Procedure: ESOPHAGOGASTRODUODENOSCOPY (EGD);  Surgeon: Daneil Dolin, MD;  [facility];  Service: Endoscopy;  Laterality: N/A;  0930  . EYE SURGERY     cataract removed, bilateral  . IR THORACENTESIS ASP PLEURAL SPACE W/IMG GUIDE  02/15/2019  . LUMBAR LAMINECTOMY/DECOMPRESSION MICRODISCECTOMY N/A 01/19/2018   Procedure: L4-5 DECOMPRESSION;  Surgeon: Marybelle Killings, MD;  Location: [facility];  Service: Orthopedics;  Laterality: N/A;  . VIDEO ASSISTED THORACOSCOPY (VATS)/ LOBECTOMY Right 02/04/2019   Procedure: VIDEO ASSISTED THORACOSCOPY (VATS)/RIGHT UPPER AND MIDDLE LOBECTOMY;  Surgeon: Melrose Nakayama, MD;  Location: [facility];  Service: Thoracic;  Laterality: Right;  Marland Kitchen VIDEO BRONCHOSCOPY N/A 02/14/2019   Procedure: BRONCHOSCOPY WITH SEDATION;  Surgeon: Melrose Nakayama, MD;  Location: [facility];  Service: Thoracic;  Laterality: N/A;     Social History   reports that she has been smoking cigarettes. She started smoking about 53 years ago. She has a 13.50 pack-year smoking history. She has never used smokeless tobacco. She reports that she does not drink alcohol or use drugs.   Family History   Her family history includes Breast cancer (age of onset: 50) in her sister; Cancer in her cousin; Congestive Heart Failure in her brother; Heart attack in her father and sister; Heart disease in her brother, father, maternal grandfather, mother, and sister; Hyperlipidemia in her mother; Hypertension in her son and son; Stomach cancer in an other family member; Stroke in her mother. There is no history of Colon cancer or Pancreatic cancer.   Allergies Allergies  Allergen Reactions  . Morphine And Related Other (See Comments)    hallucinations     Home  Medications  Prior to Admission medications   Medication Sig Start Date End Date Taking? Authorizing Provider  ALPRAZolam Duanne Moron) 0.5 MG tablet Take 1 tablet (0.5 mg total) by mouth 2 (two) times daily. 04/19/19  Yes Martinique, [name], MD  amiodarone (PACERONE) 200 MG tablet TAKE 1/2 TABLET BY MOUTH ONCE DAILY Patient taking differently: Take 100 mg by mouth daily.  03/27/19  Yes Evans Lance, MD  atorvastatin (LIPITOR) 80 MG tablet TAKE 1 TABLET(80 MG) BY MOUTH DAILY Patient taking differently: Take 80 mg by mouth daily at 6 PM.  04/28/19  Yes Martinique, [name], MD  Cholecalciferol (VITAMIN D) 2000 units CAPS Take 2,000 Units by mouth daily.   Yes [provider]  cycloSPORINE (RESTASIS) 0.05 % ophthalmic emulsion Place 1 drop into both eyes 2 (two) times daily.   Yes [provider]  dexamethasone (DECADRON) 4 MG tablet TAKE 1 TABLET(4 MG) BY MOUTH TWICE DAILY FOR 5 DAYS AFTER CHEMOTHERAPY Patient taking differently: Take 4 mg by mouth 2 (two) times daily. FOR 5 DAYS AFTER CHEMOTHERAPY 05/10/19  Yes Curt Bears, MD  Dexlansoprazole (DEXILANT) 30 MG capsule Take 1 capsule (30 mg total) by mouth daily. 02/19/19  Yes Martinique, [name], MD  diltiazem (CARDIZEM CD) 120 MG 24 hr capsule TAKE 1 CAPSULE(120 MG) BY MOUTH DAILY Patient taking differently: Take 120 mg by mouth daily.  01/08/19  Yes Martinique, [name], MD  DULoxetine (CYMBALTA) 60 MG capsule Take 1 capsule (60 mg total) by mouth daily. Patient taking differently: Take 60 mg by mouth at bedtime.  10/06/18  Yes Martinique, [name], MD  furosemide (LASIX) 40 MG tablet Take 1.5 tablets (60 mg total) by mouth 2 (two) times daily. 05/27/19  Yes Evans Lance, MD  LINZESS 290 MCG CAPS capsule TAKE 1 CAPSULE(290 MCG) BY MOUTH DAILY AS NEEDED FOR CONSTIPATION Patient taking differently: Take 290 mcg by mouth daily as needed (constipation).  06/11/19  Yes Martinique, [name], MD  loratadine (CLARITIN) 10 MG tablet Take 10 mg by mouth daily.   Yes  [provider]  magnesium oxide (MAG-OX) 400 (241.3 Mg) MG tablet 1 tablet twice daily Patient taking differently: Take 400 mg by mouth 2 (two) times daily.  04/19/19  Yes Curt Bears, MD  metoCLOPramide (REGLAN) 5 MG tablet Take 1 tablet (5 mg total) by mouth 3 (three) times daily before meals. 05/11/19  Yes Melrose Nakayama, MD  metoprolol tartrate (LOPRESSOR) 25 MG tablet Take 1 tablet (25 mg total) by mouth 2 (two) times daily. 02/18/19  Yes Barrett, Erin R, PA-C  Multiple Vitamin (MULTIVITAMIN WITH MINERALS) TABS tablet Take 1 tablet by mouth daily.   Yes [provider]  ondansetron (ZOFRAN) 4 MG tablet TAKE 1 TABLET(4 MG) BY MOUTH TWICE DAILY AS NEEDED FOR NAUSEA OR VOMITING Patient taking differently: Take 4 mg by mouth 2 (two) times daily as needed for nausea or vomiting.  05/11/19  Yes Martinique, [name], MD  potassium chloride (K-DUR) 10 MEQ tablet Take 1 tablet (10 mEq total) by mouth 2 (two) times daily. 05/27/19  Yes Evans Lance, MD  prochlorperazine (COMPAZINE) 10 MG tablet TAKE 1 TABLET(10 MG) BY MOUTH EVERY 6 HOURS AS NEEDED FOR NAUSEA OR VOMITING Patient taking differently: Take 10 mg by mouth every 6 (six) hours as needed for nausea or vomiting.  05/10/19  Yes Curt Bears, MD  QUEtiapine (SEROQUEL) 100 MG tablet TAKE 1 TABLET(100 MG) BY MOUTH AT BEDTIME Patient taking differently: Take 100 mg by mouth at bedtime.  05/18/19  Yes Martinique, [name], MD  rOPINIRole (REQUIP) 0.25 MG tablet TAKE 2 TABLETS(0.5 MG) BY MOUTH AT BEDTIME Patient taking differently: Take 0.5 mg by mouth at bedtime.  03/15/19  Yes Martinique, [name], MD  TRELEGY ELLIPTA 100-62.5-25 MCG/INH AEPB INHALE 1 PUFF INTO THE LUNGS DAILY Patient taking differently: Inhale 1 puff into the lungs daily.  12/29/18  Yes Byrum, Rose Fillers, MD  VENTOLIN HFA 108 (90 Base) MCG/ACT inhaler INHALE 2 PUFFS INTO THE LUNGS EVERY 6 HOURS AS NEEDED FOR WHEEZING OR SHORTNESS OF BREATH Patient taking differently: Inhale  2 puffs into the lungs every 6 (six) hours as needed (wheezing/shortness of breath).  09/19/17  Yes Martinique, [name], MD  guaiFENesin (MUCINEX) 600 MG 12 hr tablet Take 2 tablets (1,200 mg total) by mouth 2 (two) times daily.  For 7 days, then use as needed Patient not taking: Reported on 06/12/2019 02/18/19   Barrett, Lodema Hong, PA-C  nitroGLYCERIN (NITROSTAT) 0.4 MG SL tablet Place 1 tablet (0.4 mg total) under the tongue every 5 (five) minutes as needed for chest pain. Reported on 06/12/2016 Patient not taking: Reported on 05/26/2019 10/22/18   Martinique, [name], MD     Critical care time: 32 minutes

## 2019-06-13 NOTE — Progress Notes (Signed)
Pharmacy Antibiotic Note  Tonya Bradley is a 68 y.o. female admitted on 06/12/2019 with sepsis.  Pharmacy has been consulted for vanc/cefepime dosing.  Plan: 1) Vanc 1250mg  IV x 1 then 750mg  IV q24 - goal AUC 400-550 2) Cefepime 2g IV q12 per current renal funciton 3) Daily SCr  Height: 5\' 2"  (157.5 cm) Weight: 145 lb 4.5 oz (65.9 kg) IBW/kg (Calculated) : 50.1  Temp (24hrs), Avg:98.1 F (36.7 C), Min:97.9 F (36.6 C), Max:98.3 F (36.8 C)  Recent Labs  Lab 06/09/19 1154 06/12/19 0845 06/13/19 0243  WBC 15.7* 16.9* 22.7*  CREATININE 0.93 2.10* 1.18*    Estimated Creatinine Clearance: 40.1 mL/min (A) (by C-G formula based on SCr of 1.18 mg/dL (H)).    Allergies  Allergen Reactions  . Morphine And Related Other (See Comments)    hallucinations     Thank you for allowing pharmacy to be a part of this patient's care.  Kara Mead 06/13/2019 3:05 PM

## 2019-06-14 ENCOUNTER — Telehealth: Payer: Self-pay | Admitting: Internal Medicine

## 2019-06-14 ENCOUNTER — Telehealth: Payer: Self-pay | Admitting: *Deleted

## 2019-06-14 ENCOUNTER — Ambulatory Visit: Payer: Medicare HMO | Admitting: Internal Medicine

## 2019-06-14 ENCOUNTER — Other Ambulatory Visit: Payer: Medicare HMO

## 2019-06-14 ENCOUNTER — Ambulatory Visit: Payer: Medicare HMO

## 2019-06-14 DIAGNOSIS — J9601 Acute respiratory failure with hypoxia: Secondary | ICD-10-CM

## 2019-06-14 DIAGNOSIS — I5033 Acute on chronic diastolic (congestive) heart failure: Secondary | ICD-10-CM

## 2019-06-14 LAB — URINALYSIS, ROUTINE W REFLEX MICROSCOPIC
Bilirubin Urine: NEGATIVE
Glucose, UA: NEGATIVE mg/dL
Hgb urine dipstick: NEGATIVE
Ketones, ur: NEGATIVE mg/dL
Leukocytes,Ua: NEGATIVE
Nitrite: NEGATIVE
Protein, ur: NEGATIVE mg/dL
Specific Gravity, Urine: 1.017 (ref 1.005–1.030)
pH: 6 (ref 5.0–8.0)

## 2019-06-14 LAB — CBC WITH DIFFERENTIAL/PLATELET
Abs Immature Granulocytes: 0.86 10*3/uL — ABNORMAL HIGH (ref 0.00–0.07)
Basophils Absolute: 0.1 10*3/uL (ref 0.0–0.1)
Basophils Relative: 0 %
Eosinophils Absolute: 0 10*3/uL (ref 0.0–0.5)
Eosinophils Relative: 0 %
HCT: 22.5 % — ABNORMAL LOW (ref 36.0–46.0)
Hemoglobin: 6.9 g/dL — CL (ref 12.0–15.0)
Immature Granulocytes: 5 %
Lymphocytes Relative: 15 %
Lymphs Abs: 2.3 10*3/uL (ref 0.7–4.0)
MCH: 28 pg (ref 26.0–34.0)
MCHC: 30.7 g/dL (ref 30.0–36.0)
MCV: 91.5 fL (ref 80.0–100.0)
Monocytes Absolute: 1.1 10*3/uL — ABNORMAL HIGH (ref 0.1–1.0)
Monocytes Relative: 7 %
Neutro Abs: 11.6 10*3/uL — ABNORMAL HIGH (ref 1.7–7.7)
Neutrophils Relative %: 73 %
Platelets: 246 10*3/uL (ref 150–400)
RBC: 2.46 MIL/uL — ABNORMAL LOW (ref 3.87–5.11)
RDW: 19.9 % — ABNORMAL HIGH (ref 11.5–15.5)
WBC: 16 10*3/uL — ABNORMAL HIGH (ref 4.0–10.5)
nRBC: 0.4 % — ABNORMAL HIGH (ref 0.0–0.2)

## 2019-06-14 LAB — COMPREHENSIVE METABOLIC PANEL
ALT: 21 U/L (ref 0–44)
AST: 19 U/L (ref 15–41)
Albumin: 3.5 g/dL (ref 3.5–5.0)
Alkaline Phosphatase: 131 U/L — ABNORMAL HIGH (ref 38–126)
Anion gap: 13 (ref 5–15)
BUN: 20 mg/dL (ref 8–23)
CO2: 40 mmol/L — ABNORMAL HIGH (ref 22–32)
Calcium: 9 mg/dL (ref 8.9–10.3)
Chloride: 92 mmol/L — ABNORMAL LOW (ref 98–111)
Creatinine, Ser: 0.78 mg/dL (ref 0.44–1.00)
GFR calc Af Amer: >60 mL/min (ref >60)
GFR calc non Af Amer: >60 mL/min (ref >60)
Glucose, Bld: 122 mg/dL — ABNORMAL HIGH (ref 70–99)
Potassium: 2.8 mmol/L — ABNORMAL LOW (ref 3.5–5.1)
Sodium: 145 mmol/L (ref 135–145)
Total Bilirubin: 0.3 mg/dL (ref 0.3–1.2)
Total Protein: 5.7 g/dL — ABNORMAL LOW (ref 6.5–8.1)

## 2019-06-14 LAB — CORTISOL: Cortisol, Plasma: 30.9 ug/dL

## 2019-06-14 LAB — HEMOGLOBIN AND HEMATOCRIT, BLOOD
HCT: 24.1 % — ABNORMAL LOW (ref 36.0–46.0)
Hemoglobin: 7.8 g/dL — ABNORMAL LOW (ref 12.0–15.0)

## 2019-06-14 LAB — MAGNESIUM: Magnesium: 1.5 mg/dL — ABNORMAL LOW (ref 1.7–2.4)

## 2019-06-14 LAB — PROCALCITONIN: Procalcitonin: <0.1 ng/mL

## 2019-06-14 LAB — PREPARE RBC (CROSSMATCH)

## 2019-06-14 MED ORDER — POTASSIUM CHLORIDE CRYS ER 20 MEQ PO TBCR
40.0000 meq | EXTENDED_RELEASE_TABLET | Freq: Four times a day (QID) | ORAL | Status: AC
  Administered 2019-06-14 (×2): 40 meq via ORAL
  Filled 2019-06-14 (×2): qty 2

## 2019-06-14 MED ORDER — SODIUM CHLORIDE 0.9% IV SOLUTION: Freq: Once | INTRAVENOUS | Status: DC

## 2019-06-14 MED ORDER — MAGNESIUM OXIDE 400 (241.3 MG) MG PO TABS
400.0000 mg | ORAL_TABLET | Freq: Two times a day (BID) | ORAL | Status: DC
  Administered 2019-06-14 – 2019-06-19 (×6): 400 mg via ORAL
  Filled 2019-06-14 (×7): qty 1

## 2019-06-14 NOTE — Progress Notes (Signed)
   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   Noted to be needing more O2 with elevated BNP and Cr in ER.  Given lasix with about 1L out and improved renal indices.  CXR, troponins, echo benign.  Sometime overnight started having pressor requirement of unclear etiology.  Also has leukocytosis with left shift, she is about 14 days out from a filgastrim injection.  Empiric HCAP medications have been started and a CTA has been ordered to eval lung parenchyma and r/o PE.    At time of evaluation, patient's only complaints are a chronic cough and mild chronic wheezing.  She states all other symptoms are at baseline.  Of note, patient has been taking multiple rounds of prednisone with her chemotherapy.  Past Medical History  -HFPEF -CAD -SCLC -COPD on HOT -Osteoporosis -GERD -HLD -N/V related to chemo -Aflutter -Prior issue in 2017 with hypotension after EGD: self resolved but was noted to have Aflutter paroxysmal  Significant Hospital Events   06/12/11 admitted  Consults:  PCCM  Procedures:  NA  Significant Diagnostic Tests:  CXR R volume loss no edema or infiltrates NM scan low prob Echo WNL  Micro Data:  Blood cultures 7/12>> MRSA swab neg  Antimicrobials:  Cefepime 7/12>>> Vanc 7/12>>>   Interim history/subjective:  Off pressors.  Denies pain, diuresed well, wants to know when she can go home.  Objective   Blood pressure (!) 146/66, pulse 89, temperature 98.6  F (37 C), temperature source Oral, resp. rate 18, height 5\' 2"  (1.575 m), weight 62.9 kg, SpO2 97 %.        Intake/Output Summary (Last 24 hours) at 06/14/2019 0826 Last data filed at 06/14/2019 2025 Gross per 24 hour  Intake 1358.78 ml  Output 3875 ml  Net -2516.22 ml   Filed Weights   06/12/19 0829 06/12/19 1633 06/14/19 0500  Weight: 60.8 kg 65.9 kg 62.9 kg    Examination: GEN: elderly woman in NAD HEENT: wig noted, MMM, no thrush CV: RRR, ext warm PULM: Diminished R base, less wheezing today GI: Soft, +BS EXT: No edema NEURO: Moves all 4 ext to command PSYCH: Fair insight, flat affect SKIN: No rashes  Resolved Hospital Problem list    Assessment & Plan:  # Hypotension # Leukocytosis # Elevated BNP, volume overload improved # Atelectasis # Hx limited stage SCLC on chemo including intermitent chemo and filgrastim # Acute on chronic hypoxemia now near baseline # LE edema resolved # HFPEF  See discussion yesterday regarding hypotension.  It has self-resolved, not sure we need to chase workup much more.  Procal negative, no need for abx.  Patient should mobilize and use IS, this will likely improve her O2 needs more.  Would stop further lasix for now consider switching to torsemide per cardiology once electrolytes repleted.  Will sign off, please call if we can be of further help.  Erskine Emery MD PCCM

## 2019-06-14 NOTE — Progress Notes (Signed)
Patient is weak and has weak, congested cough which is nonproductive. However, she states she has been coughing up some white phlegm. Breathing pattern normal and in NAD. She does not tolerate the flutter valve due to not being able to blow enough air into the mouthpiece vs poor effort. Attempted CPT via bed and she complains of back pain and says "turn it off" despite the lowest intensity settings. RT will continue to follow and encourage use of the flutter valve.

## 2019-06-14 NOTE — Telephone Encounter (Signed)
Pt husband called to cancel pt appts this week. Pt admitted 7/10 to [facility] due to fatigue and multiple falls currently on CPAP in ICU Rm# [id number]. Message sent to scheduling to cancel this weeks appts.

## 2019-06-14 NOTE — TOC Initial Note (Signed)
Transition of Care [facility]) - Initial/Assessment Note    Patient Details  Name: Tonya Bradley MRN: [id number] Date of Birth: [birth year]  [facility]) CM/SW Contact:    Nila Nephew, LCSW Phone Number: [phone] 06/14/2019, 12:52 PM  Clinical Narrative:     Completed High Risk Readmission screening due to score 43% Please consult TOC team for care needs as hospitalization progresses                     Activities of Daily Living Home Assistive Devices/Equipment: None ADL Screening (condition at time of admission) Patient's cognitive ability adequate to safely complete daily activities?: Yes Is the patient deaf or have difficulty hearing?: No Does the patient have difficulty seeing, even when wearing glasses/contacts?: No Does the patient have difficulty concentrating, remembering, or making decisions?: No Patient able to express need for assistance with ADLs?: Yes Does the patient have difficulty dressing or bathing?: Yes Independently performs ADLs?: No Communication: Independent Dressing (OT): Needs assistance Is this a change from baseline?: Pre-admission baseline Grooming: Independent Feeding: Independent Bathing: Needs assistance Is this a change from baseline?: Pre-admission baseline Toileting: Needs assistance Is this a change from baseline?: Pre-admission baseline In/Out Bed: Needs assistance Is this a change from baseline?: Pre-admission baseline Walks in Home: Independent Does the patient have difficulty walking or climbing stairs?: No Weakness of Legs: None Weakness of Arms/Hands: None  Permission Sought/Granted                  Emotional Assessment              Admission diagnosis:  Weakness [R53.1] Acute on chronic combined systolic and diastolic congestive heart failure (HCC) [I50.43] AKI (acute kidney injury) ([place]) [N17.9] Patient Active Problem List   Diagnosis Date Noted  . Acute respiratory failure with hypoxemia ([place])    . Shock ([place])   . AKI (acute kidney injury) ([place])   . Acute on chronic diastolic (congestive) heart failure ([place]) 06/12/2019  . Chemotherapy induced nausea and vomiting 05/26/2019  . Goals of care, counseling/discussion 03/25/2019  . Encounter for antineoplastic chemotherapy 03/25/2019  . Small cell lung cancer, right ([employer]) 02/18/2019  . S/P Right Upper Lobectomy, Right Middle Lobectomy 02/04/2019  . Colitis 01/01/2019  . Dehydration 01/01/2019  . Hypokalemia 01/01/2019  . Dysphonia 06/19/2018  . Bilateral lower extremity edema 02/16/2018  . Lumbar stenosis 01/19/2018  . GERD (gastroesophageal reflux disease) 11/18/2017  . 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 COPD (chronic obstructive pulmonary disease) ([place]) 09/09/2017  . Varicose veins of bilateral lower extremities with other complications [id number]  . Chronic pain disorder 05/26/2017  . Back pain 05/26/2017  . Insomnia 05/26/2017  . Osteopenia 11/26/2016  . Other emphysema ([place]) 11/07/2016  . Chronic anxiety 11/07/2016  . Chronic hypoxemic respiratory failure ([place]) 11/07/2016  . Constipation 06/12/2016  . Right carotid bruit 04/03/2016  . CAD (coronary artery disease) 04/03/2016  . Tobacco abuse 04/03/2016  . Atrial flutter ([place]) 03/13/2016  . Hiatal hernia   . Reflux esophagitis   . Schatzki's ring   . Dysphagia 03/01/2016  . Solitary pulmonary nodule 03/01/2016  . 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 Microscopic colitis 08/15/2014  . Loose stools 06/08/2014  . BCC (basal cell carcinoma), face 09/13/2012   PCP:  Martinique, [name], MD Pharmacy:   [facility] [address] [place]  [place] [address] Phone: [phone] Fax: [phone]  [facility] - [address], [place] [address] Phone: [phone] Fax:  [phone]     Social Determinants of Health (SDOH) Interventions    Readmission Risk Interventions Readmission Risk Prevention Plan 06/14/2019 02/09/2019  Transportation Screening Complete Complete  HRI or Home Care Consult - Complete  Palliative Care Screening - Not Applicable  Medication Review (RN Care Manager) Not Complete -  Med Review Comments some medications reported PTA need review per RN admission summary -  PCP or Specialist appointment within 3-5 days of discharge Not Complete -  PCP/Specialist Appt Not Complete comments Pt established with PCP and cardiologist however DC date uknown -  HRI or Home Care Consult Complete -  SW Recovery Care/Counseling Consult Complete -  Palliative Care Screening Not Applicable -  [place] Not Applicable -  Some recent data might be hidden

## 2019-06-14 NOTE — Progress Notes (Signed)
CRITICAL VALUE ALERT  Critical Value:  Potassium 2.8  Date & Time Notied:  06/14/2019 0435  Provider Notified: TRH NP  Orders Received/Actions taken: Awaiting orders

## 2019-06-14 NOTE — Progress Notes (Signed)
Marland Kitchen  PROGRESS NOTE    Tonya Bradley  UVO:[id number] DOB: May 04, 1951 DOA: 06/12/2019 PCP: [place], [name], MD   Brief Narrative:   Tonya Bradley a 68 y.o.femalewith medical history significant fordiastolic heart failure, atrial flutter, CAD, COPD oxygen dependent, small cell lung cancer, tobacco abuse, presented to the ED complaining of lethargy which is been ongoing past couple of days,bilateral lower extremity edema which has been ongoing for weeks. Of note, patient is currently undergoing chemotherapy with Dr. Earlie Server.Patient has chronic dyspnea, noted to be requiring more oxygen. Patient denies any chest pain, cough, nausea/vomiting, abdominal pain, diarrhea,fever/chills.   Assessment & Plan:   Principal Problem:   Acute on chronic diastolic (congestive) heart failure (HCC) Active Problems:   Atrial flutter (HCC)   CAD (coronary artery disease)   Tobacco abuse   Chronic hypoxemic respiratory failure (HCC)   COPD (chronic obstructive pulmonary disease) (HCC)   Small cell lung cancer, right ([place])   Shock ([place])   AKI (acute kidney injury) ([place])   Acute respiratory failure with hypoxemia (HCC)   Acute on chronic hypoxic respiratory failure     - Patient noted to desaturate to as low 70s 80s on 2 L (uses 2L chronically)     - at admission she clinically looked overloaded     - BNP noted to be 2597, troponin negative, EKG with no acute ST changes     - Chest x-ray showed no consolidation or edema     - Echo done on 03/2016 showed EF of 55 to 60%, repeat echo: normal systolic function, with an ejection fraction of 60-65%; right ventricle has normal systolic function; Right ventricular systolic pressure could not be assessed; There is no evidence of pericardial effusion.     - CTA chest ordered, dopplers negative     - cardiology onboard; s/p lasix IV     - strict I&O's, daily weights     - required HFNC ON, but is on 4L [place] at this time     - CTA PE: no PE; post surgical  changes noted; atelectasis  AKI     - Renal vascular congestion/cardiorenal/??unlikely intravascular volume depletion     - Daily BMP     - SCr is 0.78; resolved  Hypotension     - Monitor closely in SDU     - required levephed last night and remains on it this afternoon     - have asked PCCM for back up     - now off pressors and maintaining     - cortisol levels are ok  Chronic leukocytosis     - Ongoing, around baseline     - Daily CBC  Anemia of chronic disease from malignancy     - Likely from chemotherapy, denies any GI bleeding     - FOBT pending     - Hgb 6.9 this AM, 1 units pRBCs ordered  Thrombocytopenia     - Chronic, ongoing, monitor  Atrial flutter     - Rate controlled     - Not on any anticoagulation     - Held PTA amiodarone, due to low BP     - cardiology onboard  COPD     - Continue supplemental oxygen, inhalers  Small cell lung cancer     - Currently on chemotherapy     - Followed by Dr. Earlie Server  GERD     - PPI  Tobacco abuse     - Advised to quit     -  Nicotine patch  Depression/anxiety     - Continue quetiapine, Cymbalta  Sepsis     - unknown cause     - Bld Cx ordered     - CTA PE ordered     - vanc/cefepime; currently on levephed     - she's afebrile at this time     - abx stopped; pressors off; BP ok  Acute on chronic diastolic HF     - BMP 1017     - received lasix IV; hold for now     - cardiology onboard, appreciate assistance     - lasix held per cards for today; will try demedex tomorrow?  DVT prophylaxis: heparin Code Status: DNR Disposition Plan: TBD  Consultants:   Cardiology  PCCM   Subjective: "I feel achy"  Objective: Vitals:   06/14/19 1100 06/14/19 1200 06/14/19 1300 06/14/19 1400  BP: 121/64 135/67 134/70 (!) 125/52  Pulse: 91 93 89 99  Resp: 10 12 10 20   Temp:  97.8 F (36.6 C)    TempSrc:  Oral    SpO2: (!) 86% 95% 97% 97%  Weight:      Height:        Intake/Output  Summary (Last 24 hours) at 06/14/2019 1410 Last data filed at 06/14/2019 1040 Gross per 24 hour  Intake 1350.87 ml  Output 3750 ml  Net -2399.13 ml   Filed Weights   06/12/19 0829 06/12/19 1633 06/14/19 0500  Weight: 60.8 kg 65.9 kg 62.9 kg    Examination:  General: 68 y.o. female resting in bed in NAD Cardiovascular: RRR, +S1, S2, no m/g/r, equal pulses throughout Respiratory: clear anteriorly but decreased air movement, no accessory muscle use GI: BS+, NDNT, no masses noted, no organomegaly noted MSK: No c/c; slight pedal edema b/l Skin: No rashes, bruises, ulcerations noted Neuro: A&O x 3, no focal deficits Psyc: Appropriate interaction and affect, calm/cooperative    Data Reviewed: I have personally reviewed following labs and imaging studies.  CBC: Recent Labs  Lab 06/09/19 1154 06/12/19 0845 06/13/19 0243 06/14/19 0250 06/14/19 1238  WBC 15.7* 16.9* 22.7* 16.0*  --   NEUTROABS 9.3* 11.5*  --  11.6*  --   HGB 8.2* 7.8* 8.8* 6.9* 7.8*  HCT 25.8* 26.0* 28.7* 22.5* 24.1*  MCV 88.7 94.2 93.5 91.5  --   PLT 63* 128* 227 246  --    Basic Metabolic Panel: Recent Labs  Lab 06/09/19 1154 06/12/19 0845 06/13/19 0243 06/14/19 0250  NA 140 140 143 145  K 2.7* 4.7 3.9 2.8*  CL 91* 98 95* 92*  CO2 38* 30 33* 40*  GLUCOSE 143* 98 169* 122*  BUN 18 39* 32* 20  CREATININE 0.93 2.10* 1.18* 0.78  CALCIUM 9.0 8.5* 8.9 9.0  MG 1.5* 2.2  --  1.5*   GFR: Estimated Creatinine Clearance: 57.8 mL/min (by C-G formula based on SCr of 0.78 mg/dL). Liver Function Tests: Recent Labs  Lab 06/09/19 1154 06/12/19 0845 06/14/19 0250  AST 22 34 19  ALT 22 26 21   ALKPHOS 187* 163* 131*  BILITOT 0.2* 0.3 0.3  PROT 6.1* 5.6* 5.7*  ALBUMIN 3.1* 2.9* 3.5   No results for input(s): LIPASE, AMYLASE in the last 168 hours. Recent Labs  Lab 06/12/19 0851  AMMONIA 25   Coagulation Profile: No results for input(s): INR, PROTIME in the last 168 hours. Cardiac Enzymes: No results  for input(s): CKTOTAL, CKMB, CKMBINDEX, TROPONINI in the last 168 hours. BNP (last 3 results) No  results for input(s): PROBNP in the last 8760 hours. HbA1C: No results for input(s): HGBA1C in the last 72 hours. CBG: No results for input(s): GLUCAP in the last 168 hours. Lipid Profile: No results for input(s): CHOL, HDL, LDLCALC, TRIG, CHOLHDL, LDLDIRECT in the last 72 hours. Thyroid Function Tests: No results for input(s): TSH, T4TOTAL, FREET4, T3FREE, THYROIDAB in the last 72 hours. Anemia Panel: No results for input(s): VITAMINB12, FOLATE, FERRITIN, TIBC, IRON, RETICCTPCT in the last 72 hours. Sepsis Labs: Recent Labs  Lab 06/13/19 1718 06/14/19 0250  PROCALCITON <0.10 <0.10    Recent Results (from the past 240 hour(s))  SARS Coronavirus 2 (CEPHEID - Performed in [facility] hospital lab), Hosp Order     Status: None   Collection Time: 06/12/19 10:59 AM   Specimen: Nasopharyngeal Swab  Result Value Ref Range Status   SARS Coronavirus 2 NEGATIVE NEGATIVE Final    Comment: (NOTE) If result is NEGATIVE SARS-CoV-2 target nucleic acids are NOT DETECTED. The SARS-CoV-2 RNA is generally detectable in upper and lower  respiratory specimens during the acute phase of infection. The lowest  concentration of SARS-CoV-2 viral copies this assay can detect is 250  copies / mL. A negative result does not preclude SARS-CoV-2 infection  and should not be used as the sole basis for treatment or other  patient management decisions.  A negative result may occur with  improper specimen collection / handling, submission of specimen other  than nasopharyngeal swab, presence of viral mutation(s) within the  areas targeted by this assay, and inadequate number of viral copies  (<250 copies / mL). A negative result must be combined with clinical  observations, patient history, and epidemiological information. If result is POSITIVE SARS-CoV-2 target nucleic acids are DETECTED. The SARS-CoV-2 RNA is  generally detectable in upper and lower  respiratory specimens dur ing the acute phase of infection.  Positive  results are indicative of active infection with SARS-CoV-2.  Clinical  correlation with patient history and other diagnostic information is  necessary to determine patient infection status.  Positive results do  not rule out bacterial infection or co-infection with other viruses. If result is PRESUMPTIVE POSTIVE SARS-CoV-2 nucleic acids MAY BE PRESENT.   A presumptive positive result was obtained on the submitted specimen  and confirmed on repeat testing.  While 2019 novel coronavirus  (SARS-CoV-2) nucleic acids may be present in the submitted sample  additional confirmatory testing may be necessary for epidemiological  and / or clinical management purposes  to differentiate between  SARS-CoV-2 and other Sarbecovirus currently known to infect humans.  If clinically indicated additional testing with an alternate test  methodology [phone]) is advised. The SARS-CoV-2 RNA is generally  detectable in upper and lower respiratory sp ecimens during the acute  phase of infection. The expected result is Negative. Fact Sheet for Patients:  StrictlyIdeas.no Fact Sheet for Healthcare Providers: BankingDealers.co.za This test is not yet approved or cleared by the [place] FDA and has been authorized for detection and/or diagnosis of SARS-CoV-2 by FDA under an Emergency Use Authorization (EUA).  This EUA will remain in effect (meaning this test can be used) for the duration of the COVID-19 declaration under Section 564(b)(1) of the Act, 21 U.S.C. section 360bbb-3(b)(1), unless the authorization is terminated or revoked sooner. Performed at [facility], [place] [address] [place], [place] [address]   MRSA PCR Screening     Status: None   Collection Time: 06/12/19  7:30 PM   Specimen: Nasal Mucosa;  Nasopharyngeal    Result Value Ref Range Status   MRSA by PCR NEGATIVE NEGATIVE Final    Comment:        The GeneXpert MRSA Assay (FDA approved for NASAL specimens only), is one component of a comprehensive MRSA colonization surveillance program. It is not intended to diagnose MRSA infection nor to guide or monitor treatment for MRSA infections. Performed at [facility], [place] [address], [place], [place] [address]   Culture, blood (routine x 2)     Status: None (Preliminary result)   Collection Time: 06/13/19 11:48 AM   Specimen: BLOOD RIGHT WRIST  Result Value Ref Range Status   Specimen Description   Final    BLOOD RIGHT WRIST Performed at [place] [address], [place], [place] [address]    Special Requests   Final    BOTTLES DRAWN AEROBIC ONLY Blood Culture results may not be optimal due to an inadequate volume of blood received in culture bottles Performed at [place] [address], [place], [place] [address]    Culture PENDING  Incomplete   Report Status PENDING  Incomplete         Radiology Studies: Ct Angio Chest Pe W Or Wo Contrast  Result Date: 06/13/2019 CLINICAL DATA:  Lethargy. Bilateral lower extremity swelling. History of small cell lung cancer. EXAM: CT ANGIOGRAPHY CHEST WITH CONTRAST TECHNIQUE: Multidetector CT imaging of the chest was performed using the standard protocol during bolus administration of intravenous contrast. Multiplanar CT image reconstructions and MIPs were obtained to evaluate the vascular anatomy. CONTRAST:  96mL OMNIPAQUE IOHEXOL 350 MG/ML SOLN COMPARISON:  CT scans of the chest from January 11, 2019 and February 15, 2019. Chest x-ray June 12, 2019. PET-CT January 15, 2019. FINDINGS: Cardiovascular: Atherosclerotic changes are seen in the nonaneurysmal aorta. No dissection. Coronary artery calcifications are noted. Cardiomegaly is stable. No pulmonary emboli identified. Mediastinum/Nodes: Small  bilateral pleural effusions. No pericardial effusion. The esophagus and thyroid are normal. No adenopathy. Lungs/Pleura: There is debris layering in the trachea. The left mainstem bronchus and left-sided airways are unremarkable. The patient is status post right upper lobectomy. There is narrowing over a short distance at the origin of the right middle lobe bronchus there is mild narrowing in the proximal bronchus intermedius as seen on axial image 53. The remainder of the right-sided airways are unremarkable. No pneumothorax. Mild nodularity in the left apex is is stable, consistent with scarring based on the lack of uptake in this region on previous PET-CT imaging. Scattered ground-glass in the left lung is similar in the interval. These regions of ground-glass could represent focal atelectasis, infection, or inflammation. No new nodules or masses. Small bilateral pleural effusions with underlying atelectasis are identified. Upper Abdomen: No acute abnormality. Musculoskeletal: No chest wall abnormality. No acute or significant osseous findings. Review of the MIP images confirms the above findings. IMPRESSION: 1. No pulmonary emboli identified. 2. There is short segment narrowing of the right middle lobe airway near its origin. There is mild narrowing of the bronchus intermedius. The narrowings could be postsurgical/therapeutic. No soft tissue mass seen in this region. 3. Small bilateral pleural effusions with underlying atelectasis. 4. Scattered ground-glass opacities in the left lung may represent regions of focal atelectasis. The finding is unchanged. Infectious or inflammatory causes are possible as well. 5. Atherosclerotic changes in the aorta. Coronary artery calcifications. Aortic Atherosclerosis (ICD10-I70.0). Electronically Signed   By: Dorise Bullion III M.D   On: 06/13/2019 17:09  Nm Pulmonary Perfusion  Result Date: 06/12/2019 CLINICAL DATA:  Shortness of breath EXAM: NUCLEAR MEDICINE PERFUSION  LUNG SCAN TECHNIQUE: Perfusion images were obtained in multiple projections after intravenous injection of radiopharmaceutical. Ventilation scans intentionally deferred if perfusion scan and chest x-ray adequate for interpretation during COVID 19 epidemic. RADIOPHARMACEUTICALS:  4.17 mCi Tc-5m MAA IV COMPARISON:  Chest x-ray from earlier in the same day. FINDINGS: Adequate perfusion is noted throughout both lungs without focal defect to suggest pulmonary embolism. Elevation of the right hemidiaphragm is noted. IMPRESSION: No focal perfusion defect to suggest pulmonary embolism. Electronically Signed   By: Inez Catalina M.D.   On: 06/12/2019 23:04   Vas [place] Lower Extremity Venous (dvt)  Result Date: 06/13/2019  Lower Venous Study Indications: Edema.  Risk Factors: VQ scan negative for PE. Comparison Study: no prior Performing Technologist: June Leap RDMS, RVT  Examination Guidelines: A complete evaluation includes B-mode imaging, spectral Doppler, color Doppler, and power Doppler as needed of all accessible portions of each vessel. Bilateral testing is considered an integral part of a complete examination. Limited examinations for reoccurring indications may be performed as noted.  +---------+---------------+---------+-----------+----------+-------+  RIGHT     Compressibility Phasicity Spontaneity Properties Summary  +---------+---------------+---------+-----------+----------+-------+  CFV       Full            Yes       Yes                             +---------+---------------+---------+-----------+----------+-------+  SFJ       Full                                                      +---------+---------------+---------+-----------+----------+-------+  FV Prox   Full                                                      +---------+---------------+---------+-----------+----------+-------+  FV Mid    Full                                                       +---------+---------------+---------+-----------+----------+-------+  FV Distal Full                                                      +---------+---------------+---------+-----------+----------+-------+  PFV       Full                                                      +---------+---------------+---------+-----------+----------+-------+  POP       Full            Yes       Yes                             +---------+---------------+---------+-----------+----------+-------+  PTV       Full                                                      +---------+---------------+---------+-----------+----------+-------+  PERO      Full                                                      +---------+---------------+---------+-----------+----------+-------+   +---------+---------------+---------+-----------+----------+-------+  LEFT      Compressibility Phasicity Spontaneity Properties Summary  +---------+---------------+---------+-----------+----------+-------+  CFV       Full            Yes       Yes                             +---------+---------------+---------+-----------+----------+-------+  SFJ       Full                                                      +---------+---------------+---------+-----------+----------+-------+  FV Prox   Full                                                      +---------+---------------+---------+-----------+----------+-------+  FV Mid    Full                                                      +---------+---------------+---------+-----------+----------+-------+  FV Distal Full                                                      +---------+---------------+---------+-----------+----------+-------+  PFV       Full                                                      +---------+---------------+---------+-----------+----------+-------+  POP       Full            Yes       Yes                             +---------+---------------+---------+-----------+----------+-------+  PTV       Full                                                       +---------+---------------+---------+-----------+----------+-------+  PERO      Full                                                      +---------+---------------+---------+-----------+----------+-------+     Summary: Right: There is no evidence of deep vein thrombosis in the lower extremity. No cystic structure found in the popliteal fossa. Left: There is no evidence of deep vein thrombosis in the lower extremity. No cystic structure found in the popliteal fossa.  *See table(s) above for measurements and observations.    Preliminary         Scheduled Meds:  sodium chloride   Intravenous Once   atorvastatin  80 mg Oral q1800   Chlorhexidine Gluconate Cloth  6 each Topical Daily   cycloSPORINE  1 drop Both Eyes BID   DULoxetine  60 mg Oral QHS   fluticasone furoate-vilanterol  1 puff Inhalation Daily   And   umeclidinium bromide  1 puff Inhalation Daily   heparin  5,000 Units Subcutaneous Q8H   ipratropium-albuterol  3 mL Nebulization TID   loratadine  10 mg Oral Daily   magnesium oxide  400 mg Oral BID   mouth rinse  15 mL Mouth Rinse BID   metoCLOPramide  5 mg Oral TID AC   nicotine  21 mg Transdermal Q24H   pantoprazole  40 mg Oral Daily   QUEtiapine  100 mg Oral QHS   rOPINIRole  0.5 mg Oral QHS   Continuous Infusions:   LOS: 2 days    Time spent: 35 minutes spent in the coordination of care today.     Jonnie Finner, DO [facility] Pager [phone]  If 7PM-7AM, please contact night-coverage [email or web address] Password [email or web address] 06/14/2019, 2:10 PM

## 2019-06-14 NOTE — Progress Notes (Signed)
Progress Note  Patient Name: Tonya Bradley Date of Encounter: 06/14/2019  Primary Cardiologist: Carlyle Dolly, MD  Primary Electrophysiologist: Dr. Lovena Le  Subjective   She states she feels ok but she looks very tired and weak. Currently getting blood transfusion. Denies CP. On 4L O2 [place]. Her baseline at home is 3 L/min.   Inpatient Medications    Scheduled Meds:  sodium chloride   Intravenous Once   atorvastatin  80 mg Oral q1800   Chlorhexidine Gluconate Cloth  6 each Topical Daily   cycloSPORINE  1 drop Both Eyes BID   DULoxetine  60 mg Oral QHS   fluticasone furoate-vilanterol  1 puff Inhalation Daily   And   umeclidinium bromide  1 puff Inhalation Daily   heparin  5,000 Units Subcutaneous Q8H   ipratropium-albuterol  3 mL Nebulization TID   loratadine  10 mg Oral Daily   magnesium oxide  400 mg Oral BID   mouth rinse  15 mL Mouth Rinse BID   metoCLOPramide  5 mg Oral TID AC   nicotine  21 mg Transdermal Q24H   pantoprazole  40 mg Oral Daily   potassium chloride  40 mEq Oral Q6H   QUEtiapine  100 mg Oral QHS   rOPINIRole  0.5 mg Oral QHS   Continuous Infusions:  albumin human 25 g (06/14/19 0515)   norepinephrine (LEVOPHED) Adult infusion 2 mcg/min (06/14/19 0410)   PRN Meds: acetaminophen **OR** acetaminophen, albuterol, ALPRAZolam, ondansetron **OR** ondansetron (ZOFRAN) IV, prochlorperazine, senna-docusate   Vital Signs    Vitals:   06/14/19 0800 06/14/19 0827 06/14/19 0830 06/14/19 0832  BP: (!) 146/66 115/73 (!) 121/59   Pulse: 89  92 92  Resp: 18 15 13    Temp: 97.6 F (36.4 C) (!) 97.5 F (36.4 C)    TempSrc: Oral Axillary    SpO2: 97% 96% 95%   Weight:      Height:        Intake/Output Summary (Last 24 hours) at 06/14/2019 0846 Last data filed at 06/14/2019 6387 Gross per 24 hour  Intake 1358.78 ml  Output 3875 ml  Net -2516.22 ml   Last 3 Weights 06/14/2019 06/12/2019 06/12/2019  Weight (lbs) 138 lb 10.7 oz 145 lb 4.5  oz 134 lb  Weight (kg) 62.9 kg 65.9 kg 60.782 kg      Telemetry    NSR 87 bpm  - Personally Reviewed  ECG    Not performed today. - Personally Reviewed  Physical Exam   GEN: elderly WM, looks tired and weak but no acute distress.   Neck: No JVD Cardiac: RRR, no murmurs, rubs, or gallops.  Respiratory: rhonchi bilaterally, no rales GI: Soft, nontender, non-distended  MS: No edema; No deformity. Neuro:  Nonfocal  Psych: Normal affect   Labs    High Sensitivity Troponin:   Recent Labs  Lab 06/12/19 1649 06/12/19 1912  TROPONINIHS 5.0 6.0      Cardiac EnzymesNo results for input(s): TROPONINI in the last 168 hours. No results for input(s): TROPIPOC in the last 168 hours.   Chemistry Recent Labs  Lab 06/09/19 1154 06/12/19 0845 06/13/19 0243 06/14/19 0250  NA 140 140 143 145  K 2.7* 4.7 3.9 2.8*  CL 91* 98 95* 92*  CO2 38* 30 33* 40*  GLUCOSE 143* 98 169* 122*  BUN 18 39* 32* 20  CREATININE 0.93 2.10* 1.18* 0.78  CALCIUM 9.0 8.5* 8.9 9.0  PROT 6.1* 5.6*  --  5.7*  ALBUMIN 3.1* 2.9*  --  3.5  AST 22 34  --  19  ALT 22 26  --  21  ALKPHOS 187* 163*  --  131*  BILITOT 0.2* 0.3  --  0.3  GFRNONAA >60 23* 47* >60  GFRAA >60 27* 54* >60  ANIONGAP 11 12 15 13      Hematology Recent Labs  Lab 06/12/19 0845 06/13/19 0243 06/14/19 0250  WBC 16.9* 22.7* 16.0*  RBC 2.76* 3.07* 2.46*  HGB 7.8* 8.8* 6.9*  HCT 26.0* 28.7* 22.5*  MCV 94.2 93.5 91.5  MCH 28.3 28.7 28.0  MCHC 30.0 30.7 30.7  RDW 19.9* 20.1* 19.9*  PLT 128* 227 246    BNP Recent Labs  Lab 06/12/19 0845  BNP 2,597.8*     DDimer No results for input(s): DDIMER in the last 168 hours.   Radiology    Ct Angio Chest Pe W Or Wo Contrast  Result Date: 06/13/2019 CLINICAL DATA:  Lethargy. Bilateral lower extremity swelling. History of small cell lung cancer. EXAM: CT ANGIOGRAPHY CHEST WITH CONTRAST TECHNIQUE: Multidetector CT imaging of the chest was performed using the standard protocol  during bolus administration of intravenous contrast. Multiplanar CT image reconstructions and MIPs were obtained to evaluate the vascular anatomy. CONTRAST:  6mL OMNIPAQUE IOHEXOL 350 MG/ML SOLN COMPARISON:  CT scans of the chest from January 11, 2019 and February 15, 2019. Chest x-ray June 12, 2019. PET-CT January 15, 2019. FINDINGS: Cardiovascular: Atherosclerotic changes are seen in the nonaneurysmal aorta. No dissection. Coronary artery calcifications are noted. Cardiomegaly is stable. No pulmonary emboli identified. Mediastinum/Nodes: Small bilateral pleural effusions. No pericardial effusion. The esophagus and thyroid are normal. No adenopathy. Lungs/Pleura: There is debris layering in the trachea. The left mainstem bronchus and left-sided airways are unremarkable. The patient is status post right upper lobectomy. There is narrowing over a short distance at the origin of the right middle lobe bronchus there is mild narrowing in the proximal bronchus intermedius as seen on axial image 53. The remainder of the right-sided airways are unremarkable. No pneumothorax. Mild nodularity in the left apex is is stable, consistent with scarring based on the lack of uptake in this region on previous PET-CT imaging. Scattered ground-glass in the left lung is similar in the interval. These regions of ground-glass could represent focal atelectasis, infection, or inflammation. No new nodules or masses. Small bilateral pleural effusions with underlying atelectasis are identified. Upper Abdomen: No acute abnormality. Musculoskeletal: No chest wall abnormality. No acute or significant osseous findings. Review of the MIP images confirms the above findings. IMPRESSION: 1. No pulmonary emboli identified. 2. There is short segment narrowing of the right middle lobe airway near its origin. There is mild narrowing of the bronchus intermedius. The narrowings could be postsurgical/therapeutic. No soft tissue mass seen in this region. 3.  Small bilateral pleural effusions with underlying atelectasis. 4. Scattered ground-glass opacities in the left lung may represent regions of focal atelectasis. The finding is unchanged. Infectious or inflammatory causes are possible as well. 5. Atherosclerotic changes in the aorta. Coronary artery calcifications. Aortic Atherosclerosis (ICD10-I70.0). Electronically Signed   By: Dorise Bullion III M.D   On: 06/13/2019 17:09   Nm Pulmonary Perfusion  Result Date: 06/12/2019 CLINICAL DATA:  Shortness of breath EXAM: NUCLEAR MEDICINE PERFUSION LUNG SCAN TECHNIQUE: Perfusion images were obtained in multiple projections after intravenous injection of radiopharmaceutical. Ventilation scans intentionally deferred if perfusion scan and chest x-ray adequate for interpretation during COVID 19 epidemic. RADIOPHARMACEUTICALS:  4.17 mCi Tc-61m MAA IV COMPARISON:  Chest x-ray  from earlier in the same day. FINDINGS: Adequate perfusion is noted throughout both lungs without focal defect to suggest pulmonary embolism. Elevation of the right hemidiaphragm is noted. IMPRESSION: No focal perfusion defect to suggest pulmonary embolism. Electronically Signed   By: Inez Catalina M.D.   On: 06/12/2019 23:04   Dg Chest Port 1 View  Result Date: 06/12/2019 CLINICAL DATA:  Pain following fall EXAM: PORTABLE CHEST 1 VIEW COMPARISON:  Apr 18, 2019 FINDINGS: There is postoperative change on the right with volume loss and elevation of the right hemidiaphragm. There is no edema or consolidation. Heart is mildly enlarged with pulmonary vascularity within normal limits. No adenopathy. There is aortic atherosclerosis. No bone lesions. IMPRESSION: Postoperative change on the right with volume loss the. No edema or consolidation. Stable cardiac silhouette. Aortic Atherosclerosis (ICD10-I70.0). Electronically Signed   By: Lowella Grip III M.D.   On: 06/12/2019 09:15   Vas [place] Lower Extremity Venous (dvt)  Result Date: 06/13/2019  Lower  Venous Study Indications: Edema.  Risk Factors: VQ scan negative for PE. Comparison Study: no prior Performing Technologist: June Leap RDMS, RVT  Examination Guidelines: A complete evaluation includes B-mode imaging, spectral Doppler, color Doppler, and power Doppler as needed of all accessible portions of each vessel. Bilateral testing is considered an integral part of a complete examination. Limited examinations for reoccurring indications may be performed as noted.  +---------+---------------+---------+-----------+----------+-------+  RIGHT     Compressibility Phasicity Spontaneity Properties Summary  +---------+---------------+---------+-----------+----------+-------+  CFV       Full            Yes       Yes                             +---------+---------------+---------+-----------+----------+-------+  SFJ       Full                                                      +---------+---------------+---------+-----------+----------+-------+  FV Prox   Full                                                      +---------+---------------+---------+-----------+----------+-------+  FV Mid    Full                                                      +---------+---------------+---------+-----------+----------+-------+  FV Distal Full                                                      +---------+---------------+---------+-----------+----------+-------+  PFV       Full                                                      +---------+---------------+---------+-----------+----------+-------+  POP       Full            Yes       Yes                             +---------+---------------+---------+-----------+----------+-------+  PTV       Full                                                      +---------+---------------+---------+-----------+----------+-------+  PERO      Full                                                      +---------+---------------+---------+-----------+----------+-------+    +---------+---------------+---------+-----------+----------+-------+  LEFT      Compressibility Phasicity Spontaneity Properties Summary  +---------+---------------+---------+-----------+----------+-------+  CFV       Full            Yes       Yes                             +---------+---------------+---------+-----------+----------+-------+  SFJ       Full                                                      +---------+---------------+---------+-----------+----------+-------+  FV Prox   Full                                                      +---------+---------------+---------+-----------+----------+-------+  FV Mid    Full                                                      +---------+---------------+---------+-----------+----------+-------+  FV Distal Full                                                      +---------+---------------+---------+-----------+----------+-------+  PFV       Full                                                      +---------+---------------+---------+-----------+----------+-------+  POP       Full            Yes       Yes                             +---------+---------------+---------+-----------+----------+-------+  PTV       Full                                                      +---------+---------------+---------+-----------+----------+-------+  PERO      Full                                                      +---------+---------------+---------+-----------+----------+-------+     Summary: Right: There is no evidence of deep vein thrombosis in the lower extremity. No cystic structure found in the popliteal fossa. Left: There is no evidence of deep vein thrombosis in the lower extremity. No cystic structure found in the popliteal fossa.  *See table(s) above for measurements and observations.    Preliminary     Cardiac Studies   2D Echo 06/13/19 1. The left ventricle has normal systolic function with an ejection fraction of 60-65%. The cavity size was normal.  Indeterminate diastolic filling due to E-A fusion.  2. The right ventricle has normal systolic function. The cavity was normal. There is no increase in right ventricular wall thickness. Right ventricular systolic pressure could not be assessed.  3. Left atrial size was mildly dilated.  4. The aortic valve is abnormal. Moderate calcification of the aortic valve. No stenosis of the aortic valve.  5. The inferior vena cava was dilated in size with <50% respiratory variability.   Patient Profile     Tonya Bradley is a 68 y.o. female with a hx of chronic diastolic congestive heart failure, atrial flutter, coronary artery disease, COPD, small cell lung cancer, tobacco abuse who is being seen today for the evaluation of acute on chronic diastolic congestive heart failure at the request of  Dr. Horris Latino . 7/13>>worsening SOB and hypotension requiring transfer to ICU and pressor support.   Assessment & Plan    1. Acute on Chronic Diastolic CHF: pt required dose of IV Lasix last night and again at 5AM this morning for worsening dyspnea. States she feels better but still with increased O2 requirements (4 L/min currently- home baseline is 3L). She does not appear grossly volume overloaded on exam currently. Suspect her increase dyspnea and hypoxia also likely secondary to worsening anemia. Once hypotension resolves and no longer requiring pressors, the recommendation is to restart PO diuretics however it was recommended yesterday to change home diuretic from lasix to torsemide given failed response to increasing doses of lasix in the past.    2. Hypotension: became hypotensive overnight and developed worsening dyspnea. Transferred to ICU and started on pressors for hypotension. Remains on Levophed. Her  blocker and Cardizem are on hold. Hypotension may be 2/2 anemia as she has had a sudden drop in Hgb from 8.8>>6.9 in 24 hrs. Further management per primary team and CCM.   3. H/o Atrial Flutter: followed by  Dr. Lovena Le. S/p EP study and catheter ablation in 2018 and anticoagulation subsequently discontinued shortly after. She has been continued on low dose amiodarone. Maintaining NSR w/ HR in the 80s currently. Amiodarone, Metoprolol and Cardizem on hold due to hypotension.   4. Anemia: per primary team, she is suspected to have anemia of chronic disease from malignancy. No  overt bleeding. FOBT pending. Hgb 6.8 today. Transfusion per primary.   5. Hypokalemia: K 2.8. Supplemental K and Mg ordered by primary team. Follow and monitor telemetry closely. I have reviewed tele and no arrhthymias noted.   6. AKI: resolved. SCr up to 2.10 on 7/11. This has returned to normal at 0.78 today.   MD to follow with further recommendations.    For questions or updates, please contact [place] Please consult [email or web address] for contact info under        Signed, Lyda Jester, PA-C  06/14/2019, 8:46 AM

## 2019-06-14 NOTE — Telephone Encounter (Signed)
Added injection 7/20 per 7/13 schedule message. Chemo 7/15 thru 7/17. Not able to reach patient or leave message. Patient will get appointment at 7/15 visit.

## 2019-06-15 ENCOUNTER — Ambulatory Visit: Payer: Medicare HMO

## 2019-06-15 LAB — RENAL FUNCTION PANEL
Albumin: 3.9 g/dL (ref 3.5–5.0)
Anion gap: 14 (ref 5–15)
BUN: 17 mg/dL (ref 8–23)
CO2: 37 mmol/L — ABNORMAL HIGH (ref 22–32)
Calcium: 9.4 mg/dL (ref 8.9–10.3)
Chloride: 96 mmol/L — ABNORMAL LOW (ref 98–111)
Creatinine, Ser: 0.69 mg/dL (ref 0.44–1.00)
GFR calc Af Amer: >60 mL/min (ref >60)
GFR calc non Af Amer: >60 mL/min (ref >60)
Glucose, Bld: 87 mg/dL (ref 70–99)
Phosphorus: 3.7 mg/dL (ref 2.5–4.6)
Potassium: 3.3 mmol/L — ABNORMAL LOW (ref 3.5–5.1)
Sodium: 147 mmol/L — ABNORMAL HIGH (ref 135–145)

## 2019-06-15 LAB — PREPARE RBC (CROSSMATCH)

## 2019-06-15 LAB — CBC WITH DIFFERENTIAL/PLATELET
Abs Immature Granulocytes: 0.47 10*3/uL — ABNORMAL HIGH (ref 0.00–0.07)
Basophils Absolute: 0.1 10*3/uL (ref 0.0–0.1)
Basophils Relative: 1 %
Eosinophils Absolute: 0 10*3/uL (ref 0.0–0.5)
Eosinophils Relative: 0 %
HCT: 24.4 % — ABNORMAL LOW (ref 36.0–46.0)
Hemoglobin: 7.5 g/dL — ABNORMAL LOW (ref 12.0–15.0)
Immature Granulocytes: 5 %
Lymphocytes Relative: 17 %
Lymphs Abs: 1.7 10*3/uL (ref 0.7–4.0)
MCH: 28.2 pg (ref 26.0–34.0)
MCHC: 30.7 g/dL (ref 30.0–36.0)
MCV: 91.7 fL (ref 80.0–100.0)
Monocytes Absolute: 0.9 10*3/uL (ref 0.1–1.0)
Monocytes Relative: 8 %
Neutro Abs: 7.2 10*3/uL (ref 1.7–7.7)
Neutrophils Relative %: 69 %
Platelets: 228 10*3/uL (ref 150–400)
RBC: 2.66 MIL/uL — ABNORMAL LOW (ref 3.87–5.11)
RDW: 19.8 % — ABNORMAL HIGH (ref 11.5–15.5)
WBC: 10.3 10*3/uL (ref 4.0–10.5)
nRBC: 0 % (ref 0.0–0.2)

## 2019-06-15 LAB — HEMOGLOBIN AND HEMATOCRIT, BLOOD
HCT: 33.1 % — ABNORMAL LOW (ref 36.0–46.0)
Hemoglobin: 10.4 g/dL — ABNORMAL LOW (ref 12.0–15.0)

## 2019-06-15 LAB — MAGNESIUM: Magnesium: 1.8 mg/dL (ref 1.7–2.4)

## 2019-06-15 MED ORDER — AMIODARONE HCL 200 MG PO TABS
100.0000 mg | ORAL_TABLET | Freq: Every day | ORAL | Status: DC
  Administered 2019-06-15: 10:00:00 100 mg via ORAL
  Filled 2019-06-15: qty 1

## 2019-06-15 MED ORDER — TORSEMIDE 20 MG PO TABS
20.0000 mg | ORAL_TABLET | Freq: Two times a day (BID) | ORAL | Status: DC
  Administered 2019-06-15 – 2019-06-16 (×3): 20 mg via ORAL
  Filled 2019-06-15 (×5): qty 1

## 2019-06-15 MED ORDER — POTASSIUM CHLORIDE CRYS ER 20 MEQ PO TBCR
20.0000 meq | EXTENDED_RELEASE_TABLET | Freq: Two times a day (BID) | ORAL | Status: DC
  Administered 2019-06-15: 10:00:00 20 meq via ORAL
  Filled 2019-06-15 (×2): qty 1

## 2019-06-15 MED ORDER — METOPROLOL TARTRATE 5 MG/5ML IV SOLN
5.0000 mg | Freq: Once | INTRAVENOUS | Status: AC
  Administered 2019-06-15: 19:00:00 5 mg via INTRAVENOUS
  Filled 2019-06-15: qty 5

## 2019-06-15 MED ORDER — SODIUM CHLORIDE 0.9% IV SOLUTION
Freq: Once | INTRAVENOUS | Status: AC
  Administered 2019-06-15: 15:00:00 via INTRAVENOUS

## 2019-06-15 NOTE — Progress Notes (Signed)
Pt refusing to put her O2 on sats 80%. RN at bedside pt alert no distress noted.

## 2019-06-15 NOTE — Progress Notes (Signed)
Patient is refusing to wear nasal cannula at this time. Multiple RNs attempted to persuade her. Her husband was called and he also attempted to have her agree to wear it. We attempted to place a nonrebreather on her, which she also refused. Patient was informed that she could die if she didn't wear her oxygen and she replied that she did not care. 0.25mg  of Xanax was given and Dr. Marylyn Ishihara was paged and updated.

## 2019-06-15 NOTE — Progress Notes (Signed)
Progress Note  Patient Name: Tonya Bradley Date of Encounter: 06/15/2019  Primary Cardiologist: Carlyle Dolly, MD  Primary Electrophysiologist: Dr. Lovena Le  Subjective   No complaints.  BP improved.  In NSR on tele.     Inpatient Medications    Scheduled Meds:  sodium chloride   Intravenous Once   atorvastatin  80 mg Oral q1800   Chlorhexidine Gluconate Cloth  6 each Topical Daily   cycloSPORINE  1 drop Both Eyes BID   DULoxetine  60 mg Oral QHS   fluticasone furoate-vilanterol  1 puff Inhalation Daily   And   umeclidinium bromide  1 puff Inhalation Daily   heparin  5,000 Units Subcutaneous Q8H   ipratropium-albuterol  3 mL Nebulization TID   loratadine  10 mg Oral Daily   magnesium oxide  400 mg Oral BID   mouth rinse  15 mL Mouth Rinse BID   metoCLOPramide  5 mg Oral TID AC   nicotine  21 mg Transdermal Q24H   pantoprazole  40 mg Oral Daily   potassium chloride  20 mEq Oral BID   QUEtiapine  100 mg Oral QHS   rOPINIRole  0.5 mg Oral QHS   Continuous Infusions:  PRN Meds: acetaminophen **OR** acetaminophen, albuterol, ALPRAZolam, ondansetron **OR** ondansetron (ZOFRAN) IV, prochlorperazine, senna-docusate   Vital Signs    Vitals:   06/15/19 0300 06/15/19 0400 06/15/19 0500 06/15/19 0530  BP: (!) 115/52   (!) 126/56  Pulse: 88   79  Resp: 12   16  Temp:  98.2 F (36.8 C)    TempSrc:  Axillary    SpO2: 97%   99%  Weight:   61.9 kg   Height:        Intake/Output Summary (Last 24 hours) at 06/15/2019 2248 Last data filed at 06/14/2019 1900 Gross per 24 hour  Intake 140.48 ml  Output 1150 ml  Net -1009.52 ml   Last 3 Weights 06/15/2019 06/14/2019 06/12/2019  Weight (lbs) 136 lb 7.4 oz 138 lb 10.7 oz 145 lb 4.5 oz  Weight (kg) 61.9 kg 62.9 kg 65.9 kg      Telemetry    NSR - Personally Reviewed  ECG    No new EKG to review - Personally Reviewed  Physical Exam   GEN: Well nourished, well developed in no acute distress HEENT:  Normal NECK: No JVD; No carotid bruits LYMPHATICS: No lymphadenopathy CARDIAC:RRR, no murmurs, rubs, gallops RESPIRATORY:  Clear to auscultation without rales, wheezing or rhonchi  ABDOMEN: Soft, non-tender, non-distended MUSCULOSKELETAL:  No edema; No deformity  SKIN: Warm and dry NEUROLOGIC:  Alert and oriented x 3 PSYCHIATRIC:  Normal affect    Labs    High Sensitivity Troponin:   Recent Labs  Lab 06/12/19 1649 06/12/19 1912  TROPONINIHS 5.0 6.0      Cardiac EnzymesNo results for input(s): TROPONINI in the last 168 hours. No results for input(s): TROPIPOC in the last 168 hours.   Chemistry Recent Labs  Lab 06/09/19 1154  06/12/19 0845 06/13/19 0243 06/14/19 0250 06/15/19 0244  NA 140  --  140 143 145 147*  K 2.7*  --  4.7 3.9 2.8* 3.3*  CL 91*  --  98 95* 92* 96*  CO2 38*  --  30 33* 40* 37*  GLUCOSE 143*  --  98 169* 122* 87  BUN 18  --  39* 32* 20 17  CREATININE 0.93   < > 2.10* 1.18* 0.78 0.69  CALCIUM 9.0  --  8.5* 8.9 9.0 9.4  PROT 6.1*  --  5.6*  --  5.7*  --   ALBUMIN 3.1*  --  2.9*  --  3.5 3.9  AST 22  --  34  --  19  --   ALT 22  --  26  --  21  --   ALKPHOS 187*  --  163*  --  131*  --   BILITOT 0.2*  --  0.3  --  0.3  --   GFRNONAA >60   < > 23* 47* >60 >60  GFRAA >60   < > 27* 54* >60 >60  ANIONGAP 11  --  12 15 13 14    < > = values in this interval not displayed.     Hematology Recent Labs  Lab 06/13/19 0243 06/14/19 0250 06/14/19 1238 06/15/19 0244  WBC 22.7* 16.0*  --  10.3  RBC 3.07* 2.46*  --  2.66*  HGB 8.8* 6.9* 7.8* 7.5*  HCT 28.7* 22.5* 24.1* 24.4*  MCV 93.5 91.5  --  91.7  MCH 28.7 28.0  --  28.2  MCHC 30.7 30.7  --  30.7  RDW 20.1* 19.9*  --  19.8*  PLT 227 246  --  228    BNP Recent Labs  Lab 06/12/19 0845  BNP 2,597.8*     DDimer No results for input(s): DDIMER in the last 168 hours.   Radiology    Ct Angio Chest Pe W Or Wo Contrast  Result Date: 06/13/2019 CLINICAL DATA:  Lethargy. Bilateral lower  extremity swelling. History of small cell lung cancer. EXAM: CT ANGIOGRAPHY CHEST WITH CONTRAST TECHNIQUE: Multidetector CT imaging of the chest was performed using the standard protocol during bolus administration of intravenous contrast. Multiplanar CT image reconstructions and MIPs were obtained to evaluate the vascular anatomy. CONTRAST:  16mL OMNIPAQUE IOHEXOL 350 MG/ML SOLN COMPARISON:  CT scans of the chest from January 11, 2019 and February 15, 2019. Chest x-ray June 12, 2019. PET-CT January 15, 2019. FINDINGS: Cardiovascular: Atherosclerotic changes are seen in the nonaneurysmal aorta. No dissection. Coronary artery calcifications are noted. Cardiomegaly is stable. No pulmonary emboli identified. Mediastinum/Nodes: Small bilateral pleural effusions. No pericardial effusion. The esophagus and thyroid are normal. No adenopathy. Lungs/Pleura: There is debris layering in the trachea. The left mainstem bronchus and left-sided airways are unremarkable. The patient is status post right upper lobectomy. There is narrowing over a short distance at the origin of the right middle lobe bronchus there is mild narrowing in the proximal bronchus intermedius as seen on axial image 53. The remainder of the right-sided airways are unremarkable. No pneumothorax. Mild nodularity in the left apex is is stable, consistent with scarring based on the lack of uptake in this region on previous PET-CT imaging. Scattered ground-glass in the left lung is similar in the interval. These regions of ground-glass could represent focal atelectasis, infection, or inflammation. No new nodules or masses. Small bilateral pleural effusions with underlying atelectasis are identified. Upper Abdomen: No acute abnormality. Musculoskeletal: No chest wall abnormality. No acute or significant osseous findings. Review of the MIP images confirms the above findings. IMPRESSION: 1. No pulmonary emboli identified. 2. There is short segment narrowing of the  right middle lobe airway near its origin. There is mild narrowing of the bronchus intermedius. The narrowings could be postsurgical/therapeutic. No soft tissue mass seen in this region. 3. Small bilateral pleural effusions with underlying atelectasis. 4. Scattered ground-glass opacities in the left lung may represent regions of  focal atelectasis. The finding is unchanged. Infectious or inflammatory causes are possible as well. 5. Atherosclerotic changes in the aorta. Coronary artery calcifications. Aortic Atherosclerosis (ICD10-I70.0). Electronically Signed   By: Dorise Bullion III M.D   On: 06/13/2019 17:09   Vas [place] Lower Extremity Venous (dvt)  Result Date: 06/14/2019  Lower Venous Study Indications: Edema.  Risk Factors: VQ scan negative for PE. Comparison Study: no prior Performing Technologist: June Leap RDMS, RVT  Examination Guidelines: A complete evaluation includes B-mode imaging, spectral Doppler, color Doppler, and power Doppler as needed of all accessible portions of each vessel. Bilateral testing is considered an integral part of a complete examination. Limited examinations for reoccurring indications may be performed as noted.  +---------+---------------+---------+-----------+----------+-------+  RIGHT     Compressibility Phasicity Spontaneity Properties Summary  +---------+---------------+---------+-----------+----------+-------+  CFV       Full            Yes       Yes                             +---------+---------------+---------+-----------+----------+-------+  SFJ       Full                                                      +---------+---------------+---------+-----------+----------+-------+  FV Prox   Full                                                      +---------+---------------+---------+-----------+----------+-------+  FV Mid    Full                                                      +---------+---------------+---------+-----------+----------+-------+  FV Distal Full                                                       +---------+---------------+---------+-----------+----------+-------+  PFV       Full                                                      +---------+---------------+---------+-----------+----------+-------+  POP       Full            Yes       Yes                             +---------+---------------+---------+-----------+----------+-------+  PTV       Full                                                      +---------+---------------+---------+-----------+----------+-------+  PERO      Full                                                      +---------+---------------+---------+-----------+----------+-------+   +---------+---------------+---------+-----------+----------+-------+  LEFT      Compressibility Phasicity Spontaneity Properties Summary  +---------+---------------+---------+-----------+----------+-------+  CFV       Full            Yes       Yes                             +---------+---------------+---------+-----------+----------+-------+  SFJ       Full                                                      +---------+---------------+---------+-----------+----------+-------+  FV Prox   Full                                                      +---------+---------------+---------+-----------+----------+-------+  FV Mid    Full                                                      +---------+---------------+---------+-----------+----------+-------+  FV Distal Full                                                      +---------+---------------+---------+-----------+----------+-------+  PFV       Full                                                      +---------+---------------+---------+-----------+----------+-------+  POP       Full            Yes       Yes                             +---------+---------------+---------+-----------+----------+-------+  PTV       Full                                                       +---------+---------------+---------+-----------+----------+-------+  PERO      Full                                                      +---------+---------------+---------+-----------+----------+-------+  Summary: Right: There is no evidence of deep vein thrombosis in the lower extremity. No cystic structure found in the popliteal fossa. Left: There is no evidence of deep vein thrombosis in the lower extremity. No cystic structure found in the popliteal fossa.  *See table(s) above for measurements and observations. Electronically signed by Monica Martinez MD on 06/14/2019 at 5:07:21 PM.    Final     Cardiac Studies   2D Echo 06/13/19 1. The left ventricle has normal systolic function with an ejection fraction of 60-65%. The cavity size was normal. Indeterminate diastolic filling due to E-A fusion.  2. The right ventricle has normal systolic function. The cavity was normal. There is no increase in right ventricular wall thickness. Right ventricular systolic pressure could not be assessed.  3. Left atrial size was mildly dilated.  4. The aortic valve is abnormal. Moderate calcification of the aortic valve. No stenosis of the aortic valve.  5. The inferior vena cava was dilated in size with <50% respiratory variability.   Patient Profile     Tonya Bradley is a 68 y.o. female with a hx of chronic diastolic congestive heart failure, atrial flutter, coronary artery disease, COPD, small cell lung cancer, tobacco abuse who is being seen today for the evaluation of acute on chronic diastolic congestive heart failure at the request of  Dr. Horris Latino . 7/13>>worsening SOB and hypotension requiring transfer to ICU and pressor support.   Assessment & Plan    1. Acute on Chronic Diastolic CHF:  -she does not appear volume overloaded on exam today.   -she put out 1.15L yesterday and id net neg 3.9L.  Weight is down 2lvs and 9 lbs from admit. -Suspect her increase dyspnea and hypoxia also likely  secondary to worsening anemia. -creatinine stable at 0.69. -will change to PO diuretics.  Since she had been on high dose lasix on admission will change to Torsemide 20mg  BID and follow UOP and renal function.   2. Hypotension:  -hypotension has resolved after PRBC transfusion.  -BP this am 126/7mmHg  3. H/o Atrial Flutter: followed by Dr. Lovena Le.  -S/p EP study and catheter ablation in 2018 and anticoagulation subsequently discontinued shortly after.  -She has been continued on low dose amiodarone.  -remains in NSR this am.  -restart Amio 100mg  daily  4. Anemia: per primary team -she is suspected to have anemia of chronic disease from malignancy.  -No overt bleeding.  -Hbg 7.5 after PRBCs transfusion yesterday  5. Hypokalemia:  -K repleted yesterday but only up to 3.3. -Supplemental K and Mg ordered by primary team.  -Follow and monitor telemetry closely.   6. AKI: resolved. SCr up to 2.10 on 7/11.  -This has returned to normal at 0.69  6.  ASCAD -she denies any CP or SOB -holding BB and CCB while restarting diuretics -no ASA due to anemia -continue statin  For questions or updates, please contact [facility] Please consult [email or web address] for contact info under        Signed, Fransico Him, MD  06/15/2019, 8:08 AM

## 2019-06-15 NOTE — Progress Notes (Addendum)
Marland Kitchen  PROGRESS NOTE    Tonya Bradley  JQB:[id number] DOB: 10-Feb-1951 DOA: 06/12/2019 PCP: [place], [name], MD   Brief Narrative:   Tonya Bradley a 68 y.o.femalewith medical history significant fordiastolic heart failure, atrial flutter, CAD, COPD oxygen dependent, small cell lung cancer, tobacco abuse, presented to the ED complaining of lethargy which is been ongoing past couple of days,bilateral lower extremity edema which has been ongoing for weeks. Of note, patient is currently undergoing chemotherapy with Dr. Earlie Server.Patient has chronic dyspnea, noted to be requiring more oxygen. Patient denies any chest pain, cough, nausea/vomiting, abdominal pain, diarrhea,fever/chills.   Assessment & Plan:   Principal Problem:   Acute on chronic diastolic (congestive) heart failure (HCC) Active Problems:   Atrial flutter (HCC)   CAD (coronary artery disease)   Tobacco abuse   Chronic hypoxemic respiratory failure (HCC)   COPD (chronic obstructive pulmonary disease) (HCC)   Small cell lung cancer, right ([place])   Shock ([place])   AKI (acute kidney injury) ([place])   Acute respiratory failure with hypoxemia (HCC)  Acute on chronic hypoxic respiratory failure - Patient noted to desaturate to as low 70s 80s on 2 L (uses 2L chronically) - at admission she clinically looked overloaded - BNP noted to be 2597, troponin negative, EKG with no acute ST changes - Chest x-ray showed no consolidation or edema - Echo done on 03/2016 showed EF of 55 to 60%, repeat echo: normal systolic function, with an ejection fraction of 60-65%; right ventricle has normal systolic function; Right ventricular systolic pressure could not be assessed; There is no evidence of pericardial effusion. - CTA chest ordered, dopplers negative - cardiology onboard; s/p lasix IV - strict I&O's, daily weights - required HFNC ON, but is on 4L [place] at this time     - CTA PE: no PE; post surgical  changes noted; atelectasis     - On [place] 4L this AM, wean as able  AKI - Renal vascular congestion/cardiorenal/??unlikely intravascular volume depletion - Daily BMP - SCr is 0.78; resolved  Hypotension - Monitor closely in SDU - required levephed last night and remains on it this afternoon - have asked PCCM for back up     - now off pressors and maintaining     - cortisol levels are ok  Chronic leukocytosis - Ongoing, around baseline - Daily CBC  Anemia of chronic disease from malignancy - Likely from chemotherapy, denies any GI bleeding - FOBT pending     - Hgb 6.9 this AM, 1 units pRBCs ordered  Thrombocytopenia - Chronic, ongoing, monitor  Atrial flutter - Rate controlled - Not on any anticoagulation - Held PTA amiodarone, due to low BP - cardiology onboard     - amiodarone resumed; monitor  COPD - Continue supplemental oxygen, inhalers  Small cell lung cancer - Currently on chemotherapy - Followed by Dr. Earlie Server  GERD - PPI  Tobacco abuse - Advised to quit - Nicotine patch  Depression/anxiety - Continue quetiapine, Cymbalta  Sepsis - unknown cause - Bld Cx ordered - CTA PE ordered - vanc/cefepime; currently on levephed - she's afebrile at this time     - abx stopped; pressors off; BP ok  Acute on chronic diastolic HF - BMP 4097 - received lasix IV; hold for now - cardiology onboard, appreciate assistance     - demedex  Hgb about 7.5 this AM after transfusion. No signs of frank bleed. No fevers ON. White count ok. Resuming amio today and starting demedex.  Hopefully with diuresis, we can get her O2 use back to baseline. She's been off pressors 24+ hrs. Let's get her TTF (med-tele). Spoke with her oncologist, Dr Earlie Server, to notify him that she was in-house. He suggested another unit pRBCs. Will order.    DVT  prophylaxis:heparin Code Status:DNR Disposition Plan:TBD  Consultants:   Cardiology  PCCM   Subjective: No acute events ON per nursing  Objective: Vitals:   06/15/19 0530 06/15/19 0800 06/15/19 0848 06/15/19 0900  BP: (!) 126/56 (!) 124/56  125/60  Pulse: 79 91  86  Resp: 16 (!) 21  18  Temp:  98.3 F (36.8 C)    TempSrc:  Oral    SpO2: 99% 96% 98% 97%  Weight:      Height:        Intake/Output Summary (Last 24 hours) at 06/15/2019 1158 Last data filed at 06/15/2019 1000 Gross per 24 hour  Intake 123.39 ml  Output 400 ml  Net -276.61 ml   Filed Weights   06/12/19 1633 06/14/19 0500 06/15/19 0500  Weight: 65.9 kg 62.9 kg 61.9 kg    Examination:  General:68 y.o.femaleresting in bed in NAD Cardiovascular: RRR, +S1, S2, 1/6 SEM, no g/r, equal pulses throughout Respiratory:clear anteriorly but decreased air movement, no accessory muscle use GI: BS+, NDNT, no masses noted, no organomegaly noted MSK: No c/c; slight pedal edema b/l Skin: No rashes, bruises, ulcerations noted Neuro: somnolent    Data Reviewed: I have personally reviewed following labs and imaging studies.  CBC: Recent Labs  Lab 06/09/19 1154 06/12/19 0845 06/13/19 0243 06/14/19 0250 06/14/19 1238 06/15/19 0244  WBC 15.7* 16.9* 22.7* 16.0*  --  10.3  NEUTROABS 9.3* 11.5*  --  11.6*  --  7.2  HGB 8.2* 7.8* 8.8* 6.9* 7.8* 7.5*  HCT 25.8* 26.0* 28.7* 22.5* 24.1* 24.4*  MCV 88.7 94.2 93.5 91.5  --  91.7  PLT 63* 128* 227 246  --  643   Basic Metabolic Panel: Recent Labs  Lab 06/09/19 1154 06/12/19 0845 06/13/19 0243 06/14/19 0250 06/15/19 0244  NA 140 140 143 145 147*  K 2.7* 4.7 3.9 2.8* 3.3*  CL 91* 98 95* 92* 96*  CO2 38* 30 33* 40* 37*  GLUCOSE 143* 98 169* 122* 87  BUN 18 39* 32* 20 17  CREATININE 0.93 2.10* 1.18* 0.78 0.69  CALCIUM 9.0 8.5* 8.9 9.0 9.4  MG 1.5* 2.2  --  1.5* 1.8  PHOS  --   --   --   --  3.7   GFR: Estimated Creatinine Clearance: 57.4 mL/min (by  C-G formula based on SCr of 0.69 mg/dL). Liver Function Tests: Recent Labs  Lab 06/09/19 1154 06/12/19 0845 06/14/19 0250 06/15/19 0244  AST 22 34 19  --   ALT 22 26 21   --   ALKPHOS 187* 163* 131*  --   BILITOT 0.2* 0.3 0.3  --   PROT 6.1* 5.6* 5.7*  --   ALBUMIN 3.1* 2.9* 3.5 3.9   No results for input(s): LIPASE, AMYLASE in the last 168 hours. Recent Labs  Lab 06/12/19 0851  AMMONIA 25   Coagulation Profile: No results for input(s): INR, PROTIME in the last 168 hours. Cardiac Enzymes: No results for input(s): CKTOTAL, CKMB, CKMBINDEX, TROPONINI in the last 168 hours. BNP (last 3 results) No results for input(s): PROBNP in the last 8760 hours. HbA1C: No results for input(s): HGBA1C in the last 72 hours. CBG: No results for input(s): GLUCAP in the last 168  hours. Lipid Profile: No results for input(s): CHOL, HDL, LDLCALC, TRIG, CHOLHDL, LDLDIRECT in the last 72 hours. Thyroid Function Tests: No results for input(s): TSH, T4TOTAL, FREET4, T3FREE, THYROIDAB in the last 72 hours. Anemia Panel: No results for input(s): VITAMINB12, FOLATE, FERRITIN, TIBC, IRON, RETICCTPCT in the last 72 hours. Sepsis Labs: Recent Labs  Lab 06/13/19 1718 06/14/19 0250  PROCALCITON <0.10 <0.10    Recent Results (from the past 240 hour(s))  SARS Coronavirus 2 (CEPHEID - Performed in [facility] hospital lab), Hosp Order     Status: None   Collection Time: 06/12/19 10:59 AM   Specimen: Nasopharyngeal Swab  Result Value Ref Range Status   SARS Coronavirus 2 NEGATIVE NEGATIVE Final    Comment: (NOTE) If result is NEGATIVE SARS-CoV-2 target nucleic acids are NOT DETECTED. The SARS-CoV-2 RNA is generally detectable in upper and lower  respiratory specimens during the acute phase of infection. The lowest  concentration of SARS-CoV-2 viral copies this assay can detect is 250  copies / mL. A negative result does not preclude SARS-CoV-2 infection  and should not be used as the sole basis for  treatment or other  patient management decisions.  A negative result may occur with  improper specimen collection / handling, submission of specimen other  than nasopharyngeal swab, presence of viral mutation(s) within the  areas targeted by this assay, and inadequate number of viral copies  (<250 copies / mL). A negative result must be combined with clinical  observations, patient history, and epidemiological information. If result is POSITIVE SARS-CoV-2 target nucleic acids are DETECTED. The SARS-CoV-2 RNA is generally detectable in upper and lower  respiratory specimens dur ing the acute phase of infection.  Positive  results are indicative of active infection with SARS-CoV-2.  Clinical  correlation with patient history and other diagnostic information is  necessary to determine patient infection status.  Positive results do  not rule out bacterial infection or co-infection with other viruses. If result is PRESUMPTIVE POSTIVE SARS-CoV-2 nucleic acids MAY BE PRESENT.   A presumptive positive result was obtained on the submitted specimen  and confirmed on repeat testing.  While 2019 novel coronavirus  (SARS-CoV-2) nucleic acids may be present in the submitted sample  additional confirmatory testing may be necessary for epidemiological  and / or clinical management purposes  to differentiate between  SARS-CoV-2 and other Sarbecovirus currently known to infect humans.  If clinically indicated additional testing with an alternate test  methodology [phone]) is advised. The SARS-CoV-2 RNA is generally  detectable in upper and lower respiratory sp ecimens during the acute  phase of infection. The expected result is Negative. Fact Sheet for Patients:  StrictlyIdeas.no Fact Sheet for Healthcare Providers: BankingDealers.co.za This test is not yet approved or cleared by the [place] FDA and has been authorized for detection and/or  diagnosis of SARS-CoV-2 by FDA under an Emergency Use Authorization (EUA).  This EUA will remain in effect (meaning this test can be used) for the duration of the COVID-19 declaration under Section 564(b)(1) of the Act, 21 U.S.C. section 360bbb-3(b)(1), unless the authorization is terminated or revoked sooner. Performed at [facility], [place] [address] [place], [place] [address]   MRSA PCR Screening     Status: None   Collection Time: 06/12/19  7:30 PM   Specimen: Nasal Mucosa; Nasopharyngeal  Result Value Ref Range Status   MRSA by PCR NEGATIVE NEGATIVE Final    Comment:        The GeneXpert MRSA  Assay (FDA approved for NASAL specimens only), is one component of a comprehensive MRSA colonization surveillance program. It is not intended to diagnose MRSA infection nor to guide or monitor treatment for MRSA infections. Performed at [facility], [place] [address], [place], [place] [address]   Culture, blood (routine x 2)     Status: None (Preliminary result)   Collection Time: 06/13/19 11:48 AM   Specimen: BLOOD RIGHT WRIST  Result Value Ref Range Status   Specimen Description   Final    BLOOD RIGHT WRIST Performed at [place] [address] [place], [place] [address]    Special Requests   Final    BOTTLES DRAWN AEROBIC ONLY Blood Culture results may not be optimal due to an inadequate volume of blood received in culture bottles Performed at [place] [address], [place], [place] [address]    Culture   Final    NO GROWTH < 24 HOURS Performed at [place] [address] [place], [place] [address]    Report Status PENDING  Incomplete  Culture, blood (routine x 2)     Status: None (Preliminary result)   Collection Time: 06/13/19 11:49 AM   Specimen: BLOOD RIGHT HAND  Result Value Ref Range Status   Specimen Description   Final    BLOOD RIGHT HAND Performed at [place] [address], [place], [place] [address]    Special Requests   Final    BOTTLES DRAWN AEROBIC ONLY Blood Culture results may not be optimal due to an inadequate volume of blood received in culture bottles Performed at [place] [address], [place], [place] [address]    Culture   Final    NO GROWTH < 24 HOURS Performed at [place] [address], [place], [place] [address]    Report Status PENDING  Incomplete         Radiology Studies: Ct Angio Chest Pe W Or Wo Contrast  Result Date: 06/13/2019 CLINICAL DATA:  Lethargy. Bilateral lower extremity swelling. History of small cell lung cancer. EXAM: CT ANGIOGRAPHY CHEST WITH CONTRAST TECHNIQUE: Multidetector CT imaging of the chest was performed using the standard protocol during bolus administration of intravenous contrast. Multiplanar CT image reconstructions and MIPs were obtained to evaluate the vascular anatomy. CONTRAST:  38mL OMNIPAQUE IOHEXOL 350 MG/ML SOLN COMPARISON:  CT scans of the chest from January 11, 2019 and February 15, 2019. Chest x-ray June 12, 2019. PET-CT January 15, 2019. FINDINGS: Cardiovascular: Atherosclerotic changes are seen in the nonaneurysmal aorta. No dissection. Coronary artery calcifications are noted. Cardiomegaly is stable. No pulmonary emboli identified. Mediastinum/Nodes: Small bilateral pleural effusions. No pericardial effusion. The esophagus and thyroid are normal. No adenopathy. Lungs/Pleura: There is debris layering in the trachea. The left mainstem bronchus and left-sided airways are unremarkable. The patient is status post right upper lobectomy. There is narrowing over a short distance at the origin of the right middle lobe bronchus there is mild narrowing in the proximal bronchus intermedius as seen on axial image 53. The remainder of the right-sided airways are unremarkable. No pneumothorax. Mild nodularity in the left apex is is stable, consistent with  scarring based on the lack of uptake in this region on previous PET-CT imaging. Scattered ground-glass in the left lung is similar in the interval. These regions of ground-glass could represent focal atelectasis, infection, or inflammation. No new nodules or masses. Small bilateral pleural effusions  with underlying atelectasis are identified. Upper Abdomen: No acute abnormality. Musculoskeletal: No chest wall abnormality. No acute or significant osseous findings. Review of the MIP images confirms the above findings. IMPRESSION: 1. No pulmonary emboli identified. 2. There is short segment narrowing of the right middle lobe airway near its origin. There is mild narrowing of the bronchus intermedius. The narrowings could be postsurgical/therapeutic. No soft tissue mass seen in this region. 3. Small bilateral pleural effusions with underlying atelectasis. 4. Scattered ground-glass opacities in the left lung may represent regions of focal atelectasis. The finding is unchanged. Infectious or inflammatory causes are possible as well. 5. Atherosclerotic changes in the aorta. Coronary artery calcifications. Aortic Atherosclerosis (ICD10-I70.0). Electronically Signed   By: Dorise Bullion III M.D   On: 06/13/2019 17:09   Vas [place] Lower Extremity Venous (dvt)  Result Date: 06/14/2019  Lower Venous Study Indications: Edema.  Risk Factors: VQ scan negative for PE. Comparison Study: no prior Performing Technologist: June Leap RDMS, RVT  Examination Guidelines: A complete evaluation includes B-mode imaging, spectral Doppler, color Doppler, and power Doppler as needed of all accessible portions of each vessel. Bilateral testing is considered an integral part of a complete examination. Limited examinations for reoccurring indications may be performed as noted.  +---------+---------------+---------+-----------+----------+-------+  RIGHT     Compressibility Phasicity Spontaneity Properties Summary   +---------+---------------+---------+-----------+----------+-------+  CFV       Full            Yes       Yes                             +---------+---------------+---------+-----------+----------+-------+  SFJ       Full                                                      +---------+---------------+---------+-----------+----------+-------+  FV Prox   Full                                                      +---------+---------------+---------+-----------+----------+-------+  FV Mid    Full                                                      +---------+---------------+---------+-----------+----------+-------+  FV Distal Full                                                      +---------+---------------+---------+-----------+----------+-------+  PFV       Full                                                      +---------+---------------+---------+-----------+----------+-------+  POP  Full            Yes       Yes                             +---------+---------------+---------+-----------+----------+-------+  PTV       Full                                                      +---------+---------------+---------+-----------+----------+-------+  PERO      Full                                                      +---------+---------------+---------+-----------+----------+-------+   +---------+---------------+---------+-----------+----------+-------+  LEFT      Compressibility Phasicity Spontaneity Properties Summary  +---------+---------------+---------+-----------+----------+-------+  CFV       Full            Yes       Yes                             +---------+---------------+---------+-----------+----------+-------+  SFJ       Full                                                      +---------+---------------+---------+-----------+----------+-------+  FV Prox   Full                                                      +---------+---------------+---------+-----------+----------+-------+  FV Mid    Full                                                       +---------+---------------+---------+-----------+----------+-------+  FV Distal Full                                                      +---------+---------------+---------+-----------+----------+-------+  PFV       Full                                                      +---------+---------------+---------+-----------+----------+-------+  POP       Full            Yes       Yes                             +---------+---------------+---------+-----------+----------+-------+  PTV       Full                                                      +---------+---------------+---------+-----------+----------+-------+  PERO      Full                                                      +---------+---------------+---------+-----------+----------+-------+     Summary: Right: There is no evidence of deep vein thrombosis in the lower extremity. No cystic structure found in the popliteal fossa. Left: There is no evidence of deep vein thrombosis in the lower extremity. No cystic structure found in the popliteal fossa.  *See table(s) above for measurements and observations. Electronically signed by Monica Martinez MD on 06/14/2019 at 5:07:21 PM.    Final         Scheduled Meds:  sodium chloride   Intravenous Once   amiodarone  100 mg Oral Daily   atorvastatin  80 mg Oral q1800   Chlorhexidine Gluconate Cloth  6 each Topical Daily   cycloSPORINE  1 drop Both Eyes BID   DULoxetine  60 mg Oral QHS   fluticasone furoate-vilanterol  1 puff Inhalation Daily   And   umeclidinium bromide  1 puff Inhalation Daily   heparin  5,000 Units Subcutaneous Q8H   ipratropium-albuterol  3 mL Nebulization TID   loratadine  10 mg Oral Daily   magnesium oxide  400 mg Oral BID   mouth rinse  15 mL Mouth Rinse BID   metoCLOPramide  5 mg Oral TID AC   nicotine  21 mg Transdermal Q24H   pantoprazole  40 mg Oral Daily   potassium chloride  20 mEq Oral BID    QUEtiapine  100 mg Oral QHS   rOPINIRole  0.5 mg Oral QHS   torsemide  20 mg Oral BID   Continuous Infusions:   LOS: 3 days    Time spent: 25 minutes spent in the coordination of care today.    Jonnie Finner, DO [facility] Pager [phone]  If 7PM-7AM, please contact night-coverage [email or web address] Password [facility] 06/15/2019, 11:58 AM

## 2019-06-16 ENCOUNTER — Inpatient Hospital Stay: Payer: Medicare HMO

## 2019-06-16 ENCOUNTER — Inpatient Hospital Stay: Payer: Medicare HMO | Admitting: Physician Assistant

## 2019-06-16 LAB — TYPE AND SCREEN
ABO/RH(D): O NEG
Antibody Screen: NEGATIVE
Unit division: 0
Unit division: 0

## 2019-06-16 LAB — BPAM RBC
Blood Product Expiration Date: 202007182359
Blood Product Expiration Date: 202007312359
ISSUE DATE / TIME: 202007130822
ISSUE DATE / TIME: 202007141418
Unit Type and Rh: 9500
Unit Type and Rh: 9500

## 2019-06-16 LAB — CBC WITH DIFFERENTIAL/PLATELET
Abs Immature Granulocytes: 0.56 10*3/uL — ABNORMAL HIGH (ref 0.00–0.07)
Basophils Absolute: 0.1 10*3/uL (ref 0.0–0.1)
Basophils Relative: 1 %
Eosinophils Absolute: 0 10*3/uL (ref 0.0–0.5)
Eosinophils Relative: 0 %
HCT: 33.5 % — ABNORMAL LOW (ref 36.0–46.0)
Hemoglobin: 10.4 g/dL — ABNORMAL LOW (ref 12.0–15.0)
Immature Granulocytes: 4 %
Lymphocytes Relative: 8 %
Lymphs Abs: 1.1 10*3/uL (ref 0.7–4.0)
MCH: 28.4 pg (ref 26.0–34.0)
MCHC: 31 g/dL (ref 30.0–36.0)
MCV: 91.5 fL (ref 80.0–100.0)
Monocytes Absolute: 1 10*3/uL (ref 0.1–1.0)
Monocytes Relative: 8 %
Neutro Abs: 10.4 10*3/uL — ABNORMAL HIGH (ref 1.7–7.7)
Neutrophils Relative %: 79 %
Platelets: 309 10*3/uL (ref 150–400)
RBC: 3.66 MIL/uL — ABNORMAL LOW (ref 3.87–5.11)
RDW: 18.6 % — ABNORMAL HIGH (ref 11.5–15.5)
WBC: 13.2 10*3/uL — ABNORMAL HIGH (ref 4.0–10.5)
nRBC: 0 % (ref 0.0–0.2)

## 2019-06-16 LAB — RENAL FUNCTION PANEL
Albumin: 4.2 g/dL (ref 3.5–5.0)
Anion gap: 18 — ABNORMAL HIGH (ref 5–15)
BUN: 21 mg/dL (ref 8–23)
CO2: 38 mmol/L — ABNORMAL HIGH (ref 22–32)
Calcium: 9.7 mg/dL (ref 8.9–10.3)
Chloride: 87 mmol/L — ABNORMAL LOW (ref 98–111)
Creatinine, Ser: 0.58 mg/dL (ref 0.44–1.00)
GFR calc Af Amer: >60 mL/min (ref >60)
GFR calc non Af Amer: >60 mL/min (ref >60)
Glucose, Bld: 123 mg/dL — ABNORMAL HIGH (ref 70–99)
Phosphorus: 4 mg/dL (ref 2.5–4.6)
Potassium: 2.9 mmol/L — ABNORMAL LOW (ref 3.5–5.1)
Sodium: 143 mmol/L (ref 135–145)

## 2019-06-16 LAB — MAGNESIUM: Magnesium: 1.7 mg/dL (ref 1.7–2.4)

## 2019-06-16 MED ORDER — POTASSIUM CHLORIDE 10 MEQ/100ML IV SOLN
10.0000 meq | INTRAVENOUS | Status: AC
  Administered 2019-06-16 (×3): 10 meq via INTRAVENOUS
  Filled 2019-06-16 (×4): qty 100

## 2019-06-16 MED ORDER — IPRATROPIUM-ALBUTEROL 0.5-2.5 (3) MG/3ML IN SOLN
3.0000 mL | Freq: Two times a day (BID) | RESPIRATORY_TRACT | Status: DC
  Filled 2019-06-16: qty 3

## 2019-06-16 MED ORDER — DILTIAZEM HCL ER COATED BEADS 120 MG PO CP24: 120.0000 mg | ORAL_CAPSULE | Freq: Every day | ORAL | Status: DC

## 2019-06-16 MED ORDER — POTASSIUM CHLORIDE CRYS ER 20 MEQ PO TBCR
40.0000 meq | EXTENDED_RELEASE_TABLET | ORAL | Status: AC
  Filled 2019-06-16: qty 2

## 2019-06-16 MED ORDER — METOPROLOL TARTRATE 5 MG/5ML IV SOLN
5.0000 mg | Freq: Once | INTRAVENOUS | Status: DC
  Filled 2019-06-16: qty 5

## 2019-06-16 MED ORDER — IPRATROPIUM-ALBUTEROL 0.5-2.5 (3) MG/3ML IN SOLN: 3.0000 mL | RESPIRATORY_TRACT | Status: DC | PRN

## 2019-06-16 NOTE — Progress Notes (Signed)
Patient refusing all medications at this time. Patient also refused all respiratory meds with the respiratory therapist as well. Patient would not give clear answer as to why she would not take her meds. Documented in [facility] that she was not taking her meds. IV medications running at this time.

## 2019-06-16 NOTE — Progress Notes (Signed)
Pt is refusing everything from respiratory. The Pt is clear. RT will continue to moniotor

## 2019-06-16 NOTE — Progress Notes (Signed)
Patient continues to refuse nebulizers and CPT. RT will continue to follow.

## 2019-06-16 NOTE — Progress Notes (Signed)
PROGRESS NOTE    Tonya Bradley  IOE:[id number] DOB: [birth year] DOA: 06/12/2019 PCP: [place], [name], MD    Brief Narrative:  68 y.o.femalewith medical history significant fordiastolic heart failure, atrial flutter, CAD, COPD oxygen dependent, small cell lung cancer, tobacco abuse, presented to the ED complaining of lethargy which is been ongoing past couple of days,bilateral lower extremity edema which has been ongoing for weeks. Of note, patient is currently undergoing chemotherapy with Dr. Earlie Server.Patient has chronic dyspnea, noted to be requiring more oxygen. Patient denies any chest pain, cough, nausea/vomiting, abdominal pain, diarrhea,fever/chills.  Assessment & Plan:   Principal Problem:   Acute on chronic diastolic (congestive) heart failure (HCC) Active Problems:   Atrial flutter (HCC)   CAD (coronary artery disease)   Tobacco abuse   Chronic hypoxemic respiratory failure (HCC)   COPD (chronic obstructive pulmonary disease) (HCC)   Small cell lung cancer, right ([place])   Shock ([place])   AKI (acute kidney injury) ([place])   Acute respiratory failure with hypoxemia (HCC)  Acute on chronic hypoxic respiratory failure - Patient noted to desaturate to as low 70s 80s on 2 L (uses 2L chronically) - at admission she clinically looked overloaded - BNP noted to be 2597, troponin negative, EKG with no acute ST changes - Chest x-ray showed no consolidation or edema - Echo done on 03/2016 showed EF of 55 to 60%, repeat echo: normal systolic function, with an ejection fraction of 60-65%; right ventricle has normal systolic function; Right ventricular systolic pressure could not be assessed; There is no evidence of pericardial effusion. - CTA chest ordered, dopplers negative - cardiology following; s/p lasix IV, now on torsemide - strict I&O's, daily weights - required HFNC ON, remains on 6LNC at present - CTA PE: no PE; post surgical changes  noted; atelectasis     - Cont to wean O2 as tolerated  AKI - Renal vascular congestion/cardiorenal/??unlikely intravascular volume depletion - Daily BMP - AKI since resolved  Hypotension - Monitor closely in SDU - Recently required pressor support, now off pressors as fo 7/13 - cortisol levels are ok  Chronic leukocytosis - Ongoing, around baseline - Afebrile  Anemia of chronic disease from malignancy - Likely from chemotherapy, denies any GI bleeding - FOBT pending - recently received 1 unit of PRBC for Hgb 6.9 - CBC reviewed, stable at present  Thrombocytopenia - Resolved     - Normalized      Atrial flutter - Rate controlled - Not on any anticoagulation - cardiology following     - amiodarone resumed     - Per Cardiology, recommendation for resuming diltiazem for rate control  COPD - Continue supplemental oxygen, inhalers     - Cont to wean O2 as tolerated to goal of 2LNC  Small cell lung cancer - Currently on chemotherapy - Followed by Dr. Earlie Server     - Seems stable at this time  GERD - Cont with PPI  Tobacco abuse - Advised to quit - Nicotine patch  Depression/anxiety - Continue quetiapine, Cymbalta  Sepsis - unknown cause - Bld Cx ordered - CTA PE reviewed with findings of some ground glass opacity - Currently afebrile at this time - Empiric abx stopped; pressors off;  Acute on chronic diastolic HF - BMP 1829 - received lasix IV; hold for now - cardiology onboard, appreciate assistance - cont on torsemide per Cardiology   DVT prophylaxis: Heparin subq Code Status: DNR Family Communication: Pt in room, family not at bedside Disposition Plan: Uncertain  at this time  Consultants:   Cardiology  Critical Care  Procedures:     Antimicrobials: Anti-infectives (From admission, onward)   Start      Dose/Rate Route Frequency Ordered Stop   06/14/19 1700  vancomycin (VANCOCIN) IVPB 750 mg/150 ml premix  Status:  Discontinued     750 mg 150 mL/hr over 60 Minutes Intravenous Every 24 hours 06/13/19 1507 06/14/19 0736   06/13/19 1630  vancomycin (VANCOCIN) 1,250 mg in sodium chloride 0.9 % 250 mL IVPB     1,250 mg 166.7 mL/hr over 90 Minutes Intravenous  Once 06/13/19 1507 06/13/19 1826   06/13/19 1600  ceFEPIme (MAXIPIME) 2 g in sodium chloride 0.9 % 100 mL IVPB  Status:  Discontinued     2 g 200 mL/hr over 30 Minutes Intravenous Every 12 hours 06/13/19 1507 06/14/19 0736       Subjective: Somewhat confused this AM  Objective: Vitals:   06/16/19 1100 06/16/19 1200 06/16/19 1300 06/16/19 1400  BP: (!) 144/65 (!) 146/72 (!) 143/70 140/61  Pulse: 88 84 85   Resp: (!) 23 (!) 21 20 16   Temp:  98 F (36.7 C)    TempSrc:  Oral    SpO2: 94% 100% 95% 95%  Weight:      Height:        Intake/Output Summary (Last 24 hours) at 06/16/2019 1514 Last data filed at 06/16/2019 1400 Gross per 24 hour  Intake 683.03 ml  Output 1180 ml  Net -496.97 ml   Filed Weights   06/12/19 1633 06/14/19 0500 06/15/19 0500  Weight: 65.9 kg 62.9 kg 61.9 kg    Examination:  General exam: Appears calm and comfortable  Respiratory system: Clear to auscultation. Respiratory effort normal. Cardiovascular system: S1 & S2 heard, RRR Gastrointestinal system: Abdomen is nondistended, soft and nontender. No organomegaly or masses felt. Normal bowel sounds heard. Central nervous system: Alert and oriented. No focal neurological deficits. Extremities: Symmetric 5 x 5 power. Skin: No rashes, lesions Psychiatry: Judgement and insight appear normal. Mood & affect appropriate.   Data Reviewed: I have personally reviewed following labs and imaging studies  CBC: Recent Labs  Lab 06/12/19 0845 06/13/19 0243 06/14/19 0250 06/14/19 1238 06/15/19 0244 06/15/19 1925 06/16/19 0248  WBC 16.9* 22.7* 16.0*  --   10.3  --  13.2*  NEUTROABS 11.5*  --  11.6*  --  7.2  --  10.4*  HGB 7.8* 8.8* 6.9* 7.8* 7.5* 10.4* 10.4*  HCT 26.0* 28.7* 22.5* 24.1* 24.4* 33.1* 33.5*  MCV 94.2 93.5 91.5  --  91.7  --  91.5  PLT 128* 227 246  --  228  --  329   Basic Metabolic Panel: Recent Labs  Lab 06/12/19 0845 06/13/19 0243 06/14/19 0250 06/15/19 0244 06/16/19 0248  NA 140 143 145 147* 143  K 4.7 3.9 2.8* 3.3* 2.9*  CL 98 95* 92* 96* 87*  CO2 30 33* 40* 37* 38*  GLUCOSE 98 169* 122* 87 123*  BUN 39* 32* 20 17 21   CREATININE 2.10* 1.18* 0.78 0.69 0.58  CALCIUM 8.5* 8.9 9.0 9.4 9.7  MG 2.2  --  1.5* 1.8 1.7  PHOS  --   --   --  3.7 4.0   GFR: Estimated Creatinine Clearance: 57.4 mL/min (by C-G formula based on SCr of 0.58 mg/dL). Liver Function Tests: Recent Labs  Lab 06/12/19 0845 06/14/19 0250 06/15/19 0244 06/16/19 0248  AST 34 19  --   --   ALT 26  21  --   --   ALKPHOS 163* 131*  --   --   BILITOT 0.3 0.3  --   --   PROT 5.6* 5.7*  --   --   ALBUMIN 2.9* 3.5 3.9 4.2   No results for input(s): LIPASE, AMYLASE in the last 168 hours. Recent Labs  Lab 06/12/19 0851  AMMONIA 25   Coagulation Profile: No results for input(s): INR, PROTIME in the last 168 hours. Cardiac Enzymes: No results for input(s): CKTOTAL, CKMB, CKMBINDEX, TROPONINI in the last 168 hours. BNP (last 3 results) No results for input(s): PROBNP in the last 8760 hours. HbA1C: No results for input(s): HGBA1C in the last 72 hours. CBG: No results for input(s): GLUCAP in the last 168 hours. Lipid Profile: No results for input(s): CHOL, HDL, LDLCALC, TRIG, CHOLHDL, LDLDIRECT in the last 72 hours. Thyroid Function Tests: No results for input(s): TSH, T4TOTAL, FREET4, T3FREE, THYROIDAB in the last 72 hours. Anemia Panel: No results for input(s): VITAMINB12, FOLATE, FERRITIN, TIBC, IRON, RETICCTPCT in the last 72 hours. Sepsis Labs: Recent Labs  Lab 06/13/19 1718 06/14/19 0250  PROCALCITON <0.10 <0.10    Recent  Results (from the past 240 hour(s))  SARS Coronavirus 2 (CEPHEID - Performed in [facility] hospital lab), Hosp Order     Status: None   Collection Time: 06/12/19 10:59 AM   Specimen: Nasopharyngeal Swab  Result Value Ref Range Status   SARS Coronavirus 2 NEGATIVE NEGATIVE Final    Comment: (NOTE) If result is NEGATIVE SARS-CoV-2 target nucleic acids are NOT DETECTED. The SARS-CoV-2 RNA is generally detectable in upper and lower  respiratory specimens during the acute phase of infection. The lowest  concentration of SARS-CoV-2 viral copies this assay can detect is 250  copies / mL. A negative result does not preclude SARS-CoV-2 infection  and should not be used as the sole basis for treatment or other  patient management decisions.  A negative result may occur with  improper specimen collection / handling, submission of specimen other  than nasopharyngeal swab, presence of viral mutation(s) within the  areas targeted by this assay, and inadequate number of viral copies  (<250 copies / mL). A negative result must be combined with clinical  observations, patient history, and epidemiological information. If result is POSITIVE SARS-CoV-2 target nucleic acids are DETECTED. The SARS-CoV-2 RNA is generally detectable in upper and lower  respiratory specimens dur ing the acute phase of infection.  Positive  results are indicative of active infection with SARS-CoV-2.  Clinical  correlation with patient history and other diagnostic information is  necessary to determine patient infection status.  Positive results do  not rule out bacterial infection or co-infection with other viruses. If result is PRESUMPTIVE POSTIVE SARS-CoV-2 nucleic acids MAY BE PRESENT.   A presumptive positive result was obtained on the submitted specimen  and confirmed on repeat testing.  While 2019 novel coronavirus  (SARS-CoV-2) nucleic acids may be present in the submitted sample  additional confirmatory testing may  be necessary for epidemiological  and / or clinical management purposes  to differentiate between  SARS-CoV-2 and other Sarbecovirus currently known to infect humans.  If clinically indicated additional testing with an alternate test  methodology [phone]) is advised. The SARS-CoV-2 RNA is generally  detectable in upper and lower respiratory sp ecimens during the acute  phase of infection. The expected result is Negative. Fact Sheet for Patients:  StrictlyIdeas.no Fact Sheet for Healthcare Providers: BankingDealers.co.za This test is not yet  approved or cleared by the [place] and has been authorized for detection and/or diagnosis of SARS-CoV-2 by FDA under an Emergency Use Authorization (EUA).  This EUA will remain in effect (meaning this test can be used) for the duration of the COVID-19 declaration under Section 564(b)(1) of the Act, 21 U.S.C. section 360bbb-3(b)(1), unless the authorization is terminated or revoked sooner. Performed at [facility], [place] [address] [place], [place] [address]   MRSA PCR Screening     Status: None   Collection Time: 06/12/19  7:30 PM   Specimen: Nasal Mucosa; Nasopharyngeal  Result Value Ref Range Status   MRSA by PCR NEGATIVE NEGATIVE Final    Comment:        The GeneXpert MRSA Assay (FDA approved for NASAL specimens only), is one component of a comprehensive MRSA colonization surveillance program. It is not intended to diagnose MRSA infection nor to guide or monitor treatment for MRSA infections. Performed at [facility], [place] [address] [place], [place] [address]   Culture, blood (routine x 2)     Status: None (Preliminary result)   Collection Time: 06/13/19 11:48 AM   Specimen: BLOOD RIGHT WRIST  Result Value Ref Range Status   Specimen Description   Final    BLOOD RIGHT WRIST Performed at [place] [address],  [place], [place] [address]    Special Requests   Final    BOTTLES DRAWN AEROBIC ONLY Blood Culture results may not be optimal due to an inadequate volume of blood received in culture bottles Performed at [place] [address] [place], [place] [address]    Culture   Final    NO GROWTH 3 DAYS Performed at [facility], [place] [address], [place], [place] [address]    Report Status PENDING  Incomplete  Culture, blood (routine x 2)     Status: None (Preliminary result)   Collection Time: 06/13/19 11:49 AM   Specimen: BLOOD RIGHT HAND  Result Value Ref Range Status   Specimen Description   Final    BLOOD RIGHT HAND Performed at [place] [address] [place], [place] [address]    Special Requests   Final    BOTTLES DRAWN AEROBIC ONLY Blood Culture results may not be optimal due to an inadequate volume of blood received in culture bottles Performed at [place] [address], [place], [place] [address]    Culture   Final    NO GROWTH 3 DAYS Performed at [facility], [place] [address] [place], [place] [address]    Report Status PENDING  Incomplete     Radiology Studies: No results found.  Scheduled Meds: . sodium chloride   Intravenous Once  . amiodarone  100 mg Oral Daily  . atorvastatin  80 mg Oral q1800  . Chlorhexidine Gluconate Cloth  6 each Topical Daily  . cycloSPORINE  1 drop Both Eyes BID  . diltiazem  120 mg Oral Daily  . DULoxetine  60 mg Oral QHS  . fluticasone furoate-vilanterol  1 puff Inhalation Daily   And  . umeclidinium bromide  1 puff Inhalation Daily  . heparin  5,000 Units Subcutaneous Q8H  . ipratropium-albuterol  3 mL Nebulization BID  . loratadine  10 mg Oral Daily  . magnesium oxide  400 mg Oral BID  . mouth rinse  15 mL Mouth Rinse BID  . metoCLOPramide  5 mg Oral  TID AC  . nicotine  21 mg Transdermal Q24H  . pantoprazole  40 mg Oral Daily  . potassium chloride  40 mEq  Oral Q4H  . QUEtiapine  100 mg Oral QHS  . rOPINIRole  0.5 mg Oral QHS  . torsemide  20 mg Oral BID   Continuous Infusions:   LOS: 4 days   Marylu Lund, MD [facility] Pager On Amion  If 7PM-7AM, please contact night-coverage 06/16/2019, 3:14 PM

## 2019-06-16 NOTE — Telephone Encounter (Signed)
This nurse completed this on 05/27/2019.

## 2019-06-16 NOTE — Progress Notes (Addendum)
Progress Note  Patient Name: Tonya Bradley Date of Encounter: 06/16/2019  Primary Cardiologist: Carlyle Dolly, MD   Subjective   Patient has been refusing medications and O2 via [place] intermittently over the past 24 hours. She is oriented to self and place this morning, though could not tell me which hospital she is in. Difficult to get her to engage in conversation. She could not tell me why she was refusing medications. No complaints of chest pain.   Inpatient Medications    Scheduled Meds: . sodium chloride   Intravenous Once  . amiodarone  100 mg Oral Daily  . atorvastatin  80 mg Oral q1800  . Chlorhexidine Gluconate Cloth  6 each Topical Daily  . cycloSPORINE  1 drop Both Eyes BID  . DULoxetine  60 mg Oral QHS  . fluticasone furoate-vilanterol  1 puff Inhalation Daily   And  . umeclidinium bromide  1 puff Inhalation Daily  . heparin  5,000 Units Subcutaneous Q8H  . ipratropium-albuterol  3 mL Nebulization TID  . loratadine  10 mg Oral Daily  . magnesium oxide  400 mg Oral BID  . mouth rinse  15 mL Mouth Rinse BID  . metoCLOPramide  5 mg Oral TID AC  . nicotine  21 mg Transdermal Q24H  . pantoprazole  40 mg Oral Daily  . potassium chloride  20 mEq Oral BID  . QUEtiapine  100 mg Oral QHS  . rOPINIRole  0.5 mg Oral QHS  . torsemide  20 mg Oral BID   Continuous Infusions:  PRN Meds: acetaminophen **OR** acetaminophen, albuterol, ALPRAZolam, ondansetron **OR** ondansetron (ZOFRAN) IV, prochlorperazine, senna-docusate   Vital Signs    Vitals:   06/16/19 0600 06/16/19 0700 06/16/19 0737 06/16/19 0740  BP: (!) 143/73  (!) 145/67   Pulse: 89 80 88 87  Resp: (!) 27 (!) 21 (!) 23 (!) 24  Temp:      TempSrc:      SpO2: 96% 100% 96% 97%  Weight:      Height:        Intake/Output Summary (Last 24 hours) at 06/16/2019 0827 Last data filed at 06/16/2019 0400 Gross per 24 hour  Intake 442.5 ml  Output 1880 ml  Net -1437.5 ml   Filed Weights   06/12/19 1633  06/14/19 0500 06/15/19 0500  Weight: 65.9 kg 62.9 kg 61.9 kg    Telemetry    NSR with suspected paroxysmal atrial flutter with rates in the 130s - Personally Reviewed  Physical Exam   GEN: Sitting upright in bed in no acute distress.   Neck: No JVD, no carotid bruits Cardiac: RRR, no murmurs, rubs, or gallops.  Respiratory: Clear to auscultation bilaterally, no wheezes/ rales/ rhonchi GI: NABS, Soft, nontender, non-distended  MS: No edema; No deformity. Neuro:  A&O x2 (person/place), moving all extremities Psych: Dazed, only makes eye contact when you say her name, answers yes to most questions but does not respond to open ended questions.   Labs    Chemistry Recent Labs  Lab 06/09/19 1154 06/12/19 0845  06/14/19 0250 06/15/19 0244 06/16/19 0248  NA 140 140   < > 145 147* 143  K 2.7* 4.7   < > 2.8* 3.3* 2.9*  CL 91* 98   < > 92* 96* 87*  CO2 38* 30   < > 40* 37* 38*  GLUCOSE 143* 98   < > 122* 87 123*  BUN 18 39*   < > 20 17 21   CREATININE  0.93 2.10*   < > 0.78 0.69 0.58  CALCIUM 9.0 8.5*   < > 9.0 9.4 9.7  PROT 6.1* 5.6*  --  5.7*  --   --   ALBUMIN 3.1* 2.9*  --  3.5 3.9 4.2  AST 22 34  --  19  --   --   ALT 22 26  --  21  --   --   ALKPHOS 187* 163*  --  131*  --   --   BILITOT 0.2* 0.3  --  0.3  --   --   GFRNONAA >60 23*   < > >60 >60 >60  GFRAA >60 27*   < > >60 >60 >60  ANIONGAP 11 12   < > 13 14 18*   < > = values in this interval not displayed.     Hematology Recent Labs  Lab 06/14/19 0250  06/15/19 0244 06/15/19 1925 06/16/19 0248  WBC 16.0*  --  10.3  --  13.2*  RBC 2.46*  --  2.66*  --  3.66*  HGB 6.9*   < > 7.5* 10.4* 10.4*  HCT 22.5*   < > 24.4* 33.1* 33.5*  MCV 91.5  --  91.7  --  91.5  MCH 28.0  --  28.2  --  28.4  MCHC 30.7  --  30.7  --  31.0  RDW 19.9*  --  19.8*  --  18.6*  PLT 246  --  228  --  309   < > = values in this interval not displayed.    Cardiac EnzymesNo results for input(s): TROPONINI in the last 168 hours. No  results for input(s): TROPIPOC in the last 168 hours.   BNP Recent Labs  Lab 06/12/19 0845  BNP 2,597.8*     DDimer No results for input(s): DDIMER in the last 168 hours.   Radiology    No results found.  Cardiac Studies   2D Echo 06/13/19 1. The left ventricle has normal systolic function with an ejection fraction of 60-65%. The cavity size was normal. Indeterminate diastolic filling due to E-A fusion. 2. The right ventricle has normal systolic function. The cavity was normal. There is no increase in right ventricular wall thickness. Right ventricular systolic pressure could not be assessed. 3. Left atrial size was mildly dilated. 4. The aortic valve is abnormal. Moderate calcification of the aortic valve. No stenosis of the aortic valve. 5. The inferior vena cava was dilated in size with <50% respiratory variability.  Patient Profile     Delaney Perona a 68 y.o.femalewith a hx ofchronic diastolic congestive heart failure, atrial flutter, coronary artery disease, COPD, small cell lung cancer, tobacco abusewho is being seen today for the evaluation of acute on chronic diastolic congestive heart failureat the request of Dr. Horris Latino. 7/13>>worsening SOB and hypotension requiring transfer to ICU and pressor support.   Assessment & Plan    1. Acute on chronic diastolic CHF: patient admitted with SOB felt to be multifactorial in the setting of mild volume overload, worsening anemia, and lung cancer. BNP elevated to 2597 on admission. She was diuresed with IV lasix and transitioned to po torsemide yesterday. UOP with net -1.4L in the past 24 hours and -5.3L this admission. Weight pending this morning but down 9lbs from admission on check yesterday. Cr stable at 0.58 today. - Continue torsemide 20mg  BID - Continue to monitor strict I&Os and daily weights  2. Anemia: Hgb up to 10.4 today from  7.5 yesterday after receiving 1u PRBC.   - Continue management per primary team   3. HTN: some hypotension this admission requiring pressors in the setting of acute on chronic anemia. Home diltiazem and metoprolol held. Hypotension resolved after transfusion. Now intermittently hypertensive.  - Favor restarting home diltiazem for BP control  4. Atrial flutter: s/p ablation with Dr. Lovena Le in 2018, no longer on anticoagulation. Amiodarone resumed this admission. With what appears to be paroxysmal atrial flutter with rate in the 130s on telemetry - Favor restarting home diltiazem for rate control - Continue amiodarone for rhythm control  5. Hypokalemia: K 2.9 this morning. Appears patient refused PM repletion last night, though only ordered for 20 mEq x2 yesterday despite K 3.3.  - Will replete aggressively with 10 mEq IV x3 and 40 mEq PO x2 today - may need to consider additional IV potassium if she refuses PO  6. AKI: Resolved. Cr peaked at 2.10 7/11, down to 0.58 today - Continue to monitor with ongoing diuresis  7. CAD: no complaints of chest pain. Not on ASA due to anemia - Continue statin - Resume BBlocker when BP will allow  8. Non-small cell lung cancer: followed by Dr. Earlie Server and currently undergoing chemotherapy. Likely contributing to SOB and hypoxia.  - Continue management per primary team.  9. AMS: possible related to hospitalization. She has been intermittently refusing therapies over the past 24 hours. No evidence of brain mets on MRI brain 04/2019.  - Will defer further work-up/management to primary team  For questions or updates, please contact [place] Please consult [email or web address] for contact info under Cardiology/STEMI.      Signed, Abigail Butts, PA-C  06/16/2019, 8:27 AM   [phone]

## 2019-06-17 ENCOUNTER — Inpatient Hospital Stay: Payer: Medicare HMO

## 2019-06-17 ENCOUNTER — Other Ambulatory Visit: Payer: Self-pay | Admitting: Medical

## 2019-06-17 DIAGNOSIS — I5043 Acute on chronic combined systolic (congestive) and diastolic (congestive) heart failure: Secondary | ICD-10-CM

## 2019-06-17 DIAGNOSIS — I471 Supraventricular tachycardia, unspecified: Secondary | ICD-10-CM

## 2019-06-17 DIAGNOSIS — I5033 Acute on chronic diastolic (congestive) heart failure: Secondary | ICD-10-CM

## 2019-06-17 LAB — COMPREHENSIVE METABOLIC PANEL
ALT: 16 U/L (ref 0–44)
AST: 14 U/L — ABNORMAL LOW (ref 15–41)
Albumin: 3.9 g/dL (ref 3.5–5.0)
Alkaline Phosphatase: 120 U/L (ref 38–126)
Anion gap: 18 — ABNORMAL HIGH (ref 5–15)
BUN: 26 mg/dL — ABNORMAL HIGH (ref 8–23)
CO2: 37 mmol/L — ABNORMAL HIGH (ref 22–32)
Calcium: 9.7 mg/dL (ref 8.9–10.3)
Chloride: 88 mmol/L — ABNORMAL LOW (ref 98–111)
Creatinine, Ser: 0.7 mg/dL (ref 0.44–1.00)
GFR calc Af Amer: >60 mL/min (ref >60)
GFR calc non Af Amer: >60 mL/min (ref >60)
Glucose, Bld: 109 mg/dL — ABNORMAL HIGH (ref 70–99)
Potassium: 2.7 mmol/L — CL (ref 3.5–5.1)
Sodium: 143 mmol/L (ref 135–145)
Total Bilirubin: 1.4 mg/dL — ABNORMAL HIGH (ref 0.3–1.2)
Total Protein: 6.7 g/dL (ref 6.5–8.1)

## 2019-06-17 LAB — TROPONIN I (HIGH SENSITIVITY)
Troponin I (High Sensitivity): 11 ng/L (ref <18)
Troponin I (High Sensitivity): 12 ng/L (ref <18)

## 2019-06-17 LAB — URINALYSIS, ROUTINE W REFLEX MICROSCOPIC
Bilirubin Urine: NEGATIVE
Glucose, UA: NEGATIVE mg/dL
Hgb urine dipstick: NEGATIVE
Ketones, ur: 20 mg/dL — AB
Leukocytes,Ua: NEGATIVE
Nitrite: NEGATIVE
Protein, ur: NEGATIVE mg/dL
Specific Gravity, Urine: 1.011 (ref 1.005–1.030)
pH: 7 (ref 5.0–8.0)

## 2019-06-17 LAB — CBC
HCT: 34.5 % — ABNORMAL LOW (ref 36.0–46.0)
Hemoglobin: 10.9 g/dL — ABNORMAL LOW (ref 12.0–15.0)
MCH: 28.8 pg (ref 26.0–34.0)
MCHC: 31.6 g/dL (ref 30.0–36.0)
MCV: 91 fL (ref 80.0–100.0)
Platelets: 292 10*3/uL (ref 150–400)
RBC: 3.79 MIL/uL — ABNORMAL LOW (ref 3.87–5.11)
RDW: 18.9 % — ABNORMAL HIGH (ref 11.5–15.5)
WBC: 12.4 10*3/uL — ABNORMAL HIGH (ref 4.0–10.5)
nRBC: 0 % (ref 0.0–0.2)

## 2019-06-17 MED ORDER — AMIODARONE LOAD VIA INFUSION
150.0000 mg | Freq: Once | INTRAVENOUS | Status: AC
  Administered 2019-06-17: 01:00:00 150 mg via INTRAVENOUS
  Filled 2019-06-17: qty 83.34

## 2019-06-17 MED ORDER — POTASSIUM CHLORIDE CRYS ER 20 MEQ PO TBCR
40.0000 meq | EXTENDED_RELEASE_TABLET | Freq: Two times a day (BID) | ORAL | Status: DC
  Filled 2019-06-17: qty 2

## 2019-06-17 MED ORDER — SODIUM CHLORIDE 0.9 % IV BOLUS
500.0000 mL | Freq: Once | INTRAVENOUS | Status: AC
  Administered 2019-06-17: 01:00:00 500 mL via INTRAVENOUS

## 2019-06-17 MED ORDER — METOPROLOL TARTRATE 5 MG/5ML IV SOLN
5.0000 mg | Freq: Four times a day (QID) | INTRAVENOUS | Status: DC
  Administered 2019-06-17: 11:00:00 5 mg via INTRAVENOUS
  Filled 2019-06-17: qty 5

## 2019-06-17 MED ORDER — POTASSIUM CHLORIDE 10 MEQ/50ML IV SOLN
10.0000 meq | INTRAVENOUS | Status: AC
  Administered 2019-06-17 (×4): 10 meq via INTRAVENOUS
  Filled 2019-06-17 (×4): qty 50

## 2019-06-17 MED ORDER — DILTIAZEM HCL 100 MG IV SOLR: 5.0000 mg/h | INTRAVENOUS | Status: DC

## 2019-06-17 MED ORDER — AMIODARONE HCL IN DEXTROSE 360-4.14 MG/200ML-% IV SOLN: 30.0000 mg/h | INTRAVENOUS | Status: DC

## 2019-06-17 MED ORDER — SODIUM CHLORIDE 0.9% FLUSH: 10.0000 mL | INTRAVENOUS | Status: DC | PRN

## 2019-06-17 MED ORDER — AMIODARONE HCL IN DEXTROSE 360-4.14 MG/200ML-% IV SOLN
60.0000 mg/h | INTRAVENOUS | Status: AC
  Administered 2019-06-17: 01:00:00 60 mg/h via INTRAVENOUS
  Filled 2019-06-17: qty 200

## 2019-06-17 MED ORDER — LABETALOL HCL 5 MG/ML IV SOLN
5.0000 mg | INTRAVENOUS | Status: DC | PRN
  Administered 2019-06-17: 5 mg via INTRAVENOUS
  Filled 2019-06-17: qty 4

## 2019-06-17 MED ORDER — METOPROLOL TARTRATE 5 MG/5ML IV SOLN
10.0000 mg | Freq: Four times a day (QID) | INTRAVENOUS | Status: DC
  Filled 2019-06-17: qty 10

## 2019-06-17 MED ORDER — FUROSEMIDE 10 MG/ML IJ SOLN
20.0000 mg | Freq: Every day | INTRAMUSCULAR | Status: DC
  Administered 2019-06-17: 11:00:00 20 mg via INTRAVENOUS
  Filled 2019-06-17: qty 2

## 2019-06-17 MED ORDER — DILTIAZEM HCL 100 MG IV SOLR
5.0000 mg/h | INTRAVENOUS | Status: AC
  Administered 2019-06-17: 07:00:00 5 mg/h via INTRAVENOUS
  Administered 2019-06-17: 20:00:00 10 mg/h via INTRAVENOUS
  Filled 2019-06-17 (×2): qty 100

## 2019-06-17 NOTE — Progress Notes (Signed)
PROGRESS NOTE    Tonya Bradley  ZOX:[id number] DOB: 09/11/1951 DOA: 06/12/2019 PCP: [place], [name], MD    Brief Narrative:  68 y.o.femalewith medical history significant fordiastolic heart failure, atrial flutter, CAD, COPD oxygen dependent, small cell lung cancer, tobacco abuse, presented to the ED complaining of lethargy which is been ongoing past couple of days,bilateral lower extremity edema which has been ongoing for weeks. Of note, patient is currently undergoing chemotherapy with Dr. Earlie Server.Patient has chronic dyspnea, noted to be requiring more oxygen. Patient denies any chest pain, cough, nausea/vomiting, abdominal pain, diarrhea,fever/chills.  Assessment & Plan:   Principal Problem:   Acute on chronic diastolic (congestive) heart failure (HCC) Active Problems:   Atrial flutter (HCC)   CAD (coronary artery disease)   Tobacco abuse   Chronic hypoxemic respiratory failure (HCC)   COPD (chronic obstructive pulmonary disease) (HCC)   Small cell lung cancer, right ([place])   Shock ([place])   AKI (acute kidney injury) ([place])   Acute respiratory failure with hypoxemia (HCC)   SVT (supraventricular tachycardia) ([place])  Acute on chronic hypoxic respiratory failure - Patient noted to desaturate to as low 70s 80s on 2 L (uses 2L chronically) - at admission she clinically looked overloaded - BNP noted to be 2597, troponin negative, EKG with no acute ST changes - Chest x-ray showed no consolidation or edema - Echo done on 03/2016 showed EF of 55 to 60%, repeat echo: normal systolic function, with an ejection fraction of 60-65%; right ventricle has normal systolic function; Right ventricular systolic pressure could not be assessed; There is no evidence of pericardial effusion. - CTA chest ordered, dopplers negative - cardiology was consulted and pt was diuresed. Cardiology since signed off - required HFNC ON, remains on [facility] currently despite aggressive  diuresis - CTA PE: no PE; post surgical changes noted; atelectasis     - Remains difficult to wean.  AKI - Renal vascular congestion/cardiorenal/??unlikely intravascular volume depletion - Daily BMP - AKI has since resolved  Hypotension - Monitor closely in SDU - Recently required pressor support, now off pressors as fo 7/13 - cortisol levels noted to be unremarkable  Chronic leukocytosis - Ongoing, around baseline - WBC mildly elevated  Anemia of chronic disease from malignancy - Likely from chemotherapy, denies any GI bleeding - FOBT pending - recently received 1 unit of PRBC for Hgb 6.9      - CBC reviewed, hgb remained stable  Thrombocytopenia - Resolved     - No signs of acute bleed      Atrial flutter - Rate controlled - Not on any anticoagulation - Cardiology had been following      - Pt now refusing meds and PO, see below     - Currently on cardizem gtt and scheduled IV beta blocker  COPD - Continue supplemental oxygen, inhalers     - difficult to wean o2 below 5LNC  Small cell lung cancer - Currently on chemotherapy - Followed by Dr. Earlie Server     - Outpt records reviewed. Pt did not tolerate chemo well with complaints of mouth pain, nausea, and abd pain  GERD - Cont with PPI  Tobacco abuse - Advised to quit - Continued on nicotine patch  Depression/anxiety - On quetiapine, Cymbalta, however pt is refusing meds  Sepsis - unknown cause - Bld Cx ordered - CTA PE reviewed with findings of some ground glass opacity - Currently afebrile at this time - Empiric abx stopped; pressors off;  Acute on chronic diastolic  HF - BMP 2597 - received lasix IV; hold for now - cardiology onboard, appreciate assistance - cont on torsemide per Cardiology  End of Life     - Pt now refusing PO meds, resp treatments, O2, and  PO intake     - Discussed with patient's husband over phone who states prior to admit, pt had been steadily declining and even refusing food     - Prognosis is likely less than 2 weeks at this point     - Pt currently DNR. Family is agreeable to discussing with Palliative Care for [place]     - Suspect pt would benefit from residential hospice  DVT prophylaxis: Heparin subq Code Status: DNR Family Communication: Pt in room, discussed with patient's husband over phone Disposition Plan: Uncertain at this time  Consultants:   Cardiology  Critical Care  Procedures:     Antimicrobials: Anti-infectives (From admission, onward)   Start     Dose/Rate Route Frequency Ordered Stop   06/14/19 1700  vancomycin (VANCOCIN) IVPB 750 mg/150 ml premix  Status:  Discontinued     750 mg 150 mL/hr over 60 Minutes Intravenous Every 24 hours 06/13/19 1507 06/14/19 0736   06/13/19 1630  vancomycin (VANCOCIN) 1,250 mg in sodium chloride 0.9 % 250 mL IVPB     1,250 mg 166.7 mL/hr over 90 Minutes Intravenous  Once 06/13/19 1507 06/13/19 1826   06/13/19 1600  ceFEPIme (MAXIPIME) 2 g in sodium chloride 0.9 % 100 mL IVPB  Status:  Discontinued     2 g 200 mL/hr over 30 Minutes Intravenous Every 12 hours 06/13/19 1507 06/14/19 0736      Subjective: Remains confused   Objective: Vitals:   06/17/19 1400 06/17/19 1444 06/17/19 1500 06/17/19 1600  BP: 134/67  126/78 137/90  Pulse: 80 (!) 133 93 (!) 135  Resp: (!) 21 20 (!) 24 (!) 23  Temp:    98 F (36.7 C)  TempSrc:    Oral  SpO2: 90% 97% 96% 97%  Weight:      Height:        Intake/Output Summary (Last 24 hours) at 06/17/2019 1611 Last data filed at 06/17/2019 1547 Gross per 24 hour  Intake 138.98 ml  Output 2125 ml  Net -1986.02 ml   Filed Weights   06/12/19 1633 06/14/19 0500 06/15/19 0500  Weight: 65.9 kg 62.9 kg 61.9 kg    Examination: General exam: Awake, laying in bed, in nad Respiratory system: Normal respiratory effort, no  wheezing Cardiovascular system: regular rate, s1, s2 Gastrointestinal system: Soft, nondistended, positive BS Central nervous system: CN2-12 grossly intact, strength intact Extremities: Perfused, no clubbing Skin: Normal skin turgor, no notable skin lesions seen Psychiatry: Difficult to assess given pt not speaking  Data Reviewed: I have personally reviewed following labs and imaging studies  CBC: Recent Labs  Lab 06/12/19 0845 06/13/19 0243 06/14/19 0250 06/14/19 1238 06/15/19 0244 06/15/19 1925 06/16/19 0248 06/17/19 0038  WBC 16.9* 22.7* 16.0*  --  10.3  --  13.2* 12.4*  NEUTROABS 11.5*  --  11.6*  --  7.2  --  10.4*  --   HGB 7.8* 8.8* 6.9* 7.8* 7.5* 10.4* 10.4* 10.9*  HCT 26.0* 28.7* 22.5* 24.1* 24.4* 33.1* 33.5* 34.5*  MCV 94.2 93.5 91.5  --  91.7  --  91.5 91.0  PLT 128* 227 246  --  228  --  309 891   Basic Metabolic Panel: Recent Labs  Lab 06/12/19 0845 06/13/19 0243 06/14/19 0250  06/15/19 0244 06/16/19 0248 06/17/19 0038  NA 140 143 145 147* 143 143  K 4.7 3.9 2.8* 3.3* 2.9* 2.7*  CL 98 95* 92* 96* 87* 88*  CO2 30 33* 40* 37* 38* 37*  GLUCOSE 98 169* 122* 87 123* 109*  BUN 39* 32* 20 17 21  26*  CREATININE 2.10* 1.18* 0.78 0.69 0.58 0.70  CALCIUM 8.5* 8.9 9.0 9.4 9.7 9.7  MG 2.2  --  1.5* 1.8 1.7  --   PHOS  --   --   --  3.7 4.0  --    GFR: Estimated Creatinine Clearance: 57.4 mL/min (by C-G formula based on SCr of 0.7 mg/dL). Liver Function Tests: Recent Labs  Lab 06/12/19 0845 06/14/19 0250 06/15/19 0244 06/16/19 0248 06/17/19 0038  AST 34 19  --   --  14*  ALT 26 21  --   --  16  ALKPHOS 163* 131*  --   --  120  BILITOT 0.3 0.3  --   --  1.4*  PROT 5.6* 5.7*  --   --  6.7  ALBUMIN 2.9* 3.5 3.9 4.2 3.9   No results for input(s): LIPASE, AMYLASE in the last 168 hours. Recent Labs  Lab 06/12/19 0851  AMMONIA 25   Coagulation Profile: No results for input(s): INR, PROTIME in the last 168 hours. Cardiac Enzymes: No results for  input(s): CKTOTAL, CKMB, CKMBINDEX, TROPONINI in the last 168 hours. BNP (last 3 results) No results for input(s): PROBNP in the last 8760 hours. HbA1C: No results for input(s): HGBA1C in the last 72 hours. CBG: No results for input(s): GLUCAP in the last 168 hours. Lipid Profile: No results for input(s): CHOL, HDL, LDLCALC, TRIG, CHOLHDL, LDLDIRECT in the last 72 hours. Thyroid Function Tests: No results for input(s): TSH, T4TOTAL, FREET4, T3FREE, THYROIDAB in the last 72 hours. Anemia Panel: No results for input(s): VITAMINB12, FOLATE, FERRITIN, TIBC, IRON, RETICCTPCT in the last 72 hours. Sepsis Labs: Recent Labs  Lab 06/13/19 1718 06/14/19 0250  PROCALCITON <0.10 <0.10    Recent Results (from the past 240 hour(s))  SARS Coronavirus 2 (CEPHEID - Performed in [facility] hospital lab), Hosp Order     Status: None   Collection Time: 06/12/19 10:59 AM   Specimen: Nasopharyngeal Swab  Result Value Ref Range Status   SARS Coronavirus 2 NEGATIVE NEGATIVE Final    Comment: (NOTE) If result is NEGATIVE SARS-CoV-2 target nucleic acids are NOT DETECTED. The SARS-CoV-2 RNA is generally detectable in upper and lower  respiratory specimens during the acute phase of infection. The lowest  concentration of SARS-CoV-2 viral copies this assay can detect is 250  copies / mL. A negative result does not preclude SARS-CoV-2 infection  and should not be used as the sole basis for treatment or other  patient management decisions.  A negative result may occur with  improper specimen collection / handling, submission of specimen other  than nasopharyngeal swab, presence of viral mutation(s) within the  areas targeted by this assay, and inadequate number of viral copies  (<250 copies / mL). A negative result must be combined with clinical  observations, patient history, and epidemiological information. If result is POSITIVE SARS-CoV-2 target nucleic acids are DETECTED. The SARS-CoV-2 RNA is  generally detectable in upper and lower  respiratory specimens dur ing the acute phase of infection.  Positive  results are indicative of active infection with SARS-CoV-2.  Clinical  correlation with patient history and other diagnostic information is  necessary to determine  patient infection status.  Positive results do  not rule out bacterial infection or co-infection with other viruses. If result is PRESUMPTIVE POSTIVE SARS-CoV-2 nucleic acids MAY BE PRESENT.   A presumptive positive result was obtained on the submitted specimen  and confirmed on repeat testing.  While 2019 novel coronavirus  (SARS-CoV-2) nucleic acids may be present in the submitted sample  additional confirmatory testing may be necessary for epidemiological  and / or clinical management purposes  to differentiate between  SARS-CoV-2 and other Sarbecovirus currently known to infect humans.  If clinically indicated additional testing with an alternate test  methodology [phone]) is advised. The SARS-CoV-2 RNA is generally  detectable in upper and lower respiratory sp ecimens during the acute  phase of infection. The expected result is Negative. Fact Sheet for Patients:  StrictlyIdeas.no Fact Sheet for Healthcare Providers: BankingDealers.co.za This test is not yet approved or cleared by the [place] FDA and has been authorized for detection and/or diagnosis of SARS-CoV-2 by FDA under an Emergency Use Authorization (EUA).  This EUA will remain in effect (meaning this test can be used) for the duration of the COVID-19 declaration under Section 564(b)(1) of the Act, 21 U.S.C. section 360bbb-3(b)(1), unless the authorization is terminated or revoked sooner. Performed at [facility] [place] [address], [place], [place] [address]   MRSA PCR Screening     Status: None   Collection Time: 06/12/19  7:30 PM   Specimen: Nasal Mucosa; Nasopharyngeal   Result Value Ref Range Status   MRSA by PCR NEGATIVE NEGATIVE Final    Comment:        The GeneXpert MRSA Assay (FDA approved for NASAL specimens only), is one component of a comprehensive MRSA colonization surveillance program. It is not intended to diagnose MRSA infection nor to guide or monitor treatment for MRSA infections. Performed at [facility], [place] [address], [place], [place] [address]   Culture, blood (routine x 2)     Status: None (Preliminary result)   Collection Time: 06/13/19 11:48 AM   Specimen: BLOOD RIGHT WRIST  Result Value Ref Range Status   Specimen Description   Final    BLOOD RIGHT WRIST Performed at [facility] [address] [place], [place] [address]    Special Requests   Final    BOTTLES DRAWN AEROBIC ONLY Blood Culture results may not be optimal due to an inadequate volume of blood received in culture bottles Performed at [facility] [address] [place], [place] [address]    Culture   Final    NO GROWTH 4 DAYS Performed at [facility], [place] [address], [place], [place] [address]    Report Status PENDING  Incomplete  Culture, blood (routine x 2)     Status: None (Preliminary result)   Collection Time: 06/13/19 11:49 AM   Specimen: BLOOD RIGHT HAND  Result Value Ref Range Status   Specimen Description   Final    BLOOD RIGHT HAND Performed at [facility] [address], [place], [place] [address]    Special Requests   Final    BOTTLES DRAWN AEROBIC ONLY Blood Culture results may not be optimal due to an inadequate volume of blood received in culture bottles Performed at [facility] [place] [address] [place], [place] [address]    Culture   Final    NO GROWTH 4 DAYS Performed at [facility], [place],  [place] [address]    Report Status PENDING  Incomplete     Radiology Studies: No results found.  Scheduled Meds: .  atorvastatin  80 mg Oral q1800  . Chlorhexidine Gluconate Cloth  6 each Topical Daily  . cycloSPORINE  1 drop Both Eyes BID  . DULoxetine  60 mg Oral QHS  . fluticasone furoate-vilanterol  1 puff Inhalation Daily   And  . umeclidinium bromide  1 puff Inhalation Daily  . furosemide  20 mg Intravenous Daily  . heparin  5,000 Units Subcutaneous Q8H  . loratadine  10 mg Oral Daily  . magnesium oxide  400 mg Oral BID  . mouth rinse  15 mL Mouth Rinse BID  . metoCLOPramide  5 mg Oral TID AC  . metoprolol tartrate  10 mg Intravenous Q6H  . metoprolol tartrate  5 mg Intravenous Once  . nicotine  21 mg Transdermal Q24H  . pantoprazole  40 mg Oral Daily  . QUEtiapine  100 mg Oral QHS  . rOPINIRole  0.5 mg Oral QHS   Continuous Infusions: . diltiazem (CARDIZEM) infusion 10 mg/hr (06/17/19 1547)     LOS: 5 days   Marylu Lund, MD [facility] Pager On Amion  If 7PM-7AM, please contact night-coverage 06/17/2019, 4:11 PM

## 2019-06-17 NOTE — Progress Notes (Signed)
CRITICAL VALUE ALERT  Critical Value: Potassium 2.7  Date & Time Notied:  06/17/19 0148  Provider Notified: Georges Mouse MD   Orders Received/Actions taken: See new orders

## 2019-06-17 NOTE — Treatment Plan (Signed)
BRIEF OVERNIGHT CARDIOLOGY NOTE  Tonya Bradley is 68 y.o.femalewith medical history significant fordiastolic heart failure, atrial flutter, CAD, COPD oxygen dependent, small cell lung cancer, tobacco abuse whom the cardiology consult service is following for management of her atrial arrhythmias.   Cardiology was paged overnight by nursing team as patient had reverted back to AFL with rates in the 130-140s with BPs that ranged from systolics of 013 to the 14H. Patient had reportedly been refusing most of her meds for most of the day, but had asked for something to help her sleep. Patient was given 500cc per hospitalist team with some improvement in her systolic pressures. Decision was made rebolus patient with amiodarone and start gtt, given her lack of much BP room and the fact that she had already been taking it. I do not anticipate that she will need a full 5g load however and once her rate/rhythm is better controlled, she will likely be able to transition back to PO amiodarone, assuming she is willing to take it.

## 2019-06-17 NOTE — Progress Notes (Addendum)
Tonya Bradley was in Afib/ Flutter with RVR with hypotension Ns 533mL iv x2 given with improvement in bp  Bradley started on amiodarone iv, lost her access but had converted to sinus. Per RN currently in SVT ?  Bradley is asymptomatic Bradley also has been refusing her oral medications yesterday per RN, typically receives cardizem po   Exam: T 98.1  P 134 R 19  Bp 133/83  Pox 90%-99% w o2 [place]  Heent: anicteric Neck: no jvd Heart: tachy s1, s2,  Lung: ctab Abd: soft, obese Ext: no c/c/ 1+ edema  Trop I x2 6-> 11  A/P Tachycardia, ?SVT 12 lead ekg Tried valsalva and carotid massage w no response.  Start cardizem gtt since not taking her oral cardizem If tachycardia persistent consider repeat CTA chest r/o PE  Hypotension  resolved with 1L Ns iv x1  Aflutter/ Afib w RVR overnite Improved with amiodarone iv briefly Currently not on anticoagulation, defer to cardiology/ primary team   Critical care time 30 minutes

## 2019-06-17 NOTE — Progress Notes (Signed)
Patient refusing to take PO meds again today as well as respiratory meds. She will not give a clear reason why she does not want them. Documented in [employer]. Patient oriented at this time.

## 2019-06-17 NOTE — Progress Notes (Signed)
Xcover Afib with RVR per RN, pt has refused most of her oral medication today. Metoprolol 5mg  iv ordered but not given due to  bp 64/49 per RN Pt asymptomatic,  Rn curious whether her sleeping medication may be contributing,  Pt somnolent, asleep.   A/P Afib with RVR Hypotension Tele Trop I q2h x3 12 lead ekg Ns 56mL iv x1 bolus for low bp Defer to cardiology regarding rate control agent since they are following, appreciate input.

## 2019-06-17 NOTE — Progress Notes (Signed)
This RN paged [place] around 2330 to inform on-call practitioner of patient's heart rate sustaining between 105-120, now in Afib from NSR. Dr. Maudie Mercury ordered metoprolol 5mg  IV push once, when this RN obtained patient's BP it was in the 75'Z systolic x2. Dr. Maudie Mercury informed of patient's BP. Metoprolol not given. New order received to bolus and cardiology was paged at this time. EKG obtained during this time -- reported to MD. Cardiology placed orders for amiodarone drip. Patient resting in bed with eyes closed. No signs of distress noted. Will continue to monitor patient.

## 2019-06-18 ENCOUNTER — Inpatient Hospital Stay: Payer: Medicare HMO

## 2019-06-18 DIAGNOSIS — Z7189 Other specified counseling: Secondary | ICD-10-CM

## 2019-06-18 DIAGNOSIS — Z515 Encounter for palliative care: Secondary | ICD-10-CM

## 2019-06-18 LAB — CULTURE, BLOOD (ROUTINE X 2)
Culture: NO GROWTH
Culture: NO GROWTH

## 2019-06-18 MED ORDER — DILTIAZEM HCL 30 MG PO TABS
30.0000 mg | ORAL_TABLET | Freq: Four times a day (QID) | ORAL | Status: DC
  Administered 2019-06-18 – 2019-06-19 (×5): 30 mg via ORAL
  Filled 2019-06-18 (×5): qty 1

## 2019-06-18 MED ORDER — IPRATROPIUM-ALBUTEROL 0.5-2.5 (3) MG/3ML IN SOLN
3.0000 mL | Freq: Three times a day (TID) | RESPIRATORY_TRACT | Status: DC
  Administered 2019-06-18 – 2019-06-19 (×2): 3 mL via RESPIRATORY_TRACT
  Filled 2019-06-18 (×2): qty 3

## 2019-06-18 MED ORDER — TORSEMIDE 20 MG PO TABS
20.0000 mg | ORAL_TABLET | Freq: Two times a day (BID) | ORAL | Status: DC
  Administered 2019-06-18 – 2019-06-19 (×4): 20 mg via ORAL
  Filled 2019-06-18 (×6): qty 1

## 2019-06-18 MED ORDER — METOPROLOL TARTRATE 25 MG PO TABS
25.0000 mg | ORAL_TABLET | Freq: Two times a day (BID) | ORAL | Status: DC
  Administered 2019-06-18 – 2019-06-19 (×3): 25 mg via ORAL
  Filled 2019-06-18 (×3): qty 1

## 2019-06-18 MED ORDER — METHYLPREDNISOLONE SODIUM SUCC 40 MG IJ SOLR
40.0000 mg | Freq: Two times a day (BID) | INTRAMUSCULAR | Status: DC
  Administered 2019-06-18 – 2019-06-19 (×3): 40 mg via INTRAVENOUS
  Filled 2019-06-18 (×3): qty 1

## 2019-06-18 MED ORDER — IPRATROPIUM-ALBUTEROL 0.5-2.5 (3) MG/3ML IN SOLN
3.0000 mL | Freq: Four times a day (QID) | RESPIRATORY_TRACT | Status: DC
  Administered 2019-06-18: 13:00:00 3 mL via RESPIRATORY_TRACT
  Filled 2019-06-18: qty 3

## 2019-06-18 MED ORDER — IPRATROPIUM-ALBUTEROL 0.5-2.5 (3) MG/3ML IN SOLN: 3.0000 mL | RESPIRATORY_TRACT | Status: DC | PRN

## 2019-06-18 NOTE — Progress Notes (Signed)
PROGRESS NOTE    Tonya Bradley  ZJI:[id number] DOB: 06/16/1951 DOA: 06/12/2019 PCP: [place], [name], MD    Brief Narrative:  68 y.o.femalewith medical history significant fordiastolic heart failure, atrial flutter, CAD, COPD oxygen dependent, small cell lung cancer, tobacco abuse, presented to the ED complaining of lethargy which is been ongoing past couple of days,bilateral lower extremity edema which has been ongoing for weeks. Of note, patient is currently undergoing chemotherapy with Dr. Earlie Bradley.Patient has chronic dyspnea, noted to be requiring more oxygen. Patient denies any chest pain, cough, nausea/vomiting, abdominal pain, diarrhea,fever/chills.  Assessment & Plan:   Principal Problem:   Acute on chronic diastolic (congestive) heart failure (HCC) Active Problems:   Atrial flutter (HCC)   CAD (coronary artery disease)   Tobacco abuse   Chronic hypoxemic respiratory failure (HCC)   COPD (chronic obstructive pulmonary disease) (HCC)   Small cell lung cancer, right ([place])   Shock ([place])   AKI (acute kidney injury) ([place])   Acute respiratory failure with hypoxemia (HCC)   SVT (supraventricular tachycardia) ([place])  Acute on chronic hypoxic respiratory failure - Patient noted to desaturate to as low 70s 80s on 2 L (uses 2L chronically) - at admission she clinically looked overloaded - BNP noted to be 2597, troponin negative, EKG with no acute ST changes - Chest x-ray showed no consolidation or edema - Echo done on 03/2016 showed EF of 55 to 60%, repeat echo: normal systolic function, with an ejection fraction of 60-65%; right ventricle has normal systolic function; Right ventricular systolic pressure could not be assessed; There is no evidence of pericardial effusion. - CTA chest ordered, dopplers negative - cardiology was consulted and pt was diuresed. Cardiology since signed off - required HFNC ON, remains on 5LNC currently despite aggressive  diuresis - CTA PE: no PE; post surgical changes noted; atelectasis     - Weaning O2 to [facility]  AKI - Renal vascular congestion/cardiorenal/??unlikely intravascular volume depletion - Daily BMP - AKI resolved  Hypotension - Monitor closely in SDU - Recently required pressor support, now off pressors as fo 7/13 - cortisol levels noted to be unremarkable    - BP stable  Chronic leukocytosis - Ongoing, around baseline - Stable, afebrile  Anemia of chronic disease from malignancy - Likely from chemotherapy, denies any GI bleeding - FOBT pending - recently received 1 unit of PRBC for Hgb 6.9      - CBC reviewed, hgb has remained stable thus far  Thrombocytopenia - Resolved     - No evidence of bleeding      Atrial flutter - Now rate controlled - Not on any anticoagulation - Cardiology had been following      - Pt had been refusing meds and PO, now agreeable     - now off cardizem gtt to PO cardizem with beta blocker  COPD - Continue supplemental oxygen, inhalers     - Now on [facility]  Small cell lung cancer - Currently on chemotherapy - Followed by Dr. Earlie Bradley     - Outpt records reviewed. Pt did not tolerate chemo well with complaints of mouth pain, nausea, and abd pain  GERD - Cont with PPI  Tobacco abuse - Advised to quit - Continued on nicotine patch  Depression/anxiety - On quetiapine, Cymbalta as tolerated  Sepsis - unknown cause - Bld Cx ordered - CTA PE reviewed with findings of some ground glass opacity - Currently afebrile at this time - Empiric abx stopped; pressors off;  Acute on  chronic diastolic HF - BMP 6712 - received lasix IV; hold for now - cardiology had been on board, appreciate assistance - cont on PO torsemide per Cardiology  End of Life     - Pt recently refused meds and PO with poor prognosis     - This AM, pt more awake and interactive, taking medicaitons as directed     -Also able to wean down O2    - Given gradual decline prior to admit and known small cell lung CA, would still benefit from Palliative Care consultation  DVT prophylaxis: Heparin subq Code Status: DNR Family Communication: Pt in room, family not at bedside Disposition Plan: Uncertain at this time  Consultants:   Cardiology  Critical Care  Palliative Care  Procedures:     Antimicrobials: Anti-infectives (From admission, onward)   Start     Dose/Rate Route Frequency Ordered Stop   06/14/19 1700  vancomycin (VANCOCIN) IVPB 750 mg/150 ml premix  Status:  Discontinued     750 mg 150 mL/hr over 60 Minutes Intravenous Every 24 hours 06/13/19 1507 06/14/19 0736   06/13/19 1630  vancomycin (VANCOCIN) 1,250 mg in sodium chloride 0.9 % 250 mL IVPB     1,250 mg 166.7 mL/hr over 90 Minutes Intravenous  Once 06/13/19 1507 06/13/19 1826   06/13/19 1600  ceFEPIme (MAXIPIME) 2 Bradley in sodium chloride 0.9 % 100 mL IVPB  Status:  Discontinued     2 Bradley 200 mL/hr over 30 Minutes Intravenous Every 12 hours 06/13/19 1507 06/14/19 0736      Subjective: More oriented, wanting to go home  Objective: Vitals:   06/18/19 1300 06/18/19 1316 06/18/19 1500 06/18/19 1600  BP: 99/69  (!) 107/50 (!) 127/59  Pulse: 63  69 70  Resp: (!) 21  (!) 25 (!) 22  Temp:    97.8 F (36.6 C)  TempSrc:    Oral  SpO2: 95% 93% 91% 96%  Weight:      Height:        Intake/Output Summary (Last 24 hours) at 06/18/2019 1705 Last data filed at 06/18/2019 1400 Gross per 24 hour  Intake 188.85 ml  Output 1775 ml  Net -1586.15 ml   Filed Weights   06/14/19 0500 06/15/19 0500 06/18/19 0500  Weight: 62.9 kg 61.9 kg 61.9 kg    Examination: General exam: Conversant, in no acute distress Respiratory system: normal chest rise, clear, no audible wheezing Cardiovascular system: regular rhythm, s1-s2 Gastrointestinal system: Nondistended,  nontender, pos BS Central nervous system: No seizures, no tremors Extremities: No cyanosis, no joint deformities Skin: No rashes, no pallor Psychiatry: Affect normal // no auditory hallucinations   Data Reviewed: I have personally reviewed following labs and imaging studies  CBC: Recent Labs  Lab 06/12/19 0845 06/13/19 0243 06/14/19 0250 06/14/19 1238 06/15/19 0244 06/15/19 1925 06/16/19 0248 06/17/19 0038  WBC 16.9* 22.7* 16.0*  --  10.3  --  13.2* 12.4*  NEUTROABS 11.5*  --  11.6*  --  7.2  --  10.4*  --   HGB 7.8* 8.8* 6.9* 7.8* 7.5* 10.4* 10.4* 10.9*  HCT 26.0* 28.7* 22.5* 24.1* 24.4* 33.1* 33.5* 34.5*  MCV 94.2 93.5 91.5  --  91.7  --  91.5 91.0  PLT 128* 227 246  --  228  --  309 458   Basic Metabolic Panel: Recent Labs  Lab 06/12/19 0845 06/13/19 0243 06/14/19 0250 06/15/19 0244 06/16/19 0248 06/17/19 0038  NA 140 143 145 147* 143 143  K 4.7  3.9 2.8* 3.3* 2.9* 2.7*  CL 98 95* 92* 96* 87* 88*  CO2 30 33* 40* 37* 38* 37*  GLUCOSE 98 169* 122* 87 123* 109*  BUN 39* 32* 20 17 21  26*  CREATININE 2.10* 1.18* 0.78 0.69 0.58 0.70  CALCIUM 8.5* 8.9 9.0 9.4 9.7 9.7  MG 2.2  --  1.5* 1.8 1.7  --   PHOS  --   --   --  3.7 4.0  --    GFR: Estimated Creatinine Clearance: 57.4 mL/min (by C-Bradley formula based on SCr of 0.7 mg/dL). Liver Function Tests: Recent Labs  Lab 06/12/19 0845 06/14/19 0250 06/15/19 0244 06/16/19 0248 06/17/19 0038  AST 34 19  --   --  14*  ALT 26 21  --   --  16  ALKPHOS 163* 131*  --   --  120  BILITOT 0.3 0.3  --   --  1.4*  PROT 5.6* 5.7*  --   --  6.7  ALBUMIN 2.9* 3.5 3.9 4.2 3.9   No results for input(s): LIPASE, AMYLASE in the last 168 hours. Recent Labs  Lab 06/12/19 0851  AMMONIA 25   Coagulation Profile: No results for input(s): INR, PROTIME in the last 168 hours. Cardiac Enzymes: No results for input(s): CKTOTAL, CKMB, CKMBINDEX, TROPONINI in the last 168 hours. BNP (last 3 results) No results for input(s): PROBNP in the  last 8760 hours. HbA1C: No results for input(s): HGBA1C in the last 72 hours. CBG: No results for input(s): GLUCAP in the last 168 hours. Lipid Profile: No results for input(s): CHOL, HDL, LDLCALC, TRIG, CHOLHDL, LDLDIRECT in the last 72 hours. Thyroid Function Tests: No results for input(s): TSH, T4TOTAL, FREET4, T3FREE, THYROIDAB in the last 72 hours. Anemia Panel: No results for input(s): VITAMINB12, FOLATE, FERRITIN, TIBC, IRON, RETICCTPCT in the last 72 hours. Sepsis Labs: Recent Labs  Lab 06/13/19 1718 06/14/19 0250  PROCALCITON <0.10 <0.10    Recent Results (from the past 240 hour(s))  SARS Coronavirus 2 (CEPHEID - Performed in [facility] hospital lab), Hosp Order     Status: None   Collection Time: 06/12/19 10:59 AM   Specimen: Nasopharyngeal Swab  Result Value Ref Range Status   SARS Coronavirus 2 NEGATIVE NEGATIVE Final    Comment: (NOTE) If result is NEGATIVE SARS-CoV-2 target nucleic acids are NOT DETECTED. The SARS-CoV-2 RNA is generally detectable in upper and lower  respiratory specimens during the acute phase of infection. The lowest  concentration of SARS-CoV-2 viral copies this assay can detect is 250  copies / mL. A negative result does not preclude SARS-CoV-2 infection  and should not be used as the sole basis for treatment or other  patient management decisions.  A negative result may occur with  improper specimen collection / handling, submission of specimen other  than nasopharyngeal swab, presence of viral mutation(s) within the  areas targeted by this assay, and inadequate number of viral copies  (<250 copies / mL). A negative result must be combined with clinical  observations, patient history, and epidemiological information. If result is POSITIVE SARS-CoV-2 target nucleic acids are DETECTED. The SARS-CoV-2 RNA is generally detectable in upper and lower  respiratory specimens dur ing the acute phase of infection.  Positive  results are  indicative of active infection with SARS-CoV-2.  Clinical  correlation with patient history and other diagnostic information is  necessary to determine patient infection status.  Positive results do  not rule out bacterial infection or co-infection with other  viruses. If result is PRESUMPTIVE POSTIVE SARS-CoV-2 nucleic acids MAY BE PRESENT.   A presumptive positive result was obtained on the submitted specimen  and confirmed on repeat testing.  While 2019 novel coronavirus  (SARS-CoV-2) nucleic acids may be present in the submitted sample  additional confirmatory testing may be necessary for epidemiological  and / or clinical management purposes  to differentiate between  SARS-CoV-2 and other Sarbecovirus currently known to infect humans.  If clinically indicated additional testing with an alternate test  methodology [phone]) is advised. The SARS-CoV-2 RNA is generally  detectable in upper and lower respiratory sp ecimens during the acute  phase of infection. The expected result is Negative. Fact Sheet for Patients:  StrictlyIdeas.no Fact Sheet for Healthcare Providers: BankingDealers.co.za This test is not yet approved or cleared by the [place] FDA and has been authorized for detection and/or diagnosis of SARS-CoV-2 by FDA under an Emergency Use Authorization (EUA).  This EUA will remain in effect (meaning this test can be used) for the duration of the COVID-19 declaration under Section 564(b)(1) of the Act, 21 U.S.C. section 360bbb-3(b)(1), unless the authorization is terminated or revoked sooner. Performed at [facility], [place] [address], [place], [place] [address]   MRSA PCR Screening     Status: None   Collection Time: 06/12/19  7:30 PM   Specimen: Nasal Mucosa; Nasopharyngeal  Result Value Ref Range Status   MRSA by PCR NEGATIVE NEGATIVE Final    Comment:        The GeneXpert MRSA Assay (FDA approved  for NASAL specimens only), is one component of a comprehensive MRSA colonization surveillance program. It is not intended to diagnose MRSA infection nor to guide or monitor treatment for MRSA infections. Performed at [facility], [place] [address] [place], [place] [address]   Culture, blood (routine x 2)     Status: None   Collection Time: 06/13/19 11:48 AM   Specimen: BLOOD RIGHT WRIST  Result Value Ref Range Status   Specimen Description   Final    BLOOD RIGHT WRIST Performed at [facility], [address], [place], [place] [address]    Special Requests   Final    BOTTLES DRAWN AEROBIC ONLY Blood Culture results may not be optimal due to an inadequate volume of blood received in culture bottles Performed at [place] [address] [place], [place] [address]    Culture   Final    NO GROWTH 5 DAYS Performed at [facility], [place] [address], [place], [place] [address]    Report Status 06/18/2019 FINAL  Final  Culture, blood (routine x 2)     Status: None   Collection Time: 06/13/19 11:49 AM   Specimen: BLOOD RIGHT HAND  Result Value Ref Range Status   Specimen Description   Final    BLOOD RIGHT HAND Performed at [place] [address], [place], [place] [address]    Special Requests   Final    BOTTLES DRAWN AEROBIC ONLY Blood Culture results may not be optimal due to an inadequate volume of blood received in culture bottles Performed at [place] [address], [place], [place] [address]    Culture   Final    NO GROWTH 5 DAYS Performed at [facility], [place] [address] [place], [place] [address]    Report Status 06/18/2019 FINAL  Final     Radiology Studies: No results found.  Scheduled Meds: . atorvastatin  80 mg Oral q1800  . Chlorhexidine Gluconate Cloth  6 each Topical Daily  . cycloSPORINE  1 drop Both Eyes BID  . diltiazem  30 mg Oral Q6H  . DULoxetine   60 mg Oral QHS  . fluticasone furoate-vilanterol  1 puff Inhalation Daily   And  . umeclidinium bromide  1 puff Inhalation Daily  . heparin  5,000 Units Subcutaneous Q8H  . ipratropium-albuterol  3 mL Nebulization TID  . loratadine  10 mg Oral Daily  . magnesium oxide  400 mg Oral BID  . mouth rinse  15 mL Mouth Rinse BID  . methylPREDNISolone (SOLU-MEDROL) injection  40 mg Intravenous Q12H  . metoCLOPramide  5 mg Oral TID AC  . metoprolol tartrate  5 mg Intravenous Once  . metoprolol tartrate  25 mg Oral BID  . nicotine  21 mg Transdermal Q24H  . pantoprazole  40 mg Oral Daily  . QUEtiapine  100 mg Oral QHS  . rOPINIRole  0.5 mg Oral QHS  . torsemide  20 mg Oral BID   Continuous Infusions:    LOS: 6 days   Marylu Lund, MD [facility] Pager On Amion  If 7PM-7AM, please contact night-coverage 06/18/2019, 5:05 PM

## 2019-06-18 NOTE — Consult Note (Signed)
                                                                                 Consultation Note Date: 06/18/2019   Patient Name: Tonya Bradley  DOB: 05/13/1951  MRN: [id number]  Age / Sex: 68 y.o., female  PCP: Jordan, [name], MD Referring Physician: Chiu, Stephen K, MD  Reason for Consultation: Establishing goals of care  HPI/Patient Profile: 68 y.o. female  with past medical history of diastolic heart failure, atrial flutter, CAD, COPD, recent diagnosis of small cell lung cancer, tobacco abuse admitted on 06/12/2019 with heart failure exacerbation, AKI, hypotension, anemia, and thrombocytopenia.  She was noted yesterday to be refusing medications and interventions and palliative consulted for GOC.   Clinical Assessment and Goals of Care: I met today with [name].   She is awake, alert, and oriented.  The first thing she stated was that she wants to go home tomorrow.  I introduced palliative care as specialized medical care for people living with serious illness. It focuses on providing relief from the symptoms and stress of a serious illness. The goal is to improve quality of life for both the patient and the family.  We discussed clinical course as well as wishes moving forward in regard to her care plan and advanced directives.    We discussed that she had been refusing interventions yesterday and she reports that she does not recall doing this.  We discussed difference between a aggressive medical intervention path and a palliative, comfort focused care path.  Values and goals of care important to patient and family were attempted to be elicited.  She reports that she very much wants to get home, but she remains invested in plan to continue with aggressive interventions, continue with chemotherapy, and follow-up with Dr. Mohamed as an outpatient.  SUMMARY OF RECOMMENDATIONS   - DNR/DNI - Discussed goals of care with her at length.  She reports that she does not recall refusing  interventions yesterday but her goal remains to continue with aggressive interventions.  She wants to follow-up with Dr. Mohammed when she is discharged from the hospital and continue with currently planned treatment regimen of chemotherapy. -She reports her husband is concerned about being able to care for her at home.  She would like as much assistance with home health as possible.  She is also listed as a [facility] patient and should qualify for outpatient palliative care through care connections program with [facility] which would also serve as additional resources for her and her husband.  Code Status/Advance Care Planning:  DNR  Psycho-social/Spiritual:   Desire for further Chaplaincy support:no  Additional Recommendations: Caregiving  Support/Resources and Education on Hospice  Prognosis:   Unable to determine  Discharge Planning: Home with Home Health      Primary Diagnoses: Present on Admission: . Acute on chronic diastolic (congestive) heart failure (HCC) . Atrial flutter (HCC) . CAD (coronary artery disease) . Tobacco abuse . Chronic hypoxemic respiratory failure (HCC) . COPD (chronic obstructive pulmonary disease) (HCC) . Small cell lung cancer, right (HCC)   I have reviewed the medical record, interviewed the patient and family, and   examined the patient. The following aspects are pertinent.  Past Medical History:  Diagnosis Date  . Acute on chronic diastolic (congestive) heart failure (HCC) 06/12/2019  . AKI (acute kidney injury) (HCC)   . Anemia   . Anxiety   . Arthritis    "back"  . Atrial flutter (HCC) 03/13/2016  . CAD (coronary artery disease)   . Cancer (HCC)    skin cancer  . Cataract   . Colon polyp   . Complication of anesthesia    BP dropped during esophagus diltation in 2017-admitted for 3 days.   . COPD (chronic obstructive pulmonary disease) (HCC)   . Depression   . Dysrhythmia   . Emphysema of lung (HCC)   . GERD (gastroesophageal  reflux disease)   . Hypercholesterolemia   . Lung cancer (HCC)   . Osteoporosis   . Oxygen deficiency    Social History   Socioeconomic History  . Marital status: Married    Spouse name: Not on file  . Number of children: 3  . Years of education: 10  . Highest education level: Not on file  Occupational History  . Occupation: retired    Comment: customer service [employer]  Social Needs  . Financial resource strain: Not on file  . Food insecurity    Worry: Not on file    Inability: Not on file  . Transportation needs    Medical: Not on file    Non-medical: Not on file  Tobacco Use  . Smoking status: Current Every Day Smoker    Packs/day: 0.25    Years: 54.00    Pack years: 13.50    Types: Cigarettes    Start date: 06/14/1966  . Smokeless tobacco: Never Used  . Tobacco comment: 1-2 packs daily. Chantix too expensive; pt states that she quit 2 weeks ago (02/04/19)   Substance and Sexual Activity  . Alcohol use: No    Alcohol/week: 0.0 standard drinks  . Drug use: No  . Sexual activity: Yes    Birth control/protection: Post-menopausal  Lifestyle  . Physical activity    Days per week: Not on file    Minutes per session: Not on file  . Stress: Not on file  Relationships  . Social connections    Talks on phone: Not on file    Gets together: Not on file    Attends religious service: Not on file    Active member of club or organization: Not on file    Attends meetings of clubs or organizations: Not on file    Relationship status: Not on file  Other Topics Concern  . Not on file  Social History Narrative   Quit high school and got married in 10th grade, age 15   First husband died of colon cancer at young age   Married again 2018   Lives in own home   Daughter lives with her   Drinks coffee seldom, too much "sweet tea"   Family History  Problem Relation Age of Onset  . Breast cancer Sister 59  . Heart disease Sister   . Heart attack Father   . Heart disease Father    . Stroke Mother   . Heart disease Mother   . Hyperlipidemia Mother   . Heart attack Sister   . Heart disease Brother        ?heart failure  . Congestive Heart Failure Brother   . Hypertension Son   . Heart disease Maternal Grandfather   . Cancer   Cousin        lymphoma  . Hypertension Son   . Stomach cancer Other   . Colon cancer Neg Hx   . Pancreatic cancer Neg Hx    Scheduled Meds: . atorvastatin  80 mg Oral q1800  . Chlorhexidine Gluconate Cloth  6 each Topical Daily  . cycloSPORINE  1 drop Both Eyes BID  . diltiazem  30 mg Oral Q6H  . DULoxetine  60 mg Oral QHS  . fluticasone furoate-vilanterol  1 puff Inhalation Daily   And  . umeclidinium bromide  1 puff Inhalation Daily  . heparin  5,000 Units Subcutaneous Q8H  . ipratropium-albuterol  3 mL Nebulization TID  . loratadine  10 mg Oral Daily  . magnesium oxide  400 mg Oral BID  . mouth rinse  15 mL Mouth Rinse BID  . methylPREDNISolone (SOLU-MEDROL) injection  40 mg Intravenous Q12H  . metoCLOPramide  5 mg Oral TID AC  . metoprolol tartrate  5 mg Intravenous Once  . metoprolol tartrate  25 mg Oral BID  . nicotine  21 mg Transdermal Q24H  . pantoprazole  40 mg Oral Daily  . QUEtiapine  100 mg Oral QHS  . rOPINIRole  0.5 mg Oral QHS  . torsemide  20 mg Oral BID   Continuous Infusions: PRN Meds:.acetaminophen **OR** acetaminophen, ALPRAZolam, ipratropium-albuterol, labetalol, ondansetron **OR** ondansetron (ZOFRAN) IV, prochlorperazine, senna-docusate, sodium chloride flush Medications Prior to Admission:  Prior to Admission medications   Medication Sig Start Date End Date Taking? Authorizing Provider  ALPRAZolam Duanne Moron) 0.5 MG tablet Take 1 tablet (0.5 mg total) by mouth 2 (two) times daily. 04/19/19  Yes Martinique, [name], MD  amiodarone (PACERONE) 200 MG tablet TAKE 1/2 TABLET BY MOUTH ONCE DAILY Patient taking differently: Take 100 mg by mouth daily.  03/27/19  Yes Evans Lance, MD  atorvastatin (LIPITOR) 80 MG  tablet TAKE 1 TABLET(80 MG) BY MOUTH DAILY Patient taking differently: Take 80 mg by mouth daily at 6 PM.  04/28/19  Yes Martinique, [name], MD  Cholecalciferol (VITAMIN D) 2000 units CAPS Take 2,000 Units by mouth daily.   Yes [provider]  cycloSPORINE (RESTASIS) 0.05 % ophthalmic emulsion Place 1 drop into both eyes 2 (two) times daily.   Yes [provider]  dexamethasone (DECADRON) 4 MG tablet TAKE 1 TABLET(4 MG) BY MOUTH TWICE DAILY FOR 5 DAYS AFTER CHEMOTHERAPY Patient taking differently: Take 4 mg by mouth 2 (two) times daily. FOR 5 DAYS AFTER CHEMOTHERAPY 05/10/19  Yes Curt Bears, MD  Dexlansoprazole (DEXILANT) 30 MG capsule Take 1 capsule (30 mg total) by mouth daily. 02/19/19  Yes Martinique, [name], MD  diltiazem (CARDIZEM CD) 120 MG 24 hr capsule TAKE 1 CAPSULE(120 MG) BY MOUTH DAILY Patient taking differently: Take 120 mg by mouth daily.  01/08/19  Yes Martinique, [name], MD  DULoxetine (CYMBALTA) 60 MG capsule Take 1 capsule (60 mg total) by mouth daily. Patient taking differently: Take 60 mg by mouth at bedtime.  10/06/18  Yes Martinique, [name], MD  furosemide (LASIX) 40 MG tablet Take 1.5 tablets (60 mg total) by mouth 2 (two) times daily. 05/27/19  Yes Evans Lance, MD  LINZESS 290 MCG CAPS capsule TAKE 1 CAPSULE(290 MCG) BY MOUTH DAILY AS NEEDED FOR CONSTIPATION Patient taking differently: Take 290 mcg by mouth daily as needed (constipation).  06/11/19  Yes Martinique, [name], MD  loratadine (CLARITIN) 10 MG tablet Take 10 mg by mouth daily.   Yes  [provider]  magnesium oxide (MAG-OX) 400 (241.3 Mg) MG tablet 1 tablet twice daily Patient taking differently: Take 400 mg by mouth 2 (two) times daily.  04/19/19  Yes Curt Bears, MD  metoCLOPramide (REGLAN) 5 MG tablet Take 1 tablet (5 mg total) by mouth 3 (three) times daily before meals. 05/11/19  Yes Melrose Nakayama, MD  metoprolol tartrate (LOPRESSOR) 25 MG tablet Take 1 tablet (25 mg total) by mouth 2  (two) times daily. 02/18/19  Yes Barrett, Erin R, PA-C  Multiple Vitamin (MULTIVITAMIN WITH MINERALS) TABS tablet Take 1 tablet by mouth daily.   Yes [provider]  ondansetron (ZOFRAN) 4 MG tablet TAKE 1 TABLET(4 MG) BY MOUTH TWICE DAILY AS NEEDED FOR NAUSEA OR VOMITING Patient taking differently: Take 4 mg by mouth 2 (two) times daily as needed for nausea or vomiting.  05/11/19  Yes Martinique, [name], MD  potassium chloride (K-DUR) 10 MEQ tablet Take 1 tablet (10 mEq total) by mouth 2 (two) times daily. 05/27/19  Yes Evans Lance, MD  prochlorperazine (COMPAZINE) 10 MG tablet TAKE 1 TABLET(10 MG) BY MOUTH EVERY 6 HOURS AS NEEDED FOR NAUSEA OR VOMITING Patient taking differently: Take 10 mg by mouth every 6 (six) hours as needed for nausea or vomiting.  05/10/19  Yes Curt Bears, MD  QUEtiapine (SEROQUEL) 100 MG tablet TAKE 1 TABLET(100 MG) BY MOUTH AT BEDTIME Patient taking differently: Take 100 mg by mouth at bedtime.  05/18/19  Yes Martinique, [name], MD  rOPINIRole (REQUIP) 0.25 MG tablet TAKE 2 TABLETS(0.5 MG) BY MOUTH AT BEDTIME Patient taking differently: Take 0.5 mg by mouth at bedtime.  03/15/19  Yes Martinique, [name], MD  TRELEGY ELLIPTA 100-62.5-25 MCG/INH AEPB INHALE 1 PUFF INTO THE LUNGS DAILY Patient taking differently: Inhale 1 puff into the lungs daily.  12/29/18  Yes Byrum, Rose Fillers, MD  VENTOLIN HFA 108 (90 Base) MCG/ACT inhaler INHALE 2 PUFFS INTO THE LUNGS EVERY 6 HOURS AS NEEDED FOR WHEEZING OR SHORTNESS OF BREATH Patient taking differently: Inhale 2 puffs into the lungs every 6 (six) hours as needed (wheezing/shortness of breath).  09/19/17  Yes Martinique, [name], MD  guaiFENesin (MUCINEX) 600 MG 12 hr tablet Take 2 tablets (1,200 mg total) by mouth 2 (two) times daily. For 7 days, then use as needed Patient not taking: Reported on 06/12/2019 02/18/19   Barrett, Lodema Hong, PA-C  nitroGLYCERIN (NITROSTAT) 0.4 MG SL tablet Place 1 tablet (0.4 mg total) under the tongue every 5  (five) minutes as needed for chest pain. Reported on 06/12/2016 Patient not taking: Reported on 05/26/2019 10/22/18   Martinique, [name], MD   Allergies  Allergen Reactions  . Morphine And Related Other (See Comments)    hallucinations   Review of Systems  Constitutional: Positive for activity change, appetite change and fatigue.  Gastrointestinal: Positive for nausea.  Neurological: Positive for weakness.  Psychiatric/Behavioral: Positive for sleep disturbance.    Physical Exam General: Alert, awake, in no acute distress.  HEENT: No bruits, no goiter, no JVD Heart: Regular rate and rhythm. No murmur appreciated. Lungs: Good air movement, clear Abdomen: Soft, nontender, nondistended, positive bowel sounds.  Ext: No significant edema Skin: Warm and dry Neuro: Grossly intact, nonfocal.  Vital Signs: BP (!) 127/59   Pulse 70   Temp 97.8 F (36.6 C) (Oral)   Resp (!) 22   Ht 5' 2" (1.575 m)   Wt 61.9 kg   SpO2 96%   BMI 24.96  kg/m  Pain Scale: 0-10   Pain Score: 0-No pain   SpO2: SpO2: 96 % O2 Device:SpO2: 96 % O2 Flow Rate: .O2 Flow Rate (L/min): 2 L/min  IO: Intake/output summary:   Intake/Output Summary (Last 24 hours) at 06/18/2019 1835 Last data filed at 06/18/2019 1700 Gross per 24 hour  Intake 188.85 ml  Output 1875 ml  Net -1686.15 ml    LBM: Last BM Date: 06/18/19 Baseline Weight: Weight: 60.8 kg Most recent weight: Weight: 61.9 kg     Palliative Assessment/Data:   Flowsheet Rows     Most Recent Value  Intake Tab  Referral Department  Hospitalist  Unit at Time of Referral  ICU  Palliative Care Primary Diagnosis  Cancer  Date Notified  06/17/19  Palliative Care Type  New Palliative care  Reason for referral  Clarify Goals of Care  Date of Admission  06/12/19  Date first seen by Palliative Care  06/18/19  # of days Palliative referral response time  1 Day(s)  # of days IP prior to Palliative referral  5  Clinical Assessment  Palliative  Performance Scale Score  60%  Psychosocial & Spiritual Assessment  Palliative Care Outcomes  Patient/Family meeting held?  Yes  Who was at the meeting?  Patient  Palliative Care Outcomes  Clarified goals of care      Time In: 1740 Time Out: 1900 Time Total: 80 Greater than 50%  of this time was spent counseling and coordinating care related to the above assessment and plan.  Signed by: Micheline Rough, MD   Please contact Palliative Medicine Team phone at [phone] for questions and concerns.  For individual provider: See Shea Evans

## 2019-06-19 LAB — MAGNESIUM: Magnesium: 1.8 mg/dL (ref 1.7–2.4)

## 2019-06-19 LAB — BASIC METABOLIC PANEL
Anion gap: 13 (ref 5–15)
BUN: 35 mg/dL — ABNORMAL HIGH (ref 8–23)
CO2: 39 mmol/L — ABNORMAL HIGH (ref 22–32)
Calcium: 9.5 mg/dL (ref 8.9–10.3)
Chloride: 88 mmol/L — ABNORMAL LOW (ref 98–111)
Creatinine, Ser: 0.77 mg/dL (ref 0.44–1.00)
GFR calc Af Amer: >60 mL/min (ref >60)
GFR calc non Af Amer: >60 mL/min (ref >60)
Glucose, Bld: 162 mg/dL — ABNORMAL HIGH (ref 70–99)
Potassium: 2.7 mmol/L — CL (ref 3.5–5.1)
Sodium: 140 mmol/L (ref 135–145)

## 2019-06-19 LAB — POTASSIUM: Potassium: 3.9 mmol/L (ref 3.5–5.1)

## 2019-06-19 MED ORDER — IPRATROPIUM-ALBUTEROL 0.5-2.5 (3) MG/3ML IN SOLN: 3.0000 mL | RESPIRATORY_TRACT | 0 refills | Status: AC | PRN

## 2019-06-19 MED ORDER — IPRATROPIUM-ALBUTEROL 0.5-2.5 (3) MG/3ML IN SOLN
3.0000 mL | Freq: Two times a day (BID) | RESPIRATORY_TRACT | Status: DC
  Filled 2019-06-19: qty 3

## 2019-06-19 MED ORDER — POTASSIUM CHLORIDE CRYS ER 20 MEQ PO TBCR
40.0000 meq | EXTENDED_RELEASE_TABLET | Freq: Two times a day (BID) | ORAL | Status: DC
  Administered 2019-06-19: 40 meq via ORAL
  Filled 2019-06-19: qty 2

## 2019-06-19 MED ORDER — TORSEMIDE 20 MG PO TABS: 20.0000 mg | ORAL_TABLET | Freq: Two times a day (BID) | ORAL | 0 refills | Status: DC

## 2019-06-19 MED ORDER — PREDNISONE 10 MG PO TABS: ORAL_TABLET | ORAL | 0 refills | Status: DC

## 2019-06-19 MED ORDER — POTASSIUM CHLORIDE CRYS ER 20 MEQ PO TBCR
60.0000 meq | EXTENDED_RELEASE_TABLET | Freq: Once | ORAL | Status: AC
  Administered 2019-06-19: 60 meq via ORAL
  Filled 2019-06-19: qty 3

## 2019-06-19 NOTE — Care Management Important Message (Signed)
Important Message  Patient Details  Name: Tonya Bradley MRN: [id number] Date of Birth: 1951/01/04   Medicare Important Message Given:  Yes     Joaquin Courts, RN 06/19/2019, 12:46 PM

## 2019-06-19 NOTE — Progress Notes (Addendum)
RN spoke with CM regarding pt's imminent discharge. Plans were arranged for [facility] to bring oxygen supplies to the hospital for transport home. Per CM, [facility] to call RN when in route. RN did not receive call from Abie. Patient was who notified RN of delivery at approx 1845. Per patient, her family had been making phone calls to [facility] regarding delivery. Staff went to main entrance and oxygen setup had been left at the front desk of [facility].  Discharge instructions provided to patient. All questions answered. IV removed, pt assisted with dressing. Pt taken down via wheelchair by NT at 1950 and placed on new home oxygen setup without complication.

## 2019-06-19 NOTE — Progress Notes (Signed)
Daily Progress Note   Patient Name: Tonya Bradley       Date: 06/19/2019 DOB: 13-Apr-1951  Age: 68 y.o. MRN#: [id number] Attending Physician: Donne Hazel, MD Primary Care Physician: Martinique, [name], MD Admit Date: 06/12/2019  Reason for Consultation/Follow-up: Establishing goals of care  Subjective: Discussed with Dr. Wyline Copas and met with [name].    She is very anxious to get home and adamant about discharging today.  She reports feeling better, but still with higher than baseline O2 requirements.  I recommended consideration for enrolling in [facility] as she is [facility] patient and has stated she wants "as much help and support as I can get at home to help me and my husband deal with this."  She was agreeable and I called to discuss with liaison.  Discussed increased O2 requirement and plan for steroids and possibly trial of oximyzer.  We also discussed need to see how she continues to respond to therapies and how her breathing and O2 needs change to see if Dr. Julien Nordmann is still able to offer further chemotherapy.  Length of Stay: 7  Current Medications: Scheduled Meds:  . atorvastatin  80 mg Oral q1800  . Chlorhexidine Gluconate Cloth  6 each Topical Daily  . cycloSPORINE  1 drop Both Eyes BID  . diltiazem  30 mg Oral Q6H  . DULoxetine  60 mg Oral QHS  . fluticasone furoate-vilanterol  1 puff Inhalation Daily   And  . umeclidinium bromide  1 puff Inhalation Daily  . heparin  5,000 Units Subcutaneous Q8H  . ipratropium-albuterol  3 mL Nebulization TID  . loratadine  10 mg Oral Daily  . magnesium oxide  400 mg Oral BID  . mouth rinse  15 mL Mouth Rinse BID  . methylPREDNISolone (SOLU-MEDROL) injection  40 mg Intravenous Q12H  . metoCLOPramide  5 mg Oral TID AC  . metoprolol  tartrate  25 mg Oral BID  . nicotine  21 mg Transdermal Q24H  . pantoprazole  40 mg Oral Daily  . potassium chloride  60 mEq Oral Once  . QUEtiapine  100 mg Oral QHS  . rOPINIRole  0.5 mg Oral QHS  . torsemide  20 mg Oral BID    Continuous Infusions:   PRN Meds: acetaminophen **OR** acetaminophen, ALPRAZolam, ipratropium-albuterol, labetalol, ondansetron **OR**  ondansetron (ZOFRAN) IV, prochlorperazine, senna-docusate, sodium chloride flush  Physical Exam         General: Alert, awake, in no acute distress.  Heart: Regular rate and rhythm. No murmur appreciated. Lungs: Decreased air movement Abdomen: Soft, nontender, nondistended, positive bowel sounds.  Ext: No significant edema Skin: Warm and dry Neuro: Grossly intact, nonfocal.   Vital Signs: BP (!) 105/55   Pulse 79   Temp 98.2 F (36.8 C) (Oral)   Resp (!) 23   Ht 5' 2"  (1.575 m)   Wt 57 kg   SpO2 90%   BMI 22.98 kg/m  SpO2: SpO2: 90 % O2 Device: O2 Device: Nasal Cannula O2 Flow Rate: O2 Flow Rate (L/min): 5 L/min  Intake/output summary:   Intake/Output Summary (Last 24 hours) at 06/19/2019 1016 Last data filed at 06/18/2019 2000 Gross per 24 hour  Intake 22.22 ml  Output 1100 ml  Net -1077.78 ml   LBM: Last BM Date: 06/18/19 Baseline Weight: Weight: 60.8 kg Most recent weight: Weight: 57 kg       Palliative Assessment/Data:    Flowsheet Rows     Most Recent Value  Intake Tab  Referral Department  Hospitalist  Unit at Time of Referral  ICU  Palliative Care Primary Diagnosis  Cancer  Date Notified  06/17/19  Palliative Care Type  New Palliative care  Reason for referral  Clarify Goals of Care  Date of Admission  06/12/19  Date first seen by Palliative Care  06/18/19  # of days Palliative referral response time  1 Day(s)  # of days IP prior to Palliative referral  5  Clinical Assessment  Palliative Performance Scale Score  60%  Psychosocial & Spiritual Assessment  Palliative Care Outcomes   Patient/Family meeting held?  Yes  Who was at the meeting?  Patient  Palliative Care Outcomes  Clarified goals of care      Patient Active Problem List   Diagnosis Date Noted  . SVT (supraventricular tachycardia) ([place]) 06/17/2019  . Acute respiratory failure with hypoxemia ([place])   . Shock ([place])   . AKI (acute kidney injury) ([place])   . Acute on chronic diastolic (congestive) heart failure ([place]) 06/12/2019  . Chemotherapy induced nausea and vomiting 05/26/2019  . Goals of care, counseling/discussion 03/25/2019  . Encounter for antineoplastic chemotherapy 03/25/2019  . Small cell lung cancer, right ([place]) 02/18/2019  . S/P Right Upper Lobectomy, Right Middle Lobectomy 02/04/2019  . Colitis 01/01/2019  . Dehydration 01/01/2019  . Hypokalemia 01/01/2019  . Dysphonia 06/19/2018  . Bilateral lower extremity edema 02/16/2018  . Lumbar stenosis 01/19/2018  . GERD (gastroesophageal reflux disease) 11/18/2017  . 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 COPD (chronic obstructive pulmonary disease) ([place]) 09/09/2017  . Varicose veins of bilateral lower extremities with other complications 38/18/2993  . Chronic pain disorder 05/26/2017  . Back pain 05/26/2017  . Insomnia 05/26/2017  . Osteopenia 11/26/2016  . Other emphysema ([place]) 11/07/2016  . Chronic anxiety 11/07/2016  . Chronic hypoxemic respiratory failure ([place]) 11/07/2016  . Constipation 06/12/2016  . Right carotid bruit 04/03/2016  . CAD (coronary artery disease) 04/03/2016  . Tobacco abuse 04/03/2016  . Atrial flutter ([place]) 03/13/2016  . Hiatal hernia   . Reflux esophagitis   . Schatzki's ring   . Dysphagia 03/01/2016  . Solitary pulmonary nodule 03/01/2016  . 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 Microscopic colitis 08/15/2014  . Loose stools 06/08/2014  . BCC (basal cell carcinoma), face 09/13/2012  Palliative Care Assessment & Plan   Patient Profile: 68 y.o. female  with past  medical history of diastolic heart failure, atrial flutter, CAD, COPD, recent diagnosis of small cell lung cancer, tobacco abuse admitted on 06/12/2019 with heart failure exacerbation, AKI, hypotension, anemia, and thrombocytopenia.  She was noted yesterday to be refusing medications and interventions and palliative consulted for [place].   Assessment: Patient Active Problem List   Diagnosis Date Noted  . SVT (supraventricular tachycardia) ([place]) 06/17/2019  . Acute respiratory failure with hypoxemia ([place])   . Shock ([place])   . AKI (acute kidney injury) ([place])   . Acute on chronic diastolic (congestive) heart failure ([place]) 06/12/2019  . Chemotherapy induced nausea and vomiting 05/26/2019  . Goals of care, counseling/discussion 03/25/2019  . Encounter for antineoplastic chemotherapy 03/25/2019  . Small cell lung cancer, right ([place]) 02/18/2019  . S/P Right Upper Lobectomy, Right Middle Lobectomy 02/04/2019  . Colitis 01/01/2019  . Dehydration 01/01/2019  . Hypokalemia 01/01/2019  . Dysphonia 06/19/2018  . Bilateral lower extremity edema 02/16/2018  . Lumbar stenosis 01/19/2018  . GERD (gastroesophageal reflux disease) 11/18/2017  . 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 COPD (chronic obstructive pulmonary disease) ([place]) 09/09/2017  . Varicose veins of bilateral lower extremities with other complications 25/95/6387  . Chronic pain disorder 05/26/2017  . Back pain 05/26/2017  . Insomnia 05/26/2017  . Osteopenia 11/26/2016  . Other emphysema ([place]) 11/07/2016  . Chronic anxiety 11/07/2016  . Chronic hypoxemic respiratory failure ([place]) 11/07/2016  . Constipation 06/12/2016  . Right carotid bruit 04/03/2016  . CAD (coronary artery disease) 04/03/2016  . Tobacco abuse 04/03/2016  . Atrial flutter ([place]) 03/13/2016  . Hiatal hernia   . Reflux esophagitis   . Schatzki's ring   . Dysphagia 03/01/2016  . Solitary pulmonary nodule 03/01/2016  . Anxiolytic  dependence ([place]) 02/21/2016  . Microscopic colitis 08/15/2014  . Loose stools 06/08/2014  . BCC (basal cell carcinoma), face 09/13/2012    Recommendations/Plan: - DNR/DNI - She is adamant about going home today.  Discussed with Dr. Wyline Copas.  Plan to see if she may benefit from oximyzer and possible d/c this afternoon. -She reports her husband is concerned about being able to care for her at home.  She would like as much assistance at home as possible.  Called and discussed with [facility] liaison who will verify if she qualifies as she is listed as a [place] patient.   Code Status:    Code Status Orders  (From admission, onward)         Start     Ordered   06/12/19 1554  Do not attempt resuscitation (DNR)  Continuous    Question Answer Comment  In the event of cardiac or respiratory ARREST Do not call a "code blue"   In the event of cardiac or respiratory ARREST Do not perform Intubation, CPR, defibrillation or ACLS   In the event of cardiac or respiratory ARREST Use medication by any route, position, wound care, and other measures to relive pain and suffering. May use oxygen, suction and manual treatment of airway obstruction as needed for comfort.      06/12/19 1553        Code Status History    Date Active Date Inactive Code Status Order ID Comments User Context   06/12/2019 1119 06/12/2019 1554 DNR [id number]  Carmin Muskrat, MD ED   02/04/2019 1556 02/18/2019 1528 Full Code [id number]  Antony Odea, PA-C Inpatient  01/02/2019 0303 01/03/2019 2346 DNR [id number]  Toy Baker, MD ED   01/19/2018 1557 01/20/2018 1537 Full Code [id number]  Lanae Crumbly, PA-C Inpatient   05/19/2017 1003 05/19/2017 1925 Full Code [id number]  Evans Lance, MD Inpatient   03/13/2016 1748 03/15/2016 1743 Full Code [id number]  Samuella Cota, MD Inpatient   Advance Care Planning Activity    Advance Directive Documentation     Most Recent Value  Type of Advance Directive  Living will   Pre-existing out of facility DNR order (yellow form or pink MOST form)  -  "MOST" Form in Place?  -       Prognosis:   Unable to determine  Discharge Planning:  Home with recommendation for palliative care f/u as OP  Care plan was discussed with patient, Dr. Wyline Copas  Thank you for allowing the Palliative Medicine Team to assist in the care of this patient.   Time In: 0920 Time Out: 1000 Total Time 40  Prolonged Time Billed No      Greater than 50%  of this time was spent counseling and coordinating care related to the above assessment and plan.  Micheline Rough, MD  Please contact Palliative Medicine Team phone at [phone] for questions and concerns.

## 2019-06-19 NOTE — Progress Notes (Signed)
PT Cancellation/Screen Note  Patient Details Name: Tonya Bradley MRN: [id number] DOB: 05/23/1951   Cancelled Treatment:    Reason Eval/Treat Not Completed: PT screened, no needs identified, will sign off. Spoke with pt who stated she was set to go home. Spoke with RN who confirmed this. PT eval no longer needed per RN. Will sign off.   Weston Anna, PT Acute Rehabilitation Services Pager: [phone] Office: [phone]

## 2019-06-19 NOTE — Progress Notes (Signed)
SATURATION QUALIFICATIONS: (This note is used to comply with regulatory documentation for home oxygen)  Patient Saturations on Room Air at Rest = 82%  Patient Saturations on Room Air while Ambulating = 69%  Patient Saturations on 5 Liters of oxygen while Ambulating = 88%  Please briefly explain why patient needs home oxygen: COPD, Lung CA, increasing oxygen requirements

## 2019-06-19 NOTE — TOC Progression Note (Signed)
Transition of Care [facility]) - Progression Note    Patient Details  Name: Tonya Bradley MRN: [id number] Date of Birth: 1951/03/26  Transition of Care [facility]) CM/SW Contact  Joaquin Courts, RN Phone Number: 06/19/2019, 4:11 PM  Clinical Narrative:    [place] PT OT RN Aide and social work arranged with [facility], rep Dorian Pod made aware. Patient has nebulizer machine at home.      Expected Discharge Plan: Home/Self Care Barriers to Discharge: No Barriers Identified  Expected Discharge Plan and Services Expected Discharge Plan: Home/Self Care   Discharge Planning Services: CM Consult   Living arrangements for the past 2 months: Single Family Home Expected Discharge Date: 06/19/19               DME Arranged: Oxygen DME Agency: [employer] Date DME Agency Contacted: 06/19/19 Time DME Agency Contacted: [id number] Representative spoke with at DME Agency: on call answering service HH Arranged: NA [place] Agency: NA         Social Determinants of Health (SDOH) Interventions    Readmission Risk Interventions Readmission Risk Prevention Plan 06/14/2019 02/09/2019  Transportation Screening Complete Complete  HRI or [place] - Complete  Palliative Care Screening - Not Applicable  Medication Review Press photographer) Not Complete -  Med Review Comments some medications reported PTA need review per RN admission summary -  PCP or Specialist appointment within 3-5 days of discharge Not Complete -  PCP/Specialist Appt Not Complete comments Pt established with PCP and cardiologist however DC date uknown -  HRI or Home Care Consult Complete -  SW Recovery Care/Counseling Consult Complete -  Palliative Care Screening Not Applicable -  [place] Not Applicable -  Some recent data might be hidden

## 2019-06-19 NOTE — TOC Initial Note (Signed)
Transition of Care [facility]) - Initial/Assessment Note    Patient Details  Name: Tonya Bradley MRN: [id number] Date of Birth: [birth year]  Transition of Care (TOC) CM/SW Contact:    Joaquin Courts, RN Phone Number: 06/19/2019, 1:52 PM  Clinical Narrative:     CM spoke with patient who reports she uses [facility] for her oxygen needs. CM called [employer] on call answering service and arranged for portable O2 tank and high flow nasal cannula to be delivered to bedside for d/c home.                Expected Discharge Plan: Home/Self Care Barriers to Discharge: No Barriers Identified   Patient Goals and CMS Choice Patient states their goals for this hospitalization and ongoing recovery are:: to go home      Expected Discharge Plan and Services Expected Discharge Plan: Home/Self Care   Discharge Planning Services: CM Consult   Living arrangements for the past 2 months: Single Family Home                 DME Arranged: Oxygen DME Agency: [facility] Date DME Agency Contacted: 06/19/19 Time DME Agency Contacted: [phone] Representative spoke with at DME Agency: on call answering service [place] Arranged: NA [place] Agency: NA        Prior Living Arrangements/Services Living arrangements for the past 2 months: [place] Lives with:: Spouse Patient language and need for interpreter reviewed:: Yes Do you feel safe going back to the place where you live?: Yes      Need for Family Participation in Patient Care: Yes (Comment) Care giver support system in place?: Yes (comment)   Criminal Activity/Legal Involvement Pertinent to Current Situation/Hospitalization: No - Comment as needed  Activities of Daily Living Home Assistive Devices/Equipment: None ADL Screening (condition at time of admission) Patient's cognitive ability adequate to safely complete daily activities?: Yes Is the patient deaf or have difficulty hearing?: No Does the patient have difficulty seeing, even when wearing  glasses/contacts?: No Does the patient have difficulty concentrating, remembering, or making decisions?: No Patient able to express need for assistance with ADLs?: Yes Does the patient have difficulty dressing or bathing?: Yes Independently performs ADLs?: No Communication: Independent Dressing (OT): Needs assistance Is this a change from baseline?: Pre-admission baseline Grooming: Independent Feeding: Independent Bathing: Needs assistance Is this a change from baseline?: Pre-admission baseline Toileting: Needs assistance Is this a change from baseline?: Pre-admission baseline In/Out Bed: Needs assistance Is this a change from baseline?: Pre-admission baseline Walks in Home: Independent Does the patient have difficulty walking or climbing stairs?: No Weakness of Legs: None Weakness of Arms/Hands: None  Permission Sought/Granted                  Emotional Assessment Appearance:: Appears stated age Attitude/Demeanor/Rapport: Engaged Affect (typically observed): Accepting Orientation: : Oriented to Place, Oriented to  Time, Oriented to Situation, Oriented to Self   Psych Involvement: No (comment)  Admission diagnosis:  Weakness [R53.1] Acute on chronic combined systolic and diastolic congestive heart failure (HCC) [I50.43] AKI (acute kidney injury) ([place]) [N17.9] Patient Active Problem List   Diagnosis Date Noted  . SVT (supraventricular tachycardia) ([place]) 06/17/2019  . Acute respiratory failure with hypoxemia ([place])   . Shock ([place])   . AKI (acute kidney injury) ([place])   . Acute on chronic diastolic (congestive) heart failure ([place]) 06/12/2019  . Chemotherapy induced nausea and vomiting 05/26/2019  . Goals of care, counseling/discussion 03/25/2019  . Encounter for antineoplastic chemotherapy 03/25/2019  .  Small cell lung cancer, right ([place]) 02/18/2019  . S/P Right Upper Lobectomy, Right Middle Lobectomy 02/04/2019  . Colitis 01/01/2019  . Dehydration 01/01/2019  .  Hypokalemia 01/01/2019  . Dysphonia 06/19/2018  . Bilateral lower extremity edema 02/16/2018  . Lumbar stenosis 01/19/2018  . GERD (gastroesophageal reflux disease) 11/18/2017  . 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 COPD (chronic obstructive pulmonary disease) ([place]) 09/09/2017  . Varicose veins of bilateral lower extremities with other complications 12/87/8676  . Chronic pain disorder 05/26/2017  . Back pain 05/26/2017  . Insomnia 05/26/2017  . Osteopenia 11/26/2016  . Other emphysema ([place]) 11/07/2016  . Chronic anxiety 11/07/2016  . Chronic hypoxemic respiratory failure ([place]) 11/07/2016  . Constipation 06/12/2016  . Right carotid bruit 04/03/2016  . CAD (coronary artery disease) 04/03/2016  . Tobacco abuse 04/03/2016  . Atrial flutter ([place]) 03/13/2016  . Hiatal hernia   . Reflux esophagitis   . Schatzki's ring   . Dysphagia 03/01/2016  . Solitary pulmonary nodule 03/01/2016  . 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 Microscopic colitis 08/15/2014  . Loose stools 06/08/2014  . BCC (basal cell carcinoma), face 09/13/2012   PCP:  [place], [name], MD Pharmacy:   [facility], [place] - [address] [place] [address] Phone: [phone] Fax: [phone]  [facility] [address], [address] [place] [address] Phone: [phone] Fax: [phone]     Social Determinants of Health (SDOH) Interventions    Readmission Risk Interventions Readmission Risk Prevention Plan 06/14/2019 02/09/2019  Transportation Screening Complete Complete  HRI or Home Care Consult - Complete  Palliative Care Screening - Not Applicable  Medication Review (RN Care Manager) Not Complete -  Med Review Comments some medications reported PTA need review per RN admission summary -  PCP or Specialist appointment within 3-5  days of discharge Not Complete -  PCP/Specialist Appt Not Complete comments Pt established with PCP and cardiologist however DC date uknown -  HRI or Home Care Consult Complete -  SW Recovery Care/Counseling Consult Complete -  Palliative Care Screening Not Applicable -  [place] Not Applicable -  Some recent data might be hidden

## 2019-06-19 NOTE — Discharge Summary (Signed)
Physician Discharge Summary  Tonya Bradley DXA:[id number] DOB: 08-Mar-1951 DOA: 06/12/2019  PCP: Martinique, [name], MD  Admit date: 06/12/2019 Discharge date: 06/19/2019  Admitted From: Home Disposition:  Home  Recommendations for Outpatient Follow-up:  1. Follow up with PCP in 1-2 weeks 2. Follow up with Cardiology as scheduled 3. Follow up with Oncology 4. Please repeat BMET in one week, PCP to follow  Home Health:PT, OT, RN, Aide, SW  Equipment/Devices:nebulizer    Discharge Condition:Stable CODE STATUS:DNR Diet recommendation: Heart healthy   Brief/Interim Summary: 68 y.o.femalewith medical history significant fordiastolic heart failure, atrial flutter, CAD, COPD oxygen dependent, small cell lung cancer, tobacco abuse, presented to the ED complaining of lethargy which is been ongoing past couple of days,bilateral lower extremity edema which has been ongoing for weeks. Of note, patient is currently undergoing chemotherapy with Dr. Earlie Server.Patient has chronic dyspnea, noted to be requiring more oxygen. Patient denies any chest pain, cough, nausea/vomiting, abdominal pain, diarrhea,fever/chills.  Discharge Diagnoses:  Principal Problem:   Acute on chronic diastolic (congestive) heart failure (HCC) Active Problems:   Atrial flutter (HCC)   CAD (coronary artery disease)   Tobacco abuse   Chronic hypoxemic respiratory failure (HCC)   COPD (chronic obstructive pulmonary disease) (HCC)   Small cell lung cancer, right ([place])   Shock ([place])   AKI (acute kidney injury) ([place])   Acute respiratory failure with hypoxemia (HCC)   SVT (supraventricular tachycardia) ([place])  Acute on chronic hypoxic respiratory failure - Patient noted to desaturate to as low 70s 80s on 2 L (uses 2L chronically) - at admission she clinically looked overloaded - BNP noted to be 2597, troponin negative, EKG with no acute ST changes - Chest x-ray showed no consolidation or edema - Echo  done on 03/2016 showed EF of 55 to 60%, repeat echo: normal systolic function, with an ejection fraction of 60-65%; right ventricle has normal systolic function; Right ventricular systolic pressure could not be assessed; There is no evidence of pericardial effusion. - CTA chest ordered, dopplers negative - cardiology was consulted and pt was diuresed. Cardiology since signed off - required HFNC ON, remains on [facility] currently despite aggressive diuresis - CTA PE: no PE; post surgical changes noted; atelectasis - Difficult with weaning. Discussed with Palliative Care. Plan for arranging home high flow O2. Will also arrange home health  AKI - Renal vascular congestion/cardiorenal/??unlikely intravascular volume depletion - Daily BMP - AKI resolved  Hypotension - Monitor closely in SDU - Recently required pressor support, now off pressors as fo 7/13 - cortisol levels noted to be unremarkable    - BP stable  Chronic leukocytosis - Ongoing, around baseline - Stable, afebrile  Anemia of chronic disease from malignancy - Likely from chemotherapy, denies any GI bleeding - FOBT pending - recently received 1 unit of PRBC for Hgb 6.9      - CBC reviewed, hgb has remained stable thus far  Thrombocytopenia - Resolved     - No evidence of bleeding      Atrial flutter - Now rate controlled - Not on any anticoagulation - Cardiology had been following      - Pt had been refusing meds and PO, now agreeable     - now off cardizem gtt and tolerating PO cardizem with beta blocker     - resume meds on d/c  COPD - Continue supplemental oxygen, inhalers     - still require significant O2  Small cell lung cancer - Currently on chemotherapy - Followed  by Dr. Earlie Server     - Outpt records reviewed. Pt did not tolerate chemo well with complaints of mouth pain, nausea, and abd pain  GERD - Cont  with PPI  Tobacco abuse - Advised to quit - Continued on nicotine patch  Depression/anxiety - On quetiapine, Cymbalta as tolerated  Sepsis - unknown cause - Bld Cx ordered - CTA PE reviewed with findings of some ground glass opacity - Currently afebrile at this time - Empiric abx stopped; pressors off;  Acute on chronic diastolic HF - BMP 0626 - cardiology had been on board, appreciate assistance -cont on PO torsemide per Cardiology  End of Life     - Pt recently refused meds and PO with poor prognosis    - This AM, pt more awake and interactive, taking medicaitons as directed    - Given gradual decline prior to admit and known small cell lung CA, would still benefit from Palliative Care consultation   Discharge Instructions   Allergies as of 06/19/2019      Reactions   Morphine And Related Other (See Comments)   hallucinations      Medication List    STOP taking these medications   amiodarone 200 MG tablet Commonly known as: PACERONE   dexamethasone 4 MG tablet Commonly known as: DECADRON   furosemide 40 MG tablet Commonly known as: Lasix   guaiFENesin 600 MG 12 hr tablet Commonly known as: MUCINEX   Linzess 290 MCG Caps capsule Generic drug: linaclotide   nitroGLYCERIN 0.4 MG SL tablet Commonly known as: NITROSTAT     TAKE these medications   ALPRAZolam 0.5 MG tablet Commonly known as: XANAX Take 1 tablet (0.5 mg total) by mouth 2 (two) times daily.   atorvastatin 80 MG tablet Commonly known as: LIPITOR TAKE 1 TABLET(80 MG) BY MOUTH DAILY What changed: See the new instructions.   cycloSPORINE 0.05 % ophthalmic emulsion Commonly known as: RESTASIS Place 1 drop into both eyes 2 (two) times daily.   Dexlansoprazole 30 MG capsule Commonly known as: Dexilant Take 1 capsule (30 mg total) by mouth daily.   diltiazem 120 MG 24 hr capsule Commonly known as: CARDIZEM CD TAKE 1 CAPSULE(120 MG) BY  MOUTH DAILY What changed: See the new instructions.   DULoxetine 60 MG capsule Commonly known as: CYMBALTA Take 1 capsule (60 mg total) by mouth daily. What changed: when to take this   ipratropium-albuterol 0.5-2.5 (3) MG/3ML Soln Commonly known as: DUONEB Take 3 mLs by nebulization every 4 (four) hours as needed (shortness of breath or wheezing).   loratadine 10 MG tablet Commonly known as: CLARITIN Take 10 mg by mouth daily.   magnesium oxide 400 (241.3 Mg) MG tablet Commonly known as: MAG-OX 1 tablet twice daily What changed:   how much to take  how to take this  when to take this  additional instructions   metoCLOPramide 5 MG tablet Commonly known as: Reglan Take 1 tablet (5 mg total) by mouth 3 (three) times daily before meals.   metoprolol tartrate 25 MG tablet Commonly known as: LOPRESSOR Take 1 tablet (25 mg total) by mouth 2 (two) times daily.   multivitamin with minerals Tabs tablet Take 1 tablet by mouth daily.   ondansetron 4 MG tablet Commonly known as: ZOFRAN TAKE 1 TABLET(4 MG) BY MOUTH TWICE DAILY AS NEEDED FOR NAUSEA OR VOMITING What changed: See the new instructions.   potassium chloride 10 MEQ tablet Commonly known as: K-DUR Take 1 tablet (10 mEq total)  by mouth 2 (two) times daily.   predniSONE 10 MG tablet Commonly known as: DELTASONE Taper dose: 60mg  po daily x 3 days, then 40mg  po daily x 3 days, then 20mg  po daily x 3 days, then 10mg  po daily x 3 days, then 5mg  po daily x 2 days, then stop. Zero refills   prochlorperazine 10 MG tablet Commonly known as: COMPAZINE TAKE 1 TABLET(10 MG) BY MOUTH EVERY 6 HOURS AS NEEDED FOR NAUSEA OR VOMITING What changed: See the new instructions.   QUEtiapine 100 MG tablet Commonly known as: SEROQUEL TAKE 1 TABLET(100 MG) BY MOUTH AT BEDTIME What changed: See the new instructions.   rOPINIRole 0.25 MG tablet Commonly known as: REQUIP TAKE 2 TABLETS(0.5 MG) BY MOUTH AT BEDTIME What changed: See  the new instructions.   torsemide 20 MG tablet Commonly known as: DEMADEX Take 1 tablet (20 mg total) by mouth 2 (two) times daily.   Trelegy Ellipta 100-62.5-25 MCG/INH Aepb Generic drug: Fluticasone-Umeclidin-Vilant INHALE 1 PUFF INTO THE LUNGS DAILY What changed: See the new instructions.   Ventolin HFA 108 (90 Base) MCG/ACT inhaler Generic drug: albuterol INHALE 2 PUFFS INTO THE LUNGS EVERY 6 HOURS AS NEEDED FOR WHEEZING OR SHORTNESS OF BREATH What changed: See the new instructions.   Vitamin D 50 MCG (2000 UT) Caps Take 2,000 Units by mouth daily.            Durable Medical Equipment  (From admission, onward)         Start     Ordered   06/19/19 1454  For home use only DME Nebulizer machine  Once    Question Answer Comment  Patient needs a nebulizer to treat with the following condition COPD (chronic obstructive pulmonary disease) ([place])   Length of Need 6 Months      06/19/19 1453         Follow-up Information    Arnoldo Lenis, MD Follow up on 07/07/2019.   Specialty: Cardiology Why: Please arrive 15 minutes early for your 1:00pm post-hospital follow-up appointment Contact information: [place] [address] [place] Follow up on 06/28/2019.   Specialty: Cardiology Why: Please present for blood work between the hours of 8am and 4pm for close monitoring of your kidney function and potassium level Contact information: [place]       Martinique, [name], MD. Schedule an appointment as soon as possible for a visit in 1 week(s).   Specialty: Family Medicine Contact information: [address] [place] [address] [phone]          Allergies  Allergen Reactions  . Morphine And Related Other (See Comments)    hallucinations    Consultations:  Cardiology  Palliative Care  Procedures/Studies: Ct Angio Chest Pe W Or Wo  Contrast  Result Date: 06/13/2019 CLINICAL DATA:  Lethargy. Bilateral lower extremity swelling. History of small cell lung cancer. EXAM: CT ANGIOGRAPHY CHEST WITH CONTRAST TECHNIQUE: Multidetector CT imaging of the chest was performed using the standard protocol during bolus administration of intravenous contrast. Multiplanar CT image reconstructions and MIPs were obtained to evaluate the vascular anatomy. CONTRAST:  66mL OMNIPAQUE IOHEXOL 350 MG/ML SOLN COMPARISON:  CT scans of the chest from January 11, 2019 and February 15, 2019. Chest x-ray June 12, 2019. PET-CT January 15, 2019. FINDINGS: Cardiovascular: Atherosclerotic changes are seen in the nonaneurysmal aorta. No dissection. Coronary artery calcifications are  noted. Cardiomegaly is stable. No pulmonary emboli identified. Mediastinum/Nodes: Small bilateral pleural effusions. No pericardial effusion. The esophagus and thyroid are normal. No adenopathy. Lungs/Pleura: There is debris layering in the trachea. The left mainstem bronchus and left-sided airways are unremarkable. The patient is status post right upper lobectomy. There is narrowing over a short distance at the origin of the right middle lobe bronchus there is mild narrowing in the proximal bronchus intermedius as seen on axial image 53. The remainder of the right-sided airways are unremarkable. No pneumothorax. Mild nodularity in the left apex is is stable, consistent with scarring based on the lack of uptake in this region on previous PET-CT imaging. Scattered ground-glass in the left lung is similar in the interval. These regions of ground-glass could represent focal atelectasis, infection, or inflammation. No new nodules or masses. Small bilateral pleural effusions with underlying atelectasis are identified. Upper Abdomen: No acute abnormality. Musculoskeletal: No chest wall abnormality. No acute or significant osseous findings. Review of the MIP images confirms the above findings. IMPRESSION: 1.  No pulmonary emboli identified. 2. There is short segment narrowing of the right middle lobe airway near its origin. There is mild narrowing of the bronchus intermedius. The narrowings could be postsurgical/therapeutic. No soft tissue mass seen in this region. 3. Small bilateral pleural effusions with underlying atelectasis. 4. Scattered ground-glass opacities in the left lung may represent regions of focal atelectasis. The finding is unchanged. Infectious or inflammatory causes are possible as well. 5. Atherosclerotic changes in the aorta. Coronary artery calcifications. Aortic Atherosclerosis (ICD10-I70.0). Electronically Signed   By: Dorise Bullion III M.D   On: 06/13/2019 17:09   Nm Pulmonary Perfusion  Result Date: 06/12/2019 CLINICAL DATA:  Shortness of breath EXAM: NUCLEAR MEDICINE PERFUSION LUNG SCAN TECHNIQUE: Perfusion images were obtained in multiple projections after intravenous injection of radiopharmaceutical. Ventilation scans intentionally deferred if perfusion scan and chest x-ray adequate for interpretation during COVID 19 epidemic. RADIOPHARMACEUTICALS:  4.17 mCi Tc-70m MAA IV COMPARISON:  Chest x-ray from earlier in the same day. FINDINGS: Adequate perfusion is noted throughout both lungs without focal defect to suggest pulmonary embolism. Elevation of the right hemidiaphragm is noted. IMPRESSION: No focal perfusion defect to suggest pulmonary embolism. Electronically Signed   By: Inez Catalina M.D.   On: 06/12/2019 23:04   Dg Chest Port 1 View  Result Date: 06/12/2019 CLINICAL DATA:  Pain following fall EXAM: PORTABLE CHEST 1 VIEW COMPARISON:  Apr 18, 2019 FINDINGS: There is postoperative change on the right with volume loss and elevation of the right hemidiaphragm. There is no edema or consolidation. Heart is mildly enlarged with pulmonary vascularity within normal limits. No adenopathy. There is aortic atherosclerosis. No bone lesions. IMPRESSION: Postoperative change on the right with  volume loss the. No edema or consolidation. Stable cardiac silhouette. Aortic Atherosclerosis (ICD10-I70.0). Electronically Signed   By: Lowella Grip III M.D.   On: 06/12/2019 09:15   Vas [place] Lower Extremity Venous (dvt)  Result Date: 06/14/2019  Lower Venous Study Indications: Edema.  Risk Factors: VQ scan negative for PE. Comparison Study: no prior Performing Technologist: June Leap RDMS, RVT  Examination Guidelines: A complete evaluation includes B-mode imaging, spectral Doppler, color Doppler, and power Doppler as needed of all accessible portions of each vessel. Bilateral testing is considered an integral part of a complete examination. Limited examinations for reoccurring indications may be performed as noted.  +---------+---------------+---------+-----------+----------+-------+ RIGHT    CompressibilityPhasicitySpontaneityPropertiesSummary +---------+---------------+---------+-----------+----------+-------+ CFV      Full  Yes      Yes                          +---------+---------------+---------+-----------+----------+-------+ SFJ      Full                                                 +---------+---------------+---------+-----------+----------+-------+ FV Prox  Full                                                 +---------+---------------+---------+-----------+----------+-------+ FV Mid   Full                                                 +---------+---------------+---------+-----------+----------+-------+ FV DistalFull                                                 +---------+---------------+---------+-----------+----------+-------+ PFV      Full                                                 +---------+---------------+---------+-----------+----------+-------+ POP      Full           Yes      Yes                          +---------+---------------+---------+-----------+----------+-------+ PTV      Full                                                  +---------+---------------+---------+-----------+----------+-------+ PERO     Full                                                 +---------+---------------+---------+-----------+----------+-------+   +---------+---------------+---------+-----------+----------+-------+ LEFT     CompressibilityPhasicitySpontaneityPropertiesSummary +---------+---------------+---------+-----------+----------+-------+ CFV      Full           Yes      Yes                          +---------+---------------+---------+-----------+----------+-------+ SFJ      Full                                                 +---------+---------------+---------+-----------+----------+-------+ FV Prox  Full                                                 +---------+---------------+---------+-----------+----------+-------+  FV Mid   Full                                                 +---------+---------------+---------+-----------+----------+-------+ FV DistalFull                                                 +---------+---------------+---------+-----------+----------+-------+ PFV      Full                                                 +---------+---------------+---------+-----------+----------+-------+ POP      Full           Yes      Yes                          +---------+---------------+---------+-----------+----------+-------+ PTV      Full                                                 +---------+---------------+---------+-----------+----------+-------+ PERO     Full                                                 +---------+---------------+---------+-----------+----------+-------+     Summary: Right: There is no evidence of deep vein thrombosis in the lower extremity. No cystic structure found in the popliteal fossa. Left: There is no evidence of deep vein thrombosis in the lower extremity. No cystic structure found in the popliteal  fossa.  *See table(s) above for measurements and observations. Electronically signed by Monica Martinez MD on 06/14/2019 at 5:07:21 PM.    Final      Subjective: Very eager to go home  Discharge Exam: Vitals:   06/19/19 1400 06/19/19 1446  BP: (!) 117/58   Pulse: 71 64  Resp: 17 (!) 23  Temp:    SpO2: 91% 94%   Vitals:   06/19/19 1100 06/19/19 1355 06/19/19 1400 06/19/19 1446  BP: (!) 109/56  (!) 117/58   Pulse: 73  71 64  Resp: (!) 23  17 (!) 23  Temp:  (!) 97.5 F (36.4 C)    TempSrc:  Oral    SpO2: 91%  91% 94%  Weight:      Height:        General: Pt is alert, awake, not in acute distress Cardiovascular: RRR, S1/S2 +, no rubs, no gallops Respiratory: CTA bilaterally, decreased breath sounds Abdominal: Soft, NT, ND, bowel sounds + Extremities: no edema, no cyanosis   The results of significant diagnostics from this hospitalization (including imaging, microbiology, ancillary and laboratory) are listed below for reference.     Microbiology: Recent Results (from the past 240 hour(s))  SARS Coronavirus 2 (CEPHEID - Performed in [place] hospital lab), Hosp Order     Status: None   Collection Time:  06/12/19 10:59 AM   Specimen: Nasopharyngeal Swab  Result Value Ref Range Status   SARS Coronavirus 2 NEGATIVE NEGATIVE Final    Comment: (NOTE) If result is NEGATIVE SARS-CoV-2 target nucleic acids are NOT DETECTED. The SARS-CoV-2 RNA is generally detectable in upper and lower  respiratory specimens during the acute phase of infection. The lowest  concentration of SARS-CoV-2 viral copies this assay can detect is 250  copies / mL. A negative result does not preclude SARS-CoV-2 infection  and should not be used as the sole basis for treatment or other  patient management decisions.  A negative result may occur with  improper specimen collection / handling, submission of specimen other  than nasopharyngeal swab, presence of viral mutation(s) within the  areas  targeted by this assay, and inadequate number of viral copies  (<250 copies / mL). A negative result must be combined with clinical  observations, patient history, and epidemiological information. If result is POSITIVE SARS-CoV-2 target nucleic acids are DETECTED. The SARS-CoV-2 RNA is generally detectable in upper and lower  respiratory specimens dur ing the acute phase of infection.  Positive  results are indicative of active infection with SARS-CoV-2.  Clinical  correlation with patient history and other diagnostic information is  necessary to determine patient infection status.  Positive results do  not rule out bacterial infection or co-infection with other viruses. If result is PRESUMPTIVE POSTIVE SARS-CoV-2 nucleic acids MAY BE PRESENT.   A presumptive positive result was obtained on the submitted specimen  and confirmed on repeat testing.  While 2019 novel coronavirus  (SARS-CoV-2) nucleic acids may be present in the submitted sample  additional confirmatory testing may be necessary for epidemiological  and / or clinical management purposes  to differentiate between  SARS-CoV-2 and other Sarbecovirus currently known to infect humans.  If clinically indicated additional testing with an alternate test  methodology [phone]) is advised. The SARS-CoV-2 RNA is generally  detectable in upper and lower respiratory sp ecimens during the acute  phase of infection. The expected result is Negative. Fact Sheet for Patients:  StrictlyIdeas.no Fact Sheet for Healthcare Providers: BankingDealers.co.za This test is not yet approved or cleared by the [place] FDA and has been authorized for detection and/or diagnosis of SARS-CoV-2 by FDA under an Emergency Use Authorization (EUA).  This EUA will remain in effect (meaning this test can be used) for the duration of the COVID-19 declaration under Section 564(b)(1) of the Act, 21 U.S.C. section  360bbb-3(b)(1), unless the authorization is terminated or revoked sooner. Performed at [facility], [place] [address], [place], [place] [address]   MRSA PCR Screening     Status: None   Collection Time: 06/12/19  7:30 PM   Specimen: Nasal Mucosa; Nasopharyngeal  Result Value Ref Range Status   MRSA by PCR NEGATIVE NEGATIVE Final    Comment:        The GeneXpert MRSA Assay (FDA approved for NASAL specimens only), is one component of a comprehensive MRSA colonization surveillance program. It is not intended to diagnose MRSA infection nor to guide or monitor treatment for MRSA infections. Performed at [facility], [place] [address] [place], [place] [address]   Culture, blood (routine x 2)     Status: None   Collection Time: 06/13/19 11:48 AM   Specimen: BLOOD RIGHT WRIST  Result Value Ref Range Status   Specimen Description   Final    BLOOD RIGHT WRIST Performed at [facility], [address], [place], [place] [address]    Special Requests   Final    BOTTLES DRAWN AEROBIC ONLY Blood Culture results may not be optimal due to an inadequate volume of blood received in culture bottles Performed at [place] [address], [place], [place] [address]    Culture   Final    NO GROWTH 5 DAYS Performed at [facility], [place] [address], [place], [place] [address]    Report Status 06/18/2019 FINAL  Final  Culture, blood (routine x 2)     Status: None   Collection Time: 06/13/19 11:49 AM   Specimen: BLOOD RIGHT HAND  Result Value Ref Range Status   Specimen Description   Final    BLOOD RIGHT HAND Performed at [place] [address], [place], [place] [address]    Special Requests   Final    BOTTLES DRAWN AEROBIC ONLY Blood Culture results may not be optimal due to an inadequate volume of blood received in culture bottles Performed at [place] [address]  [place], [place] [address]    Culture   Final    NO GROWTH 5 DAYS Performed at [facility], [place] [address], [place],  [address]    Report Status 06/18/2019 FINAL  Final     Labs: BNP (last 3 results) Recent Labs    06/12/19 0845  BNP 7,209.4*   Basic Metabolic Panel: Recent Labs  Lab 06/14/19 0250 06/15/19 0244 06/16/19 0248 06/17/19 0038 06/19/19 0334 06/19/19 1411  NA 145 147* 143 143 140  --   K 2.8* 3.3* 2.9* 2.7* 2.7* 3.9  CL 92* 96* 87* 88* 88*  --   CO2 40* 37* 38* 37* 39*  --   GLUCOSE 122* 87 123* 109* 162*  --   BUN 20 17 21  26* 35*  --   CREATININE 0.78 0.69 0.58 0.70 0.77  --   CALCIUM 9.0 9.4 9.7 9.7 9.5  --   MG 1.5* 1.8 1.7  --  1.8  --   PHOS  --  3.7 4.0  --   --   --    Liver Function Tests: Recent Labs  Lab 06/14/19 0250 06/15/19 0244 06/16/19 0248 06/17/19 0038  AST 19  --   --  14*  ALT 21  --   --  16  ALKPHOS 131*  --   --  120  BILITOT 0.3  --   --  1.4*  PROT 5.7*  --   --  6.7  ALBUMIN 3.5 3.9 4.2 3.9   No results for input(s): LIPASE, AMYLASE in the last 168 hours. No results for input(s): AMMONIA in the last 168 hours. CBC: Recent Labs  Lab 06/13/19 0243 06/14/19 0250 06/14/19 1238 06/15/19 0244 06/15/19 1925 06/16/19 0248 06/17/19 0038  WBC 22.7* 16.0*  --  10.3  --  13.2* 12.4*  NEUTROABS  --  11.6*  --  7.2  --  10.4*  --   HGB 8.8* 6.9* 7.8* 7.5* 10.4* 10.4* 10.9*  HCT 28.7* 22.5* 24.1* 24.4* 33.1* 33.5* 34.5*  MCV 93.5 91.5  --  91.7  --  91.5 91.0  PLT 227 246  --  228  --  [phone]   Cardiac Enzymes: No results for input(s): CKTOTAL, CKMB, CKMBINDEX, TROPONINI in the last 168 hours. BNP: Invalid input(s): POCBNP CBG: No results for input(s): GLUCAP in the last 168 hours. D-Dimer No results for input(s): DDIMER  in the last 72 hours. Hgb A1c No results for input(s): HGBA1C in the last 72 hours. Lipid Profile No results for input(s): CHOL, HDL, LDLCALC, TRIG, CHOLHDL, LDLDIRECT in the last 72  hours. Thyroid function studies No results for input(s): TSH, T4TOTAL, T3FREE, THYROIDAB in the last 72 hours.  Invalid input(s): FREET3 Anemia work up No results for input(s): VITAMINB12, FOLATE, FERRITIN, TIBC, IRON, RETICCTPCT in the last 72 hours. Urinalysis    Component Value Date/Time   COLORURINE YELLOW 06/17/2019 1741   APPEARANCEUR CLEAR 06/17/2019 1741   LABSPEC 1.011 06/17/2019 1741   PHURINE 7.0 06/17/2019 1741   GLUCOSEU NEGATIVE 06/17/2019 1741   GLUCOSEU NEGATIVE 08/04/2018 1721   HGBUR NEGATIVE 06/17/2019 1741   BILIRUBINUR NEGATIVE 06/17/2019 1741   KETONESUR 20 (A) 06/17/2019 1741   PROTEINUR NEGATIVE 06/17/2019 1741   UROBILINOGEN 0.2 08/04/2018 1721   NITRITE NEGATIVE 06/17/2019 1741   LEUKOCYTESUR NEGATIVE 06/17/2019 1741   Sepsis Labs Invalid input(s): PROCALCITONIN,  WBC,  LACTICIDVEN Microbiology Recent Results (from the past 240 hour(s))  SARS Coronavirus 2 (CEPHEID - Performed in [place] hospital lab), Hosp Order     Status: None   Collection Time: 06/12/19 10:59 AM   Specimen: Nasopharyngeal Swab  Result Value Ref Range Status   SARS Coronavirus 2 NEGATIVE NEGATIVE Final    Comment: (NOTE) If result is NEGATIVE SARS-CoV-2 target nucleic acids are NOT DETECTED. The SARS-CoV-2 RNA is generally detectable in upper and lower  respiratory specimens during the acute phase of infection. The lowest  concentration of SARS-CoV-2 viral copies this assay can detect is 250  copies / mL. A negative result does not preclude SARS-CoV-2 infection  and should not be used as the sole basis for treatment or other  patient management decisions.  A negative result may occur with  improper specimen collection / handling, submission of specimen other  than nasopharyngeal swab, presence of viral mutation(s) within the  areas targeted by this assay, and inadequate number of viral copies  (<250 copies / mL). A negative result must be combined with clinical   observations, patient history, and epidemiological information. If result is POSITIVE SARS-CoV-2 target nucleic acids are DETECTED. The SARS-CoV-2 RNA is generally detectable in upper and lower  respiratory specimens dur ing the acute phase of infection.  Positive  results are indicative of active infection with SARS-CoV-2.  Clinical  correlation with patient history and other diagnostic information is  necessary to determine patient infection status.  Positive results do  not rule out bacterial infection or co-infection with other viruses. If result is PRESUMPTIVE POSTIVE SARS-CoV-2 nucleic acids MAY BE PRESENT.   A presumptive positive result was obtained on the submitted specimen  and confirmed on repeat testing.  While 2019 novel coronavirus  (SARS-CoV-2) nucleic acids may be present in the submitted sample  additional confirmatory testing may be necessary for epidemiological  and / or clinical management purposes  to differentiate between  SARS-CoV-2 and other Sarbecovirus currently known to infect humans.  If clinically indicated additional testing with an alternate test  methodology [phone]) is advised. The SARS-CoV-2 RNA is generally  detectable in upper and lower respiratory sp ecimens during the acute  phase of infection. The expected result is Negative. Fact Sheet for Patients:  StrictlyIdeas.no Fact Sheet for Healthcare Providers: BankingDealers.co.za This test is not yet approved or cleared by the [place] FDA and has been authorized for detection and/or diagnosis of SARS-CoV-2 by FDA under an Emergency Use Authorization (EUA).  This EUA  will remain in effect (meaning this test can be used) for the duration of the COVID-19 declaration under Section 564(b)(1) of the Act, 21 U.S.C. section 360bbb-3(b)(1), unless the authorization is terminated or revoked sooner. Performed at [facility], [place]  [address], [place], [place] [address]   MRSA PCR Screening     Status: None   Collection Time: 06/12/19  7:30 PM   Specimen: Nasal Mucosa; Nasopharyngeal  Result Value Ref Range Status   MRSA by PCR NEGATIVE NEGATIVE Final    Comment:        The GeneXpert MRSA Assay (FDA approved for NASAL specimens only), is one component of a comprehensive MRSA colonization surveillance program. It is not intended to diagnose MRSA infection nor to guide or monitor treatment for MRSA infections. Performed at [facility] [place] [address] [place], [place] [address]   Culture, blood (routine x 2)     Status: None   Collection Time: 06/13/19 11:48 AM   Specimen: BLOOD RIGHT WRIST  Result Value Ref Range Status   Specimen Description   Final    BLOOD RIGHT WRIST Performed at [facility], [address] [place], [place] [address]    Special Requests   Final    BOTTLES DRAWN AEROBIC ONLY Blood Culture results may not be optimal due to an inadequate volume of blood received in culture bottles Performed at [place] [address], [place], [place] [address]    Culture   Final    NO GROWTH 5 DAYS Performed at [facility], [place] [address] [place], [place] [address]    Report Status 06/18/2019 FINAL  Final  Culture, blood (routine x 2)     Status: None   Collection Time: 06/13/19 11:49 AM   Specimen: BLOOD RIGHT HAND  Result Value Ref Range Status   Specimen Description   Final    BLOOD RIGHT HAND Performed at [place] [address] [place], [place] [address]    Special Requests   Final    BOTTLES DRAWN AEROBIC ONLY Blood Culture results may not be optimal due to an inadequate volume of blood received in culture bottles Performed at [facility] [address], [place], [place] [address]    Culture   Final    NO GROWTH 5 DAYS Performed at [facility], [place] [address] [place],   [address]    Report Status 06/18/2019 FINAL  Final   Time spent: 30 min  SIGNED:   Marylu Lund, MD  [facility] 06/19/2019, 3:04 PM  If 7PM-7AM, please contact night-coverage

## 2019-06-21 ENCOUNTER — Telehealth: Payer: Self-pay | Admitting: Medical Oncology

## 2019-06-21 ENCOUNTER — Telehealth: Payer: Self-pay | Admitting: Family Medicine

## 2019-06-21 ENCOUNTER — Inpatient Hospital Stay: Payer: Medicare HMO

## 2019-06-21 ENCOUNTER — Telehealth: Payer: Self-pay | Admitting: *Deleted

## 2019-06-21 NOTE — Telephone Encounter (Signed)
Husband Oliviarose Punch called requesting appt for lab and visit with Dr. Julien Nordmann this week since he has some help with pt now.  Schedule message sent. Richard[facility]   Phone    [phone].

## 2019-06-21 NOTE — Progress Notes (Signed)
CM got phone call from ICU charge RN, Gwendolyn Fill, stating family has been calling the unit stating patient did not get any home health services. CM followed up with [facility] rep, ellen, who states patient is in the system and should be getting a phone call today or tomorrow for scheduling but ellen will follow-up and find out why there has been a delay.  CM spoke with spouse and communicated that the patient has been set-up with [facility]. Spouse states he was aware of that ut when he called the office in [place] they did not have a record of his wife.  CM confirmed and reiterated to spouse that [facility] will be proving home health services and they will be calling him to schedule, rep Dorian Pod will also call patient to speak to her about the services that will be provided.

## 2019-06-21 NOTE — Telephone Encounter (Signed)
Spoke with Dr. Julien Nordmann.  Pt needs time to recuperate this week.  Keep scheduled  appts for 7/28 with Cassie, PA . Spoke with pt and sister Suanne Marker to inform them of MD's instructions.  Suanne Marker wanted to know if Dr. Julien Nordmann could order home health for pt.  Informed her that pt can contact her PCP for home health care if needed prior to being evaluated by Dr. Julien Nordmann.

## 2019-06-21 NOTE — Telephone Encounter (Signed)
Pt discharged from  hospital on Sunday .LOS sent to r/s for this week .

## 2019-06-21 NOTE — Telephone Encounter (Signed)
Tonya Bradley with Hospice calling for verbal orders to start Palliative Care  cb  [phone]

## 2019-06-21 NOTE — Telephone Encounter (Signed)
LM for Manus Gunning on private vm giving verbal orders to start Palliative Care. Advised to call office with any questions.

## 2019-06-22 ENCOUNTER — Telehealth: Payer: Self-pay | Admitting: Physician Assistant

## 2019-06-22 ENCOUNTER — Telehealth: Payer: Self-pay | Admitting: *Deleted

## 2019-06-22 ENCOUNTER — Ambulatory Visit: Payer: Medicare HMO | Admitting: Physician Assistant

## 2019-06-22 ENCOUNTER — Ambulatory Visit: Payer: Medicare HMO

## 2019-06-22 ENCOUNTER — Other Ambulatory Visit: Payer: Medicare HMO

## 2019-06-22 DIAGNOSIS — I2510 Atherosclerotic heart disease of native coronary artery without angina pectoris: Secondary | ICD-10-CM | POA: Diagnosis not present

## 2019-06-22 DIAGNOSIS — H269 Unspecified cataract: Secondary | ICD-10-CM | POA: Diagnosis not present

## 2019-06-22 DIAGNOSIS — I110 Hypertensive heart disease with heart failure: Secondary | ICD-10-CM | POA: Diagnosis not present

## 2019-06-22 DIAGNOSIS — C3491 Malignant neoplasm of unspecified part of right bronchus or lung: Secondary | ICD-10-CM | POA: Diagnosis not present

## 2019-06-22 DIAGNOSIS — I5032 Chronic diastolic (congestive) heart failure: Secondary | ICD-10-CM | POA: Diagnosis not present

## 2019-06-22 DIAGNOSIS — D630 Anemia in neoplastic disease: Secondary | ICD-10-CM | POA: Diagnosis not present

## 2019-06-22 DIAGNOSIS — J438 Other emphysema: Secondary | ICD-10-CM | POA: Diagnosis not present

## 2019-06-22 DIAGNOSIS — J9611 Chronic respiratory failure with hypoxia: Secondary | ICD-10-CM | POA: Diagnosis not present

## 2019-06-22 DIAGNOSIS — I4892 Unspecified atrial flutter: Secondary | ICD-10-CM | POA: Diagnosis not present

## 2019-06-22 NOTE — Telephone Encounter (Signed)
Scheduled appt per 7/20 sch message- per [facility] okay to move to following week . Pt aware of appt date and time

## 2019-06-22 NOTE — Telephone Encounter (Signed)
Clinic RN called patient. No answer and unable to LVM since voice mail is full

## 2019-06-23 ENCOUNTER — Ambulatory Visit: Payer: Medicare HMO

## 2019-06-24 ENCOUNTER — Ambulatory Visit: Payer: Medicare HMO

## 2019-06-24 DIAGNOSIS — I110 Hypertensive heart disease with heart failure: Secondary | ICD-10-CM | POA: Diagnosis not present

## 2019-06-24 DIAGNOSIS — I4892 Unspecified atrial flutter: Secondary | ICD-10-CM | POA: Diagnosis not present

## 2019-06-24 DIAGNOSIS — J438 Other emphysema: Secondary | ICD-10-CM | POA: Diagnosis not present

## 2019-06-24 DIAGNOSIS — J9611 Chronic respiratory failure with hypoxia: Secondary | ICD-10-CM | POA: Diagnosis not present

## 2019-06-24 DIAGNOSIS — I2510 Atherosclerotic heart disease of native coronary artery without angina pectoris: Secondary | ICD-10-CM | POA: Diagnosis not present

## 2019-06-24 DIAGNOSIS — H269 Unspecified cataract: Secondary | ICD-10-CM | POA: Diagnosis not present

## 2019-06-24 DIAGNOSIS — C3491 Malignant neoplasm of unspecified part of right bronchus or lung: Secondary | ICD-10-CM | POA: Diagnosis not present

## 2019-06-24 DIAGNOSIS — D630 Anemia in neoplastic disease: Secondary | ICD-10-CM | POA: Diagnosis not present

## 2019-06-24 DIAGNOSIS — I5032 Chronic diastolic (congestive) heart failure: Secondary | ICD-10-CM | POA: Diagnosis not present

## 2019-06-25 ENCOUNTER — Telehealth: Payer: Self-pay | Admitting: Family Medicine

## 2019-06-25 DIAGNOSIS — H269 Unspecified cataract: Secondary | ICD-10-CM | POA: Diagnosis not present

## 2019-06-25 DIAGNOSIS — J438 Other emphysema: Secondary | ICD-10-CM | POA: Diagnosis not present

## 2019-06-25 DIAGNOSIS — I2510 Atherosclerotic heart disease of native coronary artery without angina pectoris: Secondary | ICD-10-CM | POA: Diagnosis not present

## 2019-06-25 DIAGNOSIS — I110 Hypertensive heart disease with heart failure: Secondary | ICD-10-CM | POA: Diagnosis not present

## 2019-06-25 DIAGNOSIS — D630 Anemia in neoplastic disease: Secondary | ICD-10-CM | POA: Diagnosis not present

## 2019-06-25 DIAGNOSIS — J9611 Chronic respiratory failure with hypoxia: Secondary | ICD-10-CM | POA: Diagnosis not present

## 2019-06-25 DIAGNOSIS — I4892 Unspecified atrial flutter: Secondary | ICD-10-CM | POA: Diagnosis not present

## 2019-06-25 DIAGNOSIS — I5032 Chronic diastolic (congestive) heart failure: Secondary | ICD-10-CM | POA: Diagnosis not present

## 2019-06-25 DIAGNOSIS — C3491 Malignant neoplasm of unspecified part of right bronchus or lung: Secondary | ICD-10-CM | POA: Diagnosis not present

## 2019-06-25 MED ORDER — TRAMADOL HCL 50 MG PO TABS: 100.0000 mg | ORAL_TABLET | Freq: Four times a day (QID) | ORAL | 0 refills | Status: DC | PRN

## 2019-06-25 NOTE — Telephone Encounter (Signed)
Message sent to Dr. Jordan for review and approval. 

## 2019-06-25 NOTE — Telephone Encounter (Signed)
Home Health Verbal Orders - Caller/Agency: [facility] Number: [phone] Requesting OT/PT/Skilled Nursing/Social Work/Speech Therapy: OT Frequency: 1w 1 2w 1 1w 2

## 2019-06-25 NOTE — Telephone Encounter (Signed)
Hinton Dyer ([facility]// [phone]) called in for pt to let Dr Martinique know that pt is reporting significant pain in her back and neck.  Pt  describes pain as 8 out of 10 and has tried taking tylenol and/or ibuprofen without relief.   Please call Hinton Dyer to advise next steps.   *Voicemail is secure*

## 2019-06-25 NOTE — Telephone Encounter (Signed)
Spoke with Tonya Bradley and she stated that patient need something for pain, preferably Tramadol per Tonya Bradley. Tonya Bradley is a cancer patient who is a lot of pain. The oncology dr told patient to ask PCP. PCP is out of the office until Monday. Please advise.

## 2019-06-25 NOTE — Telephone Encounter (Signed)
Done

## 2019-06-25 NOTE — Telephone Encounter (Signed)
Message sent to Dr. Jordan for review. 

## 2019-06-25 NOTE — Telephone Encounter (Signed)
Medication Refill - Medication: magnesium oxide (MAG-OX) 400 (241.3 Mg) MG tablet, rOPINIRole (REQUIP) 0.25 MG tablet, TRELEGY ELLIPTA 100-62.5-25 MCG/INH AEPB  Per Hinton Dyer, [facility] Nurse, pt is almost out of these medications.  Please refill.   Preferred Pharmacy:  [facility] [place], [place] AT [facility] OF [place] [phone] (Phone) [phone] (Fax)

## 2019-06-28 ENCOUNTER — Other Ambulatory Visit: Payer: Self-pay | Admitting: Family Medicine

## 2019-06-28 ENCOUNTER — Telehealth: Payer: Self-pay | Admitting: Medical Oncology

## 2019-06-28 MED ORDER — ROPINIROLE HCL 0.25 MG PO TABS: 0.5000 mg | ORAL_TABLET | Freq: Every day | ORAL | 0 refills | Status: AC

## 2019-06-28 MED ORDER — TRELEGY ELLIPTA 100-62.5-25 MCG/INH IN AEPB: 1.0000 | INHALATION_SPRAY | Freq: Every day | RESPIRATORY_TRACT | 0 refills | Status: DC

## 2019-06-28 NOTE — Telephone Encounter (Signed)
Neck and back pain. Sometimes her neck is positioned downward  and  halfway to her chest. This started after she left hospital.  Requested pain med. Dr Sharlene Motts prescribed tramadol.I told her to call pharmacy.  Per husband at 0830 her "Oxygen sat is 85% on 4 liters" .

## 2019-06-28 NOTE — Telephone Encounter (Signed)
On hold to long with [facility] so I hung up.

## 2019-06-28 NOTE — Telephone Encounter (Signed)
Prescriptions sent to her pharmacy. Trilogy Ellipta most likely prescribed by pulmonologist, I did send a refill but in the future she needs to call her pulmonologist's office. Please ask her to try decreasing Requip from 2 tablets at bedtime to 1 tablet at bedtime. Thanks, BJ

## 2019-06-29 ENCOUNTER — Telehealth: Payer: Self-pay | Admitting: Family Medicine

## 2019-06-29 ENCOUNTER — Inpatient Hospital Stay: Payer: Medicare HMO

## 2019-06-29 ENCOUNTER — Inpatient Hospital Stay (HOSPITAL_BASED_OUTPATIENT_CLINIC_OR_DEPARTMENT_OTHER): Payer: Medicare HMO | Admitting: Physician Assistant

## 2019-06-29 ENCOUNTER — Encounter: Payer: Self-pay | Admitting: Family Medicine

## 2019-06-29 ENCOUNTER — Ambulatory Visit (INDEPENDENT_AMBULATORY_CARE_PROVIDER_SITE_OTHER): Payer: Medicare HMO | Admitting: Family Medicine

## 2019-06-29 ENCOUNTER — Telehealth: Payer: Self-pay | Admitting: Physician Assistant

## 2019-06-29 ENCOUNTER — Other Ambulatory Visit: Payer: Self-pay

## 2019-06-29 VITALS — BP 99/65 | HR 100

## 2019-06-29 VITALS — BP 113/65 | HR 101 | Temp 98.5°F | Resp 18 | Ht 62.0 in | Wt 138.1 lb

## 2019-06-29 DIAGNOSIS — I2510 Atherosclerotic heart disease of native coronary artery without angina pectoris: Secondary | ICD-10-CM | POA: Diagnosis not present

## 2019-06-29 DIAGNOSIS — M48062 Spinal stenosis, lumbar region with neurogenic claudication: Secondary | ICD-10-CM | POA: Diagnosis not present

## 2019-06-29 DIAGNOSIS — I4892 Unspecified atrial flutter: Secondary | ICD-10-CM | POA: Diagnosis not present

## 2019-06-29 DIAGNOSIS — R5383 Other fatigue: Secondary | ICD-10-CM | POA: Diagnosis not present

## 2019-06-29 DIAGNOSIS — D630 Anemia in neoplastic disease: Secondary | ICD-10-CM | POA: Diagnosis not present

## 2019-06-29 DIAGNOSIS — F419 Anxiety disorder, unspecified: Secondary | ICD-10-CM

## 2019-06-29 DIAGNOSIS — I110 Hypertensive heart disease with heart failure: Secondary | ICD-10-CM | POA: Diagnosis not present

## 2019-06-29 DIAGNOSIS — I959 Hypotension, unspecified: Secondary | ICD-10-CM | POA: Diagnosis not present

## 2019-06-29 DIAGNOSIS — M542 Cervicalgia: Secondary | ICD-10-CM | POA: Diagnosis not present

## 2019-06-29 DIAGNOSIS — I5032 Chronic diastolic (congestive) heart failure: Secondary | ICD-10-CM | POA: Diagnosis not present

## 2019-06-29 DIAGNOSIS — Z7189 Other specified counseling: Secondary | ICD-10-CM

## 2019-06-29 DIAGNOSIS — R600 Localized edema: Secondary | ICD-10-CM

## 2019-06-29 DIAGNOSIS — C3491 Malignant neoplasm of unspecified part of right bronchus or lung: Secondary | ICD-10-CM

## 2019-06-29 DIAGNOSIS — R531 Weakness: Secondary | ICD-10-CM

## 2019-06-29 DIAGNOSIS — J9611 Chronic respiratory failure with hypoxia: Secondary | ICD-10-CM | POA: Diagnosis not present

## 2019-06-29 DIAGNOSIS — J439 Emphysema, unspecified: Secondary | ICD-10-CM

## 2019-06-29 DIAGNOSIS — I509 Heart failure, unspecified: Secondary | ICD-10-CM

## 2019-06-29 DIAGNOSIS — C3411 Malignant neoplasm of upper lobe, right bronchus or lung: Secondary | ICD-10-CM | POA: Diagnosis not present

## 2019-06-29 DIAGNOSIS — H269 Unspecified cataract: Secondary | ICD-10-CM | POA: Diagnosis not present

## 2019-06-29 DIAGNOSIS — G4700 Insomnia, unspecified: Secondary | ICD-10-CM | POA: Diagnosis not present

## 2019-06-29 DIAGNOSIS — J438 Other emphysema: Secondary | ICD-10-CM | POA: Diagnosis not present

## 2019-06-29 LAB — CBC WITH DIFFERENTIAL (CANCER CENTER ONLY)
Abs Immature Granulocytes: 3.11 10*3/uL — ABNORMAL HIGH (ref 0.00–0.07)
Basophils Absolute: 0 10*3/uL (ref 0.0–0.1)
Basophils Relative: 0 %
Eosinophils Absolute: 0.1 10*3/uL (ref 0.0–0.5)
Eosinophils Relative: 0 %
HCT: 33 % — ABNORMAL LOW (ref 36.0–46.0)
Hemoglobin: 10.8 g/dL — ABNORMAL LOW (ref 12.0–15.0)
Immature Granulocytes: 9 %
Lymphocytes Relative: 12 %
Lymphs Abs: 3.8 10*3/uL (ref 0.7–4.0)
MCH: 29.5 pg (ref 26.0–34.0)
MCHC: 32.7 g/dL (ref 30.0–36.0)
MCV: 90.2 fL (ref 80.0–100.0)
Monocytes Absolute: 1.8 10*3/uL — ABNORMAL HIGH (ref 0.1–1.0)
Monocytes Relative: 5 %
Neutro Abs: 24.3 10*3/uL — ABNORMAL HIGH (ref 1.7–7.7)
Neutrophils Relative %: 74 %
Platelet Count: 280 10*3/uL (ref 150–400)
RBC: 3.66 MIL/uL — ABNORMAL LOW (ref 3.87–5.11)
RDW: 20.2 % — ABNORMAL HIGH (ref 11.5–15.5)
WBC Count: 33.1 10*3/uL — ABNORMAL HIGH (ref 4.0–10.5)
nRBC: 0.1 % (ref 0.0–0.2)

## 2019-06-29 LAB — CMP (CANCER CENTER ONLY)
ALT: 26 U/L (ref 0–44)
AST: 21 U/L (ref 15–41)
Albumin: 3.7 g/dL (ref 3.5–5.0)
Alkaline Phosphatase: 108 U/L (ref 38–126)
Anion gap: 14 (ref 5–15)
BUN: 28 mg/dL — ABNORMAL HIGH (ref 8–23)
CO2: 34 mmol/L — ABNORMAL HIGH (ref 22–32)
Calcium: 9.7 mg/dL (ref 8.9–10.3)
Chloride: 89 mmol/L — ABNORMAL LOW (ref 98–111)
Creatinine: 0.8 mg/dL (ref 0.44–1.00)
GFR, Est AFR Am: >60 mL/min (ref >60)
GFR, Estimated: >60 mL/min (ref >60)
Glucose, Bld: 132 mg/dL — ABNORMAL HIGH (ref 70–99)
Potassium: 4.5 mmol/L (ref 3.5–5.1)
Sodium: 137 mmol/L (ref 135–145)
Total Bilirubin: 0.2 mg/dL — ABNORMAL LOW (ref 0.3–1.2)
Total Protein: 6.7 g/dL (ref 6.5–8.1)

## 2019-06-29 LAB — MAGNESIUM: Magnesium: 1.4 mg/dL — CL (ref 1.7–2.4)

## 2019-06-29 MED ORDER — HYDROCODONE-ACETAMINOPHEN 5-325 MG PO TABS: 1.0000 | ORAL_TABLET | Freq: Four times a day (QID) | ORAL | 0 refills | Status: DC | PRN

## 2019-06-29 MED ORDER — METOPROLOL TARTRATE 25 MG PO TABS: 12.5000 mg | ORAL_TABLET | Freq: Two times a day (BID) | ORAL | 3 refills | Status: DC

## 2019-06-29 MED ORDER — MAGNESIUM OXIDE 400 (241.3 MG) MG PO TABS: 400.0000 mg | ORAL_TABLET | Freq: Two times a day (BID) | ORAL | 1 refills | Status: DC

## 2019-06-29 MED ORDER — QUETIAPINE FUMARATE 100 MG PO TABS: 150.0000 mg | ORAL_TABLET | Freq: Every day | ORAL | 0 refills | Status: DC

## 2019-06-29 MED ORDER — TRELEGY ELLIPTA 100-62.5-25 MCG/INH IN AEPB: 1.0000 | INHALATION_SPRAY | Freq: Every day | RESPIRATORY_TRACT | 3 refills | Status: DC

## 2019-06-29 NOTE — Assessment & Plan Note (Signed)
We discussed some side effects of tramadol, she is taking a high dose. Because tramadol is not helping, recommended to stop it. After discussion of some side effects, she agrees with trying hydrocodone-acetaminophen 5-325 mg every 6 hours as needed for pain. We discussed some of the current guidelines in regard to chronic opioid use for pain management. Follow-up in 10 days.

## 2019-06-29 NOTE — Telephone Encounter (Signed)
Error

## 2019-06-29 NOTE — Assessment & Plan Note (Signed)
Problem is well controlled at this time. Currently she is not taking torsemide. Continue lower extremity elevation and good skin care.

## 2019-06-29 NOTE — Assessment & Plan Note (Signed)
Problem is stable. Continue Xanax 0.5 mg twice daily and Cymbalta 60 mg daily.

## 2019-06-29 NOTE — Progress Notes (Signed)
[facility] OFFICE PROGRESS NOTE  Martinique, [name], MD [place] [address]  DIAGNOSIS: Limited stage, stage Ia (T1b, N0, M0) small cell lung cancer   PRIOR THERAPY: Status post right upper and middle bilobectomy with lymph node dissection under the care of Dr. [name] January 31, 2019.  CURRENT THERAPY: Chemotherapy with cisplatin 80 mg/M2 on day 1 and etoposide 100 mg/M2 on days 1, 2 and 3 every 3 weeks. Status post 3 cycles. Last dose 05/26/2019.  INTERVAL HISTORY: Tonya Bradley 68 y.o. female returns to the clinic for a follow-up visit.  The patient is experiencing generalized fatigue, weakness, and neck pain today.  The patient states that her neck pain started when she was in the hospital for a CHF exacerbation.  She denies any trauma or injuries.  She states that her neck pain is a throbbing, intermittent, nonradiating pain that is posterior and midline on her cervical spine.  She states her pain has somewhat improved over the last couple days.  He states that her pain is exacerbated by movement of her neck.  She denies any associate symptoms such as numbness, tingling, or weakness in her upper extremities. Also since being discharged from the hospital, she still is experiencing swelling bilaterally in her lower extremities.  She is planning to follow-up with her cardiologist later this week.   Regarding the patient's treatment, she experienced nausea and vomiting from her chemotherapy.  Today, she denies any fever, chills, or night sweats.  She denies any chest pain, cough, or hemoptysis.  She reports shortness of breath; however, she does state that this is improved since her hospitalization.  She denies any recent nausea, vomiting, diarrhea, or constipation.  She denies any headache or visual changes.  She is here today for evaluation before starting her final cycle with cycle #4.   MEDICAL HISTORY: Past Medical History:  Diagnosis Date  . Acute on  chronic diastolic (congestive) heart failure ([place]) 06/12/2019  . AKI (acute kidney injury) ([place])   . Anemia   . Anxiety   . Arthritis    "back"  . Atrial flutter ([place]) 03/13/2016  . CAD (coronary artery disease)   . Cancer ([place])    skin cancer  . Cataract   . Colon polyp   . Complication of anesthesia    BP dropped during esophagus diltation in 2017-admitted for 3 days.   Marland Kitchen COPD (chronic obstructive pulmonary disease) ([place])   . Depression   . Dysrhythmia   . Emphysema of lung ([place])   . GERD (gastroesophageal reflux disease)   . Hypercholesterolemia   . Lung cancer ([place])   . Osteoporosis   . Oxygen deficiency     ALLERGIES:  is allergic to morphine and related.  MEDICATIONS:  Current Outpatient Medications  Medication Sig Dispense Refill  . ALPRAZolam (XANAX) 0.5 MG tablet Take 1 tablet (0.5 mg total) by mouth 2 (two) times daily. 60 tablet 2  . atorvastatin (LIPITOR) 80 MG tablet TAKE 1 TABLET(80 MG) BY MOUTH DAILY (Patient taking differently: Take 80 mg by mouth daily at 6 PM. ) 90 tablet 1  . Cholecalciferol (VITAMIN D) 2000 units CAPS Take 2,000 Units by mouth daily.    . cycloSPORINE (RESTASIS) 0.05 % ophthalmic emulsion Place 1 drop into both eyes 2 (two) times daily.    Marland Kitchen Dexlansoprazole (DEXILANT) 30 MG capsule Take 1 capsule (30 mg total) by mouth daily. 90 capsule 3  . diltiazem (CARDIZEM CD) 120 MG 24  hr capsule TAKE 1 CAPSULE(120 MG) BY MOUTH DAILY (Patient taking differently: Take 120 mg by mouth daily. ) 90 capsule 2  . DULoxetine (CYMBALTA) 60 MG capsule Take 1 capsule (60 mg total) by mouth daily. (Patient taking differently: Take 60 mg by mouth at bedtime. ) 90 capsule 1  . Fluticasone-Umeclidin-Vilant (TRELEGY ELLIPTA) 100-62.5-25 MCG/INH AEPB Inhale 1 puff into the lungs daily. 60 each 3  . ipratropium-albuterol (DUONEB) 0.5-2.5 (3) MG/3ML SOLN Take 3 mLs by nebulization every 4 (four) hours as needed (shortness of breath or wheezing). 360 mL 0  . loratadine  (CLARITIN) 10 MG tablet Take 10 mg by mouth daily.    . magnesium oxide (MAG-OX) 400 (241.3 Mg) MG tablet Take 1 tablet (400 mg total) by mouth 2 (two) times daily. 60 tablet 1  . metoCLOPramide (REGLAN) 5 MG tablet Take 1 tablet (5 mg total) by mouth 3 (three) times daily before meals. 90 tablet 2  . metoprolol tartrate (LOPRESSOR) 25 MG tablet Take 0.5 tablets (12.5 mg total) by mouth 2 (two) times daily. 60 tablet 3  . Multiple Vitamin (MULTIVITAMIN WITH MINERALS) TABS tablet Take 1 tablet by mouth daily.    . ondansetron (ZOFRAN) 4 MG tablet TAKE 1 TABLET(4 MG) BY MOUTH TWICE DAILY AS NEEDED FOR NAUSEA OR VOMITING (Patient taking differently: Take 4 mg by mouth 2 (two) times daily as needed for nausea or vomiting. ) 40 tablet 0  . potassium chloride (K-DUR) 10 MEQ tablet Take 1 tablet (10 mEq total) by mouth 2 (two) times daily. 180 tablet 3  . predniSONE (DELTASONE) 10 MG tablet Taper dose: 60mg  po daily x 3 days, then 40mg  po daily x 3 days, then 20mg  po daily x 3 days, then 10mg  po daily x 3 days, then 5mg  po daily x 2 days, then stop. Zero refills  0  . prochlorperazine (COMPAZINE) 10 MG tablet TAKE 1 TABLET(10 MG) BY MOUTH EVERY 6 HOURS AS NEEDED FOR NAUSEA OR VOMITING (Patient taking differently: Take 10 mg by mouth every 6 (six) hours as needed for nausea or vomiting. ) 30 tablet 0  . QUEtiapine (SEROQUEL) 100 MG tablet Take 1.5 tablets (150 mg total) by mouth at bedtime. 45 tablet 0  . rOPINIRole (REQUIP) 0.25 MG tablet Take 2 tablets (0.5 mg total) by mouth at bedtime. 180 tablet 0  . VENTOLIN HFA 108 (90 Base) MCG/ACT inhaler INHALE 2 PUFFS INTO THE LUNGS EVERY 6 HOURS AS NEEDED FOR WHEEZING OR SHORTNESS OF BREATH (Patient taking differently: Inhale 2 puffs into the lungs every 6 (six) hours as needed (wheezing/shortness of breath). ) 18 g 0  . HYDROcodone-acetaminophen (NORCO/VICODIN) 5-325 MG tablet Take 1 tablet by mouth every 6 (six) hours as needed for moderate pain. (Patient not  taking: Reported on 06/29/2019) 40 tablet 0  . torsemide (DEMADEX) 20 MG tablet Take 1 tablet (20 mg total) by mouth 2 (two) times daily. (Patient not taking: Reported on 06/29/2019) 60 tablet 0   No current facility-administered medications for this visit.     SURGICAL HISTORY:  Past Surgical History:  Procedure Laterality Date  . A-FLUTTER ABLATION N/A 05/19/2017   Procedure: A-Flutter Ablation;  Surgeon: Evans Lance, MD;  Location: [facility];  Service: Cardiovascular;  Laterality: N/A;  . APPENDECTOMY    . BASAL CELL CARCINOMA EXCISION    . CARDIAC CATHETERIZATION    . CATARACT EXTRACTION Bilateral   . CHOLECYSTECTOMY    . COLONOSCOPY N/A 06/23/2014   Dr. Rourk:Rectal and  colonic polyps-removed, Pancolonic diverticulosis. lymphocytic colitis  . CORONARY ANGIOPLASTY WITH STENT PLACEMENT    . ESOPHAGEAL DILATION N/A 03/13/2016   Procedure: ESOPHAGEAL DILATION;  Surgeon: Daneil Dolin, MD;  [facility];  Service: Endoscopy;  Laterality: N/A;  . ESOPHAGOGASTRODUODENOSCOPY N/A 03/13/2016   Procedure: ESOPHAGOGASTRODUODENOSCOPY (EGD);  Surgeon: Daneil Dolin, MD;  Location: [facility];  Service: Endoscopy;  Laterality: N/A;  0930  . EYE SURGERY     cataract removed, bilateral  . IR THORACENTESIS ASP PLEURAL SPACE W/IMG GUIDE  02/15/2019  . LUMBAR LAMINECTOMY/DECOMPRESSION MICRODISCECTOMY N/A 01/19/2018   Procedure: L4-5 DECOMPRESSION;  Surgeon: Marybelle Killings, MD;  Location: [facility];  Service: Orthopedics;  Laterality: N/A;  . VIDEO ASSISTED THORACOSCOPY (VATS)/ LOBECTOMY Right 02/04/2019   Procedure: VIDEO ASSISTED THORACOSCOPY (VATS)/RIGHT UPPER AND MIDDLE LOBECTOMY;  Surgeon: Melrose Nakayama, MD;  Location: [facility];  Service: Thoracic;  Laterality: Right;  Marland Kitchen VIDEO BRONCHOSCOPY N/A 02/14/2019   Procedure: BRONCHOSCOPY WITH SEDATION;  Surgeon: Melrose Nakayama, MD;  Location[facility];  Service: Thoracic;  Laterality: N/A;    REVIEW OF SYSTEMS:   Review of  Systems  Constitutional: Positive for fatigue and generalized weakness, Negative for appetite change, chills, fever and unexpected weight change.  HENT: Negative for mouth sores, nosebleeds, sore throat and trouble swallowing.   Eyes: Negative for eye problems and icterus.  Respiratory: Positive for shortness of breath (improved). Negative for cough, hemoptysis, and wheezing.   Cardiovascular: Positive for bilateral lower extremity swelling. Negative for chest pain.  Gastrointestinal: Negative for abdominal pain, constipation, diarrhea, nausea and vomiting.  Genitourinary: Negative for bladder incontinence, difficulty urinating, dysuria, frequency and hematuria.   Musculoskeletal: Positive for neck pain. Negative for back pain, gait problem, and neck stiffness.  Skin: Negative for itching and rash.  Neurological: Negative for dizziness, extremity weakness, gait problem, headaches, light-headedness and seizures.  Hematological: Negative for adenopathy. Does not bruise/bleed easily.  Psychiatric/Behavioral: Negative for confusion, depression and sleep disturbance. The patient is not nervous/anxious.     PHYSICAL EXAMINATION:  Blood pressure 113/65, pulse (!) 101, temperature 98.5 F (36.9 C), temperature source Oral, resp. rate 18, height 5\' 2"  (1.575 m), weight 138 lb 1.6 oz (62.6 kg), SpO2 93 %.  ECOG PERFORMANCE STATUS: 1 - Symptomatic but completely ambulatory  Physical Exam  Constitutional: Oriented to person, place, and time and well-developed, well-nourished, and in no distress.  HENT:  Head: Normocephalic and atraumatic.  Mouth/Throat: Oropharynx is clear and moist. No oropharyngeal exudate.  Eyes: Conjunctivae are normal. Right eye exhibits no discharge. Left eye exhibits no discharge. No scleral icterus.  Neck: Normal range of motion but reports discomfort with extension of her neck. Neck supple.  Cardiovascular: Normal rate, regular rhythm, normal heart sounds and intact distal  pulses.   Pulmonary/Chest: Effort normal and breath sounds normal. No respiratory distress. No wheezes. No rales.  Abdominal: Soft. Bowel sounds are normal. Exhibits no distension and no mass. There is no tenderness.  Musculoskeletal: Bilateral lower extremity edema. Normal range of motion.  Lymphadenopathy:    No cervical adenopathy.  Neurological: Alert and oriented to person, place, and time. Exhibits normal muscle tone. Gait normal. Coordination normal.  Skin: Skin is warm and dry. No rash noted. Not diaphoretic. No erythema. No pallor.  Psychiatric: Mood, memory and judgment normal.  Vitals reviewed.  LABORATORY DATA: Lab Results  Component Value Date   WBC 33.1 (H) 06/29/2019   HGB 10.8 (L) 06/29/2019   HCT 33.0 (L) 06/29/2019  MCV 90.2 06/29/2019   PLT 280 06/29/2019      Chemistry      Component Value Date/Time   NA 137 06/29/2019 1431   NA 137 05/12/2017 1142   K 4.5 06/29/2019 1431   CL 89 (L) 06/29/2019 1431   CO2 34 (H) 06/29/2019 1431   BUN 28 (H) 06/29/2019 1431   BUN 18 05/12/2017 1142   CREATININE 0.80 06/29/2019 1431   CREATININE 0.66 06/19/2018 1554      Component Value Date/Time   CALCIUM 9.7 06/29/2019 1431   ALKPHOS 108 06/29/2019 1431   AST 21 06/29/2019 1431   ALT 26 06/29/2019 1431   BILITOT 0.2 (L) 06/29/2019 1431       RADIOGRAPHIC STUDIES:  Ct Angio Chest Pe W Or Wo Contrast  Result Date: 06/13/2019 CLINICAL DATA:  Lethargy. Bilateral lower extremity swelling. History of small cell lung cancer. EXAM: CT ANGIOGRAPHY CHEST WITH CONTRAST TECHNIQUE: Multidetector CT imaging of the chest was performed using the standard protocol during bolus administration of intravenous contrast. Multiplanar CT image reconstructions and MIPs were obtained to evaluate the vascular anatomy. CONTRAST:  47mL OMNIPAQUE IOHEXOL 350 MG/ML SOLN COMPARISON:  CT scans of the chest from January 11, 2019 and February 15, 2019. Chest x-ray June 12, 2019. PET-CT January 15, 2019. FINDINGS: Cardiovascular: Atherosclerotic changes are seen in the nonaneurysmal aorta. No dissection. Coronary artery calcifications are noted. Cardiomegaly is stable. No pulmonary emboli identified. Mediastinum/Nodes: Small bilateral pleural effusions. No pericardial effusion. The esophagus and thyroid are normal. No adenopathy. Lungs/Pleura: There is debris layering in the trachea. The left mainstem bronchus and left-sided airways are unremarkable. The patient is status post right upper lobectomy. There is narrowing over a short distance at the origin of the right middle lobe bronchus there is mild narrowing in the proximal bronchus intermedius as seen on axial image 53. The remainder of the right-sided airways are unremarkable. No pneumothorax. Mild nodularity in the left apex is is stable, consistent with scarring based on the lack of uptake in this region on previous PET-CT imaging. Scattered ground-glass in the left lung is similar in the interval. These regions of ground-glass could represent focal atelectasis, infection, or inflammation. No new nodules or masses. Small bilateral pleural effusions with underlying atelectasis are identified. Upper Abdomen: No acute abnormality. Musculoskeletal: No chest wall abnormality. No acute or significant osseous findings. Review of the MIP images confirms the above findings. IMPRESSION: 1. No pulmonary emboli identified. 2. There is short segment narrowing of the right middle lobe airway near its origin. There is mild narrowing of the bronchus intermedius. The narrowings could be postsurgical/therapeutic. No soft tissue mass seen in this region. 3. Small bilateral pleural effusions with underlying atelectasis. 4. Scattered ground-glass opacities in the left lung may represent regions of focal atelectasis. The finding is unchanged. Infectious or inflammatory causes are possible as well. 5. Atherosclerotic changes in the aorta. Coronary artery calcifications. Aortic  Atherosclerosis (ICD10-I70.0). Electronically Signed   By: Dorise Bullion III M.D   On: 06/13/2019 17:09   Nm Pulmonary Perfusion  Result Date: 06/12/2019 CLINICAL DATA:  Shortness of breath EXAM: NUCLEAR MEDICINE PERFUSION LUNG SCAN TECHNIQUE: Perfusion images were obtained in multiple projections after intravenous injection of radiopharmaceutical. Ventilation scans intentionally deferred if perfusion scan and chest x-ray adequate for interpretation during COVID 19 epidemic. RADIOPHARMACEUTICALS:  4.17 mCi Tc-50m MAA IV COMPARISON:  Chest x-ray from earlier in the same day. FINDINGS: Adequate perfusion is noted throughout both lungs without focal defect to  suggest pulmonary embolism. Elevation of the right hemidiaphragm is noted. IMPRESSION: No focal perfusion defect to suggest pulmonary embolism. Electronically Signed   By: Inez Catalina M.D.   On: 06/12/2019 23:04   Dg Chest Port 1 View  Result Date: 06/12/2019 CLINICAL DATA:  Pain following fall EXAM: PORTABLE CHEST 1 VIEW COMPARISON:  Apr 18, 2019 FINDINGS: There is postoperative change on the right with volume loss and elevation of the right hemidiaphragm. There is no edema or consolidation. Heart is mildly enlarged with pulmonary vascularity within normal limits. No adenopathy. There is aortic atherosclerosis. No bone lesions. IMPRESSION: Postoperative change on the right with volume loss the. No edema or consolidation. Stable cardiac silhouette. Aortic Atherosclerosis (ICD10-I70.0). Electronically Signed   By: Lowella Grip III M.D.   On: 06/12/2019 09:15   Vas [place] Lower Extremity Venous (dvt)  Result Date: 06/14/2019  Lower Venous Study Indications: Edema.  Risk Factors: VQ scan negative for PE. Comparison Study: no prior Performing Technologist: June Leap RDMS, RVT  Examination Guidelines: A complete evaluation includes B-mode imaging, spectral Doppler, color Doppler, and power Doppler as needed of all accessible portions of each vessel.  Bilateral testing is considered an integral part of a complete examination. Limited examinations for reoccurring indications may be performed as noted.  +---------+---------------+---------+-----------+----------+-------+ RIGHT    CompressibilityPhasicitySpontaneityPropertiesSummary +---------+---------------+---------+-----------+----------+-------+ CFV      Full           Yes      Yes                          +---------+---------------+---------+-----------+----------+-------+ SFJ      Full                                                 +---------+---------------+---------+-----------+----------+-------+ FV Prox  Full                                                 +---------+---------------+---------+-----------+----------+-------+ FV Mid   Full                                                 +---------+---------------+---------+-----------+----------+-------+ FV DistalFull                                                 +---------+---------------+---------+-----------+----------+-------+ PFV      Full                                                 +---------+---------------+---------+-----------+----------+-------+ POP      Full           Yes      Yes                          +---------+---------------+---------+-----------+----------+-------+ PTV  Full                                                 +---------+---------------+---------+-----------+----------+-------+ PERO     Full                                                 +---------+---------------+---------+-----------+----------+-------+   +---------+---------------+---------+-----------+----------+-------+ LEFT     CompressibilityPhasicitySpontaneityPropertiesSummary +---------+---------------+---------+-----------+----------+-------+ CFV      Full           Yes      Yes                          +---------+---------------+---------+-----------+----------+-------+  SFJ      Full                                                 +---------+---------------+---------+-----------+----------+-------+ FV Prox  Full                                                 +---------+---------------+---------+-----------+----------+-------+ FV Mid   Full                                                 +---------+---------------+---------+-----------+----------+-------+ FV DistalFull                                                 +---------+---------------+---------+-----------+----------+-------+ PFV      Full                                                 +---------+---------------+---------+-----------+----------+-------+ POP      Full           Yes      Yes                          +---------+---------------+---------+-----------+----------+-------+ PTV      Full                                                 +---------+---------------+---------+-----------+----------+-------+ PERO     Full                                                 +---------+---------------+---------+-----------+----------+-------+  Summary: Right: There is no evidence of deep vein thrombosis in the lower extremity. No cystic structure found in the popliteal fossa. Left: There is no evidence of deep vein thrombosis in the lower extremity. No cystic structure found in the popliteal fossa.  *See table(s) above for measurements and observations. Electronically signed by Monica Martinez MD on 06/14/2019 at 5:07:21 PM.    Final      ASSESSMENT/PLAN:  This is a very pleasant 68 year old Caucasian female with limited stage, small cell lung cancer.  She had a right upper lobe and middle lobe bilobectomy.  She was diagnosed in March 2020.  She is currently undergoing systemic adjuvant chemotherapy with cisplatin 80 mg/m2 on day 1 and etoposide 100 mg/m2 on days 1, 2, and 3 every 3 weeks.  She is status post 3 cycles.  She has been tolerating treatment fair  except she develops nausea and associated vomiting.  The patient was hospitalized after cycle #3 due to a CHF exacerbation.  The patient was seen with Dr. Julien Nordmann today.  Labs were reviewed with the patient.  The patient's magnesium was low at 1.4 today.  I sent a refill for magnesium oxide 400 mg p.o. twice daily to her pharmacy.  Additionally, I will arrange patient to receive an additional 4 g of magnesium sulfate via IV tomorrow.  Dr. Julien Nordmann had a lengthy discussion with the patient about her current condition and treatment options.  Given the patient's generalized weakness and fatigue today from her recent hospitalization, Dr. Julien Nordmann gave the patient the option of continuing with her final cycle of adjuvant chemotherapy versus stopping at this point having received 3 total cycles of adjuvant chemotherapy. The patient is interested in not undergoing her final cycle of chemotherapy.  I will arrange for the patient to have a restaging CT scan performed in 4 weeks.  We will see the patient back for follow-up visit in 4 weeks for evaluation and to review her scan results.  The patient will continue with her current pain regimen for her neck discomfort as it is helping her.   She will follow up with her cardiologist later this week for management of her CHF.   The patient was advised to call immediately if she has any concerning symptoms in the interval. The patient voices understanding of current disease status and treatment options and is in agreement with the current care plan. All questions were answered. The patient knows to call the clinic with any problems, questions or concerns. We can certainly see the patient much sooner if necessary  Orders Placed This Encounter  Procedures  . CT Chest W Contrast    Standing Status:   Future    Standing Expiration Date:   06/28/2020    Order Specific Question:   ** REASON FOR EXAM (FREE TEXT)    Answer:   Restaging Lung Cancer    Order Specific  Question:   If indicated for the ordered procedure, I authorize the administration of contrast media per Radiology protocol    Answer:   Yes    Order Specific Question:   Preferred imaging location?    Answer:   [facility]    Order Specific Question:   Radiology Contrast Protocol - do NOT remove file path    Answer:   [email or web address]  . CBC with Differential ([place] Only)    Standing Status:   Future    Standing Expiration Date:   06/28/2020  . CMP ([place] only)  Standing Status:   Future    Standing Expiration Date:   06/28/2020  . Magnesium    Standing Status:   Future    Standing Expiration Date:   06/28/2020     Tobe Sos , PA-C 06/29/19  ADDENDUM: Hematology/Oncology Attending: I had a face-to-face encounter with the patient today.  I recommended her care plan.  This is a very pleasant 68 years old white female with limited stage small cell lung cancer status post 3 cycles of systemic chemotherapy with cisplatin and etoposide.  The patient had a rough time with her treatment and she was recently admitted to the hospital with congestive heart failure and discharged recently.  She has not recovered yet from the recent hospitalization and is feeling more fatigued and tired. I gave the patient the option of proceeding with the last cycle of her treatment versus stopping treatment at this point and close monitoring.  The patient would like to stop treatment at this point and she will have repeat CT scan of the chest in 1 months. For the hypomagnesemia, will arrange for the patient to receive 4 g of magnesium sulfate intravenously tomorrow. She was advised to call immediately if she has any concerning symptoms in the interval.  Disclaimer: This note was dictated with voice recognition software. Similar sounding words can inadvertently be transcribed and may be missed upon review. Eilleen Kempf, MD 06/30/19

## 2019-06-29 NOTE — Assessment & Plan Note (Addendum)
Respiratory symptoms getting worse. Continue supplemental O2 continuously. Continue current management. Refills for Trelegy Allipta 100-62.5-25 mcg sent. Smoking cessation encouraged.

## 2019-06-29 NOTE — Assessment & Plan Note (Signed)
Mild improvement with Seroquel 100 mg at bedtime, will increase dose to 850 mg at bedtime. We discussed some side effects, including arrhythmias. Good sleep hygiene. Follow-up in 10 days.

## 2019-06-29 NOTE — Progress Notes (Signed)
Virtual Visit via Video Note   I connected with Tonya Bradley on 06/29/19 at  9:30 AM EDT by a video enabled telemedicine application and verified that I am speaking with the correct person using two identifiers.  Location patient: home Location provider:home office Persons participating in the virtual visit: patient, provider  I discussed the limitations of evaluation and management by telemedicine and the availability of in person appointments. The patient expressed understanding and agreed to proceed.   HPI: Tonya Bradley is a 68 yo female with Hx of tobacco use,CVD,cardiac arrhythmia, and COPD among some who is following on some of her chronic medical problems. Last seen on 03/15/19. Started chemotherapy since her last OV to treat small cell lung cancer right lung,stage Ia. S/P middle bilobectomy in 01/2019.  Feeling fatigue and nauseated. Sleeping most of the day. Palliative care was initiated,[facility].  BP is low today. She is on Metoprolol tartrate  25 mg bid and Diltiazem CD 120 mg daily. Amiodarone was discontinued. Negative for unusual headache,cardiac CP,or palpitations.  COPD,she is now on continues O2 supplementation 4 LPM per . + Exertional dyspnea and cough. Denies hemoptysis. Negative for orthopnea and PND. She has not followed with her pulmonologist in months,not sure about next apppt. She is on  LE edema has greatly improved, states that she has no swelling. She is not taken Torsemide. Denies decreased urine output,gross hematuria,or dysuria.   Seroquel 100 mg at bedtime has helped some with insomnia,still waking up every 3 hours. No side effects reported.  Anxiety,she is on Cymbalta 60 mg daily and Xanax 0.5 mg bid. Negative for depressed mood or suicidal thoughts.  C/O severe lower back pain. Throbbing LE pain,worse at night. 10/10,pain is constant. S/P lumbar laminectomy ,spinal cord decompression. She has not followed with pain management. Recently  Tramadol 50 mg to take 2 tabs q 6 hours was started. States that medication is not helping with pain. Interfering with sleep.   ROS: See pertinent positives and negatives per HPI.  Past Medical History:  Diagnosis Date  . Acute on chronic diastolic (congestive) heart failure ([place]) 06/12/2019  . AKI (acute kidney injury) ([place])   . Anemia   . Anxiety   . Arthritis    "back"  . Atrial flutter ([place]) 03/13/2016  . CAD (coronary artery disease)   . Cancer ([place])    skin cancer  . Cataract   . Colon polyp   . Complication of anesthesia    BP dropped during esophagus diltation in 2017-admitted for 3 days.   Marland Kitchen COPD (chronic obstructive pulmonary disease) ([place])   . Depression   . Dysrhythmia   . Emphysema of lung ([place])   . GERD (gastroesophageal reflux disease)   . Hypercholesterolemia   . Lung cancer ([place])   . Osteoporosis   . Oxygen deficiency     Past Surgical History:  Procedure Laterality Date  . A-FLUTTER ABLATION N/A 05/19/2017   Procedure: A-Flutter Ablation;  Surgeon: Evans Lance, MD;  Location: [facility];  Service: Cardiovascular;  Laterality: N/A;  . APPENDECTOMY    . BASAL CELL CARCINOMA EXCISION    . CARDIAC CATHETERIZATION    . CATARACT EXTRACTION Bilateral   . CHOLECYSTECTOMY    . COLONOSCOPY N/A 06/23/2014   Dr. Rourk:Rectal and colonic polyps-removed, Pancolonic diverticulosis. lymphocytic colitis  . CORONARY ANGIOPLASTY WITH STENT PLACEMENT    . ESOPHAGEAL DILATION N/A 03/13/2016   Procedure: ESOPHAGEAL DILATION;  Surgeon: Daneil Dolin, MD;  [facility];  Service: Endoscopy;  Laterality: N/A;  . ESOPHAGOGASTRODUODENOSCOPY N/A 03/13/2016   Procedure: ESOPHAGOGASTRODUODENOSCOPY (EGD);  Surgeon: Daneil Dolin, MD;  [facility];  Service: Endoscopy;  Laterality: N/A;  0930  . EYE SURGERY     cataract removed, bilateral  . IR THORACENTESIS ASP PLEURAL SPACE W/IMG GUIDE  02/15/2019  . LUMBAR LAMINECTOMY/DECOMPRESSION  MICRODISCECTOMY N/A 01/19/2018   Procedure: L4-5 DECOMPRESSION;  Surgeon: Marybelle Killings, MD;  Location: [facility];  Service: Orthopedics;  Laterality: N/A;  . VIDEO ASSISTED THORACOSCOPY (VATS)/ LOBECTOMY Right 02/04/2019   Procedure: VIDEO ASSISTED THORACOSCOPY (VATS)/RIGHT UPPER AND MIDDLE LOBECTOMY;  Surgeon: Melrose Nakayama, MD;  Location: [facility];  Service: Thoracic;  Laterality: Right;  Marland Kitchen VIDEO BRONCHOSCOPY N/A 02/14/2019   Procedure: BRONCHOSCOPY WITH SEDATION;  Surgeon: Melrose Nakayama, MD;  Location: [facility];  Service: Thoracic;  Laterality: N/A;    Family History  Problem Relation Age of Onset  . Breast cancer Sister 40  . Heart disease Sister   . Heart attack Father   . Heart disease Father   . Stroke Mother   . Heart disease Mother   . Hyperlipidemia Mother   . Heart attack Sister   . Heart disease Brother        ?heart failure  . Congestive Heart Failure Brother   . Hypertension Son   . Heart disease Maternal Grandfather   . Cancer Cousin        lymphoma  . Hypertension Son   . Stomach cancer Other   . Colon cancer Neg Hx   . Pancreatic cancer Neg Hx     Social History   Socioeconomic History  . Marital status: Married    Spouse name: Not on file  . Number of children: 3  . Years of education: 10  . Highest education level: Not on file  Occupational History  . Occupation: retired    Comment: customer service [employer]  . Financial resource strain: Not on file  . Food insecurity    Worry: Not on file    Inability: Not on file  . Transportation needs    Medical: Not on file    Non-medical: Not on file  Tobacco Use  . Smoking status: Current Every Day Smoker    Packs/day: 0.25    Years: 54.00    Pack years: 13.50    Types: Cigarettes    Start date: 06/14/1966  . Smokeless tobacco: Never Used  . Tobacco comment: 1-2 packs daily. Chantix too expensive; pt states that she quit 2 weeks ago (02/04/19)   Substance and Sexual Activity  .  Alcohol use: No    Alcohol/week: 0.0 standard drinks  . Drug use: No  . Sexual activity: Yes    Birth control/protection: Post-menopausal  Lifestyle  . Physical activity    Days per week: Not on file    Minutes per session: Not on file  . Stress: Not on file  Relationships  . Social Herbalist on phone: Not on file    Gets together: Not on file    Attends religious service: Not on file    Active member of club or organization: Not on file    Attends meetings of clubs or organizations: Not on file    Relationship status: Not on file  . Intimate partner violence    Fear of current or ex partner: Not on file    Emotionally abused: Not on file  Physically abused: Not on file    Forced sexual activity: Not on file  Other Topics Concern  . Not on file  Social History Narrative   Quit high school and got married in 10th grade, age 56   First husband died of colon cancer at young age   Married again 2018   Lives in own home   Daughter lives with her   Drinks coffee seldom, too much "sweet tea"      Current Outpatient Medications:  .  ALPRAZolam (XANAX) 0.5 MG tablet, Take 1 tablet (0.5 mg total) by mouth 2 (two) times daily., Disp: 60 tablet, Rfl: 2 .  atorvastatin (LIPITOR) 80 MG tablet, TAKE 1 TABLET(80 MG) BY MOUTH DAILY (Patient taking differently: Take 80 mg by mouth daily at 6 PM. ), Disp: 90 tablet, Rfl: 1 .  Cholecalciferol (VITAMIN D) 2000 units CAPS, Take 2,000 Units by mouth daily., Disp: , Rfl:  .  cycloSPORINE (RESTASIS) 0.05 % ophthalmic emulsion, Place 1 drop into both eyes 2 (two) times daily., Disp: , Rfl:  .  Dexlansoprazole (DEXILANT) 30 MG capsule, Take 1 capsule (30 mg total) by mouth daily., Disp: 90 capsule, Rfl: 3 .  diltiazem (CARDIZEM CD) 120 MG 24 hr capsule, TAKE 1 CAPSULE(120 MG) BY MOUTH DAILY (Patient taking differently: Take 120 mg by mouth daily. ), Disp: 90 capsule, Rfl: 2 .  DULoxetine (CYMBALTA) 60 MG capsule, Take 1 capsule (60 mg  total) by mouth daily. (Patient taking differently: Take 60 mg by mouth at bedtime. ), Disp: 90 capsule, Rfl: 1 .  Fluticasone-Umeclidin-Vilant (TRELEGY ELLIPTA) 100-62.5-25 MCG/INH AEPB, Inhale 1 puff into the lungs daily., Disp: 60 each, Rfl: 3 .  HYDROcodone-acetaminophen (NORCO/VICODIN) 5-325 MG tablet, Take 1 tablet by mouth every 6 (six) hours as needed for moderate pain. (Patient not taking: Reported on 06/29/2019), Disp: 40 tablet, Rfl: 0 .  ipratropium-albuterol (DUONEB) 0.5-2.5 (3) MG/3ML SOLN, Take 3 mLs by nebulization every 4 (four) hours as needed (shortness of breath or wheezing)., Disp: 360 mL, Rfl: 0 .  loratadine (CLARITIN) 10 MG tablet, Take 10 mg by mouth daily., Disp: , Rfl:  .  magnesium oxide (MAG-OX) 400 (241.3 Mg) MG tablet, Take 1 tablet (400 mg total) by mouth 2 (two) times daily., Disp: 60 tablet, Rfl: 1 .  metoCLOPramide (REGLAN) 5 MG tablet, Take 1 tablet (5 mg total) by mouth 3 (three) times daily before meals., Disp: 90 tablet, Rfl: 2 .  metoprolol tartrate (LOPRESSOR) 25 MG tablet, Take 0.5 tablets (12.5 mg total) by mouth 2 (two) times daily., Disp: 60 tablet, Rfl: 3 .  Multiple Vitamin (MULTIVITAMIN WITH MINERALS) TABS tablet, Take 1 tablet by mouth daily., Disp: , Rfl:  .  ondansetron (ZOFRAN) 4 MG tablet, TAKE 1 TABLET(4 MG) BY MOUTH TWICE DAILY AS NEEDED FOR NAUSEA OR VOMITING (Patient taking differently: Take 4 mg by mouth 2 (two) times daily as needed for nausea or vomiting. ), Disp: 40 tablet, Rfl: 0 .  potassium chloride (K-DUR) 10 MEQ tablet, Take 1 tablet (10 mEq total) by mouth 2 (two) times daily., Disp: 180 tablet, Rfl: 3 .  predniSONE (DELTASONE) 10 MG tablet, Taper dose: 60mg  po daily x 3 days, then 40mg  po daily x 3 days, then 20mg  po daily x 3 days, then 10mg  po daily x 3 days, then 5mg  po daily x 2 days, then stop. Zero refills, Disp: , Rfl: 0 .  prochlorperazine (COMPAZINE) 10 MG tablet, TAKE 1 TABLET(10 MG) BY MOUTH EVERY 6 HOURS  AS NEEDED FOR NAUSEA  OR VOMITING (Patient taking differently: Take 10 mg by mouth every 6 (six) hours as needed for nausea or vomiting. ), Disp: 30 tablet, Rfl: 0 .  QUEtiapine (SEROQUEL) 100 MG tablet, Take 1.5 tablets (150 mg total) by mouth at bedtime., Disp: 45 tablet, Rfl: 0 .  rOPINIRole (REQUIP) 0.25 MG tablet, Take 2 tablets (0.5 mg total) by mouth at bedtime., Disp: 180 tablet, Rfl: 0 .  torsemide (DEMADEX) 20 MG tablet, Take 1 tablet (20 mg total) by mouth 2 (two) times daily. (Patient not taking: Reported on 06/29/2019), Disp: 60 tablet, Rfl: 0 .  VENTOLIN HFA 108 (90 Base) MCG/ACT inhaler, INHALE 2 PUFFS INTO THE LUNGS EVERY 6 HOURS AS NEEDED FOR WHEEZING OR SHORTNESS OF BREATH (Patient taking differently: Inhale 2 puffs into the lungs every 6 (six) hours as needed (wheezing/shortness of breath). ), Disp: 18 g, Rfl: 0  EXAM:  VITALS per patient if applicable:BP 15/72   Pulse 100   GENERAL: alert, oriented, appears well and in no acute distress  HEENT: atraumatic, conjunctiva clear, no obvious facial abnormalities on inspection.  NECK: normal movements of the head and neck  LUNGS: on inspection no signs of respiratory distress, breathing rate appears normal, no obvious gross SOB, gasping or wheezing On supplemental O2 [place].  CV: no obvious cyanosis  PSYCH/NEURO: pleasant and cooperative, no obvious depression.Fairly groomed,+ anxious.  ASSESSMENT AND PLAN:  Discussed the following assessment and plan:  Hypotension, unspecified hypotension type - Plan: BP mildly, metoprolol tartrate dose decreased from 25 mg p.o. daily to 12.5 mg twice daily. Adequate hydration. Continue monitoring BP daily. If BP continues =<100/60, she can decrease Metoprolol from 12.5 mg bid to qd. No changes in Diltiazem for now.  Bilateral lower extremity edema Problem is well controlled at this time. Currently she is not taking torsemide. Continue lower extremity elevation and good skin care.   Spinal stenosis of  lumbar region with neurogenic claudication We discussed some side effects of tramadol, she is taking a high dose. Because tramadol is not helping, recommended to stop it. After discussion of some side effects, she agrees with trying hydrocodone-acetaminophen 5-325 mg every 6 hours as needed for pain. We discussed some of the current guidelines in regard to chronic opioid use for pain management. Follow-up in 10 days.  Chronic anxiety Problem is stable. Continue Xanax 0.5 mg twice daily and Cymbalta 60 mg daily.  Insomnia Mild improvement with Seroquel 100 mg at bedtime, will increase dose to 850 mg at bedtime. We discussed some side effects, including arrhythmias. Good sleep hygiene. Follow-up in 10 days.  COPD (chronic obstructive pulmonary disease) (HCC) Respiratory symptoms getting worse. Continue supplemental O2 continuously. Continue current management. Refills for Trelegy Allipta 100-62.5-25 mcg sent. Smoking cessation encouraged.     I discussed the assessment and treatment plan with the patient. She was provided an opportunity to ask questions and all were answered. She agreed with the plan and demonstrated an understanding of the instructions.    Return in about 10 days (around 07/09/2019) for pain management,insomnia,BP.    Marciana Uplinger Martinique, MD

## 2019-06-29 NOTE — Telephone Encounter (Signed)
Scheduled appt per 7/28 los - pt to get an updated schedule tomorrow

## 2019-06-30 ENCOUNTER — Inpatient Hospital Stay: Payer: Medicare HMO

## 2019-06-30 ENCOUNTER — Other Ambulatory Visit: Payer: Self-pay

## 2019-06-30 ENCOUNTER — Encounter: Payer: Self-pay | Admitting: Physician Assistant

## 2019-06-30 MED ORDER — SODIUM CHLORIDE 0.9 % IV SOLN: Freq: Once | INTRAVENOUS | Status: DC

## 2019-06-30 MED ORDER — MAGNESIUM SULFATE 4 GM/100ML IV SOLN
4.0000 g | Freq: Once | INTRAVENOUS | Status: AC
  Administered 2019-06-30: 09:00:00 4 g via INTRAVENOUS
  Filled 2019-06-30: qty 100

## 2019-06-30 MED ORDER — SODIUM CHLORIDE 0.9 % IV SOLN
INTRAVENOUS | Status: DC
  Administered 2019-06-30: 09:00:00 via INTRAVENOUS
  Filled 2019-06-30: qty 250

## 2019-06-30 NOTE — Patient Instructions (Signed)
Magnesium Sulfate injection What is this medicine? MAGNESIUM SULFATE (mag NEE zee um SUL fate) is an electrolyte injection commonly used to treat low magnesium levels in your blood. It is also used to prevent or control seizures in women with preeclampsia or eclampsia. This medicine may be used for other purposes; ask your health care provider or pharmacist if you have questions. What should I tell my health care provider before I take this medicine? They need to know if you have any of these conditions:  heart disease  history of irregular heart beat  kidney disease  an unusual or allergic reaction to magnesium sulfate, medicines, foods, dyes, or preservatives  pregnant or trying to get pregnant  breast-feeding How should I use this medicine? This medicine is for infusion into a vein. It is given by a health care professional in a hospital or clinic setting. Talk to your pediatrician regarding the use of this medicine in children. While this drug may be prescribed for selected conditions, precautions do apply. Overdosage: If you think you have taken too much of this medicine contact a poison control center or emergency room at once. NOTE: This medicine is only for you. Do not share this medicine with others. What if I miss a dose? This does not apply. What may interact with this medicine? This medicine may interact with the following medications:  certain medicines for anxiety or sleep  certain medicines for seizures like phenobarbital  digoxin  medicines that relax muscles for surgery  narcotic medicines for pain This list may not describe all possible interactions. Give your health care provider a list of all the medicines, herbs, non-prescription drugs, or dietary supplements you use. Also tell them if you smoke, drink alcohol, or use illegal drugs. Some items may interact with your medicine. What should I watch for while using this medicine? Your condition will be  monitored carefully while you are receiving this medicine. You may need blood work done while you are receiving this medicine. What side effects may I notice from receiving this medicine? Side effects that you should report to your doctor or health care professional as soon as possible:  allergic reactions like skin rash, itching or hives, swelling of the face, lips, or tongue  facial flushing  muscle weakness  signs and symptoms of low blood pressure like dizziness; feeling faint or lightheaded, falls; unusually weak or tired  signs and symptoms of a dangerous change in heartbeat or heart rhythm like chest pain; dizziness; fast or irregular heartbeat; palpitations; breathing problems  sweating This list may not describe all possible side effects. Call your doctor for medical advice about side effects. You may report side effects to FDA at 1-800-FDA-1088. Where should I keep my medicine? This drug is given in a hospital or clinic and will not be stored at home. NOTE: This sheet is a summary. It may not cover all possible information. If you have questions about this medicine, talk to your doctor, pharmacist, or health care provider.  2020 Elsevier/Gold Standard (2016-06-05 12:31:42)  

## 2019-07-01 ENCOUNTER — Ambulatory Visit: Payer: Medicare HMO

## 2019-07-01 ENCOUNTER — Ambulatory Visit (INDEPENDENT_AMBULATORY_CARE_PROVIDER_SITE_OTHER): Payer: Medicare HMO | Admitting: Cardiology

## 2019-07-01 ENCOUNTER — Encounter: Payer: Self-pay | Admitting: Cardiology

## 2019-07-01 VITALS — BP 108/52 | HR 96 | Ht 62.0 in | Wt 137.0 lb

## 2019-07-01 DIAGNOSIS — I5032 Chronic diastolic (congestive) heart failure: Secondary | ICD-10-CM

## 2019-07-01 MED ORDER — TORSEMIDE 20 MG PO TABS: 20.0000 mg | ORAL_TABLET | ORAL | 3 refills | Status: DC | PRN

## 2019-07-01 NOTE — Telephone Encounter (Signed)
Pt notified that per Dr. Martinique 'I did send a refill but in the future she needs to call her pulmonologist's office. Please ask her to try decreasing Requip from 2 tablets at bedtime to 1 tablet at bedtime."   Pt verbalized understanding and stated she is already taking 1 tablet of Requip at bedtime.

## 2019-07-01 NOTE — Progress Notes (Signed)
07/01/2019 Tonya Bradley   26-Sep-1951  [id number]  Primary Physician [place], [name], MD Primary Cardiologist: Carlyle Dolly, MD  Electrophysiologist: None   Reason for Visit/CC: [facility] f/u for A/C Diastolic HF  HPI:  Tonya Bradley a 68 y.o.femalewith a hx ofchronic diastolic congestive heart failure, atrial flutter s/p ablation in 2018 and taken off of anticoagulation, coronary artery disease, COPD, active small cell lung cancer, tobacco abuse who presents to clinic today for post hospital f/u.   She was recently admitted to [facility] for acute on chronic diastolic CHF. BNP noted to be 2597, troponin negative, EKG with no acute ST changes. Chest x-ray showed no consolidation or edema. Echo showed normal LVEF 60-65%. No significant valvular disase. She was treated w/ Lasix and volume improved but she still had some dyspnea, felt likely 2/2 to her worsening anemia. She is suspected to have anemia of chronic disease from malignancy.  She required blood transfusion. Also has issues with hypotension and required temporarily pressor support but BP later improved. Prior to d/c, she was transitioned to PO diuretics, Torsemide, but per pt and her husband, this has been discontinued, for reasons she is unsure of.   She is here today in a wheel chair. Her husband is with her. She is very weak and fatigue. She has been getting palliative care assistance at home and now considering transition to hospipce. She saw Dr. Julien Nordmann yesterday and reports they have decided to stop chemo for now and she will get a f/u scan in a few weeks.   She has concerns regarding her CHF. She notes 7 lb weight gain since leaving the hospital. She thinks her ankles are swollen and feels that she is more bloated/ has abdominal edema. Breathing is ok. She is on supplemental O2.    Cardiac Studies  2D Echo 06/13/2019  IMPRESSIONS    1. The left ventricle has normal systolic function with an ejection fraction of  60-65%. The cavity size was normal. Indeterminate diastolic filling due to E-A fusion.  2. The right ventricle has normal systolic function. The cavity was normal. There is no increase in right ventricular wall thickness. Right ventricular systolic pressure could not be assessed.  3. Left atrial size was mildly dilated.  4. The aortic valve is abnormal. Moderate calcification of the aortic valve. No stenosis of the aortic valve.  5. The inferior vena cava was dilated in size with <50% respiratory variability.   Current Meds  Medication Sig  . ALPRAZolam (XANAX) 0.5 MG tablet Take 1 tablet (0.5 mg total) by mouth 2 (two) times daily.  Marland Kitchen atorvastatin (LIPITOR) 80 MG tablet TAKE 1 TABLET(80 MG) BY MOUTH DAILY  . Cholecalciferol (VITAMIN D) 2000 units CAPS Take 2,000 Units by mouth daily.  . cycloSPORINE (RESTASIS) 0.05 % ophthalmic emulsion Place 1 drop into both eyes 2 (two) times daily.  Marland Kitchen Dexlansoprazole (DEXILANT) 30 MG capsule Take 1 capsule (30 mg total) by mouth daily.  Marland Kitchen diltiazem (CARDIZEM CD) 120 MG 24 hr capsule TAKE 1 CAPSULE(120 MG) BY MOUTH DAILY  . DULoxetine (CYMBALTA) 60 MG capsule Take 1 capsule (60 mg total) by mouth daily.  . Fluticasone-Umeclidin-Vilant (TRELEGY ELLIPTA) 100-62.5-25 MCG/INH AEPB Inhale 1 puff into the lungs daily.  Marland Kitchen HYDROcodone-acetaminophen (NORCO/VICODIN) 5-325 MG tablet Take 1 tablet by mouth every 6 (six) hours as needed for moderate pain.  Marland Kitchen ipratropium-albuterol (DUONEB) 0.5-2.5 (3) MG/3ML SOLN Take 3 mLs by nebulization every 4 (four) hours as needed (shortness of breath or  wheezing).  Marland Kitchen loratadine (CLARITIN) 10 MG tablet Take 10 mg by mouth daily.  . magnesium oxide (MAG-OX) 400 (241.3 Mg) MG tablet Take 1 tablet (400 mg total) by mouth 2 (two) times daily.  . metoCLOPramide (REGLAN) 5 MG tablet Take 1 tablet (5 mg total) by mouth 3 (three) times daily before meals.  . metoprolol tartrate (LOPRESSOR) 25 MG tablet Take 0.5 tablets (12.5 mg total) by  mouth 2 (two) times daily.  . Multiple Vitamin (MULTIVITAMIN WITH MINERALS) TABS tablet Take 1 tablet by mouth daily.  . ondansetron (ZOFRAN) 4 MG tablet TAKE 1 TABLET(4 MG) BY MOUTH TWICE DAILY AS NEEDED FOR NAUSEA OR VOMITING  . potassium chloride (K-DUR) 10 MEQ tablet Take 1 tablet (10 mEq total) by mouth 2 (two) times daily.  . predniSONE (DELTASONE) 10 MG tablet Taper dose: 60mg  po daily x 3 days, then 40mg  po daily x 3 days, then 20mg  po daily x 3 days, then 10mg  po daily x 3 days, then 5mg  po daily x 2 days, then stop. Zero refills  . prochlorperazine (COMPAZINE) 10 MG tablet TAKE 1 TABLET(10 MG) BY MOUTH EVERY 6 HOURS AS NEEDED FOR NAUSEA OR VOMITING  . QUEtiapine (SEROQUEL) 100 MG tablet Take 1.5 tablets (150 mg total) by mouth at bedtime.  Marland Kitchen rOPINIRole (REQUIP) 0.25 MG tablet Take 2 tablets (0.5 mg total) by mouth at bedtime.  . VENTOLIN HFA 108 (90 Base) MCG/ACT inhaler INHALE 2 PUFFS INTO THE LUNGS EVERY 6 HOURS AS NEEDED FOR WHEEZING OR SHORTNESS OF BREATH   Allergies  Allergen Reactions  . Morphine And Related Other (See Comments)    hallucinations   Past Medical History:  Diagnosis Date  . Acute on chronic diastolic (congestive) heart failure ([place]) 06/12/2019  . AKI (acute kidney injury) ([place])   . Anemia   . Anxiety   . Arthritis    "back"  . Atrial flutter ([place]) 03/13/2016  . CAD (coronary artery disease)   . Cancer ([place])    skin cancer  . Cataract   . Colon polyp   . Complication of anesthesia    BP dropped during esophagus diltation in 2017-admitted for 3 days.   Marland Kitchen COPD (chronic obstructive pulmonary disease) ([place])   . Depression   . Dysrhythmia   . Emphysema of lung ([place])   . GERD (gastroesophageal reflux disease)   . Hypercholesterolemia   . Lung cancer ([place])   . Osteoporosis   . Oxygen deficiency    Family History  Problem Relation Age of Onset  . Breast cancer Sister 51  . Heart disease Sister   . Heart attack Father   . Heart disease Father   .  Stroke Mother   . Heart disease Mother   . Hyperlipidemia Mother   . Heart attack Sister   . Heart disease Brother        ?heart failure  . Congestive Heart Failure Brother   . Hypertension Son   . Heart disease Maternal Grandfather   . Cancer Cousin        lymphoma  . Hypertension Son   . Stomach cancer Other   . Colon cancer Neg Hx   . Pancreatic cancer Neg Hx    Past Surgical History:  Procedure Laterality Date  . A-FLUTTER ABLATION N/A 05/19/2017   Procedure: A-Flutter Ablation;  Surgeon: Evans Lance, MD;  Location: [facility];  Service: Cardiovascular;  Laterality: N/A;  . APPENDECTOMY    . BASAL CELL CARCINOMA EXCISION    .  CARDIAC CATHETERIZATION    . CATARACT EXTRACTION Bilateral   . CHOLECYSTECTOMY    . COLONOSCOPY N/A 06/23/2014   Dr. Rourk:Rectal and colonic polyps-removed, Pancolonic diverticulosis. lymphocytic colitis  . CORONARY ANGIOPLASTY WITH STENT PLACEMENT    . ESOPHAGEAL DILATION N/A 03/13/2016   Procedure: ESOPHAGEAL DILATION;  Surgeon: Daneil Dolin, MD;  [facility];  Service: Endoscopy;  Laterality: N/A;  . ESOPHAGOGASTRODUODENOSCOPY N/A 03/13/2016   Procedure: ESOPHAGOGASTRODUODENOSCOPY (EGD);  Surgeon: Daneil Dolin, MD;  [facility];  Service: Endoscopy;  Laterality: N/A;  0930  . EYE SURGERY     cataract removed, bilateral  . IR THORACENTESIS ASP PLEURAL SPACE W/IMG GUIDE  02/15/2019  . LUMBAR LAMINECTOMY/DECOMPRESSION MICRODISCECTOMY N/A 01/19/2018   Procedure: L4-5 DECOMPRESSION;  Surgeon: Marybelle Killings, MD;  Location: [facility];  Service: Orthopedics;  Laterality: N/A;  . VIDEO ASSISTED THORACOSCOPY (VATS)/ LOBECTOMY Right 02/04/2019   Procedure: VIDEO ASSISTED THORACOSCOPY (VATS)/RIGHT UPPER AND MIDDLE LOBECTOMY;  Surgeon: Melrose Nakayama, MD;  Location: [facility];  Service: Thoracic;  Laterality: Right;  Marland Kitchen VIDEO BRONCHOSCOPY N/A 02/14/2019   Procedure: BRONCHOSCOPY WITH SEDATION;  Surgeon: Melrose Nakayama, MD;   Location: [facility];  Service: Thoracic;  Laterality: N/A;   Social History   Socioeconomic History  . Marital status: Married    Spouse name: Not on file  . Number of children: 3  . Years of education: 10  . Highest education level: Not on file  Occupational History  . 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 Financial resource strain: Not on file  . Food insecurity    Worry: Not on file    Inability: Not on file  . Transportation needs    Medical: Not on file    Non-medical: Not on file  Tobacco Use  . Smoking status: Current Every Day Smoker    Packs/day: 0.25    Years: 54.00    Pack years: 13.50    Types: Cigarettes    Start date: 06/14/1966  . Smokeless tobacco: Never Used  . Tobacco comment: 1-2 packs daily. Chantix too expensive; pt states that she quit 2 weeks ago (02/04/19)   Substance and Sexual Activity  . Alcohol use: No    Alcohol/week: 0.0 standard drinks  . Drug use: No  . Sexual activity: Yes    Birth control/protection: Post-menopausal  Lifestyle  . Physical activity    Days per week: Not on file    Minutes per session: Not on file  . Stress: Not on file  Relationships  . Social Herbalist on phone: Not on file    Gets together: Not on file    Attends religious service: Not on file    Active member of club or organization: Not on file    Attends meetings of clubs or organizations: Not on file    Relationship status: Not on file  . Intimate partner violence    Fear of current or ex partner: Not on file    Emotionally abused: Not on file    Physically abused: Not on file    Forced sexual activity: Not on file  Other Topics Concern  . Not on file  Social History Narrative   Quit high school and got married in 10th grade, age 27   First husband died of colon cancer at young age   Married again 2018   Lives in own home   Daughter lives  with her   Drinks coffee seldom, too much "sweet tea"     Lipid Panel      Component Value Date/Time   CHOL 163 11/07/2016 1526   TRIG 202 (H) 11/07/2016 1526   HDL 38 (L) 11/07/2016 1526   CHOLHDL 4.3 11/07/2016 1526   VLDL 40 (H) 11/07/2016 1526   LDLCALC 85 11/07/2016 1526    Review of Systems: General: negative for chills, fever, night sweats or weight changes.  Cardiovascular: negative for chest pain, dyspnea on exertion, edema, orthopnea, palpitations, paroxysmal nocturnal dyspnea or shortness of breath Dermatological: negative for rash Respiratory: negative for cough or wheezing Urologic: negative for hematuria Abdominal: negative for nausea, vomiting, diarrhea, bright red blood per rectum, melena, or hematemesis Neurologic: negative for visual changes, syncope, or dizziness All other systems reviewed and are otherwise negative except as noted above.   Physical Exam:  Blood pressure (!) 108/52, pulse 96, height 5\' 2"  (1.575 m), weight 137 lb (62.1 kg), SpO2 90 %.  General appearance: alert, cooperative, no distress and White female appears older than actual age looks fatigued Neck: no carotid bruit and no JVD Lungs: clear to auscultation bilaterally Heart: regular rate and rhythm, S1, S2 normal, no murmur, click, rub or gallop Extremities: Trace bilateral lower extremity edema Pulses: 2+ and symmetric Skin: Skin color, texture, turgor normal. No rashes or lesions Neurologic: Grossly normal  EKG not performed-- personally reviewed   ASSESSMENT AND PLAN:   1. Chronic Diastolic HF: pt notes 7 lb weight gain since hospital discharge, after she was instructed to stop her PO diuretics. She has trace LEE on my exam but she feels that she is retaining fluid more so in her abdomen. Her BP is ok today. She can take a dose of PO torsemide when she returns home and will monitor BP. Can use diuretic PRN for palliative/comfort care based on weight and increased dyspnea.   2. Lung Cancer: per pt and husband, it sounds like her overall prognosis is poor.  She made decision to stop chemo yesterday and has palliative care assistance at home (RN weekly) and considering transition to hospice.   3. H/O Atrial Flutter: s/p aflutter ablation in 2018 and anticoagulation was discontinued shortly after. Has been maintaining NSR but amiodarone recently discontinued in hospital due to worsening dyspnea (multifactoral - lung CA, anemia, chronic dCHF)  4. Chronic Anemia: followed by primary.    Follow-Up: based on pt and husband's report, she is nearing end of life. She has a recall for Dr. Lovena Le in October. Will keep but will not arrange any other f/u. Continue palliative care.   Tramel Westbrook Ladoris Gene, MHS [facility] 07/01/2019 12:13 PM

## 2019-07-01 NOTE — Telephone Encounter (Signed)
Message routed to PCP.

## 2019-07-01 NOTE — Telephone Encounter (Signed)
Last message wasn't routed

## 2019-07-01 NOTE — Patient Instructions (Signed)
Medication Instructions: Torsemide:  Take 1 tablet 20 mg when you get home and then take as needed If you need a refill on your cardiac medications before your next appointment, please call your pharmacy.   Lab work: none If you have labs (blood work) drawn today and your tests are completely normal, you will receive your results only by: Marland Kitchen MyChart Message (if you have MyChart) OR . A paper copy in the mail If you have any lab test that is abnormal or we need to change your treatment, we will call you to review the results.  Testing/Procedures: none  Follow-Up: At [facility], you and your health needs are our priority.  As part of our continuing mission to provide you with exceptional heart care, we have created designated Provider Care Teams.  These Care Teams include your primary Cardiologist (physician) and Advanced Practice Providers (APPs -  Physician Assistants and Nurse Practitioners) who all work together to provide you with the care you need, when you need it. .   Any Other Special Instructions Will Be Listed Below (If Applicable). WEIGH:  Weigh daily.  If you gain more than 3 lbs in 1 DAY or 5 lbs in 1 WEEK, call our office [phone]

## 2019-07-01 NOTE — Telephone Encounter (Signed)
Okay for verbal orders? Please advise 

## 2019-07-02 ENCOUNTER — Ambulatory Visit: Payer: Medicare HMO

## 2019-07-02 DIAGNOSIS — I2510 Atherosclerotic heart disease of native coronary artery without angina pectoris: Secondary | ICD-10-CM | POA: Diagnosis not present

## 2019-07-02 DIAGNOSIS — D630 Anemia in neoplastic disease: Secondary | ICD-10-CM | POA: Diagnosis not present

## 2019-07-02 DIAGNOSIS — I5032 Chronic diastolic (congestive) heart failure: Secondary | ICD-10-CM | POA: Diagnosis not present

## 2019-07-02 DIAGNOSIS — J9611 Chronic respiratory failure with hypoxia: Secondary | ICD-10-CM | POA: Diagnosis not present

## 2019-07-02 DIAGNOSIS — J438 Other emphysema: Secondary | ICD-10-CM | POA: Diagnosis not present

## 2019-07-02 DIAGNOSIS — C3491 Malignant neoplasm of unspecified part of right bronchus or lung: Secondary | ICD-10-CM | POA: Diagnosis not present

## 2019-07-02 DIAGNOSIS — I4892 Unspecified atrial flutter: Secondary | ICD-10-CM | POA: Diagnosis not present

## 2019-07-02 DIAGNOSIS — H269 Unspecified cataract: Secondary | ICD-10-CM | POA: Diagnosis not present

## 2019-07-02 DIAGNOSIS — I110 Hypertensive heart disease with heart failure: Secondary | ICD-10-CM | POA: Diagnosis not present

## 2019-07-02 NOTE — Telephone Encounter (Signed)
He is okay to give verbal authorization to requested services. Thanks, BJ

## 2019-07-03 ENCOUNTER — Emergency Department (HOSPITAL_COMMUNITY): Payer: Medicare HMO

## 2019-07-03 ENCOUNTER — Other Ambulatory Visit: Payer: Self-pay

## 2019-07-03 ENCOUNTER — Inpatient Hospital Stay (HOSPITAL_COMMUNITY)
Admission: EM | Admit: 2019-07-03 | Discharge: 2019-07-06 | DRG: 193 | Disposition: A | Discharge status: Home Health | Payer: Medicare HMO | Attending: Internal Medicine | Admitting: Internal Medicine

## 2019-07-03 ENCOUNTER — Encounter (HOSPITAL_COMMUNITY): Payer: Self-pay

## 2019-07-03 DIAGNOSIS — R197 Diarrhea, unspecified: Secondary | ICD-10-CM | POA: Diagnosis present

## 2019-07-03 DIAGNOSIS — Z66 Do not resuscitate: Secondary | ICD-10-CM | POA: Diagnosis not present

## 2019-07-03 DIAGNOSIS — Z9049 Acquired absence of other specified parts of digestive tract: Secondary | ICD-10-CM | POA: Diagnosis not present

## 2019-07-03 DIAGNOSIS — M545 Low back pain: Secondary | ICD-10-CM | POA: Diagnosis not present

## 2019-07-03 DIAGNOSIS — E785 Hyperlipidemia, unspecified: Secondary | ICD-10-CM | POA: Diagnosis present

## 2019-07-03 DIAGNOSIS — E876 Hypokalemia: Secondary | ICD-10-CM | POA: Diagnosis present

## 2019-07-03 DIAGNOSIS — F329 Major depressive disorder, single episode, unspecified: Secondary | ICD-10-CM | POA: Diagnosis present

## 2019-07-03 DIAGNOSIS — J189 Pneumonia, unspecified organism: Principal | ICD-10-CM | POA: Diagnosis present

## 2019-07-03 DIAGNOSIS — Z807 Family history of other malignant neoplasms of lymphoid, hematopoietic and related tissues: Secondary | ICD-10-CM

## 2019-07-03 DIAGNOSIS — I2510 Atherosclerotic heart disease of native coronary artery without angina pectoris: Secondary | ICD-10-CM | POA: Diagnosis present

## 2019-07-03 DIAGNOSIS — Z79891 Long term (current) use of opiate analgesic: Secondary | ICD-10-CM

## 2019-07-03 DIAGNOSIS — E7800 Pure hypercholesterolemia, unspecified: Secondary | ICD-10-CM | POA: Diagnosis present

## 2019-07-03 DIAGNOSIS — Z7952 Long term (current) use of systemic steroids: Secondary | ICD-10-CM

## 2019-07-03 DIAGNOSIS — Z823 Family history of stroke: Secondary | ICD-10-CM

## 2019-07-03 DIAGNOSIS — J439 Emphysema, unspecified: Secondary | ICD-10-CM | POA: Diagnosis present

## 2019-07-03 DIAGNOSIS — D630 Anemia in neoplastic disease: Secondary | ICD-10-CM | POA: Diagnosis present

## 2019-07-03 DIAGNOSIS — D649 Anemia, unspecified: Secondary | ICD-10-CM | POA: Diagnosis present

## 2019-07-03 DIAGNOSIS — J9621 Acute and chronic respiratory failure with hypoxia: Secondary | ICD-10-CM | POA: Diagnosis not present

## 2019-07-03 DIAGNOSIS — Z7951 Long term (current) use of inhaled steroids: Secondary | ICD-10-CM

## 2019-07-03 DIAGNOSIS — C3491 Malignant neoplasm of unspecified part of right bronchus or lung: Secondary | ICD-10-CM | POA: Diagnosis present

## 2019-07-03 DIAGNOSIS — Z9842 Cataract extraction status, left eye: Secondary | ICD-10-CM

## 2019-07-03 DIAGNOSIS — F419 Anxiety disorder, unspecified: Secondary | ICD-10-CM | POA: Diagnosis present

## 2019-07-03 DIAGNOSIS — R918 Other nonspecific abnormal finding of lung field: Secondary | ICD-10-CM | POA: Diagnosis not present

## 2019-07-03 DIAGNOSIS — I5032 Chronic diastolic (congestive) heart failure: Secondary | ICD-10-CM | POA: Diagnosis present

## 2019-07-03 DIAGNOSIS — Z885 Allergy status to narcotic agent status: Secondary | ICD-10-CM

## 2019-07-03 DIAGNOSIS — Z9981 Dependence on supplemental oxygen: Secondary | ICD-10-CM

## 2019-07-03 DIAGNOSIS — Z9841 Cataract extraction status, right eye: Secondary | ICD-10-CM | POA: Diagnosis not present

## 2019-07-03 DIAGNOSIS — F17210 Nicotine dependence, cigarettes, uncomplicated: Secondary | ICD-10-CM | POA: Diagnosis present

## 2019-07-03 DIAGNOSIS — Z8349 Family history of other endocrine, nutritional and metabolic diseases: Secondary | ICD-10-CM

## 2019-07-03 DIAGNOSIS — Y95 Nosocomial condition: Secondary | ICD-10-CM | POA: Diagnosis present

## 2019-07-03 DIAGNOSIS — Z803 Family history of malignant neoplasm of breast: Secondary | ICD-10-CM

## 2019-07-03 DIAGNOSIS — Z20828 Contact with and (suspected) exposure to other viral communicable diseases: Secondary | ICD-10-CM | POA: Diagnosis present

## 2019-07-03 DIAGNOSIS — R0902 Hypoxemia: Secondary | ICD-10-CM | POA: Diagnosis not present

## 2019-07-03 DIAGNOSIS — I4892 Unspecified atrial flutter: Secondary | ICD-10-CM | POA: Diagnosis present

## 2019-07-03 DIAGNOSIS — Z8249 Family history of ischemic heart disease and other diseases of the circulatory system: Secondary | ICD-10-CM

## 2019-07-03 DIAGNOSIS — Z800 Family history of malignant neoplasm of digestive organs: Secondary | ICD-10-CM

## 2019-07-03 DIAGNOSIS — M810 Age-related osteoporosis without current pathological fracture: Secondary | ICD-10-CM | POA: Diagnosis present

## 2019-07-03 DIAGNOSIS — Z79899 Other long term (current) drug therapy: Secondary | ICD-10-CM

## 2019-07-03 DIAGNOSIS — J90 Pleural effusion, not elsewhere classified: Secondary | ICD-10-CM | POA: Diagnosis not present

## 2019-07-03 DIAGNOSIS — G8929 Other chronic pain: Secondary | ICD-10-CM | POA: Diagnosis present

## 2019-07-03 DIAGNOSIS — Z86010 Personal history of colonic polyps: Secondary | ICD-10-CM

## 2019-07-03 DIAGNOSIS — Z902 Acquired absence of lung [part of]: Secondary | ICD-10-CM

## 2019-07-03 DIAGNOSIS — Z85828 Personal history of other malignant neoplasm of skin: Secondary | ICD-10-CM

## 2019-07-03 DIAGNOSIS — Z9221 Personal history of antineoplastic chemotherapy: Secondary | ICD-10-CM

## 2019-07-03 DIAGNOSIS — J984 Other disorders of lung: Secondary | ICD-10-CM | POA: Diagnosis not present

## 2019-07-03 DIAGNOSIS — I959 Hypotension, unspecified: Secondary | ICD-10-CM | POA: Diagnosis not present

## 2019-07-03 DIAGNOSIS — K219 Gastro-esophageal reflux disease without esophagitis: Secondary | ICD-10-CM | POA: Diagnosis not present

## 2019-07-03 DIAGNOSIS — J449 Chronic obstructive pulmonary disease, unspecified: Secondary | ICD-10-CM | POA: Diagnosis present

## 2019-07-03 DIAGNOSIS — R531 Weakness: Secondary | ICD-10-CM | POA: Diagnosis not present

## 2019-07-03 DIAGNOSIS — K5900 Constipation, unspecified: Secondary | ICD-10-CM | POA: Diagnosis present

## 2019-07-03 LAB — COMPREHENSIVE METABOLIC PANEL
ALT: 36 U/L (ref 0–44)
AST: 24 U/L (ref 15–41)
Albumin: 3.2 g/dL — ABNORMAL LOW (ref 3.5–5.0)
Alkaline Phosphatase: 103 U/L (ref 38–126)
Anion gap: 11 (ref 5–15)
BUN: 25 mg/dL — ABNORMAL HIGH (ref 8–23)
CO2: 38 mmol/L — ABNORMAL HIGH (ref 22–32)
Calcium: 9 mg/dL (ref 8.9–10.3)
Chloride: 88 mmol/L — ABNORMAL LOW (ref 98–111)
Creatinine, Ser: 0.82 mg/dL (ref 0.44–1.00)
GFR calc Af Amer: >60 mL/min (ref >60)
GFR calc non Af Amer: >60 mL/min (ref >60)
Glucose, Bld: 143 mg/dL — ABNORMAL HIGH (ref 70–99)
Potassium: 4.2 mmol/L (ref 3.5–5.1)
Sodium: 137 mmol/L (ref 135–145)
Total Bilirubin: 0.7 mg/dL (ref 0.3–1.2)
Total Protein: 6 g/dL — ABNORMAL LOW (ref 6.5–8.1)

## 2019-07-03 LAB — MAGNESIUM: Magnesium: 2.2 mg/dL (ref 1.7–2.4)

## 2019-07-03 LAB — CBC WITH DIFFERENTIAL/PLATELET
Abs Immature Granulocytes: 0.33 10*3/uL — ABNORMAL HIGH (ref 0.00–0.07)
Basophils Absolute: 0.1 10*3/uL (ref 0.0–0.1)
Basophils Relative: 0 %
Eosinophils Absolute: 0.2 10*3/uL (ref 0.0–0.5)
Eosinophils Relative: 1 %
HCT: 28.4 % — ABNORMAL LOW (ref 36.0–46.0)
Hemoglobin: 9 g/dL — ABNORMAL LOW (ref 12.0–15.0)
Immature Granulocytes: 2 %
Lymphocytes Relative: 11 %
Lymphs Abs: 2.5 10*3/uL (ref 0.7–4.0)
MCH: 30.1 pg (ref 26.0–34.0)
MCHC: 31.7 g/dL (ref 30.0–36.0)
MCV: 95 fL (ref 80.0–100.0)
Monocytes Absolute: 1.5 10*3/uL — ABNORMAL HIGH (ref 0.1–1.0)
Monocytes Relative: 7 %
Neutro Abs: 17 10*3/uL — ABNORMAL HIGH (ref 1.7–7.7)
Neutrophils Relative %: 79 %
Platelets: 263 10*3/uL (ref 150–400)
RBC: 2.99 MIL/uL — ABNORMAL LOW (ref 3.87–5.11)
RDW: 21.8 % — ABNORMAL HIGH (ref 11.5–15.5)
WBC: 21.5 10*3/uL — ABNORMAL HIGH (ref 4.0–10.5)
nRBC: 0 % (ref 0.0–0.2)

## 2019-07-03 LAB — LACTIC ACID, PLASMA: Lactic Acid, Venous: 1.9 mmol/L (ref 0.5–1.9)

## 2019-07-03 LAB — BRAIN NATRIURETIC PEPTIDE: B Natriuretic Peptide: 68.6 pg/mL (ref 0.0–100.0)

## 2019-07-03 LAB — SARS CORONAVIRUS 2 BY RT PCR (HOSPITAL ORDER, PERFORMED IN ~~LOC~~ HOSPITAL LAB): SARS Coronavirus 2: NEGATIVE

## 2019-07-03 LAB — LIPASE, BLOOD: Lipase: 19 U/L (ref 11–51)

## 2019-07-03 LAB — CBG MONITORING, ED: Glucose-Capillary: 161 mg/dL — ABNORMAL HIGH (ref 70–99)

## 2019-07-03 MED ORDER — VANCOMYCIN HCL 10 G IV SOLR
1250.0000 mg | Freq: Once | INTRAVENOUS | Status: AC
  Administered 2019-07-03: 1250 mg via INTRAVENOUS
  Filled 2019-07-03: qty 1250

## 2019-07-03 MED ORDER — VANCOMYCIN HCL IN DEXTROSE 1-5 GM/200ML-% IV SOLN: 1000.0000 mg | INTRAVENOUS | Status: DC

## 2019-07-03 MED ORDER — SODIUM CHLORIDE 0.9 % IV BOLUS
250.0000 mL | Freq: Once | INTRAVENOUS | Status: AC
  Administered 2019-07-03: 250 mL via INTRAVENOUS

## 2019-07-03 MED ORDER — ENOXAPARIN SODIUM 40 MG/0.4ML ~~LOC~~ SOLN
40.0000 mg | SUBCUTANEOUS | Status: DC
  Administered 2019-07-04 – 2019-07-05 (×3): 40 mg via SUBCUTANEOUS
  Filled 2019-07-03 (×4): qty 0.4

## 2019-07-03 MED ORDER — SODIUM CHLORIDE 0.9 % IV SOLN
2.0000 g | Freq: Once | INTRAVENOUS | Status: AC
  Administered 2019-07-03: 2 g via INTRAVENOUS
  Filled 2019-07-03: qty 2

## 2019-07-03 MED ORDER — SODIUM CHLORIDE 0.9 % IV SOLN
2.0000 g | Freq: Two times a day (BID) | INTRAVENOUS | Status: DC
  Administered 2019-07-04 – 2019-07-06 (×5): 2 g via INTRAVENOUS
  Filled 2019-07-03 (×6): qty 2

## 2019-07-03 MED ORDER — SODIUM CHLORIDE 0.9 % IV SOLN: 1.0000 g | Freq: Once | INTRAVENOUS | Status: DC

## 2019-07-03 NOTE — H&P (Signed)
TRH H&P    Patient Demographics:    Tonya Bradley, is a 68 y.o. female  MRN: [id number]  DOB - [birth year]  Admit Date - 07/03/2019  Referring MD/NP/PA:  Wyn Quaker  Outpatient Primary MD for the patient is [place], Tonya So, MD Curt Bears - oncology  Patient coming from: home  Chief complaint- diarrhea, hypoxia   HPI:    Tonya Bradley  is a 68 y.o. female,   CHF (diastolic), Aflutter/ fib, CAD, Copd on home o2, small cell lung cancer, Tobacco abuse,  w recent admission for acute on chronic hypoxic respiratory failure presents with c/o worsening o2 saturation needing 5L o2 [place] instead of 4L o2 [place] as well as cough.  Pt notes diarrhea today x2 and generalized weakness but pt states took magnesium citrate yesterday.  Pt denies fever, chills, cp, palp, n/v, abd pain, brbpr, black stool dysuria, hematuria.   In ED,  T 98.1, P 85 R 16 Bp 103/62  Pox 96% RA  CXR IMPRESSION: Area of ill-defined opacity lateral right base, likely pneumonia. Small right pleural effusion. Postoperative change with volume loss and scarring on the right. Left lung clear. Stable cardiac silhouette. No adenopathy evident. Aortic Atherosclerosis (ICD10-I70.0).  Followup PA and lateral chest radiographs recommended in 3-4 weeks following trial of antibiotic therapy to ensure resolution and exclude underlying malignancy.  Wbc 21.5, Hgb 9.0 Plt 263 BNP 68.6 Lactic acid 1.9 Blood culture x2 pending  covid pending   Na 137, K 4.2,  Magnesium 2.2 Bun 25, Creatinine 0.82 Ast 24, Alt 36 Lipase 19  Pt will be admitted for RLL Hcap     Review of systems:    In addition to the HPI above,  No Fever-chills, No Headache, No changes with Vision or hearing, No problems swallowing food or Liquids, No Chest pain,  No Abdominal pain, No Nausea or Vomiting  No Blood in stool or Urine, No dysuria, No new skin rashes or  bruises, No new joints pains-aches,  No new weakness, tingling, numbness in any extremity, No recent weight gain or loss, No polyuria, polydypsia or polyphagia, No significant Mental Stressors.  All other systems reviewed and are negative.    Past History of the following :    Past Medical History:  Diagnosis Date  . Acute on chronic diastolic (congestive) heart failure ([place]) 06/12/2019  . AKI (acute kidney injury) ([place])   . Anemia   . Anxiety   . Arthritis    "back"  . Atrial flutter ([place]) 03/13/2016  . CAD (coronary artery disease)   . Cancer ([place])    skin cancer  . Cataract   . Colon polyp   . Complication of anesthesia    BP dropped during esophagus diltation in 2017-admitted for 3 days.   Marland Kitchen COPD (chronic obstructive pulmonary disease) ([place])   . Depression   . Dysrhythmia   . Emphysema of lung ([place])   . GERD (gastroesophageal reflux disease)   . Hypercholesterolemia   . Lung cancer ([place])   . Osteoporosis   . Oxygen  deficiency       Past Surgical History:  Procedure Laterality Date  . A-FLUTTER ABLATION N/A 05/19/2017   Procedure: A-Flutter Ablation;  Surgeon: Evans Lance, MD;  Location: [facility];  Service: Cardiovascular;  Laterality: N/A;  . APPENDECTOMY    . BASAL CELL CARCINOMA EXCISION    . CARDIAC CATHETERIZATION    . CATARACT EXTRACTION Bilateral   . CHOLECYSTECTOMY    . COLONOSCOPY N/A 06/23/2014   Dr. Rourk:Rectal and colonic polyps-removed, Pancolonic diverticulosis. lymphocytic colitis  . CORONARY ANGIOPLASTY WITH STENT PLACEMENT    . ESOPHAGEAL DILATION N/A 03/13/2016   Procedure: ESOPHAGEAL DILATION;  Surgeon: Daneil Dolin, MD;  [facility];  Service: Endoscopy;  Laterality: N/A;  . ESOPHAGOGASTRODUODENOSCOPY N/A 03/13/2016   Procedure: ESOPHAGOGASTRODUODENOSCOPY (EGD);  Surgeon: Daneil Dolin, MD;  [facility];  Service: Endoscopy;  Laterality: N/A;  0930  . EYE SURGERY     cataract removed, bilateral  . IR  THORACENTESIS ASP PLEURAL SPACE W/IMG GUIDE  02/15/2019  . LUMBAR LAMINECTOMY/DECOMPRESSION MICRODISCECTOMY N/A 01/19/2018   Procedure: L4-5 DECOMPRESSION;  Surgeon: Marybelle Killings, MD;  Location: [facility];  Service: Orthopedics;  Laterality: N/A;  . VIDEO ASSISTED THORACOSCOPY (VATS)/ LOBECTOMY Right 02/04/2019   Procedure: VIDEO ASSISTED THORACOSCOPY (VATS)/RIGHT UPPER AND MIDDLE LOBECTOMY;  Surgeon: Melrose Nakayama, MD;  Location: [facility];  Service: Thoracic;  Laterality: Right;  Marland Kitchen VIDEO BRONCHOSCOPY N/A 02/14/2019   Procedure: BRONCHOSCOPY WITH SEDATION;  Surgeon: Melrose Nakayama, MD;  Location: [facility];  Service: Thoracic;  Laterality: N/A;      Social History:      Social History   Tobacco Use  . Smoking status: Current Every Day Smoker    Packs/day: 0.25    Years: 54.00    Pack years: 13.50    Types: Cigarettes    Start date: 06/14/1966  . Smokeless tobacco: Never Used  . Tobacco comment: 1-2 packs daily. Chantix too expensive; pt states that she quit 2 weeks ago (02/04/19)   Substance Use Topics  . Alcohol use: No    Alcohol/week: 0.0 standard drinks       Family History :     Family History  Problem Relation Age of Onset  . Breast cancer Sister 75  . Heart disease Sister   . Heart attack Father   . Heart disease Father   . Stroke Mother   . Heart disease Mother   . Hyperlipidemia Mother   . Heart attack Sister   . Heart disease Brother        ?heart failure  . Congestive Heart Failure Brother   . Hypertension Son   . Heart disease Maternal Grandfather   . Cancer Cousin        lymphoma  . Hypertension Son   . Stomach cancer Other   . Colon cancer Neg Hx   . Pancreatic cancer Neg Hx       Home Medications:   Prior to Admission medications   Medication Sig Start Date End Date Taking? Authorizing Provider  ALPRAZolam Duanne Moron) 0.5 MG tablet Take 1 tablet (0.5 mg total) by mouth 2 (two) times daily. 04/19/19   [place], [name], MD  atorvastatin (LIPITOR) 80  MG tablet TAKE 1 TABLET(80 MG) BY MOUTH DAILY 04/28/19   [place], [name], MD  Cholecalciferol (VITAMIN D) 2000 units CAPS Take 2,000 Units by mouth daily.    [provider]  cycloSPORINE (RESTASIS) 0.05 % ophthalmic emulsion Place 1  drop into both eyes 2 (two) times daily.    [provider]  Dexlansoprazole (DEXILANT) 30 MG capsule Take 1 capsule (30 mg total) by mouth daily. 02/19/19   [place], [name], MD  diltiazem (CARDIZEM CD) 120 MG 24 hr capsule TAKE 1 CAPSULE(120 MG) BY MOUTH DAILY 01/08/19   [place], [name], MD  DULoxetine (CYMBALTA) 60 MG capsule Take 1 capsule (60 mg total) by mouth daily. 10/06/18   [place], [name], MD  Fluticasone-Umeclidin-Vilant (TRELEGY ELLIPTA) 100-62.5-25 MCG/INH AEPB Inhale 1 puff into the lungs daily. 06/29/19   [place], [name], MD  HYDROcodone-acetaminophen (NORCO/VICODIN) 5-325 MG tablet Take 1 tablet by mouth every 6 (six) hours as needed for moderate pain. 06/29/19   [place], [name], MD  ipratropium-albuterol (DUONEB) 0.5-2.5 (3) MG/3ML SOLN Take 3 mLs by nebulization every 4 (four) hours as needed (shortness of breath or wheezing). 06/19/19   Donne Hazel, MD  loratadine (CLARITIN) 10 MG tablet Take 10 mg by mouth daily.    [provider]  magnesium oxide (MAG-OX) 400 (241.3 Mg) MG tablet Take 1 tablet (400 mg total) by mouth 2 (two) times daily. 06/29/19   Heilingoetter, Cassandra L, PA-C  metoCLOPramide (REGLAN) 5 MG tablet Take 1 tablet (5 mg total) by mouth 3 (three) times daily before meals. 05/11/19   Melrose Nakayama, MD  metoprolol tartrate (LOPRESSOR) 25 MG tablet Take 0.5 tablets (12.5 mg total) by mouth 2 (two) times daily. 06/29/19   [place], [name], MD  Multiple Vitamin (MULTIVITAMIN WITH MINERALS) TABS tablet Take 1 tablet by mouth daily.    [provider]  ondansetron (ZOFRAN) 4 MG tablet TAKE 1 TABLET(4 MG) BY MOUTH TWICE DAILY AS NEEDED FOR NAUSEA OR VOMITING 05/11/19   [place], [name], MD  potassium chloride  (K-DUR) 10 MEQ tablet Take 1 tablet (10 mEq total) by mouth 2 (two) times daily. 05/27/19   Evans Lance, MD  predniSONE (DELTASONE) 10 MG tablet Taper dose: 60mg  po daily x 3 days, then 40mg  po daily x 3 days, then 20mg  po daily x 3 days, then 10mg  po daily x 3 days, then 5mg  po daily x 2 days, then stop. Zero refills 06/19/19   Donne Hazel, MD  prochlorperazine (COMPAZINE) 10 MG tablet TAKE 1 TABLET(10 MG) BY MOUTH EVERY 6 HOURS AS NEEDED FOR NAUSEA OR VOMITING 05/10/19   Curt Bears, MD  QUEtiapine (SEROQUEL) 100 MG tablet Take 1.5 tablets (150 mg total) by mouth at bedtime. 06/29/19   [place], [name], MD  rOPINIRole (REQUIP) 0.25 MG tablet Take 2 tablets (0.5 mg total) by mouth at bedtime. 06/28/19   [place], [name], MD  torsemide (DEMADEX) 20 MG tablet Take 1 tablet (20 mg total) by mouth as needed. 07/01/19   Rosita Fire, Brittainy M, PA-C  VENTOLIN HFA 108 (90 Base) MCG/ACT inhaler INHALE 2 PUFFS INTO THE LUNGS EVERY 6 HOURS AS NEEDED FOR WHEEZING OR SHORTNESS OF BREATH 09/19/17   [place], [name], MD     Allergies:     Allergies  Allergen Reactions  . Morphine And Related Other (See Comments)    hallucinations     Physical Exam:   Vitals  Blood pressure (!) 101/57, pulse 84, temperature 98.1 F (36.7 C), temperature source Oral, resp. rate (!) 23, SpO2 96 %.  1.  General: axoxo3  2. Psychiatric: euthymic  3. Neurologic: cn2-12 intact, reflexes 2+ symmetric, diffuse with no clonus motor 5/5 in all 4 ext  4. HEENMT:  Anicteric, pale conjunctiva, pupils 1.9mm symmetric, direct, consensual, near intact Neck: no jvd  5. Respiratory : Decrease in bs at right lung base, trace crackles, no wheezing  6. Cardiovascular : rrr s1, s2, no m/g/r  7. Gastrointestinal:  Abd: soft, nt, nd, +bs  8. Skin:  Ext: no c/c/e,  No rash  9.Musculoskeletal:  Good ROM    Data Review:    CBC Recent Labs  Lab 06/29/19 1431 07/03/19 1727  WBC 33.1* 21.5*  HGB 10.8* 9.0*  HCT  33.0* 28.4*  PLT 280 263  MCV 90.2 95.0  MCH 29.5 30.1  MCHC 32.7 31.7  RDW 20.2* 21.8*  LYMPHSABS 3.8 2.5  MONOABS 1.8* 1.5*  EOSABS 0.1 0.2  BASOSABS 0.0 0.1   ------------------------------------------------------------------------------------------------------------------  Results for orders placed or performed during the hospital encounter of 07/03/19 (from the past 48 hour(s))  CBG monitoring, ED     Status: Abnormal   Collection Time: 07/03/19  4:47 PM  Result Value Ref Range   Glucose-Capillary 161 (H) 70 - 99 mg/dL  Comprehensive metabolic panel     Status: Abnormal   Collection Time: 07/03/19  5:27 PM  Result Value Ref Range   Sodium 137 135 - 145 mmol/L   Potassium 4.2 3.5 - 5.1 mmol/L   Chloride 88 (L) 98 - 111 mmol/L   CO2 38 (H) 22 - 32 mmol/L   Glucose, Bld 143 (H) 70 - 99 mg/dL   BUN 25 (H) 8 - 23 mg/dL   Creatinine, Ser 0.82 0.44 - 1.00 mg/dL   Calcium 9.0 8.9 - 10.3 mg/dL   Total Protein 6.0 (L) 6.5 - 8.1 g/dL   Albumin 3.2 (L) 3.5 - 5.0 g/dL   AST 24 15 - 41 U/L   ALT 36 0 - 44 U/L   Alkaline Phosphatase 103 38 - 126 U/L   Total Bilirubin 0.7 0.3 - 1.2 mg/dL   GFR calc non Af Amer >60 >60 mL/min   GFR calc Af Amer >60 >60 mL/min   Anion gap 11 5 - 15    Comment: Performed at [facility], [place] [address] [place], [place] [address]  Lipase, blood     Status: None   Collection Time: 07/03/19  5:27 PM  Result Value Ref Range   Lipase 19 11 - 51 U/L    Comment: Performed at [facility], [place] [address], [place], [place] [address]  Brain natriuretic peptide     Status: None   Collection Time: 07/03/19  5:27 PM  Result Value Ref Range   B Natriuretic Peptide 68.6 0.0 - 100.0 pg/mL    Comment: Performed at [facility], [place] [address] [place], [place] [address]  Magnesium     Status: None   Collection Time: 07/03/19  5:27 PM  Result Value Ref Range   Magnesium 2.2 1.7 - 2.4 mg/dL    Comment:  Performed at [facility], [place] [address], [place], [place] [address]  CBC with Differential     Status: Abnormal   Collection Time: 07/03/19  5:27 PM  Result Value Ref Range   WBC 21.5 (H) 4.0 - 10.5 K/uL   RBC 2.99 (L) 3.87 - 5.11 MIL/uL   Hemoglobin 9.0 (L) 12.0 - 15.0 g/dL   HCT 28.4 (L) 36.0 - 46.0 %   MCV 95.0 80.0 - 100.0 fL   MCH 30.1 26.0 - 34.0 pg   MCHC 31.7 30.0 - 36.0 g/dL   RDW  21.8 (H) 11.5 - 15.5 %   Platelets 263 150 - 400 K/uL   nRBC 0.0 0.0 - 0.2 %   Neutrophils Relative % 79 %   Neutro Abs 17.0 (H) 1.7 - 7.7 K/uL   Lymphocytes Relative 11 %   Lymphs Abs 2.5 0.7 - 4.0 K/uL   Monocytes Relative 7 %   Monocytes Absolute 1.5 (H) 0.1 - 1.0 K/uL   Eosinophils Relative 1 %   Eosinophils Absolute 0.2 0.0 - 0.5 K/uL   Basophils Relative 0 %   Basophils Absolute 0.1 0.0 - 0.1 K/uL   Immature Granulocytes 2 %   Abs Immature Granulocytes 0.33 (H) 0.00 - 0.07 K/uL   Polychromasia PRESENT    Ovalocytes PRESENT     Comment: Performed at [facility], [place] [address], [place], [place] [address]  Lactic acid, plasma     Status: None   Collection Time: 07/03/19  5:28 PM  Result Value Ref Range   Lactic Acid, Venous 1.9 0.5 - 1.9 mmol/L    Comment: Performed at [facility], [place] [address] [place],  [address]    Chemistries  Recent Labs  Lab 06/29/19 1431 07/03/19 1727  NA 137 137  K 4.5 4.2  CL 89* 88*  CO2 34* 38*  GLUCOSE 132* 143*  BUN 28* 25*  CREATININE 0.80 0.82  CALCIUM 9.7 9.0  MG 1.4* 2.2  AST 21 24  ALT 26 36  ALKPHOS 108 103  BILITOT 0.2* 0.7   ------------------------------------------------------------------------------------------------------------------  ------------------------------------------------------------------------------------------------------------------ GFR: Estimated Creatinine Clearance: 56.1 mL/min (by C-G formula based on SCr of 0.82 mg/dL). Liver Function  Tests: Recent Labs  Lab 06/29/19 1431 07/03/19 1727  AST 21 24  ALT 26 36  ALKPHOS 108 103  BILITOT 0.2* 0.7  PROT 6.7 6.0*  ALBUMIN 3.7 3.2*   Recent Labs  Lab 07/03/19 1727  LIPASE 19   No results for input(s): AMMONIA in the last 168 hours. Coagulation Profile: No results for input(s): INR, PROTIME in the last 168 hours. Cardiac Enzymes: No results for input(s): CKTOTAL, CKMB, CKMBINDEX, TROPONINI in the last 168 hours. BNP (last 3 results) No results for input(s): PROBNP in the last 8760 hours. HbA1C: No results for input(s): HGBA1C in the last 72 hours. CBG: Recent Labs  Lab 07/03/19 1647  GLUCAP 161*   Lipid Profile: No results for input(s): CHOL, HDL, LDLCALC, TRIG, CHOLHDL, LDLDIRECT in the last 72 hours. Thyroid Function Tests: No results for input(s): TSH, T4TOTAL, FREET4, T3FREE, THYROIDAB in the last 72 hours. Anemia Panel: No results for input(s): VITAMINB12, FOLATE, FERRITIN, TIBC, IRON, RETICCTPCT in the last 72 hours.  --------------------------------------------------------------------------------------------------------------- Urine analysis:    Component Value Date/Time   COLORURINE YELLOW 06/17/2019 1741   APPEARANCEUR CLEAR 06/17/2019 1741   LABSPEC 1.011 06/17/2019 1741   PHURINE 7.0 06/17/2019 1741   GLUCOSEU NEGATIVE 06/17/2019 1741   GLUCOSEU NEGATIVE 08/04/2018 1721   HGBUR NEGATIVE 06/17/2019 1741   BILIRUBINUR NEGATIVE 06/17/2019 1741   KETONESUR 20 (A) 06/17/2019 1741   PROTEINUR NEGATIVE 06/17/2019 1741   UROBILINOGEN 0.2 08/04/2018 1721   NITRITE NEGATIVE 06/17/2019 1741   LEUKOCYTESUR NEGATIVE 06/17/2019 1741      Imaging Results:    Dg Lumbar Spine Complete  Result Date: 07/03/2019 CLINICAL DATA:  Low back pain EXAM: LUMBAR SPINE - COMPLETE 4+ VIEW COMPARISON:  January 27, 2018 FINDINGS: There is no acute displaced fracture. There are multilevel degenerative changes throughout the lumbar spine, greatest at the lower  lumbar segments. There is multilevel facet arthrosis. Vascular calcifications are noted. IMPRESSION: No acute osseous abnormality.  Degenerative changes as above. Electronically Signed   By: Constance Holster M.D.   On: 07/03/2019 18:57   Dg Chest Port 1 View  Result Date: 07/03/2019 CLINICAL DATA:  Shortness of breath EXAM: PORTABLE CHEST 1 VIEW COMPARISON:  June 12, 2019 FINDINGS: Postoperative changes noted on the right with volume loss. There is ill-defined opacity in the lateral right base with small right pleural effusion. Left lung is clear. Heart size and pulmonary vascularity are normal. No adenopathy evident. There is aortic atherosclerosis. No bone lesions. IMPRESSION: Area of ill-defined opacity lateral right base, likely pneumonia. Small right pleural effusion. Postoperative change with volume loss and scarring on the right. Left lung clear. Stable cardiac silhouette. No adenopathy evident. Aortic Atherosclerosis (ICD10-I70.0). Followup PA and lateral chest radiographs recommended in 3-4 weeks following trial of antibiotic therapy to ensure resolution and exclude underlying malignancy. Electronically Signed   By: Lowella Grip III M.D.   On: 07/03/2019 18:27   ekg nsr at 66, LAD, nl int, no st-t changes c/w ischemia   Assessment & Plan:    Principal Problem:   HCAP (healthcare-associated pneumonia) Active Problems:   Atrial flutter (HCC)   CAD (coronary artery disease)   COPD (chronic obstructive pulmonary disease) (HCC)   Diarrhea   Anemia  Hcap Blood culture x2 Urine strep antigen Urine legionella antigen vanco iv, cefepime iv pharmacy to dose  Diarrhea likely due to magnesium citrate C. Diff ordered by ED, pending  Copd on home o2 Cont trelegy DC duoneb Albuterol HFA 2puff q6h prn   Leukocytosis secondary to Hcap, prednisone Check cbc in am  Anemia Check cbc in am  Aflutter s/p Aflutter ablation in 2018 and anticoagulation discontinued at that time per  cardiology note 07/01/2019 Cont Cardizem CD 120mg  po qday Cont Metoprolol 12.5mg  po bid Assume not on anticoagulation due to falls  H/o Chronic Diastolic CHF  Cont Torsemide 20mg  po qday Cont Metoprolol as above  Gerd/ nausea Cont PPI Cont Reglan 10mg  po tid Cont Compazine 10mg  po q6h prn  Depression/ Anxiety Cont Cymbalta Cont Seroquel Cont Xanax 0.5mg  po bid  Small Cell Lung CA  End of Life DNR (yellow ticket) Palliative care has been following  Chronic back pain Cont Norco  DVT Prophylaxis-   Lovenox - SCDs   AM Labs Ordered, also please review Full Orders  Family Communication: Admission, patients condition and plan of care including tests being ordered have been discussed with the patient and husband who indicate understanding and agree with the plan and Code Status.  Code Status:  DNR, spoke with husband and notified him of admission to [facility]   Admission status:    Inpatient: Based on patients clinical presentation and evaluation of above clinical data, I have made determination that patient meets Inpatient criteria at this time.   Pt has high risk of clinical deterioration,  Pt will require iv abx for Hcap, and > 2 nites stay for iv abx.    Time spent in minutes : 70   Jani Gravel M.D on 07/03/2019 at 8:10 PM

## 2019-07-03 NOTE — ED Notes (Signed)
ED TO INPATIENT HANDOFF REPORT  Name/Age/Gender Tonya Bradley 68 y.o. female  Code Status    Code Status Orders  (From admission, onward)         Start     Ordered   07/03/19 2026  Do not attempt resuscitation (DNR)  Continuous    Question Answer Comment  In the event of cardiac or respiratory ARREST Do not call a "code blue"   In the event of cardiac or respiratory ARREST Do not perform Intubation, CPR, defibrillation or ACLS   In the event of cardiac or respiratory ARREST Use medication by any route, position, wound care, and other measures to relive pain and suffering. May use oxygen, suction and manual treatment of airway obstruction as needed for comfort.      07/03/19 2026        Code Status History    Date Active Date Inactive Code Status Order ID Comments User Context   06/12/2019 1554 06/20/2019 0101 DNR [id number]  Alma Friendly, MD ED   06/12/2019 1119 06/12/2019 1554 DNR [id number]  Carmin Muskrat, MD ED   02/04/2019 1556 02/18/2019 1528 Full Code [id number]  Antony Odea, PA-C Inpatient   01/02/2019 0303 01/03/2019 2346 DNR [id number]  Toy Baker, MD ED   01/19/2018 1557 01/20/2018 1537 Full Code [id number]  Lanae Crumbly, PA-C Inpatient   05/19/2017 1003 05/19/2017 1925 Full Code [id number]  Evans Lance, MD Inpatient   03/13/2016 1748 03/15/2016 1743 Full Code [id number]  Samuella Cota, MD Inpatient   Advance Care Planning Activity    Advance Directive Documentation     Most Recent Value  Type of Advance Directive  Healthcare Power of Attorney, Living will, Out of facility DNR (pink MOST or yellow form)  Pre-existing out of facility DNR order (yellow form or pink MOST form)  -  "MOST" Form in Place?  -      Home/SNF/Other Home  Chief Complaint Generalized Weakness  Level of Care/Admitting Diagnosis ED Disposition    ED Disposition Condition [place]: [facility] [[id number]]  Level of Care:  Telemetry [5]  Admit to tele based on following criteria: Monitor for Ischemic changes  Covid Evaluation: Person Under Investigation (PUI)  Diagnosis: HCAP (healthcare-associated pneumonia) [[id number]]  Admitting Physician: Jani Gravel [[id number]]  Attending Physician: Jani Gravel [phone]  Estimated length of stay: past midnight tomorrow  Certification:: I certify this patient will need inpatient services for at least 2 midnights  PT Class (Do Not Modify): Inpatient [101]  PT Acc Code (Do Not Modify): Private [1]       Medical History Past Medical History:  Diagnosis Date  . Acute on chronic diastolic (congestive) heart failure ([place]) 06/12/2019  . AKI (acute kidney injury) ([place])   . Anemia   . Anxiety   . Arthritis    "back"  . Atrial flutter ([place]) 03/13/2016  . CAD (coronary artery disease)   . Cancer ([place])    skin cancer  . Cataract   . Colon polyp   . Complication of anesthesia    BP dropped during esophagus diltation in 2017-admitted for 3 days.   Marland Kitchen COPD (chronic obstructive pulmonary disease) ([place])   . Depression   . Dysrhythmia   . Emphysema of lung ([place])   . GERD (gastroesophageal reflux disease)   . Hypercholesterolemia   . Lung cancer ([place])   . Osteoporosis   . Oxygen deficiency     Allergies Allergies  Allergen  Reactions  . Morphine And Related Other (See Comments)    hallucinations    IV Location/Drains/Wounds Patient Lines/Drains/Airways Status   Active Line/Drains/Airways    Name:   Placement date:   Placement time:   Site:   Days:   Peripheral IV 07/03/19 Left Antecubital   07/03/19    1802    Antecubital   less than 1   Incision (Closed) 02/04/19 Chest Right   02/04/19    1225     149   Incision (Closed) 02/14/19 N/A Other (Comment)   02/14/19    1037     139          Labs/Imaging Results for orders placed or performed during the hospital encounter of 07/03/19 (from the past 48 hour(s))  CBG monitoring, ED     Status: Abnormal   Collection Time:  07/03/19  4:47 PM  Result Value Ref Range   Glucose-Capillary 161 (H) 70 - 99 mg/dL  Comprehensive metabolic panel     Status: Abnormal   Collection Time: 07/03/19  5:27 PM  Result Value Ref Range   Sodium 137 135 - 145 mmol/L   Potassium 4.2 3.5 - 5.1 mmol/L   Chloride 88 (L) 98 - 111 mmol/L   CO2 38 (H) 22 - 32 mmol/L   Glucose, Bld 143 (H) 70 - 99 mg/dL   BUN 25 (H) 8 - 23 mg/dL   Creatinine, Ser 0.82 0.44 - 1.00 mg/dL   Calcium 9.0 8.9 - 10.3 mg/dL   Total Protein 6.0 (L) 6.5 - 8.1 g/dL   Albumin 3.2 (L) 3.5 - 5.0 g/dL   AST 24 15 - 41 U/L   ALT 36 0 - 44 U/L   Alkaline Phosphatase 103 38 - 126 U/L   Total Bilirubin 0.7 0.3 - 1.2 mg/dL   GFR calc non Af Amer >60 >60 mL/min   GFR calc Af Amer >60 >60 mL/min   Anion gap 11 5 - 15    Comment: Performed at [facility], [place] [address], [place], [place] [address]  Lipase, blood     Status: None   Collection Time: 07/03/19  5:27 PM  Result Value Ref Range   Lipase 19 11 - 51 U/L    Comment: Performed at [facility], [place] [address] [place], [place] [address]  Brain natriuretic peptide     Status: None   Collection Time: 07/03/19  5:27 PM  Result Value Ref Range   B Natriuretic Peptide 68.6 0.0 - 100.0 pg/mL    Comment: Performed at [facility], [place] [address], [place], [place] [address]  Magnesium     Status: None   Collection Time: 07/03/19  5:27 PM  Result Value Ref Range   Magnesium 2.2 1.7 - 2.4 mg/dL    Comment: Performed at [facility], [place] [address], [place], [place] [address]  CBC with Differential     Status: Abnormal   Collection Time: 07/03/19  5:27 PM  Result Value Ref Range   WBC 21.5 (H) 4.0 - 10.5 K/uL   RBC 2.99 (L) 3.87 - 5.11 MIL/uL   Hemoglobin 9.0 (L) 12.0 - 15.0 g/dL   HCT 28.4 (L) 36.0 - 46.0 %   MCV 95.0 80.0 - 100.0 fL   MCH 30.1 26.0 - 34.0 pg   MCHC 31.7 30.0 - 36.0 g/dL   RDW 21.8 (H) 11.5 - 15.5 %   Platelets 263  150 - 400 K/uL  nRBC 0.0 0.0 - 0.2 %   Neutrophils Relative % 79 %   Neutro Abs 17.0 (H) 1.7 - 7.7 K/uL   Lymphocytes Relative 11 %   Lymphs Abs 2.5 0.7 - 4.0 K/uL   Monocytes Relative 7 %   Monocytes Absolute 1.5 (H) 0.1 - 1.0 K/uL   Eosinophils Relative 1 %   Eosinophils Absolute 0.2 0.0 - 0.5 K/uL   Basophils Relative 0 %   Basophils Absolute 0.1 0.0 - 0.1 K/uL   Immature Granulocytes 2 %   Abs Immature Granulocytes 0.33 (H) 0.00 - 0.07 K/uL   Polychromasia PRESENT    Ovalocytes PRESENT     Comment: Performed at [facility], [place] [address], [place], [place] [address]  Lactic acid, plasma     Status: None   Collection Time: 07/03/19  5:28 PM  Result Value Ref Range   Lactic Acid, Venous 1.9 0.5 - 1.9 mmol/L    Comment: Performed at [facility], [place] [address], [place], [place] [address]  SARS Coronavirus 2 [facility] order, Performed in [facility] hospital lab) Nasopharyngeal Nasopharyngeal Swab     Status: None   Collection Time: 07/03/19  6:48 PM   Specimen: Nasopharyngeal Swab  Result Value Ref Range   SARS Coronavirus 2 NEGATIVE NEGATIVE    Comment: (NOTE) If result is NEGATIVE SARS-CoV-2 target nucleic acids are NOT DETECTED. The SARS-CoV-2 RNA is generally detectable in upper and lower  respiratory specimens during the acute phase of infection. The lowest  concentration of SARS-CoV-2 viral copies this assay can detect is 250  copies / mL. A negative result does not preclude SARS-CoV-2 infection  and should not be used as the sole basis for treatment or other  patient management decisions.  A negative result may occur with  improper specimen collection / handling, submission of specimen other  than nasopharyngeal swab, presence of viral mutation(s) within the  areas targeted by this assay, and inadequate number of viral copies  (<250 copies / mL). A negative result must be combined with clinical  observations, patient history,  and epidemiological information. If result is POSITIVE SARS-CoV-2 target nucleic acids are DETECTED. The SARS-CoV-2 RNA is generally detectable in upper and lower  respiratory specimens dur ing the acute phase of infection.  Positive  results are indicative of active infection with SARS-CoV-2.  Clinical  correlation with patient history and other diagnostic information is  necessary to determine patient infection status.  Positive results do  not rule out bacterial infection or co-infection with other viruses. If result is PRESUMPTIVE POSTIVE SARS-CoV-2 nucleic acids MAY BE PRESENT.   A presumptive positive result was obtained on the submitted specimen  and confirmed on repeat testing.  While 2019 novel coronavirus  (SARS-CoV-2) nucleic acids may be present in the submitted sample  additional confirmatory testing may be necessary for epidemiological  and / or clinical management purposes  to differentiate between  SARS-CoV-2 and other Sarbecovirus currently known to infect humans.  If clinically indicated additional testing with an alternate test  methodology [phone]) is advised. The SARS-CoV-2 RNA is generally  detectable in upper and lower respiratory sp ecimens during the acute  phase of infection. The expected result is Negative. Fact Sheet for Patients:  StrictlyIdeas.no Fact Sheet for Healthcare Providers: BankingDealers.co.za This test is not yet approved or cleared by the [place] FDA and has been authorized for detection and/or diagnosis of SARS-CoV-2 by FDA under an Emergency Use Authorization (EUA).  This EUA  will remain in effect (meaning this test can be used) for the duration of the COVID-19 declaration under Section 564(b)(1) of the Act, 21 U.S.C. section 360bbb-3(b)(1), unless the authorization is terminated or revoked sooner. Performed at [facility], [place] [address] [place], [place]  [address]    Dg Lumbar Spine Complete  Result Date: 07/03/2019 CLINICAL DATA:  Low back pain EXAM: LUMBAR SPINE - COMPLETE 4+ VIEW COMPARISON:  January 27, 2018 FINDINGS: There is no acute displaced fracture. There are multilevel degenerative changes throughout the lumbar spine, greatest at the lower lumbar segments. There is multilevel facet arthrosis. Vascular calcifications are noted. IMPRESSION: No acute osseous abnormality.  Degenerative changes as above. Electronically Signed   By: Constance Holster M.D.   On: 07/03/2019 18:57   Dg Chest Port 1 View  Result Date: 07/03/2019 CLINICAL DATA:  Shortness of breath EXAM: PORTABLE CHEST 1 VIEW COMPARISON:  June 12, 2019 FINDINGS: Postoperative changes noted on the right with volume loss. There is ill-defined opacity in the lateral right base with small right pleural effusion. Left lung is clear. Heart size and pulmonary vascularity are normal. No adenopathy evident. There is aortic atherosclerosis. No bone lesions. IMPRESSION: Area of ill-defined opacity lateral right base, likely pneumonia. Small right pleural effusion. Postoperative change with volume loss and scarring on the right. Left lung clear. Stable cardiac silhouette. No adenopathy evident. Aortic Atherosclerosis (ICD10-I70.0). Followup PA and lateral chest radiographs recommended in 3-4 weeks following trial of antibiotic therapy to ensure resolution and exclude underlying malignancy. Electronically Signed   By: Lowella Grip III M.D.   On: 07/03/2019 18:27    Pending Labs Unresulted Labs (From admission, onward)    Start     Ordered   07/10/19 0500  Creatinine, serum  (enoxaparin (LOVENOX)    CrCl >/= 30 ml/min)  Weekly,   R    Comments: while on enoxaparin therapy    07/03/19 2026   07/03/19 2113  Gastrointestinal Panel by PCR , Stool  (Gastrointestinal Panel by PCR, Stool)  Once,   STAT     07/03/19 2113   07/03/19 2113  C Difficile Quick Screen w PCR reflex  (Gastrointestinal Panel  by PCR, Stool)  Once, for 24 hours,   STAT     07/03/19 2113   07/03/19 2027  Legionella Pneumophila Serogp 1 Ur Ag  Once,   STAT     07/03/19 2026   07/03/19 2027  Strep pneumoniae urinary antigen  Once,   STAT     07/03/19 2026   07/03/19 2025  HIV antibody (Routine Screening)  Once,   STAT     07/03/19 2026   07/03/19 1850  Culture, blood (routine x 2)  BLOOD CULTURE X 2,   STAT     07/03/19 1849   07/03/19 1729  Gastrointestinal Panel by PCR , Stool  (Gastrointestinal Panel by PCR, Stool)  Once,   STAT     07/03/19 1728   07/03/19 1729  C Difficile Quick Screen w PCR reflex  (Gastrointestinal Panel by PCR, Stool)  Once, for 24 hours,   STAT     07/03/19 1728   07/03/19 1727  Urinalysis, Routine w reflex microscopic  ONCE - STAT,   STAT     07/03/19 1728   07/03/19 1727  Urine culture  ONCE - STAT,   STAT     07/03/19 1728          Vitals/Pain Today's Vitals   07/03/19 2100 07/03/19 2130  07/03/19 2200 07/03/19 2300  BP: (!) 116/57 (!) 106/56 (!) 105/56 (!) 102/56  Pulse: 94  93 90  Resp: (!) 25 (!) 21 18 (!) 21  Temp:      TempSrc:      SpO2: (!) 87%  98% 99%    Isolation Precautions Enteric precautions (UV disinfection)  Medications Medications  enoxaparin (LOVENOX) injection 40 mg (has no administration in time range)  ceFEPIme (MAXIPIME) 2 g in sodium chloride 0.9 % 100 mL IVPB (has no administration in time range)  vancomycin (VANCOCIN) IVPB 1000 mg/200 mL premix (has no administration in time range)  sodium chloride 0.9 % bolus 250 mL (0 mLs Intravenous Stopped 07/03/19 1907)  vancomycin (VANCOCIN) 1,250 mg in sodium chloride 0.9 % 250 mL IVPB (0 mg Intravenous Stopped 07/03/19 2211)  ceFEPIme (MAXIPIME) 2 g in sodium chloride 0.9 % 100 mL IVPB (0 g Intravenous Stopped 07/03/19 2036)    Mobility walks

## 2019-07-03 NOTE — Progress Notes (Signed)
A consult was received from an ED physician for vancomycin & cefepime per pharmacy dosing.  The patient's profile has been reviewed for ht/wt/allergies/indication/available labs.   A one time order has been placed for cefepime 2 gm and vancomycin 1250 mg .    Further antibiotics/pharmacy consults should be ordered by admitting physician if indicated.                       Thank you,  Eudelia Bunch, Pharm.D [phone] 07/03/2019 7:03 PM

## 2019-07-03 NOTE — ED Provider Notes (Signed)
[facility] DEPT Provider Note   CSN: [id number] Arrival date & time: 07/03/19  1629    History   Chief Complaint No chief complaint on file.   HPI Tonya Bradley is a 68 y.o. female with a past medical history of CHF, chronic hypoxic respiratory failure wears 4 to 5 L nasal cannula at all times, COPD, lung cancer, who presents today for evaluation of generally feeling weak and unwell.  She reports that since she was released from the hospital on 06/19/2019 she has generally felt weak and unwell.  She states that she has stopped chemotherapy.  She was seen on 7/30 by cardiology where she was reportedly hypo-magnesium and concern for a 7 pound weight gain since hospital discharge as she had stopped her p.o. diuretics.  She reportedly stopped her p.o. anti-diuretics feels that she retains fluid in her abdomen.    She reports that she had been constipated, 2 days ago she reports that she took a dose of Ex-Lax however did not have a bowel movement.  Yesterday she drank a 10 ounce bottle of magnesium citrate which has 1.745 g of magnesium citrate in each ounce.  She reports that after that she had diarrhea multiple times and was unable to control her bowels.  She states that she is continuing to have diarrhea and is currently wearing a diaper.  She states that her husband made her come today due to the amount of diarrhea that she was having.  She denies any abdominal pain.  She denies fevers.  She did report that on 4lpm her oxygen was in the 70s.      HPI  Past Medical History:  Diagnosis Date   Acute on chronic diastolic (congestive) heart failure ([place]) 06/12/2019   AKI (acute kidney injury) ([place])    Anemia    Anxiety    Arthritis    "back"   Atrial flutter ([place]) 03/13/2016   CAD (coronary artery disease)    Cancer (HCC)    skin cancer   Cataract    Colon polyp    Complication of anesthesia    BP dropped during esophagus diltation in  2017-admitted for 3 days.    COPD (chronic obstructive pulmonary disease) (HCC)    Depression    Dysrhythmia    Emphysema of lung (HCC)    GERD (gastroesophageal reflux disease)    Hypercholesterolemia    Lung cancer ([place])    Osteoporosis    Oxygen deficiency     Patient Active Problem List   Diagnosis Date Noted   HCAP (healthcare-associated pneumonia) 07/03/2019   Diarrhea 07/03/2019   Anemia 07/03/2019   SVT (supraventricular tachycardia) ([place]) 06/17/2019   Acute respiratory failure with hypoxemia (HCC)    Shock ([place])    AKI (acute kidney injury) ([place])    Acute on chronic diastolic (congestive) heart failure ([place]) 06/12/2019   Chemotherapy induced nausea and vomiting 05/26/2019   Goals of care, counseling/discussion 03/25/2019   Encounter for antineoplastic chemotherapy 03/25/2019   Small cell lung cancer, right ([place]) 02/18/2019   S/P Right Upper Lobectomy, Right Middle Lobectomy 02/04/2019   Colitis 01/01/2019   Dehydration 01/01/2019   Hypokalemia 01/01/2019   Dysphonia 06/19/2018   Bilateral lower extremity edema 02/16/2018   Lumbar stenosis 01/19/2018   GERD (gastroesophageal reflux disease) 11/18/2017   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   COPD (chronic obstructive pulmonary disease) ([place]) 09/09/2017   Varicose veins of bilateral lower extremities with other complications 67/11/4579  Chronic pain disorder 05/26/2017   Back pain 05/26/2017   Insomnia 05/26/2017   Osteopenia 11/26/2016   Other emphysema ([place]) 11/07/2016   Chronic anxiety 11/07/2016   Chronic hypoxemic respiratory failure ([place]) 11/07/2016   Constipation 06/12/2016   Right carotid bruit 04/03/2016   CAD (coronary artery disease) 04/03/2016   Tobacco abuse 04/03/2016   Atrial flutter ([place]) 03/13/2016   Hiatal hernia    Reflux esophagitis    Schatzki's ring    Dysphagia 03/01/2016   Solitary pulmonary nodule  03/01/2016   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 Rourk:Rectal and colonic polyps-removed, Pancolonic diverticulosis. lymphocytic colitis   CORONARY ANGIOPLASTY WITH STENT PLACEMENT     ESOPHAGEAL DILATION N/A 03/13/2016   Procedure: ESOPHAGEAL DILATION;  Surgeon: Daneil Dolin, MD;  [facility];  Service: Endoscopy;  Laterality: N/A;   ESOPHAGOGASTRODUODENOSCOPY N/A 03/13/2016   Procedure: ESOPHAGOGASTRODUODENOSCOPY (EGD);  Surgeon: Daneil Dolin, MD;  [facility];  Service: Endoscopy;  Laterality: N/A;  0930   EYE SURGERY     cataract removed, bilateral   IR THORACENTESIS ASP PLEURAL SPACE W/IMG GUIDE  02/15/2019   LUMBAR LAMINECTOMY/DECOMPRESSION MICRODISCECTOMY N/A 01/19/2018   Procedure: L4-5 DECOMPRESSION;  Surgeon: Marybelle Killings, MD;  Location: [facility];  Service: Orthopedics;  Laterality: N/A;   VIDEO ASSISTED THORACOSCOPY (VATS)/ LOBECTOMY Right 02/04/2019   Procedure: VIDEO ASSISTED THORACOSCOPY (VATS)/RIGHT UPPER AND MIDDLE LOBECTOMY;  Surgeon: Melrose Nakayama, MD;  Location: [facility];  Service: Thoracic;  Laterality: Right;   VIDEO BRONCHOSCOPY N/A 02/14/2019   Procedure: BRONCHOSCOPY WITH SEDATION;  Surgeon: Melrose Nakayama, MD;  Location: [facility];  Service: Thoracic;  Laterality: N/A;     OB History   No obstetric history on file.      Home Medications    Prior to Admission medications   Medication Sig Start Date End Date  Taking? Authorizing Provider  ALPRAZolam Duanne Moron) 0.5 MG tablet Take 1 tablet (0.5 mg total) by mouth 2 (two) times daily. 04/19/19   [place], [name], MD  atorvastatin (LIPITOR) 80 MG tablet TAKE 1 TABLET(80 MG) BY MOUTH DAILY 04/28/19   [place], [name], MD  Cholecalciferol (VITAMIN D) 2000 units CAPS Take 2,000 Units by mouth daily.    [provider]  cycloSPORINE (RESTASIS) 0.05 % ophthalmic emulsion Place 1 drop into both eyes 2 (two) times daily.    [provider]  Dexlansoprazole (DEXILANT) 30 MG capsule Take 1 capsule (30 mg total) by mouth daily. 02/19/19   [place], [name], MD  diltiazem (CARDIZEM CD) 120 MG 24 hr capsule TAKE 1 CAPSULE(120 MG) BY MOUTH DAILY 01/08/19   [place], [name], MD  DULoxetine (CYMBALTA) 60 MG capsule Take 1 capsule (60 mg total) by mouth daily. 10/06/18   [place], [name], MD  Fluticasone-Umeclidin-Vilant (TRELEGY ELLIPTA) 100-62.5-25 MCG/INH AEPB Inhale 1 puff into the lungs daily. 06/29/19   [place], [name], MD  HYDROcodone-acetaminophen (NORCO/VICODIN) 5-325 MG tablet Take  1 tablet by mouth every 6 (six) hours as needed for moderate pain. 06/29/19   [place], [name], MD  ipratropium-albuterol (DUONEB) 0.5-2.5 (3) MG/3ML SOLN Take 3 mLs by nebulization every 4 (four) hours as needed (shortness of breath or wheezing). 06/19/19   Donne Hazel, MD  loratadine (CLARITIN) 10 MG tablet Take 10 mg by mouth daily.    [provider]  magnesium oxide (MAG-OX) 400 (241.3 Mg) MG tablet Take 1 tablet (400 mg total) by mouth 2 (two) times daily. 06/29/19   Heilingoetter, Cassandra L, PA-C  metoCLOPramide (REGLAN) 5 MG tablet Take 1 tablet (5 mg total) by mouth 3 (three) times daily before meals. 05/11/19   Melrose Nakayama, MD  metoprolol tartrate (LOPRESSOR) 25 MG tablet Take 0.5 tablets (12.5 mg total) by mouth 2 (two) times daily. 06/29/19   [place], [name], MD  Multiple Vitamin (MULTIVITAMIN WITH MINERALS) TABS tablet Take 1 tablet by mouth daily.     [provider]  ondansetron (ZOFRAN) 4 MG tablet TAKE 1 TABLET(4 MG) BY MOUTH TWICE DAILY AS NEEDED FOR NAUSEA OR VOMITING 05/11/19   [place], [name], MD  potassium chloride (K-DUR) 10 MEQ tablet Take 1 tablet (10 mEq total) by mouth 2 (two) times daily. 05/27/19   Evans Lance, MD  predniSONE (DELTASONE) 10 MG tablet Taper dose: 60mg  po daily x 3 days, then 40mg  po daily x 3 days, then 20mg  po daily x 3 days, then 10mg  po daily x 3 days, then 5mg  po daily x 2 days, then stop. Zero refills 06/19/19   Donne Hazel, MD  prochlorperazine (COMPAZINE) 10 MG tablet TAKE 1 TABLET(10 MG) BY MOUTH EVERY 6 HOURS AS NEEDED FOR NAUSEA OR VOMITING 05/10/19   Curt Bears, MD  QUEtiapine (SEROQUEL) 100 MG tablet Take 1.5 tablets (150 mg total) by mouth at bedtime. 06/29/19   [place], [name], MD  rOPINIRole (REQUIP) 0.25 MG tablet Take 2 tablets (0.5 mg total) by mouth at bedtime. 06/28/19   [place], [name], MD  torsemide (DEMADEX) 20 MG tablet Take 1 tablet (20 mg total) by mouth as needed. 07/01/19   Rosita Fire, Brittainy M, PA-C  VENTOLIN HFA 108 (90 Base) MCG/ACT inhaler INHALE 2 PUFFS INTO THE LUNGS EVERY 6 HOURS AS NEEDED FOR WHEEZING OR SHORTNESS OF BREATH 09/19/17   [place], [name], MD    Family History Family History  Problem Relation Age of Onset   Breast cancer Sister 64   Heart disease Sister    Heart attack Father    Heart disease Father    Stroke Mother    Heart disease Mother    Hyperlipidemia Mother    Heart attack Sister    Heart disease Brother        ?heart failure   Congestive Heart Failure Brother    Hypertension Son    Heart disease Maternal Grandfather    Cancer Cousin        lymphoma   Hypertension Son    Stomach cancer Other    Colon cancer Neg Hx    Pancreatic cancer Neg Hx     Social History Social History   Tobacco Use   Smoking status: Current Every Day Smoker    Packs/day: 0.25    Years: 54.00    Pack years: 13.50    Types:  Cigarettes    Start date: 06/14/1966   Smokeless tobacco: Never Used   Tobacco comment: 1-2 packs daily. Chantix too expensive; pt states that she  quit 2 weeks ago (02/04/19)   Substance Use Topics   Alcohol use: No    Alcohol/week: 0.0 standard drinks   Drug use: No     Allergies   Morphine and related   Review of Systems Review of Systems  Constitutional: Negative for chills and fever.  HENT: Negative for congestion.   Eyes: Negative for visual disturbance.  Respiratory: Negative for apnea, cough and shortness of breath.   Cardiovascular: Negative for chest pain, palpitations and leg swelling.  Gastrointestinal: Positive for diarrhea. Negative for abdominal pain, blood in stool, nausea and vomiting.  Genitourinary: Negative for dysuria.  Musculoskeletal: Negative for back pain and neck pain.  Skin: Negative for color change and rash.  Neurological: Positive for weakness (Generalized). Negative for headaches.  Psychiatric/Behavioral: Negative for confusion.  All other systems reviewed and are negative.    Physical Exam Updated Vital Signs BP (!) 108/55    Pulse 89    Temp 98.1 F (36.7 C) (Oral)    Resp 19    SpO2 99%   Physical Exam Vitals signs and nursing note reviewed.  Constitutional:      General: She is not in acute distress.    Appearance: She is well-developed. She is not diaphoretic.     Comments: Chronically ill appearing  HENT:     Head: Normocephalic and atraumatic.     Nose: Nose normal.     Mouth/Throat:     Mouth: Mucous membranes are moist.  Eyes:     General: No scleral icterus.       Right eye: No discharge.        Left eye: No discharge.     Conjunctiva/sclera: Conjunctivae normal.  Neck:     Musculoskeletal: Normal range of motion and neck supple.  Cardiovascular:     Rate and Rhythm: Normal rate and regular rhythm.     Pulses: Normal pulses.     Heart sounds: Normal heart sounds.  Pulmonary:     Effort: Pulmonary effort is normal. No  respiratory distress.     Breath sounds: No stridor. Rhonchi (Diffuse, bilaterally) present.  Abdominal:     General: There is distension (Mild).     Tenderness: There is no abdominal tenderness. There is no guarding.  Musculoskeletal:        General: No deformity.     Right lower leg: No edema.     Left lower leg: No edema.  Skin:    General: Skin is warm and dry.  Neurological:     General: No focal deficit present.     Mental Status: She is alert and oriented to person, place, and time.     Cranial Nerves: No cranial nerve deficit.     Motor: No abnormal muscle tone.  Psychiatric:        Mood and Affect: Mood normal.        Behavior: Behavior normal.      ED Treatments / Results  Labs (all labs ordered are listed, but only abnormal results are displayed) Labs Reviewed  COMPREHENSIVE METABOLIC PANEL - Abnormal; Notable for the following components:      Result Value   Chloride 88 (*)    CO2 38 (*)    Glucose, Bld 143 (*)    BUN 25 (*)    Total Protein 6.0 (*)    Albumin 3.2 (*)    All other components within normal limits  CBC WITH DIFFERENTIAL/PLATELET - Abnormal; Notable for the following components:   WBC  21.5 (*)    RBC 2.99 (*)    Hemoglobin 9.0 (*)    HCT 28.4 (*)    RDW 21.8 (*)    Neutro Abs 17.0 (*)    Monocytes Absolute 1.5 (*)    Abs Immature Granulocytes 0.33 (*)    All other components within normal limits  CBG MONITORING, ED - Abnormal; Notable for the following components:   Glucose-Capillary 161 (*)    All other components within normal limits  URINE CULTURE  GASTROINTESTINAL PANEL BY PCR, STOOL (REPLACES STOOL CULTURE)  C DIFFICILE QUICK SCREEN W PCR REFLEX  SARS CORONAVIRUS 2 (HOSPITAL ORDER, PERFORMED IN [facility])  CULTURE, BLOOD (ROUTINE X 2)  CULTURE, BLOOD (ROUTINE X 2)  LIPASE, BLOOD  BRAIN NATRIURETIC PEPTIDE  MAGNESIUM  LACTIC ACID, PLASMA  URINALYSIS, ROUTINE W REFLEX MICROSCOPIC  LACTIC ACID, PLASMA  HIV  ANTIBODY (ROUTINE TESTING W REFLEX)  LEGIONELLA PNEUMOPHILA SEROGP 1 UR AG  STREP PNEUMONIAE URINARY ANTIGEN    EKG EKG Interpretation  Date/Time:  Saturday July 03 2019 17:43:56 EDT Ventricular Rate:  83 PR Interval:    QRS Duration: 88 QT Interval:  379 QTC Calculation: 446 R Axis:   -72 Text Interpretation:  Sinus rhythm Abnormal R-wave progression, late transition Inferior infarct, old Baseline wander in lead(s) II aVF V1 V4 since last tracing no significant change Confirmed by Malvin Johns [phone]) on 07/03/2019 6:38:20 PM   Radiology Dg Lumbar Spine Complete  Result Date: 07/03/2019 CLINICAL DATA:  Low back pain EXAM: LUMBAR SPINE - COMPLETE 4+ VIEW COMPARISON:  January 27, 2018 FINDINGS: There is no acute displaced fracture. There are multilevel degenerative changes throughout the lumbar spine, greatest at the lower lumbar segments. There is multilevel facet arthrosis. Vascular calcifications are noted. IMPRESSION: No acute osseous abnormality.  Degenerative changes as above. Electronically Signed   By: Constance Holster M.D.   On: 07/03/2019 18:57   Dg Chest Port 1 View  Result Date: 07/03/2019 CLINICAL DATA:  Shortness of breath EXAM: PORTABLE CHEST 1 VIEW COMPARISON:  June 12, 2019 FINDINGS: Postoperative changes noted on the right with volume loss. There is ill-defined opacity in the lateral right base with small right pleural effusion. Left lung is clear. Heart size and pulmonary vascularity are normal. No adenopathy evident. There is aortic atherosclerosis. No bone lesions. IMPRESSION: Area of ill-defined opacity lateral right base, likely pneumonia. Small right pleural effusion. Postoperative change with volume loss and scarring on the right. Left lung clear. Stable cardiac silhouette. No adenopathy evident. Aortic Atherosclerosis (ICD10-I70.0). Followup PA and lateral chest radiographs recommended in 3-4 weeks following trial of antibiotic therapy to ensure resolution and  exclude underlying malignancy. Electronically Signed   By: Lowella Grip III M.D.   On: 07/03/2019 18:27    Procedures Procedures (including critical care time)  Medications Ordered in ED Medications  vancomycin (VANCOCIN) 1,250 mg in sodium chloride 0.9 % 250 mL IVPB (1,250 mg Intravenous New Bag/Given 07/03/19 2037)  enoxaparin (LOVENOX) injection 40 mg (has no administration in time range)  ceFEPIme (MAXIPIME) 2 g in sodium chloride 0.9 % 100 mL IVPB (has no administration in time range)  vancomycin (VANCOCIN) IVPB 1000 mg/200 mL premix (has no administration in time range)  sodium chloride 0.9 % bolus 250 mL (0 mLs Intravenous Stopped 07/03/19 1907)  ceFEPIme (MAXIPIME) 2 g in sodium chloride 0.9 % 100 mL IVPB (0 g Intravenous Stopped 07/03/19 2036)     Initial Impression / Assessment and Plan / ED Course  I have reviewed the triage vital signs and the nursing notes.  Pertinent labs & imaging results that were available during my care of the patient were reviewed by me and considered in my medical decision making (see chart for details).  Clinical Course as of Jul 02 2053  Sat Jul 03, 2019  2018 I spoke with Dr. Maudie Mercury who will admit patient.    [EH]    Clinical Course User Index [EH] Lorin Glass, PA-C      Patient presents today for evaluation of 2 complaints. 1.  Hypoxia and increased oxygen: She reports that normally she wears 4 L of oxygen nasal cannula at all times however she reports that at home on this she was hypoxic in the 70s.  Here on 5 L she is in the high 90s.  In addition to this she reports generally feeling weak.  Chest x-ray was obtained showing concern for a lateral right base opacity concerning for pneumonia.  Given her history of cancer and her recent hospitalizations she is started on vancomycin and cefepime.  Her BNP is not significantly elevated, do not suspect heart failure acutely.  Her white count is elevated, however this is improved from her last  labs, it appears that she is chronically on steroids.  She is afebrile, not tachycardic or tachypneic.  Her lactic acid is 1.9, does not meet Sirs/sepsis criteria.  Remainder of labs appear consistent with her baseline.  COVID testing was sent.  2.  Diarrhea: She reports that 2 days ago she took Ex-Lax for constipation without having a bowel movement.  Then yesterday she drank a full 10 ounces of liquid magnesium citrate and had multiple episodes of diarrhea where she was unable to get to the toilet in time.  She denies any abdominal pain.  I suspect that her diarrhea is related to the mag citrate and Ex-Lax, however as she has recently been immunosuppressed stool studies were ordered.  Based on her concern for pneumonia on chest x-ray with worsening hypoxia/increased oxygen requirement patient will be admitted for pneumonia.  I spoke with Dr. Maudie Mercury who will admit the patient.  This patient was seen as a shared visit with Dr. Tamera Punt.   Final Clinical Impressions(s) / ED Diagnoses   Final diagnoses:  HCAP (healthcare-associated pneumonia)    ED Discharge Orders    None       Ollen Gross 07/03/19 2054    Malvin Johns, MD 07/03/19 2205

## 2019-07-03 NOTE — ED Triage Notes (Addendum)
Per EMS; Pt from home. Family reports pt has been weak and had 2 episodes of diarrhea today. 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 Hx of lung cancer, COPD, CHF. Pt ambulatory.  110/70 HR 84 99% 5L RR 18 cbg 161

## 2019-07-03 NOTE — Progress Notes (Signed)
Pharmacy Antibiotic Note  Tonya Bradley is a 68 y.o. female admitted on 07/03/2019 with pneumonia.  PMH significant for CHF, COPD, and lung cancer.  Pharmacy has been consulted for Vancomycin and Cefepime dosing.  Plan:  Vancomycin 1000 mg IV Q 24 hrs. Goal AUC 400-550.  Expected AUC: 476.8, SCr used: 0.82  Cefepime 2gm IV q12h  Follow SCr  Follow culture results and sensitivities    Temp (24hrs), Avg:98.1 F (36.7 C), Min:98.1 F (36.7 C), Max:98.1 F (36.7 C)  Recent Labs  Lab 06/29/19 1431 07/03/19 1727 07/03/19 1728  WBC 33.1* 21.5*  --   CREATININE 0.80 0.82  --   LATICACIDVEN  --   --  1.9    Estimated Creatinine Clearance: 56.1 mL/min (by C-G formula based on SCr of 0.82 mg/dL).    Allergies  Allergen Reactions  . Morphine And Related Other (See Comments)    hallucinations    Antimicrobials this admission: 8/1 Vanc >>   8/1 Cefepime >>    Dose adjustments this admission:    Microbiology results: 8/1 BCx: sent 8/1 SARS Covid 2: ordered  Thank you for allowing pharmacy to be a part of this patient's care.  Everette Rank, PharmD 07/03/2019 8:51 PM

## 2019-07-04 ENCOUNTER — Encounter (HOSPITAL_COMMUNITY): Payer: Self-pay | Admitting: *Deleted

## 2019-07-04 LAB — GASTROINTESTINAL PANEL BY PCR, STOOL (REPLACES STOOL CULTURE)

## 2019-07-04 LAB — CBC
HCT: 27.4 % — ABNORMAL LOW (ref 36.0–46.0)
Hemoglobin: 8.8 g/dL — ABNORMAL LOW (ref 12.0–15.0)
MCH: 30.3 pg (ref 26.0–34.0)
MCHC: 32.1 g/dL (ref 30.0–36.0)
MCV: 94.5 fL (ref 80.0–100.0)
Platelets: 244 10*3/uL (ref 150–400)
RBC: 2.9 MIL/uL — ABNORMAL LOW (ref 3.87–5.11)
RDW: 21.9 % — ABNORMAL HIGH (ref 11.5–15.5)
WBC: 16.4 10*3/uL — ABNORMAL HIGH (ref 4.0–10.5)
nRBC: 0 % (ref 0.0–0.2)

## 2019-07-04 LAB — BASIC METABOLIC PANEL
Anion gap: 12 (ref 5–15)
BUN: 15 mg/dL (ref 8–23)
CO2: 33 mmol/L — ABNORMAL HIGH (ref 22–32)
Calcium: 9.1 mg/dL (ref 8.9–10.3)
Chloride: 93 mmol/L — ABNORMAL LOW (ref 98–111)
Creatinine, Ser: 0.53 mg/dL (ref 0.44–1.00)
GFR calc Af Amer: >60 mL/min (ref >60)
GFR calc non Af Amer: >60 mL/min (ref >60)
Glucose, Bld: 134 mg/dL — ABNORMAL HIGH (ref 70–99)
Potassium: 3.2 mmol/L — ABNORMAL LOW (ref 3.5–5.1)
Sodium: 138 mmol/L (ref 135–145)

## 2019-07-04 LAB — MRSA PCR SCREENING: MRSA by PCR: NEGATIVE

## 2019-07-04 LAB — HIV ANTIBODY (ROUTINE TESTING W REFLEX): HIV Screen 4th Generation wRfx: NONREACTIVE

## 2019-07-04 MED ORDER — DULOXETINE HCL 60 MG PO CPEP
60.0000 mg | ORAL_CAPSULE | Freq: Every day | ORAL | Status: DC
  Administered 2019-07-04 – 2019-07-06 (×3): 60 mg via ORAL
  Filled 2019-07-04 (×3): qty 1

## 2019-07-04 MED ORDER — ROPINIROLE HCL 1 MG PO TABS
0.5000 mg | ORAL_TABLET | Freq: Every day | ORAL | Status: DC
  Administered 2019-07-04 – 2019-07-05 (×2): 0.5 mg via ORAL
  Filled 2019-07-04 (×2): qty 1

## 2019-07-04 MED ORDER — FLUTICASONE-UMECLIDIN-VILANT 100-62.5-25 MCG/INH IN AEPB: 1.0000 | INHALATION_SPRAY | Freq: Every day | RESPIRATORY_TRACT | Status: DC

## 2019-07-04 MED ORDER — POTASSIUM CHLORIDE ER 10 MEQ PO TBCR
10.0000 meq | EXTENDED_RELEASE_TABLET | Freq: Two times a day (BID) | ORAL | Status: DC
  Administered 2019-07-04: 10 meq via ORAL
  Filled 2019-07-04 (×3): qty 1

## 2019-07-04 MED ORDER — PROCHLORPERAZINE MALEATE 10 MG PO TABS: 10.0000 mg | ORAL_TABLET | Freq: Four times a day (QID) | ORAL | Status: DC | PRN

## 2019-07-04 MED ORDER — QUETIAPINE FUMARATE 50 MG PO TABS
150.0000 mg | ORAL_TABLET | Freq: Every day | ORAL | Status: DC
  Administered 2019-07-04 – 2019-07-05 (×2): 150 mg via ORAL
  Filled 2019-07-04 (×2): qty 1

## 2019-07-04 MED ORDER — PANTOPRAZOLE SODIUM 40 MG PO TBEC
40.0000 mg | DELAYED_RELEASE_TABLET | Freq: Every day | ORAL | Status: DC
  Administered 2019-07-04 – 2019-07-06 (×3): 40 mg via ORAL
  Filled 2019-07-04 (×3): qty 1

## 2019-07-04 MED ORDER — UMECLIDINIUM BROMIDE 62.5 MCG/INH IN AEPB
1.0000 | INHALATION_SPRAY | Freq: Every day | RESPIRATORY_TRACT | Status: DC
  Administered 2019-07-04 – 2019-07-06 (×3): 1 via RESPIRATORY_TRACT
  Filled 2019-07-04: qty 7

## 2019-07-04 MED ORDER — SODIUM CHLORIDE 0.9 % IV SOLN
INTRAVENOUS | Status: DC | PRN
  Administered 2019-07-04: 250 mL via INTRAVENOUS

## 2019-07-04 MED ORDER — ADULT MULTIVITAMIN W/MINERALS CH
1.0000 | ORAL_TABLET | Freq: Every day | ORAL | Status: DC
  Administered 2019-07-04 – 2019-07-06 (×3): 1 via ORAL
  Filled 2019-07-04 (×3): qty 1

## 2019-07-04 MED ORDER — POTASSIUM CHLORIDE CRYS ER 20 MEQ PO TBCR
40.0000 meq | EXTENDED_RELEASE_TABLET | Freq: Once | ORAL | Status: AC
  Administered 2019-07-04: 40 meq via ORAL
  Filled 2019-07-04: qty 2

## 2019-07-04 MED ORDER — MAGNESIUM OXIDE 400 (241.3 MG) MG PO TABS
400.0000 mg | ORAL_TABLET | Freq: Two times a day (BID) | ORAL | Status: DC
  Administered 2019-07-04 – 2019-07-06 (×5): 400 mg via ORAL
  Filled 2019-07-04 (×5): qty 1

## 2019-07-04 MED ORDER — FLUTICASONE FUROATE-VILANTEROL 200-25 MCG/INH IN AEPB
1.0000 | INHALATION_SPRAY | Freq: Every day | RESPIRATORY_TRACT | Status: DC
  Administered 2019-07-04 – 2019-07-06 (×3): 1 via RESPIRATORY_TRACT
  Filled 2019-07-04: qty 28

## 2019-07-04 MED ORDER — METOCLOPRAMIDE HCL 5 MG PO TABS
5.0000 mg | ORAL_TABLET | Freq: Three times a day (TID) | ORAL | Status: DC
  Administered 2019-07-04 – 2019-07-06 (×6): 5 mg via ORAL
  Filled 2019-07-04 (×6): qty 1

## 2019-07-04 MED ORDER — METOPROLOL TARTRATE 25 MG PO TABS
12.5000 mg | ORAL_TABLET | Freq: Two times a day (BID) | ORAL | Status: DC
  Administered 2019-07-04 – 2019-07-06 (×4): 12.5 mg via ORAL
  Filled 2019-07-04 (×5): qty 1

## 2019-07-04 MED ORDER — DILTIAZEM HCL ER COATED BEADS 120 MG PO CP24
120.0000 mg | ORAL_CAPSULE | Freq: Every day | ORAL | Status: DC
  Administered 2019-07-04 – 2019-07-06 (×3): 120 mg via ORAL
  Filled 2019-07-04 (×3): qty 1

## 2019-07-04 MED ORDER — ATORVASTATIN CALCIUM 40 MG PO TABS
80.0000 mg | ORAL_TABLET | Freq: Every day | ORAL | Status: DC
  Administered 2019-07-04 – 2019-07-05 (×2): 80 mg via ORAL
  Filled 2019-07-04 (×2): qty 2

## 2019-07-04 MED ORDER — CYCLOSPORINE 0.05 % OP EMUL
1.0000 [drp] | Freq: Two times a day (BID) | OPHTHALMIC | Status: DC
  Administered 2019-07-04 – 2019-07-06 (×4): 1 [drp] via OPHTHALMIC
  Filled 2019-07-04 (×5): qty 30

## 2019-07-04 MED ORDER — LORATADINE 10 MG PO TABS
10.0000 mg | ORAL_TABLET | Freq: Every day | ORAL | Status: DC
  Administered 2019-07-04 – 2019-07-06 (×3): 10 mg via ORAL
  Filled 2019-07-04 (×3): qty 1

## 2019-07-04 MED ORDER — VITAMIN D 25 MCG (1000 UNIT) PO TABS
2000.0000 [IU] | ORAL_TABLET | Freq: Every day | ORAL | Status: DC
  Administered 2019-07-04 – 2019-07-06 (×3): 2000 [IU] via ORAL
  Filled 2019-07-04 (×3): qty 2

## 2019-07-04 MED ORDER — HYDROCODONE-ACETAMINOPHEN 5-325 MG PO TABS: 1.0000 | ORAL_TABLET | Freq: Four times a day (QID) | ORAL | Status: DC | PRN

## 2019-07-04 MED ORDER — ALBUTEROL SULFATE (2.5 MG/3ML) 0.083% IN NEBU: 2.5000 mg | INHALATION_SOLUTION | Freq: Four times a day (QID) | RESPIRATORY_TRACT | Status: DC | PRN

## 2019-07-04 MED ORDER — ALPRAZOLAM 0.5 MG PO TABS
0.5000 mg | ORAL_TABLET | Freq: Two times a day (BID) | ORAL | Status: DC
  Administered 2019-07-04 – 2019-07-06 (×5): 0.5 mg via ORAL
  Filled 2019-07-04 (×5): qty 1

## 2019-07-04 MED ORDER — TORSEMIDE 20 MG PO TABS
20.0000 mg | ORAL_TABLET | Freq: Every day | ORAL | Status: DC
  Administered 2019-07-04 – 2019-07-06 (×3): 20 mg via ORAL
  Filled 2019-07-04 (×3): qty 1

## 2019-07-04 NOTE — Progress Notes (Signed)
PROGRESS NOTE    Tonya Bradley  QMG:[id number] DOB: 1950/12/30 DOA: 07/03/2019 PCP: [place], [name], MD     Brief Narrative:  Tonya Bradley is a 68 y.o. female,  chronic diastolic CHF, Aflutter/fib, CAD, COPD on home 4L O2 at baseline, small cell lung cancer, tobacco abuse, with recent admission for acute on chronic hypoxic respiratory failure presents with chief complaint of worsening oxygen saturation needing 5L O2 [place] instead of 4L O2, as well as cough.  She also admitted to diarrhea at home.  In the emergency department, work-up revealed ill-defined opacity right base likely a pneumonia.  Patient was admitted for HCAP and started on empiric antibiotics.  New events last 24 hours / Subjective: States that her breathing is about the same, currently on 5 L nasal cannula O2 this morning.  Continues to have a dry cough.  No fevers overnight.  Continues to have diarrhea 2-3 episodes daily.  Also admits to some lower abdominal pain.  Assessment & Plan:   Principal Problem:   HCAP (healthcare-associated pneumonia) Active Problems:   Atrial flutter ([place])   CAD (coronary artery disease)   COPD (chronic obstructive pulmonary disease) ([place])   Diarrhea   Anemia   HCAP with acute on chronic hypoxemic respiratory failure -Sepsis ruled out. Pneumonia present on admission -SARS-CoV-2 negative -Blood cultures pending -Continue to wean down nasal cannula O2 to baseline 4 L as able -Continue Vanco, cefepime -Check MRSA PCR if negative, can just continue vancomycin  Diarrhea -GI PCR pending, patient did take magnesium citrate prior to admission  Small cell lung cancer -Under care of Dr. Julien Nordmann  Paroxysmal atrial flutter status post ablation in 2018 -Continue Cardizem, metoprolol.  Patient is not on anticoagulation at this time -Follows with Dr. Lovena Le  Chronic diastolic heart failure -Continue torsemide, metoprolol -Does not appear to be in acute exacerbation at this time.  BNP  67  GERD -Continue PPI  Depression, anxiety -Continue Cymbalta, Seroquel  Hypokalemia -Replace, trend        DVT prophylaxis: Lovenox Code Status: DNR, confirmed with patient today Family Communication: None Disposition Plan: Pending clinical improvement   Consultants:   None  Procedures:   None   Antimicrobials:  Anti-infectives (From admission, onward)   Start     Dose/Rate Route Frequency Ordered Stop   07/04/19 2100  vancomycin (VANCOCIN) IVPB 1000 mg/200 mL premix     1,000 mg 200 mL/hr over 60 Minutes Intravenous Every 24 hours 07/03/19 2050     07/04/19 0800  ceFEPIme (MAXIPIME) 2 g in sodium chloride 0.9 % 100 mL IVPB     2 g 200 mL/hr over 30 Minutes Intravenous Every 12 hours 07/03/19 2045     07/03/19 1930  vancomycin (VANCOCIN) 1,250 mg in sodium chloride 0.9 % 250 mL IVPB     1,250 mg 166.7 mL/hr over 90 Minutes Intravenous  Once 07/03/19 1853 07/03/19 2211   07/03/19 1900  ceFEPIme (MAXIPIME) 1 g in sodium chloride 0.9 % 100 mL IVPB  Status:  Discontinued     1 g 200 mL/hr over 30 Minutes Intravenous  Once 07/03/19 1850 07/03/19 1853   07/03/19 1900  ceFEPIme (MAXIPIME) 2 g in sodium chloride 0.9 % 100 mL IVPB     2 g 200 mL/hr over 30 Minutes Intravenous  Once 07/03/19 1853 07/03/19 2036        Objective: Vitals:   07/03/19 2300 07/03/19 2350 07/04/19 0000 07/04/19 0539  BP: (!) 102/56 (!) 121/55  136/66  Pulse: 90 92  97  Resp: (!) 21 18  18   Temp:  98.7 F (37.1 C)  98.1 F (36.7 C)  TempSrc:  Oral  Oral  SpO2: 99% (!) 89% 90% (!) 88%  Weight:  63.1 kg    Height:  5\' 2"  (1.575 m)      Intake/Output Summary (Last 24 hours) at 07/04/2019 1247 Last data filed at 07/04/2019 1100 Gross per 24 hour  Intake 600 ml  Output 150 ml  Net 450 ml   Filed Weights   07/03/19 2350  Weight: 63.1 kg    Examination:  General exam: Appears calm and comfortable  Respiratory system: Fine crackles, without respiratory distress, on 5 L nasal  cannula O2 this morning Cardiovascular system: S1 & S2 heard, RRR. No JVD, murmurs, rubs, gallops or clicks. No pedal edema. Gastrointestinal system: Abdomen is nondistended, soft and nontender. No organomegaly or masses felt. Normal bowel sounds heard. Central nervous system: Alert and oriented. No focal neurological deficits. Extremities: Symmetric 5 x 5 power. Skin: No rashes, lesions or ulcers Psychiatry: Judgement and insight appear normal. Mood & affect appropriate.   Data Reviewed: I have personally reviewed following labs and imaging studies  CBC: Recent Labs  Lab 06/29/19 1431 07/03/19 1727 07/04/19 0847  WBC 33.1* 21.5* 16.4*  NEUTROABS 24.3* 17.0*  --   HGB 10.8* 9.0* 8.8*  HCT 33.0* 28.4* 27.4*  MCV 90.2 95.0 94.5  PLT 280 263 836   Basic Metabolic Panel: Recent Labs  Lab 06/29/19 1431 07/03/19 1727 07/04/19 0847  NA 137 137 138  K 4.5 4.2 3.2*  CL 89* 88* 93*  CO2 34* 38* 33*  GLUCOSE 132* 143* 134*  BUN 28* 25* 15  CREATININE 0.80 0.82 0.53  CALCIUM 9.7 9.0 9.1  MG 1.4* 2.2  --    GFR: Estimated Creatinine Clearance: 57.9 mL/min (by C-G formula based on SCr of 0.53 mg/dL). Liver Function Tests: Recent Labs  Lab 06/29/19 1431 07/03/19 1727  AST 21 24  ALT 26 36  ALKPHOS 108 103  BILITOT 0.2* 0.7  PROT 6.7 6.0*  ALBUMIN 3.7 3.2*   Recent Labs  Lab 07/03/19 1727  LIPASE 19   No results for input(s): AMMONIA in the last 168 hours. Coagulation Profile: No results for input(s): INR, PROTIME in the last 168 hours. Cardiac Enzymes: No results for input(s): CKTOTAL, CKMB, CKMBINDEX, TROPONINI in the last 168 hours. BNP (last 3 results) No results for input(s): PROBNP in the last 8760 hours. HbA1C: No results for input(s): HGBA1C in the last 72 hours. CBG: Recent Labs  Lab 07/03/19 1647  GLUCAP 161*   Lipid Profile: No results for input(s): CHOL, HDL, LDLCALC, TRIG, CHOLHDL, LDLDIRECT in the last 72 hours. Thyroid Function Tests: No  results for input(s): TSH, T4TOTAL, FREET4, T3FREE, THYROIDAB in the last 72 hours. Anemia Panel: No results for input(s): VITAMINB12, FOLATE, FERRITIN, TIBC, IRON, RETICCTPCT in the last 72 hours. Sepsis Labs: Recent Labs  Lab 07/03/19 1728  LATICACIDVEN 1.9    Recent Results (from the past 240 hour(s))  SARS Coronavirus 2 [facility] order, Performed in [facility] hospital lab) Nasopharyngeal Nasopharyngeal Swab     Status: None   Collection Time: 07/03/19  6:48 PM   Specimen: Nasopharyngeal Swab  Result Value Ref Range Status   SARS Coronavirus 2 NEGATIVE NEGATIVE Final    Comment: (NOTE) If result is NEGATIVE SARS-CoV-2 target nucleic acids are NOT DETECTED. The SARS-CoV-2 RNA is generally detectable in upper and lower  respiratory specimens during the acute phase of infection. The lowest  concentration of SARS-CoV-2 viral copies this assay can detect is 250  copies / mL. A negative result does not preclude SARS-CoV-2 infection  and should not be used as the sole basis for treatment or other  patient management decisions.  A negative result may occur with  improper specimen collection / handling, submission of specimen other  than nasopharyngeal swab, presence of viral mutation(s) within the  areas targeted by this assay, and inadequate number of viral copies  (<250 copies / mL). A negative result must be combined with clinical  observations, patient history, and epidemiological information. If result is POSITIVE SARS-CoV-2 target nucleic acids are DETECTED. The SARS-CoV-2 RNA is generally detectable in upper and lower  respiratory specimens dur ing the acute phase of infection.  Positive  results are indicative of active infection with SARS-CoV-2.  Clinical  correlation with patient history and other diagnostic information is  necessary to determine patient infection status.  Positive results do  not rule out bacterial infection or co-infection with other viruses. If result  is PRESUMPTIVE POSTIVE SARS-CoV-2 nucleic acids MAY BE PRESENT.   A presumptive positive result was obtained on the submitted specimen  and confirmed on repeat testing.  While 2019 novel coronavirus  (SARS-CoV-2) nucleic acids may be present in the submitted sample  additional confirmatory testing may be necessary for epidemiological  and / or clinical management purposes  to differentiate between  SARS-CoV-2 and other Sarbecovirus currently known to infect humans.  If clinically indicated additional testing with an alternate test  methodology [phone]) is advised. The SARS-CoV-2 RNA is generally  detectable in upper and lower respiratory sp ecimens during the acute  phase of infection. The expected result is Negative. Fact Sheet for Patients:  StrictlyIdeas.no Fact Sheet for Healthcare Providers: BankingDealers.co.za This test is not yet approved or cleared by the [place] FDA and has been authorized for detection and/or diagnosis of SARS-CoV-2 by FDA under an Emergency Use Authorization (EUA).  This EUA will remain in effect (meaning this test can be used) for the duration of the COVID-19 declaration under Section 564(b)(1) of the Act, 21 U.S.C. section 360bbb-3(b)(1), unless the authorization is terminated or revoked sooner. Performed at [facility], [place] [address] [place], [place] [address]   Culture, blood (routine x 2)     Status: None (Preliminary result)   Collection Time: 07/03/19  6:50 PM   Specimen: BLOOD RIGHT HAND  Result Value Ref Range Status   Specimen Description   Final    BLOOD RIGHT HAND Performed at [place] [address] [place], [place] [address]    Special Requests   Final    BOTTLES DRAWN AEROBIC AND ANAEROBIC Blood Culture results may not be optimal due to an inadequate volume of blood received in culture bottles Performed at [place] [address], [place], [place] [address]    Culture   Final    NO GROWTH < 12 HOURS Performed at [place] [address], [place], [place] [address]    Report Status PENDING  Incomplete  Culture, blood (routine x 2)     Status: None (Preliminary result)   Collection Time: 07/03/19  6:55 PM   Specimen: BLOOD  Result Value Ref Range Status   Specimen Description   Final    BLOOD LEFT ANTECUBITAL Performed at [facility], [place] [address], [place], [place] [address]  Special Requests   Final    BOTTLES DRAWN AEROBIC ONLY Blood Culture adequate volume Performed at [place] [address], [place], [place] [address]    Culture   Final    NO GROWTH < 12 HOURS Performed at [place] [address], [place], [place] [address]    Report Status PENDING  Incomplete      Radiology Studies: Dg Lumbar Spine Complete  Result Date: 07/03/2019 CLINICAL DATA:  Low back pain EXAM: LUMBAR SPINE - COMPLETE 4+ VIEW COMPARISON:  January 27, 2018 FINDINGS: There is no acute displaced fracture. There are multilevel degenerative changes throughout the lumbar spine, greatest at the lower lumbar segments. There is multilevel facet arthrosis. Vascular calcifications are noted. IMPRESSION: No acute osseous abnormality.  Degenerative changes as above. Electronically Signed   By: Constance Holster M.D.   On: 07/03/2019 18:57   Dg Chest Port 1 View  Result Date: 07/03/2019 CLINICAL DATA:  Shortness of breath EXAM: PORTABLE CHEST 1 VIEW COMPARISON:  June 12, 2019 FINDINGS: Postoperative changes noted on the right with volume loss. There is ill-defined opacity in the lateral right base with small right pleural effusion. Left lung is clear. Heart size and pulmonary vascularity are normal. No adenopathy evident. There is aortic atherosclerosis. No bone lesions. IMPRESSION: Area of ill-defined opacity lateral right base, likely pneumonia. Small right pleural  effusion. Postoperative change with volume loss and scarring on the right. Left lung clear. Stable cardiac silhouette. No adenopathy evident. Aortic Atherosclerosis (ICD10-I70.0). Followup PA and lateral chest radiographs recommended in 3-4 weeks following trial of antibiotic therapy to ensure resolution and exclude underlying malignancy. Electronically Signed   By: Lowella Grip III M.D.   On: 07/03/2019 18:27      Scheduled Meds:  ALPRAZolam  0.5 mg Oral BID   atorvastatin  80 mg Oral q1800   cholecalciferol  2,000 Units Oral Daily   cycloSPORINE  1 drop Both Eyes BID   diltiazem  120 mg Oral Daily   DULoxetine  60 mg Oral Daily   enoxaparin (LOVENOX) injection  40 mg Subcutaneous Q24H   umeclidinium bromide  1 puff Inhalation Daily   And   fluticasone furoate-vilanterol  1 puff Inhalation Daily   loratadine  10 mg Oral Daily   magnesium oxide  400 mg Oral BID   metoCLOPramide  5 mg Oral TID AC   metoprolol tartrate  12.5 mg Oral BID   multivitamin with minerals  1 tablet Oral Daily   pantoprazole  40 mg Oral Daily   potassium chloride  10 mEq Oral BID   QUEtiapine  150 mg Oral QHS   rOPINIRole  0.5 mg Oral QHS   torsemide  20 mg Oral Daily   Continuous Infusions:  sodium chloride 250 mL (07/04/19 0913)   ceFEPime (MAXIPIME) IV 2 g (07/04/19 0914)   vancomycin       LOS: 1 day      Time spent: 35 minutes   Dessa Phi, DO [facility] [email or web address] 07/04/2019, 12:47 PM

## 2019-07-05 LAB — BASIC METABOLIC PANEL
Anion gap: 11 (ref 5–15)
BUN: 17 mg/dL (ref 8–23)
CO2: 34 mmol/L — ABNORMAL HIGH (ref 22–32)
Calcium: 8.7 mg/dL — ABNORMAL LOW (ref 8.9–10.3)
Chloride: 96 mmol/L — ABNORMAL LOW (ref 98–111)
Creatinine, Ser: 0.57 mg/dL (ref 0.44–1.00)
GFR calc Af Amer: >60 mL/min (ref >60)
GFR calc non Af Amer: >60 mL/min (ref >60)
Glucose, Bld: 95 mg/dL (ref 70–99)
Potassium: 3.2 mmol/L — ABNORMAL LOW (ref 3.5–5.1)
Sodium: 141 mmol/L (ref 135–145)

## 2019-07-05 LAB — URINALYSIS, ROUTINE W REFLEX MICROSCOPIC
Bilirubin Urine: NEGATIVE
Glucose, UA: NEGATIVE mg/dL
Hgb urine dipstick: NEGATIVE
Ketones, ur: NEGATIVE mg/dL
Leukocytes,Ua: NEGATIVE
Nitrite: NEGATIVE
Protein, ur: NEGATIVE mg/dL
Specific Gravity, Urine: 1.012 (ref 1.005–1.030)
pH: 5 (ref 5.0–8.0)

## 2019-07-05 LAB — MAGNESIUM: Magnesium: 2.3 mg/dL (ref 1.7–2.4)

## 2019-07-05 LAB — CBC
HCT: 24.6 % — ABNORMAL LOW (ref 36.0–46.0)
Hemoglobin: 7.6 g/dL — ABNORMAL LOW (ref 12.0–15.0)
MCH: 29.6 pg (ref 26.0–34.0)
MCHC: 30.9 g/dL (ref 30.0–36.0)
MCV: 95.7 fL (ref 80.0–100.0)
Platelets: 220 10*3/uL (ref 150–400)
RBC: 2.57 MIL/uL — ABNORMAL LOW (ref 3.87–5.11)
RDW: 21.9 % — ABNORMAL HIGH (ref 11.5–15.5)
WBC: 10.4 10*3/uL (ref 4.0–10.5)
nRBC: 0 % (ref 0.0–0.2)

## 2019-07-05 LAB — STREP PNEUMONIAE URINARY ANTIGEN: Strep Pneumo Urinary Antigen: NEGATIVE

## 2019-07-05 MED ORDER — POTASSIUM CHLORIDE CRYS ER 20 MEQ PO TBCR
40.0000 meq | EXTENDED_RELEASE_TABLET | Freq: Two times a day (BID) | ORAL | Status: AC
  Administered 2019-07-05 (×2): 40 meq via ORAL
  Filled 2019-07-05 (×2): qty 2

## 2019-07-05 NOTE — Progress Notes (Signed)
PROGRESS NOTE    Tonya Bradley  QQI:[id number] DOB: 1951/09/14 DOA: 07/03/2019 PCP: [place], [name], MD     Brief Narrative:  Tonya Bradley is a 68 y.o. female,  chronic diastolic CHF, Aflutter/fib, CAD, COPD on home 4L O2 at baseline, small cell lung cancer, tobacco abuse, with recent admission for acute on chronic hypoxic respiratory failure presents with chief complaint of worsening oxygen saturation needing 5L O2 [place] instead of 4L O2, as well as cough.  She also admitted to diarrhea at home.  In the emergency department, work-up revealed ill-defined opacity right base likely a pneumonia.  Patient was admitted for HCAP and started on empiric antibiotics.  New events last 24 hours / Subjective: Remains on 5 L nasal cannula O2.  No fevers overnight.  Patient ambulated with PT today, with drop in oxygen saturation to 81% on her baseline 4 L O2.  She required 6 L to improve up to 95%.  Assessment & Plan:   Principal Problem:   HCAP (healthcare-associated pneumonia) Active Problems:   Atrial flutter ([place])   CAD (coronary artery disease)   COPD (chronic obstructive pulmonary disease) ([place])   Diarrhea   Anemia   HCAP with acute on chronic hypoxemic respiratory failure -Sepsis ruled out. Pneumonia present on admission -SARS-CoV-2 negative -Blood cultures negative to date -Continue to wean down nasal cannula O2 to baseline 4 L as able  -Continue cefepime. MRSA PCR if negative, discontinue vancomycin  Diarrhea -GI PCR negative  Small cell lung cancer -Under care of Dr. Julien Nordmann  Paroxysmal atrial flutter status post ablation in 2018 -Continue Cardizem, metoprolol.  Patient is not on anticoagulation at this time -Follows with Dr. Lovena Le  Chronic diastolic heart failure -Continue torsemide, metoprolol -Does not appear to be in acute exacerbation at this time.  BNP 67  GERD -Continue PPI  Depression, anxiety -Continue Cymbalta, Seroquel, Xanax  HLD -Continue lipitor     Hypokalemia -Replace, trend       DVT prophylaxis: Lovenox Code Status: DNR Family Communication: None Disposition Plan: Pending clinical improvement, wean oxygen back to her baseline levels as able.  Not back to baseline yet   Consultants:   None  Procedures:   None   Antimicrobials:  Anti-infectives (From admission, onward)   Start     Dose/Rate Route Frequency Ordered Stop   07/04/19 2100  vancomycin (VANCOCIN) IVPB 1000 mg/200 mL premix  Status:  Discontinued     1,000 mg 200 mL/hr over 60 Minutes Intravenous Every 24 hours 07/03/19 2050 07/04/19 1459   07/04/19 0800  ceFEPIme (MAXIPIME) 2 g in sodium chloride 0.9 % 100 mL IVPB     2 g 200 mL/hr over 30 Minutes Intravenous Every 12 hours 07/03/19 2045     07/03/19 1930  vancomycin (VANCOCIN) 1,250 mg in sodium chloride 0.9 % 250 mL IVPB     1,250 mg 166.7 mL/hr over 90 Minutes Intravenous  Once 07/03/19 1853 07/03/19 2211   07/03/19 1900  ceFEPIme (MAXIPIME) 1 g in sodium chloride 0.9 % 100 mL IVPB  Status:  Discontinued     1 g 200 mL/hr over 30 Minutes Intravenous  Once 07/03/19 1850 07/03/19 1853   07/03/19 1900  ceFEPIme (MAXIPIME) 2 g in sodium chloride 0.9 % 100 mL IVPB     2 g 200 mL/hr over 30 Minutes Intravenous  Once 07/03/19 1853 07/03/19 2036       Objective: Vitals:   07/04/19 0539 07/04/19 1526 07/04/19 2044 07/05/19 0430  BP:  136/66 114/65 111/65 110/67  Pulse: 97 86 86 65  Resp: 18 20 20 20   Temp: 98.1 F (36.7 C) 97.6 F (36.4 C) 97.6 F (36.4 C) 97.8 F (36.6 C)  TempSrc: Oral Oral Oral Oral  SpO2: (!) 88% 91% 90% 92%  Weight:      Height:        Intake/Output Summary (Last 24 hours) at 07/05/2019 1255 Last data filed at 07/05/2019 1145 Gross per 24 hour  Intake 236.15 ml  Output 550 ml  Net -313.85 ml   Filed Weights   07/03/19 2350  Weight: 63.1 kg    Examination: General exam: Appears calm and comfortable  Respiratory system: Bibasilar crackles without wheeze.  No  respiratory distress or conversational dyspnea today Cardiovascular system: S1 & S2 heard, RRR. No JVD, murmurs, rubs, gallops or clicks. No pedal edema. Gastrointestinal system: Abdomen is nondistended, soft and nontender. No organomegaly or masses felt. Normal bowel sounds heard. Central nervous system: Alert and oriented. No focal neurological deficits. Extremities: Symmetric 5 x 5 power. Skin: No rashes, lesions or ulcers Psychiatry: Judgement and insight appear normal. Mood & affect appropriate.     Data Reviewed: I have personally reviewed following labs and imaging studies  CBC: Recent Labs  Lab 06/29/19 1431 07/03/19 1727 07/04/19 0847 07/05/19 0419  WBC 33.1* 21.5* 16.4* 10.4  NEUTROABS 24.3* 17.0*  --   --   HGB 10.8* 9.0* 8.8* 7.6*  HCT 33.0* 28.4* 27.4* 24.6*  MCV 90.2 95.0 94.5 95.7  PLT 280 263 244 063   Basic Metabolic Panel: Recent Labs  Lab 06/29/19 1431 07/03/19 1727 07/04/19 0847 07/05/19 0419  NA 137 137 138 141  K 4.5 4.2 3.2* 3.2*  CL 89* 88* 93* 96*  CO2 34* 38* 33* 34*  GLUCOSE 132* 143* 134* 95  BUN 28* 25* 15 17  CREATININE 0.80 0.82 0.53 0.57  CALCIUM 9.7 9.0 9.1 8.7*  MG 1.4* 2.2  --  2.3   GFR: Estimated Creatinine Clearance: 57.9 mL/min (by C-G formula based on SCr of 0.57 mg/dL). Liver Function Tests: Recent Labs  Lab 06/29/19 1431 07/03/19 1727  AST 21 24  ALT 26 36  ALKPHOS 108 103  BILITOT 0.2* 0.7  PROT 6.7 6.0*  ALBUMIN 3.7 3.2*   Recent Labs  Lab 07/03/19 1727  LIPASE 19   No results for input(s): AMMONIA in the last 168 hours. Coagulation Profile: No results for input(s): INR, PROTIME in the last 168 hours. Cardiac Enzymes: No results for input(s): CKTOTAL, CKMB, CKMBINDEX, TROPONINI in the last 168 hours. BNP (last 3 results) No results for input(s): PROBNP in the last 8760 hours. HbA1C: No results for input(s): HGBA1C in the last 72 hours. CBG: Recent Labs  Lab 07/03/19 1647  GLUCAP 161*   Lipid  Profile: No results for input(s): CHOL, HDL, LDLCALC, TRIG, CHOLHDL, LDLDIRECT in the last 72 hours. Thyroid Function Tests: No results for input(s): TSH, T4TOTAL, FREET4, T3FREE, THYROIDAB in the last 72 hours. Anemia Panel: No results for input(s): VITAMINB12, FOLATE, FERRITIN, TIBC, IRON, RETICCTPCT in the last 72 hours. Sepsis Labs: Recent Labs  Lab 07/03/19 1728  LATICACIDVEN 1.9    Recent Results (from the past 240 hour(s))  SARS Coronavirus 2 [facility] order, Performed in [facility] hospital lab) Nasopharyngeal Nasopharyngeal Swab     Status: None   Collection Time: 07/03/19  6:48 PM   Specimen: Nasopharyngeal Swab  Result Value Ref Range Status   SARS Coronavirus 2 NEGATIVE NEGATIVE Final  Comment: (NOTE) If result is NEGATIVE SARS-CoV-2 target nucleic acids are NOT DETECTED. The SARS-CoV-2 RNA is generally detectable in upper and lower  respiratory specimens during the acute phase of infection. The lowest  concentration of SARS-CoV-2 viral copies this assay can detect is 250  copies / mL. A negative result does not preclude SARS-CoV-2 infection  and should not be used as the sole basis for treatment or other  patient management decisions.  A negative result may occur with  improper specimen collection / handling, submission of specimen other  than nasopharyngeal swab, presence of viral mutation(s) within the  areas targeted by this assay, and inadequate number of viral copies  (<250 copies / mL). A negative result must be combined with clinical  observations, patient history, and epidemiological information. If result is POSITIVE SARS-CoV-2 target nucleic acids are DETECTED. The SARS-CoV-2 RNA is generally detectable in upper and lower  respiratory specimens dur ing the acute phase of infection.  Positive  results are indicative of active infection with SARS-CoV-2.  Clinical  correlation with patient history and other diagnostic information is  necessary to  determine patient infection status.  Positive results do  not rule out bacterial infection or co-infection with other viruses. If result is PRESUMPTIVE POSTIVE SARS-CoV-2 nucleic acids MAY BE PRESENT.   A presumptive positive result was obtained on the submitted specimen  and confirmed on repeat testing.  While 2019 novel coronavirus  (SARS-CoV-2) nucleic acids may be present in the submitted sample  additional confirmatory testing may be necessary for epidemiological  and / or clinical management purposes  to differentiate between  SARS-CoV-2 and other Sarbecovirus currently known to infect humans.  If clinically indicated additional testing with an alternate test  methodology [phone]) is advised. The SARS-CoV-2 RNA is generally  detectable in upper and lower respiratory sp ecimens during the acute  phase of infection. The expected result is Negative. Fact Sheet for Patients:  StrictlyIdeas.no Fact Sheet for Healthcare Providers: BankingDealers.co.za This test is not yet approved or cleared by the [place] FDA and has been authorized for detection and/or diagnosis of SARS-CoV-2 by FDA under an Emergency Use Authorization (EUA).  This EUA will remain in effect (meaning this test can be used) for the duration of the COVID-19 declaration under Section 564(b)(1) of the Act, 21 U.S.C. section 360bbb-3(b)(1), unless the authorization is terminated or revoked sooner. Performed at [facility], [place] [address] [place], [place] [address]   Culture, blood (routine x 2)     Status: None (Preliminary result)   Collection Time: 07/03/19  6:50 PM   Specimen: BLOOD RIGHT HAND  Result Value Ref Range Status   Specimen Description   Final    BLOOD RIGHT HAND Performed at [place] [address], [place], [place] [address]    Special Requests   Final    BOTTLES DRAWN AEROBIC AND ANAEROBIC Blood Culture  results may not be optimal due to an inadequate volume of blood received in culture bottles Performed at [place] [address], [place], [place] [address]    Culture   Final    NO GROWTH 2 DAYS Performed at [place] [address], [place], [place] [address]    Report Status PENDING  Incomplete  Culture, blood (routine x 2)     Status: None (Preliminary result)   Collection Time: 07/03/19  6:55 PM   Specimen: BLOOD  Result Value Ref Range Status   Specimen  Description   Final    BLOOD LEFT ANTECUBITAL Performed at [facility], [place] [address] [place], [place] [address]    Special Requests   Final    BOTTLES DRAWN AEROBIC ONLY Blood Culture adequate volume Performed at [place] [address], [place], [place] [address]    Culture   Final    NO GROWTH 2 DAYS Performed at [place] [address], [place], [place] [address]    Report Status PENDING  Incomplete  Gastrointestinal Panel by PCR , Stool     Status: None   Collection Time: 07/04/19  5:38 AM   Specimen: STOOL  Result Value Ref Range Status   Campylobacter species NOT DETECTED NOT DETECTED Final   Plesimonas shigelloides NOT DETECTED NOT DETECTED Final   Salmonella species NOT DETECTED NOT DETECTED Final   Yersinia enterocolitica NOT DETECTED NOT DETECTED Final   Vibrio species NOT DETECTED NOT DETECTED Final   Vibrio cholerae NOT DETECTED NOT DETECTED Final   Enteroaggregative E coli (EAEC) NOT DETECTED NOT DETECTED Final   Enteropathogenic E coli (EPEC) NOT DETECTED NOT DETECTED Final   Enterotoxigenic E coli (ETEC) NOT DETECTED NOT DETECTED Final   Shiga like toxin producing E coli (STEC) NOT DETECTED NOT DETECTED Final   Shigella/Enteroinvasive E coli (EIEC) NOT DETECTED NOT DETECTED Final   Cryptosporidium NOT DETECTED NOT DETECTED Final   Cyclospora cayetanensis NOT DETECTED NOT DETECTED Final   Entamoeba histolytica NOT DETECTED NOT  DETECTED Final   Giardia lamblia NOT DETECTED NOT DETECTED Final   Adenovirus F40/41 NOT DETECTED NOT DETECTED Final   Astrovirus NOT DETECTED NOT DETECTED Final   Norovirus GI/GII NOT DETECTED NOT DETECTED Final   Rotavirus A NOT DETECTED NOT DETECTED Final   Sapovirus (I, II, IV, and V) NOT DETECTED NOT DETECTED Final    Comment: Performed at [facility], [address] [place], [place] [address]  MRSA PCR Screening     Status: None   Collection Time: 07/04/19  1:00 PM   Specimen: Nasopharyngeal  Result Value Ref Range Status   MRSA by PCR NEGATIVE NEGATIVE Final    Comment:        The GeneXpert MRSA Assay (FDA approved for NASAL specimens only), is one component of a comprehensive MRSA colonization surveillance program. It is not intended to diagnose MRSA infection nor to guide or monitor treatment for MRSA infections. Performed at [facility], [place] [address] [place],  [address]       Radiology Studies: Dg Lumbar Spine Complete  Result Date: 07/03/2019 CLINICAL DATA:  Low back pain EXAM: LUMBAR SPINE - COMPLETE 4+ VIEW COMPARISON:  January 27, 2018 FINDINGS: There is no acute displaced fracture. There are multilevel degenerative changes throughout the lumbar spine, greatest at the lower lumbar segments. There is multilevel facet arthrosis. Vascular calcifications are noted. IMPRESSION: No acute osseous abnormality.  Degenerative changes as above. Electronically Signed   By: Constance Holster M.D.   On: 07/03/2019 18:57   Dg Chest Port 1 View  Result Date: 07/03/2019 CLINICAL DATA:  Shortness of breath EXAM: PORTABLE CHEST 1 VIEW COMPARISON:  June 12, 2019 FINDINGS: Postoperative changes noted on the right with volume loss. There is ill-defined opacity in the lateral right base with small right pleural effusion. Left lung is clear. Heart size and pulmonary vascularity are normal. No adenopathy evident. There is aortic atherosclerosis. No  bone lesions. IMPRESSION: Area of ill-defined opacity lateral right base,  likely pneumonia. Small right pleural effusion. Postoperative change with volume loss and scarring on the right. Left lung clear. Stable cardiac silhouette. No adenopathy evident. Aortic Atherosclerosis (ICD10-I70.0). Followup PA and lateral chest radiographs recommended in 3-4 weeks following trial of antibiotic therapy to ensure resolution and exclude underlying malignancy. Electronically Signed   By: Lowella Grip III M.D.   On: 07/03/2019 18:27      Scheduled Meds: . ALPRAZolam  0.5 mg Oral BID  . atorvastatin  80 mg Oral q1800  . cholecalciferol  2,000 Units Oral Daily  . cycloSPORINE  1 drop Both Eyes BID  . diltiazem  120 mg Oral Daily  . DULoxetine  60 mg Oral Daily  . enoxaparin (LOVENOX) injection  40 mg Subcutaneous Q24H  . umeclidinium bromide  1 puff Inhalation Daily   And  . fluticasone furoate-vilanterol  1 puff Inhalation Daily  . loratadine  10 mg Oral Daily  . magnesium oxide  400 mg Oral BID  . metoCLOPramide  5 mg Oral TID AC  . metoprolol tartrate  12.5 mg Oral BID  . multivitamin with minerals  1 tablet Oral Daily  . pantoprazole  40 mg Oral Daily  . potassium chloride  40 mEq Oral BID  . QUEtiapine  150 mg Oral QHS  . rOPINIRole  0.5 mg Oral QHS  . torsemide  20 mg Oral Daily   Continuous Infusions: . sodium chloride Stopped (07/04/19 1323)  . ceFEPime (MAXIPIME) IV 2 g (07/05/19 0855)     LOS: 2 days      Time spent: 25 minutes   Dessa Phi, DO [facility] [email or web address] 07/05/2019, 12:55 PM

## 2019-07-05 NOTE — Evaluation (Signed)
Occupational Therapy Evaluation Patient Details Name: Tonya Bradley MRN: [id number] DOB: [birth year] Today's Date: 07/05/2019    History of Present Illness 68 y.o. female,  chronic diastolic CHF, Aflutter/fib, CAD, COPD on home 4L O2 at baseline, small cell lung cancer, tobacco abuse, with recent admission for acute on chronic hypoxic respiratory failure and found to have HCAP   Clinical Impression   OT eval and education complete.  ( safety with oxygen cord as well as energy conservation)    Follow Up Recommendations  Supervision - Intermittent    Equipment Recommendations  None recommended by OT    Recommendations for Other Services       Precautions / Restrictions Precautions Precautions: Fall      Mobility Bed Mobility Overal bed mobility: Modified Independent                Transfers Overall transfer level: Modified independent Equipment used: None                  Balance Overall balance assessment: No apparent balance deficits (not formally assessed)                                         ADL either performed or assessed with clinical judgement   ADL Overall ADL's : Modified independent                                       General ADL Comments: Pt needed increased time.  Education provided regarding energy conservation in which verbalized understanding and able to verbalize pratical examples of energy conservation strategies.     Vision Patient Visual Report: No change from baseline              Pertinent Vitals/Pain Pain Assessment: No/denies pain     Hand Dominance     Extremity/Trunk Assessment Upper Extremity Assessment Upper Extremity Assessment: Generalized weakness           Communication Communication Communication: No difficulties   Cognition Arousal/Alertness: Awake/alert Behavior During Therapy: WFL for tasks assessed/performed Overall Cognitive Status: Within Functional Limits  for tasks assessed                                                Home Living Family/patient expects to be discharged to:: Private residence Living Arrangements: Spouse/significant other Available Help at Discharge: Family;Available 24 hours/day Type of Home: House Home Access: Level entry     Home Layout: One level               Home Equipment: Walker - 2 wheels   Additional Comments: pt reports baseline 2-3 L O2      Prior Functioning/Environment Level of Independence: Independent                          OT Goals(Current goals can be found in the care plan section) Acute Rehab OT Goals Patient Stated Goal: home with husband OT Goal Formulation: With patient Time For Goal Achievement: 07/05/19  OT Frequency:      AM-PAC OT "6 Clicks" Daily Activity     Outcome Measure Help from another person  eating meals?: None Help from another person taking care of personal grooming?: None   Help from another person bathing (including washing, rinsing, drying)?: None Help from another person to put on and taking off regular upper body clothing?: None Help from another person to put on and taking off regular lower body clothing?: None 6 Click Score: 20   End of Session Equipment Utilized During Treatment: Rolling walker Nurse Communication: Mobility status  Activity Tolerance: Patient tolerated treatment well Patient left: in chair;with call bell/phone within reach                   Time: 1610-1629 OT Time Calculation (min): 19 min Charges:  OT General Charges $OT Visit: 1 Visit OT Evaluation $OT Eval Low Complexity: 1 Low  Kari Baars, OT Acute Rehabilitation Services [phone] Office- [place] [phone]     Rudd, Edwena Felty D 07/05/2019, 6:18 PM

## 2019-07-05 NOTE — Evaluation (Signed)
Physical Therapy One Time Evaluation Patient Details Name: Genevive Printup MRN: [id number] DOB: 1951/01/27 Today's Date: 07/05/2019   History of Present Illness  68 y.o. female,  chronic diastolic CHF, Aflutter/fib, CAD, COPD on home 4L O2 at baseline, small cell lung cancer, tobacco abuse, with recent admission for acute on chronic hypoxic respiratory failure and found to have HCAP  Clinical Impression  Patient evaluated by Physical Therapy with no further acute PT needs identified. All education has been completed and the patient has no further questions.  See below for any follow-up Physical Therapy or equipment needs. PT is signing off. Thank you for this referral.  Pt ambulated good distance in hallway pushing IV pole however not required for support.  Pt with Spo2 97% on 5L at rest.  Pt ambulated on 4L O2 however SPO2 dropped to 81% requiring 6L O2 [place] for SPO2 95%.  Pt educated on current SPO2 and oxygen demands with activity.  No f/u PT needs observed at this time.     Follow Up Recommendations No PT follow up    Equipment Recommendations  None recommended by PT    Recommendations for Other Services       Precautions / Restrictions Precautions Precautions: Fall Restrictions Weight Bearing Restrictions: No      Mobility  Bed Mobility Overal bed mobility: Modified Independent                Transfers Overall transfer level: Modified independent Equipment used: None                Ambulation/Gait Ambulation/Gait assistance: Supervision;Modified independent (Device/Increase time) Gait Distance (Feet): 120 Feet Assistive device: IV Pole Gait Pattern/deviations: Step-through pattern;Decreased stride length     General Gait Details: slow but steady gait, ambulated on 4L O2 [place] however SPO2 dropped to 81% so increased to 6L and SPo2 95%, pt ambulated as tolerated and reports only mild SOB  Stairs            Wheelchair Mobility    Modified Rankin (Stroke  Patients Only)       Balance Overall balance assessment: No apparent balance deficits (not formally assessed)                                           Pertinent Vitals/Pain Pain Assessment: No/denies pain    Home Living Family/patient expects to be discharged to:: Private residence Living Arrangements: Spouse/significant other Available Help at Discharge: Family;Available 24 hours/day Type of Home: House Home Access: Level entry     Home Layout: One level Home Equipment: Walker - 2 wheels Additional Comments: pt reports baseline 2-3 L O2    Prior Function Level of Independence: Independent               Hand Dominance        Extremity/Trunk Assessment        Lower Extremity Assessment Lower Extremity Assessment: Overall WFL for tasks assessed       Communication   Communication: No difficulties  Cognition Arousal/Alertness: Awake/alert Behavior During Therapy: WFL for tasks assessed/performed Overall Cognitive Status: Within Functional Limits for tasks assessed                                        General Comments  Exercises     Assessment/Plan    PT Assessment Patent does not need any further PT services  PT Problem List         PT Treatment Interventions      PT Goals (Current goals can be found in the Care Plan section)  Acute Rehab PT Goals PT Goal Formulation: All assessment and education complete, DC therapy    Frequency     Barriers to discharge        Co-evaluation               AM-PAC PT "6 Clicks" Mobility  Outcome Measure Help needed turning from your back to your side while in a flat bed without using bedrails?: None Help needed moving from lying on your back to sitting on the side of a flat bed without using bedrails?: None Help needed moving to and from a bed to a chair (including a wheelchair)?: None Help needed standing up from a chair using your arms (e.g., wheelchair  or bedside chair)?: A Little Help needed to walk in hospital room?: A Little Help needed climbing 3-5 steps with a railing? : A Little 6 Click Score: 21    End of Session Equipment Utilized During Treatment: Gait belt;Oxygen Activity Tolerance: Patient tolerated treatment well Patient left: in bed;with call bell/phone within reach;with bed alarm set   PT Visit Diagnosis: Difficulty in walking, not elsewhere classified (R26.2)    Time: 1655-3748 PT Time Calculation (min) (ACUTE ONLY): 16 min   Charges:   PT Evaluation $PT Eval Low Complexity: [place], PT, DPT Acute Rehabilitation Services Office: [phone] Pager: [phone]'  Ester Hilley,KATHrine E 07/05/2019, 12:40 PM

## 2019-07-05 NOTE — TOC Initial Note (Signed)
Transition of Care [facility]) - Initial/Assessment Note    Patient Details  Name: Tonya Bradley MRN: [id number] Date of Birth: [birth year]  Transition of Care (TOC) CM/SW Contact:    Joaquin Courts, RN Phone Number: 07/05/2019, 1:03 PM  Clinical Narrative:       Patient is active with [facility] PT OT RN social work and pending speech. Patient has home O2 with [employer] and may need a portable O2 tank for dc if her ride cannot bring one.             Expected Discharge Plan: [place] Barriers to Discharge: Continued Medical Work up   Patient Goals and CMS Choice Patient states their goals for this hospitalization and ongoing recovery are:: to go home      Expected Discharge Plan and Services Expected Discharge Plan: [place]   Discharge Planning Services: CM Consult   Living arrangements for the past 2 months: Single Family Home Expected Discharge Date: 07/07/19               DME Arranged: N/A DME Agency: NA       HH Arranged: NA HH Agency: NA        Prior Living Arrangements/Services Living arrangements for the past 2 months: Single Family Home Lives with:: Spouse Patient language and need for interpreter reviewed:: Yes Do you feel safe going back to the place where you live?: Yes      Need for Family Participation in Patient Care: Yes (Comment) Care giver support system in place?: Yes (comment)   Criminal Activity/Legal Involvement Pertinent to Current Situation/Hospitalization: No - Comment as needed  Activities of Daily Living Home Assistive Devices/Equipment: None ADL Screening (condition at time of admission) Patient's cognitive ability adequate to safely complete daily activities?: Yes Does the patient have difficulty seeing, even when wearing glasses/contacts?: No Does the patient have difficulty concentrating, remembering, or making decisions?: No Patient able to express need for assistance with ADLs?:  Yes Does the patient have difficulty dressing or bathing?: Yes Independently performs ADLs?: No Communication: Independent Dressing (OT): Needs assistance Is this a change from baseline?: Pre-admission baseline Grooming: Independent Feeding: Independent Bathing: Needs assistance Is this a change from baseline?: Pre-admission baseline Toileting: Needs assistance Is this a change from baseline?: Pre-admission baseline In/Out Bed: Needs assistance Is this a change from baseline?: Pre-admission baseline Walks in Home: Independent Does the patient have difficulty walking or climbing stairs?: No Weakness of Legs: None Weakness of Arms/Hands: None  Permission Sought/Granted                  Emotional Assessment Appearance:: Appears stated age Attitude/Demeanor/Rapport: Engaged Affect (typically observed): Accepting Orientation: : Oriented to Place, Oriented to  Time, Oriented to Situation, Oriented to Self   Psych Involvement: No (comment)  Admission diagnosis:  HCAP (healthcare-associated pneumonia) [J18.9] Patient Active Problem List   Diagnosis Date Noted  . HCAP (healthcare-associated pneumonia) 07/03/2019  . Diarrhea 07/03/2019  . Anemia 07/03/2019  . SVT (supraventricular tachycardia) ([place]) 06/17/2019  . Acute respiratory failure with hypoxemia ([place])   . Shock ([place])   . AKI (acute kidney injury) ([place])   . Acute on chronic diastolic (congestive) heart failure ([place]) 06/12/2019  . Chemotherapy induced nausea and vomiting 05/26/2019  . Goals of care, counseling/discussion 03/25/2019  . Encounter for antineoplastic chemotherapy 03/25/2019  . Small cell lung cancer, right ([place]) 02/18/2019  . S/P Right Upper Lobectomy, Right Middle Lobectomy 02/04/2019  .  Colitis 01/01/2019  . Dehydration 01/01/2019  . Hypokalemia 01/01/2019  . Dysphonia 06/19/2018  . Bilateral lower extremity edema 02/16/2018  . Lumbar stenosis 01/19/2018  . GERD (gastroesophageal reflux disease)  11/18/2017  . 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 COPD (chronic obstructive pulmonary disease) ([place]) 09/09/2017  . Varicose veins of bilateral lower extremities with other complications 94/70/9628  . Chronic pain disorder 05/26/2017  . Back pain 05/26/2017  . Insomnia 05/26/2017  . Osteopenia 11/26/2016  . Other emphysema ([place]) 11/07/2016  . Chronic anxiety 11/07/2016  . Chronic hypoxemic respiratory failure ([place]) 11/07/2016  . Constipation 06/12/2016  . Right carotid bruit 04/03/2016  . CAD (coronary artery disease) 04/03/2016  . Tobacco abuse 04/03/2016  . Atrial flutter ([place]) 03/13/2016  . Hiatal hernia   . Reflux esophagitis   . Schatzki's ring   . Dysphagia 03/01/2016  . Solitary pulmonary nodule 03/01/2016  . 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 Microscopic colitis 08/15/2014  . Loose stools 06/08/2014  . BCC (basal cell carcinoma), face 09/13/2012   PCP:  Martin[place], [name], MD Pharmacy:   [facility] [place], [place] - [address] [place] [address] Phone: [phone] Fax: [phone]  [facility] [place], [address] [place] [address] Phone: [phone] Fax: [phone]     Social Determinants of Health (SDOH) Interventions    Readmission Risk Interventions Readmission Risk Prevention Plan 07/05/2019 06/14/2019 02/09/2019  Transportation Screening Complete Complete Complete  HRI or Home Care Consult - - Complete  Palliative Care Screening - - Not Applicable  Medication Review (RN Care Manager) Complete Not Complete -  Med Review Comments - some medications reported PTA need review per RN admission summary -  PCP or Specialist appointment within 3-5 days of discharge Not Complete Not Complete -  PCP/Specialist Appt Not Complete comments not ready for dc Pt established with PCP and  cardiologist however DC date uknown -  HRI or Home Care Consult Complete Complete -  SW Recovery Care/Counseling Consult Complete Complete -  Palliative Care Screening Not Applicable Not Applicable -  [place] Not Applicable Not Applicable -  Some recent data might be hidden

## 2019-07-06 LAB — BASIC METABOLIC PANEL
Anion gap: 9 (ref 5–15)
BUN: 18 mg/dL (ref 8–23)
CO2: 36 mmol/L — ABNORMAL HIGH (ref 22–32)
Calcium: 8.9 mg/dL (ref 8.9–10.3)
Chloride: 95 mmol/L — ABNORMAL LOW (ref 98–111)
Creatinine, Ser: 0.59 mg/dL (ref 0.44–1.00)
GFR calc Af Amer: >60 mL/min (ref >60)
GFR calc non Af Amer: >60 mL/min (ref >60)
Glucose, Bld: 104 mg/dL — ABNORMAL HIGH (ref 70–99)
Potassium: 3.2 mmol/L — ABNORMAL LOW (ref 3.5–5.1)
Sodium: 140 mmol/L (ref 135–145)

## 2019-07-06 LAB — URINE CULTURE: Culture: <10000 — AB

## 2019-07-06 LAB — CBC
HCT: 24.8 % — ABNORMAL LOW (ref 36.0–46.0)
Hemoglobin: 7.8 g/dL — ABNORMAL LOW (ref 12.0–15.0)
MCH: 29.9 pg (ref 26.0–34.0)
MCHC: 31.5 g/dL (ref 30.0–36.0)
MCV: 95 fL (ref 80.0–100.0)
Platelets: 225 10*3/uL (ref 150–400)
RBC: 2.61 MIL/uL — ABNORMAL LOW (ref 3.87–5.11)
RDW: 22.1 % — ABNORMAL HIGH (ref 11.5–15.5)
WBC: 10 10*3/uL (ref 4.0–10.5)
nRBC: 0 % (ref 0.0–0.2)

## 2019-07-06 MED ORDER — POTASSIUM CHLORIDE CRYS ER 20 MEQ PO TBCR
40.0000 meq | EXTENDED_RELEASE_TABLET | ORAL | Status: DC
  Administered 2019-07-06: 40 meq via ORAL
  Filled 2019-07-06: qty 2

## 2019-07-06 MED ORDER — LEVOFLOXACIN 750 MG PO TABS: 750.0000 mg | ORAL_TABLET | Freq: Every day | ORAL | 0 refills | Status: AC

## 2019-07-06 NOTE — Discharge Summary (Signed)
Physician Discharge Summary  Tonya Bradley VOH:[id number] DOB: 25-May-1951 DOA: 07/03/2019  PCP: Martinique, [name], MD  Admit date: 07/03/2019 Discharge date: 07/06/2019  Admitted From: Home Disposition:  Home   Recommendations for Outpatient Follow-up:  1. Follow up with PCP in 1 week 2. Repeat BMP in 1 week to follow up on hypokalemia. Potassium replaced during hospitalization.  3. Recommend follow-up chest x-ray in 3 to 4 weeks.  Home Health: PT OT RN SW   Equipment/Devices: None, has home O2   Discharge Condition: Stable CODE STATUS: DNR  Diet recommendation: Heart healthy   Brief/Interim Summary: Tonya Bradley is a 68 y.o.female,chronic diastolicCHF, Aflutter/fib, CAD, COPD on home 4L O2 at baseline, small cell lung cancer, tobacco abuse, with recent admission for acute on chronic hypoxic respiratory failure presents with chief complaint of worsening oxygen saturation needing 5L O2 [place] instead of 4L O2, as well as cough.  She also admitted to diarrhea at home.  In the emergency department, work-up revealed ill-defined opacity right base likely a pneumonia.  Patient was admitted for HCAP and started on empiric antibiotics.  She is remained afebrile, continue to improve clinically.  She was maintained on 4 to 5 L of nasal cannula O2 during hospitalization.  On day of discharge, she stated that her breathing was much better, diarrhea improved as well.  She was eager to go home with her husband.  Discharge Diagnoses:   HCAP with acute on chronic hypoxemic respiratory failure -Sepsis ruled out. Pneumonia present on admission -SARS-CoV-2 negative -Blood cultures negative to date -Continue to wean down nasal cannula O2 to baseline 4 L as able  -Continue cefepime. MRSA PCR if negative, discontinue vancomycin -De-escalate to Levaquin on discharge  Diarrhea -GI PCR negative  Small cell lung cancer -Under care of Dr. Julien Nordmann  Paroxysmal atrial flutter status post ablation in  2018 -Continue Cardizem, metoprolol.  Patient is not on anticoagulation at this time -Follows with Dr. Lovena Le  Chronic diastolic heart failure -Continue torsemide, metoprolol -Does not appear to be in acute exacerbation at this time.  BNP 67  GERD -Continue PPI  Depression, anxiety -Continue Cymbalta, Seroquel, Xanax  HLD -Continue lipitor    Hypokalemia -Replace, trend  Discharge Instructions  Discharge Instructions    Call MD for:  difficulty breathing, headache or visual disturbances   Complete by: As directed    Call MD for:  extreme fatigue   Complete by: As directed    Call MD for:  hives   Complete by: As directed    Call MD for:  persistant dizziness or light-headedness   Complete by: As directed    Call MD for:  persistant nausea and vomiting   Complete by: As directed    Call MD for:  severe uncontrolled pain   Complete by: As directed    Call MD for:  temperature >100.4   Complete by: As directed    Diet - low sodium heart healthy   Complete by: As directed    Discharge instructions   Complete by: As directed    You were cared for by a hospitalist during your hospital stay. If you have any questions about your discharge medications or the care you received while you were in the hospital after you are discharged, you can call the unit and ask to speak with the hospitalist on call if the hospitalist that took care of you is not available. Once you are discharged, your primary care physician will handle any further medical  issues. Please note that NO REFILLS for any discharge medications will be authorized once you are discharged, as it is imperative that you return to your primary care physician (or establish a relationship with a primary care physician if you do not have one) for your aftercare needs so that they can reassess your need for medications and monitor your lab values.   Increase activity slowly   Complete by: As directed      Allergies as of  07/06/2019      Reactions   Morphine And Related Other (See Comments)   hallucinations      Medication List    STOP taking these medications   Dexlansoprazole 30 MG capsule Commonly known as: Dexilant   diltiazem 120 MG 24 hr capsule Commonly known as: CARDIZEM CD   predniSONE 10 MG tablet Commonly known as: DELTASONE     TAKE these medications   ALPRAZolam 0.5 MG tablet Commonly known as: XANAX Take 1 tablet (0.5 mg total) by mouth 2 (two) times daily.   atorvastatin 80 MG tablet Commonly known as: LIPITOR TAKE 1 TABLET(80 MG) BY MOUTH DAILY What changed: See the new instructions.   cycloSPORINE 0.05 % ophthalmic emulsion Commonly known as: RESTASIS Place 1 drop into both eyes 2 (two) times daily.   DULoxetine 60 MG capsule Commonly known as: CYMBALTA Take 1 capsule (60 mg total) by mouth daily.   HYDROcodone-acetaminophen 5-325 MG tablet Commonly known as: NORCO/VICODIN Take 1 tablet by mouth every 6 (six) hours as needed for moderate pain.   ipratropium-albuterol 0.5-2.5 (3) MG/3ML Soln Commonly known as: DUONEB Take 3 mLs by nebulization every 4 (four) hours as needed (shortness of breath or wheezing).   levofloxacin 750 MG tablet Commonly known as: Levaquin Take 1 tablet (750 mg total) by mouth daily for 5 days.   loratadine 10 MG tablet Commonly known as: CLARITIN Take 10 mg by mouth daily.   magnesium oxide 400 (241.3 Mg) MG tablet Commonly known as: MAG-OX Take 1 tablet (400 mg total) by mouth 2 (two) times daily.   metoCLOPramide 5 MG tablet Commonly known as: Reglan Take 1 tablet (5 mg total) by mouth 3 (three) times daily before meals.   metoprolol tartrate 25 MG tablet Commonly known as: LOPRESSOR Take 0.5 tablets (12.5 mg total) by mouth 2 (two) times daily.   multivitamin with minerals Tabs tablet Take 1 tablet by mouth daily.   ondansetron 4 MG tablet Commonly known as: ZOFRAN TAKE 1 TABLET(4 MG) BY MOUTH TWICE DAILY AS NEEDED FOR  NAUSEA OR VOMITING What changed: See the new instructions.   potassium chloride 10 MEQ tablet Commonly known as: K-DUR Take 1 tablet (10 mEq total) by mouth 2 (two) times daily.   prochlorperazine 10 MG tablet Commonly known as: COMPAZINE TAKE 1 TABLET(10 MG) BY MOUTH EVERY 6 HOURS AS NEEDED FOR NAUSEA OR VOMITING What changed: See the new instructions.   QUEtiapine 100 MG tablet Commonly known as: SEROQUEL Take 1.5 tablets (150 mg total) by mouth at bedtime.   rOPINIRole 0.25 MG tablet Commonly known as: REQUIP Take 2 tablets (0.5 mg total) by mouth at bedtime.   torsemide 20 MG tablet Commonly known as: DEMADEX Take 1 tablet (20 mg total) by mouth as needed. What changed: reasons to take this   Trelegy Ellipta 100-62.5-25 MCG/INH Aepb Generic drug: Fluticasone-Umeclidin-Vilant Inhale 1 puff into the lungs daily.   Ventolin HFA 108 (90 Base) MCG/ACT inhaler Generic drug: albuterol INHALE 2 PUFFS INTO THE LUNGS EVERY  6 HOURS AS NEEDED FOR WHEEZING OR SHORTNESS OF BREATH What changed: See the new instructions.   Vitamin D 50 MCG (2000 UT) Caps Take 2,000 Units by mouth daily.      Follow-up Information    Martinique, [name], MD. Schedule an appointment as soon as possible for a visit in 1 week(s).   Specialty: Family Medicine Contact information: [address] [place] [address] [phone]          Allergies  Allergen Reactions  . Morphine And Related Other (See Comments)    hallucinations    Consultations:  None   Procedures/Studies: Dg Lumbar Spine Complete  Result Date: 07/03/2019 CLINICAL DATA:  Low back pain EXAM: LUMBAR SPINE - COMPLETE 4+ VIEW COMPARISON:  January 27, 2018 FINDINGS: There is no acute displaced fracture. There are multilevel degenerative changes throughout the lumbar spine, greatest at the lower lumbar segments. There is multilevel facet arthrosis. Vascular calcifications are noted. IMPRESSION: No acute osseous  abnormality.  Degenerative changes as above. Electronically Signed   By: Constance Holster M.D.   On: 07/03/2019 18:57   Dg Chest Port 1 View  Result Date: 07/03/2019 CLINICAL DATA:  Shortness of breath EXAM: PORTABLE CHEST 1 VIEW COMPARISON:  June 12, 2019 FINDINGS: Postoperative changes noted on the right with volume loss. There is ill-defined opacity in the lateral right base with small right pleural effusion. Left lung is clear. Heart size and pulmonary vascularity are normal. No adenopathy evident. There is aortic atherosclerosis. No bone lesions. IMPRESSION: Area of ill-defined opacity lateral right base, likely pneumonia. Small right pleural effusion. Postoperative change with volume loss and scarring on the right. Left lung clear. Stable cardiac silhouette. No adenopathy evident. Aortic Atherosclerosis (ICD10-I70.0). Followup PA and lateral chest radiographs recommended in 3-4 weeks following trial of antibiotic therapy to ensure resolution and exclude underlying malignancy. Electronically Signed   By: Lowella Grip III M.D.   On: 07/03/2019 18:27    Discharge Exam: Vitals:   07/05/19 2104 07/06/19 0516  BP: 98/65 104/62  Pulse: 72 80  Resp: 18 16  Temp: 97.7 F (36.5 C) 98.4 F (36.9 C)  SpO2: 97% 94%     General: Pt is alert, awake, not in acute distress Cardiovascular: RRR, S1/S2 +, no rubs, no gallops Respiratory: CTA bilaterally, no wheezing, no rhonchi, no respiratory distress, no conversational dyspnea Abdominal: Soft, NT, ND, bowel sounds + Extremities: no edema, no cyanosis    The results of significant diagnostics from this hospitalization (including imaging, microbiology, ancillary and laboratory) are listed below for reference.     Microbiology: Recent Results (from the past 240 hour(s))  Urine culture     Status: Abnormal   Collection Time: 07/03/19  5:27 PM   Specimen: Urine, Clean Catch  Result Value Ref Range Status   Specimen Description   Final     URINE, CLEAN CATCH Performed at [facility], [place] [address] [place], [place] [address]    Special Requests   Final    NONE Performed at [facility], [place] [address], [place], [place] [address]    Culture (A)  Final    <10,000 COLONIES/mL INSIGNIFICANT GROWTH Performed at [facility] [address], [place], [place] [address]    Report Status 07/06/2019 FINAL  Final  SARS Coronavirus 2 [facility] order, Performed in [facility] hospital lab) Nasopharyngeal Nasopharyngeal Swab     Status: None   Collection Time: 07/03/19  6:48 PM  Specimen: Nasopharyngeal Swab  Result Value Ref Range Status   SARS Coronavirus 2 NEGATIVE NEGATIVE Final    Comment: (NOTE) If result is NEGATIVE SARS-CoV-2 target nucleic acids are NOT DETECTED. The SARS-CoV-2 RNA is generally detectable in upper and lower  respiratory specimens during the acute phase of infection. The lowest  concentration of SARS-CoV-2 viral copies this assay can detect is 250  copies / mL. A negative result does not preclude SARS-CoV-2 infection  and should not be used as the sole basis for treatment or other  patient management decisions.  A negative result may occur with  improper specimen collection / handling, submission of specimen other  than nasopharyngeal swab, presence of viral mutation(s) within the  areas targeted by this assay, and inadequate number of viral copies  (<250 copies / mL). A negative result must be combined with clinical  observations, patient history, and epidemiological information. If result is POSITIVE SARS-CoV-2 target nucleic acids are DETECTED. The SARS-CoV-2 RNA is generally detectable in upper and lower  respiratory specimens dur ing the acute phase of infection.  Positive  results are indicative of active infection with SARS-CoV-2.  Clinical  correlation with patient history and other diagnostic information is  necessary to determine patient infection  status.  Positive results do  not rule out bacterial infection or co-infection with other viruses. If result is PRESUMPTIVE POSTIVE SARS-CoV-2 nucleic acids MAY BE PRESENT.   A presumptive positive result was obtained on the submitted specimen  and confirmed on repeat testing.  While 2019 novel coronavirus  (SARS-CoV-2) nucleic acids may be present in the submitted sample  additional confirmatory testing may be necessary for epidemiological  and / or clinical management purposes  to differentiate between  SARS-CoV-2 and other Sarbecovirus currently known to infect humans.  If clinically indicated additional testing with an alternate test  methodology [phone]) is advised. The SARS-CoV-2 RNA is generally  detectable in upper and lower respiratory sp ecimens during the acute  phase of infection. The expected result is Negative. Fact Sheet for Patients:  StrictlyIdeas.no Fact Sheet for Healthcare Providers: BankingDealers.co.za This test is not yet approved or cleared by the [place] FDA and has been authorized for detection and/or diagnosis of SARS-CoV-2 by FDA under an Emergency Use Authorization (EUA).  This EUA will remain in effect (meaning this test can be used) for the duration of the COVID-19 declaration under Section 564(b)(1) of the Act, 21 U.S.C. section 360bbb-3(b)(1), unless the authorization is terminated or revoked sooner. Performed at [facility], [place] [address], [place], [place] [address]   Culture, blood (routine x 2)     Status: None (Preliminary result)   Collection Time: 07/03/19  6:50 PM   Specimen: BLOOD RIGHT HAND  Result Value Ref Range Status   Specimen Description   Final    BLOOD RIGHT HAND Performed at [facility] [address], [place], [place] [address]    Special Requests   Final    BOTTLES DRAWN AEROBIC AND ANAEROBIC Blood Culture results may not be optimal  due to an inadequate volume of blood received in culture bottles Performed at [facility] [address], [place], [place] [address]    Culture   Final    NO GROWTH 3 DAYS Performed at [facility], [place] [address] [place], [place] [address]    Report Status PENDING  Incomplete  Culture, blood (routine x 2)     Status: None (Preliminary result)  Collection Time: 07/03/19  6:55 PM   Specimen: BLOOD  Result Value Ref Range Status   Specimen Description   Final    BLOOD LEFT ANTECUBITAL Performed at [facility], [place] [address] [place], [place] [address]    Special Requests   Final    BOTTLES DRAWN AEROBIC ONLY Blood Culture adequate volume Performed at [employer] [address] [place], [place] [address]    Culture   Final    NO GROWTH 3 DAYS Performed at [facility], [place] [address], [place], [place] [address]    Report Status PENDING  Incomplete  Gastrointestinal Panel by PCR , Stool     Status: None   Collection Time: 07/04/19  5:38 AM   Specimen: STOOL  Result Value Ref Range Status   Campylobacter species NOT DETECTED NOT DETECTED Final   Plesimonas shigelloides NOT DETECTED NOT DETECTED Final   Salmonella species NOT DETECTED NOT DETECTED Final   Yersinia enterocolitica NOT DETECTED NOT DETECTED Final   Vibrio species NOT DETECTED NOT DETECTED Final   Vibrio cholerae NOT DETECTED NOT DETECTED Final   Enteroaggregative E coli (EAEC) NOT DETECTED NOT DETECTED Final   Enteropathogenic E coli (EPEC) NOT DETECTED NOT DETECTED Final   Enterotoxigenic E coli (ETEC) NOT DETECTED NOT DETECTED Final   Shiga like toxin producing E coli (STEC) NOT DETECTED NOT DETECTED Final   Shigella/Enteroinvasive E coli (EIEC) NOT DETECTED NOT DETECTED Final   Cryptosporidium NOT DETECTED NOT DETECTED Final   Cyclospora cayetanensis NOT DETECTED NOT DETECTED Final   Entamoeba histolytica NOT DETECTED NOT DETECTED Final   Giardia  lamblia NOT DETECTED NOT DETECTED Final   Adenovirus F40/41 NOT DETECTED NOT DETECTED Final   Astrovirus NOT DETECTED NOT DETECTED Final   Norovirus GI/GII NOT DETECTED NOT DETECTED Final   Rotavirus A NOT DETECTED NOT DETECTED Final   Sapovirus (I, II, IV, and V) NOT DETECTED NOT DETECTED Final    Comment: Performed at [facility], [place] [place], [place] [address]  MRSA PCR Screening     Status: None   Collection Time: 07/04/19  1:00 PM   Specimen: Nasopharyngeal  Result Value Ref Range Status   MRSA by PCR NEGATIVE NEGATIVE Final    Comment:        The GeneXpert MRSA Assay (FDA approved for NASAL specimens only), is one component of a comprehensive MRSA colonization surveillance program. It is not intended to diagnose MRSA infection nor to guide or monitor treatment for MRSA infections. Performed at [facility], [place] [address], [place], [place] [address]      Labs: BNP (last 3 results) Recent Labs    06/12/19 0845 07/03/19 1727  BNP 2,597.8* 09.3   Basic Metabolic Panel: Recent Labs  Lab 06/29/19 1431 07/03/19 1727 07/04/19 0847 07/05/19 0419 07/06/19 0400  NA 137 137 138 141 140  K 4.5 4.2 3.2* 3.2* 3.2*  CL 89* 88* 93* 96* 95*  CO2 34* 38* 33* 34* 36*  GLUCOSE 132* 143* 134* 95 104*  BUN 28* 25* 15 17 18   CREATININE 0.80 0.82 0.53 0.57 0.59  CALCIUM 9.7 9.0 9.1 8.7* 8.9  MG 1.4* 2.2  --  2.3  --    Liver Function Tests: Recent Labs  Lab 06/29/19 1431 07/03/19 1727  AST 21 24  ALT 26 36  ALKPHOS 108 103  BILITOT 0.2* 0.7  PROT 6.7 6.0*  ALBUMIN 3.7 3.2*   Recent Labs  Lab 07/03/19 1727  LIPASE 19   No results for input(s): AMMONIA in the last 168 hours. CBC: Recent Labs  Lab 06/29/19 1431 07/03/19 1727 07/04/19 0847 07/05/19 0419 07/06/19 0400  WBC 33.1* 21.5* 16.4* 10.4 10.0  NEUTROABS 24.3* 17.0*  --   --   --   HGB 10.8* 9.0* 8.8* 7.6* 7.8*  HCT 33.0* 28.4* 27.4* 24.6* 24.8*  MCV 90.2 95.0  94.5 95.7 95.0  PLT 280 263 244 220 225   Cardiac Enzymes: No results for input(s): CKTOTAL, CKMB, CKMBINDEX, TROPONINI in the last 168 hours. BNP: Invalid input(s): POCBNP CBG: Recent Labs  Lab 07/03/19 1647  GLUCAP 161*   D-Dimer No results for input(s): DDIMER in the last 72 hours. Hgb A1c No results for input(s): HGBA1C in the last 72 hours. Lipid Profile No results for input(s): CHOL, HDL, LDLCALC, TRIG, CHOLHDL, LDLDIRECT in the last 72 hours. Thyroid function studies No results for input(s): TSH, T4TOTAL, T3FREE, THYROIDAB in the last 72 hours.  Invalid input(s): FREET3 Anemia work up No results for input(s): VITAMINB12, FOLATE, FERRITIN, TIBC, IRON, RETICCTPCT in the last 72 hours. Urinalysis    Component Value Date/Time   COLORURINE YELLOW 07/03/2019 1727   APPEARANCEUR CLEAR 07/03/2019 1727   LABSPEC 1.012 07/03/2019 1727   PHURINE 5.0 07/03/2019 1727   GLUCOSEU NEGATIVE 07/03/2019 1727   GLUCOSEU NEGATIVE 08/04/2018 1721   HGBUR NEGATIVE 07/03/2019 1727   BILIRUBINUR NEGATIVE 07/03/2019 1727   KETONESUR NEGATIVE 07/03/2019 1727   PROTEINUR NEGATIVE 07/03/2019 1727   UROBILINOGEN 0.2 08/04/2018 1721   NITRITE NEGATIVE 07/03/2019 1727   LEUKOCYTESUR NEGATIVE 07/03/2019 1727   Sepsis Labs Invalid input(s): PROCALCITONIN,  WBC,  LACTICIDVEN Microbiology Recent Results (from the past 240 hour(s))  Urine culture     Status: Abnormal   Collection Time: 07/03/19  5:27 PM   Specimen: Urine, Clean Catch  Result Value Ref Range Status   Specimen Description   Final    URINE, CLEAN CATCH Performed at [facility], [place] [address], [place], [place] [address]    Special Requests   Final    NONE Performed at [facility], [place] [address] [place], [place] [address]    Culture (A)  Final    <10,000 COLONIES/mL INSIGNIFICANT GROWTH Performed at [facility] [address], [place], [place] [address]    Report Status  07/06/2019 FINAL  Final  SARS Coronavirus 2 [facility] order, Performed in [facility] hospital lab) Nasopharyngeal Nasopharyngeal Swab     Status: None   Collection Time: 07/03/19  6:48 PM   Specimen: Nasopharyngeal Swab  Result Value Ref Range Status   SARS Coronavirus 2 NEGATIVE NEGATIVE Final    Comment: (NOTE) If result is NEGATIVE SARS-CoV-2 target nucleic acids are NOT DETECTED. The SARS-CoV-2 RNA is generally detectable in upper and lower  respiratory specimens during the acute phase of infection. The lowest  concentration of SARS-CoV-2 viral copies this assay can detect is 250  copies / mL. A negative result does not preclude SARS-CoV-2 infection  and should not be used as the sole basis for treatment or other  patient management decisions.  A negative result may occur with  improper specimen collection / handling, submission of specimen other  than nasopharyngeal swab, presence of viral mutation(s) within the  areas targeted by this assay, and inadequate number of viral copies  (<250 copies / mL). A negative result must be combined with clinical  observations, patient history, and epidemiological information.  If result is POSITIVE SARS-CoV-2 target nucleic acids are DETECTED. The SARS-CoV-2 RNA is generally detectable in upper and lower  respiratory specimens dur ing the acute phase of infection.  Positive  results are indicative of active infection with SARS-CoV-2.  Clinical  correlation with patient history and other diagnostic information is  necessary to determine patient infection status.  Positive results do  not rule out bacterial infection or co-infection with other viruses. If result is PRESUMPTIVE POSTIVE SARS-CoV-2 nucleic acids MAY BE PRESENT.   A presumptive positive result was obtained on the submitted specimen  and confirmed on repeat testing.  While 2019 novel coronavirus  (SARS-CoV-2) nucleic acids may be present in the submitted sample  additional  confirmatory testing may be necessary for epidemiological  and / or clinical management purposes  to differentiate between  SARS-CoV-2 and other Sarbecovirus currently known to infect humans.  If clinically indicated additional testing with an alternate test  methodology [phone]) is advised. The SARS-CoV-2 RNA is generally  detectable in upper and lower respiratory sp ecimens during the acute  phase of infection. The expected result is Negative. Fact Sheet for Patients:  StrictlyIdeas.no Fact Sheet for Healthcare Providers: BankingDealers.co.za This test is not yet approved or cleared by the [place] FDA and has been authorized for detection and/or diagnosis of SARS-CoV-2 by FDA under an Emergency Use Authorization (EUA).  This EUA will remain in effect (meaning this test can be used) for the duration of the COVID-19 declaration under Section 564(b)(1) of the Act, 21 U.S.C. section 360bbb-3(b)(1), unless the authorization is terminated or revoked sooner. Performed at [facility], [place] [address], [place], [place] [address]   Culture, blood (routine x 2)     Status: None (Preliminary result)   Collection Time: 07/03/19  6:50 PM   Specimen: BLOOD RIGHT HAND  Result Value Ref Range Status   Specimen Description   Final    BLOOD RIGHT HAND Performed at [facility] [address] [place], [place] [address]    Special Requests   Final    BOTTLES DRAWN AEROBIC AND ANAEROBIC Blood Culture results may not be optimal due to an inadequate volume of blood received in culture bottles Performed at [facility] [address] [place], [place] [address]    Culture   Final    NO GROWTH 3 DAYS Performed at [facility], [place] [address] [place], [place] [address]    Report Status PENDING  Incomplete  Culture, blood (routine x 2)     Status: None (Preliminary result)    Collection Time: 07/03/19  6:55 PM   Specimen: BLOOD  Result Value Ref Range Status   Specimen Description   Final    BLOOD LEFT ANTECUBITAL Performed at [facility], [place] [address], [place], [place] [address]    Special Requests   Final    BOTTLES DRAWN AEROBIC ONLY Blood Culture adequate volume Performed at [facility] [address], [place], [place] [address]    Culture   Final    NO GROWTH 3 DAYS Performed at [facility], [place] [address] [place],  [address]    Report Status PENDING  Incomplete  Gastrointestinal Panel by PCR , Stool     Status: None   Collection Time: 07/04/19  5:38 AM   Specimen: STOOL  Result Value Ref Range Status   Campylobacter species NOT DETECTED NOT DETECTED Final   Plesimonas shigelloides NOT  DETECTED NOT DETECTED Final   Salmonella species NOT DETECTED NOT DETECTED Final   Yersinia enterocolitica NOT DETECTED NOT DETECTED Final   Vibrio species NOT DETECTED NOT DETECTED Final   Vibrio cholerae NOT DETECTED NOT DETECTED Final   Enteroaggregative E coli (EAEC) NOT DETECTED NOT DETECTED Final   Enteropathogenic E coli (EPEC) NOT DETECTED NOT DETECTED Final   Enterotoxigenic E coli (ETEC) NOT DETECTED NOT DETECTED Final   Shiga like toxin producing E coli (STEC) NOT DETECTED NOT DETECTED Final   Shigella/Enteroinvasive E coli (EIEC) NOT DETECTED NOT DETECTED Final   Cryptosporidium NOT DETECTED NOT DETECTED Final   Cyclospora cayetanensis NOT DETECTED NOT DETECTED Final   Entamoeba histolytica NOT DETECTED NOT DETECTED Final   Giardia lamblia NOT DETECTED NOT DETECTED Final   Adenovirus F40/41 NOT DETECTED NOT DETECTED Final   Astrovirus NOT DETECTED NOT DETECTED Final   Norovirus GI/GII NOT DETECTED NOT DETECTED Final   Rotavirus A NOT DETECTED NOT DETECTED Final   Sapovirus (I, II, IV, and V) NOT DETECTED NOT DETECTED Final    Comment: Performed at [facility], [address] [place],  [place] [address]  MRSA PCR Screening     Status: None   Collection Time: 07/04/19  1:00 PM   Specimen: Nasopharyngeal  Result Value Ref Range Status   MRSA by PCR NEGATIVE NEGATIVE Final    Comment:        The GeneXpert MRSA Assay (FDA approved for NASAL specimens only), is one component of a comprehensive MRSA colonization surveillance program. It is not intended to diagnose MRSA infection nor to guide or monitor treatment for MRSA infections. Performed at [facility], [place] [address], [place], [place] [address]      Patient was seen and examined on the day of discharge and was found to be in stable condition. Time coordinating discharge: 25 minutes including assessment and coordination of care, as well as examination of the patient.   SIGNED:  Dessa Phi, DO [facility] [email or web address] 07/06/2019, 11:57 AM

## 2019-07-06 NOTE — Plan of Care (Signed)
  Problem: Education: Goal: Knowledge of General Education information will improve Description: Including pain rating scale, medication(s)/side effects and non-pharmacologic comfort measures Outcome: Completed/Met

## 2019-07-06 NOTE — Progress Notes (Signed)
Pt discharged to home with husband instruction reviewed acknowledged understanding.SRP, RN

## 2019-07-06 NOTE — Progress Notes (Signed)
Pharmacy Antibiotic Note  Tonya Bradley is a 68 y.o. female admitted on 07/03/2019 with pneumonia.  PMH significant for CHF, COPD, and lung cancer.  Pharmacy has been consulted for Vancomycin and Cefepime dosing.  Plan:  Cefepime 2gm IV q12h  Discharge planning today, plan Levaquin at discharge  Height: 5\' 2"  (157.5 cm) Weight: 139 lb 1.6 oz (63.1 kg) IBW/kg (Calculated) : 50.1  Temp (24hrs), Avg:98.1 F (36.7 C), Min:97.7 F (36.5 C), Max:98.4 F (36.9 C)  Recent Labs  Lab 06/29/19 1431 07/03/19 1727 07/03/19 1728 07/04/19 0847 07/05/19 0419 07/06/19 0400  WBC 33.1* 21.5*  --  16.4* 10.4 10.0  CREATININE 0.80 0.82  --  0.53 0.57 0.59  LATICACIDVEN  --   --  1.9  --   --   --     Estimated Creatinine Clearance: 57.9 mL/min (by C-G formula based on SCr of 0.59 mg/dL).    Allergies  Allergen Reactions  . Morphine And Related Other (See Comments)    hallucinations    Antimicrobials this admission: 8/1 Vanc >> 8/2 8/1 Cefepime >>    Dose adjustments this admission:   Microbiology results: 8/1 BCx: ngtd 8/1 UCx: sent 8/1 Legionella: in process 8/1 Strep pneumo: neg 8/1 SARS 2: neg 8/2 MRSA PCR: neg 8/2 GI panel: neg   Thank you for allowing pharmacy to be a part of this patient's care.  Minda Ditto, PharmD 07/06/2019 8:32 AM

## 2019-07-06 NOTE — Care Management Important Message (Signed)
Important Message  Patient Details IM Letter given to Nancy Marus RN to present to the Patient Name: Tonya Bradley MRN: [id number] Date of Birth: May 03, 1951   Medicare Important Message Given:  Yes     Kerin Salen 07/06/2019, 10:13 AM

## 2019-07-07 ENCOUNTER — Ambulatory Visit: Payer: Medicare HMO | Admitting: Cardiology

## 2019-07-07 ENCOUNTER — Other Ambulatory Visit: Payer: Self-pay | Admitting: *Deleted

## 2019-07-07 ENCOUNTER — Other Ambulatory Visit: Payer: Self-pay

## 2019-07-07 DIAGNOSIS — I5032 Chronic diastolic (congestive) heart failure: Secondary | ICD-10-CM | POA: Diagnosis not present

## 2019-07-07 DIAGNOSIS — I110 Hypertensive heart disease with heart failure: Secondary | ICD-10-CM | POA: Diagnosis not present

## 2019-07-07 DIAGNOSIS — J438 Other emphysema: Secondary | ICD-10-CM | POA: Diagnosis not present

## 2019-07-07 DIAGNOSIS — C3491 Malignant neoplasm of unspecified part of right bronchus or lung: Secondary | ICD-10-CM | POA: Diagnosis not present

## 2019-07-07 DIAGNOSIS — I4892 Unspecified atrial flutter: Secondary | ICD-10-CM | POA: Diagnosis not present

## 2019-07-07 DIAGNOSIS — I2510 Atherosclerotic heart disease of native coronary artery without angina pectoris: Secondary | ICD-10-CM | POA: Diagnosis not present

## 2019-07-07 DIAGNOSIS — H269 Unspecified cataract: Secondary | ICD-10-CM | POA: Diagnosis not present

## 2019-07-07 DIAGNOSIS — J9611 Chronic respiratory failure with hypoxia: Secondary | ICD-10-CM | POA: Diagnosis not present

## 2019-07-07 DIAGNOSIS — D630 Anemia in neoplastic disease: Secondary | ICD-10-CM | POA: Diagnosis not present

## 2019-07-07 LAB — LEGIONELLA PNEUMOPHILA SEROGP 1 UR AG: L. pneumophila Serogp 1 Ur Ag: NEGATIVE

## 2019-07-07 NOTE — Patient Outreach (Signed)
[facility]) Care Management  07/07/2019  Tonya Bradley 07-22-1951 [id number]    Referral received 07/07/2019 Initial Outreach 07/07/2019 Provider office to complete Transition of care-Note RN contacted providers office verify [facility] with PCP  RN spoke with pt and introduced the [facility] services and purpose for today's call. Pt receptive and spoke in detail concerning her recent discharge. RN able to review pt's discharge sheet along with all her medications. Pt reports her spouse has spoken with her primary provider today pending a follow up appointment on Friday. RN stress the importance of obtaining any needed refills or clarify on existing medication to continue. RN further engage on pt's medical issues related to HF. Pt states she weights daily with a weight all week of 137 lbs with some swelling to her LL (provider is aware) but no distressful breathing and this is an improvement from her previous issues with swelling.  RN discussed and educated on HF zones and verified pt is in the GREEN zone. Also explained why this is importance and when to seek medical attention and/or contact her provider with fluid retention of 3 lbs overnight or 5 lbs within one week. Pt verbalized an understanding and denies any symptoms related at this time. RN offered enrollment for ongoing communication related to managing her HF with printed material that can be mailed to pt along with telephone calls over the next few months (pt agreed).   RN generated a plan of care with goals and interventions. Discussed increased her knowledge based concerning HF and the importance of heart healthy diet with low salt. Along with daily weights stressing the importance on when to contact her provider with 3 lbs over night or 5 lbs within one week with any precipitating symptoms. Pt receptive and appreciative for the information and services offered today. Will communicate with pt's primary provider on pt's disposition with  THN.  THN CM Care Plan Problem One     Most Recent Value  Care Plan Problem One  Deficient Knowledge Related to CHF unfamiliarity with information/education  Role Documenting the Problem One  Care Management Coordinator  Care Plan for Problem One  Active  THN Long Term Goal   Pt will verbalize the plan of action in the GREEN zone within the next 90 day.  THN Long Term Goal Start Date  07/07/19  Interventions for Problem One Long Term Goal  Discussed the HF zones and strongly encouraged pt to review the printed material via EMMI that will be mailed. Will also educate on the when to contact her provider with fuild retention and other symptoms that maybe encountered. Will continue to evaluate pt's increase knowledge based concerning HF management  THN CM Short Term Goal #1   Pt will keep all medical appointments scheduled within the next 30 days.  THN CM Short Term Goal #1 Start Date  07/07/19  Interventions for Short Term Goal #1  Will review all scheduled appointments and verify pt has sufficient transportaion to all appointments. Will also offer alterative resources if pt needs additional transport needs.  THN CM Short Term Goal #2   Pt will administer all prescribed medications to prevent acute events within the next 30 days.  THN CM Short Term Goal #2 Start Date  07/07/19  Interventions for Short Term Goal #2  Review and discussed all medications and strongly encouraged pt on her upcoming medical appointments to request the refill prescriptions needed to continue all her prescribed medications. Will review this next week after  pt's pending appointments with her provider.       Raina Mina, RN Care Management Coordinator [place] Office [phone]

## 2019-07-07 NOTE — Telephone Encounter (Signed)
Spoke with Stanton Kidney and gave verbal orders as requested for OT.

## 2019-07-08 DIAGNOSIS — J449 Chronic obstructive pulmonary disease, unspecified: Secondary | ICD-10-CM | POA: Diagnosis not present

## 2019-07-08 LAB — CULTURE, BLOOD (ROUTINE X 2)
Culture: NO GROWTH
Culture: NO GROWTH
Special Requests: ADEQUATE

## 2019-07-09 ENCOUNTER — Other Ambulatory Visit: Payer: Self-pay

## 2019-07-09 ENCOUNTER — Encounter: Payer: Self-pay | Admitting: Family Medicine

## 2019-07-09 ENCOUNTER — Telehealth: Payer: Self-pay | Admitting: *Deleted

## 2019-07-09 ENCOUNTER — Ambulatory Visit (INDEPENDENT_AMBULATORY_CARE_PROVIDER_SITE_OTHER): Payer: Medicare HMO | Admitting: Family Medicine

## 2019-07-09 VITALS — BP 84/50 | HR 74 | Resp 20

## 2019-07-09 DIAGNOSIS — J181 Lobar pneumonia, unspecified organism: Secondary | ICD-10-CM | POA: Diagnosis not present

## 2019-07-09 DIAGNOSIS — D649 Anemia, unspecified: Secondary | ICD-10-CM

## 2019-07-09 DIAGNOSIS — G894 Chronic pain syndrome: Secondary | ICD-10-CM | POA: Diagnosis not present

## 2019-07-09 DIAGNOSIS — M48062 Spinal stenosis, lumbar region with neurogenic claudication: Secondary | ICD-10-CM

## 2019-07-09 DIAGNOSIS — I959 Hypotension, unspecified: Secondary | ICD-10-CM

## 2019-07-09 DIAGNOSIS — E876 Hypokalemia: Secondary | ICD-10-CM | POA: Diagnosis not present

## 2019-07-09 DIAGNOSIS — F419 Anxiety disorder, unspecified: Secondary | ICD-10-CM

## 2019-07-09 MED ORDER — ALPRAZOLAM 0.5 MG PO TABS: 0.5000 mg | ORAL_TABLET | Freq: Two times a day (BID) | ORAL | 2 refills | Status: AC

## 2019-07-09 NOTE — Progress Notes (Signed)
Virtual Visit via Video Note   I connected with Tonya Bradley and her husband on 07/09/19 by a video enabled telemedicine application and verified that I am speaking with the correct person using two identifiers.  Location patient: home Location provider:work office Persons participating in the virtual visit: patient, provider  I discussed the limitations of evaluation and management by telemedicine and the availability of in person appointments. The patient expressed understanding and agreed to proceed.   HPI: Tonya Bradley is a 68 yo female with Hx of anxiety,chronic pain,HTN,and non small cell lung cancer who was recently discharged from the hospital. [facility] call 07/07/19.  She was last seen on 06/29/19, when we adjusted some of her medications.  She was admitted on 07/03/2019 and discharged home on 07/06/2019. She presented to the ER because worsening dyspnea and increased in O2 supplementation requirements.  Dx with right basilar pneumonia. She was discharged on Levaquin 750 mg to complete 5 days. Tolerating medication well.  COPD,she is on Trelegy Ellipta 100-62.5-25 mg 1 puff daily and Albuterol inh. On continues supplemental O2 5 LPM. Exertional dyspnea. "Little" cough and has not noted wheezing.  No other medications were changed during hospitalization.  Nurse visits to be started once per week. PT evaluation pending.  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 "Very weak", so chemo was stopped, according to her husband. Chest CT scheduled 07/30/19.  Sleeps most of the time.  Denies fever,chils, or sore throat.  Anemia. She is not on iron supplementation. Lab Results  Component Value Date   WBC 10.0 07/06/2019   HGB 7.8 (L) 07/06/2019   HCT 24.8 (L) 07/06/2019   MCV 95.0 07/06/2019   PLT 225 07/06/2019   HypoK+, she is on K-DUR 10 meq daily.  HTN, last visit Metoprolol dose was decreased from 25 mg bid to 12.5 mg bid. BP still running  in the 90's/50's. Negative for unusual headache,CP,or edema.  She has not taken Torsemide for the past couple days because LE edema has greatly improved.   Insomnia, currently she is on Seroquel 150 mg at bedtime, she feels like she is sleeping better.  Still waking up a couple times through the night but able to go back to sleep.  Last visit hydrocodone-acetaminophen 5-325 mg was started to help with back pain. She is taking medication twice daily, she is reporting that she is still having pain. Denies any side effect from medication. Tolerating medication well.  Anxiety,she is on Cymbalta 60 mg and Alprazolam 0.5 mg bid as needed. Requesting refills.  ROS: See pertinent positives and negatives per HPI.  Past Medical History:  Diagnosis Date  . Acute on chronic diastolic (congestive) heart failure ([place]) 06/12/2019  . AKI (acute kidney injury) ([place])   . Anemia   . Anxiety   . Arthritis    "back"  . Atrial flutter ([place]) 03/13/2016  . CAD (coronary artery disease)   . Cancer ([place])    skin cancer  . Cataract   . Colon polyp   . Complication of anesthesia    BP dropped during esophagus diltation in 2017-admitted for 3 days.   Marland Kitchen COPD (chronic obstructive pulmonary disease) ([place])   . Depression   . Dysrhythmia   . Emphysema of lung ([place])   . GERD (gastroesophageal reflux disease)   . Hypercholesterolemia   . Lung cancer ([place])   . Osteoporosis   . Oxygen deficiency     Past Surgical History:  Procedure Laterality Date  .  A-FLUTTER ABLATION N/A 05/19/2017   Procedure: A-Flutter Ablation;  Surgeon: Evans Lance, MD;  Location: [facility];  Service: Cardiovascular;  Laterality: N/A;  . APPENDECTOMY    . BASAL CELL CARCINOMA EXCISION    . CARDIAC CATHETERIZATION    . CATARACT EXTRACTION Bilateral   . CHOLECYSTECTOMY    . COLONOSCOPY N/A 06/23/2014   Dr. Rourk:Rectal and colonic polyps-removed, Pancolonic diverticulosis. lymphocytic colitis  . CORONARY ANGIOPLASTY  WITH STENT PLACEMENT    . ESOPHAGEAL DILATION N/A 03/13/2016   Procedure: ESOPHAGEAL DILATION;  Surgeon: Daneil Dolin, MD;  [facility];  Service: Endoscopy;  Laterality: N/A;  . ESOPHAGOGASTRODUODENOSCOPY N/A 03/13/2016   Procedure: ESOPHAGOGASTRODUODENOSCOPY (EGD);  Surgeon: Daneil Dolin, MD;  [facility];  Service: Endoscopy;  Laterality: N/A;  0930  . EYE SURGERY     cataract removed, bilateral  . IR THORACENTESIS ASP PLEURAL SPACE W/IMG GUIDE  02/15/2019  . LUMBAR LAMINECTOMY/DECOMPRESSION MICRODISCECTOMY N/A 01/19/2018   Procedure: L4-5 DECOMPRESSION;  Surgeon: Marybelle Killings, MD;  Location: [facility];  Service: Orthopedics;  Laterality: N/A;  . VIDEO ASSISTED THORACOSCOPY (VATS)/ LOBECTOMY Right 02/04/2019   Procedure: VIDEO ASSISTED THORACOSCOPY (VATS)/RIGHT UPPER AND MIDDLE LOBECTOMY;  Surgeon: Melrose Nakayama, MD;  Location: [facility];  Service: Thoracic;  Laterality: Right;  Marland Kitchen VIDEO BRONCHOSCOPY N/A 02/14/2019   Procedure: BRONCHOSCOPY WITH SEDATION;  Surgeon: Melrose Nakayama, MD;  Location[facility];  Service: Thoracic;  Laterality: N/A;    Family History  Problem Relation Age of Onset  . Breast cancer Sister 6  . Heart disease Sister   . Heart attack Father   . Heart disease Father   . Stroke Mother   . Heart disease Mother   . Hyperlipidemia Mother   . Heart attack Sister   . Heart disease Brother        ?heart failure  . Congestive Heart Failure Brother   . Hypertension Son   . Heart disease Maternal Grandfather   . Cancer Cousin        lymphoma  . Hypertension Son   . Stomach cancer Other   . Colon cancer Neg Hx   . Pancreatic cancer Neg Hx     Social History   Socioeconomic History  . Marital status: Married    Spouse name: Not on file  . Number of children: 3  . Years of education: 10  . Highest education level: Not on file  Occupational History  . Occupation: retired    Comment: customer service [employer]  .  Financial resource strain: Not on file  . Food insecurity    Worry: Not on file    Inability: Not on file  . Transportation needs    Medical: Not on file    Non-medical: Not on file  Tobacco Use  . Smoking status: Current Every Day Smoker    Packs/day: 0.25    Years: 54.00    Pack years: 13.50    Types: Cigarettes    Start date: 06/14/1966  . Smokeless tobacco: Never Used  . Tobacco comment: 1-2 packs daily. Chantix too expensive; pt states that she quit 2 weeks ago (02/04/19)   Substance and Sexual Activity  . Alcohol use: No    Alcohol/week: 0.0 standard drinks  . Drug use: No  . Sexual activity: Yes    Birth control/protection: Post-menopausal  Lifestyle  . Physical activity    Days per week: Not on file  Minutes per session: Not on file  . Stress: Not on file  Relationships  . Social Herbalist on phone: Not on file    Gets together: Not on file    Attends religious service: Not on file    Active member of club or organization: Not on file    Attends meetings of clubs or organizations: Not on file    Relationship status: Not on file  . Intimate partner violence    Fear of current or ex partner: Not on file    Emotionally abused: Not on file    Physically abused: Not on file    Forced sexual activity: Not on file  Other Topics Concern  . Not on file  Social History Narrative   Quit high school and got married in 10th grade, age 2   First husband died of colon cancer at young age   Married again 2018   Lives in own home   Daughter lives with her   Drinks coffee seldom, too much "sweet tea"     Current Outpatient Medications:  .  ALPRAZolam (XANAX) 0.5 MG tablet, Take 1 tablet (0.5 mg total) by mouth 2 (two) times daily., Disp: 60 tablet, Rfl: 2 .  atorvastatin (LIPITOR) 80 MG tablet, TAKE 1 TABLET(80 MG) BY MOUTH DAILY (Patient taking differently: Take 80 mg by mouth daily. ), Disp: 90 tablet, Rfl: 1 .  Cholecalciferol (VITAMIN D) 2000 units CAPS,  Take 2,000 Units by mouth daily., Disp: , Rfl:  .  cycloSPORINE (RESTASIS) 0.05 % ophthalmic emulsion, Place 1 drop into both eyes 2 (two) times daily., Disp: , Rfl:  .  DULoxetine (CYMBALTA) 60 MG capsule, Take 1 capsule (60 mg total) by mouth daily., Disp: 90 capsule, Rfl: 1 .  Fluticasone-Umeclidin-Vilant (TRELEGY ELLIPTA) 100-62.5-25 MCG/INH AEPB, Inhale 1 puff into the lungs daily., Disp: 60 each, Rfl: 3 .  HYDROcodone-acetaminophen (NORCO/VICODIN) 5-325 MG tablet, Take 1 tablet by mouth every 6 (six) hours as needed for moderate pain., Disp: 40 tablet, Rfl: 0 .  ipratropium-albuterol (DUONEB) 0.5-2.5 (3) MG/3ML SOLN, Take 3 mLs by nebulization every 4 (four) hours as needed (shortness of breath or wheezing)., Disp: 360 mL, Rfl: 0 .  levofloxacin (LEVAQUIN) 750 MG tablet, Take 1 tablet (750 mg total) by mouth daily for 5 days., Disp: 5 tablet, Rfl: 0 .  loratadine (CLARITIN) 10 MG tablet, Take 10 mg by mouth daily., Disp: , Rfl:  .  magnesium oxide (MAG-OX) 400 (241.3 Mg) MG tablet, Take 1 tablet (400 mg total) by mouth 2 (two) times daily., Disp: 60 tablet, Rfl: 1 .  metoCLOPramide (REGLAN) 5 MG tablet, Take 1 tablet (5 mg total) by mouth 3 (three) times daily before meals., Disp: 90 tablet, Rfl: 2 .  Multiple Vitamin (MULTIVITAMIN WITH MINERALS) TABS tablet, Take 1 tablet by mouth daily., Disp: , Rfl:  .  ondansetron (ZOFRAN) 4 MG tablet, TAKE 1 TABLET(4 MG) BY MOUTH TWICE DAILY AS NEEDED FOR NAUSEA OR VOMITING (Patient not taking: No sig reported), Disp: 40 tablet, Rfl: 0 .  potassium chloride (K-DUR) 10 MEQ tablet, Take 1 tablet (10 mEq total) by mouth 2 (two) times daily., Disp: 180 tablet, Rfl: 3 .  prochlorperazine (COMPAZINE) 10 MG tablet, TAKE 1 TABLET(10 MG) BY MOUTH EVERY 6 HOURS AS NEEDED FOR NAUSEA OR VOMITING (Patient not taking: No sig reported), Disp: 30 tablet, Rfl: 0 .  QUEtiapine (SEROQUEL) 100 MG tablet, Take 1.5 tablets (150 mg total) by mouth at bedtime., Disp:  45 tablet,  Rfl: 0 .  rOPINIRole (REQUIP) 0.25 MG tablet, Take 2 tablets (0.5 mg total) by mouth at bedtime., Disp: 180 tablet, Rfl: 0 .  torsemide (DEMADEX) 20 MG tablet, Take 1 tablet (20 mg total) by mouth as needed. (Patient not taking: Reported on 07/07/2019), Disp: 90 tablet, Rfl: 3 .  VENTOLIN HFA 108 (90 Base) MCG/ACT inhaler, INHALE 2 PUFFS INTO THE LUNGS EVERY 6 HOURS AS NEEDED FOR WHEEZING OR SHORTNESS OF BREATH (Patient taking differently: Inhale 2 puffs into the lungs every 6 (six) hours as needed for wheezing or shortness of breath. ), Disp: 18 g, Rfl: 0  EXAM:  VITALS per patient if applicable:BP (!) 35/57   Pulse 74   Resp 20   SpO2 (!) 89%   GENERAL: alert, oriented, appears well and in no acute distress  HEENT: atraumatic, conjunctiva clear, no obvious abnormalities on inspection of external nose and ears  LUNGS: on inspection no signs of respiratory distress, breathing rate appears normal, no obvious gross SOB, gasping or wheezing. On supplemental O2 per [place].  CV: no obvious cyanosis  PSYCH/NEURO: pleasant and cooperative, no obvious depression, +anxious..  ASSESSMENT AND PLAN:  Discussed the following assessment and plan:  Hypokalemia - Plan: Continue K_DUR 10 meq daily. We will try to arrange K+ check through [facility].  Chronic anxiety - Plan: ALPRAZolam (XANAX) 0.5 MG tablet Problem is stable. Continue Xanax 0.5 mg bid prn,Cylbalta 60 mg,and Seroquel 150 mg daily.  Hypotension, unspecified hypotension type - Plan: Still hypotense,this could be contributing to fatigue. Adequate hydration. Stop Metoprolol. Continue monitoring BP regularly.  Chronic pain disorder - Plan: Discussed reasonable expectations in regard to pain. Current guidelines for chronic opioid treatment reviewed. Verbal agreement. Will plan on signing med contract next [place]. Side effects of opioid meds discussed as well as the risk of interaction with Xanax. F/U in 4 weeks.  Anemia, unspecified type - Plan:  Normocytic anemia,gradually getting worse,07/06/19 H/H 7.8/24.8, 07/04/19 8.8/27.4. Chemo has been discontinued, per husband report. Continue following with oncologist.  Spinal stenosis of lumbar region with neurogenic claudication - Plan: Recommend trying Hydrocodone-Acetaminophen as prescribed, q 6 hours prn. Side effects discussed. F/U in 4 weeks.  Lobar pneumonia, unspecified organism ([place]) - Plan: Complete abx treatment. CXR to be repated in 4 weeks.    I discussed the assessment and treatment plan with the patient. She and her husband were provided an opportunity to ask questions and all were answered. They agreed with the plan and demonstrated an understanding of the instructions.     Return in about 4 weeks (around 08/06/2019).     [place], MD

## 2019-07-09 NOTE — Telephone Encounter (Signed)
Noted. Today Metoprolol was discontinued. We will follow I 4 weeks.  Kaely Hollan Martinique, MD

## 2019-07-09 NOTE — Telephone Encounter (Signed)
Spoke with Jeanne Ivan ( Nurse from [facility] ) with Palliative care. Tammy reports patient BP today was 98/52, patient is sleeping 90% of the time, and is not bouncing back like she has before. Tammy reports she will draw the BMP that the hospital recommended for f/u. Tammy reports they husband and patient are talking about changing code status to DNR.

## 2019-07-12 ENCOUNTER — Other Ambulatory Visit: Payer: Self-pay | Admitting: *Deleted

## 2019-07-12 ENCOUNTER — Telehealth: Payer: Self-pay | Admitting: Family Medicine

## 2019-07-12 NOTE — Telephone Encounter (Signed)
See note

## 2019-07-12 NOTE — Telephone Encounter (Signed)
Home Health Verbal Orders - Caller/Agency: [facility] Callback Number: [phone] [id number] OT/PT/Skilled Nursing/Social Work/Speech Therapy: Resumption of care after being in the hospital Frequency: N/A

## 2019-07-12 NOTE — Telephone Encounter (Signed)
Verbal orders given to [facility] as requested to resume services after being in the hospital.

## 2019-07-12 NOTE — Patient Outreach (Signed)
[facility]) Care Management  07/12/2019  Tonya Bradley 04/19/1951 [id number]    Case Closure-[facility]  RN spoke with pt's spouse Tonya Bradley) and explained pt is involved with [facility] who is addressing her case management needs. Therefore [facility] will no be involved for case management services as a active pt. Spouse verbalized an understanding and will continue to follow up with [facility] for the pt's needs.  Case will be closed and provider will be updated on pt's disposition with [facility] services.  Raina Mina, RN Care Management Coordinator [place] Office [phone]

## 2019-07-14 ENCOUNTER — Ambulatory Visit: Payer: Self-pay | Admitting: *Deleted

## 2019-07-14 DIAGNOSIS — I5032 Chronic diastolic (congestive) heart failure: Secondary | ICD-10-CM | POA: Diagnosis not present

## 2019-07-14 DIAGNOSIS — R7989 Other specified abnormal findings of blood chemistry: Secondary | ICD-10-CM | POA: Diagnosis not present

## 2019-07-14 DIAGNOSIS — E876 Hypokalemia: Secondary | ICD-10-CM | POA: Diagnosis not present

## 2019-07-15 ENCOUNTER — Telehealth: Payer: Self-pay | Admitting: Family Medicine

## 2019-07-15 NOTE — Telephone Encounter (Signed)
Tonya Bradley, Patient's husband is calling regarding the patient.  Patient is having pain in her stomach.  Patient is a cancer patient   Requesting a something stronger or a refill on    HYDROcodone-acetaminophen (NORCO/VICODIN) 5-325 MG tablet [[id number]]    Preferred [place]   Please advise Thank you

## 2019-07-15 NOTE — Telephone Encounter (Signed)
Dr. Martinique please advise

## 2019-07-16 ENCOUNTER — Other Ambulatory Visit: Payer: Self-pay | Admitting: Family Medicine

## 2019-07-16 ENCOUNTER — Telehealth: Payer: Self-pay | Admitting: Family Medicine

## 2019-07-16 DIAGNOSIS — I5032 Chronic diastolic (congestive) heart failure: Secondary | ICD-10-CM | POA: Diagnosis not present

## 2019-07-16 DIAGNOSIS — C3491 Malignant neoplasm of unspecified part of right bronchus or lung: Secondary | ICD-10-CM | POA: Diagnosis not present

## 2019-07-16 DIAGNOSIS — I4892 Unspecified atrial flutter: Secondary | ICD-10-CM | POA: Diagnosis not present

## 2019-07-16 DIAGNOSIS — I110 Hypertensive heart disease with heart failure: Secondary | ICD-10-CM | POA: Diagnosis not present

## 2019-07-16 DIAGNOSIS — I2510 Atherosclerotic heart disease of native coronary artery without angina pectoris: Secondary | ICD-10-CM | POA: Diagnosis not present

## 2019-07-16 DIAGNOSIS — H269 Unspecified cataract: Secondary | ICD-10-CM | POA: Diagnosis not present

## 2019-07-16 DIAGNOSIS — M48062 Spinal stenosis, lumbar region with neurogenic claudication: Secondary | ICD-10-CM

## 2019-07-16 DIAGNOSIS — J9611 Chronic respiratory failure with hypoxia: Secondary | ICD-10-CM | POA: Diagnosis not present

## 2019-07-16 DIAGNOSIS — J438 Other emphysema: Secondary | ICD-10-CM | POA: Diagnosis not present

## 2019-07-16 DIAGNOSIS — D630 Anemia in neoplastic disease: Secondary | ICD-10-CM | POA: Diagnosis not present

## 2019-07-16 MED ORDER — HYDROCODONE-ACETAMINOPHEN 5-325 MG PO TABS: 1.0000 | ORAL_TABLET | Freq: Four times a day (QID) | ORAL | 0 refills | Status: DC | PRN

## 2019-07-16 NOTE — Telephone Encounter (Signed)
Is Decadron part of hospice treatment protocol?  Is this part of her chemo treatment? Because I am not aware of using Decadron for energy "boost" or for increasing appetite. Maybe the most appropriate to address this request if the hospice physician.  Thanks, BJ

## 2019-07-16 NOTE — Telephone Encounter (Signed)
Spoke with Tammy and she faxed results over again.  Message sent to Dr. Martinique for review.

## 2019-07-16 NOTE — Telephone Encounter (Signed)
Left vm with verbal orders for PT and OT as requested.

## 2019-07-16 NOTE — Telephone Encounter (Signed)
Caller: [facility]   Requesting:  PT and OT eval next week.    No nursing need because patient has palliative care right now.   VM ok.

## 2019-07-16 NOTE — Telephone Encounter (Signed)
Tammy, with [facility], calling on behalf of patient to check status of this request. She states the patient can be contacted directly.

## 2019-07-16 NOTE — Telephone Encounter (Signed)
Rx for hydrocodone-acetaminophen 5-325 mg to take every 6 hours as needed for pain. Thanks, BJ

## 2019-07-16 NOTE — Telephone Encounter (Signed)
Tammy from Hospice called and wanted to make sure the BMP results for the Pt was received and if not she can fax them.   Tammy wanted to update Dr. Martinique that the Pt is getting weaker and sleeping a lot more.  The Pt is recognizing that her health is declining and wants to take a "last" trip [place] the mountains. Pt has a CT scan appt for 8.28.20 and the nurse is wanting to get an ok from Dr. Martinique for Decadron to help with the Pt's pain, appetite, breathing and boost her energy while she is planning things she wants to do./ please advise

## 2019-07-20 NOTE — Telephone Encounter (Signed)
Spoke with Anne Ng and gave verbal orders for and OT as requested.

## 2019-07-20 NOTE — Telephone Encounter (Signed)
Spoke with Tonya Bradley and she stated that she had seen the note that Dr. Martinique had wrote in Epic so they referred back to the hospice physician concerning medication. [place]a [place]  Nothing further needed at this time.

## 2019-07-23 DIAGNOSIS — M5136 Other intervertebral disc degeneration, lumbar region: Secondary | ICD-10-CM | POA: Diagnosis not present

## 2019-07-23 DIAGNOSIS — J438 Other emphysema: Secondary | ICD-10-CM | POA: Diagnosis not present

## 2019-07-23 DIAGNOSIS — I110 Hypertensive heart disease with heart failure: Secondary | ICD-10-CM | POA: Diagnosis not present

## 2019-07-23 DIAGNOSIS — C3491 Malignant neoplasm of unspecified part of right bronchus or lung: Secondary | ICD-10-CM | POA: Diagnosis not present

## 2019-07-23 DIAGNOSIS — I5032 Chronic diastolic (congestive) heart failure: Secondary | ICD-10-CM | POA: Diagnosis not present

## 2019-07-23 DIAGNOSIS — I480 Paroxysmal atrial fibrillation: Secondary | ICD-10-CM | POA: Diagnosis not present

## 2019-07-23 DIAGNOSIS — G894 Chronic pain syndrome: Secondary | ICD-10-CM | POA: Diagnosis not present

## 2019-07-23 DIAGNOSIS — J9611 Chronic respiratory failure with hypoxia: Secondary | ICD-10-CM | POA: Diagnosis not present

## 2019-07-23 DIAGNOSIS — M48062 Spinal stenosis, lumbar region with neurogenic claudication: Secondary | ICD-10-CM | POA: Diagnosis not present

## 2019-07-26 ENCOUNTER — Other Ambulatory Visit: Payer: Self-pay

## 2019-07-26 ENCOUNTER — Encounter (HOSPITAL_COMMUNITY): Payer: Self-pay

## 2019-07-26 ENCOUNTER — Ambulatory Visit (HOSPITAL_COMMUNITY)
Admission: RE | Admit: 2019-07-26 | Discharge: 2019-07-26 | Disposition: A | Discharge status: Home / Self Care | Payer: Medicare HMO | Source: Ambulatory Visit | Attending: Physician Assistant | Admitting: Physician Assistant

## 2019-07-26 ENCOUNTER — Other Ambulatory Visit: Payer: Self-pay | Admitting: Medical Oncology

## 2019-07-26 DIAGNOSIS — C3491 Malignant neoplasm of unspecified part of right bronchus or lung: Secondary | ICD-10-CM | POA: Insufficient documentation

## 2019-07-26 DIAGNOSIS — C3490 Malignant neoplasm of unspecified part of unspecified bronchus or lung: Secondary | ICD-10-CM | POA: Diagnosis not present

## 2019-07-26 DIAGNOSIS — R112 Nausea with vomiting, unspecified: Secondary | ICD-10-CM

## 2019-07-26 MED ORDER — PROCHLORPERAZINE MALEATE 10 MG PO TABS: ORAL_TABLET | ORAL | 0 refills | Status: DC

## 2019-07-26 MED ORDER — IOHEXOL 300 MG/ML  SOLN
75.0000 mL | Freq: Once | INTRAMUSCULAR | Status: AC | PRN
  Administered 2019-07-26: 75 mL via INTRAVENOUS

## 2019-07-26 MED ORDER — SODIUM CHLORIDE (PF) 0.9 % IJ SOLN
INTRAMUSCULAR | Status: AC
  Filled 2019-07-26: qty 50

## 2019-07-26 NOTE — Progress Notes (Signed)
Nausea/Constipation-LBM 3 days ago . I instructed pt to try Mag citrate ( it worked in the past). Husband asking if she can take an enema and I told her it would be fine to try an enema too.

## 2019-07-26 NOTE — Telephone Encounter (Signed)
This encounter was created in error - please disregard.

## 2019-07-30 ENCOUNTER — Telehealth: Payer: Self-pay | Admitting: Physician Assistant

## 2019-07-30 ENCOUNTER — Inpatient Hospital Stay: Payer: Medicare HMO | Attending: Internal Medicine

## 2019-07-30 ENCOUNTER — Other Ambulatory Visit: Payer: Self-pay | Admitting: Internal Medicine

## 2019-07-30 ENCOUNTER — Other Ambulatory Visit: Payer: Self-pay

## 2019-07-30 DIAGNOSIS — C3491 Malignant neoplasm of unspecified part of right bronchus or lung: Secondary | ICD-10-CM

## 2019-07-30 DIAGNOSIS — C3411 Malignant neoplasm of upper lobe, right bronchus or lung: Secondary | ICD-10-CM | POA: Diagnosis not present

## 2019-07-30 DIAGNOSIS — G894 Chronic pain syndrome: Secondary | ICD-10-CM | POA: Diagnosis not present

## 2019-07-30 DIAGNOSIS — I480 Paroxysmal atrial fibrillation: Secondary | ICD-10-CM | POA: Diagnosis not present

## 2019-07-30 DIAGNOSIS — I5032 Chronic diastolic (congestive) heart failure: Secondary | ICD-10-CM | POA: Diagnosis not present

## 2019-07-30 DIAGNOSIS — M48062 Spinal stenosis, lumbar region with neurogenic claudication: Secondary | ICD-10-CM | POA: Diagnosis not present

## 2019-07-30 DIAGNOSIS — I110 Hypertensive heart disease with heart failure: Secondary | ICD-10-CM | POA: Diagnosis not present

## 2019-07-30 DIAGNOSIS — J9611 Chronic respiratory failure with hypoxia: Secondary | ICD-10-CM | POA: Diagnosis not present

## 2019-07-30 DIAGNOSIS — J438 Other emphysema: Secondary | ICD-10-CM | POA: Diagnosis not present

## 2019-07-30 DIAGNOSIS — M5136 Other intervertebral disc degeneration, lumbar region: Secondary | ICD-10-CM | POA: Diagnosis not present

## 2019-07-30 LAB — CBC WITH DIFFERENTIAL (CANCER CENTER ONLY)
Abs Immature Granulocytes: 0.82 10*3/uL — ABNORMAL HIGH (ref 0.00–0.07)
Basophils Absolute: 0.1 10*3/uL (ref 0.0–0.1)
Basophils Relative: 0 %
Eosinophils Absolute: 0 10*3/uL (ref 0.0–0.5)
Eosinophils Relative: 0 %
HCT: 36.6 % (ref 36.0–46.0)
Hemoglobin: 11.8 g/dL — ABNORMAL LOW (ref 12.0–15.0)
Immature Granulocytes: 4 %
Lymphocytes Relative: 7 %
Lymphs Abs: 1.5 10*3/uL (ref 0.7–4.0)
MCH: 31.5 pg (ref 26.0–34.0)
MCHC: 32.2 g/dL (ref 30.0–36.0)
MCV: 97.6 fL (ref 80.0–100.0)
Monocytes Absolute: 1.5 10*3/uL — ABNORMAL HIGH (ref 0.1–1.0)
Monocytes Relative: 7 %
Neutro Abs: 16.7 10*3/uL — ABNORMAL HIGH (ref 1.7–7.7)
Neutrophils Relative %: 82 %
Platelet Count: 307 10*3/uL (ref 150–400)
RBC: 3.75 MIL/uL — ABNORMAL LOW (ref 3.87–5.11)
RDW: 19.3 % — ABNORMAL HIGH (ref 11.5–15.5)
WBC Count: 20.6 10*3/uL — ABNORMAL HIGH (ref 4.0–10.5)
nRBC: 0 % (ref 0.0–0.2)

## 2019-07-30 LAB — CMP (CANCER CENTER ONLY)
ALT: 33 U/L (ref 0–44)
AST: 28 U/L (ref 15–41)
Albumin: 3.9 g/dL (ref 3.5–5.0)
Alkaline Phosphatase: 121 U/L (ref 38–126)
Anion gap: 11 (ref 5–15)
BUN: 30 mg/dL — ABNORMAL HIGH (ref 8–23)
CO2: 30 mmol/L (ref 22–32)
Calcium: 10.3 mg/dL (ref 8.9–10.3)
Chloride: 96 mmol/L — ABNORMAL LOW (ref 98–111)
Creatinine: 0.73 mg/dL (ref 0.44–1.00)
GFR, Est AFR Am: >60 mL/min (ref >60)
GFR, Estimated: >60 mL/min (ref >60)
Glucose, Bld: 103 mg/dL — ABNORMAL HIGH (ref 70–99)
Potassium: 4.3 mmol/L (ref 3.5–5.1)
Sodium: 137 mmol/L (ref 135–145)
Total Bilirubin: 0.3 mg/dL (ref 0.3–1.2)
Total Protein: 7.1 g/dL (ref 6.5–8.1)

## 2019-07-30 LAB — MAGNESIUM: Magnesium: 1.8 mg/dL (ref 1.7–2.4)

## 2019-07-30 NOTE — Telephone Encounter (Signed)
Spoke to the patient about her lab work from today. Her WBCs are elevated. The patient reports that she is feeling well and denies any fevers, dysuria, sore throat, cough, skin infections, etc. She is being prescribed steroids by an outside provider which likely explains the leukocytosis. We will see the patient Monday for a follow up appointment to review her scan. All questions answered.

## 2019-07-30 NOTE — Telephone Encounter (Signed)
Called the patient to clarify her current medications and to review lab work for today. Left a voicemail to call the office back.

## 2019-08-02 ENCOUNTER — Encounter: Payer: Self-pay | Admitting: Internal Medicine

## 2019-08-02 ENCOUNTER — Other Ambulatory Visit: Payer: Self-pay

## 2019-08-02 ENCOUNTER — Inpatient Hospital Stay (HOSPITAL_BASED_OUTPATIENT_CLINIC_OR_DEPARTMENT_OTHER): Payer: Medicare HMO | Admitting: Internal Medicine

## 2019-08-02 VITALS — BP 143/69 | HR 110 | Temp 98.0°F | Resp 18 | Ht 62.0 in | Wt 145.9 lb

## 2019-08-02 DIAGNOSIS — Z720 Tobacco use: Secondary | ICD-10-CM

## 2019-08-02 DIAGNOSIS — C3491 Malignant neoplasm of unspecified part of right bronchus or lung: Secondary | ICD-10-CM | POA: Diagnosis not present

## 2019-08-02 DIAGNOSIS — J439 Emphysema, unspecified: Secondary | ICD-10-CM | POA: Diagnosis not present

## 2019-08-02 DIAGNOSIS — C3411 Malignant neoplasm of upper lobe, right bronchus or lung: Secondary | ICD-10-CM | POA: Diagnosis not present

## 2019-08-02 NOTE — Progress Notes (Signed)
[facility] [phone]   [phone]  OFFICE PROGRESS NOTE  Bradley, Tonya G, MD [place] [address]  DIAGNOSIS: limited stage, stage IA (T1b, N0, M0) small cell lung cancer   PRIOR THERAPY:  1) status post right upper and middle bilobectomy with lymph node dissection under the care of Dr. [name] January 31, 2019. 2) chemotherapy with cisplatin 80 mg/M2 on day 1 and etoposide 100 mg/M2 on days 1, 2 and 3 every 3 weeks.  Status post 3 cycles.  CURRENT THERAPY: Observation.  INTERVAL HISTORY: Tonya Bradley 68 y.o. female returns to the clinic today for follow-up visit.  The patient is feeling fine today with no concerning complaints.  She was recently started by her primary care physician on steroid to help her appetite.  Her total white blood count is elevated because of the steroid treatment.  She denied having any current chest pain, shortness of breath, cough or hemoptysis.  She denied having any fever or chills.  She has no nausea, vomiting, diarrhea or constipation.  She has no headache or visual changes.  Her adjuvant treatment with systemic chemotherapy was discontinued at cycle #3 because of the significant toxicity and pancytopenia.  She had repeat CT scan of the chest performed recently and she is here for evaluation and discussion of her scan results and treatment options.  MEDICAL HISTORY: Past Medical History:  Diagnosis Date   Acute on chronic diastolic (congestive) heart failure ([facility]) 06/12/2019   AKI (acute kidney injury) ([facility])    Anemia    Anxiety    Arthritis    "back"   Atrial flutter ([facility]) 03/13/2016   CAD (coronary artery disease)    Cancer ([facility])    skin cancer   Cataract    Colon polyp    Complication of anesthesia    BP dropped during esophagus diltation in 2017-admitted for 3 days.    COPD (chronic obstructive pulmonary disease) ([facility])    Depression    Dysrhythmia    Emphysema  of lung ([facility])    GERD (gastroesophageal reflux disease)    Hypercholesterolemia    Lung cancer ([facility]) dx'd 01/2019   Osteoporosis    Oxygen deficiency     ALLERGIES:  is allergic to morphine and related.  MEDICATIONS:  Current Outpatient Medications  Medication Sig Dispense Refill   ALPRAZolam (XANAX) 0.5 MG tablet Take 1 tablet (0.5 mg total) by mouth 2 (two) times daily. 60 tablet 2   atorvastatin (LIPITOR) 80 MG tablet TAKE 1 TABLET(80 MG) BY MOUTH DAILY (Patient taking differently: Take 80 mg by mouth daily. ) 90 tablet 1   Cholecalciferol (VITAMIN D) 2000 units CAPS Take 2,000 Units by mouth daily.     cycloSPORINE (RESTASIS) 0.05 % ophthalmic emulsion Place 1 drop into both eyes 2 (two) times daily.     DULoxetine (CYMBALTA) 60 MG capsule Take 1 capsule (60 mg total) by mouth daily. 90 capsule 1   HYDROcodone-acetaminophen (NORCO/VICODIN) 5-325 MG tablet Take 1 tablet by mouth every 6 (six) hours as needed for moderate pain. 120 tablet 0   ipratropium-albuterol (DUONEB) 0.5-2.5 (3) MG/3ML SOLN Take 3 mLs by nebulization every 4 (four) hours as needed (shortness of breath or wheezing). 360 mL 0   loratadine (CLARITIN) 10 MG tablet Take 10 mg by mouth daily.     MAGNESIUM-OXIDE 400 (241.3 Mg) MG tablet TAKE 1 TABLET BY MOUTH TWICE DAILY 60 tablet 1   metoCLOPramide (REGLAN)  5 MG tablet Take 1 tablet (5 mg total) by mouth 3 (three) times daily before meals. 90 tablet 2   Multiple Vitamin (MULTIVITAMIN WITH MINERALS) TABS tablet Take 1 tablet by mouth daily.     potassium chloride (K-DUR) 10 MEQ tablet Take 1 tablet (10 mEq total) by mouth 2 (two) times daily. 180 tablet 3   prochlorperazine (COMPAZINE) 10 MG tablet TAKE 1 TABLET(10 MG) BY MOUTH EVERY 6 HOURS AS NEEDED FOR NAUSEA OR VOMITING 30 tablet 0   QUEtiapine (SEROQUEL) 100 MG tablet Take 1.5 tablets (150 mg total) by mouth at bedtime. 45 tablet 0   VENTOLIN HFA 108 (90 Base) MCG/ACT inhaler INHALE 2 PUFFS INTO  THE LUNGS EVERY 6 HOURS AS NEEDED FOR WHEEZING OR SHORTNESS OF BREATH (Patient taking differently: Inhale 2 puffs into the lungs every 6 (six) hours as needed for wheezing or shortness of breath. ) 18 g 0   Fluticasone-Umeclidin-Vilant (TRELEGY ELLIPTA) 100-62.5-25 MCG/INH AEPB Inhale 1 puff into the lungs daily. (Patient not taking: Reported on 08/02/2019) 60 each 3   ondansetron (ZOFRAN) 4 MG tablet TAKE 1 TABLET(4 MG) BY MOUTH TWICE DAILY AS NEEDED FOR NAUSEA OR VOMITING (Patient not taking: No sig reported) 40 tablet 0   rOPINIRole (REQUIP) 0.25 MG tablet Take 2 tablets (0.5 mg total) by mouth at bedtime. (Patient not taking: Reported on 08/02/2019) 180 tablet 0   torsemide (DEMADEX) 20 MG tablet Take 1 tablet (20 mg total) by mouth as needed. (Patient not taking: Reported on 07/07/2019) 90 tablet 3   No current facility-administered medications for this visit.     SURGICAL HISTORY:  Past Surgical History:  Procedure Laterality Date   A-FLUTTER ABLATION N/A 05/19/2017   Procedure: A-Flutter Ablation;  Surgeon: Evans Lance, MD;  Location: [facility];  Service: Cardiovascular;  Laterality: N/A;   APPENDECTOMY     BASAL CELL CARCINOMA EXCISION     CARDIAC CATHETERIZATION     CATARACT EXTRACTION Bilateral    CHOLECYSTECTOMY     COLONOSCOPY N/A 06/23/2014   Dr. Rourk:Rectal and colonic polyps-removed, Pancolonic diverticulosis. lymphocytic colitis   CORONARY ANGIOPLASTY WITH STENT PLACEMENT     ESOPHAGEAL DILATION N/A 03/13/2016   Procedure: ESOPHAGEAL DILATION;  Surgeon: Daneil Dolin, MD;  [facility];  Service: Endoscopy;  Laterality: N/A;   ESOPHAGOGASTRODUODENOSCOPY N/A 03/13/2016   Procedure: ESOPHAGOGASTRODUODENOSCOPY (EGD);  Surgeon: Daneil Dolin, MD;  [facility];  Service: Endoscopy;  Laterality: N/A;  0930   EYE SURGERY     cataract removed, bilateral   IR THORACENTESIS ASP PLEURAL SPACE W/IMG GUIDE  02/15/2019   LUMBAR  LAMINECTOMY/DECOMPRESSION MICRODISCECTOMY N/A 01/19/2018   Procedure: L4-5 DECOMPRESSION;  Surgeon: Marybelle Killings, MD;  Location: [facility];  Service: Orthopedics;  Laterality: N/A;   VIDEO ASSISTED THORACOSCOPY (VATS)/ LOBECTOMY Right 02/04/2019   Procedure: VIDEO ASSISTED THORACOSCOPY (VATS)/RIGHT UPPER AND MIDDLE LOBECTOMY;  Surgeon: Melrose Nakayama, MD;  Location: [facility];  Service: Thoracic;  Laterality: Right;   VIDEO BRONCHOSCOPY N/A 02/14/2019   Procedure: BRONCHOSCOPY WITH SEDATION;  Surgeon: Melrose Nakayama, MD;  Location: [facility];  Service: Thoracic;  Laterality: N/A;    REVIEW OF SYSTEMS:  Constitutional: negative Eyes: negative Ears, nose, mouth, throat, and face: negative Respiratory: positive for dyspnea on exertion Cardiovascular: negative Gastrointestinal: negative Genitourinary:negative Integument/breast: negative Hematologic/lymphatic: negative Musculoskeletal:negative Neurological: negative Behavioral/Psych: negative Endocrine: negative Allergic/Immunologic: negative   PHYSICAL EXAMINATION: General appearance: alert, cooperative, fatigued and no distress Head: Normocephalic, without obvious  abnormality, atraumatic Neck: no adenopathy, no JVD, supple, symmetrical, trachea midline and thyroid not enlarged, symmetric, no tenderness/mass/nodules Lymph nodes: Cervical, supraclavicular, and axillary nodes normal. Resp: clear to auscultation bilaterally Back: symmetric, no curvature. ROM normal. No CVA tenderness. Cardio: regular rate and rhythm, S1, S2 normal, no murmur, click, rub or gallop GI: soft, non-tender; bowel sounds normal; no masses,  no organomegaly Extremities: extremities normal, atraumatic, no cyanosis or edema Neurologic: Alert and oriented X 3, normal strength and tone. Normal symmetric reflexes. Normal coordination and gait  ECOG PERFORMANCE STATUS: 1 - Symptomatic but completely ambulatory  Blood pressure (!) 143/69, pulse (!) 110, temperature 98  F (36.7 C), temperature source Temporal, resp. rate 18, height 5\' 2"  (1.575 m), weight 145 lb 14.4 oz (66.2 kg), SpO2 96 %.  LABORATORY DATA: Lab Results  Component Value Date   WBC 20.6 (H) 07/30/2019   HGB 11.8 (L) 07/30/2019   HCT 36.6 07/30/2019   MCV 97.6 07/30/2019   PLT 307 07/30/2019      Chemistry      Component Value Date/Time   NA 137 07/30/2019 1122   NA 137 05/12/2017 1142   K 4.3 07/30/2019 1122   CL 96 (L) 07/30/2019 1122   CO2 30 07/30/2019 1122   BUN 30 (H) 07/30/2019 1122   BUN 18 05/12/2017 1142   CREATININE 0.73 07/30/2019 1122   CREATININE 0.66 06/19/2018 1554      Component Value Date/Time   CALCIUM 10.3 07/30/2019 1122   ALKPHOS 121 07/30/2019 1122   AST 28 07/30/2019 1122   ALT 33 07/30/2019 1122   BILITOT 0.3 07/30/2019 1122       RADIOGRAPHIC STUDIES: Dg Lumbar Spine Complete  Result Date: 07/03/2019 CLINICAL DATA:  Low back pain EXAM: LUMBAR SPINE - COMPLETE 4+ VIEW COMPARISON:  January 27, 2018 FINDINGS: There is no acute displaced fracture. There are multilevel degenerative changes throughout the lumbar spine, greatest at the lower lumbar segments. There is multilevel facet arthrosis. Vascular calcifications are noted. IMPRESSION: No acute osseous abnormality.  Degenerative changes as above. Electronically Signed   By: Constance Holster M.D.   On: 07/03/2019 18:57   Ct Chest W Contrast  Result Date: 07/26/2019 CLINICAL DATA:  Restaging lung cancer, s/p right upper lobectomy and chemotherapy EXAM: CT CHEST WITH CONTRAST TECHNIQUE: Multidetector CT imaging of the chest was performed during intravenous contrast administration. CONTRAST:  47mL OMNIPAQUE IOHEXOL 300 MG/ML  SOLN COMPARISON:  06/13/2019, 02/15/2019 FINDINGS: Cardiovascular: Aortic atherosclerosis. Normal heart size. Extensive 3 vessel coronary artery calcifications. No pericardial effusion. Mediastinum/Nodes: No enlarged mediastinal, hilar, or axillary lymph nodes. Frothy debris in  the trachea. Thyroid gland and esophagus demonstrate no significant findings. Lungs/Pleura: Status post right upper lobectomy. Mild bandlike scarring of the left lung base. No pleural effusion or pneumothorax. Upper Abdomen: No acute abnormality. Musculoskeletal: No chest wall mass or suspicious bone lesions identified. IMPRESSION: 1. Unchanged postoperative findings status post right upper lobectomy. No evidence of malignant recurrence. 2.  Previously seen small pleural effusions are resolved. 3.  Aortic atherosclerosis and coronary artery disease. Electronically Signed   By: Eddie Candle M.D.   On: 07/26/2019 10:38   Dg Chest Port 1 View  Result Date: 07/03/2019 CLINICAL DATA:  Shortness of breath EXAM: PORTABLE CHEST 1 VIEW COMPARISON:  June 12, 2019 FINDINGS: Postoperative changes noted on the right with volume loss. There is ill-defined opacity in the lateral right base with small right pleural effusion. Left lung is clear. Heart size and pulmonary vascularity  are normal. No adenopathy evident. There is aortic atherosclerosis. No bone lesions. IMPRESSION: Area of ill-defined opacity lateral right base, likely pneumonia. Small right pleural effusion. Postoperative change with volume loss and scarring on the right. Left lung clear. Stable cardiac silhouette. No adenopathy evident. Aortic Atherosclerosis (ICD10-I70.0). Followup PA and lateral chest radiographs recommended in 3-4 weeks following trial of antibiotic therapy to ensure resolution and exclude underlying malignancy. Electronically Signed   By: Lowella Grip III M.D.   On: 07/03/2019 18:27    ASSESSMENT AND PLAN: This is a very pleasant 68 years old white female with limited stage, stage Ia small cell lung cancer right upper and middle bilobectomy. The patient completed adjuvant systemic chemotherapy with cisplatin and etoposide status post 3 cycles.  Her treatment discontinued after cycle #3 secondary to significant toxicity and  pancytopenia. The patient had repeat CT scan of the chest performed recently.  I personally and independently reviewed the scans and discussed the results with the patient today. Her scan showed no concerning findings for disease recurrence. I recommended for the patient to continue on observation with repeat CT scan of the chest in 3 months. I will also refer the patient to radiation oncology for evaluation and discussion of prophylactic cranial irradiation. She was advised to call immediately if she has any other concerning symptoms in the interval. The patient voices understanding of current disease status and treatment options and is in agreement with the current care plan.  All questions were answered. The patient knows to call the clinic with any problems, questions or concerns. We can certainly see the patient much sooner if necessary.  Disclaimer: This note was dictated with voice recognition software. Similar sounding words can inadvertently be transcribed and may not be corrected upon review.

## 2019-08-04 ENCOUNTER — Telehealth: Payer: Self-pay | Admitting: Internal Medicine

## 2019-08-04 NOTE — Telephone Encounter (Signed)
Confirmed November/December appointments with patient. Central radiology will call re scan. Radiation oncology will re referral.

## 2019-08-08 DIAGNOSIS — J449 Chronic obstructive pulmonary disease, unspecified: Secondary | ICD-10-CM | POA: Diagnosis not present

## 2019-08-10 ENCOUNTER — Other Ambulatory Visit: Payer: Self-pay | Admitting: Family Medicine

## 2019-08-10 DIAGNOSIS — C3491 Malignant neoplasm of unspecified part of right bronchus or lung: Secondary | ICD-10-CM

## 2019-08-10 DIAGNOSIS — M48062 Spinal stenosis, lumbar region with neurogenic claudication: Secondary | ICD-10-CM

## 2019-08-10 NOTE — Telephone Encounter (Signed)
HYDROcodone-acetaminophen (NORCO/VICODIN) 5-325 MG tablet  Pt had an appt in August- Virtual   [id number] - [place], [address] [phone] (Phone) [phone] (Fax)   Pls refill

## 2019-08-11 ENCOUNTER — Ambulatory Visit
Admission: RE | Admit: 2019-08-11 | Discharge: 2019-08-11 | Disposition: A | Discharge status: Home / Self Care | Payer: Medicare HMO | Source: Ambulatory Visit | Attending: Radiation Oncology | Admitting: Radiation Oncology

## 2019-08-11 ENCOUNTER — Encounter: Payer: Self-pay | Admitting: Radiation Oncology

## 2019-08-11 ENCOUNTER — Other Ambulatory Visit: Payer: Self-pay

## 2019-08-11 VITALS — BP 136/75 | HR 140 | Temp 98.3°F | Resp 20 | Ht 62.0 in | Wt 155.4 lb

## 2019-08-11 DIAGNOSIS — Z298 Encounter for other specified prophylactic measures: Secondary | ICD-10-CM | POA: Diagnosis not present

## 2019-08-11 DIAGNOSIS — Z9221 Personal history of antineoplastic chemotherapy: Secondary | ICD-10-CM | POA: Diagnosis not present

## 2019-08-11 DIAGNOSIS — C3491 Malignant neoplasm of unspecified part of right bronchus or lung: Secondary | ICD-10-CM

## 2019-08-11 DIAGNOSIS — F17210 Nicotine dependence, cigarettes, uncomplicated: Secondary | ICD-10-CM | POA: Diagnosis not present

## 2019-08-11 DIAGNOSIS — C3411 Malignant neoplasm of upper lobe, right bronchus or lung: Secondary | ICD-10-CM | POA: Diagnosis not present

## 2019-08-11 NOTE — Progress Notes (Signed)
Histology and Location of Primary Cancer: limited stage, stage IA (T1b, N0, M0) small cell lung cancer   Location(s) of Symptomatic tumor(s): prophylactic cranial irradiation  Past/Anticipated chemotherapy by medical oncology, if any: Per Dr. Julien Nordmann 08/02/19:  PRIOR THERAPY:  1) status post right upper and middle bilobectomy with lymph node dissection under the care of Dr. [name] January 31, 2019. 2) chemotherapy with cisplatin 80 mg/M2 on day 1 and etoposide 100 mg/M2 on days 1, 2 and 3 every 3 weeks.  Status post 3 cycles.  CURRENT THERAPY: Observation ASSESSMENT AND PLAN: This is a very pleasant 68 years old white female with limited stage, stage Ia small cell lung cancer right upper and middle bilobectomy. The patient completed adjuvant systemic chemotherapy with cisplatin and etoposide status post 3 cycles.  Her treatment discontinued after cycle #3 secondary to significant toxicity and pancytopenia. The patient had repeat CT scan of the chest performed recently.  I personally and independently reviewed the scans and discussed the results with the patient today. Her scan showed no concerning findings for disease recurrence. I recommended for the patient to continue on observation with repeat CT scan of the chest in 3 months. I will also refer the patient to radiation oncology for evaluation and discussion of prophylactic cranial irradiation.   Pain on a scale of 0-10 is: Pt denies c/o pain.   Current Decadron regimen, if applicable: None at this time. She is being prescribed steroids by an outside provider which likely explains the leukocytosis  Ambulatory status? Walker? Wheelchair?: Pt reports hospitalizations recently for falls. Pt with steady gait without assistive device.  She denied having any current chest pain, shortness of breath, cough or hemoptysis.  She denied having any fever or chills.  She has no nausea, vomiting, diarrhea or constipation.  She has no headache or  visual changes.   SAFETY ISSUES:  Prior radiation? No  Pacemaker/ICD? No  Possible current pregnancy? No  Is the patient on methotrexate? No  Additional Complaints / other details:  Pt presents today for initial consult with Dr. Sondra Come for Radiation Oncology. Pt to have husband on her cell phone for appt.  BP 136/75 (BP Location: Left Arm, Patient Position: Sitting)   Pulse (!) 140   Temp 98.3 F (36.8 C) (Temporal)   Resp 20   Ht 5\' 2"  (1.575 m)   Wt 155 lb 6 oz (70.5 kg)   SpO2 97%   BMI 28.42 kg/m   Wt Readings from Last 3 Encounters:  08/11/19 155 lb 6 oz (70.5 kg)  08/02/19 145 lb 14.4 oz (66.2 kg)  07/03/19 139 lb 1.6 oz (63.1 kg)   Loma Sousa, RN BSN

## 2019-08-11 NOTE — Progress Notes (Signed)
Radiation Oncology         [phone] ________________________________  Initial Outpatient Consultation  Name: Tonya Bradley MRN: [id number]  Date: 08/11/2019  DOB: 11-02-1951  TD:SKAJGO, Malka So, MD  Curt Bears, MD   REFERRING PHYSICIAN: Curt Bears, MD  DIAGNOSIS: The encounter diagnosis was Small cell lung cancer, right [facility]).   Limited stage, stage IA (T1b, N0, M0) small cell lung cancer  HISTORY OF PRESENT ILLNESS::Tonya Bradley is a 68 y.o. female who is accompanied by her husband via speaker phone. The patient initially began undergoing lung cancer screening CT scans in 04/2017. Repeat screening CT on 01/11/2019 showed: new two adjacent versus one bilobed right upper lobe pulmonary nodule(s). She proceeded to PET scan on 01/15/2019, which confirmed low level metabolism to the nodule(s) seen on CT. No additional hypermetabolic disease elsewhere.  She was referred to Dr. [name] on 01/27/2019, who recommended right VATS. This procedure was performed on 02/04/2019, with pathology showing: small cell carcinoma, 1.7 cm, no evidence of residual pleural invasion, resection margin negative, negative for lymphovascular or perineural invasion; of 21 biopsied lymph nodes, all were negative (0/21). She underwent bronchoscopy on 02/14/2019 for mucus plugging the right main stem bronchus and thoracentesis on 02/15/2019.  She was referred to Dr. Julien Nordmann on 03/25/2019, who recommended brain MRI and chemotherapy. Brain MRI performed on 04/05/2019 showed no evidence of metastatic disease. She received 3 cycles of chemotherapy with cisplatin and etoposide, discontinued secondary to significant toxicity and pancytopenia.  Most recent restaging chest CT on 07/26/2019 showed no evidence of malignant recurrence.   She has been kindly referred to me for consideration of prophylactic brain irradiation.   PREVIOUS RADIATION THERAPY: No  PAST MEDICAL HISTORY:  Past Medical History:  Diagnosis Date   . Acute on chronic diastolic (congestive) heart failure ([place]) 06/12/2019  . AKI (acute kidney injury) ([place])   . Anemia   . Anxiety   . Arthritis    "back"  . Atrial flutter ([place]) 03/13/2016  . CAD (coronary artery disease)   . Cancer ([place])    skin cancer  . Cataract   . Colon polyp   . Complication of anesthesia    BP dropped during esophagus diltation in 2017-admitted for 3 days.   Marland Kitchen COPD (chronic obstructive pulmonary disease) ([place])   . Depression   . Dysrhythmia   . Emphysema of lung ([place])   . GERD (gastroesophageal reflux disease)   . Hypercholesterolemia   . Lung cancer ([place]) dx'd 01/2019  . Osteoporosis   . Oxygen deficiency     PAST SURGICAL HISTORY: Past Surgical History:  Procedure Laterality Date  . A-FLUTTER ABLATION N/A 05/19/2017   Procedure: A-Flutter Ablation;  Surgeon: Evans Lance, MD;  Location: [place] [facility];  Service: Cardiovascular;  Laterality: N/A;  . APPENDECTOMY    . BASAL CELL CARCINOMA EXCISION    . CARDIAC CATHETERIZATION    . CATARACT EXTRACTION Bilateral   . CHOLECYSTECTOMY    . COLONOSCOPY N/A 06/23/2014   Dr. Rourk:Rectal and colonic polyps-removed, Pancolonic diverticulosis. lymphocytic colitis  . CORONARY ANGIOPLASTY WITH STENT PLACEMENT    . ESOPHAGEAL DILATION N/A 03/13/2016   Procedure: ESOPHAGEAL DILATION;  Surgeon: Daneil Dolin, MD;  [facility];  Service: Endoscopy;  Laterality: N/A;  . ESOPHAGOGASTRODUODENOSCOPY N/A 03/13/2016   Procedure: ESOPHAGOGASTRODUODENOSCOPY (EGD);  Surgeon: Daneil Dolin, MD;  [facility];  Service: Endoscopy;  Laterality: N/A;  0930  . EYE SURGERY     cataract  removed, bilateral  . IR THORACENTESIS ASP PLEURAL SPACE W/IMG GUIDE  02/15/2019  . LUMBAR LAMINECTOMY/DECOMPRESSION MICRODISCECTOMY N/A 01/19/2018   Procedure: L4-5 DECOMPRESSION;  Surgeon: Marybelle Killings, MD;  [facility];  Service: Orthopedics;  Laterality: N/A;  . VIDEO ASSISTED THORACOSCOPY (VATS)/ LOBECTOMY  Right 02/04/2019   Procedure: VIDEO ASSISTED THORACOSCOPY (VATS)/RIGHT UPPER AND MIDDLE LOBECTOMY;  Surgeon: Melrose Nakayama, MD;  Location: [place];  Service: Thoracic;  Laterality: Right;  Marland Kitchen VIDEO BRONCHOSCOPY N/A 02/14/2019   Procedure: BRONCHOSCOPY WITH SEDATION;  Surgeon: Melrose Nakayama, MD;  Location: [facility] OR;  Service: Thoracic;  Laterality: N/A;    FAMILY HISTORY:  Family History  Problem Relation Age of Onset  . Breast cancer Sister 108  . Heart disease Sister   . Heart attack Father   . Heart disease Father   . Stroke Mother   . Heart disease Mother   . Hyperlipidemia Mother   . Heart attack Sister   . Heart disease Brother        ?heart failure  . Congestive Heart Failure Brother   . Hypertension Son   . Heart disease Maternal Grandfather   . Cancer Cousin        lymphoma  . Hypertension Son   . Stomach cancer Other   . Colon cancer Neg Hx   . Pancreatic cancer Neg Hx     SOCIAL HISTORY:  Social History   Tobacco Use  . Smoking status: Current Every Day Smoker    Packs/day: 0.25    Years: 54.00    Pack years: 13.50    Types: Cigarettes    Start date: 06/14/1966  . Smokeless tobacco: Never Used  . Tobacco comment: 1-2 packs daily. Chantix too expensive; pt states that she quit 2 weeks ago (02/04/19)   Substance Use Topics  . Alcohol use: No    Alcohol/week: 0.0 standard drinks  . Drug use: No    ALLERGIES:  Allergies  Allergen Reactions  . Morphine And Related Other (See Comments)    hallucinations    MEDICATIONS:  Current Outpatient Medications  Medication Sig Dispense Refill  . ALPRAZolam (XANAX) 0.5 MG tablet Take 1 tablet (0.5 mg total) by mouth 2 (two) times daily. 60 tablet 2  . atorvastatin (LIPITOR) 80 MG tablet TAKE 1 TABLET(80 MG) BY MOUTH DAILY (Patient taking differently: Take 80 mg by mouth daily. ) 90 tablet 1  . Cholecalciferol (VITAMIN D) 2000 units CAPS Take 2,000 Units by mouth daily.    . cycloSPORINE (RESTASIS) 0.05 %  ophthalmic emulsion Place 1 drop into both eyes 2 (two) times daily.    Marland Kitchen dexamethasone (DECADRON) 4 MG tablet TK 1 T PO QD    . DULoxetine (CYMBALTA) 60 MG capsule Take 1 capsule (60 mg total) by mouth daily. 90 capsule 1  . Fluticasone-Umeclidin-Vilant (TRELEGY ELLIPTA) 100-62.5-25 MCG/INH AEPB Inhale 1 puff into the lungs daily. 60 each 3  . HYDROcodone-acetaminophen (NORCO/VICODIN) 5-325 MG tablet Take 1 tablet by mouth every 6 (six) hours as needed for moderate pain. 120 tablet 0  . ipratropium-albuterol (DUONEB) 0.5-2.5 (3) MG/3ML SOLN Take 3 mLs by nebulization every 4 (four) hours as needed (shortness of breath or wheezing). 360 mL 0  . MAGNESIUM-OXIDE 400 (241.3 Mg) MG tablet TAKE 1 TABLET BY MOUTH TWICE DAILY 60 tablet 1  . metoCLOPramide (REGLAN) 5 MG tablet Take 1 tablet (5 mg total) by mouth 3 (three) times daily before meals. 90 tablet 2  . Multiple Vitamin (MULTIVITAMIN WITH  MINERALS) TABS tablet Take 1 tablet by mouth daily.    . potassium chloride (K-DUR) 10 MEQ tablet Take 1 tablet (10 mEq total) by mouth 2 (two) times daily. 180 tablet 3  . prochlorperazine (COMPAZINE) 10 MG tablet TAKE 1 TABLET(10 MG) BY MOUTH EVERY 6 HOURS AS NEEDED FOR NAUSEA OR VOMITING 30 tablet 0  . QUEtiapine (SEROQUEL) 100 MG tablet Take 1.5 tablets (150 mg total) by mouth at bedtime. 45 tablet 0  . rOPINIRole (REQUIP) 0.25 MG tablet Take 2 tablets (0.5 mg total) by mouth at bedtime. 180 tablet 0  . torsemide (DEMADEX) 20 MG tablet Take 1 tablet (20 mg total) by mouth as needed. 90 tablet 3  . VENTOLIN HFA 108 (90 Base) MCG/ACT inhaler INHALE 2 PUFFS INTO THE LUNGS EVERY 6 HOURS AS NEEDED FOR WHEEZING OR SHORTNESS OF BREATH (Patient taking differently: Inhale 2 puffs into the lungs every 6 (six) hours as needed for wheezing or shortness of breath. ) 18 g 0  . loratadine (CLARITIN) 10 MG tablet Take 10 mg by mouth daily.    . ondansetron (ZOFRAN) 4 MG tablet TAKE 1 TABLET(4 MG) BY MOUTH TWICE DAILY AS NEEDED  FOR NAUSEA OR VOMITING (Patient not taking: No sig reported) 40 tablet 0   No current facility-administered medications for this encounter.     REVIEW OF SYSTEMS:  A 10+ POINT REVIEW OF SYSTEMS WAS OBTAINED including neurology, dermatology, psychiatry, cardiac, respiratory, lymph, extremities, GI, GU, musculoskeletal, constitutional, reproductive, HEENT. She is currently on steroids by an outside provider. She is generally without complaints today. She denies pain, including chest pain, shortness of breath, cough, hemoptysis, fever or chills, nausea or vomiting, diarrhea or constipation, and headache or visual changes. She reports a history of falls during chemotherapy, but today she has steady gait without assistive device.   PHYSICAL EXAM:  height is 5\' 2"  (1.575 m) and weight is 155 lb 6 oz (70.5 kg). Her temporal temperature is 98.3 F (36.8 C). Her blood pressure is 136/75 and her pulse is 140 (abnormal). Her respiration is 20 and oxygen saturation is 97%.   General: Alert and oriented, in no acute distress HEENT: Head is normocephalic. Extraocular movements are intact. Oropharynx is clear. Dentures in place. Neck: Neck is supple, no palpable cervical or supraclavicular lymphadenopathy. Heart: Regular in rate and rhythm with no murmurs, rubs, or gallops. Chest: Clear to auscultation bilaterally, with no rhonchi, wheezes, or rales. Small thoracotomy scar on the right side. Abdomen: Soft, nontender, nondistended, with no rigidity or guarding. Extremities: No cyanosis or edema. Lymphatics: see Neck Exam Skin: No concerning lesions. Musculoskeletal: symmetric strength and muscle tone throughout. Neurologic: Cranial nerves II through XII are grossly intact. No obvious focalities. Speech is fluent. Coordination is intact. Psychiatric: Judgment and insight are intact. Affect is appropriate.   ECOG = 1  0 - Asymptomatic (Fully active, able to carry on all predisease activities without  restriction)  1 - Symptomatic but completely ambulatory (Restricted in physically strenuous activity but ambulatory and able to carry out work of a light or sedentary nature. For example, light housework, office work)  2 - Symptomatic, <50% in bed during the day (Ambulatory and capable of all self care but unable to carry out any work activities. Up and about more than 50% of waking hours)  3 - Symptomatic, >50% in bed, but not bedbound (Capable of only limited self-care, confined to bed or chair 50% or more of waking hours)  4 - Bedbound (Completely disabled. Cannot  carry on any self-care. Totally confined to bed or chair)  5 - Death   Eustace Pen MM, Creech RH, Tormey DC, et al. [phone]). "Toxicity and response criteria of the [employer]". [place] Oncol. 5 (6): 649-55  LABORATORY DATA:  Lab Results  Component Value Date   WBC 20.6 (H) 07/30/2019   HGB 11.8 (L) 07/30/2019   HCT 36.6 07/30/2019   MCV 97.6 07/30/2019   PLT 307 07/30/2019   NEUTROABS 16.7 (H) 07/30/2019   Lab Results  Component Value Date   NA 137 07/30/2019   K 4.3 07/30/2019   CL 96 (L) 07/30/2019   CO2 30 07/30/2019   GLUCOSE 103 (H) 07/30/2019   CREATININE 0.73 07/30/2019   CALCIUM 10.3 07/30/2019      RADIOGRAPHY: Ct Chest W Contrast  Result Date: 07/26/2019 CLINICAL DATA:  Restaging lung cancer, s/p right upper lobectomy and chemotherapy EXAM: CT CHEST WITH CONTRAST TECHNIQUE: Multidetector CT imaging of the chest was performed during intravenous contrast administration. CONTRAST:  60mL OMNIPAQUE IOHEXOL 300 MG/ML  SOLN COMPARISON:  06/13/2019, 02/15/2019 FINDINGS: Cardiovascular: Aortic atherosclerosis. Normal heart size. Extensive 3 vessel coronary artery calcifications. No pericardial effusion. Mediastinum/Nodes: No enlarged mediastinal, hilar, or axillary lymph nodes. Frothy debris in the trachea. Thyroid gland and esophagus demonstrate no significant findings. Lungs/Pleura: Status  post right upper lobectomy. Mild bandlike scarring of the left lung base. No pleural effusion or pneumothorax. Upper Abdomen: No acute abnormality. Musculoskeletal: No chest wall mass or suspicious bone lesions identified. IMPRESSION: 1. Unchanged postoperative findings status post right upper lobectomy. No evidence of malignant recurrence. 2.  Previously seen small pleural effusions are resolved. 3.  Aortic atherosclerosis and coronary artery disease. Electronically Signed   By: Eddie Candle M.D.   On: 07/26/2019 10:38      IMPRESSION: Limited stage, stage IA (T1b, N0, M0) small cell lung cancer  Patient would be a good candidate for prophylactic cranial irradiation. Given the potential long term effects of radiation therapy (memory issues, dementia), patient is unsure if she would like to proceed with this therapy. Patient does agree to proceed with brain MRI since it has been several months since her previous MRI.  Today, I talked to the patient and family about the findings and work-up thus far.  We discussed the natural history of lung cancer and general treatment, highlighting the role of radiotherapy (prophylactic cranial irradiation) in the management.  We discussed the available radiation techniques, and focused on the details of logistics and delivery.  We reviewed the anticipated acute and late sequelae associated with radiation in this setting.  The patient was encouraged to ask questions that I answered to the best of my ability.   PLAN: Patient will be contacted with results of her brain MRI, and she will then decide whether or not to proceed with cranial irradiation as prophylactic or therapeutic treatment if brain MRI shows metastasis.    ------------------------------------------------  Blair Promise, PhD, MD  This document serves as a record of services personally performed by Gery Pray, MD. It was created on his behalf by Wilburn Mylar, a trained medical scribe. The  creation of this record is based on the scribe's personal observations and the provider's statements to them. This document has been checked and approved by the attending provider.

## 2019-08-11 NOTE — Patient Instructions (Signed)
Coronavirus (COVID-19) Are you at risk?  Are you at risk for the Coronavirus (COVID-19)?  To be considered HIGH RISK for Coronavirus (COVID-19), you have to meet the following criteria:  . Traveled to [place], [place], [place], [place] or [place]; or in the [place] to [place], [place], [place], or [place]; and have fever, cough, and shortness of breath within the last 2 weeks of travel OR . Been in close contact with a person diagnosed with COVID-19 within the last 2 weeks and have fever, cough, and shortness of breath . IF YOU DO NOT MEET THESE CRITERIA, YOU ARE CONSIDERED LOW RISK FOR COVID-19.  What to do if you are HIGH RISK for COVID-19?  . If you are having a medical emergency, call 911. . Seek medical care right away. Before you go to a doctor's office, urgent care or emergency department, call ahead and tell them about your recent travel, contact with someone diagnosed with COVID-19, and your symptoms. You should receive instructions from your physician's office regarding next steps of care.  . When you arrive at healthcare provider, tell the healthcare staff immediately you have returned from visiting [place], [place], [place], [place] or [place]; or traveled in the [place] to [place], [place], [place], or [place]; in the last two weeks or you have been in close contact with a person diagnosed with COVID-19 in the last 2 weeks.   . Tell the health care staff about your symptoms: fever, cough and shortness of breath. . After you have been seen by a medical provider, you will be either: o Tested for (COVID-19) and discharged home on quarantine except to seek medical care if symptoms worsen, and asked to  - Stay home and avoid contact with others until you get your results (4-5 days)  - Avoid travel on public transportation if possible (such as bus, train, or airplane) or o Sent to the Emergency Department by EMS for evaluation, COVID-19 testing, and possible  admission depending on your condition and test results.  What to do if you are LOW RISK for COVID-19?  Reduce your risk of any infection by using the same precautions used for avoiding the common cold or flu:  . Wash your hands often with soap and warm water for at least 20 seconds.  If soap and water are not readily available, use an alcohol-based hand sanitizer with at least 60% alcohol.  . If coughing or sneezing, cover your mouth and nose by coughing or sneezing into the elbow areas of your shirt or coat, into a tissue or into your sleeve (not your hands). . Avoid shaking hands with others and consider head nods or verbal greetings only. . Avoid touching your eyes, nose, or mouth with unwashed hands.  . Avoid close contact with people who are sick. . Avoid places or events with large numbers of people in one location, like concerts or sporting events. . Carefully consider travel plans you have or are making. . If you are planning any travel outside or inside the [place], visit the CDC's Travelers' Health webpage for the latest health notices. . If you have some symptoms but not all symptoms, continue to monitor at home and seek medical attention if your symptoms worsen. . If you are having a medical emergency, call 911.   ADDITIONAL HEALTHCARE OPTIONS FOR PATIENTS  [facility] / e-Visit: [email or web address]         [facility]: [phone]  [facility]: [phone]                   [facility]: [phone]   

## 2019-08-12 NOTE — Telephone Encounter (Signed)
FYI Spoke to pt and advised that she will need a visit for refill per Dr. Morrison Old last notes. Pt verbalized understanding and an ppt. Has been scheduled virtually for 08/13/2019. Pt also asked about spouse sinus issues. Pt given update on husband issue. Pt and spouse verbalized understanding. No further action needed at this time.

## 2019-08-13 ENCOUNTER — Other Ambulatory Visit: Payer: Self-pay

## 2019-08-13 ENCOUNTER — Other Ambulatory Visit: Payer: Self-pay | Admitting: Family Medicine

## 2019-08-13 ENCOUNTER — Other Ambulatory Visit: Payer: Self-pay | Admitting: Internal Medicine

## 2019-08-13 ENCOUNTER — Telehealth (INDEPENDENT_AMBULATORY_CARE_PROVIDER_SITE_OTHER): Payer: Medicare HMO | Admitting: Family Medicine

## 2019-08-13 ENCOUNTER — Encounter: Payer: Self-pay | Admitting: Family Medicine

## 2019-08-13 VITALS — BP 95/60 | HR 147 | Resp 16 | Ht 62.0 in

## 2019-08-13 DIAGNOSIS — I959 Hypotension, unspecified: Secondary | ICD-10-CM

## 2019-08-13 DIAGNOSIS — R000 Tachycardia, unspecified: Secondary | ICD-10-CM | POA: Diagnosis not present

## 2019-08-13 DIAGNOSIS — K5900 Constipation, unspecified: Secondary | ICD-10-CM

## 2019-08-13 DIAGNOSIS — G894 Chronic pain syndrome: Secondary | ICD-10-CM

## 2019-08-13 DIAGNOSIS — F419 Anxiety disorder, unspecified: Secondary | ICD-10-CM | POA: Diagnosis not present

## 2019-08-13 DIAGNOSIS — R600 Localized edema: Secondary | ICD-10-CM

## 2019-08-13 DIAGNOSIS — R112 Nausea with vomiting, unspecified: Secondary | ICD-10-CM

## 2019-08-13 MED ORDER — DILTIAZEM HCL ER 60 MG PO CP12: 60.0000 mg | ORAL_CAPSULE | Freq: Two times a day (BID) | ORAL | 1 refills | Status: DC

## 2019-08-13 MED ORDER — OXYCODONE HCL 5 MG PO TABS: ORAL_TABLET | ORAL | 0 refills | Status: DC

## 2019-08-13 NOTE — Progress Notes (Signed)
Virtual Visit via Video Note   I connected with Mr Deeds on 08/13/19 by a video enabled telemedicine application and verified that I am speaking with the correct person using two identifiers.  Location patient: home Location provider:work office Persons participating in the virtual visit: patient, provider  I discussed the limitations of evaluation and management by telemedicine and the availability of in person appointments. The patient expressed understanding and agreed to proceed.   HPI: Ms Nunley is a 68 yo female with Hx of COPD,SVT,CAD,and right lung small cell carcinoma who is following on some chronic problems and med changes. She was last seen 07/09/19. She is on hydrocodone-acetaminophen 5-325 mg every 4-6 hours as needed, she has been taking medication 3 times daily, she does not feel the medication is helping. Pain level between 6 and 8/10. She has been on hydrocodone before. She is also taking Tylenol. Recently treated for fecal impaction, she states the problem is now well controlled with OTC laxative.  She denies abdominal pain or urinary symptoms. Nausea and vomiting related to chemo.  Anxiety and insomnia: She is on Cymbalta 60 mg daily and Alprazolam 0.5 mg bid prn. Negative for suicidal thoughts.  HTN: Currently she is on nonpharmacologic treatment, medication were discontinued because of hypotension. HR elevated today. Reviewing records,during recent visits she has been tachycardic. She has Hx of atrial flutter. She is not sure if HR is irregular. She has not noted dizziness or diaphoresis.  She has not noted unusual headache, visual changes, chest pain, worsening dyspnea, or palpitations.  Lab Results  Component Value Date   CREATININE 0.73 07/30/2019   BUN 30 (H) 07/30/2019   NA 137 07/30/2019   K 4.3 07/30/2019   CL 96 (L) 07/30/2019   CO2 30 07/30/2019   She has Hx of LE edema, because she was in bed most of the time edema resolved and Torsemide  discontinued. According to pt, about a week ago [facility] nurse resume torsemide 20 mg twice daily because recurrent lower extremity edema.  ROS: See pertinent positives and negatives per HPI.  Past Medical History:  Diagnosis Date  . Acute on chronic diastolic (congestive) heart failure ([place]) 06/12/2019  . AKI (acute kidney injury) ([place])   . Anemia   . Anxiety   . Arthritis    "back"  . Atrial flutter ([place]) 03/13/2016  . CAD (coronary artery disease)   . Cancer ([place])    skin cancer  . Cataract   . Colon polyp   . Complication of anesthesia    BP dropped during esophagus diltation in 2017-admitted for 3 days.   Marland Kitchen COPD (chronic obstructive pulmonary disease) ([place])   . Depression   . Dysrhythmia   . Emphysema of lung ([place])   . GERD (gastroesophageal reflux disease)   . Hypercholesterolemia   . Lung cancer ([place]) dx'd 01/2019  . Osteoporosis   . Oxygen deficiency     Past Surgical History:  Procedure Laterality Date  . A-FLUTTER ABLATION N/A 05/19/2017   Procedure: A-Flutter Ablation;  Surgeon: Evans Lance, MD;  Location: [place] [facility];  Service: Cardiovascular;  Laterality: N/A;  . APPENDECTOMY    . BASAL CELL CARCINOMA EXCISION    . CARDIAC CATHETERIZATION    . CATARACT EXTRACTION Bilateral   . CHOLECYSTECTOMY    . COLONOSCOPY N/A 06/23/2014   Dr. Rourk:Rectal and colonic polyps-removed, Pancolonic diverticulosis. lymphocytic colitis  . CORONARY ANGIOPLASTY WITH STENT PLACEMENT    . ESOPHAGEAL DILATION N/A 03/13/2016   Procedure:  ESOPHAGEAL DILATION;  Surgeon: Daneil Dolin, MD;  [facility];  Service: Endoscopy;  Laterality: N/A;  . ESOPHAGOGASTRODUODENOSCOPY N/A 03/13/2016   Procedure: ESOPHAGOGASTRODUODENOSCOPY (EGD);  Surgeon: Daneil Dolin, MD;  [facility];  Service: Endoscopy;  Laterality: N/A;  0930  . EYE SURGERY     cataract removed, bilateral  . IR THORACENTESIS ASP PLEURAL SPACE W/IMG GUIDE  02/15/2019  . LUMBAR  LAMINECTOMY/DECOMPRESSION MICRODISCECTOMY N/A 01/19/2018   Procedure: L4-5 DECOMPRESSION;  Surgeon: Marybelle Killings, MD;  Location: [facility];  Service: Orthopedics;  Laterality: N/A;  . VIDEO ASSISTED THORACOSCOPY (VATS)/ LOBECTOMY Right 02/04/2019   Procedure: VIDEO ASSISTED THORACOSCOPY (VATS)/RIGHT UPPER AND MIDDLE LOBECTOMY;  Surgeon: Melrose Nakayama, MD;  Location: [facility];  Service: Thoracic;  Laterality: Right;  Marland Kitchen VIDEO BRONCHOSCOPY N/A 02/14/2019   Procedure: BRONCHOSCOPY WITH SEDATION;  Surgeon: Melrose Nakayama, MD;  Location: [facility] OR;  Service: Thoracic;  Laterality: N/A;    Family History  Problem Relation Age of Onset  . Breast cancer Sister 69  . Heart disease Sister   . Heart attack Father   . Heart disease Father   . Stroke Mother   . Heart disease Mother   . Hyperlipidemia Mother   . Heart attack Sister   . Heart disease Brother        ?heart failure  . Congestive Heart Failure Brother   . Hypertension Son   . Heart disease Maternal Grandfather   . Cancer Cousin        lymphoma  . Hypertension Son   . Stomach cancer Other   . Colon cancer Neg Hx   . Pancreatic cancer Neg Hx     Social History   Socioeconomic History  . Marital status: Married    Spouse name: Not on file  . Number of children: 3  . Years of education: 10  . Highest education level: Not on file  Occupational History  . 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 Financial resource strain: Not on file  . Food insecurity    Worry: Not on file    Inability: Not on file  . Transportation needs    Medical: Not on file    Non-medical: Not on file  Tobacco Use  . Smoking status: Current Every Day Smoker    Packs/day: 0.25    Years: 54.00    Pack years: 13.50    Types: Cigarettes    Start date: 06/14/1966  . Smokeless tobacco: Never Used  . Tobacco comment: 1-2 packs daily. Chantix too expensive; pt states that she quit 2 weeks ago (02/04/19)   Substance and  Sexual Activity  . Alcohol use: No    Alcohol/week: 0.0 standard drinks  . Drug use: No  . Sexual activity: Yes    Birth control/protection: Post-menopausal  Lifestyle  . Physical activity    Days per week: Not on file    Minutes per session: Not on file  . Stress: Not on file  Relationships  . Social Herbalist on phone: Not on file    Gets together: Not on file    Attends religious service: Not on file    Active member of club or organization: Not on file    Attends meetings of clubs or organizations: Not on file    Relationship status: Not on file  . Intimate partner violence    Fear of current or ex partner:  Not on file    Emotionally abused: Not on file    Physically abused: Not on file    Forced sexual activity: Not on file  Other Topics Concern  . Not on file  Social History Narrative   Quit high school and got married in 10th grade, age 57   First husband died of colon cancer at young age   Married again 2018   Lives in own home   Daughter lives with her   Drinks coffee seldom, too much "sweet tea"   Current Outpatient Medications:  .  ALPRAZolam (XANAX) 0.5 MG tablet, Take 1 tablet (0.5 mg total) by mouth 2 (two) times daily., Disp: 60 tablet, Rfl: 2 .  atorvastatin (LIPITOR) 80 MG tablet, TAKE 1 TABLET(80 MG) BY MOUTH DAILY (Patient taking differently: Take 80 mg by mouth daily. ), Disp: 90 tablet, Rfl: 1 .  Cholecalciferol (VITAMIN D) 2000 units CAPS, Take 2,000 Units by mouth daily., Disp: , Rfl:  .  cycloSPORINE (RESTASIS) 0.05 % ophthalmic emulsion, Place 1 drop into both eyes 2 (two) times daily., Disp: , Rfl:  .  dexamethasone (DECADRON) 4 MG tablet, TK 1 T PO QD, Disp: , Rfl:  .  DULoxetine (CYMBALTA) 60 MG capsule, Take 1 capsule (60 mg total) by mouth daily., Disp: 90 capsule, Rfl: 1 .  Fluticasone-Umeclidin-Vilant (TRELEGY ELLIPTA) 100-62.5-25 MCG/INH AEPB, Inhale 1 puff into the lungs daily., Disp: 60 each, Rfl: 3 .  ipratropium-albuterol  (DUONEB) 0.5-2.5 (3) MG/3ML SOLN, Take 3 mLs by nebulization every 4 (four) hours as needed (shortness of breath or wheezing)., Disp: 360 mL, Rfl: 0 .  loratadine (CLARITIN) 10 MG tablet, Take 10 mg by mouth daily., Disp: , Rfl:  .  MAGNESIUM-OXIDE 400 (241.3 Mg) MG tablet, TAKE 1 TABLET BY MOUTH TWICE DAILY, Disp: 60 tablet, Rfl: 1 .  metoCLOPramide (REGLAN) 5 MG tablet, Take 1 tablet (5 mg total) by mouth 3 (three) times daily before meals., Disp: 90 tablet, Rfl: 2 .  Multiple Vitamin (MULTIVITAMIN WITH MINERALS) TABS tablet, Take 1 tablet by mouth daily., Disp: , Rfl:  .  ondansetron (ZOFRAN) 4 MG tablet, TAKE 1 TABLET(4 MG) BY MOUTH TWICE DAILY AS NEEDED FOR NAUSEA OR VOMITING (Patient not taking: No sig reported), Disp: 40 tablet, Rfl: 0 .  potassium chloride (K-DUR) 10 MEQ tablet, Take 1 tablet (10 mEq total) by mouth 2 (two) times daily., Disp: 180 tablet, Rfl: 3 .  prochlorperazine (COMPAZINE) 10 MG tablet, TAKE 1 TABLET(10 MG) BY MOUTH EVERY 6 HOURS AS NEEDED FOR NAUSEA OR VOMITING, Disp: 30 tablet, Rfl: 0 .  QUEtiapine (SEROQUEL) 100 MG tablet, Take 1.5 tablets (150 mg total) by mouth at bedtime., Disp: 45 tablet, Rfl: 0 .  rOPINIRole (REQUIP) 0.25 MG tablet, Take 2 tablets (0.5 mg total) by mouth at bedtime., Disp: 180 tablet, Rfl: 0 .  torsemide (DEMADEX) 20 MG tablet, Take 1 tablet (20 mg total) by mouth as needed., Disp: 90 tablet, Rfl: 3 .  VENTOLIN HFA 108 (90 Base) MCG/ACT inhaler, INHALE 2 PUFFS INTO THE LUNGS EVERY 6 HOURS AS NEEDED FOR WHEEZING OR SHORTNESS OF BREATH (Patient taking differently: Inhale 2 puffs into the lungs every 6 (six) hours as needed for wheezing or shortness of breath. ), Disp: 18 g, Rfl: 0  EXAM:  VITALS per patient if applicable:BP 40/98   Pulse (!) 147   Resp 16 Comment: approx  Ht 5\' 2"  (1.575 m)   SpO2 94% Comment: Not on supplemental O2 at the time  she took pulse O2.  BMI 28.42 kg/m   GENERAL: alert, oriented, appears well and in no acute  distress  HEENT: atraumatic, conjunctiva clear, no obvious abnormalities on inspection of external nose and ears  NECK: normal movements of the head and neck  LUNGS: on inspection no signs of respiratory distress, breathing rate appears normal, no obvious gross SOB, gasping or wheezing  CV: no obvious cyanosis  PSYCH/NEURO: pleasant and cooperative, no obvious depression or anxiety, speech and thought processing grossly intact  ASSESSMENT AND PLAN:  Discussed the following assessment and plan:  Constipation, unspecified constipation type Improved with OTC laxative/stool softener. We discussed side effects of opioids. Instructed about warning signs.  Hypotension, unspecified hypotension type Still BP running low. Adequate hydration. BP has been adequate during prior visits.  Torsemide could aggravate problem.  Chronic anxiety Problem stable  Educated about possible interaction between opioids and benzo meds.  Chronic pain disorder - Plan: oxyCODONE (OXY IR/ROXICODONE) 5 MG immediate release tablet, DISCONTINUED: oxyCODONE (OXY IR/ROXICODONE) 5 MG immediate release tablet Hydrocodone is not helping, so will change to Oxycodone. We clearly discussed side effects. F/U in 4 weeks.  Tachycardia - Plan: diltiazem (CARDIZEM SR) 60 MG 12 hr capsule Hx of atrial flutter,she is not sure if HR is irregular. Diltiazem added today. Instructed to monitor BP and HR daily.  She has an appt with cardiologist on 08/25/19.  Bilateral lower extremity edema Side effects of diuretics discussed. She is on K-DUR 10 meq bid. LE elevation and compression stockings recommended. No changes in torsemide.  Because hydrocodone is not helping, we will try oxycodone 5 mg 1/2 to 1 tablet 3 times daily as needed. We discussed side effects, including constipation.    I discussed the assessment and treatment plan with the patient. She was provided an opportunity to ask questions and all were answered.  She agreed with the plan and demonstrated an understanding of the instructions.   The patient was advised to call back or seek an in-person evaluation if the symptoms worsen or if the condition fails to improve as anticipated.   Return in about 4 weeks (around 09/10/2019) for pain,BP,and HR.   Jamesetta Greenhalgh Martinique, MD

## 2019-08-16 ENCOUNTER — Other Ambulatory Visit: Payer: Self-pay | Admitting: Family Medicine

## 2019-08-16 DIAGNOSIS — G4700 Insomnia, unspecified: Secondary | ICD-10-CM

## 2019-08-16 NOTE — Telephone Encounter (Signed)
Routed to Dr. Martinique for approval

## 2019-08-18 ENCOUNTER — Inpatient Hospital Stay (HOSPITAL_COMMUNITY)
Admission: EM | Admit: 2019-08-18 | Discharge: 2019-08-21 | DRG: 308 | Disposition: A | Discharge status: Home / Self Care | Payer: Medicare HMO | Attending: Internal Medicine | Admitting: Internal Medicine

## 2019-08-18 ENCOUNTER — Telehealth: Payer: Self-pay | Admitting: Family Medicine

## 2019-08-18 ENCOUNTER — Emergency Department (HOSPITAL_COMMUNITY): Payer: Medicare HMO

## 2019-08-18 ENCOUNTER — Other Ambulatory Visit: Payer: Self-pay

## 2019-08-18 ENCOUNTER — Encounter (HOSPITAL_COMMUNITY): Payer: Self-pay

## 2019-08-18 DIAGNOSIS — J439 Emphysema, unspecified: Secondary | ICD-10-CM | POA: Diagnosis present

## 2019-08-18 DIAGNOSIS — I5032 Chronic diastolic (congestive) heart failure: Secondary | ICD-10-CM | POA: Diagnosis present

## 2019-08-18 DIAGNOSIS — Z8249 Family history of ischemic heart disease and other diseases of the circulatory system: Secondary | ICD-10-CM

## 2019-08-18 DIAGNOSIS — J9611 Chronic respiratory failure with hypoxia: Secondary | ICD-10-CM | POA: Diagnosis present

## 2019-08-18 DIAGNOSIS — I5033 Acute on chronic diastolic (congestive) heart failure: Secondary | ICD-10-CM | POA: Diagnosis not present

## 2019-08-18 DIAGNOSIS — Z8349 Family history of other endocrine, nutritional and metabolic diseases: Secondary | ICD-10-CM

## 2019-08-18 DIAGNOSIS — Z85828 Personal history of other malignant neoplasm of skin: Secondary | ICD-10-CM | POA: Diagnosis not present

## 2019-08-18 DIAGNOSIS — I872 Venous insufficiency (chronic) (peripheral): Secondary | ICD-10-CM | POA: Diagnosis present

## 2019-08-18 DIAGNOSIS — Z885 Allergy status to narcotic agent status: Secondary | ICD-10-CM

## 2019-08-18 DIAGNOSIS — F17210 Nicotine dependence, cigarettes, uncomplicated: Secondary | ICD-10-CM | POA: Diagnosis present

## 2019-08-18 DIAGNOSIS — I4891 Unspecified atrial fibrillation: Secondary | ICD-10-CM | POA: Diagnosis present

## 2019-08-18 DIAGNOSIS — E876 Hypokalemia: Secondary | ICD-10-CM | POA: Diagnosis present

## 2019-08-18 DIAGNOSIS — Z66 Do not resuscitate: Secondary | ICD-10-CM | POA: Diagnosis not present

## 2019-08-18 DIAGNOSIS — I959 Hypotension, unspecified: Secondary | ICD-10-CM | POA: Diagnosis not present

## 2019-08-18 DIAGNOSIS — Z902 Acquired absence of lung [part of]: Secondary | ICD-10-CM | POA: Diagnosis not present

## 2019-08-18 DIAGNOSIS — Z79891 Long term (current) use of opiate analgesic: Secondary | ICD-10-CM

## 2019-08-18 DIAGNOSIS — Z807 Family history of other malignant neoplasms of lymphoid, hematopoietic and related tissues: Secondary | ICD-10-CM

## 2019-08-18 DIAGNOSIS — Z20828 Contact with and (suspected) exposure to other viral communicable diseases: Secondary | ICD-10-CM | POA: Diagnosis present

## 2019-08-18 DIAGNOSIS — D649 Anemia, unspecified: Secondary | ICD-10-CM | POA: Diagnosis present

## 2019-08-18 DIAGNOSIS — Z9221 Personal history of antineoplastic chemotherapy: Secondary | ICD-10-CM

## 2019-08-18 DIAGNOSIS — M810 Age-related osteoporosis without current pathological fracture: Secondary | ICD-10-CM | POA: Diagnosis present

## 2019-08-18 DIAGNOSIS — G894 Chronic pain syndrome: Secondary | ICD-10-CM | POA: Diagnosis present

## 2019-08-18 DIAGNOSIS — Z9981 Dependence on supplemental oxygen: Secondary | ICD-10-CM | POA: Diagnosis not present

## 2019-08-18 DIAGNOSIS — Z803 Family history of malignant neoplasm of breast: Secondary | ICD-10-CM

## 2019-08-18 DIAGNOSIS — R600 Localized edema: Secondary | ICD-10-CM | POA: Diagnosis not present

## 2019-08-18 DIAGNOSIS — Z79899 Other long term (current) drug therapy: Secondary | ICD-10-CM

## 2019-08-18 DIAGNOSIS — R0602 Shortness of breath: Secondary | ICD-10-CM | POA: Diagnosis present

## 2019-08-18 DIAGNOSIS — Z955 Presence of coronary angioplasty implant and graft: Secondary | ICD-10-CM

## 2019-08-18 DIAGNOSIS — Z0189 Encounter for other specified special examinations: Secondary | ICD-10-CM | POA: Diagnosis not present

## 2019-08-18 DIAGNOSIS — Z823 Family history of stroke: Secondary | ICD-10-CM

## 2019-08-18 DIAGNOSIS — C3491 Malignant neoplasm of unspecified part of right bronchus or lung: Secondary | ICD-10-CM | POA: Diagnosis present

## 2019-08-18 DIAGNOSIS — I483 Typical atrial flutter: Principal | ICD-10-CM | POA: Diagnosis present

## 2019-08-18 DIAGNOSIS — Z800 Family history of malignant neoplasm of digestive organs: Secondary | ICD-10-CM

## 2019-08-18 DIAGNOSIS — Z86010 Personal history of colonic polyps: Secondary | ICD-10-CM

## 2019-08-18 DIAGNOSIS — F418 Other specified anxiety disorders: Secondary | ICD-10-CM | POA: Diagnosis not present

## 2019-08-18 DIAGNOSIS — I4892 Unspecified atrial flutter: Secondary | ICD-10-CM | POA: Diagnosis present

## 2019-08-18 DIAGNOSIS — I452 Bifascicular block: Secondary | ICD-10-CM | POA: Diagnosis not present

## 2019-08-18 DIAGNOSIS — I2510 Atherosclerotic heart disease of native coronary artery without angina pectoris: Secondary | ICD-10-CM | POA: Diagnosis present

## 2019-08-18 DIAGNOSIS — F1320 Sedative, hypnotic or anxiolytic dependence, uncomplicated: Secondary | ICD-10-CM | POA: Diagnosis present

## 2019-08-18 DIAGNOSIS — F419 Anxiety disorder, unspecified: Secondary | ICD-10-CM | POA: Diagnosis present

## 2019-08-18 DIAGNOSIS — E7800 Pure hypercholesterolemia, unspecified: Secondary | ICD-10-CM | POA: Diagnosis present

## 2019-08-18 DIAGNOSIS — J449 Chronic obstructive pulmonary disease, unspecified: Secondary | ICD-10-CM | POA: Diagnosis not present

## 2019-08-18 DIAGNOSIS — I471 Supraventricular tachycardia: Secondary | ICD-10-CM | POA: Diagnosis not present

## 2019-08-18 LAB — BASIC METABOLIC PANEL
Anion gap: 14 (ref 5–15)
BUN: 30 mg/dL — ABNORMAL HIGH (ref 8–23)
CO2: 31 mmol/L (ref 22–32)
Calcium: 9.6 mg/dL (ref 8.9–10.3)
Chloride: 92 mmol/L — ABNORMAL LOW (ref 98–111)
Creatinine, Ser: 0.69 mg/dL (ref 0.44–1.00)
GFR calc Af Amer: >60 mL/min (ref >60)
GFR calc non Af Amer: >60 mL/min (ref >60)
Glucose, Bld: 113 mg/dL — ABNORMAL HIGH (ref 70–99)
Potassium: 3.5 mmol/L (ref 3.5–5.1)
Sodium: 137 mmol/L (ref 135–145)

## 2019-08-18 LAB — CBC WITH DIFFERENTIAL/PLATELET
Abs Immature Granulocytes: 0.24 10*3/uL — ABNORMAL HIGH (ref 0.00–0.07)
Basophils Absolute: 0 10*3/uL (ref 0.0–0.1)
Basophils Relative: 0 %
Eosinophils Absolute: 0.1 10*3/uL (ref 0.0–0.5)
Eosinophils Relative: 1 %
HCT: 33.2 % — ABNORMAL LOW (ref 36.0–46.0)
Hemoglobin: 10.4 g/dL — ABNORMAL LOW (ref 12.0–15.0)
Immature Granulocytes: 2 %
Lymphocytes Relative: 30 %
Lymphs Abs: 3.3 10*3/uL (ref 0.7–4.0)
MCH: 32.3 pg (ref 26.0–34.0)
MCHC: 31.3 g/dL (ref 30.0–36.0)
MCV: 103.1 fL — ABNORMAL HIGH (ref 80.0–100.0)
Monocytes Absolute: 0.7 10*3/uL (ref 0.1–1.0)
Monocytes Relative: 6 %
Neutro Abs: 6.6 10*3/uL (ref 1.7–7.7)
Neutrophils Relative %: 61 %
Platelets: 235 10*3/uL (ref 150–400)
RBC: 3.22 MIL/uL — ABNORMAL LOW (ref 3.87–5.11)
RDW: 16.1 % — ABNORMAL HIGH (ref 11.5–15.5)
WBC: 11 10*3/uL — ABNORMAL HIGH (ref 4.0–10.5)
nRBC: 0.2 % (ref 0.0–0.2)

## 2019-08-18 LAB — BRAIN NATRIURETIC PEPTIDE: B Natriuretic Peptide: 84.5 pg/mL (ref 0.0–100.0)

## 2019-08-18 LAB — MAGNESIUM: Magnesium: 2.2 mg/dL (ref 1.7–2.4)

## 2019-08-18 MED ORDER — DILTIAZEM LOAD VIA INFUSION
15.0000 mg | Freq: Once | INTRAVENOUS | Status: AC
  Administered 2019-08-18: 15 mg via INTRAVENOUS
  Filled 2019-08-18: qty 15

## 2019-08-18 MED ORDER — ADENOSINE 6 MG/2ML IV SOLN
6.0000 mg | Freq: Once | INTRAVENOUS | Status: AC
  Administered 2019-08-18: 22:00:00 6 mg via INTRAVENOUS
  Filled 2019-08-18: qty 2

## 2019-08-18 MED ORDER — DILTIAZEM HCL 100 MG IV SOLR
5.0000 mg/h | INTRAVENOUS | Status: DC
  Administered 2019-08-18: 5 mg/h via INTRAVENOUS
  Filled 2019-08-18: qty 100

## 2019-08-18 NOTE — Telephone Encounter (Signed)
Report - Tammy seen the Pt yesterday around 5pm. Her weight is up, her swelling is up, she had a tele health visit on the 11th and was ordered CARDIZEM for elevated heart rate and she will finally get it today due to pharmacy having to order it. The Pts Heart rate was in the upper 150's while Tammy was there. She has edema on her upper thighs. She was given torsemide and her husband has been filling her med planner but it was a mess and Tammy corrected it for the next 2 days. Tammy will see Pt again onTthrusday. Pt ran out of DULoxetine and will probably need extra Torsemide. Pt has discontinued the medications below REGLAN Loratadine Tapered off of Dexamethasone / please advise

## 2019-08-18 NOTE — ED Provider Notes (Signed)
[facility] DEPT Provider Note   CSN: [id number] Arrival date & time: 08/18/19  2055     History   Chief Complaint Chief Complaint  Patient presents with   Shortness of Breath    HPI Tonya Bradley is a 68 y.o. female.     HPI Pt has a history of CHF and Cancer.  Pt states she has been short of breath for a while now.  Yesterday her palliative care nurse saw her and told her, her heart rate was in the 160s.  The nurse wanted her to come but she did not want to.  Today she felt worse.  Pt smokes cigarettes and is trying to quit.  SHe is feeling more short of breath. Any activity makes it worse.  No fever or chills.   But she does feel hot sometimes.  Per last oncology note on 8/31 there was no active ca on her CT scan. Past Medical History:  Diagnosis Date   Acute on chronic diastolic (congestive) heart failure ([place]) 06/12/2019   AKI (acute kidney injury) ([place])    Anemia    Anxiety    Arthritis    "back"   Atrial flutter ([place]) 03/13/2016   CAD (coronary artery disease)    Cancer (HCC)    skin cancer   Cataract    Colon polyp    Complication of anesthesia    BP dropped during esophagus diltation in 2017-admitted for 3 days.    COPD (chronic obstructive pulmonary disease) (HCC)    Depression    Dysrhythmia    Emphysema of lung (HCC)    GERD (gastroesophageal reflux disease)    Hypercholesterolemia    Lung cancer ([place]) dx'd 01/2019   Osteoporosis    Oxygen deficiency     Patient Active Problem List   Diagnosis Date Noted   HCAP (healthcare-associated pneumonia) 07/03/2019   Diarrhea 07/03/2019   Anemia 07/03/2019   SVT (supraventricular tachycardia) ([place]) 06/17/2019   Acute respiratory failure with hypoxemia (HCC)    Shock ([place])    AKI (acute kidney injury) ([place])    Acute on chronic diastolic (congestive) heart failure ([place]) 06/12/2019   Chemotherapy induced nausea and vomiting 05/26/2019   Goals of  care, counseling/discussion 03/25/2019   Encounter for antineoplastic chemotherapy 03/25/2019   Small cell lung cancer, right ([place]) 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   S/P Right Upper Lobectomy, Right Middle Lobectomy 02/04/2019   Colitis 01/01/2019   Dehydration 01/01/2019   Hypokalemia 01/01/2019   Dysphonia 06/19/2018   Bilateral lower extremity edema 02/16/2018   Lumbar stenosis 01/19/2018   GERD (gastroesophageal reflux disease) 11/18/2017   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   COPD (chronic obstructive pulmonary disease) ([place]) 09/09/2017   Varicose veins of bilateral lower extremities with other complications 34/19/3790   Chronic pain disorder 05/26/2017   Back pain 05/26/2017   Insomnia 05/26/2017   Osteopenia 11/26/2016   Other emphysema ([place]) 11/07/2016   Chronic anxiety 11/07/2016   Chronic hypoxemic respiratory failure ([place]) 11/07/2016   Constipation 06/12/2016   Right carotid bruit 04/03/2016   CAD (coronary artery disease) 04/03/2016   Tobacco abuse 04/03/2016   Atrial flutter ([place]) 03/13/2016   Hiatal hernia    Reflux esophagitis    Schatzki's ring    Dysphagia 03/01/2016   Solitary pulmonary nodule 03/01/2016   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 Rourk:Rectal and colonic polyps-removed, Pancolonic diverticulosis. lymphocytic colitis   CORONARY ANGIOPLASTY WITH STENT PLACEMENT     ESOPHAGEAL DILATION N/A  03/13/2016   Procedure: ESOPHAGEAL DILATION;  Surgeon: Daneil Dolin, MD;  [facility];  Service: Endoscopy;  Laterality: N/A;   ESOPHAGOGASTRODUODENOSCOPY N/A 03/13/2016   Procedure: ESOPHAGOGASTRODUODENOSCOPY (EGD);  Surgeon: Daneil Dolin, MD;  [facility];  Service: Endoscopy;  Laterality: N/A;  0930   EYE SURGERY     cataract removed, bilateral   IR THORACENTESIS ASP PLEURAL SPACE W/IMG GUIDE  02/15/2019   LUMBAR LAMINECTOMY/DECOMPRESSION MICRODISCECTOMY N/A 01/19/2018   Procedure: L4-5 DECOMPRESSION;  Surgeon: Marybelle Killings, MD;  Location: [facility];  Service: Orthopedics;  Laterality: N/A;   VIDEO ASSISTED THORACOSCOPY (VATS)/ LOBECTOMY Right 02/04/2019   Procedure: VIDEO ASSISTED THORACOSCOPY (VATS)/RIGHT UPPER AND MIDDLE LOBECTOMY;  Surgeon: Melrose Nakayama, MD;  Location: [facility];  Service: Thoracic;  Laterality: Right;   VIDEO BRONCHOSCOPY N/A 02/14/2019   Procedure: BRONCHOSCOPY WITH SEDATION;  Surgeon: Melrose Nakayama, MD;  Location: [facility];  Service: Thoracic;  Laterality: N/A;     OB History   No obstetric history on file.      Home Medications    Prior to Admission medications   Medication Sig Start Date End Date Taking? Authorizing Provider  ALPRAZolam Duanne Moron) 0.5 MG tablet Take 1 tablet (0.5 mg total) by mouth 2 (two) times daily. 07/09/19  Yes Martinique, [name], MD  atorvastatin (LIPITOR) 80 MG tablet TAKE 1 TABLET(80 MG) BY MOUTH DAILY Patient taking differently: Take 80 mg by mouth daily.  04/28/19  Yes Martinique, [name], MD  Cholecalciferol (VITAMIN D) 2000 units CAPS Take 2,000 Units by mouth daily.   Yes [provider]  cycloSPORINE (RESTASIS) 0.05 % ophthalmic emulsion Place 1 drop into both eyes 2 (two) times daily.   Yes [provider]  dexamethasone (DECADRON) 4 MG tablet Take 2 mg by mouth daily.  07/19/19  Yes [provider]  diltiazem (CARDIZEM SR) 60 MG 12 hr capsule Take 1 capsule (60 mg total) by  mouth 2 (two) times daily. 08/13/19  Yes Martinique, [name], MD  Fluticasone-Umeclidin-Vilant (TRELEGY ELLIPTA) 100-62.5-25 MCG/INH AEPB Inhale 1 puff into the lungs daily. 06/29/19  Yes Martinique, [name], MD  HYDROcodone-acetaminophen (NORCO/VICODIN) 5-325 MG tablet Take 1 tablet by mouth every 6 (six) hours as needed for moderate pain.   Yes [provider]  ipratropium-albuterol (DUONEB) 0.5-2.5 (3) MG/3ML SOLN Take 3 mLs by nebulization every 4 (four) hours as needed (shortness of breath or wheezing). 06/19/19  Yes Donne Hazel, MD  MAGNESIUM-OXIDE 400 (241.3 Mg) MG tablet TAKE 1 TABLET BY MOUTH TWICE DAILY Patient taking differently: Take 400 mg by mouth 2 (two) times daily.  07/30/19  Yes Curt Bears, MD  Multiple Vitamin (MULTIVITAMIN WITH MINERALS) TABS tablet Take 1 tablet by mouth daily.   Yes [provider]  prochlorperazine (COMPAZINE) 10 MG tablet TAKE 1 TABLET(10 MG) BY MOUTH EVERY 6 HOURS AS NEEDED FOR NAUSEA OR VOMITING Patient taking differently: Take 10 mg by  mouth every 6 (six) hours as needed for nausea or vomiting.  08/16/19  Yes Curt Bears, MD  QUEtiapine (SEROQUEL) 100 MG tablet TAKE 1 AND 1/2 TABLETS(150 MG) BY MOUTH AT BEDTIME Patient taking differently: Take 150 mg by mouth at bedtime.  08/17/19  Yes Martinique, [name], MD  rOPINIRole (REQUIP) 0.25 MG tablet Take 2 tablets (0.5 mg total) by mouth at bedtime. 06/28/19  Yes Martinique, [name], MD  torsemide (DEMADEX) 20 MG tablet Take 1 tablet (20 mg total) by mouth as needed. Patient taking differently: Take 20 mg by mouth as needed (fluid retention).  07/01/19  Yes Simmons, Brittainy M, PA-C  VENTOLIN HFA 108 (90 Base) MCG/ACT inhaler INHALE 2 PUFFS INTO THE LUNGS EVERY 6 HOURS AS NEEDED FOR WHEEZING OR SHORTNESS OF BREATH Patient taking differently: Inhale 2 puffs into the lungs every 6 (six) hours as needed for wheezing or shortness of breath.  09/19/17  Yes Martinique, [name], MD  DULoxetine (CYMBALTA) 60 MG  capsule Take 1 capsule (60 mg total) by mouth daily. Patient not taking: Reported on 08/18/2019 10/06/18   Martinique, [name], MD  metoCLOPramide (REGLAN) 5 MG tablet Take 1 tablet (5 mg total) by mouth 3 (three) times daily before meals. Patient not taking: Reported on 08/18/2019 05/11/19   Melrose Nakayama, MD  ondansetron (ZOFRAN) 4 MG tablet TAKE 1 TABLET(4 MG) BY MOUTH TWICE DAILY AS NEEDED FOR NAUSEA OR VOMITING Patient not taking: No sig reported 05/11/19   Martinique, [name], MD  oxyCODONE (OXY IR/ROXICODONE) 5 MG immediate release tablet 1/2 to 1 tab every 8 hours as needed. Patient not taking: Reported on 08/18/2019 08/13/19   Martinique, [name], MD  potassium chloride (K-DUR) 10 MEQ tablet Take 1 tablet (10 mEq total) by mouth 2 (two) times daily. Patient not taking: Reported on 08/18/2019 05/27/19   Evans Lance, MD    Family History Family History  Problem Relation Age of Onset   Breast cancer Sister 47   Heart disease Sister    Heart attack Father    Heart disease Father    Stroke Mother    Heart disease Mother    Hyperlipidemia Mother    Heart attack Sister    Heart disease Brother        ?heart failure   Congestive Heart Failure Brother    Hypertension Son    Heart disease Maternal Grandfather    Cancer Cousin        lymphoma   Hypertension Son    Stomach cancer Other    Colon cancer Neg Hx    Pancreatic cancer Neg Hx     Social History Social History   Tobacco Use   Smoking status: Current Every Day Smoker    Packs/day: 0.25    Years: 54.00    Pack years: 13.50    Types: Cigarettes    Start date: 06/14/1966   Smokeless tobacco: Never Used   Tobacco comment: 1-2 packs daily. Chantix too expensive; pt states that she quit 2 weeks ago (02/04/19)   Substance Use Topics   Alcohol use: No    Alcohol/week: 0.0 standard drinks   Drug use: No     Allergies   Morphine and related   Review of Systems Review of Systems  All other systems  reviewed and are negative.    Physical Exam Updated Vital Signs BP 95/60    Pulse 76    Temp 97.8 F (36.6 C) (Oral)    Resp Marland Kitchen)  23    SpO2 92%   Physical Exam Vitals signs and nursing note reviewed.  Constitutional:      General: She is not in acute distress.    Appearance: She is well-developed.  HENT:     Head: Normocephalic and atraumatic.     Right Ear: External ear normal.     Left Ear: External ear normal.  Eyes:     General: No scleral icterus.       Right eye: No discharge.        Left eye: No discharge.     Conjunctiva/sclera: Conjunctivae normal.  Neck:     Musculoskeletal: Neck supple.     Trachea: No tracheal deviation.  Cardiovascular:     Rate and Rhythm: Regular rhythm. Tachycardia present.  Pulmonary:     Effort: Pulmonary effort is normal. No respiratory distress.     Breath sounds: No stridor. Wheezing present. No rales.  Abdominal:     General: Bowel sounds are normal. There is no distension.     Palpations: Abdomen is soft.     Tenderness: There is no abdominal tenderness. There is no guarding or rebound.  Musculoskeletal:     Right lower leg: She exhibits no tenderness. Edema present.     Left lower leg: She exhibits no tenderness. Edema present.  Skin:    General: Skin is warm and dry.     Findings: No rash.  Neurological:     Mental Status: She is alert.     Cranial Nerves: No cranial nerve deficit (no facial droop, extraocular movements intact, no slurred speech).     Sensory: No sensory deficit.     Motor: No abnormal muscle tone or seizure activity.     Coordination: Coordination normal.      ED Treatments / Results  Labs (all labs ordered are listed, but only abnormal results are displayed) Labs Reviewed  CBC WITH DIFFERENTIAL/PLATELET - Abnormal; Notable for the following components:      Result Value   WBC 11.0 (*)    RBC 3.22 (*)    Hemoglobin 10.4 (*)    HCT 33.2 (*)    MCV 103.1 (*)    RDW 16.1 (*)    Abs Immature  Granulocytes 0.24 (*)    All other components within normal limits  BASIC METABOLIC PANEL - Abnormal; Notable for the following components:   Chloride 92 (*)    Glucose, Bld 113 (*)    BUN 30 (*)    All other components within normal limits  SARS CORONAVIRUS 2 (TAT 6-24 HRS)  MAGNESIUM  BRAIN NATRIURETIC PEPTIDE    EKG EKG Interpretation  Date/Time:  Wednesday August 18 2019 21:06:57 EDT Ventricular Rate:  158 PR Interval:    QRS Duration: 110 QT Interval:  300 QTC Calculation: 487 R Axis:   -57 Text Interpretation:  Supraventricular tachycardia , ?atrial flutter Incomplete RBBB and LAFB Abnormal R-wave progression, early transition Baseline wander in lead(s) V1 Confirmed by Dorie Rank [phone]) on 08/18/2019 9:09:40 PM   Radiology Dg Chest Portable 1 View  Result Date: 08/18/2019 CLINICAL DATA:  Shortness of breath attack EXAM: PORTABLE CHEST 1 VIEW COMPARISON:  07/26/2019 07/03/2019 FINDINGS: Cardiac shadow is within normal limits. Aortic calcifications are seen. Volume loss on the right is noted consistent with the prior surgical history. No focal infiltrate or sizable effusion is noted. No bony abnormality is seen. IMPRESSION: Volume loss on the right consistent with prior lobectomy. No acute abnormality noted. Electronically Signed  By: Inez Catalina M.D.   On: 08/18/2019 21:44    Procedures .Critical Care Performed by: Dorie Rank, MD Authorized by: Dorie Rank, MD   Critical care provider statement:    Critical care time (minutes):  45   Critical care was time spent personally by me on the following activities:  Discussions with consultants, evaluation of patient's response to treatment, examination of patient, ordering and performing treatments and interventions, ordering and review of laboratory studies, ordering and review of radiographic studies, pulse oximetry, re-evaluation of patient's condition, obtaining history from patient or surrogate and review of old  charts   (including critical care time)  Medications Ordered in ED Medications  diltiazem (CARDIZEM) 1 mg/mL load via infusion 15 mg (15 mg Intravenous Bolus from Bag 08/18/19 2207)    And  diltiazem (CARDIZEM) 100 mg in dextrose 5 % 100 mL (1 mg/mL) infusion (7.5 mg/hr Intravenous Rate/Dose Change 08/18/19 2258)  adenosine (ADENOCARD) 6 MG/2ML injection 6 mg (6 mg Intravenous Given 08/18/19 2155)     Initial Impression / Assessment and Plan / ED Course  I have reviewed the triage vital signs and the nursing notes.  Pertinent labs & imaging results that were available during my care of the patient were reviewed by me and considered in my medical decision making (see chart for details).  Clinical Course as of Aug 18 2319  Wed Aug 18, 2019  2158 Patient was given 6 mg of adenosine.  Flutter waves were easily distinguished.  Patient's heart rates back in the 150s 160s.  I will start her on a Cardizem drip.   [JK]  2318 Labs notable for stable anemia.   [JK]  2319 Chest x-ray without acute findings.  Heart rate has improved with Cardizem infusion   [JK]    Clinical Course User Index [JK] Dorie Rank, MD     Patient presented to the emergency room for evaluation of shortness of breath.  Patient was noted to be tachycardic on arrival.  Her EKG showed a narrow complex tachycardia.  Adenosine was given and her flutter waves were very evident.  Patient was started on a Cardizem infusion.  Her heart rate has slowly improved.  We did have some mild hypotension but that has improved as well with some IV hydration.  Patient is continuing on her effusion.  I will consult the medical service for admission and further stabilization.  Final Clinical Impressions(s) / ED Diagnoses   Final diagnoses:  Atrial flutter with rapid ventricular response [facility])    ED Discharge Orders    None       Dorie Rank, MD 08/18/19 2321

## 2019-08-18 NOTE — ED Notes (Addendum)
Patients BP 95/69, per Dr. Tomi Bamberger titrate cardizem from 5 mg/hr to 7.5 mg/hr.

## 2019-08-18 NOTE — Telephone Encounter (Signed)
Message sent to Dr. Jordan for review. 

## 2019-08-19 DIAGNOSIS — Z0189 Encounter for other specified special examinations: Secondary | ICD-10-CM | POA: Diagnosis not present

## 2019-08-19 DIAGNOSIS — F1320 Sedative, hypnotic or anxiolytic dependence, uncomplicated: Secondary | ICD-10-CM

## 2019-08-19 DIAGNOSIS — I483 Typical atrial flutter: Secondary | ICD-10-CM | POA: Diagnosis present

## 2019-08-19 DIAGNOSIS — R600 Localized edema: Secondary | ICD-10-CM | POA: Diagnosis not present

## 2019-08-19 DIAGNOSIS — G894 Chronic pain syndrome: Secondary | ICD-10-CM

## 2019-08-19 DIAGNOSIS — I4892 Unspecified atrial flutter: Secondary | ICD-10-CM | POA: Diagnosis not present

## 2019-08-19 DIAGNOSIS — J439 Emphysema, unspecified: Secondary | ICD-10-CM | POA: Diagnosis present

## 2019-08-19 DIAGNOSIS — Z20828 Contact with and (suspected) exposure to other viral communicable diseases: Secondary | ICD-10-CM | POA: Diagnosis present

## 2019-08-19 DIAGNOSIS — E7800 Pure hypercholesterolemia, unspecified: Secondary | ICD-10-CM | POA: Diagnosis present

## 2019-08-19 DIAGNOSIS — D649 Anemia, unspecified: Secondary | ICD-10-CM | POA: Diagnosis present

## 2019-08-19 DIAGNOSIS — C3491 Malignant neoplasm of unspecified part of right bronchus or lung: Secondary | ICD-10-CM

## 2019-08-19 DIAGNOSIS — Z9981 Dependence on supplemental oxygen: Secondary | ICD-10-CM | POA: Diagnosis not present

## 2019-08-19 DIAGNOSIS — Z86010 Personal history of colonic polyps: Secondary | ICD-10-CM | POA: Diagnosis not present

## 2019-08-19 DIAGNOSIS — I5032 Chronic diastolic (congestive) heart failure: Secondary | ICD-10-CM | POA: Diagnosis not present

## 2019-08-19 DIAGNOSIS — I452 Bifascicular block: Secondary | ICD-10-CM | POA: Diagnosis present

## 2019-08-19 DIAGNOSIS — J9611 Chronic respiratory failure with hypoxia: Secondary | ICD-10-CM

## 2019-08-19 DIAGNOSIS — Z902 Acquired absence of lung [part of]: Secondary | ICD-10-CM | POA: Diagnosis not present

## 2019-08-19 DIAGNOSIS — E876 Hypokalemia: Secondary | ICD-10-CM | POA: Diagnosis present

## 2019-08-19 DIAGNOSIS — J449 Chronic obstructive pulmonary disease, unspecified: Secondary | ICD-10-CM | POA: Diagnosis not present

## 2019-08-19 DIAGNOSIS — Z85828 Personal history of other malignant neoplasm of skin: Secondary | ICD-10-CM | POA: Diagnosis not present

## 2019-08-19 DIAGNOSIS — Z955 Presence of coronary angioplasty implant and graft: Secondary | ICD-10-CM | POA: Diagnosis not present

## 2019-08-19 DIAGNOSIS — I4891 Unspecified atrial fibrillation: Secondary | ICD-10-CM | POA: Diagnosis present

## 2019-08-19 DIAGNOSIS — Z66 Do not resuscitate: Secondary | ICD-10-CM | POA: Diagnosis present

## 2019-08-19 DIAGNOSIS — M810 Age-related osteoporosis without current pathological fracture: Secondary | ICD-10-CM | POA: Diagnosis present

## 2019-08-19 DIAGNOSIS — I2510 Atherosclerotic heart disease of native coronary artery without angina pectoris: Secondary | ICD-10-CM | POA: Diagnosis present

## 2019-08-19 DIAGNOSIS — F419 Anxiety disorder, unspecified: Secondary | ICD-10-CM | POA: Diagnosis present

## 2019-08-19 DIAGNOSIS — F418 Other specified anxiety disorders: Secondary | ICD-10-CM | POA: Diagnosis not present

## 2019-08-19 DIAGNOSIS — I5033 Acute on chronic diastolic (congestive) heart failure: Secondary | ICD-10-CM | POA: Diagnosis present

## 2019-08-19 DIAGNOSIS — I872 Venous insufficiency (chronic) (peripheral): Secondary | ICD-10-CM | POA: Diagnosis present

## 2019-08-19 DIAGNOSIS — R0602 Shortness of breath: Secondary | ICD-10-CM | POA: Diagnosis present

## 2019-08-19 DIAGNOSIS — F17210 Nicotine dependence, cigarettes, uncomplicated: Secondary | ICD-10-CM | POA: Diagnosis present

## 2019-08-19 DIAGNOSIS — I959 Hypotension, unspecified: Secondary | ICD-10-CM | POA: Diagnosis present

## 2019-08-19 LAB — BASIC METABOLIC PANEL
Anion gap: 9 (ref 5–15)
BUN: 24 mg/dL — ABNORMAL HIGH (ref 8–23)
CO2: 32 mmol/L (ref 22–32)
Calcium: 8.7 mg/dL — ABNORMAL LOW (ref 8.9–10.3)
Chloride: 97 mmol/L — ABNORMAL LOW (ref 98–111)
Creatinine, Ser: 0.53 mg/dL (ref 0.44–1.00)
GFR calc Af Amer: >60 mL/min (ref >60)
GFR calc non Af Amer: >60 mL/min (ref >60)
Glucose, Bld: 126 mg/dL — ABNORMAL HIGH (ref 70–99)
Potassium: 3.3 mmol/L — ABNORMAL LOW (ref 3.5–5.1)
Sodium: 138 mmol/L (ref 135–145)

## 2019-08-19 LAB — CBC
HCT: 30.3 % — ABNORMAL LOW (ref 36.0–46.0)
Hemoglobin: 9.5 g/dL — ABNORMAL LOW (ref 12.0–15.0)
MCH: 32.9 pg (ref 26.0–34.0)
MCHC: 31.4 g/dL (ref 30.0–36.0)
MCV: 104.8 fL — ABNORMAL HIGH (ref 80.0–100.0)
Platelets: 210 10*3/uL (ref 150–400)
RBC: 2.89 MIL/uL — ABNORMAL LOW (ref 3.87–5.11)
RDW: 16.2 % — ABNORMAL HIGH (ref 11.5–15.5)
WBC: 9.7 10*3/uL (ref 4.0–10.5)
nRBC: 0 % (ref 0.0–0.2)

## 2019-08-19 LAB — MRSA PCR SCREENING: MRSA by PCR: NEGATIVE

## 2019-08-19 LAB — SARS CORONAVIRUS 2 (TAT 6-24 HRS): SARS Coronavirus 2: NEGATIVE

## 2019-08-19 LAB — T4, FREE: Free T4: 1.16 ng/dL — ABNORMAL HIGH (ref 0.61–1.12)

## 2019-08-19 LAB — TSH: TSH: 6.706 u[IU]/mL — ABNORMAL HIGH (ref 0.350–4.500)

## 2019-08-19 MED ORDER — ENOXAPARIN SODIUM 40 MG/0.4ML ~~LOC~~ SOLN
40.0000 mg | Freq: Every day | SUBCUTANEOUS | Status: DC
  Filled 2019-08-19: qty 0.4

## 2019-08-19 MED ORDER — ROPINIROLE HCL 0.5 MG PO TABS
0.5000 mg | ORAL_TABLET | Freq: Every day | ORAL | Status: DC
  Administered 2019-08-19: 22:00:00 0.5 mg via ORAL
  Filled 2019-08-19 (×4): qty 1

## 2019-08-19 MED ORDER — ACETAMINOPHEN 325 MG PO TABS
650.0000 mg | ORAL_TABLET | ORAL | Status: DC | PRN
  Administered 2019-08-19: 650 mg via ORAL
  Filled 2019-08-19: qty 2

## 2019-08-19 MED ORDER — QUETIAPINE FUMARATE 50 MG PO TABS
150.0000 mg | ORAL_TABLET | Freq: Every day | ORAL | Status: DC
  Administered 2019-08-19 – 2019-08-20 (×2): 150 mg via ORAL
  Filled 2019-08-19 (×2): qty 1

## 2019-08-19 MED ORDER — AMIODARONE HCL IN DEXTROSE 360-4.14 MG/200ML-% IV SOLN
30.0000 mg/h | INTRAVENOUS | Status: DC
  Administered 2019-08-19 (×2): 30 mg/h via INTRAVENOUS
  Filled 2019-08-19 (×5): qty 200

## 2019-08-19 MED ORDER — DEXAMETHASONE 2 MG PO TABS
2.0000 mg | ORAL_TABLET | Freq: Every day | ORAL | Status: DC
  Administered 2019-08-19 – 2019-08-21 (×2): 2 mg via ORAL
  Filled 2019-08-19 (×3): qty 1

## 2019-08-19 MED ORDER — ALBUTEROL SULFATE (2.5 MG/3ML) 0.083% IN NEBU
2.5000 mg | INHALATION_SOLUTION | Freq: Four times a day (QID) | RESPIRATORY_TRACT | Status: DC | PRN
  Administered 2019-08-20: 2.5 mg via RESPIRATORY_TRACT
  Filled 2019-08-19: qty 3

## 2019-08-19 MED ORDER — HYDROCODONE-ACETAMINOPHEN 5-325 MG PO TABS
1.0000 | ORAL_TABLET | Freq: Four times a day (QID) | ORAL | Status: DC | PRN
  Administered 2019-08-19 – 2019-08-20 (×5): 1 via ORAL
  Filled 2019-08-19 (×6): qty 1

## 2019-08-19 MED ORDER — VITAMIN D3 25 MCG (1000 UNIT) PO TABS
2000.0000 [IU] | ORAL_TABLET | Freq: Every day | ORAL | Status: DC
  Administered 2019-08-20 – 2019-08-21 (×2): 2000 [IU] via ORAL
  Filled 2019-08-19 (×3): qty 2

## 2019-08-19 MED ORDER — MAGNESIUM OXIDE 400 (241.3 MG) MG PO TABS
400.0000 mg | ORAL_TABLET | Freq: Two times a day (BID) | ORAL | Status: DC
  Administered 2019-08-19 – 2019-08-21 (×5): 400 mg via ORAL
  Filled 2019-08-19 (×5): qty 1

## 2019-08-19 MED ORDER — IPRATROPIUM-ALBUTEROL 0.5-2.5 (3) MG/3ML IN SOLN: 3.0000 mL | RESPIRATORY_TRACT | Status: DC | PRN

## 2019-08-19 MED ORDER — ONDANSETRON HCL 4 MG/2ML IJ SOLN: 4.0000 mg | Freq: Four times a day (QID) | INTRAMUSCULAR | Status: DC | PRN

## 2019-08-19 MED ORDER — PROCHLORPERAZINE MALEATE 10 MG PO TABS
10.0000 mg | ORAL_TABLET | Freq: Four times a day (QID) | ORAL | Status: DC | PRN
  Filled 2019-08-19 (×2): qty 1

## 2019-08-19 MED ORDER — FUROSEMIDE 10 MG/ML IJ SOLN
40.0000 mg | Freq: Once | INTRAMUSCULAR | Status: AC
  Administered 2019-08-19: 40 mg via INTRAVENOUS
  Filled 2019-08-19: qty 4

## 2019-08-19 MED ORDER — ATORVASTATIN CALCIUM 40 MG PO TABS
80.0000 mg | ORAL_TABLET | Freq: Every day | ORAL | Status: DC
  Administered 2019-08-19 – 2019-08-20 (×2): 80 mg via ORAL
  Filled 2019-08-19 (×2): qty 2
  Filled 2019-08-19: qty 1

## 2019-08-19 MED ORDER — FLUTICASONE FUROATE-VILANTEROL 100-25 MCG/INH IN AEPB
1.0000 | INHALATION_SPRAY | Freq: Every day | RESPIRATORY_TRACT | Status: DC
  Administered 2019-08-21: 1 via RESPIRATORY_TRACT
  Filled 2019-08-19: qty 28

## 2019-08-19 MED ORDER — APIXABAN 5 MG PO TABS
5.0000 mg | ORAL_TABLET | Freq: Two times a day (BID) | ORAL | Status: DC
  Administered 2019-08-19 – 2019-08-21 (×4): 5 mg via ORAL
  Filled 2019-08-19 (×5): qty 1

## 2019-08-19 MED ORDER — CHLORHEXIDINE GLUCONATE CLOTH 2 % EX PADS
6.0000 | MEDICATED_PAD | Freq: Every day | CUTANEOUS | Status: DC
  Administered 2019-08-19: 17:00:00 6 via TOPICAL

## 2019-08-19 MED ORDER — SODIUM CHLORIDE 0.9% FLUSH: 3.0000 mL | INTRAVENOUS | Status: DC | PRN

## 2019-08-19 MED ORDER — UMECLIDINIUM BROMIDE 62.5 MCG/INH IN AEPB
1.0000 | INHALATION_SPRAY | Freq: Every day | RESPIRATORY_TRACT | Status: DC
  Administered 2019-08-21: 08:00:00 1 via RESPIRATORY_TRACT
  Filled 2019-08-19: qty 7

## 2019-08-19 MED ORDER — SODIUM CHLORIDE 0.9 % IV SOLN
250.0000 mL | INTRAVENOUS | Status: DC
  Administered 2019-08-19: 250 mL via INTRAVENOUS

## 2019-08-19 MED ORDER — AMIODARONE LOAD VIA INFUSION
150.0000 mg | Freq: Once | INTRAVENOUS | Status: AC
  Administered 2019-08-19: 03:00:00 150 mg via INTRAVENOUS
  Filled 2019-08-19: qty 83.34

## 2019-08-19 MED ORDER — CYCLOSPORINE 0.05 % OP EMUL
1.0000 [drp] | Freq: Two times a day (BID) | OPHTHALMIC | Status: DC
  Administered 2019-08-19 – 2019-08-21 (×3): 1 [drp] via OPHTHALMIC
  Filled 2019-08-19 (×6): qty 30

## 2019-08-19 MED ORDER — ADULT MULTIVITAMIN W/MINERALS CH
1.0000 | ORAL_TABLET | Freq: Every day | ORAL | Status: DC
  Administered 2019-08-19 – 2019-08-21 (×3): 1 via ORAL
  Filled 2019-08-19 (×3): qty 1

## 2019-08-19 MED ORDER — FLUTICASONE-UMECLIDIN-VILANT 100-62.5-25 MCG/INH IN AEPB: 1.0000 | INHALATION_SPRAY | Freq: Every day | RESPIRATORY_TRACT | Status: DC

## 2019-08-19 MED ORDER — POTASSIUM CHLORIDE CRYS ER 20 MEQ PO TBCR
40.0000 meq | EXTENDED_RELEASE_TABLET | Freq: Two times a day (BID) | ORAL | Status: DC
  Administered 2019-08-19 – 2019-08-21 (×5): 40 meq via ORAL
  Filled 2019-08-19 (×5): qty 2

## 2019-08-19 MED ORDER — ALPRAZOLAM 0.5 MG PO TABS
0.5000 mg | ORAL_TABLET | Freq: Two times a day (BID) | ORAL | Status: DC
  Administered 2019-08-19 – 2019-08-21 (×5): 0.5 mg via ORAL
  Filled 2019-08-19 (×5): qty 1

## 2019-08-19 MED ORDER — SODIUM CHLORIDE 0.9 % IV SOLN
INTRAVENOUS | Status: DC
  Administered 2019-08-19: 19:00:00 via INTRAVENOUS

## 2019-08-19 MED ORDER — ORAL CARE MOUTH RINSE
15.0000 mL | Freq: Two times a day (BID) | OROMUCOSAL | Status: DC
  Administered 2019-08-19 – 2019-08-21 (×3): 15 mL via OROMUCOSAL

## 2019-08-19 MED ORDER — ALBUTEROL SULFATE HFA 108 (90 BASE) MCG/ACT IN AERS
2.0000 | INHALATION_SPRAY | Freq: Four times a day (QID) | RESPIRATORY_TRACT | Status: DC | PRN
  Filled 2019-08-19: qty 6.7

## 2019-08-19 MED ORDER — AMIODARONE HCL IN DEXTROSE 360-4.14 MG/200ML-% IV SOLN
60.0000 mg/h | INTRAVENOUS | Status: AC
  Administered 2019-08-19: 03:00:00 60 mg/h via INTRAVENOUS
  Filled 2019-08-19: qty 200

## 2019-08-19 NOTE — ED Notes (Signed)
Pt placed in hospital bed for comfort due to holding in the ED

## 2019-08-19 NOTE — ED Notes (Signed)
Hospitalist made aware of change in blood pressure.

## 2019-08-19 NOTE — Progress Notes (Signed)
[facility] aware that the pt will need to be transported from [facility] to [place] for a TEE/Cardioversion. Pt will be here by 0830 for a 0930 procedure. Jobe Igo, RN

## 2019-08-19 NOTE — ED Notes (Signed)
Hospitalist at bedside 

## 2019-08-19 NOTE — Progress Notes (Signed)
ANTICOAGULATION CONSULT NOTE - Initial Consult  Pharmacy Consult for Eliquis Indication: atrial fibrillation  Allergies  Allergen Reactions  . Morphine And Related Other (See Comments)    hallucinations    Patient Measurements:    Vital Signs: Temp: 97.9 F (36.6 C) (09/17 0700) Temp Source: Oral (09/17 0700) BP: 99/65 (09/17 1245) Pulse Rate: 144 (09/17 1245)  Labs: Recent Labs    08/18/19 2128 08/19/19 0401  HGB 10.4* 9.5*  HCT 33.2* 30.3*  PLT 235 210  CREATININE 0.69 0.53    Estimated Creatinine Clearance: 61.1 mL/min (by C-G formula based on SCr of 0.53 mg/dL).   Medical History: Past Medical History:  Diagnosis Date  . Acute on chronic diastolic (congestive) heart failure ([place]) 06/12/2019  . AKI (acute kidney injury) ([place])   . Anemia   . Anxiety   . Arthritis    "back"  . Atrial flutter ([place]) 03/13/2016  . CAD (coronary artery disease)   . Cancer ([place])    skin cancer  . Cataract   . Colon polyp   . Complication of anesthesia    BP dropped during esophagus diltation in 2017-admitted for 3 days.   Marland Kitchen COPD (chronic obstructive pulmonary disease) ([place])   . Depression   . Dysrhythmia   . Emphysema of lung ([place])   . GERD (gastroesophageal reflux disease)   . Hypercholesterolemia   . Lung cancer ([place]) dx'd 01/2019  . Osteoporosis   . Oxygen deficiency     Medications:  No anticoagulants PTA  Assessment: 68 yo F admit with Afib.  She has history of atrial flutters/p ablation in 2018 and was taken off of Eliquis at that time. Pharmacy consulted for Eliquis dosing.  CBC- Hg low at 9.5, Pltc WNL.  No active bleeding noted.  Patient qualifies for full dose based on current weight, age, Scr.    Plan:  Eliquis 5mg  PO BID Monitor closely for s/sx ob bleeding Provide Eliquis education to patient  Netta Cedars, PharmD, BCPS 08/19/2019,2:11 PM

## 2019-08-19 NOTE — Progress Notes (Signed)
Same day note  Patient seen and examined at bedside.  Patient was admitted to the hospital for shortness of breath was found to be in atrial flutter with rapid ventricular response.  History of diastolic congestive heart failure, small cell lung cancer in remission, COPD on chronic oxygen at home, history of atrial flutter status post ablation in 2011.  Continues to smoke at this time.  At the time of my evaluation, patient complains of shortness of breath.  Denies any chest pain palpitation fever or chills.  Physical examination reveals rhythm in atrial flutter with hypotension.  Patient does have diminished breath sounds with gross peripheral edema.  Laboratory data and imaging was reviewed,  Assessment and Plan.  Atrial flutter with rapid ventricular response with hypotension..  Could not tolerate iv Cardizem due to hypotension.  Admitting provider spoke with cardiology Dr Aundra Dubin and recommended amiodarone infusion.  Depite amiodarone infusion her heart rate is persistently elevated at 681L and systolic blood pressure upper 80s.  I had an extensive discussion with the patient and she would like to proceed with further treatment as necessary.  History of ablation in 2018.  Hold p.o. Cardizem from home at this time.  Spoke with cardiology master regarding consultation for possibility of needing electrophysiology evaluation/ablation/cardioversion.  Will discontinue duo nebs.  Put on inhalers.  Patient is DNR but wishes to proceed with further treatment.  6.7.  Free T4 of 1.16.  TSH high as well. Unsure of significance. Check Free T3.  BNP at 84.  Magnesium of 2.2  Chronic hypoxic respiratory failure secondary to COPD and small cell lung cancer.  Continue home oxygen.  Supportive care.  Patient follows up with Dr. Myles Gip as outpatient.  She was supposed to get an MRI of the brain as outpatient.  Hypokalemia.  Will replenish.  Check electrolytes closely.  Magnesium was 2.2.  Chronic pain syndrome.   Continue Norco.  Leukocytosis likely reactive.  Improved today.  Chronic diastolic congestive failure.  Patient takes her torsemide as outpatient.  Currently on hold due to low blood pressure.  No Charge  Signed,  Delila Pereyra, MD [facility]

## 2019-08-19 NOTE — Consult Note (Addendum)
Cardiology Consultation:   Patient ID: Tonya Bradley MRN: [id number]; DOB: [birth year]  Admit date: 08/18/2019 Date of Consult: 08/19/2019  Primary Care Provider: Martinique, [name], MD Primary Cardiologist: Carlyle Dolly, MD  Primary Electrophysiologist: Dr. Lovena Le    Patient Profile:   Tonya Bradley is a 68 y.o. 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 Louanne Belton, Emergency Medicine.   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  She was recently admitted this past July to [facility] for acute on chronic diastolic CHF. Echo showed normal LVEF 60-65%. No significant valvular disase. She was treated w/ Lasix and volume improved but she still had some dyspnea, felt likely 2/2 to her worsening anemia. She is suspected to have anemia of chronic disease from malignancy. She required blood transfusion. that admission, she also had significant hypotension and required temporarily pressor support but BP later improved. Was discharged home w/ palliative care. Due to borderline hypotension, she was ordered to take her torsemide PRN.   Pt reports recent increased dyspnea over the last several weeks. Palliative care RN did recent home visit and noted HR was in the 160s and she was referred to the ED. EKG confirms rapid typical atrial flutter 120s. SBP soft in the 90s. K 3.3. SCr WNL at 0.53. Hgb 9.5. IV amiodarone started by EDP. She has 3+ bilateral LE pitting edema on  exam. BNP only 84. CXR notes volume loss on the right c/w prior surgical history. No focal infiltrate or sizable effusion noted.      Heart Pathway Score:     Past Medical History:  Diagnosis Date   Acute on chronic diastolic (congestive) heart failure ([place]) 06/12/2019   AKI (acute kidney injury) ([place])    Anemia    Anxiety    Arthritis    "back"   Atrial flutter ([place]) 03/13/2016   CAD (coronary artery disease)    Cancer (HCC)    skin cancer   Cataract    Colon polyp    Complication of anesthesia    BP dropped during esophagus diltation in 2017-admitted for 3 days.    COPD (chronic obstructive pulmonary disease) (HCC)    Depression    Dysrhythmia    Emphysema of lung (HCC)    GERD (gastroesophageal reflux disease)    Hypercholesterolemia    Lung cancer ([place]) dx'd 01/2019   Osteoporosis    Oxygen deficiency     Past Surgical History:  Procedure Laterality Date   A-FLUTTER ABLATION N/A 05/19/2017   Procedure: A-Flutter Ablation;  Surgeon: Evans Lance, MD;  Location: [facility];  Service: Cardiovascular;  Laterality: N/A;   APPENDECTOMY     BASAL CELL CARCINOMA EXCISION     CARDIAC CATHETERIZATION     CATARACT EXTRACTION Bilateral    CHOLECYSTECTOMY     COLONOSCOPY N/A 06/23/2014   Dr. Rourk:Rectal and colonic polyps-removed, Pancolonic diverticulosis. lymphocytic colitis   CORONARY ANGIOPLASTY WITH STENT PLACEMENT     ESOPHAGEAL DILATION N/A 03/13/2016  Procedure: ESOPHAGEAL DILATION;  Surgeon: Daneil Dolin, MD;  [facility];  Service: Endoscopy;  Laterality: N/A;   ESOPHAGOGASTRODUODENOSCOPY N/A 03/13/2016   Procedure: ESOPHAGOGASTRODUODENOSCOPY (EGD);  Surgeon: Daneil Dolin, MD;  [facility];  Service: Endoscopy;  Laterality: N/A;  0930   EYE SURGERY     cataract removed, bilateral   IR THORACENTESIS ASP PLEURAL SPACE W/IMG GUIDE  02/15/2019   LUMBAR LAMINECTOMY/DECOMPRESSION MICRODISCECTOMY N/A  01/19/2018   Procedure: L4-5 DECOMPRESSION;  Surgeon: Marybelle Killings, MD;  Location: [facility];  Service: Orthopedics;  Laterality: N/A;   VIDEO ASSISTED THORACOSCOPY (VATS)/ LOBECTOMY Right 02/04/2019   Procedure: VIDEO ASSISTED THORACOSCOPY (VATS)/RIGHT UPPER AND MIDDLE LOBECTOMY;  Surgeon: Melrose Nakayama, MD;  Location: [facility];  Service: Thoracic;  Laterality: Right;   VIDEO BRONCHOSCOPY N/A 02/14/2019   Procedure: BRONCHOSCOPY WITH SEDATION;  Surgeon: Melrose Nakayama, MD;  Location: [facility];  Service: Thoracic;  Laterality: N/A;     Home Medications:  Prior to Admission medications   Medication Sig Start Date End Date Taking? Authorizing Provider  ALPRAZolam Duanne Moron) 0.5 MG tablet Take 1 tablet (0.5 mg total) by mouth 2 (two) times daily. 07/09/19  Yes Martinique, [name], MD  atorvastatin (LIPITOR) 80 MG tablet TAKE 1 TABLET(80 MG) BY MOUTH DAILY Patient taking differently: Take 80 mg by mouth daily.  04/28/19  Yes Martinique, [name], MD  Cholecalciferol (VITAMIN D) 2000 units CAPS Take 2,000 Units by mouth daily.   Yes [provider]  cycloSPORINE (RESTASIS) 0.05 % ophthalmic emulsion Place 1 drop into both eyes 2 (two) times daily.   Yes [provider]  dexamethasone (DECADRON) 4 MG tablet Take 2 mg by mouth daily.  07/19/19  Yes [provider]  diltiazem (CARDIZEM SR) 60 MG 12 hr capsule Take 1 capsule (60 mg total) by mouth 2 (two) times daily. 08/13/19  Yes Martinique, [name], MD  Fluticasone-Umeclidin-Vilant (TRELEGY ELLIPTA) 100-62.5-25 MCG/INH AEPB Inhale 1 puff into the lungs daily. 06/29/19  Yes Martinique, [name], MD  HYDROcodone-acetaminophen (NORCO/VICODIN) 5-325 MG tablet Take 1 tablet by mouth every 6 (six) hours as needed for moderate pain.   Yes [provider]  ipratropium-albuterol (DUONEB) 0.5-2.5 (3) MG/3ML SOLN Take 3 mLs by nebulization every 4 (four) hours as needed (shortness of breath or wheezing). 06/19/19  Yes Donne Hazel, MD    MAGNESIUM-OXIDE 400 (241.3 Mg) MG tablet TAKE 1 TABLET BY MOUTH TWICE DAILY Patient taking differently: Take 400 mg by mouth 2 (two) times daily.  07/30/19  Yes Curt Bears, MD  Multiple Vitamin (MULTIVITAMIN WITH MINERALS) TABS tablet Take 1 tablet by mouth daily.   Yes [provider]  prochlorperazine (COMPAZINE) 10 MG tablet TAKE 1 TABLET(10 MG) BY MOUTH EVERY 6 HOURS AS NEEDED FOR NAUSEA OR VOMITING Patient taking differently: Take 10 mg by mouth every 6 (six) hours as needed for nausea or vomiting.  08/16/19  Yes Curt Bears, MD  QUEtiapine (SEROQUEL) 100 MG tablet TAKE 1 AND 1/2 TABLETS(150 MG) BY MOUTH AT BEDTIME Patient taking differently: Take 150 mg by mouth at bedtime.  08/17/19  Yes Martinique, [name], MD  rOPINIRole (REQUIP) 0.25 MG tablet Take 2 tablets (0.5 mg total) by mouth at bedtime. 06/28/19  Yes Martinique, [name], MD  torsemide (DEMADEX) 20 MG tablet Take 1 tablet (20 mg total) by mouth as needed. Patient taking differently: Take 20 mg by mouth as needed (fluid retention).  07/01/19  Yes Consuelo Pandy,  PA-C  VENTOLIN HFA 108 (90 Base) MCG/ACT inhaler INHALE 2 PUFFS INTO THE LUNGS EVERY 6 HOURS AS NEEDED FOR WHEEZING OR SHORTNESS OF BREATH Patient taking differently: Inhale 2 puffs into the lungs every 6 (six) hours as needed for wheezing or shortness of breath.  09/19/17  Yes Martinique, [name], MD    Inpatient Medications: Scheduled Meds:  ALPRAZolam  0.5 mg Oral BID   atorvastatin  80 mg Oral q1800   cholecalciferol  2,000 Units Oral Daily   cycloSPORINE  1 drop Both Eyes BID   dexamethasone  2 mg Oral Daily   enoxaparin (LOVENOX) injection  40 mg Subcutaneous Daily   fluticasone furoate-vilanterol  1 puff Inhalation Daily   And   umeclidinium bromide  1 puff Inhalation Daily   magnesium oxide  400 mg Oral BID   multivitamin with minerals  1 tablet Oral Daily   potassium chloride  40 mEq Oral BID   QUEtiapine  150 mg Oral QHS    rOPINIRole  0.5 mg Oral QHS   Continuous Infusions:  amiodarone 30 mg/hr (08/19/19 0725)   PRN Meds: acetaminophen, albuterol, HYDROcodone-acetaminophen, ondansetron (ZOFRAN) IV, prochlorperazine  Allergies:    Allergies  Allergen Reactions   Morphine And Related Other (See Comments)    hallucinations    Social History:   Social History   Socioeconomic History   Marital status: Married    Spouse name: Not on file   Number of children: 3   Years of education: 10   Highest education level: Not on file  Occupational History   Occupation: retired    Comment: customer service [employer] resource strain: Not on file   Food insecurity    Worry: Not on file    Inability: Not on file   Transportation needs    Medical: Not on file    Non-medical: Not on file  Tobacco Use   Smoking status: Current Every Day Smoker    Packs/day: 0.25    Years: 54.00    Pack years: 13.50    Types: Cigarettes    Start date: 06/14/1966   Smokeless tobacco: Never Used   Tobacco comment: 1-2 packs daily. Chantix too expensive; pt states that she quit 2 weeks ago (02/04/19)   Substance and Sexual Activity   Alcohol use: No    Alcohol/week: 0.0 standard drinks   Drug use: No   Sexual activity: Yes    Birth control/protection: Post-menopausal  Lifestyle   Physical activity    Days per week: Not on file    Minutes per session: Not on file   Stress: Not on file  Relationships   Social connections    Talks on phone: Not on file    Gets together: Not on file    Attends religious service: Not on file    Active member of club or organization: Not on file    Attends meetings of clubs or organizations: Not on file    Relationship status: Not on file   Intimate partner violence    Fear of current or ex partner: Not on file    Emotionally abused: Not on file    Physically abused: Not on file    Forced sexual activity: Not on file  Other Topics Concern    Not on file  Social History Narrative   Quit high school and got married in 10th grade, age 35   First husband died of colon cancer at young age  Married again 2018   Lives in own home   Daughter lives with her   Drinks coffee seldom, too much "sweet tea"    Family History:    Family History  Problem Relation Age of Onset   Breast cancer Sister 74   Heart disease Sister    Heart attack Father    Heart disease Father    Stroke Mother    Heart disease Mother    Hyperlipidemia Mother    Heart attack Sister    Heart disease Brother        ?heart failure   Congestive Heart Failure Brother    Hypertension Son    Heart disease Maternal Grandfather    Cancer Cousin        lymphoma   Hypertension Son    Stomach cancer Other    Colon cancer Neg Hx    Pancreatic cancer Neg Hx      ROS:  Please see the history of present illness.    All other ROS reviewed and negative.     Physical Exam/Data:   Vitals:   08/19/19 1200 08/19/19 1215 08/19/19 1230 08/19/19 1245  BP: (!) 85/63 (!) 88/61 102/71 99/65  Pulse:  (!) 141 (!) 128 (!) 144  Resp: (!) 22 (!) 28 (!) 21 (!) 25  Temp:      TempSrc:      SpO2:  97% 95% 96%    Intake/Output Summary (Last 24 hours) at 08/19/2019 1305 Last data filed at 08/19/2019 0726 Gross per 24 hour  Intake 243.04 ml  Output --  Net 243.04 ml   Last 3 Weights 08/11/2019 08/02/2019 07/03/2019  Weight (lbs) 155 lb 6 oz 145 lb 14.4 oz 139 lb 1.6 oz  Weight (kg) 70.478 kg 66.18 kg 63.095 kg     There is no height or weight on file to calculate BMI.  General:  Well nourished, well developed, in no acute distress HEENT: normal Lymph: no adenopathy Neck: no JVD Endocrine:  No thryomegaly Vascular: No carotid bruits; FA pulses 2+ bilaterally without bruits  Cardiac:  irregular rhythm, tachy rate Lungs:  clear to auscultation bilaterally, no wheezing, rhonchi or rales  Abd: soft, nontender, no hepatomegaly  Ext: 3+ bilateral  LEE Musculoskeletal:  No deformities, BUE and BLE strength normal and equal Skin: warm and dry  Neuro:  CNs 2-12 intact, no focal abnormalities noted Psych:  Normal affect   EKG:  The EKG was personally reviewed and demonstrates:  Typical atrial flutter w/ RVR 123  bpm Telemetry:  Telemetry was personally reviewed and demonstrates:  Atrial flutter w/ RVR Relevant CV Studies: 2D echo 06/13/19 IMPRESSIONS    1. The left ventricle has normal systolic function with an ejection fraction of 60-65%. The cavity size was normal. Indeterminate diastolic filling due to E-A fusion.  2. The right ventricle has normal systolic function. The cavity was normal. There is no increase in right ventricular wall thickness. Right ventricular systolic pressure could not be assessed.  3. Left atrial size was mildly dilated.  4. The aortic valve is abnormal. Moderate calcification of the aortic valve. No stenosis of the aortic valve.  5. The inferior vena cava was dilated in size with <50% respiratory variability.  Laboratory Data:  High Sensitivity Troponin:  No results for input(s): TROPONINIHS in the last 720 hours.   Chemistry Recent Labs  Lab 08/18/19 2128 08/19/19 0401  NA 137 138  K 3.5 3.3*  CL 92* 97*  CO2 31 32  GLUCOSE  113* 126*  BUN 30* 24*  CREATININE 0.69 0.53  CALCIUM 9.6 8.7*  GFRNONAA >60 >60  GFRAA >60 >60  ANIONGAP 14 9    No results for input(s): PROT, ALBUMIN, AST, ALT, ALKPHOS, BILITOT in the last 168 hours. Hematology Recent Labs  Lab 08/18/19 2128 08/19/19 0401  WBC 11.0* 9.7  RBC 3.22* 2.89*  HGB 10.4* 9.5*  HCT 33.2* 30.3*  MCV 103.1* 104.8*  MCH 32.3 32.9  MCHC 31.3 31.4  RDW 16.1* 16.2*  PLT 235 210   BNP Recent Labs  Lab 08/18/19 2130  BNP 84.5    DDimer No results for input(s): DDIMER in the last 168 hours.   Radiology/Studies:  Dg Chest Portable 1 View  Result Date: 08/18/2019 CLINICAL DATA:  Shortness of breath attack EXAM: PORTABLE CHEST 1  VIEW COMPARISON:  07/26/2019 07/03/2019 FINDINGS: Cardiac shadow is within normal limits. Aortic calcifications are seen. Volume loss on the right is noted consistent with the prior surgical history. No focal infiltrate or sizable effusion is noted. No bony abnormality is seen. IMPRESSION: Volume loss on the right consistent with prior lobectomy. No acute abnormality noted. Electronically Signed   By: Inez Catalina M.D.   On: 08/18/2019 21:44    Assessment and Plan:   1. Typical Atrial Flutter w/ RVR: recurrence. S/p aflutter ablation in 2018. In the setting of lung CA, s/p right sided VATS w/ lobectomy and COPD. EDP has started IV amiodarone. Agree w/ amiodarone for rate control. BP too soft to consider addition of Cardizem. We will plan to start a/c w/ Eliquis and will plan TEE guided DCCV vs repeat aflutter ablation prior to d/c w/ short term a/c. Will discuss rhythm control strategy w/ Dr. Lovena Le. NPO at midnight. Also needs K supplementation for hypokalemia (3.4). K dur 40 mEq bid ordered. Mg WNL at 2.2.   2. Acute on Chronic Diastolic CHF: 3+ bilateral LE pitting edema noted on exam. In the setting of rapid atrial flutter w/ RVR. CXR w/o edema. Will give dose of IV Lasix now, 40 mg once. Monitor BP closely. Will order strict I/Os and daily weights.     For questions or updates, please contact [place] Please consult [email or web address] for contact info under     Signed, Lyda Jester, PA-C  08/19/2019 1:05 PM

## 2019-08-19 NOTE — H&P (Signed)
History and Physical    Tonya Bradley QQP:[id number] DOB: 1951-03-22 DOA: 08/18/2019  PCP: Martinique, [name], MD  Patient coming from: Home  I have personally briefly reviewed patient's old medical records in [place]  Chief Complaint: SOB  HPI: Tonya Bradley is a 68 y.o. female with medical history significant of diastolic CHF, SCLC currently in remission, COPD on chronic O2, paroxysmal A.Flutter s/p ablation in 2018.  Pt states she has been short of breath for a while now.  Yesterday her palliative care nurse saw her and told her, her heart rate was in the 160s.  The nurse wanted her to come but she did not want to.  Today she felt worse.  Activity makes it worse.  Continues to smoke.  No fever nor chills.   ED Course: Patient with A.Flutter 2:1 HR 150s.  This confirmed when EDP attempted to treat with adenosine, revealing the obvious flutter waves.  EDP put on cardizem gtt, HR remained uncontrolled (mostly 2:1 still) but BP dropped into the 71I systolic.  Cardizem gtt stopped and BP back up to 97 systolic (looks like her baseline is upper 90s to low 458K systolic based on recent office visits and admissions).  No active CA on CT as of 8/24.  Labs otherwise notable for mild stable anemia.   Review of Systems: As per HPI, otherwise all review of systems negative.  Past Medical History:  Diagnosis Date   Acute on chronic diastolic (congestive) heart failure ([place]) 06/12/2019   AKI (acute kidney injury) ([place])    Anemia    Anxiety    Arthritis    "back"   Atrial flutter ([place]) 03/13/2016   CAD (coronary artery disease)    Cancer (HCC)    skin cancer   Cataract    Colon polyp    Complication of anesthesia    BP dropped during esophagus diltation in 2017-admitted for 3 days.    COPD (chronic obstructive pulmonary disease) (HCC)    Depression    Dysrhythmia    Emphysema of lung (HCC)    GERD (gastroesophageal reflux disease)     Hypercholesterolemia    Lung cancer ([place] [place]) dx'd 01/2019   Osteoporosis    Oxygen deficiency     Past Surgical History:  Procedure Laterality Date   A-FLUTTER ABLATION N/A 05/19/2017   Procedure: A-Flutter Ablation;  Surgeon: Evans Lance, MD;  Location: [facility];  Service: Cardiovascular;  Laterality: N/A;   APPENDECTOMY     BASAL CELL CARCINOMA EXCISION     CARDIAC CATHETERIZATION     CATARACT EXTRACTION Bilateral    CHOLECYSTECTOMY     COLONOSCOPY N/A 06/23/2014   Dr. Rourk:Rectal and colonic polyps-removed, Pancolonic diverticulosis. lymphocytic colitis   CORONARY ANGIOPLASTY WITH STENT PLACEMENT     ESOPHAGEAL DILATION N/A 03/13/2016   Procedure: ESOPHAGEAL DILATION;  Surgeon: Daneil Dolin, MD;  [facility];  Service: Endoscopy;  Laterality: N/A;   ESOPHAGOGASTRODUODENOSCOPY N/A 03/13/2016   Procedure: ESOPHAGOGASTRODUODENOSCOPY (EGD);  Surgeon: Daneil Dolin, MD;  [facility];  Service: Endoscopy;  Laterality: N/A;  0930   EYE SURGERY     cataract removed, bilateral   IR THORACENTESIS ASP PLEURAL SPACE W/IMG GUIDE  02/15/2019   LUMBAR LAMINECTOMY/DECOMPRESSION MICRODISCECTOMY N/A 01/19/2018   Procedure: L4-5 DECOMPRESSION;  Surgeon: Marybelle Killings, MD;  Location: [facility];  Service: Orthopedics;  Laterality: N/A;   VIDEO ASSISTED THORACOSCOPY (VATS)/ LOBECTOMY Right 02/04/2019   Procedure: VIDEO ASSISTED THORACOSCOPY (  VATS)/RIGHT UPPER AND MIDDLE LOBECTOMY;  Surgeon: Melrose Nakayama, MD;  Location: [facility];  Service: Thoracic;  Laterality: Right;   VIDEO BRONCHOSCOPY N/A 02/14/2019   Procedure: BRONCHOSCOPY WITH SEDATION;  Surgeon: Melrose Nakayama, MD;  Location: [facility];  Service: Thoracic;  Laterality: N/A;     reports that she has been smoking cigarettes. She started smoking about 53 years ago. She has a 13.50 pack-year smoking history. She has never used smokeless tobacco. She reports that she does not drink alcohol or use  drugs.  Allergies  Allergen Reactions   Morphine And Related Other (See Comments)    hallucinations    Family History  Problem Relation Age of Onset   Breast cancer Sister 39   Heart disease Sister    Heart attack Father    Heart disease Father    Stroke Mother    Heart disease Mother    Hyperlipidemia Mother    Heart attack Sister    Heart disease Brother        ?heart failure   Congestive Heart Failure Brother    Hypertension Son    Heart disease Maternal Grandfather    Cancer Cousin        lymphoma   Hypertension Son    Stomach cancer Other    Colon cancer Neg Hx    Pancreatic cancer Neg Hx      Prior to Admission medications   Medication Sig Start Date End Date Taking? Authorizing Provider  ALPRAZolam Duanne Moron) 0.5 MG tablet Take 1 tablet (0.5 mg total) by mouth 2 (two) times daily. 07/09/19  Yes Martinique, [name], MD  atorvastatin (LIPITOR) 80 MG tablet TAKE 1 TABLET(80 MG) BY MOUTH DAILY Patient taking differently: Take 80 mg by mouth daily.  04/28/19  Yes Martinique, [name], MD  Cholecalciferol (VITAMIN D) 2000 units CAPS Take 2,000 Units by mouth daily.   Yes [provider]  cycloSPORINE (RESTASIS) 0.05 % ophthalmic emulsion Place 1 drop into both eyes 2 (two) times daily.   Yes [provider]  dexamethasone (DECADRON) 4 MG tablet Take 2 mg by mouth daily.  07/19/19  Yes [provider]  diltiazem (CARDIZEM SR) 60 MG 12 hr capsule Take 1 capsule (60 mg total) by mouth 2 (two) times daily. 08/13/19  Yes Martinique, [name], MD  Fluticasone-Umeclidin-Vilant (TRELEGY ELLIPTA) 100-62.5-25 MCG/INH AEPB Inhale 1 puff into the lungs daily. 06/29/19  Yes Martinique, [name], MD  HYDROcodone-acetaminophen (NORCO/VICODIN) 5-325 MG tablet Take 1 tablet by mouth every 6 (six) hours as needed for moderate pain.   Yes [provider]  ipratropium-albuterol (DUONEB) 0.5-2.5 (3) MG/3ML SOLN Take 3 mLs by nebulization every 4 (four) hours as needed  (shortness of breath or wheezing). 06/19/19  Yes Donne Hazel, MD  MAGNESIUM-OXIDE 400 (241.3 Mg) MG tablet TAKE 1 TABLET BY MOUTH TWICE DAILY Patient taking differently: Take 400 mg by mouth 2 (two) times daily.  07/30/19  Yes Curt Bears, MD  Multiple Vitamin (MULTIVITAMIN WITH MINERALS) TABS tablet Take 1 tablet by mouth daily.   Yes [provider]  prochlorperazine (COMPAZINE) 10 MG tablet TAKE 1 TABLET(10 MG) BY MOUTH EVERY 6 HOURS AS NEEDED FOR NAUSEA OR VOMITING Patient taking differently: Take 10 mg by mouth every 6 (six) hours as needed for nausea or vomiting.  08/16/19  Yes Curt Bears, MD  QUEtiapine (SEROQUEL) 100 MG tablet TAKE 1 AND 1/2 TABLETS(150 MG) BY MOUTH AT BEDTIME Patient taking differently: Take 150 mg by mouth  at bedtime.  08/17/19  Yes Martinique, [name], MD  rOPINIRole (REQUIP) 0.25 MG tablet Take 2 tablets (0.5 mg total) by mouth at bedtime. 06/28/19  Yes Martinique, [name], MD  torsemide (DEMADEX) 20 MG tablet Take 1 tablet (20 mg total) by mouth as needed. Patient taking differently: Take 20 mg by mouth as needed (fluid retention).  07/01/19  Yes Simmons, Brittainy M, PA-C  VENTOLIN HFA 108 (90 Base) MCG/ACT inhaler INHALE 2 PUFFS INTO THE LUNGS EVERY 6 HOURS AS NEEDED FOR WHEEZING OR SHORTNESS OF BREATH Patient taking differently: Inhale 2 puffs into the lungs every 6 (six) hours as needed for wheezing or shortness of breath.  09/19/17  Yes Martinique, [name], MD    Physical Exam: Vitals:   08/19/19 0030 08/19/19 0045 08/19/19 0100 08/19/19 0115  BP: (!) 80/61 (!) 89/70 97/73 94/64   Pulse: (!) 33 (!) 153 (!) 110 (!) 133  Resp: (!) 23 (!) 26 (!) 25 (!) 27  Temp:      TempSrc:      SpO2: 97% 97% 94% 94%    Constitutional: NAD, calm, comfortable Eyes: PERRL, lids and conjunctivae normal ENMT: Mucous membranes are moist. Posterior pharynx clear of any exudate or lesions.Normal dentition.  Neck: normal, supple, no masses, no thyromegaly Respiratory:  clear to auscultation bilaterally, no wheezing, no crackles. Normal respiratory effort. No accessory muscle use.  Cardiovascular: Tachycardic, irr, irr Abdomen: no tenderness, no masses palpated. No hepatosplenomegaly. Bowel sounds positive.  Musculoskeletal: no clubbing / cyanosis. No joint deformity upper and lower extremities. Good ROM, no contractures. Normal muscle tone.  Skin: no rashes, lesions, ulcers. No induration Neurologic: CN 2-12 grossly intact. Sensation intact, DTR normal. Strength 5/5 in all 4.  Psychiatric: Normal judgment and insight. Alert and oriented x 3. Normal mood.    Labs on Admission: I have personally reviewed following labs and imaging studies  CBC: Recent Labs  Lab 08/18/19 2128  WBC 11.0*  NEUTROABS 6.6  HGB 10.4*  HCT 33.2*  MCV 103.1*  PLT 469   Basic Metabolic Panel: Recent Labs  Lab 08/18/19 2128  NA 137  K 3.5  CL 92*  CO2 31  GLUCOSE 113*  BUN 30*  CREATININE 0.69  CALCIUM 9.6  MG 2.2   GFR: Estimated Creatinine Clearance: 61.1 mL/min (by C-G formula based on SCr of 0.69 mg/dL). Liver Function Tests: No results for input(s): AST, ALT, ALKPHOS, BILITOT, PROT, ALBUMIN in the last 168 hours. No results for input(s): LIPASE, AMYLASE in the last 168 hours. No results for input(s): AMMONIA in the last 168 hours. Coagulation Profile: No results for input(s): INR, PROTIME in the last 168 hours. Cardiac Enzymes: No results for input(s): CKTOTAL, CKMB, CKMBINDEX, TROPONINI in the last 168 hours. BNP (last 3 results) No results for input(s): PROBNP in the last 8760 hours. HbA1C: No results for input(s): HGBA1C in the last 72 hours. CBG: No results for input(s): GLUCAP in the last 168 hours. Lipid Profile: No results for input(s): CHOL, HDL, LDLCALC, TRIG, CHOLHDL, LDLDIRECT in the last 72 hours. Thyroid Function Tests: No results for input(s): TSH, T4TOTAL, FREET4, T3FREE, THYROIDAB in the last 72 hours. Anemia Panel: No results for  input(s): VITAMINB12, FOLATE, FERRITIN, TIBC, IRON, RETICCTPCT in the last 72 hours. Urine analysis:    Component Value Date/Time   COLORURINE YELLOW 07/03/2019 1727   APPEARANCEUR CLEAR 07/03/2019 1727   LABSPEC 1.012 07/03/2019 1727   PHURINE 5.0 07/03/2019 1727   GLUCOSEU NEGATIVE 07/03/2019 1727   GLUCOSEU  NEGATIVE 08/04/2018 1721   HGBUR NEGATIVE 07/03/2019 [place] 07/03/2019 1727   [place] 07/03/2019 1727   PROTEINUR NEGATIVE 07/03/2019 1727   UROBILINOGEN 0.2 08/04/2018 1721   NITRITE NEGATIVE 07/03/2019 1727   LEUKOCYTESUR NEGATIVE 07/03/2019 1727    Radiological Exams on Admission: Dg Chest Portable 1 View  Result Date: 08/18/2019 CLINICAL DATA:  Shortness of breath attack EXAM: PORTABLE CHEST 1 VIEW COMPARISON:  07/26/2019 07/03/2019 FINDINGS: Cardiac shadow is within normal limits. Aortic calcifications are seen. Volume loss on the right is noted consistent with the prior surgical history. No focal infiltrate or sizable effusion is noted. No bony abnormality is seen. IMPRESSION: Volume loss on the right consistent with prior lobectomy. No acute abnormality noted. Electronically Signed   By: Inez Catalina M.D.   On: 08/18/2019 21:44    EKG: Independently reviewed.  Assessment/Plan Principal Problem:   Atrial flutter with rapid ventricular response (HCC) Active Problems:   Chronic hypoxemic respiratory failure (HCC)   Anxiolytic dependence (HCC)   Chronic pain disorder   COPD (chronic obstructive pulmonary disease) (HCC)   Small cell lung cancer, right (HCC)   Chronic diastolic CHF (congestive heart failure) ([place])    1. Aflutter RVR - 1. Stopped cardizem since this was just reducing BP without reducing HR 2. Spoke with Dr. Aundra Dubin who recommended amiodarone 3. Will therefore try amiodarone load and infusion since it also looks like amiodarone has worked for her in the past for the most part ever since the ablation in 2018.  (at least up  until the July admission for CHF). 4. Also looked like they had planned to restart this according to cards note (Dr. Vickki Muff 06/17/2019) and Dr. Julianne Rice progress note later that day during last admit.  Though it apparently was stopped on discharge for some reason.  Dont see any mention in the DC summary from that admit that there was significant suspicion of amiodarone pulmonary toxicity (or other toxicity). 5. A.Fib pathway 6. Tele monitor 7. Holding home PO cardizem as well 8. She isnt on chronic anticoagulation (and hasnt been), not entirely clear why (maybe due to hospice status for her SCLC and COPD?) but will leave her off for the moment and defer this to cards. 2. Chronic hypoxic resp failure - 1. Continue home O2 3. COPD - 1. Continue home nebs and O2 4. SCLC - 1. Continue decadron 2. Completed surgery, had 3 cycles of chemo before this was stopped due to toxicity. 3. Currently in remission with observation 4. CT scan a couple of weeks ago showed no evidence of recurrence 5. Chronic pain disorder - continue norco 6. Chronic anxiety - continue xanax 7. Chronic diastolic CHF - 1. No evidence of decompensation today 2. Takes torsemide PRN  DVT prophylaxis: Lovenox Code Status: Full Family Communication: No family in room Disposition Plan: Home after admit Consults called: Curb sided Dr. Aundra Dubin over phone Admission status: Admit to inpatient  Severity of Illness: The appropriate patient status for this patient is INPATIENT. Inpatient status is judged to be reasonable and necessary in order to provide the required intensity of service to ensure the patient's safety. The patient's presenting symptoms, physical exam findings, and initial radiographic and laboratory data in the context of their chronic comorbidities is felt to place them at high risk for further clinical deterioration. Furthermore, it is not anticipated that the patient will be medically stable for discharge from the hospital  within 2 midnights of admission. The following factors support the  patient status of inpatient.   IP status for A.Flutter RVR needing amiodarone load and infusion.  Associated hypotension with cardizem gtt.   * I certify that at the point of admission it is my clinical judgment that the patient will require inpatient hospital care spanning beyond 2 midnights from the point of admission due to high intensity of service, high risk for further deterioration and high frequency of surveillance required.*    Star Cheese M. DO [facility]  How to contact the [facility] Attending or Consulting provider [place] or covering provider during after hours [place], for this patient?  1. Check the care team in [facility] and look for a) attending/consulting [facility] provider listed and b) the [facility] team listed 2. Log into [email or web address]  Amion Physician Scheduling and messaging for groups and whole hospitals  On call and physician scheduling software for group practices, residents, hospitalists and other medical providers for call, clinic, rotation and shift schedules. OnCall [employer] is a hospital-wide system for scheduling doctors and paging doctors on call. EasyPlot is for scientific plotting and data analysis.  [email or web address]  and use [place]'s universal password to access. If you do not have the password, please contact the hospital operator.  3. Locate the [facility] provider you are looking for under [facility] and page to a number that you can be directly reached. 4. If you still have difficulty reaching the provider, please page the [facility] (Director on Call) for the Hospitalists listed on amion for assistance.  08/19/2019, 1:30 AM

## 2019-08-19 NOTE — H&P (View-Only) (Signed)
Same day note  Patient seen and examined at bedside.  Patient was admitted to the hospital for shortness of breath was found to be in atrial flutter with rapid ventricular response.  History of diastolic congestive heart failure, small cell lung cancer in remission, COPD on chronic oxygen at home, history of atrial flutter status post ablation in 2011.  Continues to smoke at this time.  At the time of my evaluation, patient complains of shortness of breath.  Denies any chest pain palpitation fever or chills.  Physical examination reveals rhythm in atrial flutter with hypotension.  Patient does have diminished breath sounds with gross peripheral edema.  Laboratory data and imaging was reviewed,  Assessment and Plan.  Atrial flutter with rapid ventricular response with hypotension..  Could not tolerate iv Cardizem due to hypotension.  Admitting provider spoke with cardiology Dr Aundra Dubin and recommended amiodarone infusion.  Depite amiodarone infusion her heart rate is persistently elevated at 048G and systolic blood pressure upper 80s.  I had an extensive discussion with the patient and she would like to proceed with further treatment as necessary.  History of ablation in 2018.  Hold p.o. Cardizem from home at this time.  Spoke with cardiology master regarding consultation for possibility of needing electrophysiology evaluation/ablation/cardioversion.  Will discontinue duo nebs.  Put on inhalers.  Patient is DNR but wishes to proceed with further treatment.  6.7.  Free T4 of 1.16.  TSH high as well. Unsure of significance. Check Free T3.  BNP at 84.  Magnesium of 2.2  Chronic hypoxic respiratory failure secondary to COPD and small cell lung cancer.  Continue home oxygen.  Supportive care.  Patient follows up with Dr. Myles Gip as outpatient.  She was supposed to get an MRI of the brain as outpatient.  Hypokalemia.  Will replenish.  Check electrolytes closely.  Magnesium was 2.2.  Chronic pain syndrome.   Continue Norco.  Leukocytosis likely reactive.  Improved today.  Chronic diastolic congestive failure.  Patient takes her torsemide as outpatient.  Currently on hold due to low blood pressure.  No Charge  Signed,  Delila Pereyra, MD [facility]

## 2019-08-19 NOTE — Progress Notes (Signed)
  Amiodarone Drug - Drug Interaction Consult Note  Recommendations: Monitor K and Mag K=3.5 Mag = 2.2, she is on magnesium supplement from home.  Amiodarone is metabolized by the cytochrome P450 system and therefore has the potential to cause many drug interactions. Amiodarone has an average plasma half-life of 50 days (range 20 to 100 days).   There is potential for drug interactions to occur several weeks or months after stopping treatment and the onset of drug interactions may be slow after initiating amiodarone.   [x]  Statins: Increased risk of myopathy. Simvastatin- restrict dose to 20mg  daily. Other statins: counsel patients to report any muscle pain or weakness immediately.  []  Anticoagulants: Amiodarone can increase anticoagulant effect. Consider warfarin dose reduction. Patients should be monitored closely and the dose of anticoagulant altered accordingly, remembering that amiodarone levels take several weeks to stabilize.  []  Antiepileptics: Amiodarone can increase plasma concentration of phenytoin, the dose should be reduced. Note that small changes in phenytoin dose can result in large changes in levels. Monitor patient and counsel on signs of toxicity.  []  Beta blockers: increased risk of bradycardia, AV block and myocardial depression. Sotalol - avoid concomitant use.  []   Calcium channel blockers (diltiazem and verapamil): increased risk of bradycardia, AV block and myocardial depression.  []   Cyclosporine: Amiodarone increases levels of cyclosporine. Reduced dose of cyclosporine is recommended.  []  Digoxin dose should be halved when amiodarone is started.  []  Diuretics: increased risk of cardiotoxicity if hypokalemia occurs.  []  Oral hypoglycemic agents (glyburide, glipizide, glimepiride): increased risk of hypoglycemia. Patient's glucose levels should be monitored closely when initiating amiodarone therapy.   [x]  Drugs that prolong the QT interval:  Torsades de pointes  risk may be increased with concurrent use - avoid if possible.  Monitor QTc, also keep magnesium/potassium WNL if concurrent therapy can't be avoided. Marland Kitchen Antibiotics: e.g. fluoroquinolones, erythromycin. . Antiarrhythmics: e.g. quinidine, procainamide, disopyramide, sotalol. . Antipsychotics: e.g. phenothiazines, haloperidol.  . Lithium, tricyclic antidepressants, and methadone.  She is on Seroquel and Lipitor. Her QTc = 487 on admission. Monitor K and Mag.  Thank You,  Lawana Pai R  08/19/2019 1:28 AM

## 2019-08-19 NOTE — Telephone Encounter (Signed)
LMVM for the patient to contact the office to make a 4 weeks follow up for pain, BP and tachycardia. Can be virtual

## 2019-08-20 ENCOUNTER — Inpatient Hospital Stay (HOSPITAL_COMMUNITY): Payer: Medicare HMO | Admitting: Anesthesiology

## 2019-08-20 ENCOUNTER — Inpatient Hospital Stay (HOSPITAL_COMMUNITY): Payer: Medicare HMO

## 2019-08-20 ENCOUNTER — Encounter (HOSPITAL_COMMUNITY)
Admission: EM | Disposition: A | Discharge status: Home / Self Care | Payer: Self-pay | Source: Home / Self Care | Attending: Internal Medicine

## 2019-08-20 ENCOUNTER — Encounter (HOSPITAL_COMMUNITY): Payer: Self-pay | Admitting: *Deleted

## 2019-08-20 DIAGNOSIS — I4892 Unspecified atrial flutter: Secondary | ICD-10-CM

## 2019-08-20 DIAGNOSIS — Z0189 Encounter for other specified special examinations: Secondary | ICD-10-CM

## 2019-08-20 DIAGNOSIS — R600 Localized edema: Secondary | ICD-10-CM

## 2019-08-20 HISTORY — PX: CARDIOVERSION: SHX1299

## 2019-08-20 HISTORY — PX: TEE WITHOUT CARDIOVERSION: SHX5443

## 2019-08-20 LAB — BASIC METABOLIC PANEL
Anion gap: 10 (ref 5–15)
BUN: 21 mg/dL (ref 8–23)
CO2: 32 mmol/L (ref 22–32)
Calcium: 8.8 mg/dL — ABNORMAL LOW (ref 8.9–10.3)
Chloride: 97 mmol/L — ABNORMAL LOW (ref 98–111)
Creatinine, Ser: 0.62 mg/dL (ref 0.44–1.00)
GFR calc Af Amer: >60 mL/min (ref >60)
GFR calc non Af Amer: >60 mL/min (ref >60)
Glucose, Bld: 165 mg/dL — ABNORMAL HIGH (ref 70–99)
Potassium: 4 mmol/L (ref 3.5–5.1)
Sodium: 139 mmol/L (ref 135–145)

## 2019-08-20 LAB — CBC
HCT: 30.3 % — ABNORMAL LOW (ref 36.0–46.0)
Hemoglobin: 9.5 g/dL — ABNORMAL LOW (ref 12.0–15.0)
MCH: 32.8 pg (ref 26.0–34.0)
MCHC: 31.4 g/dL (ref 30.0–36.0)
MCV: 104.5 fL — ABNORMAL HIGH (ref 80.0–100.0)
Platelets: 207 10*3/uL (ref 150–400)
RBC: 2.9 MIL/uL — ABNORMAL LOW (ref 3.87–5.11)
RDW: 15.9 % — ABNORMAL HIGH (ref 11.5–15.5)
WBC: 8.6 10*3/uL (ref 4.0–10.5)
nRBC: 0 % (ref 0.0–0.2)

## 2019-08-20 LAB — T3, FREE: T3, Free: 2.4 pg/mL (ref 2.0–4.4)

## 2019-08-20 LAB — PHOSPHORUS: Phosphorus: 3.1 mg/dL (ref 2.5–4.6)

## 2019-08-20 LAB — MAGNESIUM: Magnesium: 2.1 mg/dL (ref 1.7–2.4)

## 2019-08-20 LAB — PROTIME-INR
INR: 1.1 (ref 0.8–1.2)
Prothrombin Time: 14.1 seconds (ref 11.4–15.2)

## 2019-08-20 SURGERY — ECHOCARDIOGRAM, TRANSESOPHAGEAL
Anesthesia: Monitor Anesthesia Care

## 2019-08-20 MED ORDER — IPRATROPIUM BROMIDE 0.02 % IN SOLN
0.5000 mg | Freq: Four times a day (QID) | RESPIRATORY_TRACT | Status: DC
  Administered 2019-08-20 – 2019-08-21 (×4): 0.5 mg via RESPIRATORY_TRACT
  Filled 2019-08-20 (×5): qty 2.5

## 2019-08-20 MED ORDER — FUROSEMIDE 10 MG/ML IJ SOLN
40.0000 mg | Freq: Two times a day (BID) | INTRAMUSCULAR | Status: DC
  Administered 2019-08-20 – 2019-08-21 (×2): 40 mg via INTRAVENOUS
  Filled 2019-08-20 (×2): qty 4

## 2019-08-20 MED ORDER — LEVALBUTEROL HCL 1.25 MG/0.5ML IN NEBU
1.2500 mg | INHALATION_SOLUTION | Freq: Four times a day (QID) | RESPIRATORY_TRACT | Status: DC
  Administered 2019-08-20 – 2019-08-21 (×4): 1.25 mg via RESPIRATORY_TRACT
  Filled 2019-08-20 (×5): qty 0.5

## 2019-08-20 MED ORDER — PROPOFOL 10 MG/ML IV BOLUS
INTRAVENOUS | Status: DC | PRN
  Administered 2019-08-20 (×2): 20 mg via INTRAVENOUS

## 2019-08-20 MED ORDER — PHENYLEPHRINE 40 MCG/ML (10ML) SYRINGE FOR IV PUSH (FOR BLOOD PRESSURE SUPPORT)
PREFILLED_SYRINGE | INTRAVENOUS | Status: DC | PRN
  Administered 2019-08-20: 80 ug via INTRAVENOUS

## 2019-08-20 MED ORDER — APIXABAN 5 MG PO TABS
5.0000 mg | ORAL_TABLET | Freq: Once | ORAL | Status: AC
  Administered 2019-08-20: 5 mg via ORAL
  Filled 2019-08-20 (×2): qty 1

## 2019-08-20 MED ORDER — SODIUM CHLORIDE 0.9% FLUSH
3.0000 mL | Freq: Two times a day (BID) | INTRAVENOUS | Status: DC
  Administered 2019-08-20 – 2019-08-21 (×3): 3 mL via INTRAVENOUS

## 2019-08-20 MED ORDER — FUROSEMIDE 10 MG/ML IJ SOLN
40.0000 mg | Freq: Once | INTRAMUSCULAR | Status: AC
  Administered 2019-08-20: 40 mg via INTRAVENOUS
  Filled 2019-08-20: qty 4

## 2019-08-20 MED ORDER — PROPOFOL 500 MG/50ML IV EMUL
INTRAVENOUS | Status: DC | PRN
  Administered 2019-08-20: 50 ug/kg/min via INTRAVENOUS

## 2019-08-20 MED ORDER — LORAZEPAM 2 MG/ML IJ SOLN
0.5000 mg | Freq: Once | INTRAMUSCULAR | Status: AC
  Administered 2019-08-20: 0.5 mg via INTRAVENOUS
  Filled 2019-08-20: qty 1

## 2019-08-20 NOTE — Anesthesia Postprocedure Evaluation (Signed)
Anesthesia Post Note  Patient: Tonya Bradley  Procedure(s) Performed: TRANSESOPHAGEAL ECHOCARDIOGRAM (TEE) (N/A ) CARDIOVERSION (N/A )     Patient location during evaluation: Endoscopy Anesthesia Type: MAC Level of consciousness: awake and alert Pain management: pain level controlled Vital Signs Assessment: post-procedure vital signs reviewed and stable Respiratory status: spontaneous breathing and respiratory function stable Cardiovascular status: stable Postop Assessment: no apparent nausea or vomiting Anesthetic complications: no    Last Vitals:  Vitals:   08/20/19 1000 08/20/19 1010  BP: (!) 100/56 (!) 99/56  Pulse: 98 91  Resp: (!) 32 (!) 31  Temp:    SpO2: 97% 95%    Last Pain:  Vitals:   08/20/19 0950  TempSrc: Temporal  PainSc: 0-No pain                 [name] DANIEL

## 2019-08-20 NOTE — CV Procedure (Signed)
    Electrical Cardioversion Procedure Note Tonya Bradley [id number] 1951-11-04  Procedure: Electrical Cardioversion Indications:  Atrial Flutter  Time Out: Verified patient identification, verified procedure,medications/allergies/relevent history reviewed, required imaging and test results available.  Performed  Procedure Details  The patient was NPO after midnight. Anesthesia was administered at the beside  by Dr. Tobias Alexander with  propofol.  Cardioversion was performed with synchronized biphasic defibrillation via AP pads with 120 joules.  1 attempt(s) were performed.  The patient converted to normal sinus rhythm. The patient tolerated the procedure well   IMPRESSION:  Successful cardioversion of atrial flutter On IV amiodarone. HR 100 bpm   Candee Furbish 08/20/2019, 9:45 AM

## 2019-08-20 NOTE — Progress Notes (Signed)
PROGRESS NOTE  Tonya Bradley KZS:[id number] DOB: 01/29/1951 DOA: 08/18/2019 PCP: [place], [name], MD   LOS: 1 day   Brief narrative: Tonya Bradley is a 68 y.o. female with medical history significant of diastolic CHF, SCLC currently in remission, COPD on chronic O2, paroxysmal A.Flutter s/p ablation in 2018 was admitted to the hospital for shortness of breath was found to be in atrial flutter with rapid ventricular response. Pt stated that she has been short of breath for a while now. Her palliative care nurse saw her and told her, her heart rate was in the 160s.  Continues to smoke.  Assessment/Plan:  Principal Problem:   Atrial flutter with rapid ventricular response (HCC) Active Problems:   Chronic hypoxemic respiratory failure (HCC)   Anxiolytic dependence (HCC)   Chronic pain disorder   COPD (chronic obstructive pulmonary disease) (HCC)   Small cell lung cancer, right (HCC)   Chronic diastolic CHF (congestive heart failure) (HCC)  Atrial flutter with rapid ventricular response with hypotension.  Could not tolerate iv Cardizem due to hypotension.  Admitting provider spoke with cardiology Dr Aundra Dubin and recommended amiodarone infusion.  Despite amiodarone infusion her heart rate is persistently elevated at 732K and systolic blood pressure upper 80s.  I had an extensive discussion with the patient and she wanted proceed with further treatment as necessary.  History of ablation in 2018.  Hold p.o. Cardizem from home at this time.    Patient was subsequently seen by cardiology and they recommended transfer to [facility] hospital for cardioversion.  Status post cardioversion today.   Cardiology recommended discontinuation of amiodarone after cardioversion to sinus rhythm.  Patient will need to continue anticoagulation 4 weeks post cardioversion.  Patient is DNR but wishes to proceed with further treatment.   Free T4 of 1.16.  TSH high as well. Unsure of significance. Free T3 normal at 2.4.  Will  need to repeat thyroid labs at a later date.  BNP at 84.  Magnesium of 2.2 on presentation.  Chronic hypoxic respiratory failure secondary to COPD and small cell lung cancer.  Continue home oxygen.  Supportive care.  Patient follows up with Dr. Myles Gip, oncology as outpatient.  She was supposed to get an MRI of the brain as outpatient for restaging purposes.  Patient is currently being followed by palliative care nurse  Hypokalemia.  .  Potassium 4.0 today.  Chronic pain syndrome.  Continue Norco.  Leukocytosis likely reactive.  Resolved  Dyspnea with chronic diastolic congestive failure with gross peripheral edema.   Likely decompensated congestive heart failure secondary to uncontrolled atrial flutter. Patient takes her torsemide as outpatient.    Lasix IV has been recommended by cardiology once her blood pressure improved.   VTE Prophylaxis: Eliquis  Code Status: DNR  Family Communication: None  Disposition Plan: Home likely in 1 to 2 days.  Follow cardiology recommendations.  Continue IV Lasix for decompensated heart failure as tolerated.   Consultants:  Cardiology  Procedures:  Cardioversion  Antibiotics: Anti-infectives (From admission, onward)   None     Subjective: Patient was seen before she was taken for cardioversion at [facility].  She complains of persistent dyspnea.  Her heart rate was in the 140s.  Blood pressure was marginally low.  Patient denied any chest pain, fever, chills or rigor.  Objective: Vitals:   08/20/19 1040 08/20/19 1050  BP: 107/62 102/70  Pulse: 93 95  Resp: (!) 25 (!) 24  Temp:    SpO2: 98% 97%  Intake/Output Summary (Last 24 hours) at 08/20/2019 1218 Last data filed at 08/20/2019 0951 Gross per 24 hour  Intake 537.79 ml  Output -  Net 537.79 ml   Filed Weights   08/19/19 1731 08/20/19 0420  Weight: 71.1 kg 72 kg   Body mass index is 29.03 kg/m.   Physical Exam: GENERAL: Patient is alert awake and  oriented. Not in obvious distress.  On nasal cannula oxygen HENT: No scleral pallor or icterus. Pupils equally reactive to light. Oral mucosa is moist NECK: is supple, no palpable thyroid enlargement. CHEST: Diminished breath sounds bilaterally.  No obvious wheezes CVS: S1 and S2 heard, no murmur.  tachycardic in atrial flutter. No pericardial rub. ABDOMEN: Soft, non-tender, bowel sounds are present. No palpable hepato-splenomegaly. EXTREMITIES: Gross peripheral pitting edema bilaterally. CNS: Cranial nerves are intact. No focal motor or sensory deficits. SKIN: warm and dry without rashes.  Data Review: I have personally reviewed the following laboratory data and studies,  CBC: Recent Labs  Lab 08/18/19 2128 08/19/19 0401 08/20/19 0150  WBC 11.0* 9.7 8.6  NEUTROABS 6.6  --   --   HGB 10.4* 9.5* 9.5*  HCT 33.2* 30.3* 30.3*  MCV 103.1* 104.8* 104.5*  PLT 235 210 301   Basic Metabolic Panel: Recent Labs  Lab 08/18/19 2128 08/19/19 0401 08/20/19 0150  NA 137 138 139  K 3.5 3.3* 4.0  CL 92* 97* 97*  CO2 31 32 32  GLUCOSE 113* 126* 165*  BUN 30* 24* 21  CREATININE 0.69 0.53 0.62  CALCIUM 9.6 8.7* 8.8*  MG 2.2  --  2.1  PHOS  --   --  3.1   Liver Function Tests: No results for input(s): AST, ALT, ALKPHOS, BILITOT, PROT, ALBUMIN in the last 168 hours. No results for input(s): LIPASE, AMYLASE in the last 168 hours. No results for input(s): AMMONIA in the last 168 hours. Cardiac Enzymes: No results for input(s): CKTOTAL, CKMB, CKMBINDEX, TROPONINI in the last 168 hours. BNP (last 3 results) Recent Labs    06/12/19 0845 07/03/19 1727 08/18/19 2130  BNP 2,597.8* 68.6 84.5    ProBNP (last 3 results) No results for input(s): PROBNP in the last 8760 hours.  CBG: No results for input(s): GLUCAP in the last 168 hours. Recent Results (from the past 240 hour(s))  SARS CORONAVIRUS 2 (TAT 6-24 HRS) Nasopharyngeal Nasopharyngeal Swab     Status: None   Collection Time:  08/18/19 11:20 PM   Specimen: Nasopharyngeal Swab  Result Value Ref Range Status   SARS Coronavirus 2 NEGATIVE NEGATIVE Final    Comment: (NOTE) SARS-CoV-2 target nucleic acids are NOT DETECTED. The SARS-CoV-2 RNA is generally detectable in upper and lower respiratory specimens during the acute phase of infection. Negative results do not preclude SARS-CoV-2 infection, do not rule out co-infections with other pathogens, and should not be used as the sole basis for treatment or other patient management decisions. Negative results must be combined with clinical observations, patient history, and epidemiological information. The expected result is Negative. Fact Sheet for Patients: SugarRoll.be Fact Sheet for Healthcare Providers: https://www.woods-mathews.com/ This test is not yet approved or cleared by the [place] FDA and  has been authorized for detection and/or diagnosis of SARS-CoV-2 by FDA under an Emergency Use Authorization (EUA). This EUA will remain  in effect (meaning this test can be used) for the duration of the COVID-19 declaration under Section 56 4(b)(1) of the Act, 21 U.S.C. section 360bbb-3(b)(1), unless the authorization is terminated or revoked sooner. Performed  at [facility], [place] [address], [place], [place] [address]   MRSA PCR Screening     Status: None   Collection Time: 08/19/19  7:06 PM   Specimen: Nasal Mucosa; Nasopharyngeal  Result Value Ref Range Status   MRSA by PCR NEGATIVE NEGATIVE Final    Comment:        The GeneXpert MRSA Assay (FDA approved for NASAL specimens only), is one component of a comprehensive MRSA colonization surveillance program. It is not intended to diagnose MRSA infection nor to guide or monitor treatment for MRSA infections. Performed at [facility], [place] [address] [place], [place] [address]      Studies: Dg Chest Port 1 View  Result Date:  08/20/2019 CLINICAL DATA:  Shortness of breath EXAM: PORTABLE CHEST 1 VIEW COMPARISON:  Two days ago FINDINGS: Mild increased hazy density on the right. There is postoperative volume loss on the right with diaphragm elevation. Stable distortion of the right hilum recently evaluated by CT. The left lung is clear. Normal heart size when allowing for rotation. Remote proximal right humerus fracture IMPRESSION: 1. Mild hazy density on the right, favor atelectasis. 2. Right-sided lobectomy. Electronically Signed   By: Monte Fantasia M.D.   On: 08/20/2019 05:01   Dg Chest Portable 1 View  Result Date: 08/18/2019 CLINICAL DATA:  Shortness of breath attack EXAM: PORTABLE CHEST 1 VIEW COMPARISON:  07/26/2019 07/03/2019 FINDINGS: Cardiac shadow is within normal limits. Aortic calcifications are seen. Volume loss on the right is noted consistent with the prior surgical history. No focal infiltrate or sizable effusion is noted. No bony abnormality is seen. IMPRESSION: Volume loss on the right consistent with prior lobectomy. No acute abnormality noted. Electronically Signed   By: Inez Catalina M.D.   On: 08/18/2019 21:44    Scheduled Meds: . ALPRAZolam  0.5 mg Oral BID  . apixaban  5 mg Oral BID  . atorvastatin  80 mg Oral q1800  . Chlorhexidine Gluconate Cloth  6 each Topical Daily  . cholecalciferol  2,000 Units Oral Daily  . cycloSPORINE  1 drop Both Eyes BID  . dexamethasone  2 mg Oral Daily  . fluticasone furoate-vilanterol  1 puff Inhalation Daily   And  . umeclidinium bromide  1 puff Inhalation Daily  . furosemide  40 mg Intravenous Once  . ipratropium  0.5 mg Nebulization Q6H  . levalbuterol  1.25 mg Nebulization Q6H  . magnesium oxide  400 mg Oral BID  . mouth rinse  15 mL Mouth Rinse BID  . multivitamin with minerals  1 tablet Oral Daily  . potassium chloride  40 mEq Oral BID  . QUEtiapine  150 mg Oral QHS  . rOPINIRole  0.5 mg Oral QHS  . sodium chloride flush  3 mL Intravenous Q12H     Continuous Infusions: . sodium chloride 1 mL/hr at 08/19/19 1853     Flora Lipps, MD  [facility] 08/20/2019

## 2019-08-20 NOTE — Interval H&P Note (Signed)
History and Physical Interval Note:  08/20/2019 9:03 AM  Mo[place]ino  has presented today for surgery, with the diagnosis of ATRIAL FLUTTER.  The various methods of treatment have been discussed with the patient and family. After consideration of risks, benefits and other options for treatment, the patient has consented to  Procedure(s): TRANSESOPHAGEAL ECHOCARDIOGRAM (TEE) (N/A) CARDIOVERSION (N/A) as a surgical intervention.  The patient's history has been reviewed, patient examined, no change in status, stable for surgery.  I have reviewed the patient's chart and labs.  Questions were answered to the patient's satisfaction.     [employer]

## 2019-08-20 NOTE — Transfer of Care (Signed)
Immediate Anesthesia Transfer of Care Note  Patient: Tonya Bradley  Procedure(s) Performed: TRANSESOPHAGEAL ECHOCARDIOGRAM (TEE) (N/A ) CARDIOVERSION (N/A )  Patient Location: Endoscopy Unit  Anesthesia Type:MAC  Level of Consciousness: drowsy and patient cooperative  Airway & Oxygen Therapy: Patient Spontanous Breathing and Patient connected to nasal cannula oxygen  Post-op Assessment: Report given to RN, Post -op Vital signs reviewed and stable and Patient moving all extremities X 4  Post vital signs: Reviewed and stable  Last Vitals:  Vitals Value Taken Time  BP    Temp    Pulse    Resp    SpO2      Last Pain:  Vitals:   08/20/19 0910  TempSrc: Oral  PainSc: 0-No pain         Complications: No apparent anesthesia complications

## 2019-08-20 NOTE — Progress Notes (Signed)
Cardiology Progress Note  Patient ID: Tonya Bradley MRN: [id number] DOB: [birth year] Date of Encounter: 08/20/2019  Primary Cardiologist: Carlyle Dolly, MD  Subjective  I saw and examined Tonya Bradley in the endoscopy suite prior to her cardioversion.  At that time, she reported her breathing was normal.  She was on her home oxygen level without any complaints of chest pain or shortness of breath.  She still has lower extremity edema on examination and she complained of this.  ROS:  All other ROS reviewed and negative. Pertinent positives noted in the HPI.     Inpatient Medications  Scheduled Meds: . ALPRAZolam  0.5 mg Oral BID  . apixaban  5 mg Oral BID  . atorvastatin  80 mg Oral q1800  . Chlorhexidine Gluconate Cloth  6 each Topical Daily  . cholecalciferol  2,000 Units Oral Daily  . cycloSPORINE  1 drop Both Eyes BID  . dexamethasone  2 mg Oral Daily  . fluticasone furoate-vilanterol  1 puff Inhalation Daily   And  . umeclidinium bromide  1 puff Inhalation Daily  . ipratropium  0.5 mg Nebulization Q6H  . levalbuterol  1.25 mg Nebulization Q6H  . magnesium oxide  400 mg Oral BID  . mouth rinse  15 mL Mouth Rinse BID  . multivitamin with minerals  1 tablet Oral Daily  . potassium chloride  40 mEq Oral BID  . QUEtiapine  150 mg Oral QHS  . rOPINIRole  0.5 mg Oral QHS  . sodium chloride flush  3 mL Intravenous Q12H   Continuous Infusions: . sodium chloride 1 mL/hr at 08/19/19 1853  . amiodarone 30 mg/hr (08/20/19 0948)   PRN Meds: acetaminophen, albuterol, HYDROcodone-acetaminophen, ondansetron (ZOFRAN) IV, prochlorperazine, sodium chloride flush   Vital Signs   Vitals:   08/20/19 1020 08/20/19 1030 08/20/19 1040 08/20/19 1050  BP: 105/60 105/60 107/62 102/70  Pulse: 95 94 93 95  Resp: (!) 23 (!) 24 (!) 25 (!) 24  Temp:      TempSrc:      SpO2: 95% 96% 98% 97%  Weight:      Height:        Intake/Output Summary (Last 24 hours) at 08/20/2019 1153 Last data filed  at 08/20/2019 0951 Gross per 24 hour  Intake 537.79 ml  Output -  Net 537.79 ml   Last 3 Weights 08/20/2019 08/19/2019 08/11/2019  Weight (lbs) 158 lb 11.7 oz 156 lb 12 oz 155 lb 6 oz  Weight (kg) 72 kg 71.1 kg 70.478 kg      Physical Exam   Vitals:   08/20/19 1020 08/20/19 1030 08/20/19 1040 08/20/19 1050  BP: 105/60 105/60 107/62 102/70  Pulse: 95 94 93 95  Resp: (!) 23 (!) 24 (!) 25 (!) 24  Temp:      TempSrc:      SpO2: 95% 96% 98% 97%  Weight:      Height:         Intake/Output Summary (Last 24 hours) at 08/20/2019 1153 Last data filed at 08/20/2019 0951 Gross per 24 hour  Intake 537.79 ml  Output -  Net 537.79 ml    Last 3 Weights 08/20/2019 08/19/2019 08/11/2019  Weight (lbs) 158 lb 11.7 oz 156 lb 12 oz 155 lb 6 oz  Weight (kg) 72 kg 71.1 kg 70.478 kg    Body mass index is 29.03 kg/m.  General: Well nourished, well developed, in no acute distress Head: Atraumatic, normal size  Eyes: PEERLA, EOMI  Neck: Supple,  no JVD Endocrine: No thryomegaly Cardiac: Tachycardia noted, regular rhythm  Lungs: Diminished breath sounds at the lung bases Abd: Soft, nontender, no hepatomegaly  Ext: 2+ pitting edema Musculoskeletal: No deformities, BUE and BLE strength normal and equal Skin: Warm and dry, no rashes   Neuro: Alert and oriented to person, place, time, and situation, CNII-XII grossly intact, no focal deficits  Psych: Normal mood and affect   Labs  High Sensitivity Troponin:  No results for input(s): TROPONINIHS in the last 720 hours.   Cardiac EnzymesNo results for input(s): TROPONINI in the last 168 hours. No results for input(s): TROPIPOC in the last 168 hours.  Chemistry Recent Labs  Lab 08/18/19 2128 08/19/19 0401 08/20/19 0150  NA 137 138 139  K 3.5 3.3* 4.0  CL 92* 97* 97*  CO2 31 32 32  GLUCOSE 113* 126* 165*  BUN 30* 24* 21  CREATININE 0.69 0.53 0.62  CALCIUM 9.6 8.7* 8.8*  GFRNONAA >60 >60 >60  GFRAA >60 >60 >60  ANIONGAP 14 9 10     Hematology  Recent Labs  Lab 08/18/19 2128 08/19/19 0401 08/20/19 0150  WBC 11.0* 9.7 8.6  RBC 3.22* 2.89* 2.90*  HGB 10.4* 9.5* 9.5*  HCT 33.2* 30.3* 30.3*  MCV 103.1* 104.8* 104.5*  MCH 32.3 32.9 32.8  MCHC 31.3 31.4 31.4  RDW 16.1* 16.2* 15.9*  PLT 235 210 207   BNP Recent Labs  Lab 08/18/19 2130  BNP 84.5    DDimer No results for input(s): DDIMER in the last 168 hours.   Radiology  Dg Chest Port 1 View  Result Date: 08/20/2019 CLINICAL DATA:  Shortness of breath EXAM: PORTABLE CHEST 1 VIEW COMPARISON:  Two days ago FINDINGS: Mild increased hazy density on the right. There is postoperative volume loss on the right with diaphragm elevation. Stable distortion of the right hilum recently evaluated by CT. The left lung is clear. Normal heart size when allowing for rotation. Remote proximal right humerus fracture IMPRESSION: 1. Mild hazy density on the right, favor atelectasis. 2. Right-sided lobectomy. Electronically Signed   By: Monte Fantasia M.D.   On: 08/20/2019 05:01   Dg Chest Portable 1 View  Result Date: 08/18/2019 CLINICAL DATA:  Shortness of breath attack EXAM: PORTABLE CHEST 1 VIEW COMPARISON:  07/26/2019 07/03/2019 FINDINGS: Cardiac shadow is within normal limits. Aortic calcifications are seen. Volume loss on the right is noted consistent with the prior surgical history. No focal infiltrate or sizable effusion is noted. No bony abnormality is seen. IMPRESSION: Volume loss on the right consistent with prior lobectomy. No acute abnormality noted. Electronically Signed   By: Inez Catalina M.D.   On: 08/18/2019 21:44    Patient Profile  Tonya Bradley is a 68 y.o. female with medical history significant for diastolic heart failure, atrial fibrillation status post ablation in 2018, CAD, COPD, small cell lung cancer status post chemo, ongoing tobacco abuse who was admitted on 9/17 with typical atrial flutter and volume overload.  Assessment & Plan  1.  Atrial flutter, CTI isthmus  dependent -She underwent a successful TEE cardioversion this morning with restoration of sinus rhythm -We will discontinue her amiodarone -She was taken off anticoagulation in the past, but she will need to remain on this for at least 4 weeks post cardioversion; please ensure she is given prescriptions for this at discharge -Continue Eliquis 5 mg twice daily -We will prefer extended therapy with her Eliquis, but due to anemia and bleeding issues, this can be readdressed at  a later date; for now continue anticoagulation  #Lower extreme edema -She has a normal BNP and chest x-ray appears to be stable -I wonder if there is another issue at play to explain her lower extreme edema -For now would continue her IV Lasix 40 mg twice daily  For questions or updates, please contact [facility] Please consult [email or web address] for contact info under        Signed, [place] Bells T. Audie Box, [place]  08/20/2019 11:53 AM

## 2019-08-20 NOTE — Discharge Instructions (Signed)

## 2019-08-20 NOTE — Consult Note (Signed)
   [facility] CM Inpatient Consult   08/20/2019  Jacqualin Shirkey 04-07-1951 [id number]   Patient screened for potential [facility] Care Management services due to unplanned readmission risk score of 55% and multiple hospitalizations. Patient had been active with a [facility] CM community RN in recent past.    Per chart review, Ms. Weir is followed by [facility] which follows for case management needs.   Netta Cedars, MSN, [facility] Liaison Nurse Mobile Phone [phone]  Toll free office [phone]

## 2019-08-20 NOTE — Anesthesia Preprocedure Evaluation (Addendum)
Anesthesia Evaluation  Patient identified by MRN, date of birth, ID band Patient awake    Reviewed: Allergy & Precautions, NPO status , Patient's Chart, lab work & pertinent test results  Airway Mallampati: II  TM Distance: >3 FB Neck ROM: Full    Dental  (+) Dental Advisory Given, Edentulous Lower, Edentulous Upper   Pulmonary pneumonia, COPD, Current Smoker,  S.p RIGHT VATS for upper and middle lobectomy Atelectasis secondary to mucous plugging   + rhonchi  + decreased breath sounds(-) wheezing  (-) rales    Cardiovascular + CAD  Normal cardiovascular exam+ dysrhythmias Atrial Fibrillation  Rhythm:Irregular Rate:Normal     Neuro/Psych PSYCHIATRIC DISORDERS Anxiety Depression negative neurological ROS     GI/Hepatic Neg liver ROS, hiatal hernia, GERD  ,  Endo/Other  negative endocrine ROSObesity   Renal/GU negative Renal ROS     Musculoskeletal  (+) Arthritis ,   Abdominal   Peds  Hematology  (+) Blood dyscrasia, anemia ,   Anesthesia Other Findings Day of surgery medications reviewed with the patient.  Reproductive/Obstetrics                             Anesthesia Physical  Anesthesia Plan  ASA: IV  Anesthesia Plan: MAC   Post-op Pain Management:    Induction: Intravenous  PONV Risk Score and Plan: 2 and Treatment may vary due to age or medical condition and Propofol infusion  Airway Management Planned: Natural Airway and Nasal Cannula  Additional Equipment:   Intra-op Plan:   Post-operative Plan: Extubation in OR  Informed Consent: I have reviewed the patients History and Physical, chart, labs and discussed the procedure including the risks, benefits and alternatives for the proposed anesthesia with the patient or authorized representative who has indicated his/her understanding and acceptance.     Dental advisory given  Plan Discussed with: CRNA, Anesthesiologist  and Surgeon  Anesthesia Plan Comments:        Anesthesia Quick Evaluation

## 2019-08-20 NOTE — CV Procedure (Signed)
   Transesophageal Echocardiogram  Indications: AFLUTTER  Time out performed  Propofol administered.  Anesthesia  Findings:  Left Ventricle: EF 45-50% mildly reduced but challenging to estimate due to tachycardia  Mitral Valve: normal with trace MR  Aortic Valve: Mildly calcified, no AI or AS  Tricuspid Valve: normal  Left Atrium: normal no LAA thrombus   Successful cardioversion  Candee Furbish, MD

## 2019-08-20 NOTE — Progress Notes (Addendum)
[facility] transported pt to [facility] main for TEE & cardioversion @ [phone]. Pt returned ~1130. Successful cardioversion.  Started on Eliquis. IV Lasix added.  Ambulated to bathroom with front wheeled walker.

## 2019-08-20 NOTE — Progress Notes (Signed)
  Echocardiogram Echocardiogram Transesophageal has been performed.  Tonya Bradley 08/20/2019, 9:58 AM

## 2019-08-21 ENCOUNTER — Encounter (HOSPITAL_COMMUNITY): Payer: Self-pay | Admitting: Cardiology

## 2019-08-21 DIAGNOSIS — J449 Chronic obstructive pulmonary disease, unspecified: Secondary | ICD-10-CM

## 2019-08-21 DIAGNOSIS — I5033 Acute on chronic diastolic (congestive) heart failure: Secondary | ICD-10-CM

## 2019-08-21 LAB — CBC
HCT: 30.2 % — ABNORMAL LOW (ref 36.0–46.0)
Hemoglobin: 9.3 g/dL — ABNORMAL LOW (ref 12.0–15.0)
MCH: 32.5 pg (ref 26.0–34.0)
MCHC: 30.8 g/dL (ref 30.0–36.0)
MCV: 105.6 fL — ABNORMAL HIGH (ref 80.0–100.0)
Platelets: 205 10*3/uL (ref 150–400)
RBC: 2.86 MIL/uL — ABNORMAL LOW (ref 3.87–5.11)
RDW: 15.9 % — ABNORMAL HIGH (ref 11.5–15.5)
WBC: 8.1 10*3/uL (ref 4.0–10.5)
nRBC: 0 % (ref 0.0–0.2)

## 2019-08-21 LAB — BASIC METABOLIC PANEL
Anion gap: 12 (ref 5–15)
BUN: 20 mg/dL (ref 8–23)
CO2: 33 mmol/L — ABNORMAL HIGH (ref 22–32)
Calcium: 8.5 mg/dL — ABNORMAL LOW (ref 8.9–10.3)
Chloride: 93 mmol/L — ABNORMAL LOW (ref 98–111)
Creatinine, Ser: 0.58 mg/dL (ref 0.44–1.00)
GFR calc Af Amer: >60 mL/min (ref >60)
GFR calc non Af Amer: >60 mL/min (ref >60)
Glucose, Bld: 112 mg/dL — ABNORMAL HIGH (ref 70–99)
Potassium: 3.6 mmol/L (ref 3.5–5.1)
Sodium: 138 mmol/L (ref 135–145)

## 2019-08-21 LAB — MAGNESIUM: Magnesium: 2.1 mg/dL (ref 1.7–2.4)

## 2019-08-21 LAB — PHOSPHORUS: Phosphorus: 2.9 mg/dL (ref 2.5–4.6)

## 2019-08-21 MED ORDER — AMIODARONE HCL 200 MG PO TABS
100.0000 mg | ORAL_TABLET | Freq: Every day | ORAL | Status: DC
  Administered 2019-08-21: 100 mg via ORAL
  Filled 2019-08-21: qty 1

## 2019-08-21 MED ORDER — FUROSEMIDE 40 MG PO TABS: 40.0000 mg | ORAL_TABLET | Freq: Every day | ORAL | Status: DC

## 2019-08-21 MED ORDER — APIXABAN 5 MG PO TABS: 5.0000 mg | ORAL_TABLET | Freq: Two times a day (BID) | ORAL | 0 refills | Status: AC

## 2019-08-21 MED ORDER — FUROSEMIDE 40 MG PO TABS: 40.0000 mg | ORAL_TABLET | Freq: Two times a day (BID) | ORAL | 1 refills | Status: DC

## 2019-08-21 MED ORDER — POTASSIUM CHLORIDE CRYS ER 10 MEQ PO TBCR: 10.0000 meq | EXTENDED_RELEASE_TABLET | Freq: Two times a day (BID) | ORAL | 1 refills | Status: DC

## 2019-08-21 MED ORDER — METOPROLOL TARTRATE 25 MG PO TABS: 12.5000 mg | ORAL_TABLET | Freq: Two times a day (BID) | ORAL | 3 refills | Status: DC

## 2019-08-21 MED ORDER — METOPROLOL SUCCINATE ER 25 MG PO TB24: 12.5000 mg | ORAL_TABLET | Freq: Two times a day (BID) | ORAL | 1 refills | Status: DC

## 2019-08-21 MED ORDER — AMIODARONE HCL 100 MG PO TABS: 100.0000 mg | ORAL_TABLET | Freq: Every day | ORAL | 1 refills | Status: AC

## 2019-08-21 MED ORDER — POTASSIUM CHLORIDE CRYS ER 10 MEQ PO TBCR: 10.0000 meq | EXTENDED_RELEASE_TABLET | Freq: Every day | ORAL | 1 refills | Status: DC

## 2019-08-21 MED ORDER — METOPROLOL SUCCINATE ER 25 MG PO TB24
25.0000 mg | ORAL_TABLET | Freq: Every day | ORAL | Status: DC
  Filled 2019-08-21: qty 1

## 2019-08-21 MED ORDER — METOPROLOL SUCCINATE ER 25 MG PO TB24: 12.5000 mg | ORAL_TABLET | Freq: Every day | ORAL | Status: DC

## 2019-08-21 MED ORDER — POTASSIUM CHLORIDE CRYS ER 10 MEQ PO TBCR: 10.0000 meq | EXTENDED_RELEASE_TABLET | Freq: Every day | ORAL | Status: DC

## 2019-08-21 NOTE — Progress Notes (Signed)
Pt's getting discharge

## 2019-08-21 NOTE — Plan of Care (Signed)

## 2019-08-21 NOTE — Discharge Summary (Addendum)
Physician Discharge Summary  Tonya Bradley TFT:[id number] DOB: 1951/05/25 DOA: 08/18/2019  PCP: [place], [name], MD  Admit date: 08/18/2019 Discharge date: 08/21/2019 Consultations: cardiology Admitted From: home Disposition: home  Discharge Diagnoses:  Principal Problem:   Atrial flutter with rapid ventricular response (HCC)   Acute on Chronic diastolic CHF (congestive heart failure) ([place]) Active Problems:   Chronic hypoxemic respiratory failure (HCC)   Anxiolytic dependence (HCC)   Chronic pain disorder   COPD (chronic obstructive pulmonary disease) ([place])   Small cell lung cancer, right [facility])      Hospital Course Summary: 68 y.o.femalewith medical history significant ofdiastolic CHF, SCLC currently in remission, COPD on chronic O2, who continues to smoke, h/o paroxysmal A.Flutter s/p ablation in 2018 presented with c/o shortness of breath and tachycardia (HR 160) as noted by her palliative care nurse. She was found to be in atrial flutter with rapid ventricular response.Admitting provider spoke with cardiology Dr Morton Amy recommended amiodarone infusion.Despite amiodarone infusion her heart rate is persistently elevated at 706C and systolic blood pressure upper 80s.She underwent TEE cardioversion by Dr Marlou Porch on 08/20/19. She was not on anticoagulation at home, now started on Eliquis by Cardiology. Home diuretic (Torsemide) was held on admission due to hypotension.   1.A.fib with RVR:  Patient underwent successful cardioversion on 9/18. Seen by cardiology who recommends Eliquis 5mg  BID for atleast 4 weeks post cardioversion and further course to be determined upon f/u as outpatient. Patient transitioned from IV amiodarone to oral dose 100mg  and also added metoprolol low dose (given soft BP) per cardiology recommendations. She has a BP/pulse monitor at home and holding parameters for metoprolol explained to patient .  2.Acute on chronic diastolic CHF : In the setting of #1 and  holding diuretics initially. She recieved couple of doses of IV lasix (40mg  BID) with SBP low in the 90s. Still has leg edema but improving, BNP/CXR not impressive. May have chronic venous stasis. Cardiology recommended discharge on Lasix 40 mg po bid.   3. SCLC: Completed surgery, had 3 cycles of chemo before this was stopped due to toxicity.Currently in remission with observation.CT scan a couple of weeks ago showed no evidence of recurrence.Patient follows up with Dr. Mahmoud,oncologyas outpatient. She planned fo an MRI of the brain as outpatientfor restaging purposes.Patient is currently being followed by palliative care nurse.  4. Hypokalemia: Replaced.K level of 3.6 prior to discharge. She may resume home dose of 10 meq--changed to BID to be taken with lasix  5. COPD, Chronic respiratory failure: Stable with no wheezing. Resume home inhaler therapy and chronic 02 -3lits [place]  6. Leukocytosis likely reactive.Resolved   7. Chronic pain syndrome. Resumed Norco.   Discharge Exam:  Vitals:   08/21/19 1200 08/21/19 1400  BP:  (!) 100/52  Pulse:  99  Resp:  20  Temp: 98.1 F (36.7 C) 98.2 F (36.8 C)  SpO2:  93%   Vitals:   08/21/19 1100 08/21/19 1146 08/21/19 1200 08/21/19 1400  BP:  (!) 99/52  (!) 100/52  Pulse: 96   99  Resp: (!) 27   20  Temp:   98.1 F (36.7 C) 98.2 F (36.8 C)  TempSrc:   Oral Oral  SpO2: 96%   93%  Weight:      Height:        General: Pt is alert, awake, not in acute distress Cardiovascular: RRR, S1/S2 +, no rubs, no gallops Respiratory: CTA bilaterally, no wheezing, no rhonchi Abdominal: Soft, NT, ND, bowel sounds +  Extremities: 1+ pitting edema, no cyanosis  Discharge Condition:Stable CODE STATUS: Diet recommendation: Recommendations for Outpatient Follow-up:  1. Follow up with PCP:  2. Follow up with consultants:  3. Please obtain follow up labs including:   Home Health services upon discharge:  Equipment/Devices upon  discharge:   Discharge Instructions:  Discharge Instructions    ([place]) Call MD:  Anytime you have any of the following symptoms: 1) 3 pound weight gain in 24 hours or 5 pounds in 1 week 2) shortness of breath, with or without a dry hacking cough 3) swelling in the hands, feet or stomach 4) if you have to sleep on extra pillows at night in order to breathe.   Complete by: As directed    Call MD for:  difficulty breathing, headache or visual disturbances   Complete by: As directed    Call MD for:  extreme fatigue   Complete by: As directed    Call MD for:  persistant dizziness or light-headedness   Complete by: As directed    Call MD for:  severe uncontrolled pain   Complete by: As directed    Call MD for:  temperature >100.4   Complete by: As directed    Diet - low sodium heart healthy   Complete by: As directed    Discharge instructions   Complete by: As directed    Follow up PCP in 5 days, cardiology CHF clinic as scheduled in 2 weeks   Heart Failure patients record your daily weight using the same scale at the same time of day   Complete by: As directed    Increase activity slowly   Complete by: As directed    STOP any activity that causes chest pain, shortness of breath, dizziness, sweating, or exessive weakness   Complete by: As directed      Allergies as of 08/21/2019      Reactions   Morphine And Related Other (See Comments)   hallucinations      Medication List    STOP taking these medications   diltiazem 60 MG 12 hr capsule Commonly known as: CARDIZEM SR   torsemide 20 MG tablet Commonly known as: DEMADEX     TAKE these medications   ALPRAZolam 0.5 MG tablet Commonly known as: XANAX Take 1 tablet (0.5 mg total) by mouth 2 (two) times daily.   amiodarone 100 MG tablet Commonly known as: PACERONE Take 1 tablet (100 mg total) by mouth daily. Start taking on: August 22, 2019   apixaban 5 MG Tabs tablet Commonly known as: ELIQUIS Take  1 tablet (5 mg total) by mouth 2 (two) times daily.   atorvastatin 80 MG tablet Commonly known as: LIPITOR TAKE 1 TABLET(80 MG) BY MOUTH DAILY What changed: See the new instructions.   cycloSPORINE 0.05 % ophthalmic emulsion Commonly known as: RESTASIS Place 1 drop into both eyes 2 (two) times daily.   dexamethasone 4 MG tablet Commonly known as: DECADRON Take 2 mg by mouth daily.   furosemide 40 MG tablet Commonly known as: LASIX Take 1 tablet (40 mg total) by mouth 2 (two) times daily. Take extra dose as needed for leg swellings/weight gain/shortness of breath   HYDROcodone-acetaminophen 5-325 MG tablet Commonly known as: NORCO/VICODIN Take 1 tablet by mouth every 6 (six) hours as needed for moderate pain.   ipratropium-albuterol 0.5-2.5 (3) MG/3ML Soln Commonly known as: DUONEB Take 3 mLs by nebulization every 4 (four) hours as needed (shortness of breath or wheezing).  MAGnesium-Oxide 400 (241.3 Mg) MG tablet Generic drug: magnesium oxide TAKE 1 TABLET BY MOUTH TWICE DAILY What changed: how much to take   metoprolol tartrate 25 MG tablet Commonly known as: LOPRESSOR Take 0.5 tablets (12.5 mg total) by mouth 2 (two) times daily. Hold SBP<95, HR <60   multivitamin with minerals Tabs tablet Take 1 tablet by mouth daily.   potassium chloride 10 MEQ tablet Commonly known as: K-DUR Take 1 tablet (10 mEq total) by mouth daily. Start taking on: August 22, 2019   prochlorperazine 10 MG tablet Commonly known as: COMPAZINE TAKE 1 TABLET(10 MG) BY MOUTH EVERY 6 HOURS AS NEEDED FOR NAUSEA OR VOMITING What changed: See the new instructions.   QUEtiapine 100 MG tablet Commonly known as: SEROQUEL TAKE 1 AND 1/2 TABLETS(150 MG) BY MOUTH AT BEDTIME What changed: See the new instructions.   rOPINIRole 0.25 MG tablet Commonly known as: REQUIP Take 2 tablets (0.5 mg total) by mouth at bedtime.   Trelegy Ellipta 100-62.5-25 MCG/INH Aepb Generic drug:  Fluticasone-Umeclidin-Vilant Inhale 1 puff into the lungs daily.   Ventolin HFA 108 (90 Base) MCG/ACT inhaler Generic drug: albuterol INHALE 2 PUFFS INTO THE LUNGS EVERY 6 HOURS AS NEEDED FOR WHEEZING OR SHORTNESS OF BREATH What changed: See the new instructions.   Vitamin D 50 MCG (2000 UT) Caps Take 2,000 Units by mouth daily.       Allergies  Allergen Reactions  . Morphine And Related Other (See Comments)    hallucinations      The results of significant diagnostics from this hospitalization (including imaging, microbiology, ancillary and laboratory) are listed below for reference.    Labs: BNP (last 3 results) Recent Labs    06/12/19 0845 07/03/19 1727 08/18/19 2130  BNP 2,597.8* 68.6 23.5   Basic Metabolic Panel: Recent Labs  Lab 08/18/19 2128 08/19/19 0401 08/20/19 0150 08/21/19 0210  NA 137 138 139 138  K 3.5 3.3* 4.0 3.6  CL 92* 97* 97* 93*  CO2 31 32 32 33*  GLUCOSE 113* 126* 165* 112*  BUN 30* 24* 21 20  CREATININE 0.69 0.53 0.62 0.58  CALCIUM 9.6 8.7* 8.8* 8.5*  MG 2.2  --  2.1 2.1  PHOS  --   --  3.1 2.9   Liver Function Tests: No results for input(s): AST, ALT, ALKPHOS, BILITOT, PROT, ALBUMIN in the last 168 hours. No results for input(s): LIPASE, AMYLASE in the last 168 hours. No results for input(s): AMMONIA in the last 168 hours. CBC: Recent Labs  Lab 08/18/19 2128 08/19/19 0401 08/20/19 0150 08/21/19 0210  WBC 11.0* 9.7 8.6 8.1  NEUTROABS 6.6  --   --   --   HGB 10.4* 9.5* 9.5* 9.3*  HCT 33.2* 30.3* 30.3* 30.2*  MCV 103.1* 104.8* 104.5* 105.6*  PLT 235 210 207 205   Cardiac Enzymes: No results for input(s): CKTOTAL, CKMB, CKMBINDEX, TROPONINI in the last 168 hours. BNP: Invalid input(s): POCBNP CBG: No results for input(s): GLUCAP in the last 168 hours. D-Dimer No results for input(s): DDIMER in the last 72 hours. Hgb A1c No results for input(s): HGBA1C in the last 72 hours. Lipid Profile No results for input(s): CHOL,  HDL, LDLCALC, TRIG, CHOLHDL, LDLDIRECT in the last 72 hours. Thyroid function studies Recent Labs    08/19/19 0401  TSH 6.706*  T3FREE 2.4   Anemia work up No results for input(s): VITAMINB12, FOLATE, FERRITIN, TIBC, IRON, RETICCTPCT in the last 72 hours. Urinalysis    Component Value Date/Time  COLORURINE YELLOW 07/03/2019 [place] 07/03/2019 1727   LABSPEC 1.012 07/03/2019 1727   PHURINE 5.0 07/03/2019 1727   GLUCOSEU NEGATIVE 07/03/2019 1727   GLUCOSEU NEGATIVE 08/04/2018 1721   HGBUR NEGATIVE 07/03/2019 1727   BILIRUBINUR NEGATIVE 07/03/2019 1727   KETONESUR NEGATIVE 07/03/2019 1727   PROTEINUR NEGATIVE 07/03/2019 1727   UROBILINOGEN 0.2 08/04/2018 1721   NITRITE NEGATIVE 07/03/2019 1727   LEUKOCYTESUR NEGATIVE 07/03/2019 1727   Sepsis Labs Invalid input(s): PROCALCITONIN,  WBC,  LACTICIDVEN Microbiology Recent Results (from the past 240 hour(s))  SARS CORONAVIRUS 2 (TAT 6-24 HRS) Nasopharyngeal Nasopharyngeal Swab     Status: None   Collection Time: 08/18/19 11:20 PM   Specimen: Nasopharyngeal Swab  Result Value Ref Range Status   SARS Coronavirus 2 NEGATIVE NEGATIVE Final    Comment: (NOTE) SARS-CoV-2 target nucleic acids are NOT DETECTED. The SARS-CoV-2 RNA is generally detectable in upper and lower respiratory specimens during the acute phase of infection. Negative results do not preclude SARS-CoV-2 infection, do not rule out co-infections with other pathogens, and should not be used as the sole basis for treatment or other patient management decisions. Negative results must be combined with clinical observations, patient history, and epidemiological information. The expected result is Negative. Fact Sheet for Patients: SugarRoll.be Fact Sheet for Healthcare Providers: https://www.woods-mathews.com/ This test is not yet approved or cleared by the [place] FDA and  has been authorized for  detection and/or diagnosis of SARS-CoV-2 by FDA under an Emergency Use Authorization (EUA). This EUA will remain  in effect (meaning this test can be used) for the duration of the COVID-19 declaration under Section 56 4(b)(1) of the Act, 21 U.S.C. section 360bbb-3(b)(1), unless the authorization is terminated or revoked sooner. Performed at [facility], [place] [address], [place], [place] [address]   MRSA PCR Screening     Status: None   Collection Time: 08/19/19  7:06 PM   Specimen: Nasal Mucosa; Nasopharyngeal  Result Value Ref Range Status   MRSA by PCR NEGATIVE NEGATIVE Final    Comment:        The GeneXpert MRSA Assay (FDA approved for NASAL specimens only), is one component of a comprehensive MRSA colonization surveillance program. It is not intended to diagnose MRSA infection nor to guide or monitor treatment for MRSA infections. Performed at [facility], [place] [address] [place], [place] [address]     Procedures/Studies: Ct Chest W Contrast  Result Date: 07/26/2019 CLINICAL DATA:  Restaging lung cancer, s/p right upper lobectomy and chemotherapy EXAM: CT CHEST WITH CONTRAST TECHNIQUE: Multidetector CT imaging of the chest was performed during intravenous contrast administration. CONTRAST:  78mL OMNIPAQUE IOHEXOL 300 MG/ML  SOLN COMPARISON:  06/13/2019, 02/15/2019 FINDINGS: Cardiovascular: Aortic atherosclerosis. Normal heart size. Extensive 3 vessel coronary artery calcifications. No pericardial effusion. Mediastinum/Nodes: No enlarged mediastinal, hilar, or axillary lymph nodes. Frothy debris in the trachea. Thyroid gland and esophagus demonstrate no significant findings. Lungs/Pleura: Status post right upper lobectomy. Mild bandlike scarring of the left lung base. No pleural effusion or pneumothorax. Upper Abdomen: No acute abnormality. Musculoskeletal: No chest wall mass or suspicious bone lesions identified. IMPRESSION: 1. Unchanged postoperative  findings status post right upper lobectomy. No evidence of malignant recurrence. 2.  Previously seen small pleural effusions are resolved. 3.  Aortic atherosclerosis and coronary artery disease. Electronically Signed   By: Eddie Candle M.D.   On: 07/26/2019 10:38   Dg Chest Port 1 View  Result Date: 08/20/2019 CLINICAL DATA:  Shortness of breath EXAM: PORTABLE CHEST 1 VIEW COMPARISON:  Two days ago FINDINGS: Mild increased hazy density on the right. There is postoperative volume loss on the right with diaphragm elevation. Stable distortion of the right hilum recently evaluated by CT. The left lung is clear. Normal heart size when allowing for rotation. Remote proximal right humerus fracture IMPRESSION: 1. Mild hazy density on the right, favor atelectasis. 2. Right-sided lobectomy. Electronically Signed   By: Monte Fantasia M.D.   On: 08/20/2019 05:01   Dg Chest Portable 1 View  Result Date: 08/18/2019 CLINICAL DATA:  Shortness of breath attack EXAM: PORTABLE CHEST 1 VIEW COMPARISON:  07/26/2019 07/03/2019 FINDINGS: Cardiac shadow is within normal limits. Aortic calcifications are seen. Volume loss on the right is noted consistent with the prior surgical history. No focal infiltrate or sizable effusion is noted. No bony abnormality is seen. IMPRESSION: Volume loss on the right consistent with prior lobectomy. No acute abnormality noted. Electronically Signed   By: Inez Catalina M.D.   On: 08/18/2019 21:44     Time coordinating discharge: Over 30 minutes  SIGNED:   [place]il[place] Shi, MD  [facility] 08/21/2019, 2:45 PM Pager : [phone]

## 2019-08-21 NOTE — Progress Notes (Addendum)
Cardiology Progress Note  Patient ID: Tonya Bradley MRN: [id number] DOB: [birth year] Date of Encounter: 08/21/2019  Primary Cardiologist: Carlyle Dolly, MD  Subjective  I .    "I want to go home"   Breathing is OK   No CP   Inpatient Medications  Scheduled Meds:  ALPRAZolam  0.5 mg Oral BID   apixaban  5 mg Oral BID   atorvastatin  80 mg Oral q1800   Chlorhexidine Gluconate Cloth  6 each Topical Daily   cholecalciferol  2,000 Units Oral Daily   cycloSPORINE  1 drop Both Eyes BID   dexamethasone  2 mg Oral Daily   fluticasone furoate-vilanterol  1 puff Inhalation Daily   And   umeclidinium bromide  1 puff Inhalation Daily   furosemide  40 mg Intravenous BID   ipratropium  0.5 mg Nebulization Q6H   levalbuterol  1.25 mg Nebulization Q6H   magnesium oxide  400 mg Oral BID   mouth rinse  15 mL Mouth Rinse BID   multivitamin with minerals  1 tablet Oral Daily   potassium chloride  40 mEq Oral BID   QUEtiapine  150 mg Oral QHS   rOPINIRole  0.5 mg Oral QHS   sodium chloride flush  3 mL Intravenous Q12H   Continuous Infusions:  sodium chloride 1 mL/hr at 08/19/19 1853   PRN Meds: acetaminophen, albuterol, HYDROcodone-acetaminophen, ondansetron (ZOFRAN) IV, prochlorperazine, sodium chloride flush   Vital Signs   Vitals:   08/21/19 0122 08/21/19 0400 08/21/19 0424 08/21/19 0651  BP:   (!) 92/51   Pulse:   88   Resp:   (!) 25   Temp:  97.9 F (36.6 C)    TempSrc:  Oral    SpO2: 97%  100%   Weight:    71.3 kg  Height:        Intake/Output Summary (Last 24 hours) at 08/21/2019 0717 Last data filed at 08/21/2019 0600 Gross per 24 hour  Intake 1316.57 ml  Output --  Net 1316.57 ml   Last 3 Weights 08/21/2019 08/20/2019 08/19/2019  Weight (lbs) 157 lb 3 oz 158 lb 11.7 oz 156 lb 12 oz  Weight (kg) 71.3 kg 72 kg 71.1 kg      Physical Exam   Vitals:   08/21/19 0122 08/21/19 0400 08/21/19 0424 08/21/19 0651  BP:   (!) 92/51   Pulse:   88   Resp:    (!) 25   Temp:  97.9 F (36.6 C)    TempSrc:  Oral    SpO2: 97%  100%   Weight:    71.3 kg  Height:         Intake/Output Summary (Last 24 hours) at 08/21/2019 0717 Last data filed at 08/21/2019 0600 Gross per 24 hour  Intake 1316.57 ml  Output --  Net 1316.57 ml    Last 3 Weights 08/21/2019 08/20/2019 08/19/2019  Weight (lbs) 157 lb 3 oz 158 lb 11.7 oz 156 lb 12 oz  Weight (kg) 71.3 kg 72 kg 71.1 kg    Body mass index is 28.75 kg/m.  General: Well nourished, well developed, in no acute distress Head: Atraumatic, normal size  Neck: Supple, no JVD Cardiac: RRR  No signif murmurs   Lungs: Diminished breath sounds at the lung bases Abd: Soft, nontender, no hepatomegaly  Ext: Tr to 1+ edemaPsych: Normal mood and affect   Labs  High Sensitivity Troponin:  No results for input(s): TROPONINIHS in the last 720 hours.  Cardiac EnzymesNo results for input(s): TROPONINI in the last 168 hours. No results for input(s): TROPIPOC in the last 168 hours.  Chemistry Recent Labs  Lab 08/19/19 0401 08/20/19 0150 08/21/19 0210  NA 138 139 138  K 3.3* 4.0 3.6  CL 97* 97* 93*  CO2 32 32 33*  GLUCOSE 126* 165* 112*  BUN 24* 21 20  CREATININE 0.53 0.62 0.58  CALCIUM 8.7* 8.8* 8.5*  GFRNONAA >60 >60 >60  GFRAA >60 >60 >60  ANIONGAP 9 10 12     Hematology Recent Labs  Lab 08/19/19 0401 08/20/19 0150 08/21/19 0210  WBC 9.7 8.6 8.1  RBC 2.89* 2.90* 2.86*  HGB 9.5* 9.5* 9.3*  HCT 30.3* 30.3* 30.2*  MCV 104.8* 104.5* 105.6*  MCH 32.9 32.8 32.5  MCHC 31.4 31.4 30.8  RDW 16.2* 15.9* 15.9*  PLT 210 207 205   BNP Recent Labs  Lab 08/18/19 2130  BNP 84.5    DDimer No results for input(s): DDIMER in the last 168 hours.   Radiology  Dg Chest Port 1 View  Result Date: 08/20/2019 CLINICAL DATA:  Shortness of breath EXAM: PORTABLE CHEST 1 VIEW COMPARISON:  Two days ago FINDINGS: Mild increased hazy density on the right. There is postoperative volume loss on the right with diaphragm  elevation. Stable distortion of the right hilum recently evaluated by CT. The left lung is clear. Normal heart size when allowing for rotation. Remote proximal right humerus fracture IMPRESSION: 1. Mild hazy density on the right, favor atelectasis. 2. Right-sided lobectomy. Electronically Signed   By: Monte Fantasia M.D.   On: 08/20/2019 05:01    Patient Profile  Tonya Bradley is a 68 y.o. female with medical history significant for diastolic heart failure, atrial fibrillation status post ablation in 2018, CAD, COPD, small cell lung cancer status post chemo, ongoing tobacco abuse who was admitted on 9/17 with typical atrial flutter and volume overload.  Assessment & Plan  1.  Atrial flutter, CTI isthmus dependent   Pt s/p ablation in past    Yesterday underwent cardioversion   REmains in SR   HR 90s to 100  Needs Eliquis bid for at least 1 month     REview of records, in June she was on 100 mg amiodarone and dilt and metoprolol   I think she should go back on amio to help maintain SR   I would also add low dose toprol      I have reviewed with Beckie Salts    2  Acute on chronic diastolic CHF   Pt says she urinated with lasix   I/O ? Accuracy     Would give lasix IV this AM    WIll need PO lasix   She was on lasix earlier this summer   It was switched to torsemide in AUgust  Pt says she didn't notice much difference Watch Na and fluid  2 g Na   1500 cc fluid Will give lasix 40 po bid    3  Pulmonary    Pt with signif COPD and now lung Ca   Followed by oncology and palliative care   Pt has appt in clinic on Wed   Of this week      For questions or updates, please contact [place] Please consult [email or web address] for contact info under        Signed, [place] T. Audie Box, [place]  08/21/2019 7:17 AM

## 2019-08-23 ENCOUNTER — Other Ambulatory Visit: Payer: Self-pay | Admitting: Radiation Oncology

## 2019-08-23 ENCOUNTER — Ambulatory Visit (HOSPITAL_COMMUNITY)
Admission: RE | Admit: 2019-08-23 | Discharge: 2019-08-23 | Disposition: A | Discharge status: Home / Self Care | Payer: Medicare HMO | Source: Ambulatory Visit | Attending: Radiation Oncology | Admitting: Radiation Oncology

## 2019-08-23 ENCOUNTER — Other Ambulatory Visit: Payer: Self-pay

## 2019-08-23 DIAGNOSIS — C3491 Malignant neoplasm of unspecified part of right bronchus or lung: Secondary | ICD-10-CM

## 2019-08-23 NOTE — Progress Notes (Signed)
Pt came for MRI on 08-23-19. Pt was stuck 4 times by  Different techs and her views blew each time. Pt advised to hydrate before she comes back Thursday am. Pt brain pre was done today and post will be done Thursday.

## 2019-08-25 ENCOUNTER — Encounter: Payer: Self-pay | Admitting: Physician Assistant

## 2019-08-25 ENCOUNTER — Ambulatory Visit (INDEPENDENT_AMBULATORY_CARE_PROVIDER_SITE_OTHER): Payer: Medicare HMO | Admitting: Physician Assistant

## 2019-08-25 ENCOUNTER — Other Ambulatory Visit: Payer: Self-pay

## 2019-08-25 ENCOUNTER — Ambulatory Visit: Payer: Medicare HMO | Admitting: Physician Assistant

## 2019-08-25 VITALS — BP 138/66 | HR 100 | Ht 62.0 in | Wt 155.0 lb

## 2019-08-25 DIAGNOSIS — Z85118 Personal history of other malignant neoplasm of bronchus and lung: Secondary | ICD-10-CM | POA: Diagnosis not present

## 2019-08-25 DIAGNOSIS — Z720 Tobacco use: Secondary | ICD-10-CM

## 2019-08-25 DIAGNOSIS — I10 Essential (primary) hypertension: Secondary | ICD-10-CM

## 2019-08-25 DIAGNOSIS — I4892 Unspecified atrial flutter: Secondary | ICD-10-CM

## 2019-08-25 DIAGNOSIS — I2510 Atherosclerotic heart disease of native coronary artery without angina pectoris: Secondary | ICD-10-CM | POA: Diagnosis not present

## 2019-08-25 DIAGNOSIS — I5033 Acute on chronic diastolic (congestive) heart failure: Secondary | ICD-10-CM | POA: Diagnosis not present

## 2019-08-25 MED ORDER — METOPROLOL TARTRATE 25 MG PO TABS: 25.0000 mg | ORAL_TABLET | Freq: Two times a day (BID) | ORAL | 3 refills | Status: AC

## 2019-08-25 MED ORDER — FUROSEMIDE 40 MG PO TABS: 40.0000 mg | ORAL_TABLET | Freq: Two times a day (BID) | ORAL | 1 refills | Status: AC

## 2019-08-25 NOTE — Patient Instructions (Addendum)
Medication Instructions:  Your physician has recommended you make the following change in your medication:   INCREASE LASIX TO 80 MG TWICE A DAY FOR 3 DAYS THEN RESUME USUAL DOSE (40mg  twice daily) INCREASE POTASSIUM TO 2 TABLETS (20 Meq) DAILY FOR 3 DAYS THEN RESUME USUAL DOSE 1 tablet (10 meq) daily  INCREASE METOPROLOL TO 25 MG TWICE A DAY   If you need a refill on your cardiac medications before your next appointment, please call your pharmacy.   Lab work: None Ordered  Testing/Procedures: None Ordered  Follow-Up: At [facility], you and your health needs are our priority.  As part of our continuing mission to provide you with exceptional heart care, we have created designated Provider Care Teams.  These Care Teams include your primary Cardiologist (physician) and Advanced Practice Providers (APPs -  Physician Assistants and Nurse Practitioners) who all work together to provide you with the care you need, when you need it. . Dr. Lovena Le or his team next available appt  Any Other Special Instructions Will Be Listed Below (If Applicable).  Low-Sodium Eating Plan Sodium, which is an element that makes up salt, helps you maintain a healthy balance of fluids in your body. Too much sodium can increase your blood pressure and cause fluid and waste to be held in your body. Your health care provider or dietitian may recommend following this plan if you have high blood pressure (hypertension), kidney disease, liver disease, or heart failure. Eating less sodium can help lower your blood pressure, reduce swelling, and protect your heart, liver, and kidneys. What are tips for following this plan? General guidelines  Most people on this plan should limit their sodium intake to 1,500-2,000 mg (milligrams) of sodium each day. Reading food labels   The Nutrition Facts label lists the amount of sodium in one serving of the food. If you eat more than one serving, you must multiply the listed  amount of sodium by the number of servings.  Choose foods with less than 140 mg of sodium per serving.  Avoid foods with 300 mg of sodium or more per serving. Shopping  Look for lower-sodium products, often labeled as "low-sodium" or "no salt added."  Always check the sodium content even if foods are labeled as "unsalted" or "no salt added".  Buy fresh foods. ? Avoid canned foods and premade or frozen meals. ? Avoid canned, cured, or processed meats  Buy breads that have less than 80 mg of sodium per slice. Cooking  Eat more home-cooked food and less restaurant, buffet, and fast food.  Avoid adding salt when cooking. Use salt-free seasonings or herbs instead of table salt or sea salt. Check with your health care provider or pharmacist before using salt substitutes.  Cook with plant-based oils, such as canola, sunflower, or olive oil. Meal planning  When eating at a restaurant, ask that your food be prepared with less salt or no salt, if possible.  Avoid foods that contain MSG (monosodium glutamate). MSG is sometimes added to [place] food, bouillon, and some canned foods. What foods are recommended? The items listed may not be a complete list. Talk with your dietitian about what dietary choices are best for you. Grains Low-sodium cereals, including oats, puffed wheat and rice, and shredded wheat. Low-sodium crackers. Unsalted rice. Unsalted pasta. Low-sodium bread. Whole-grain breads and whole-grain pasta. Vegetables Fresh or frozen vegetables. "No salt added" canned vegetables. "No salt added" tomato sauce and paste. Low-sodium or reduced-sodium tomato and vegetable juice. Fruits Fresh,  frozen, or canned fruit. Fruit juice. Meats and other protein foods Fresh or frozen (no salt added) meat, poultry, seafood, and fish. Low-sodium canned tuna and salmon. Unsalted nuts. Dried peas, beans, and lentils without added salt. Unsalted canned beans. Eggs. Unsalted nut  butters. Dairy Milk. Soy milk. Cheese that is naturally low in sodium, such as ricotta cheese, fresh mozzarella, or Swiss cheese Low-sodium or reduced-sodium cheese. Cream cheese. Yogurt. Fats and oils Unsalted butter. Unsalted margarine with no trans fat. Vegetable oils such as canola or olive oils. Seasonings and other foods Fresh and dried herbs and spices. Salt-free seasonings. Low-sodium mustard and ketchup. Sodium-free salad dressing. Sodium-free light mayonnaise. Fresh or refrigerated horseradish. Lemon juice. Vinegar. Homemade, reduced-sodium, or low-sodium soups. Unsalted popcorn and pretzels. Low-salt or salt-free chips. What foods are not recommended? The items listed may not be a complete list. Talk with your dietitian about what dietary choices are best for you. Grains Instant hot cereals. Bread stuffing, pancake, and biscuit mixes. Croutons. Seasoned rice or pasta mixes. Noodle soup cups. Boxed or frozen macaroni and cheese. Regular salted crackers. Self-rising flour. Vegetables Sauerkraut, pickled vegetables, and relishes. Olives. [place] fries. Onion rings. Regular canned vegetables (not low-sodium or reduced-sodium). Regular canned tomato sauce and paste (not low-sodium or reduced-sodium). Regular tomato and vegetable juice (not low-sodium or reduced-sodium). Frozen vegetables in sauces. Meats and other protein foods Meat or fish that is salted, canned, smoked, spiced, or pickled. Bacon, ham, sausage, hotdogs, corned beef, chipped beef, packaged lunch meats, salt pork, jerky, pickled herring, anchovies, regular canned tuna, sardines, salted nuts. Dairy Processed cheese and cheese spreads. Cheese curds. Blue cheese. Feta cheese. String cheese. Regular cottage cheese. Buttermilk. Canned milk. Fats and oils Salted butter. Regular margarine. Ghee. Bacon fat. Seasonings and other foods Onion salt, garlic salt, seasoned salt, table salt, and sea salt. Canned and packaged gravies.  Worcestershire sauce. Tartar sauce. Barbecue sauce. Teriyaki sauce. Soy sauce, including reduced-sodium. Steak sauce. Fish sauce. Oyster sauce. Cocktail sauce. Horseradish that you find on the shelf. Regular ketchup and mustard. Meat flavorings and tenderizers. Bouillon cubes. Hot sauce and Tabasco sauce. Premade or packaged marinades. Premade or packaged taco seasonings. Relishes. Regular salad dressings. Salsa. Potato and tortilla chips. Corn chips and puffs. Salted popcorn and pretzels. Canned or dried soups. Pizza. Frozen entrees and pot pies. Summary  Eating less sodium can help lower your blood pressure, reduce swelling, and protect your heart, liver, and kidneys.  Most people on this plan should limit their sodium intake to 1,500-2,000 mg (milligrams) of sodium each day.  Canned, boxed, and frozen foods are high in sodium. Restaurant foods, fast foods, and pizza are also very high in sodium. You also get sodium by adding salt to food.  Try to cook at home, eat more fresh fruits and vegetables, and eat less fast food, canned, processed, or prepared foods. This information is not intended to replace advice given to you by your health care provider. Make sure you discuss any questions you have with your health care provider. Document Released: 05/10/2002 Document Revised: 10/31/2017 Document Reviewed: 11/11/2016 Elsevier Patient Education  2020 [employer].

## 2019-08-25 NOTE — Progress Notes (Signed)
Cardiology Office Note    Date:  08/25/2019   ID:  Tonya Bradley, DOB October 30, 1951, MRN [id number]  PCP:  [place], [name], MD  Cardiologist: Carlyle Dolly, MD EPS: None  No chief complaint on file.   History of Present Illness:  Tonya Bradley is a 68 y.o. 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 Underwent cardioversion 08/20/19 and restarted on Amiodarone and toprol.  Patient comes in today not wearing her oxygen and her O2 sats were initially 85%.  When she put her oxygen on and it came up to 97%.  She is very short of breath and legs are swollen.  She is taking her Lasix twice a day.  Weight is 155 pounds similar to when she was in the hospital.  TEELVEF estimated 45 to 50% but was hindered because of tachycardia.  LVEF was 60 to 65% 06/2019     Past Medical History:  Diagnosis Date  . Acute on chronic diastolic (congestive) heart failure ([place]) 06/12/2019  . AKI (acute kidney injury) ([place])   . Anemia   . Anxiety   . Arthritis    "back"  . Atrial flutter ([place]) 03/13/2016  . CAD (coronary artery disease)   . Cancer ([place])    skin cancer  . Cataract   . Colon polyp   . Complication of anesthesia    BP dropped during esophagus diltation in 2017-admitted for 3 days.   Marland Kitchen COPD (chronic obstructive pulmonary disease) ([place])   . Depression   . Dysrhythmia   . Emphysema of lung ([place])   . GERD (gastroesophageal reflux disease)   . Hypercholesterolemia   . Lung cancer ([place]) dx'd 01/2019  . Osteoporosis   . Oxygen deficiency     Past Surgical History:  Procedure Laterality Date  . A-FLUTTER ABLATION N/A 05/19/2017   Procedure: A-Flutter Ablation;  Surgeon: Evans Lance, MD;  Location: [facility] LAB;  Service: Cardiovascular;  Laterality: N/A;  . APPENDECTOMY    . BASAL CELL CARCINOMA  EXCISION    . CARDIAC CATHETERIZATION    . CARDIOVERSION N/A 08/20/2019   Procedure: CARDIOVERSION;  Surgeon: Jerline Pain, MD;  Location: [facility] ENDOSCOPY;  Service: Cardiovascular;  Laterality: N/A;  . CATARACT EXTRACTION Bilateral   . CHOLECYSTECTOMY    . COLONOSCOPY N/A 06/23/2014   Dr. Rourk:Rectal and colonic polyps-removed, Pancolonic diverticulosis. lymphocytic colitis  . CORONARY ANGIOPLASTY WITH STENT PLACEMENT    . ESOPHAGEAL DILATION N/A 03/13/2016   Procedure: ESOPHAGEAL DILATION;  Surgeon: Daneil Dolin, MD;  Location: [facility] ENDO SU[facility];  Service: Endoscopy;  Laterality: N/A;  . ESOPHAGOGASTRODUODENOSCOPY N/A 03/13/2016   Procedure: ESOPHAGOGASTRODUODENOSCOPY (EGD);  Surgeon: Daneil Dolin, MD;  Location: [facility] ENDO SUITE;  Service: Endoscopy;  Laterality: N/A;  0930  . EYE SURGERY     cataract removed, bilateral  . IR THORACENTESIS ASP PLEURAL SPACE W/IMG GUIDE  02/15/2019  . LUMBAR LAMINECTOMY/DECOMPRESSION MICRODISCECTOMY N/A 01/19/2018   Procedure: L4-5 DECOMPRESSION;  Surgeon: Marybelle Killings, MD;  Location: [facility];  Service: Orthopedics;  Laterality: N/A;  . TEE WITHOUT CARDIOVERSION N/A 08/20/2019   Procedure: TRANSESOPHAGEAL ECHOCARDIOGRAM (TEE);  Surgeon: Jerline Pain, MD;  Location: [facility] ENDOSCOPY;  Service: Cardiovascular;  Laterality: N/A;  . VIDEO ASSISTED THORACOSCOPY (VATS)/ LOBECTOMY Right 02/04/2019   Procedure: VIDEO ASSISTED THORACOSCOPY (VATS)/RIGHT UPPER AND  MIDDLE LOBECTOMY;  Surgeon: Melrose Nakayama, MD;  Location: [facility];  Service: Thoracic;  Laterality: Right;  Marland Kitchen VIDEO BRONCHOSCOPY N/A 02/14/2019   Procedure: BRONCHOSCOPY WITH SEDATION;  Surgeon: Melrose Nakayama, MD;  Location: [facility] OR;  Service: Thoracic;  Laterality: N/A;    Current Medications: Current Meds  Medication Sig  . ALPRAZolam (XANAX) 0.5 MG tablet Take 1 tablet (0.5 mg total) by mouth 2 (two) times daily.  Marland Kitchen amiodarone (PACERONE) 100 MG tablet Take 1 tablet (100 mg total) by mouth daily.  Marland Kitchen  apixaban (ELIQUIS) 5 MG TABS tablet Take 1 tablet (5 mg total) by mouth 2 (two) times daily.  Marland Kitchen atorvastatin (LIPITOR) 80 MG tablet Take 80 mg by mouth daily.  . Cholecalciferol (VITAMIN D) 2000 units CAPS Take 2,000 Units by mouth daily.  . cycloSPORINE (RESTASIS) 0.05 % ophthalmic emulsion Place 1 drop into both eyes 2 (two) times daily.  . Fluticasone-Umeclidin-Vilant (TRELEGY ELLIPTA) 100-62.5-25 MCG/INH AEPB Inhale 1 puff into the lungs daily.  . furosemide (LASIX) 40 MG tablet Take 1 tablet (40 mg total) by mouth 2 (two) times daily. Take extra dose as needed for leg swellings/weight gain/shortness of breath  . HYDROcodone-acetaminophen (NORCO/VICODIN) 5-325 MG tablet Take 1 tablet by mouth every 6 (six) hours as needed for moderate pain.  Marland Kitchen ipratropium-albuterol (DUONEB) 0.5-2.5 (3) MG/3ML SOLN Take 3 mLs by nebulization every 4 (four) hours as needed (shortness of breath or wheezing).  . magnesium oxide (MAG-OX) 400 MG tablet Take 400 mg by mouth 2 (two) times daily.  . metoprolol tartrate (LOPRESSOR) 25 MG tablet Take 0.5 tablets (12.5 mg total) by mouth 2 (two) times daily. Hold SBP<95, HR <60  . Multiple Vitamin (MULTIVITAMIN WITH MINERALS) TABS tablet Take 1 tablet by mouth daily.  . potassium chloride (K-DUR) 10 MEQ tablet Take 10 mEq by mouth daily.  . prochlorperazine (COMPAZINE) 10 MG tablet TAKE 1 TABLET(10 MG) BY MOUTH EVERY 6 HOURS AS NEEDED FOR NAUSEA OR VOMITING (Patient taking differently: Take 10 mg by mouth every 6 (six) hours as needed for nausea or vomiting. )  . QUEtiapine (SEROQUEL) 100 MG tablet TAKE 1 AND 1/2 TABLETS(150 MG) BY MOUTH AT BEDTIME (Patient taking differently: Take 150 mg by mouth at bedtime. )  . rOPINIRole (REQUIP) 0.25 MG tablet Take 2 tablets (0.5 mg total) by mouth at bedtime.  . VENTOLIN HFA 108 (90 Base) MCG/ACT inhaler INHALE 2 PUFFS INTO THE LUNGS EVERY 6 HOURS AS NEEDED FOR WHEEZING OR SHORTNESS OF BREATH (Patient taking differently: Inhale 2 puffs  into the lungs every 6 (six) hours as needed for wheezing or shortness of breath. )     Allergies:   Morphine and related   Social History   Socioeconomic History  . Marital status: Married    Spouse name: Not on file  . Number of children: 3  . Years of education: 10  . Highest education level: Not on file  Occupational History  . 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 Financial resource strain: Not on file  . Food insecurity    Worry: Not on file    Inability: Not on file  . Transportation needs    Medical: Not on file    Non-medical: Not on file  Tobacco Use  . Smoking status: Former Smoker    Years: 54.00    Types: Cigarettes    Start date: 06/14/1966    Quit date: 04/02/2019    Years  since quitting: 0.3  . Smokeless tobacco: Never Used  . Tobacco comment: 1-2 packs daily. Chantix too expensive; pt states that she quit 2 weeks ago (02/04/19)   Substance and Sexual Activity  . Alcohol use: No    Alcohol/week: 0.0 standard drinks  . Drug use: No  . Sexual activity: Yes    Birth control/protection: Post-menopausal  Lifestyle  . Physical activity    Days per week: Not on file    Minutes per session: Not on file  . Stress: Not on file  Relationships  . Social Herbalist on phone: Not on file    Gets together: Not on file    Attends religious service: Not on file    Active member of club or organization: Not on file    Attends meetings of clubs or organizations: Not on file    Relationship status: Not on file  Other Topics Concern  . Not on file  Social History Narrative   Quit high school and got married in 10th grade, age 53   First husband died of colon cancer at young age   Married again 2018   Lives in own home   Daughter lives with her   Drinks coffee seldom, too much "sweet tea"     Family History:  The patient's family history includes Breast cancer (age of onset: 61) in her sister; Cancer in her cousin;  Congestive Heart Failure in her brother; Heart attack in her father and sister; Heart disease in her brother, father, maternal grandfather, mother, and sister; Hyperlipidemia in her mother; Hypertension in her son and son; Stomach cancer in an other family member; Stroke in her mother.   ROS:   Please see the history of present illness.    ROS All other systems reviewed and are negative.   PHYSICAL EXAM:   VS:  BP 138/66   Pulse 100   Ht 5' 2"  (1.575 m)   Wt 155 lb (70.3 kg)   SpO2 97%   BMI 28.35 kg/m   Physical Exam  GEN: Well nourished, well developed, in no acute distress  Neck: no JVD, carotid bruits, or masses Cardiac:RRR; no murmurs, rubs, or gallops  Respiratory:  clear to auscultation bilaterally, normal work of breathing GI: soft, nontender, nondistended, + BS Ext: +2-3 edema bilaterally without cyanosis, clubbing, Good distal pulses bilaterally Neuro:  Alert and Oriented x 3 Psych: euthymic mood, full affect  Wt Readings from Last 3 Encounters:  08/25/19 155 lb (70.3 kg)  08/21/19 157 lb 3 oz (71.3 kg)  08/11/19 155 lb 6 oz (70.5 kg)      Studies/Labs Reviewed:   EKG:  EKG is  ordered today.  The ekg ordered today demonstrates normal sinus rhythm with incomplete right bundle branch block 100 bpm  Recent Labs: 07/30/2019: ALT 33 08/18/2019: B Natriuretic Peptide 84.5 08/19/2019: TSH 6.706 08/21/2019: BUN 20; Creatinine, Ser 0.58; Hemoglobin 9.3; Magnesium 2.1; Platelets 205; Potassium 3.6; Sodium 138   Lipid Panel    Component Value Date/Time   CHOL 163 11/07/2016 1526   TRIG 202 (H) 11/07/2016 1526   HDL 38 (L) 11/07/2016 1526   CHOLHDL 4.3 11/07/2016 1526   VLDL 40 (H) 11/07/2016 1526   [place] 85 11/07/2016 1526    Additional studies/ records that were reviewed today include:  TEE 08/20/2019  IMPRESSIONS      1. Left ventricular ejection fraction, by visual estimation, is 45 to 50%. The left ventricle has mildly decreased function. Normal  left  ventricular size. There is no left ventricular hypertrophy.  2. EF assessment hindered by tachycardia.  3. Global right ventricle has normal systolic function.The right ventricular size is normal. No increase in right ventricular wall thickness.  4. Left atrial size was normal.  5. No LAA thrombus.  6. Right atrial size was normal.  7. The mitral valve is normal in structure. Trace mitral valve regurgitation. No evidence of mitral stenosis.  8. The tricuspid valve is normal in structure. Tricuspid valve regurgitation was not visualized by color flow Doppler.  9. The aortic valve is normal in structure. Aortic valve regurgitation was not visualized by color flow Doppler. Mild aortic valve sclerosis without stenosis. 10. The pulmonic valve was normal in structure. Pulmonic valve regurgitation is not visualized by color flow Doppler. 11. The inferior vena cava is normal in size with greater than 50% respiratory variability, suggesting right atrial pressure of 3 mmHg.   FINDINGS  Left Ventricle: Left ventricular ejection fraction, by visual estimation, is 45 to 50%. The left ventricle has mildly decreased function. There is no left ventricular hypertrophy. Normal left ventricular size. EF assessment hindered by tachycardia.   Right Ventricle: The right ventricular size is normal. No increase in right ventricular wall thickness. Global RV systolic function is has normal systolic function.   Left Atrium: Left atrial size was normal in size. No LAA thrombus.     Right Atrium: Right atrial size was normal in size   Pericardium: There is no evidence of pericardial effusion.   Mitral Valve: The mitral valve is normal in structure. No evidence of mitral valve stenosis by observation. Trace mitral valve regurgitation.   Tricuspid Valve: The tricuspid valve is normal in structure. Tricuspid valve regurgitation was not visualized by color flow Doppler.   Aortic Valve: The aortic valve is normal in  structure. Aortic valve regurgitation was not visualized by color flow Doppler. Mild aortic valve sclerosis is present, with no evidence of aortic valve stenosis.   Pulmonic Valve: The pulmonic valve was normal in structure. Pulmonic valve regurgitation is not visualized by color flow Doppler.   Aorta: The aortic root, ascending aorta and aortic arch are all structurally normal, with no evidence of dilitation or obstruction.   Venous: The inferior vena cava is normal in size with greater than 50% respiratory variability, suggesting right atrial pressure of 3 mmHg.   Shunts: No ventricular septal defect is seen or detected. There is no evidence of an atrial septal defect. No atrial level shunt detected by color flow Doppler.     Candee Furbish MD Electronically signed by Candee Furbish MD Signature Date/Time: 08/20/2019/12:44:22 PM    2D echo 7/12/2020IMPRESSIONS      1. The left ventricle has normal systolic function with an ejection fraction of 60-65%. The cavity size was normal. Indeterminate diastolic filling due to E-A fusion.  2. The right ventricle has normal systolic function. The cavity was normal. There is no increase in right ventricular wall thickness. Right ventricular systolic pressure could not be assessed.  3. Left atrial size was mildly dilated.  4. The aortic valve is abnormal. Moderate calcification of the aortic valve. No stenosis of the aortic valve.  5. The inferior vena cava was dilated in size with <50% respiratory variability.        ASSESSMENT:    1. Atrial flutter, unspecified type ([place])   2. Acute on chronic diastolic CHF (congestive heart failure) ([place])   3. Essential hypertension   4. Coronary artery  disease involving native coronary artery of native heart without angina pectoris   5. Tobacco abuse   6. History of lung cancer      PLAN:  In order of problems listed above: Atrial flutter status post ablation 2018 with recurrence and underwent TEE guided  cardioversion 08/18/2019.  Restarted on amiodarone and Toprol also on Eliquis for at least a month.  Patient's heart rate is normal sinus rhythm at 100 bpm today.  Will increase metoprolol to 25 mg twice daily  Acute on chronic diastolic CHF patient has 3-8+ edema on Lasix 40 mg twice daily will increase Lasix to 80 mg twice daily for 3 days then back to 40 mg twice daily double up on potassium for 3 days and back to her current dose.  Follow-up with Dr. Lovena Le or APP in the next week or 2.  Should have been met and BNP at follow-up.  Essential hypertension blood pressure stable today for increasing medication  CAD history of stent to the circumflex 2012 no angina  Tobacco abuse continues to smoke  History of lung CA status post surgery and significant COPD on oxygen continues to smoke followed by oncology and palliative care     Medication Adjustments/Labs and Tests Ordered: Current medicines are reviewed at length with the patient today.  Concerns regarding medicines are outlined above.  Medication changes, Labs and Tests ordered today are listed in the Patient Instructions below. There are no Patient Instructions on file for this visit.   Sumner Boast, PA-C  08/25/2019 9:47 AM    [facility] [address], [place], [place]  [address] Phone: [phone]; Fax: [phone]

## 2019-08-26 ENCOUNTER — Ambulatory Visit (HOSPITAL_COMMUNITY)
Admission: RE | Admit: 2019-08-26 | Discharge: 2019-08-26 | Disposition: A | Discharge status: Home / Self Care | Source: Ambulatory Visit | Attending: Radiation Oncology | Admitting: Radiation Oncology

## 2019-08-26 ENCOUNTER — Other Ambulatory Visit: Payer: Self-pay | Admitting: Family Medicine

## 2019-08-26 DIAGNOSIS — I2510 Atherosclerotic heart disease of native coronary artery without angina pectoris: Secondary | ICD-10-CM | POA: Diagnosis not present

## 2019-08-26 DIAGNOSIS — G92 Toxic encephalopathy: Secondary | ICD-10-CM | POA: Diagnosis not present

## 2019-08-26 DIAGNOSIS — I4892 Unspecified atrial flutter: Secondary | ICD-10-CM | POA: Diagnosis present

## 2019-08-26 DIAGNOSIS — F419 Anxiety disorder, unspecified: Secondary | ICD-10-CM | POA: Diagnosis not present

## 2019-08-26 DIAGNOSIS — J440 Chronic obstructive pulmonary disease with acute lower respiratory infection: Secondary | ICD-10-CM | POA: Diagnosis present

## 2019-08-26 DIAGNOSIS — I471 Supraventricular tachycardia: Secondary | ICD-10-CM | POA: Diagnosis present

## 2019-08-26 DIAGNOSIS — R0689 Other abnormalities of breathing: Secondary | ICD-10-CM | POA: Diagnosis not present

## 2019-08-26 DIAGNOSIS — R0602 Shortness of breath: Secondary | ICD-10-CM | POA: Diagnosis not present

## 2019-08-26 DIAGNOSIS — Z515 Encounter for palliative care: Secondary | ICD-10-CM | POA: Diagnosis not present

## 2019-08-26 DIAGNOSIS — C3490 Malignant neoplasm of unspecified part of unspecified bronchus or lung: Secondary | ICD-10-CM | POA: Diagnosis not present

## 2019-08-26 DIAGNOSIS — R6521 Severe sepsis with septic shock: Secondary | ICD-10-CM | POA: Diagnosis not present

## 2019-08-26 DIAGNOSIS — J9601 Acute respiratory failure with hypoxia: Secondary | ICD-10-CM | POA: Diagnosis not present

## 2019-08-26 DIAGNOSIS — I5032 Chronic diastolic (congestive) heart failure: Secondary | ICD-10-CM | POA: Diagnosis not present

## 2019-08-26 DIAGNOSIS — R0902 Hypoxemia: Secondary | ICD-10-CM | POA: Diagnosis not present

## 2019-08-26 DIAGNOSIS — M810 Age-related osteoporosis without current pathological fracture: Secondary | ICD-10-CM | POA: Diagnosis present

## 2019-08-26 DIAGNOSIS — E876 Hypokalemia: Secondary | ICD-10-CM | POA: Diagnosis present

## 2019-08-26 DIAGNOSIS — R404 Transient alteration of awareness: Secondary | ICD-10-CM | POA: Diagnosis not present

## 2019-08-26 DIAGNOSIS — J189 Pneumonia, unspecified organism: Secondary | ICD-10-CM | POA: Diagnosis not present

## 2019-08-26 DIAGNOSIS — J90 Pleural effusion, not elsewhere classified: Secondary | ICD-10-CM | POA: Diagnosis not present

## 2019-08-26 DIAGNOSIS — C3491 Malignant neoplasm of unspecified part of right bronchus or lung: Secondary | ICD-10-CM | POA: Insufficient documentation

## 2019-08-26 DIAGNOSIS — Z79899 Other long term (current) drug therapy: Secondary | ICD-10-CM | POA: Diagnosis not present

## 2019-08-26 DIAGNOSIS — E7800 Pure hypercholesterolemia, unspecified: Secondary | ICD-10-CM | POA: Diagnosis present

## 2019-08-26 DIAGNOSIS — M4690 Unspecified inflammatory spondylopathy, site unspecified: Secondary | ICD-10-CM | POA: Diagnosis present

## 2019-08-26 DIAGNOSIS — Z7901 Long term (current) use of anticoagulants: Secondary | ICD-10-CM | POA: Diagnosis not present

## 2019-08-26 DIAGNOSIS — Z7189 Other specified counseling: Secondary | ICD-10-CM | POA: Diagnosis not present

## 2019-08-26 DIAGNOSIS — Z20828 Contact with and (suspected) exposure to other viral communicable diseases: Secondary | ICD-10-CM | POA: Diagnosis not present

## 2019-08-26 DIAGNOSIS — K219 Gastro-esophageal reflux disease without esophagitis: Secondary | ICD-10-CM | POA: Diagnosis present

## 2019-08-26 DIAGNOSIS — A419 Sepsis, unspecified organism: Secondary | ICD-10-CM | POA: Diagnosis not present

## 2019-08-26 DIAGNOSIS — F329 Major depressive disorder, single episode, unspecified: Secondary | ICD-10-CM | POA: Diagnosis present

## 2019-08-26 DIAGNOSIS — Z66 Do not resuscitate: Secondary | ICD-10-CM | POA: Diagnosis not present

## 2019-08-26 DIAGNOSIS — R000 Tachycardia, unspecified: Secondary | ICD-10-CM | POA: Diagnosis not present

## 2019-08-26 DIAGNOSIS — Y95 Nosocomial condition: Secondary | ICD-10-CM | POA: Diagnosis present

## 2019-08-26 MED ORDER — GADOBUTROL 1 MMOL/ML IV SOLN
7.0000 mL | Freq: Once | INTRAVENOUS | Status: AC | PRN
  Administered 2019-08-26: 07:00:00 7 mL via INTRAVENOUS

## 2019-08-26 NOTE — Telephone Encounter (Signed)
Requested medication (s) are due for refill today: no  Requested medication (s) are on the active medication list: yes  Future visit scheduled: no  Notes to clinic:  Refill cannot be delegated    Requested Prescriptions  Pending Prescriptions Disp Refills   HYDROcodone-acetaminophen (NORCO/VICODIN) 5-325 MG tablet 30 tablet     Sig: Take 1 tablet by mouth every 6 (six) hours as needed for moderate pain.     Not Delegated - Analgesics:  Opioid Agonist Combinations Failed - 08/26/2019  8:21 AM      Failed - This refill cannot be delegated      Failed - Urine Drug Screen completed in last 360 days.      Passed - Valid encounter within last 6 months    Recent Outpatient Visits          1 week ago Constipation, unspecified constipation type   Therapist, [facility] at [facility] [place], Malka So, MD   1 month ago Hypokalemia   Therapist, [facility] at [facility] [place], Malka So, MD   1 month ago Insomnia, unspecified type   Therapist, [facility] at [facility] [place], Malka So, MD   5 months ago Tremor, unspecified   Therapist, [facility] at [facility] [place], Malka So, MD   5 months ago Other fatigue   Therapist, [facility] at [facility] [place], Malka So, MD      Future Appointments            In 1 week [place], PA-C [place], [facility]

## 2019-08-26 NOTE — Telephone Encounter (Signed)
Copied from [place] [phone]. Topic: Quick Communication - Rx Refill/Question >> Aug 26, 2019  8:17 AM Leward Quan A wrote: Medication: HYDROcodone-acetaminophen (NORCO/VICODIN) 5-325 MG tablet   Has the patient contacted their pharmacy? Yes.   (Agent: If no, request that the patient contact the pharmacy for the refill.) (Agent: If yes, when and what did the pharmacy advise?)  Preferred Pharmacy (with phone number or street name): [facility], [place] [facility] [address] [phone] (Phone) [phone] (Fax)    Agent: Please be advised that RX refills may take up to 3 business days. We ask that you follow-up with your pharmacy.

## 2019-08-27 ENCOUNTER — Telehealth: Payer: Self-pay | Admitting: Family Medicine

## 2019-08-27 ENCOUNTER — Telehealth: Payer: Self-pay | Admitting: *Deleted

## 2019-08-27 NOTE — Telephone Encounter (Signed)
Left voice message for Tammy to return call to office for verbal orders.

## 2019-08-27 NOTE — Telephone Encounter (Signed)
Please advise 

## 2019-08-27 NOTE — Telephone Encounter (Signed)
FYI sent to Dr. Martinique for review. Copied from [place] [id number]. Topic: General - Other >> Aug 26, 2019  8:19 AM Leward Quan A wrote: Reason for CRM: Patient husband called to say that she just got out the hospital a few days ago for a heart issue. Also say that the patient will be having her brain MRI today to check for cancer. Just wanted to inform Dr Martinique.

## 2019-08-27 NOTE — Telephone Encounter (Signed)
It is ok to give verbal authorization for requested services. Thanks, BJ

## 2019-08-27 NOTE — Telephone Encounter (Signed)
Home Health Verbal Orders - Caller/Agency: Tammy, [facility] Number: [phone] Requesting OT/PT/Skilled Nursing/Social Work/Speech Therapy: Pt's husband called to request pallative home care with hosp[facility] [place]. Pt is declining quickly, Husband is requesting that hospice attending service the care.  Pt would be admitting her for heart failure

## 2019-08-29 ENCOUNTER — Emergency Department (HOSPITAL_COMMUNITY)

## 2019-08-29 ENCOUNTER — Inpatient Hospital Stay (HOSPITAL_COMMUNITY)
Admission: EM | Admit: 2019-08-29 | Discharge: 2019-09-02 | DRG: 871 | Disposition: E | Attending: Internal Medicine | Admitting: Internal Medicine

## 2019-08-29 ENCOUNTER — Other Ambulatory Visit: Payer: Self-pay

## 2019-08-29 DIAGNOSIS — Z66 Do not resuscitate: Secondary | ICD-10-CM | POA: Diagnosis present

## 2019-08-29 DIAGNOSIS — R579 Shock, unspecified: Secondary | ICD-10-CM | POA: Diagnosis present

## 2019-08-29 DIAGNOSIS — R6521 Severe sepsis with septic shock: Secondary | ICD-10-CM | POA: Diagnosis present

## 2019-08-29 DIAGNOSIS — Z800 Family history of malignant neoplasm of digestive organs: Secondary | ICD-10-CM

## 2019-08-29 DIAGNOSIS — Z9842 Cataract extraction status, left eye: Secondary | ICD-10-CM

## 2019-08-29 DIAGNOSIS — Z885 Allergy status to narcotic agent status: Secondary | ICD-10-CM

## 2019-08-29 DIAGNOSIS — Z7901 Long term (current) use of anticoagulants: Secondary | ICD-10-CM | POA: Diagnosis not present

## 2019-08-29 DIAGNOSIS — Z515 Encounter for palliative care: Secondary | ICD-10-CM | POA: Diagnosis present

## 2019-08-29 DIAGNOSIS — K219 Gastro-esophageal reflux disease without esophagitis: Secondary | ICD-10-CM | POA: Diagnosis present

## 2019-08-29 DIAGNOSIS — J9601 Acute respiratory failure with hypoxia: Secondary | ICD-10-CM | POA: Diagnosis present

## 2019-08-29 DIAGNOSIS — R0602 Shortness of breath: Secondary | ICD-10-CM | POA: Diagnosis not present

## 2019-08-29 DIAGNOSIS — E7800 Pure hypercholesterolemia, unspecified: Secondary | ICD-10-CM | POA: Diagnosis present

## 2019-08-29 DIAGNOSIS — Z807 Family history of other malignant neoplasms of lymphoid, hematopoietic and related tissues: Secondary | ICD-10-CM

## 2019-08-29 DIAGNOSIS — I2510 Atherosclerotic heart disease of native coronary artery without angina pectoris: Secondary | ICD-10-CM | POA: Diagnosis present

## 2019-08-29 DIAGNOSIS — J189 Pneumonia, unspecified organism: Secondary | ICD-10-CM | POA: Diagnosis present

## 2019-08-29 DIAGNOSIS — I471 Supraventricular tachycardia, unspecified: Secondary | ICD-10-CM | POA: Diagnosis present

## 2019-08-29 DIAGNOSIS — M4690 Unspecified inflammatory spondylopathy, site unspecified: Secondary | ICD-10-CM | POA: Diagnosis present

## 2019-08-29 DIAGNOSIS — R0689 Other abnormalities of breathing: Secondary | ICD-10-CM | POA: Diagnosis not present

## 2019-08-29 DIAGNOSIS — A419 Sepsis, unspecified organism: Secondary | ICD-10-CM | POA: Diagnosis present

## 2019-08-29 DIAGNOSIS — J90 Pleural effusion, not elsewhere classified: Secondary | ICD-10-CM | POA: Diagnosis not present

## 2019-08-29 DIAGNOSIS — Z9049 Acquired absence of other specified parts of digestive tract: Secondary | ICD-10-CM

## 2019-08-29 DIAGNOSIS — J440 Chronic obstructive pulmonary disease with acute lower respiratory infection: Secondary | ICD-10-CM | POA: Diagnosis present

## 2019-08-29 DIAGNOSIS — G92 Toxic encephalopathy: Secondary | ICD-10-CM | POA: Diagnosis present

## 2019-08-29 DIAGNOSIS — C3491 Malignant neoplasm of unspecified part of right bronchus or lung: Secondary | ICD-10-CM | POA: Diagnosis present

## 2019-08-29 DIAGNOSIS — Z87891 Personal history of nicotine dependence: Secondary | ICD-10-CM

## 2019-08-29 DIAGNOSIS — I4892 Unspecified atrial flutter: Secondary | ICD-10-CM | POA: Diagnosis present

## 2019-08-29 DIAGNOSIS — F419 Anxiety disorder, unspecified: Secondary | ICD-10-CM | POA: Diagnosis present

## 2019-08-29 DIAGNOSIS — Y95 Nosocomial condition: Secondary | ICD-10-CM | POA: Diagnosis present

## 2019-08-29 DIAGNOSIS — F329 Major depressive disorder, single episode, unspecified: Secondary | ICD-10-CM | POA: Diagnosis present

## 2019-08-29 DIAGNOSIS — J438 Other emphysema: Secondary | ICD-10-CM | POA: Diagnosis present

## 2019-08-29 DIAGNOSIS — Z9841 Cataract extraction status, right eye: Secondary | ICD-10-CM

## 2019-08-29 DIAGNOSIS — Z8349 Family history of other endocrine, nutritional and metabolic diseases: Secondary | ICD-10-CM

## 2019-08-29 DIAGNOSIS — Z85828 Personal history of other malignant neoplasm of skin: Secondary | ICD-10-CM

## 2019-08-29 DIAGNOSIS — Z79899 Other long term (current) drug therapy: Secondary | ICD-10-CM

## 2019-08-29 DIAGNOSIS — M810 Age-related osteoporosis without current pathological fracture: Secondary | ICD-10-CM | POA: Diagnosis present

## 2019-08-29 DIAGNOSIS — I5032 Chronic diastolic (congestive) heart failure: Secondary | ICD-10-CM | POA: Diagnosis present

## 2019-08-29 DIAGNOSIS — Z803 Family history of malignant neoplasm of breast: Secondary | ICD-10-CM

## 2019-08-29 DIAGNOSIS — Z955 Presence of coronary angioplasty implant and graft: Secondary | ICD-10-CM

## 2019-08-29 DIAGNOSIS — E876 Hypokalemia: Secondary | ICD-10-CM | POA: Diagnosis present

## 2019-08-29 DIAGNOSIS — R000 Tachycardia, unspecified: Secondary | ICD-10-CM | POA: Diagnosis not present

## 2019-08-29 DIAGNOSIS — Z7189 Other specified counseling: Secondary | ICD-10-CM | POA: Diagnosis not present

## 2019-08-29 DIAGNOSIS — Z20828 Contact with and (suspected) exposure to other viral communicable diseases: Secondary | ICD-10-CM | POA: Diagnosis present

## 2019-08-29 DIAGNOSIS — R0902 Hypoxemia: Secondary | ICD-10-CM | POA: Diagnosis not present

## 2019-08-29 DIAGNOSIS — C3490 Malignant neoplasm of unspecified part of unspecified bronchus or lung: Secondary | ICD-10-CM | POA: Diagnosis present

## 2019-08-29 DIAGNOSIS — R404 Transient alteration of awareness: Secondary | ICD-10-CM | POA: Diagnosis not present

## 2019-08-29 DIAGNOSIS — Z8249 Family history of ischemic heart disease and other diseases of the circulatory system: Secondary | ICD-10-CM

## 2019-08-29 LAB — COMPREHENSIVE METABOLIC PANEL
ALT: 24 U/L (ref 0–44)
AST: 27 U/L (ref 15–41)
Albumin: 3.4 g/dL — ABNORMAL LOW (ref 3.5–5.0)
Alkaline Phosphatase: 98 U/L (ref 38–126)
Anion gap: 10 (ref 5–15)
BUN: 26 mg/dL — ABNORMAL HIGH (ref 8–23)
CO2: 36 mmol/L — ABNORMAL HIGH (ref 22–32)
Calcium: 8.8 mg/dL — ABNORMAL LOW (ref 8.9–10.3)
Chloride: 90 mmol/L — ABNORMAL LOW (ref 98–111)
Creatinine, Ser: 0.81 mg/dL (ref 0.44–1.00)
GFR calc Af Amer: >60 mL/min (ref >60)
GFR calc non Af Amer: >60 mL/min (ref >60)
Glucose, Bld: 142 mg/dL — ABNORMAL HIGH (ref 70–99)
Potassium: 2.9 mmol/L — ABNORMAL LOW (ref 3.5–5.1)
Sodium: 136 mmol/L (ref 135–145)
Total Bilirubin: 0.6 mg/dL (ref 0.3–1.2)
Total Protein: 6.2 g/dL — ABNORMAL LOW (ref 6.5–8.1)

## 2019-08-29 LAB — CBC WITH DIFFERENTIAL/PLATELET
Abs Immature Granulocytes: 0.08 10*3/uL — ABNORMAL HIGH (ref 0.00–0.07)
Basophils Absolute: 0 10*3/uL (ref 0.0–0.1)
Basophils Relative: 0 %
Eosinophils Absolute: 0 10*3/uL (ref 0.0–0.5)
Eosinophils Relative: 0 %
HCT: 33.1 % — ABNORMAL LOW (ref 36.0–46.0)
Hemoglobin: 10.7 g/dL — ABNORMAL LOW (ref 12.0–15.0)
Immature Granulocytes: 1 %
Lymphocytes Relative: 4 %
Lymphs Abs: 0.4 10*3/uL — ABNORMAL LOW (ref 0.7–4.0)
MCH: 32.7 pg (ref 26.0–34.0)
MCHC: 32.3 g/dL (ref 30.0–36.0)
MCV: 101.2 fL — ABNORMAL HIGH (ref 80.0–100.0)
Monocytes Absolute: 0.5 10*3/uL (ref 0.1–1.0)
Monocytes Relative: 4 %
Neutro Abs: 10.1 10*3/uL — ABNORMAL HIGH (ref 1.7–7.7)
Neutrophils Relative %: 91 %
Platelets: 237 10*3/uL (ref 150–400)
RBC: 3.27 MIL/uL — ABNORMAL LOW (ref 3.87–5.11)
RDW: 14.6 % (ref 11.5–15.5)
WBC: 11.2 10*3/uL — ABNORMAL HIGH (ref 4.0–10.5)
nRBC: 0 % (ref 0.0–0.2)

## 2019-08-29 LAB — APTT: aPTT: 30 seconds (ref 24–36)

## 2019-08-29 LAB — LACTIC ACID, PLASMA: Lactic Acid, Venous: 1.4 mmol/L (ref 0.5–1.9)

## 2019-08-29 LAB — PROTIME-INR
INR: 1.3 — ABNORMAL HIGH (ref 0.8–1.2)
Prothrombin Time: 16.5 seconds — ABNORMAL HIGH (ref 11.4–15.2)

## 2019-08-29 LAB — SARS CORONAVIRUS 2 BY RT PCR (HOSPITAL ORDER, PERFORMED IN ~~LOC~~ HOSPITAL LAB): SARS Coronavirus 2: NEGATIVE

## 2019-08-29 MED ORDER — SODIUM CHLORIDE 0.9 % IV SOLN
2.0000 g | INTRAVENOUS | Status: DC
  Administered 2019-08-29: 17:00:00 2 g via INTRAVENOUS
  Filled 2019-08-29: qty 20
  Filled 2019-08-29: qty 2

## 2019-08-29 MED ORDER — VANCOMYCIN HCL IN DEXTROSE 1-5 GM/200ML-% IV SOLN: 1000.0000 mg | Freq: Once | INTRAVENOUS | Status: DC

## 2019-08-29 MED ORDER — LORAZEPAM 2 MG/ML IJ SOLN
0.5000 mg | INTRAMUSCULAR | Status: DC | PRN
  Administered 2019-08-29 – 2019-08-30 (×4): 0.5 mg via INTRAVENOUS
  Filled 2019-08-29 (×4): qty 1

## 2019-08-29 MED ORDER — POTASSIUM CHLORIDE 10 MEQ/100ML IV SOLN
10.0000 meq | INTRAVENOUS | Status: AC
  Administered 2019-08-29 (×2): 10 meq via INTRAVENOUS
  Filled 2019-08-29: qty 100

## 2019-08-29 MED ORDER — ACETAMINOPHEN 650 MG RE SUPP
650.0000 mg | Freq: Once | RECTAL | Status: AC
  Administered 2019-08-29: 650 mg via RECTAL
  Filled 2019-08-29: qty 1

## 2019-08-29 MED ORDER — SODIUM CHLORIDE 0.9 % IV SOLN
2.0000 g | Freq: Once | INTRAVENOUS | Status: AC
  Administered 2019-08-29: 2 g via INTRAVENOUS
  Filled 2019-08-29: qty 2

## 2019-08-29 MED ORDER — MORPHINE SULFATE (PF) 2 MG/ML IV SOLN
1.0000 mg | INTRAVENOUS | Status: DC | PRN
  Administered 2019-08-29 (×5): 2 mg via INTRAVENOUS
  Filled 2019-08-29 (×5): qty 1

## 2019-08-29 MED ORDER — ACETAMINOPHEN 650 MG RE SUPP
650.0000 mg | RECTAL | Status: DC | PRN
  Filled 2019-08-29: qty 1

## 2019-08-29 MED ORDER — SODIUM CHLORIDE 0.9 % IV SOLN
1000.0000 mL | INTRAVENOUS | Status: DC
  Administered 2019-08-30 (×2): 1000 mL via INTRAVENOUS

## 2019-08-29 MED ORDER — VANCOMYCIN HCL 10 G IV SOLR
1500.0000 mg | Freq: Once | INTRAVENOUS | Status: AC
  Administered 2019-08-29: 1500 mg via INTRAVENOUS
  Filled 2019-08-29: qty 1500

## 2019-08-29 MED ORDER — METRONIDAZOLE IN NACL 5-0.79 MG/ML-% IV SOLN
500.0000 mg | Freq: Once | INTRAVENOUS | Status: AC
  Administered 2019-08-29: 500 mg via INTRAVENOUS
  Filled 2019-08-29: qty 100

## 2019-08-29 MED ORDER — POTASSIUM CHLORIDE 10 MEQ/100ML IV SOLN
INTRAVENOUS | Status: AC
  Administered 2019-08-29: 10 meq via INTRAVENOUS
  Filled 2019-08-29: qty 100

## 2019-08-29 MED ORDER — MORPHINE 100MG IN NS 100ML (1MG/ML) PREMIX INFUSION
4.0000 mg/h | INTRAVENOUS | Status: DC
  Administered 2019-08-29: 22:00:00 2 mg/h via INTRAVENOUS
  Filled 2019-08-29: qty 100

## 2019-08-29 MED ORDER — SODIUM CHLORIDE 0.9 % IV SOLN
500.0000 mg | INTRAVENOUS | Status: DC
  Administered 2019-08-29: 500 mg via INTRAVENOUS
  Filled 2019-08-29 (×2): qty 500

## 2019-08-29 MED ORDER — SODIUM CHLORIDE 0.9 % IV BOLUS (SEPSIS)
1000.0000 mL | Freq: Once | INTRAVENOUS | Status: AC
  Administered 2019-08-29: 1000 mL via INTRAVENOUS

## 2019-08-29 MED ORDER — SODIUM CHLORIDE 0.9 % IV SOLN
1000.0000 mL | INTRAVENOUS | Status: DC
  Administered 2019-08-29: 13:00:00 1000 mL via INTRAVENOUS

## 2019-08-29 NOTE — ED Notes (Signed)
Hospice RN- Yetta Glassman- [phone]

## 2019-08-29 NOTE — ED Notes (Signed)
Pt placed on 6L O2 by nasal cannula

## 2019-08-29 NOTE — Progress Notes (Signed)
Notified bedside nurse of need to order fluid bolus.  Beside RN called to make aware of low bp and pt needing full fluid volume of 2178ml based on pt weight. Bedside RN stated pt came in a hospice patient and the admitting MD was putting in comfort care orders and would ask for sepsis order to be DC.

## 2019-08-29 NOTE — ED Notes (Signed)
Family at bedside. 

## 2019-08-29 NOTE — Progress Notes (Signed)
A consult was received from an ED physician for cefepime and vancomycin per pharmacy dosing.  The patient's profile has been reviewed for ht/wt/allergies/indication/available labs.    A one time order has been placed for cefepime 2 g IV once + vancomycin 1500 mg IV once.    Further antibiotics/pharmacy consults should be ordered by admitting physician if indicated.                       Thank you, Lenis Noon, PharmD 08/24/2019  9:42 AM

## 2019-08-29 NOTE — H&P (Signed)
History and Physical    Tonya Bradley HCW:[id number] DOB: 1951/05/22 DOA: 08/27/2019  PCP: Martinique, [name], MD  Patient coming from: Home  Chief Complaint: Confusion  HPI: Tonya Bradley is a 68 y.o. female with medical history significant of small cell lung cancer, copd, cad presented to ED with increased lethargy and decreased responsiveness. Of note, pt is lethargic at this time, thus history cannot be obtained from her. Per patient's husband, pt has been more active prior to ED visit. Over past 2 weeks, husband has noted gradually increasing confusion. Symptoms progressed and on day of ED visit, pt was noted to be lethargic and hypoxic. Pt was subsequently brought to ED for further work up. Of note, pt is active with hospice services.  ED Course: In the ED, pt was found to have a temp of 102.92F, HR of 130's, RR of nearly 30, and sbp in the 70's. CXR was notable for findings of PNA. Pt was started on empiric abx. During this time, code status was addressed with family and patient's wishes were noted to be comfort only with no pressors, mechanical ventillation, CPR, or cardioversion. Family had requested continuing a trial of antibiotic, however. Hospitalist since consulted for consideration for medical admission  Review of Systems:  Review of Systems  Unable to perform ROS: Critical illness    Past Medical History:  Diagnosis Date  . Acute on chronic diastolic (congestive) heart failure ([place]) 06/12/2019  . AKI (acute kidney injury) ([place])   . Anemia   . Anxiety   . Arthritis    "back"  . Atrial flutter ([place]) 03/13/2016  . CAD (coronary artery disease)   . Cancer ([place])    skin cancer  . Cataract   . Colon polyp   . Complication of anesthesia    BP dropped during esophagus diltation in 2017-admitted for 3 days.   Marland Kitchen COPD (chronic obstructive pulmonary disease) ([place])   . Depression   . Dysrhythmia   . Emphysema of lung ([place])   . GERD (gastroesophageal reflux disease)   .  Hypercholesterolemia   . Lung cancer ([place]) dx'd 01/2019  . Osteoporosis   . Oxygen deficiency     Past Surgical History:  Procedure Laterality Date  . A-FLUTTER ABLATION N/A 05/19/2017   Procedure: A-Flutter Ablation;  Surgeon: Evans Lance, MD;  Location: [facility];  Service: Cardiovascular;  Laterality: N/A;  . APPENDECTOMY    . BASAL CELL CARCINOMA EXCISION    . CARDIAC CATHETERIZATION    . CARDIOVERSION N/A 08/20/2019   Procedure: CARDIOVERSION;  Surgeon: Jerline Pain, MD;  Location: [facility] ENDOSCOPY;  Service: Cardiovascular;  Laterality: N/A;  . CATARACT EXTRACTION Bilateral   . CHOLECYSTECTOMY    . COLONOSCOPY N/A 06/23/2014   Dr. Rourk:Rectal and colonic polyps-removed, Pancolonic diverticulosis. lymphocytic colitis  . CORONARY ANGIOPLASTY WITH STENT PLACEMENT    . ESOPHAGEAL DILATION N/A 03/13/2016   Procedure: ESOPHAGEAL DILATION;  Surgeon: Daneil Dolin, MD;  [facility];  Service: Endoscopy;  Laterality: N/A;  . ESOPHAGOGASTRODUODENOSCOPY N/A 03/13/2016   Procedure: ESOPHAGOGASTRODUODENOSCOPY (EGD);  Surgeon: Daneil Dolin, MD;  [facility];  Service: Endoscopy;  Laterality: N/A;  0930  . EYE SURGERY     cataract removed, bilateral  . IR THORACENTESIS ASP PLEURAL SPACE W/IMG GUIDE  02/15/2019  . LUMBAR LAMINECTOMY/DECOMPRESSION MICRODISCECTOMY N/A 01/19/2018   Procedure: L4-5 DECOMPRESSION;  Surgeon: Marybelle Killings, MD;  Location: [facility];  Service: Orthopedics;  Laterality: N/A;  .  TEE WITHOUT CARDIOVERSION N/A 08/20/2019   Procedure: TRANSESOPHAGEAL ECHOCARDIOGRAM (TEE);  Surgeon: Jerline Pain, MD;  Location: [facility] ENDOSCOPY;  Service: Cardiovascular;  Laterality: N/A;  . VIDEO ASSISTED THORACOSCOPY (VATS)/ LOBECTOMY Right 02/04/2019   Procedure: VIDEO ASSISTED THORACOSCOPY (VATS)/RIGHT UPPER AND MIDDLE LOBECTOMY;  Surgeon: Melrose Nakayama, MD;  Location: [facility];  Service: Thoracic;  Laterality: Right;  Marland Kitchen VIDEO BRONCHOSCOPY N/A 02/14/2019    Procedure: BRONCHOSCOPY WITH SEDATION;  Surgeon: Melrose Nakayama, MD;  Location: [facility];  Service: Thoracic;  Laterality: N/A;     reports that she quit smoking about 4 months ago. Her smoking use included cigarettes. She started smoking about 53 years ago. She quit after 54.00 years of use. She has never used smokeless tobacco. She reports that she does not drink alcohol or use drugs.  Allergies  Allergen Reactions  . Morphine And Related Other (See Comments)    hallucinations    Family History  Problem Relation Age of Onset  . Breast cancer Sister 37  . Heart disease Sister   . Heart attack Father   . Heart disease Father   . Stroke Mother   . Heart disease Mother   . Hyperlipidemia Mother   . Heart attack Sister   . Heart disease Brother        ?heart failure  . Congestive Heart Failure Brother   . Hypertension Son   . Heart disease Maternal Grandfather   . Cancer Cousin        lymphoma  . Hypertension Son   . Stomach cancer Other   . Colon cancer Neg Hx   . Pancreatic cancer Neg Hx     Prior to Admission medications   Medication Sig Start Date End Date Taking? Authorizing Provider  ALPRAZolam Duanne Moron) 0.5 MG tablet Take 1 tablet (0.5 mg total) by mouth 2 (two) times daily. 07/09/19  Yes Martinique, [name], MD  amiodarone (PACERONE) 100 MG tablet Take 1 tablet (100 mg total) by mouth daily. 08/22/19  Yes Guilford Shi, MD  apixaban (ELIQUIS) 5 MG TABS tablet Take 1 tablet (5 mg total) by mouth 2 (two) times daily. 08/21/19 09/20/19 Yes Guilford Shi, MD  atorvastatin (LIPITOR) 80 MG tablet Take 80 mg by mouth daily.   Yes [provider]  Cholecalciferol (VITAMIN D) 2000 units CAPS Take 2,000 Units by mouth daily.   Yes [provider]  cycloSPORINE (RESTASIS) 0.05 % ophthalmic emulsion Place 1 drop into both eyes 2 (two) times daily.   Yes [provider]  furosemide (LASIX) 40 MG tablet Take 1 tablet (40 mg total) by mouth 2 (two) times  daily. Take extra dose as needed for leg swellings/weight gain/shortness of breath 08/25/19 09/24/19 Yes Imogene Burn, PA-C  ipratropium-albuterol (DUONEB) 0.5-2.5 (3) MG/3ML SOLN Take 3 mLs by nebulization every 4 (four) hours as needed (shortness of breath or wheezing). 06/19/19  Yes Donne Hazel, MD  magnesium oxide (MAG-OX) 400 MG tablet Take 400 mg by mouth 2 (two) times daily.   Yes [provider]  metoprolol tartrate (LOPRESSOR) 25 MG tablet Take 1 tablet (25 mg total) by mouth 2 (two) times daily. Hold SBP<95, HR <60 08/25/19  Yes Imogene Burn, PA-C  Multiple Vitamin (MULTIVITAMIN WITH MINERALS) TABS tablet Take 1 tablet by mouth daily.   Yes [provider]  potassium chloride (K-DUR) 10 MEQ tablet Take 10 mEq by mouth daily.   Yes [provider]  prochlorperazine (COMPAZINE) 10 MG tablet TAKE 1  TABLET(10 MG) BY MOUTH EVERY 6 HOURS AS NEEDED FOR NAUSEA OR VOMITING Patient taking differently: Take 10 mg by mouth every 6 (six) hours as needed for nausea or vomiting.  08/16/19  Yes Curt Bears, MD  QUEtiapine (SEROQUEL) 100 MG tablet TAKE 1 AND 1/2 TABLETS(150 MG) BY MOUTH AT BEDTIME Patient taking differently: Take 150 mg by mouth at bedtime.  08/17/19  Yes Martinique, [name], MD  rOPINIRole (REQUIP) 0.25 MG tablet Take 2 tablets (0.5 mg total) by mouth at bedtime. 06/28/19  Yes Martinique, [name], MD  VENTOLIN HFA 108 (90 Base) MCG/ACT inhaler INHALE 2 PUFFS INTO THE LUNGS EVERY 6 HOURS AS NEEDED FOR WHEEZING OR SHORTNESS OF BREATH Patient taking differently: Inhale 2 puffs into the lungs every 6 (six) hours as needed for wheezing or shortness of breath.  09/19/17  Yes Martinique, [name], MD  haloperidol (HALDOL) 2 MG tablet Take 1 mg by mouth every 4 (four) hours as needed for seizure. 08/27/19   [provider]  LORazepam (ATIVAN) 1 MG tablet Take 1 mg by mouth every 4 (four) hours as needed for agitation. 08/27/19   [provider]  oxyCODONE  (OXY IR/ROXICODONE) 5 MG immediate release tablet Take 1-2 tablets by mouth every 2 (two) hours as needed for pain. 08/28/19   [provider]    Physical Exam: Vitals:   08/18/2019 1145 08/18/2019 1200 08/30/2019 1215 09/01/2019 1230  BP: (!) 72/51 (!) 72/53 (!) 73/51 (!) 77/50  Pulse: (!) 132 (!) 132 (!) 130 (!) 129  Resp: (!) 26 (!) 27 (!) 24 (!) 25  Temp:      TempSrc:      SpO2: 98% 98% 97% 98%    Constitutional: NAD, calm, comfortable, lethargic Vitals:   09/01/2019 1145 09/01/2019 1200 08/24/2019 1215 08/18/2019 1230  BP: (!) 72/51 (!) 72/53 (!) 73/51 (!) 77/50  Pulse: (!) 132 (!) 132 (!) 130 (!) 129  Resp: (!) 26 (!) 27 (!) 24 (!) 25  Temp:      TempSrc:      SpO2: 98% 98% 97% 98%   Eyes: PERRL, lids and conjunctivae normal ENMT: Mucous membranes are dry. Posterior pharynx clear of any exudate or lesions.Normal dentition.  Neck: normal, supple, no masses, no thyromegaly Respiratory: coarse breath sounds. Normal respiratory effort. No accessory muscle use.  Cardiovascular: tachycardic, s1, s2,  Abdomen: nnondistended, No hepatosplenomegaly.  Musculoskeletal: no clubbing. No joint deformity upper and lower extremities..  Skin: no rashes, lesions, ulcers. No induration Neurologic: CN 2-12 grossly intact. No tremors, difficult to fully assess given lethargy Psychiatric: Unable to assess given lethargy   Labs on Admission: I have personally reviewed following labs and imaging studies  CBC: Recent Labs  Lab 08/23/2019 0950  WBC 11.2*  NEUTROABS 10.1*  HGB 10.7*  HCT 33.1*  MCV 101.2*  PLT 062   Basic Metabolic Panel: Recent Labs  Lab 08/21/2019 0950  NA 136  K 2.9*  CL 90*  CO2 36*  GLUCOSE 142*  BUN 26*  CREATININE 0.81  CALCIUM 8.8*   GFR: Estimated Creatinine Clearance: 60.2 mL/min (by C-G formula based on SCr of 0.81 mg/dL). Liver Function Tests: Recent Labs  Lab 08/15/2019 0950  AST 27  ALT 24  ALKPHOS 98  BILITOT 0.6  PROT 6.2*  ALBUMIN 3.4*   No  results for input(s): LIPASE, AMYLASE in the last 168 hours. No results for input(s): AMMONIA in the last 168 hours. Coagulation Profile: Recent Labs  Lab 08/14/2019 [phone]  INR 1.3*   Cardiac Enzymes: No results for input(s): CKTOTAL, CKMB, CKMBINDEX, TROPONINI in the last 168 hours. BNP (last 3 results) No results for input(s): PROBNP in the last 8760 hours. HbA1C: No results for input(s): HGBA1C in the last 72 hours. CBG: No results for input(s): GLUCAP in the last 168 hours. Lipid Profile: No results for input(s): CHOL, HDL, LDLCALC, TRIG, CHOLHDL, LDLDIRECT in the last 72 hours. Thyroid Function Tests: No results for input(s): TSH, T4TOTAL, FREET4, T3FREE, THYROIDAB in the last 72 hours. Anemia Panel: No results for input(s): VITAMINB12, FOLATE, FERRITIN, TIBC, IRON, RETICCTPCT in the last 72 hours. Urine analysis:    Component Value Date/Time   COLORURINE YELLOW 07/03/2019 [place] 07/03/2019 1727   LABSPEC 1.012 07/03/2019 1727   PHURINE 5.0 07/03/2019 1727   GLUCOSEU NEGATIVE 07/03/2019 1727   GLUCOSEU NEGATIVE 08/04/2018 1721   HGBUR NEGATIVE 07/03/2019 1727   BILIRUBINUR NEGATIVE 07/03/2019 1727   KETONESUR NEGATIVE 07/03/2019 1727   PROTEINUR NEGATIVE 07/03/2019 1727   UROBILINOGEN 0.2 08/04/2018 1721   NITRITE NEGATIVE 07/03/2019 1727   LEUKOCYTESUR NEGATIVE 07/03/2019 1727   Sepsis Labs: !!!!!!!!!!!!!!!!!!!!!!!!!!!!!!!!!!!!!!!!!!!! @LABRCNTIP (procalcitonin:4,lacticidven:4) ) Recent Results (from the past 240 hour(s))  MRSA PCR Screening     Status: None   Collection Time: 08/19/19  7:06 PM   Specimen: Nasal Mucosa; Nasopharyngeal  Result Value Ref Range Status   MRSA by PCR NEGATIVE NEGATIVE Final    Comment:        The GeneXpert MRSA Assay (FDA approved for NASAL specimens only), is one component of a comprehensive MRSA colonization surveillance program. It is not intended to diagnose MRSA infection nor to guide or monitor treatment  for MRSA infections. Performed at [facility], [place] [address] [place], [place] [address]      Radiological Exams on Admission: Dg Chest Port 1 View  Result Date: 08/26/2019 CLINICAL DATA:  Per EMS-Pt usually active at home, family reports that this morning she was not very responsive and altered mentation. Pt is a hospice pt. Medical hx of cancer, CHF, CAD and COPD.sepsis EXAM: PORTABLE CHEST 1 VIEW COMPARISON:  Radiograph 08/20/2019, CT 07/26/2019 FINDINGS: Large cardiac silhouette. New RIGHT lower lobe airspace disease. Underlying volume loss in the RIGHT hemithorax consistent prior lobectomy. New LEFT effusion. No pneumothorax. No acute osseous abnormality. IMPRESSION: 1. Concern for RIGHT lower lobe pneumonia. 2. Bilateral pleural effusions. Electronically Signed   By: Suzy Bouchard M.D.   On: 08/08/2019 10:23    EKG: Independently reviewed. Sinus tach  Assessment/Plan Principal Problem:   Sepsis due to pneumonia [facility]) Active Problems:   CAD (coronary artery disease)   Other emphysema (HCC)   Chronic anxiety   Small cell lung cancer, right (HCC)   Goals of care, counseling/discussion   Chronic diastolic CHF (congestive heart failure) (HCC)   Shock (HCC)   SVT (supraventricular tachycardia) ([place])   HCAP (healthcare-associated pneumonia)   1. Severe sepsis with septic shock secondary to PNA present on admission 1. Presents with worsening mentation leading up to hospital presentation 2. In ED, pt noted to be hypotensive to the 70's, tachycardic, febrile with CXR findings of PNA 3. See below, patient's husband is at bedside and pt's wishes are for DNR and full comfort with exception of trial of antibiotic x 24hrs 4. Very poor prognosis 2. Toxic metabolic encephalopathy 1. Likely secondary to above sepsis 3. CAD 1. EKG reviewed, demonstrates sinus tach 4. COPD 1. Continue on O2 as needed for comfort 2. Focus  on comfort care per below 5. Small Cell Lung  Ca 1. Had been followed by Dr. Julien Nordmann as outpatient 2. No transition to comfort care status 6. End of Life 1. Met with patient's husband at bedside. Pt currently encephalopathic and unable to participate in goals of care 2. Per husband, pt's wishes are for DNR, no mechanical ventillation, no CPR, no cardioversion 3. Family is aware of patient's very poor prognosis with anticipation of hours to days. Family would like trial of abx for PNA with no other supportive measure. If no improvement in 24-48hrs, consider d/c abx at that time 7. Hypokalemia 1. Replacement given in ED 2. No focus on comfort. No further blood draws  DVT prophylaxis: Comfort  Code Status: DNR/Comfort Family Communication: Pt's husband at bedside  Disposition Plan: Uncertain at this time  Consults called:  Admission status: Inpatient, as would likely require 2 midnight stay for comofort care   Marylu Lund MD [facility] Pager On Amion  If 7PM-7AM, please contact night-coverage  08/24/2019, 12:35 PM

## 2019-08-29 NOTE — ED Triage Notes (Addendum)
Pt BIBA from home. Pt arrive with visible stool around feet and legs  Per EMS- Pt usually active at home, family reports that this morning she was not very responsive this AM, altered mentation. Pt is hospice pt, arrived with DNR.  Pt answers yes/no questions, not verbally responsive.   Pt had 102.2 tympanic temp cbg 208 80/45 136 hr spO2 92% NRB- on 2L [place]- 76%- pt wears home O2 (2L) at baseline.  R 27  Pt received 300 mL NaCl PTA- 20 g R FA 1 emesis occurrence with EMS - received 4 mg IV zofran (0830)  Per EMS- hospice RN notified of pts transport to ED, will come to ED.

## 2019-08-29 NOTE — Progress Notes (Signed)
Received report from RN

## 2019-08-29 NOTE — ED Provider Notes (Signed)
[facility] DEPT Provider Note   CSN: [id number] Arrival date & time: 08/23/2019  4627     History   Chief Complaint No chief complaint on file.   HPI Tonya Bradley is a 68 y.o. female.     HPI  Tonya Bradley is a 68 y.o. female with multiple medical issues, including aflutter, CHF, COPD, lung ca, presents to ER with complaint of weakness, shortness of breath. Brought in by EMS for AMS, weakness, hypoxia onset today. Per EMS, fever, tachycardia, hypotension. Given 300mg  bolus of saline. Pt denies any complaints, however altered, unable to speak, opens eyes and nods. Recent admission for aflutter and fluid overload, cardioverted on 08/20/19, discharged on 08/21/19.  Past Medical History:  Diagnosis Date   Acute on chronic diastolic (congestive) heart failure ([place]) 06/12/2019   AKI (acute kidney injury) ([place])    Anemia    Anxiety    Arthritis    "back"   Atrial flutter ([place]) 03/13/2016   CAD (coronary artery disease)    Cancer (HCC)    skin cancer   Cataract    Colon polyp    Complication of anesthesia    BP dropped during esophagus diltation in 2017-admitted for 3 days.    COPD (chronic obstructive pulmonary disease) (HCC)    Depression    Dysrhythmia    Emphysema of lung (HCC)    GERD (gastroesophageal reflux disease)    Hypercholesterolemia    Lung cancer ([place]) dx'd 01/2019   Osteoporosis    Oxygen deficiency     Patient Active Problem List   Diagnosis Date Noted   Atrial flutter with rapid ventricular response ([place]) 08/19/2019   HCAP (healthcare-associated pneumonia) 07/03/2019   Diarrhea 07/03/2019   Anemia 07/03/2019   SVT (supraventricular tachycardia) ([place]) 06/17/2019   Acute respiratory failure with hypoxemia (HCC)    Shock ([place])    AKI (acute kidney injury) ([place])    Chronic diastolic CHF (congestive heart failure) ([place]) 06/12/2019   Chemotherapy induced nausea and vomiting 05/26/2019   Goals of  care, counseling/discussion 03/25/2019   Encounter for antineoplastic chemotherapy 03/25/2019   Small cell lung cancer, right ([place]) 02/18/2019   S/P Right Upper Lobectomy, Right Middle Lobectomy 02/04/2019   Colitis 01/01/2019   Dehydration 01/01/2019   Hypokalemia 01/01/2019   Dysphonia 06/19/2018   Bilateral lower extremity edema 02/16/2018   Lumbar stenosis 01/19/2018   GERD (gastroesophageal reflux disease) 11/18/2017   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   COPD (chronic obstructive pulmonary disease) ([place]) 09/09/2017   Varicose veins of bilateral lower extremities with other complications 03/50/0938   Chronic pain disorder 05/26/2017   Back pain 05/26/2017   Insomnia 05/26/2017   Osteopenia 11/26/2016   Other emphysema ([place]) 11/07/2016   Chronic anxiety 11/07/2016   Chronic hypoxemic respiratory failure ([place]) 11/07/2016   Constipation 06/12/2016   Right carotid bruit 04/03/2016   CAD (coronary artery disease) 04/03/2016   Tobacco abuse 04/03/2016   Atrial flutter ([place]) 03/13/2016   Hiatal hernia    Reflux esophagitis    Schatzki's ring    Dysphagia 03/01/2016   Solitary pulmonary nodule 03/01/2016   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   Microscopic colitis 08/15/2014   Loose stools 06/08/2014   BCC (basal cell carcinoma), face 09/13/2012    Past Surgical History:  Procedure Laterality Date   A-FLUTTER ABLATION N/A 05/19/2017   Procedure: A-Flutter Ablation;  Surgeon: Evans Lance, MD;  Location: [facility];  Service: Cardiovascular;  Laterality: N/A;   APPENDECTOMY     BASAL CELL CARCINOMA EXCISION     CARDIAC CATHETERIZATION     CARDIOVERSION N/A 08/20/2019   Procedure: CARDIOVERSION;  Surgeon: Jerline Pain, MD;  Location: [facility];  Service: Cardiovascular;  Laterality: N/A;   CATARACT EXTRACTION Bilateral    CHOLECYSTECTOMY     COLONOSCOPY N/A 06/23/2014   Dr.  Rourk:Rectal and colonic polyps-removed, Pancolonic diverticulosis. lymphocytic colitis   CORONARY ANGIOPLASTY WITH STENT PLACEMENT     ESOPHAGEAL DILATION N/A 03/13/2016   Procedure: ESOPHAGEAL DILATION;  Surgeon: Daneil Dolin, MD;  [facility];  Service: Endoscopy;  Laterality: N/A;   ESOPHAGOGASTRODUODENOSCOPY N/A 03/13/2016   Procedure: ESOPHAGOGASTRODUODENOSCOPY (EGD);  Surgeon: Daneil Dolin, MD;  [facility];  Service: Endoscopy;  Laterality: N/A;  0930   EYE SURGERY     cataract removed, bilateral   IR THORACENTESIS ASP PLEURAL SPACE W/IMG GUIDE  02/15/2019   LUMBAR LAMINECTOMY/DECOMPRESSION MICRODISCECTOMY N/A 01/19/2018   Procedure: L4-5 DECOMPRESSION;  Surgeon: Marybelle Killings, MD;  Location: [facility];  Service: Orthopedics;  Laterality: N/A;   TEE WITHOUT CARDIOVERSION N/A 08/20/2019   Procedure: TRANSESOPHAGEAL ECHOCARDIOGRAM (TEE);  Surgeon: Jerline Pain, MD;  Location: [facility];  Service: Cardiovascular;  Laterality: N/A;   VIDEO ASSISTED THORACOSCOPY (VATS)/ LOBECTOMY Right 02/04/2019   Procedure: VIDEO ASSISTED THORACOSCOPY (VATS)/RIGHT UPPER AND MIDDLE LOBECTOMY;  Surgeon: Melrose Nakayama, MD;  Location: [facility];  Service: Thoracic;  Laterality: Right;   VIDEO BRONCHOSCOPY N/A 02/14/2019   Procedure: BRONCHOSCOPY WITH SEDATION;  Surgeon: Melrose Nakayama, MD;  Location: [facility];  Service: Thoracic;  Laterality: N/A;     OB History   No obstetric history on file.      Home Medications    Prior to Admission medications   Medication Sig Start Date End Date Taking? Authorizing Provider  ALPRAZolam Duanne Moron) 0.5 MG tablet Take 1 tablet (0.5 mg total) by mouth 2 (two) times daily. 07/09/19   [place], [name], MD  amiodarone (PACERONE) 100 MG tablet Take 1 tablet (100 mg total) by mouth daily. 08/22/19   [place] Shi, MD  apixaban (ELIQUIS) 5 MG TABS tablet Take 1 tablet (5 mg total) by mouth 2 (two) times daily. 08/21/19 09/20/19  [place] Shi, MD  atorvastatin (LIPITOR) 80 MG tablet Take 80 mg by mouth daily.    [provider]  Cholecalciferol (VITAMIN D) 2000 units CAPS Take 2,000 Units by mouth daily.    [provider]  cycloSPORINE (RESTASIS) 0.05 % ophthalmic emulsion Place 1 drop into both eyes 2 (two) times daily.    [provider]  Fluticasone-Umeclidin-Vilant (TRELEGY ELLIPTA) 100-62.5-25 MCG/INH AEPB Inhale 1 puff into the lungs daily. 06/29/19   Martinique, [name], MD  furosemide (LASIX) 40 MG tablet Take 1 tablet (40 mg total) by mouth 2 (two) times daily. Take extra dose as needed for leg swellings/weight gain/shortness of breath 08/25/19 09/24/19  Imogene Burn, PA-C  HYDROcodone-acetaminophen (NORCO/VICODIN) 5-325 MG tablet Take 1 tablet by mouth every 6 (six) hours as needed for moderate pain.    [provider]  ipratropium-albuterol (DUONEB) 0.5-2.5 (3) MG/3ML SOLN Take 3 mLs by nebulization every 4 (four) hours as needed (shortness of breath or wheezing). 06/19/19   Donne Hazel, MD  magnesium oxide (MAG-OX) 400 MG tablet Take 400 mg by mouth 2 (two) times daily.    [provider]  metoprolol tartrate (LOPRESSOR) 25 MG tablet Take 1 tablet (25 mg  total) by mouth 2 (two) times daily. Hold SBP<95, HR <60 08/25/19   Imogene Burn, PA-C  Multiple Vitamin (MULTIVITAMIN WITH MINERALS) TABS tablet Take 1 tablet by mouth daily.    [provider]  potassium chloride (K-DUR) 10 MEQ tablet Take 10 mEq by mouth daily.    [provider]  prochlorperazine (COMPAZINE) 10 MG tablet TAKE 1 TABLET(10 MG) BY MOUTH EVERY 6 HOURS AS NEEDED FOR NAUSEA OR VOMITING Patient taking differently: Take 10 mg by mouth every 6 (six) hours as needed for nausea or vomiting.  08/16/19   Curt Bears, MD  QUEtiapine (SEROQUEL) 100 MG tablet TAKE 1 AND 1/2 TABLETS(150 MG) BY MOUTH AT BEDTIME Patient taking differently: Take 150 mg by mouth at bedtime.  08/17/19   [place], Betty  G, MD  rOPINIRole (REQUIP) 0.25 MG tablet Take 2 tablets (0.5 mg total) by mouth at bedtime. 06/28/19   [place], [name], MD  VENTOLIN HFA 108 (90 Base) MCG/ACT inhaler INHALE 2 PUFFS INTO THE LUNGS EVERY 6 HOURS AS NEEDED FOR WHEEZING OR SHORTNESS OF BREATH Patient taking differently: Inhale 2 puffs into the lungs every 6 (six) hours as needed for wheezing or shortness of breath.  09/19/17   [place]ique, [name], MD    Family History Family History  Problem Relation Age of Onset   Breast cancer Sister 7   Heart disease Sister    Heart attack Father    Heart disease Father    Stroke Mother    Heart disease Mother    Hyperlipidemia Mother    Heart attack Sister    Heart disease Brother        ?heart failure   Congestive Heart Failure Brother    Hypertension Son    Heart disease Maternal Grandfather    Cancer Cousin        lymphoma   Hypertension Son    Stomach cancer Other    Colon cancer Neg Hx    Pancreatic cancer Neg Hx     Social History Social History   Tobacco Use   Smoking status: Former Smoker    Years: 54.00    Types: Cigarettes    Start date: 06/14/1966    Quit date: 04/02/2019    Years since quitting: 0.4   Smokeless tobacco: Never Used   Tobacco comment: 1-2 packs daily. Chantix too expensive; pt states that she quit 2 weeks ago (02/04/19)   Substance Use Topics   Alcohol use: No    Alcohol/week: 0.0 standard drinks   Drug use: No     Allergies   Morphine and related   Review of Systems Review of Systems  Unable to perform ROS: Mental status change  Constitutional: Positive for fever.  Respiratory: Positive for shortness of breath.      Physical Exam Updated Vital Signs BP (!) 81/54 (BP Location: Left Arm)    Pulse (!) 133    Resp (!) 29    SpO2 93%   Physical Exam Vitals signs and nursing note reviewed.  Constitutional:      Appearance: She is well-developed. She is ill-appearing.  HENT:     Head: Normocephalic.  Eyes:      Conjunctiva/sclera: Conjunctivae normal.  Neck:     Musculoskeletal: Neck supple.  Cardiovascular:     Rate and Rhythm: Tachycardia present.     Heart sounds: Normal heart sounds.  Pulmonary:     Breath sounds: Rhonchi and rales present.  Abdominal:     General: Bowel  sounds are normal. There is no distension.     Palpations: Abdomen is soft.     Tenderness: There is no abdominal tenderness. There is no rebound.  Musculoskeletal:        General: Swelling present.     Comments: 3+ LE pitting edema bilaterally  Skin:    General: Skin is warm and dry.     Capillary Refill: Capillary refill takes less than 2 seconds.     Findings: No rash.  Neurological:     Mental Status: She is alert.     Comments: Responds to voice. Able to nod yes and no, unable to speak. Able to  Move both upper extremities slightly on command.   Psychiatric:        Behavior: Behavior normal.      ED Treatments / Results  Labs (all labs ordered are listed, but only abnormal results are displayed) Labs Reviewed  COMPREHENSIVE METABOLIC PANEL - Abnormal; Notable for the following components:      Result Value   Potassium 2.9 (*)    Chloride 90 (*)    CO2 36 (*)    Glucose, Bld 142 (*)    BUN 26 (*)    Calcium 8.8 (*)    Total Protein 6.2 (*)    Albumin 3.4 (*)    All other components within normal limits  CBC WITH DIFFERENTIAL/PLATELET - Abnormal; Notable for the following components:   WBC 11.2 (*)    RBC 3.27 (*)    Hemoglobin 10.7 (*)    HCT 33.1 (*)    MCV 101.2 (*)    Neutro Abs 10.1 (*)    Lymphs Abs 0.4 (*)    Abs Immature Granulocytes 0.08 (*)    All other components within normal limits  PROTIME-INR - Abnormal; Notable for the following components:   Prothrombin Time 16.5 (*)    INR 1.3 (*)    All other components within normal limits  CULTURE, BLOOD (ROUTINE X 2)  CULTURE, BLOOD (ROUTINE X 2)  URINE CULTURE  SARS CORONAVIRUS 2 (HOSPITAL ORDER, [facility])   LACTIC ACID, PLASMA  APTT  URINALYSIS, ROUTINE W REFLEX MICROSCOPIC    EKG EKG Interpretation  Date/Time:  Sunday August 29 2019 09:06:14 EDT Ventricular Rate:  133 PR Interval:    QRS Duration: 90 QT Interval:  333 QTC Calculation: 496 R Axis:   -49 Text Interpretation:  Sinus or ectopic atrial tachycardia LAD, consider left anterior fascicular block Abnormal R-wave progression, early transition Nonspecific repol abnormality, diffuse leads Borderline prolonged QT interval Confirmed by Malvin Johns [phone]) on 08/30/2019 9:44:46 AM   Radiology No results found.  Procedures Procedures (including critical care time)  Medications Ordered in ED Medications  0.9 %  sodium chloride infusion (has no administration in time range)  ceFEPIme (MAXIPIME) 2 g in sodium chloride 0.9 % 100 mL IVPB (has no administration in time range)  metroNIDAZOLE (FLAGYL) IVPB 500 mg (has no administration in time range)  sodium chloride 0.9 % bolus 1,000 mL (has no administration in time range)  acetaminophen (TYLENOL) suppository 650 mg (has no administration in time range)  vancomycin (VANCOCIN) 1,500 mg in sodium chloride 0.9 % 500 mL IVPB (has no administration in time range)     Initial Impression / Assessment and Plan / ED Course  I have reviewed the triage vital signs and the nursing notes.  Pertinent labs & imaging results that were available during my care of the patient were reviewed  by me and considered in my medical decision making (see chart for details).        CRITICAL CARE Performed by: Taron Conrey Total critical care time: 45 minutes Critical care time was exclusive of separately billable procedures and treating other patients. Critical care was necessary to treat or prevent imminent or life-threatening deterioration. Critical care was time spent personally by me on the following activities: development of treatment plan with patient and/or surrogate as well as  nursing, discussions with consultants, evaluation of patient's response to treatment, examination of patient, obtaining history from patient or surrogate, ordering and performing treatments and interventions, ordering and review of laboratory studies, ordering and review of radiographic studies, pulse oximetry and re-evaluation of patient's condition.   9:19 AM Pt seen and examined. Pt with fever, tachycardia, hypotension. Recent admission for aflutter and fluid overload. Pt is DNR.  based on review of recent notes, she was cardioverted on 08/20/19 and started on new medications at that time, so will continue resuscitate until able to speak with family. Pt does appear to be fluid overloaded, will hydrate slowly.   9:55 AM Spoke with husband, confirmed acute mental status change this morning and "shaking." confirmed DNI DNR. Antibiotics and fluids are OK. Will continue with labs and resuscitation.   10:41 AM Spoke with hospice. Advised its up to husband what treatment course.  I spoke with husband again.  At this point he does not want pressors.  He had agreed to antibiotics and comfort care until he gets here.  He states he will be here in 15 minutes.  Patient continues to have unstable vital signs.  11:51 AM Husband at bedside. Confirms continue symptomatic treatment for now and antibiotics. Continue to keep patient comfortable. Pt is now a little more alert. She asked how she got to the hospital. Still very confused and lethargic.   Spoke with hospitalist, will admit for comfort care and antibiotics.     Final Clinical Impressions(s) / ED Diagnoses   Final diagnoses:  Sepsis without acute organ dysfunction, due to unspecified organism [facility])    ED Discharge Orders    None       Jeannett Senior, PA-C 08/03/2019 1714    Malvin Johns, MD 09/02/19 [phone]

## 2019-08-29 NOTE — ED Notes (Signed)
This RN transitioned pt to 6L O2 by [facility]- pt O2 sats decreased to 85%.  Pt returned to NRB.

## 2019-08-29 NOTE — ED Notes (Signed)
ED TO INPATIENT HANDOFF REPORT  Name/Age/Gender Tonya Bradley 68 y.o. female  Code Status    Code Status Orders  (From admission, onward)         Start     Ordered   08/19/2019 1228  Do not attempt resuscitation (DNR)  Continuous    Question Answer Comment  In the event of cardiac or respiratory ARREST Do not call a "code blue"   In the event of cardiac or respiratory ARREST Do not perform Intubation, CPR, defibrillation or ACLS   In the event of cardiac or respiratory ARREST Use medication by any route, position, wound care, and other measures to relive pain and suffering. May use oxygen, suction and manual treatment of airway obstruction as needed for comfort.      08/06/2019 1230        Code Status History    Date Active Date Inactive Code Status Order ID Comments User Context   08/19/2019 0135 08/21/2019 1803 DNR [id number]  Etta Quill, DO ED   07/03/2019 2026 07/06/2019 1617 DNR [id number]  Jani Gravel, MD ED   06/12/2019 1554 06/20/2019 0101 DNR [id number]  Alma Friendly, MD ED   06/12/2019 1119 06/12/2019 1554 DNR [id number]  Carmin Muskrat, MD ED   02/04/2019 1556 02/18/2019 1528 Full Code [id number]  Antony Odea, PA-C Inpatient   01/02/2019 0303 01/03/2019 2346 DNR [id number]  Toy Baker, MD ED   01/19/2018 1557 01/20/2018 1537 Full Code [id number]  Lanae Crumbly, PA-C Inpatient   05/19/2017 1003 05/19/2017 1925 Full Code [id number]  Evans Lance, MD Inpatient   03/13/2016 1748 03/15/2016 1743 Full Code [id number]  Samuella Cota, MD Inpatient   Advance Care Planning Activity    Advance Directive Documentation     Most Recent Value  Type of Advance Directive  Out of facility DNR (pink MOST or yellow form)  Pre-existing out of facility DNR order (yellow form or pink MOST form)  -  "MOST" Form in Place?  -      Home/SNF/Other Home  Chief Complaint possible sepsis  Level of Care/Admitting Diagnosis ED Disposition    ED Disposition Condition [facility] Area: [place] [[id number]]  Level of Care: Med-Surg [16]  Covid Evaluation: Person Under Investigation (PUI)  Diagnosis: Sepsis due to pneumonia [facility]) [[id number]]  Admitting Physician: [facility], [place]  Attending Physician: Donne Hazel [[id number]]  Estimated length of stay: 3 - 4 days  Certification:: I certify this patient will need inpatient services for at least 2 midnights  PT Class (Do Not Modify): Inpatient [101]  PT Acc Code (Do Not Modify): Private [1]       Medical History Past Medical History:  Diagnosis Date  . Acute on chronic diastolic (congestive) heart failure ([place]) 06/12/2019  . AKI (acute kidney injury) ([place])   . Anemia   . Anxiety   . Arthritis    "back"  . Atrial flutter ([place]) 03/13/2016  . CAD (coronary artery disease)   . Cancer ([place])    skin cancer  . Cataract   . Colon polyp   . Complication of anesthesia    BP dropped during esophagus diltation in 2017-admitted for 3 days.   Marland Kitchen COPD (chronic obstructive pulmonary disease) ([place])   . Depression   . Dysrhythmia   . Emphysema of lung ([place])   . GERD (gastroesophageal reflux disease)   . Hypercholesterolemia   . Lung cancer ([place]) dx'd 01/2019  .  Osteoporosis   . Oxygen deficiency     Allergies Allergies  Allergen Reactions  . Morphine And Related Other (See Comments)    hallucinations    IV Location/Drains/Wounds Patient Lines/Drains/Airways Status   Active Line/Drains/Airways    Name:   Placement date:   Placement time:   Site:   Days:   Peripheral IV 08/30/2019 Right Forearm   08/13/2019    -    Forearm   less than 1   Peripheral IV 08/20/2019 Left Antecubital   08/25/2019    0945    Antecubital   less than 1   Incision (Closed) 02/04/19 Chest Right   02/04/19    1225     206   Incision (Closed) 02/14/19 N/A Other (Comment)   02/14/19    1037     196          Labs/Imaging Results for orders placed or performed during the hospital encounter of 08/14/2019 (from  the past 48 hour(s))  Lactic acid, plasma     Status: None   Collection Time: 08/28/2019  9:50 AM  Result Value Ref Range   Lactic Acid, Venous 1.4 0.5 - 1.9 mmol/L    Comment: Performed at [facility], [place] [address] [place], [place] [address]  Comprehensive metabolic panel     Status: Abnormal   Collection Time: 08/09/2019  9:50 AM  Result Value Ref Range   Sodium 136 135 - 145 mmol/L   Potassium 2.9 (L) 3.5 - 5.1 mmol/L   Chloride 90 (L) 98 - 111 mmol/L   CO2 36 (H) 22 - 32 mmol/L   Glucose, Bld 142 (H) 70 - 99 mg/dL   BUN 26 (H) 8 - 23 mg/dL   Creatinine, Ser 0.81 0.44 - 1.00 mg/dL   Calcium 8.8 (L) 8.9 - 10.3 mg/dL   Total Protein 6.2 (L) 6.5 - 8.1 g/dL   Albumin 3.4 (L) 3.5 - 5.0 g/dL   AST 27 15 - 41 U/L   ALT 24 0 - 44 U/L   Alkaline Phosphatase 98 38 - 126 U/L   Total Bilirubin 0.6 0.3 - 1.2 mg/dL   GFR calc non Af Amer >60 >60 mL/min   GFR calc Af Amer >60 >60 mL/min   Anion gap 10 5 - 15    Comment: Performed at [facility], [place] [address], [place], [place] [address]  CBC WITH DIFFERENTIAL     Status: Abnormal   Collection Time: 08/16/2019  9:50 AM  Result Value Ref Range   WBC 11.2 (H) 4.0 - 10.5 K/uL   RBC 3.27 (L) 3.87 - 5.11 MIL/uL   Hemoglobin 10.7 (L) 12.0 - 15.0 g/dL   HCT 33.1 (L) 36.0 - 46.0 %   MCV 101.2 (H) 80.0 - 100.0 fL   MCH 32.7 26.0 - 34.0 pg   MCHC 32.3 30.0 - 36.0 g/dL   RDW 14.6 11.5 - 15.5 %   Platelets 237 150 - 400 K/uL   nRBC 0.0 0.0 - 0.2 %   Neutrophils Relative % 91 %   Neutro Abs 10.1 (H) 1.7 - 7.7 K/uL   Lymphocytes Relative 4 %   Lymphs Abs 0.4 (L) 0.7 - 4.0 K/uL   Monocytes Relative 4 %   Monocytes Absolute 0.5 0.1 - 1.0 K/uL   Eosinophils Relative 0 %   Eosinophils Absolute 0.0 0.0 - 0.5 K/uL   Basophils Relative 0 %   Basophils Absolute 0.0 0.0 - 0.1 K/uL  Immature Granulocytes 1 %   Abs Immature Granulocytes 0.08 (H) 0.00 - 0.07 K/uL    Comment: Performed at [facility], [place] [address] [place], [place] [address]  APTT     Status: None   Collection Time: 08/24/2019  9:50 AM  Result Value Ref Range   aPTT 30 24 - 36 seconds    Comment: Performed at [facility], [place] [address], [place], [place] [address]  Protime-INR     Status: Abnormal   Collection Time: 08/09/2019  9:50 AM  Result Value Ref Range   Prothrombin Time 16.5 (H) 11.4 - 15.2 seconds   INR 1.3 (H) 0.8 - 1.2    Comment: (NOTE) INR goal varies based on device and disease states. Performed at [facility], [place] [address], [place], [place] [address]   SARS Coronavirus 2 [facility] order, Performed in [facility] hospital lab) Nasopharyngeal Nasopharyngeal Swab     Status: None   Collection Time: 08/06/2019  9:52 AM   Specimen: Nasopharyngeal Swab  Result Value Ref Range   SARS Coronavirus 2 NEGATIVE NEGATIVE    Comment: (NOTE) If result is NEGATIVE SARS-CoV-2 target nucleic acids are NOT DETECTED. The SARS-CoV-2 RNA is generally detectable in upper and lower  respiratory specimens during the acute phase of infection. The lowest  concentration of SARS-CoV-2 viral copies this assay can detect is 250  copies / mL. A negative result does not preclude SARS-CoV-2 infection  and should not be used as the sole basis for treatment or other  patient management decisions.  A negative result may occur with  improper specimen collection / handling, submission of specimen other  than nasopharyngeal swab, presence of viral mutation(s) within the  areas targeted by this assay, and inadequate number of viral copies  (<250 copies / mL). A negative result must be combined with clinical  observations, patient history, and epidemiological information. If result is POSITIVE SARS-CoV-2 target nucleic acids are DETECTED. The SARS-CoV-2 RNA is generally detectable in upper and lower  respiratory specimens dur ing the acute phase of infection.  Positive  results are  indicative of active infection with SARS-CoV-2.  Clinical  correlation with patient history and other diagnostic information is  necessary to determine patient infection status.  Positive results do  not rule out bacterial infection or co-infection with other viruses. If result is PRESUMPTIVE POSTIVE SARS-CoV-2 nucleic acids MAY BE PRESENT.   A presumptive positive result was obtained on the submitted specimen  and confirmed on repeat testing.  While 2019 novel coronavirus  (SARS-CoV-2) nucleic acids may be present in the submitted sample  additional confirmatory testing may be necessary for epidemiological  and / or clinical management purposes  to differentiate between  SARS-CoV-2 and other Sarbecovirus currently known to infect humans.  If clinically indicated additional testing with an alternate test  methodology [phone]) is advised. The SARS-CoV-2 RNA is generally  detectable in upper and lower respiratory sp ecimens during the acute  phase of infection. The expected result is Negative. Fact Sheet for Patients:  StrictlyIdeas.no Fact Sheet for Healthcare Providers: BankingDealers.co.za This test is not yet approved or cleared by the [place] FDA and has been authorized for detection and/or diagnosis of SARS-CoV-2 by FDA under an Emergency Use Authorization (EUA).  This EUA will remain in effect (meaning this test can be used) for the duration of the COVID-19 declaration under Section 564(b)(1) of the Act, 21 U.S.C. section 360bbb-3(b)(1), unless the authorization is terminated  or revoked sooner. Performed at [facility], [place] [address] [place], [place] [address]    Dg Chest Port 1 View  Result Date: 08/22/2019 CLINICAL DATA:  Per EMS-Pt usually active at home, family reports that this morning she was not very responsive and altered mentation. Pt is a hospice pt. Medical hx of cancer, CHF, CAD and  COPD.sepsis EXAM: PORTABLE CHEST 1 VIEW COMPARISON:  Radiograph 08/20/2019, CT 07/26/2019 FINDINGS: Large cardiac silhouette. New RIGHT lower lobe airspace disease. Underlying volume loss in the RIGHT hemithorax consistent prior lobectomy. New LEFT effusion. No pneumothorax. No acute osseous abnormality. IMPRESSION: 1. Concern for RIGHT lower lobe pneumonia. 2. Bilateral pleural effusions. Electronically Signed   By: Suzy Bouchard M.D.   On: 08/24/2019 10:23    Pending Labs Unresulted Labs (From admission, onward)    Start     Ordered   08/10/2019 0911  Blood Culture (routine x 2)  BLOOD CULTURE X 2,   STAT     08/20/2019 0911          Vitals/Pain Today's Vitals   08/28/2019 1300 08/24/2019 1315 08/20/2019 1330 08/16/2019 1331  BP: (!) 80/49     Pulse: (!) 129 (!) 129 (!) 128 (!) 129  Resp: (!) 25 (!) 26 (!) 28 (!) 27  Temp:      TempSrc:      SpO2: 97% (!) 86% (!) 84% (!) 85%    Isolation Precautions No active isolations  Medications Medications  0.9 %  sodium chloride infusion (1,000 mLs Intravenous New Bag/Given 08/20/2019 1258)  vancomycin (VANCOCIN) 1,500 mg in sodium chloride 0.9 % 500 mL IVPB (1,500 mg Intravenous New Bag/Given 08/13/2019 1140)  potassium chloride 10 mEq in 100 mL IVPB (10 mEq Intravenous New Bag/Given 08/17/2019 1257)  morphine 2 MG/ML injection 1-2 mg (2 mg Intravenous Given 08/11/2019 1311)  LORazepam (ATIVAN) injection 0.5 mg (0.5 mg Intravenous Given 09/01/2019 1315)  cefTRIAXone (ROCEPHIN) 2 g in sodium chloride 0.9 % 100 mL IVPB (has no administration in time range)  azithromycin (ZITHROMAX) 500 mg in sodium chloride 0.9 % 250 mL IVPB (has no administration in time range)  acetaminophen (TYLENOL) suppository 650 mg (has no administration in time range)  ceFEPIme (MAXIPIME) 2 g in sodium chloride 0.9 % 100 mL IVPB (0 g Intravenous Stopped 08/15/2019 1020)  metroNIDAZOLE (FLAGYL) IVPB 500 mg (0 mg Intravenous Stopped 08/09/2019 1129)  sodium chloride 0.9 % bolus 1,000 mL (0 mLs  Intravenous Stopped 08/11/2019 1043)  acetaminophen (TYLENOL) suppository 650 mg (650 mg Rectal Given 08/25/2019 0930)    Mobility non-ambulatory

## 2019-08-30 ENCOUNTER — Encounter (HOSPITAL_COMMUNITY): Payer: Self-pay | Admitting: *Deleted

## 2019-08-30 ENCOUNTER — Telehealth: Payer: Self-pay | Admitting: *Deleted

## 2019-08-30 DIAGNOSIS — I2510 Atherosclerotic heart disease of native coronary artery without angina pectoris: Secondary | ICD-10-CM

## 2019-08-30 DIAGNOSIS — J189 Pneumonia, unspecified organism: Secondary | ICD-10-CM

## 2019-08-30 DIAGNOSIS — I5032 Chronic diastolic (congestive) heart failure: Secondary | ICD-10-CM

## 2019-08-30 DIAGNOSIS — F419 Anxiety disorder, unspecified: Secondary | ICD-10-CM

## 2019-08-30 DIAGNOSIS — A419 Sepsis, unspecified organism: Principal | ICD-10-CM

## 2019-08-30 LAB — BLOOD CULTURE ID PANEL (REFLEXED)

## 2019-08-30 MED ORDER — ACETAMINOPHEN 325 MG PO TABS: 650.0000 mg | ORAL_TABLET | Freq: Four times a day (QID) | ORAL | Status: DC | PRN

## 2019-08-30 MED ORDER — BIOTENE DRY MOUTH MT LIQD: 15.0000 mL | OROMUCOSAL | Status: DC | PRN

## 2019-08-30 MED ORDER — LORAZEPAM 2 MG/ML IJ SOLN
1.0000 mg | Freq: Once | INTRAMUSCULAR | Status: AC
  Administered 2019-08-30: 1 mg via INTRAVENOUS
  Filled 2019-08-30: qty 1

## 2019-08-30 MED ORDER — GLYCOPYRROLATE 0.2 MG/ML IJ SOLN: 0.2000 mg | INTRAMUSCULAR | Status: DC | PRN

## 2019-08-30 MED ORDER — HALOPERIDOL 0.5 MG PO TABS: 0.5000 mg | ORAL_TABLET | ORAL | Status: DC | PRN

## 2019-08-30 MED ORDER — MORPHINE BOLUS VIA INFUSION
1.0000 mg | INTRAVENOUS | Status: DC | PRN
  Administered 2019-08-30 (×2): 1 mg via INTRAVENOUS
  Filled 2019-08-30: qty 1

## 2019-08-30 MED ORDER — POLYVINYL ALCOHOL 1.4 % OP SOLN
1.0000 [drp] | Freq: Four times a day (QID) | OPHTHALMIC | Status: DC | PRN
  Administered 2019-08-30: 1 [drp] via OPHTHALMIC
  Filled 2019-08-30: qty 15

## 2019-08-30 MED ORDER — GLYCOPYRROLATE 0.2 MG/ML IJ SOLN
0.2000 mg | INTRAMUSCULAR | Status: DC | PRN
  Administered 2019-08-30: 15:00:00 0.2 mg via INTRAVENOUS
  Filled 2019-08-30: qty 1

## 2019-08-30 MED ORDER — ONDANSETRON HCL 4 MG/2ML IJ SOLN: 4.0000 mg | Freq: Four times a day (QID) | INTRAMUSCULAR | Status: DC | PRN

## 2019-08-30 MED ORDER — ACETAMINOPHEN 650 MG RE SUPP: 650.0000 mg | Freq: Four times a day (QID) | RECTAL | Status: DC | PRN

## 2019-08-30 MED ORDER — HALOPERIDOL LACTATE 5 MG/ML IJ SOLN: 0.5000 mg | INTRAMUSCULAR | Status: DC | PRN

## 2019-08-30 MED ORDER — GLYCOPYRROLATE 1 MG PO TABS: 1.0000 mg | ORAL_TABLET | ORAL | Status: DC | PRN

## 2019-08-30 MED ORDER — MORPHINE SULFATE (PF) 2 MG/ML IV SOLN
1.0000 mg | Freq: Once | INTRAVENOUS | Status: AC
  Administered 2019-08-30: 01:00:00 1 mg via INTRAVENOUS
  Filled 2019-08-30: qty 1

## 2019-08-30 MED ORDER — MORPHINE SULFATE (PF) 2 MG/ML IV SOLN: 1.0000 mg | INTRAVENOUS | Status: DC | PRN

## 2019-08-30 MED ORDER — ONDANSETRON 4 MG PO TBDP: 4.0000 mg | ORAL_TABLET | Freq: Four times a day (QID) | ORAL | Status: DC | PRN

## 2019-08-30 MED ORDER — HALOPERIDOL LACTATE 2 MG/ML PO CONC
0.5000 mg | ORAL | Status: DC | PRN
  Filled 2019-08-30: qty 0.3

## 2019-08-30 MED ORDER — MORPHINE 100MG IN NS 100ML (1MG/ML) PREMIX INFUSION
4.0000 mg/h | INTRAVENOUS | Status: DC
  Administered 2019-08-30: 4 mg/h via INTRAVENOUS
  Filled 2019-08-30: qty 100

## 2019-08-30 NOTE — Progress Notes (Signed)
PHARMACY - PHYSICIAN COMMUNICATION CRITICAL VALUE ALERT - BLOOD CULTURE IDENTIFICATION (BCID)  Tonya Bradley is an 68 y.o. female who presented to [facility] on 08/22/2019 with a chief complaint of sepsis due to pneumonia.  Assessment: 4/4 blood cultures + Klebsiella Pneumoniae (no carbapenem detected)   Name of physician (or Provider) Contacted: Lamar Blinks, FNP  Current antibiotics: Ceftriaxone 2gm IV q24h, Azithromycin 500mg  IV q24h  Changes to prescribed antibiotics recommended:  Patient is on recommended antibiotics - No changes needed  Results for orders placed or performed during the hospital encounter of 08/12/2019  Blood Culture ID Panel (Reflexed) (Collected: 08/18/2019  9:50 AM)  Result Value Ref Range   Enterococcus species NOT DETECTED NOT DETECTED   Listeria monocytogenes NOT DETECTED NOT DETECTED   Staphylococcus species NOT DETECTED NOT DETECTED   Staphylococcus aureus (BCID) NOT DETECTED NOT DETECTED   Streptococcus species NOT DETECTED NOT DETECTED   Streptococcus agalactiae NOT DETECTED NOT DETECTED   Streptococcus pneumoniae NOT DETECTED NOT DETECTED   Streptococcus pyogenes NOT DETECTED NOT DETECTED   Acinetobacter baumannii NOT DETECTED NOT DETECTED   Enterobacteriaceae species DETECTED (A) NOT DETECTED   Enterobacter cloacae complex NOT DETECTED NOT DETECTED   Escherichia coli NOT DETECTED NOT DETECTED   Klebsiella oxytoca NOT DETECTED NOT DETECTED   Klebsiella pneumoniae DETECTED (A) NOT DETECTED   Proteus species NOT DETECTED NOT DETECTED   Serratia marcescens NOT DETECTED NOT DETECTED   Carbapenem resistance NOT DETECTED NOT DETECTED   Haemophilus influenzae NOT DETECTED NOT DETECTED   Neisseria meningitidis NOT DETECTED NOT DETECTED   Pseudomonas aeruginosa NOT DETECTED NOT DETECTED   Candida albicans NOT DETECTED NOT DETECTED   Candida glabrata NOT DETECTED NOT DETECTED   Candida krusei NOT DETECTED NOT DETECTED   Candida parapsilosis NOT DETECTED NOT  DETECTED   Candida tropicalis NOT DETECTED NOT DETECTED    Everette Rank, PharmD 08/30/2019  4:44 AM

## 2019-08-30 NOTE — Consult Note (Signed)
[facility]--Pt is active with [facility] admitted on Friday 9/25 for end-stage for diastolic CHF.  Pt had been in the [facility] home-[facility] care program since June and elected to transition to Hospice due to physical decline.  Communicated with spouse-Richard today by telephone due to Covid 19 to offer support.  Plan was made to transfer pt to [place] [facility] today for comfort care at end-of-life.  Later rec'd a call from Delfino Lovett stating that pt had deteriorated requiring an increase in Morphine gtt and he felt pt was not stable for transfer.  Will continue to provide support to pt and spouse as it appears pt will not survive this hospitalization  [facility] is responsible for this hospitalization as it is covered/related to the terminal dx.  Thank you for partnering with [facility] in pt's care.  Wynetta Fines RN office # [phone] cell # [phone]

## 2019-08-30 NOTE — Progress Notes (Signed)
Palliative Medicine RN Note: During triage, it is noted that pt is either with hospice or with [facility]. I spoke with [place] with [facility].   Mrs Barre was admitted to [facility] ([facility]) in July and was emergently transferred to home hospice on Friday 9/25. They report the Mr Reffitt called the [facility] team leader this morning and explained that they panicked yesterday and called 911.  JoAnn and I discussed pt's diagnoses, and she reported this will be a GIP admission for [facility]. I spoke with pt's RN at [facility]; she reports pt is not imminently dying, and she believes pt is stable for transport. PMT does not have a provider available to see Mrs Spraggins until tomorrow. I called back and spoke with Cheri; she will reach out to the family to see if they're ready to discuss going to [facility] in [facility].   Updated Alinda Sierras w TOC team.  Marjie Skiff Layonna Dobie, RN, BSN, [facility] Palliative Medicine Team 08/30/2019 11:22 AM Office [phone]

## 2019-08-30 NOTE — Telephone Encounter (Signed)
Husband Neeka Urista called stating that pt is currently in hospital on 6 th floor.  Pt was admitted with pneumonia, and will problably pass on today.  R[place]hard would like to know results of MRI Brain - did it show pt has cancer in the brain or not ? Richard stated right now pneumonia is what "  killing pt " . Richard   Phone     [phone].

## 2019-08-30 NOTE — Progress Notes (Signed)
PROGRESS NOTE    Tonya Bradley  YNW:[id number] DOB: [birth year] DOA: 08/14/2019 PCP: [place], [name], MD    Brief Narrative:  68 year old female who presented with confusion.  She does have significant past medical history for small cell lung cancer, COPD, and coronary artery disease.  History limited due to her cognitive decline, per her family she had gradually increasing confusion for the last 2 weeks.  On the day of admission she became lethargic and hypoxic.  On initial physical examination her temperature was 102.7 F, heart rate 130, respirate 30, systolic blood pressure in the 70s.  She had dry mucous membranes, her lungs are coarse breath sounds bilaterally, heart S1-S2 present with me, the abdomen was soft nontender, no lower extremity edema. Sodium 136, potassium 2.9, chloride 90, bicarb 36, glucose 142, BUN 26, creatinine 0.81, white count 11.2, hemoglobin 10.7, hematocrit 33.1, platelets 237.  SARS COVID-19 was negative.  Chest radiograph with left rotation, hypoinflation, with new right lower lobe alveolar infiltrate.  KG 133 bpm, normal axis, SVT rhythm, no ST segment T wave changes, positive artifact.  Patient was admitted to the hospital with a working diagnosis of acute hypoxic respiratory failure due to right lower lobe pneumonia, complicated by severe sepsis.  Patient with critical condition on admission, his family decided to continue care under comfort measures, CODE STATUS was changed to DNR.   Assessment & Plan:   Principal Problem:   Sepsis due to pneumonia [facility]) Active Problems:   CAD (coronary artery disease)   Other emphysema ([place])   Chronic anxiety   Small cell lung cancer, right ([place])   Goals of care, counseling/discussion   Chronic diastolic CHF (congestive heart failure) (HCC)   Shock ([place])   SVT (supraventricular tachycardia) ([place])   HCAP (healthcare-associated pneumonia)   1. Acute hypoxic respiratory failure due to right lower lobe pneumonia,  complicated with sepsis/ present on admission. Patient's family have decided to allow natural death, patient with very poor prognosis, with small cell lung cancer. This am patient continue in respiratory distress, despite morphine infusion at 2 mg per H. Will continue with comfort measures, will increase morphine drip to 4 mg per H, due respiratory distress, add per protocol lorazepam and haldol as needed. Continue slow rate of supplemental 02 per [place] for comfort.  I spoke with patient's family at the bedside about patient's conditioned and imminent death, all questions were addressed.  2. Metabolic encephalopathy. Episodic agitation, will continue with as needed anxiolytics and antipsychotics.   3. Diastolic heart failure. Patient with septic shock, very poor prognosis, will continue with comfort care per protocol.   4. COPD. No evidence of exacerbation.   5. Small cell lung cancer. Poor prognosis, no complicated with septic shock, will continue with palliative care.    DVT prophylaxis: scd   Code Status: DNR  Family Communication: I spoke with patient's husband and sister at the bedside and all questions were addressed.  Disposition Plan/ discharge barriers: imminent death.   Body mass index is 29.07 kg/m. Malnutrition Type:      Malnutrition Characteristics:      Nutrition Interventions:     RN Pressure Injury Documentation:     Consultants:     Procedures:     Antimicrobials:       Subjective: Patient is not responsive, but had 2 episodes of agitation per her family at the bedside, no vomiting.   Objective: Vitals:   08/28/2019 1331 08/13/2019 1414 08/08/2019 1421 08/15/2019 2200  BP:  Marland Kitchen)  94/56    Pulse: (!) 129 (!) 130    Resp: (!) 27 20    Temp:  98.9 F (37.2 C) 98.8 F (37.1 C)   TempSrc:  Axillary Oral   SpO2: (!) 85% (!) 89%    Weight:    72.1 kg  Height:    5\' 2"  (1.575 m)    Intake/Output Summary (Last 24 hours) at 08/30/2019 1259 Last data  filed at 08/30/2019 0258 Gross per 24 hour  Intake 2335.61 ml  Output -  Net 2335.61 ml   Filed Weights   08/24/2019 2200  Weight: 72.1 kg    Examination:   General: Patient continue to be in respiratory distress with agonal breathing. Neurology: not responsive to voice or touch, eyes are closed.  E ENT: positive pallor and dry mucous membranes. Positive neck accessory muscle use.  Cardiovascular: No JVD. S1-S2 present, rhythmic, no gallops, rubs, or murmurs. No lower extremity edema. Pulmonary: positive breath sounds bilaterally, diffuse rhonchi bilateral, no wheezing.  Gastrointestinal. Abdomen with distention with no organomegaly, non tender, no rebound or guarding Skin. No rashes Musculoskeletal: no joint deformities     Data Reviewed: I have personally reviewed following labs and imaging studies  CBC: Recent Labs  Lab 08/30/2019 0950  WBC 11.2*  NEUTROABS 10.1*  HGB 10.7*  HCT 33.1*  MCV 101.2*  PLT 970   Basic Metabolic Panel: Recent Labs  Lab 08/26/2019 0950  NA 136  K 2.9*  CL 90*  CO2 36*  GLUCOSE 142*  BUN 26*  CREATININE 0.81  CALCIUM 8.8*   GFR: Estimated Creatinine Clearance: 61 mL/min (by C-G formula based on SCr of 0.81 mg/dL). Liver Function Tests: Recent Labs  Lab 08/03/2019 0950  AST 27  ALT 24  ALKPHOS 98  BILITOT 0.6  PROT 6.2*  ALBUMIN 3.4*   No results for input(s): LIPASE, AMYLASE in the last 168 hours. No results for input(s): AMMONIA in the last 168 hours. Coagulation Profile: Recent Labs  Lab 08/05/2019 0950  INR 1.3*   Cardiac Enzymes: No results for input(s): CKTOTAL, CKMB, CKMBINDEX, TROPONINI in the last 168 hours. BNP (last 3 results) No results for input(s): PROBNP in the last 8760 hours. HbA1C: No results for input(s): HGBA1C in the last 72 hours. CBG: No results for input(s): GLUCAP in the last 168 hours. Lipid Profile: No results for input(s): CHOL, HDL, LDLCALC, TRIG, CHOLHDL, LDLDIRECT in the last 72 hours.  Thyroid Function Tests: No results for input(s): TSH, T4TOTAL, FREET4, T3FREE, THYROIDAB in the last 72 hours. Anemia Panel: No results for input(s): VITAMINB12, FOLATE, FERRITIN, TIBC, IRON, RETICCTPCT in the last 72 hours.    Radiology Studies: I have reviewed all of the imaging during this hospital visit personally     Scheduled Meds: Continuous Infusions: . sodium chloride 1,000 mL (08/30/19 0815)  . azithromycin Stopped (08/13/2019 1719)  . cefTRIAXone (ROCEPHIN)  IV Stopped (08/21/2019 1757)  . morphine 2 mg/hr (08/30/19 0631)     LOS: 1 day        Suhana Wilner Gerome Apley, MD

## 2019-08-30 NOTE — Telephone Encounter (Signed)
MRI of the brain was negative.  Sorry to hear about her poor outcome.

## 2019-09-01 LAB — CULTURE, BLOOD (ROUTINE X 2)
Special Requests: ADEQUATE
Special Requests: ADEQUATE

## 2019-09-01 NOTE — Progress Notes (Signed)
Patient deceased

## 2019-09-02 NOTE — Death Summary Note (Signed)
Death Summary  Tonya Bradley HGD:[id number] DOB: 1951/06/30 DOA: 02-Sep-2019  PCP: Martinique, [name], MD  Admit date: Sep 02, 2019 Date of Death: 09-04-2019 Time of Death:  Notification: Martinique, [name], MD notified of death of 09-04-2019   History of present illness:  Tonya Bradley is a 68 y.o. female with a history of  small cell lung cancer, COPD, and coronary artery disease. Tonya Bradley presented with complaint of confusion.  Tonya Bradley did not improve after initial treatment with IV antibiotics, IV fluids and supplemental oxygen.  History limited due to her cognitive decline, per her family she had gradually increasing confusion for the last 2 weeks.  On the day of admission she became lethargic and hypoxic.  On initial physical examination her temperature was 102.7 F, heart rate 130, respiratory rate 30, systolic blood pressure in the 70s.  She had dry mucous membranes, her lungs had coarse breath sounds bilaterally, heart S1-S2 present and rhythmic, the abdomen was soft and nontender, no lower extremity edema. Sodium 136, potassium 2.9, chloride 90, bicarb 36, glucose 142, BUN 26, creatinine 0.81, white count 11.2, hemoglobin 10.7, hematocrit 33.1, platelets 237.  SARS COVID-19 was negative.  Chest radiograph with left rotation, hypoinflation, with new right lower lobe alveolar infiltrate.  EKG 133 bpm, normal axis, SVT rhythm, no ST segment T wave changes, positive artifact.  Patient was admitted to the hospital with a working diagnosis of acute hypoxic respiratory failure due to right lower lobe pneumonia, complicated by septic shock.  Patient with critical condition on admission, her family decided to continue care under comfort measures, CODE STATUS was changed to DNR.  Final Diagnoses:  1.   Septic shock 2.   Community acquired pneumonia 3.   Small cell lung cancer 4.   COPD   The results of significant diagnostics from this hospitalization (including imaging,  microbiology, ancillary and laboratory) are listed below for reference.    Significant Diagnostic Studies: Mr Brain Wo Contrast  Result Date: 08/23/2019 CLINICAL DATA:  Small-cell lung cancer, right. Small cell lung cancer, post completion initial therapy, assess treatment response. EXAM: MRI HEAD WITHOUT CONTRAST TECHNIQUE: Multiplanar, multiecho pulse sequences of the brain and surrounding structures were obtained without intravenous contrast. COMPARISON:  Head CT 04/18/2019, brain MRI 04/05/2019 FINDINGS: Due to difficulty with IV access only precontrast MRI imaging was performed today. The patient will return on later this week for postcontrast MRI imaging. Brain: There is no convincing evidence of acute infarct. No evidence of intracranial mass. No midline shift or extra-axial fluid collection. No chronic intracranial blood products. Minimal scattered T2/FLAIR hyperintensity within the cerebral white matter is nonspecific, but consistent with chronic small vessel ischemic disease. Cerebral volume is normal for age. Incidentally noted cavum septum pellucidum and cavum vergae. Vascular: Flow voids maintained within the proximal large arterial vessels. Skull and upper cervical spine: Normal marrow signal. Sinuses/Orbits: Imaged globes and orbits demonstrate no acute abnormality. Minimal ethmoid sinus mucosal thickening. Trace fluid within right mastoid air cells. IMPRESSION: Due to difficulty with IV access, only noncontrast MRI imaging was performed today. No evidence of acute intracranial abnormality or convincing evidence of intracranial metastatic disease on noncontrast imaging. The patient is to return later this week for postcontrast MRI imaging. Minimal scattered signal changes within the cerebral white matter are nonspecific, but may reflect sequela of chronic small vessel ischemic disease. Electronically Signed   By: Kellie Simmering   On: 08/23/2019 15:58   Mr Brain W Contrast  Result Date:  08/26/2019 CLINICAL DATA:  Lung cancer staging EXAM: MRI HEAD WITH CONTRAST TECHNIQUE: Multiplanar, multiecho pulse sequences of the brain and surrounding structures were obtained with intravenous contrast. CONTRAST:  55mL GADAVIST GADOBUTROL 1 MMOL/ML IV SOLN COMPARISON:  Brain MRI 04/05/2019 FINDINGS: Brain: No abnormal enhancement to suggest metastatic disease. No inflammatory type enhancement either. Vascular: Normal flow voids and vascular enhancements. Developmental venous anomaly in the inferior right frontal lobe Skull and upper cervical spine: Negative for marrow lesion. Small T2 hyperintense focus in the left parietal bone is stable and benign Sinuses/Orbits: Negative. IMPRESSION: Negative for metastatic disease. Electronically Signed   By: Monte Fantasia M.D.   On: 08/26/2019 11:08   Dg Chest Port 1 View  Result Date: 08/05/2019 CLINICAL DATA:  Per EMS-Pt usually active at home, family reports that this morning she was not very responsive and altered mentation. Pt is a hospice pt. Medical hx of cancer, CHF, CAD and COPD.sepsis EXAM: PORTABLE CHEST 1 VIEW COMPARISON:  Radiograph 08/20/2019, CT 07/26/2019 FINDINGS: Large cardiac silhouette. New RIGHT lower lobe airspace disease. Underlying volume loss in the RIGHT hemithorax consistent prior lobectomy. New LEFT effusion. No pneumothorax. No acute osseous abnormality. IMPRESSION: 1. Concern for RIGHT lower lobe pneumonia. 2. Bilateral pleural effusions. Electronically Signed   By: Suzy Bouchard M.D.   On: 08/03/2019 10:23   Dg Chest Port 1 View  Result Date: 08/20/2019 CLINICAL DATA:  Shortness of breath EXAM: PORTABLE CHEST 1 VIEW COMPARISON:  Two days ago FINDINGS: Mild increased hazy density on the right. There is postoperative volume loss on the right with diaphragm elevation. Stable distortion of the right hilum recently evaluated by CT. The left lung is clear. Normal heart size when allowing for rotation. Remote proximal right humerus  fracture IMPRESSION: 1. Mild hazy density on the right, favor atelectasis. 2. Right-sided lobectomy. Electronically Signed   By: Monte Fantasia M.D.   On: 08/20/2019 05:01   Dg Chest Portable 1 View  Result Date: 08/18/2019 CLINICAL DATA:  Shortness of breath attack EXAM: PORTABLE CHEST 1 VIEW COMPARISON:  07/26/2019 07/03/2019 FINDINGS: Cardiac shadow is within normal limits. Aortic calcifications are seen. Volume loss on the right is noted consistent with the prior surgical history. No focal infiltrate or sizable effusion is noted. No bony abnormality is seen. IMPRESSION: Volume loss on the right consistent with prior lobectomy. No acute abnormality noted. Electronically Signed   By: Inez Catalina M.D.   On: 08/18/2019 21:44    Microbiology: Recent Results (from the past 240 hour(s))  Blood Culture (routine x 2)     Status: None (Preliminary result)   Collection Time: 08/07/2019  9:50 AM   Specimen: BLOOD  Result Value Ref Range Status   Specimen Description   Final    BLOOD RIGHT ANTECUBITAL Performed at [facility] [address] [place], [place] [address]    Special Requests   Final    BOTTLES DRAWN AEROBIC AND ANAEROBIC Blood Culture adequate volume Performed at [facility] [address] [place], [place] [address]    Culture  Setup Time   Final    GRAM NEGATIVE RODS IN BOTH AEROBIC AND ANAEROBIC BOTTLES CRITICAL RESULT CALLED TO, READ BACK BY AND VERIFIED WITH: L. POINDEXTER,PHARMD [id number] 08/30/2019 Mena Goes Performed at [facility], [place] [address] [place], [place] [address]    Culture GRAM NEGATIVE RODS  Final   Report Status PENDING  Incomplete  Blood Culture ID Panel (Reflexed)     Status:  Abnormal   Collection Time: 09/01/2019  9:50 AM  Result Value Ref Range Status   Enterococcus species NOT DETECTED NOT DETECTED Final   Listeria monocytogenes NOT DETECTED NOT DETECTED Final   Staphylococcus species NOT DETECTED NOT DETECTED  Final   Staphylococcus aureus (BCID) NOT DETECTED NOT DETECTED Final   Streptococcus species NOT DETECTED NOT DETECTED Final   Streptococcus agalactiae NOT DETECTED NOT DETECTED Final   Streptococcus pneumoniae NOT DETECTED NOT DETECTED Final   Streptococcus pyogenes NOT DETECTED NOT DETECTED Final   Acinetobacter baumannii NOT DETECTED NOT DETECTED Final   Enterobacteriaceae species DETECTED (A) NOT DETECTED Final    Comment: Enterobacteriaceae represent a large family of gram-negative bacteria, not a single organism. CRITICAL RESULT CALLED TO, READ BACK BY AND VERIFIED WITH: L. POINDEXTER,PHARMD [id number] 08/30/2019 T. TYSOR    Enterobacter cloacae complex NOT DETECTED NOT DETECTED Final   Escherichia coli NOT DETECTED NOT DETECTED Final   Klebsiella oxytoca NOT DETECTED NOT DETECTED Final   Klebsiella pneumoniae DETECTED (A) NOT DETECTED Final    Comment: CRITICAL RESULT CALLED TO, READ BACK BY AND VERIFIED WITH: L. POINDEXTER,PHARMD [id number] 08/30/2019 T. TYSOR    Proteus species NOT DETECTED NOT DETECTED Final   Serratia marcescens NOT DETECTED NOT DETECTED Final   Carbapenem resistance NOT DETECTED NOT DETECTED Final   Haemophilus influenzae NOT DETECTED NOT DETECTED Final   Neisseria meningitidis NOT DETECTED NOT DETECTED Final   Pseudomonas aeruginosa NOT DETECTED NOT DETECTED Final   Candida albicans NOT DETECTED NOT DETECTED Final   Candida glabrata NOT DETECTED NOT DETECTED Final   Candida krusei NOT DETECTED NOT DETECTED Final   Candida parapsilosis NOT DETECTED NOT DETECTED Final   Candida tropicalis NOT DETECTED NOT DETECTED Final    Comment: Performed at [facility], [place] [address], [place], [place] [address]  Blood Culture (routine x 2)     Status: None (Preliminary result)   Collection Time: 08/18/2019  9:51 AM   Specimen: BLOOD  Result Value Ref Range Status   Specimen Description   Final    BLOOD LEFT ANTECUBITAL Performed at [facility]  [address] [place], [place] [address]    Special Requests   Final    BOTTLES DRAWN AEROBIC AND ANAEROBIC Blood Culture adequate volume Performed at [facility] [address], [place], [place] [address]    Culture  Setup Time   Final    GRAM NEGATIVE RODS IN BOTH AEROBIC AND ANAEROBIC BOTTLES CRITICAL VALUE NOTED.  VALUE IS CONSISTENT WITH PREVIOUSLY REPORTED AND CALLED VALUE. Performed at [facility], [place] [address] [place], [place] [address]    Culture GRAM NEGATIVE RODS  Final   Report Status PENDING  Incomplete  SARS Coronavirus 2 [facility] order, Performed in [facility] hospital lab) Nasopharyngeal Nasopharyngeal Swab     Status: None   Collection Time: 08/23/2019  9:52 AM   Specimen: Nasopharyngeal Swab  Result Value Ref Range Status   SARS Coronavirus 2 NEGATIVE NEGATIVE Final    Comment: (NOTE) If result is NEGATIVE SARS-CoV-2 target nucleic acids are NOT DETECTED. The SARS-CoV-2 RNA is generally detectable in upper and lower  respiratory specimens during the acute phase of infection. The lowest  concentration of SARS-CoV-2 viral copies this assay can detect is 250  copies / mL. A negative result does not preclude SARS-CoV-2 infection  and should not be used as the sole basis for treatment or other  patient management decisions.  A negative  result may occur with  improper specimen collection / handling, submission of specimen other  than nasopharyngeal swab, presence of viral mutation(s) within the  areas targeted by this assay, and inadequate number of viral copies  (<250 copies / mL). A negative result must be combined with clinical  observations, patient history, and epidemiological information. If result is POSITIVE SARS-CoV-2 target nucleic acids are DETECTED. The SARS-CoV-2 RNA is generally detectable in upper and lower  respiratory specimens dur ing the acute phase of infection.  Positive  results are indicative of active infection with  SARS-CoV-2.  Clinical  correlation with patient history and other diagnostic information is  necessary to determine patient infection status.  Positive results do  not rule out bacterial infection or co-infection with other viruses. If result is PRESUMPTIVE POSTIVE SARS-CoV-2 nucleic acids MAY BE PRESENT.   A presumptive positive result was obtained on the submitted specimen  and confirmed on repeat testing.  While 2019 novel coronavirus  (SARS-CoV-2) nucleic acids may be present in the submitted sample  additional confirmatory testing may be necessary for epidemiological  and / or clinical management purposes  to differentiate between  SARS-CoV-2 and other Sarbecovirus currently known to infect humans.  If clinically indicated additional testing with an alternate test  methodology [phone]) is advised. The SARS-CoV-2 RNA is generally  detectable in upper and lower respiratory sp ecimens during the acute  phase of infection. The expected result is Negative. Fact Sheet for Patients:  StrictlyIdeas.no Fact Sheet for Healthcare Providers: BankingDealers.co.za This test is not yet approved or cleared by the [place] FDA and has been authorized for detection and/or diagnosis of SARS-CoV-2 by FDA under an Emergency Use Authorization (EUA).  This EUA will remain in effect (meaning this test can be used) for the duration of the COVID-19 declaration under Section 564(b)(1) of the Act, 21 U.S.C. section 360bbb-3(b)(1), unless the authorization is terminated or revoked sooner. Performed at [facility], [place] [address] [place], [place] [address]      Labs: Basic Metabolic Panel: Recent Labs  Lab 08/17/2019 0950  NA 136  K 2.9*  CL 90*  CO2 36*  GLUCOSE 142*  BUN 26*  CREATININE 0.81  CALCIUM 8.8*   Liver Function Tests: Recent Labs  Lab 08/07/2019 0950  AST 27  ALT 24  ALKPHOS 98  BILITOT 0.6  PROT 6.2*    ALBUMIN 3.4*   No results for input(s): LIPASE, AMYLASE in the last 168 hours. No results for input(s): AMMONIA in the last 168 hours. CBC: Recent Labs  Lab 08/19/2019 0950  WBC 11.2*  NEUTROABS 10.1*  HGB 10.7*  HCT 33.1*  MCV 101.2*  PLT 237   Cardiac Enzymes: No results for input(s): CKTOTAL, CKMB, CKMBINDEX, TROPONINI in the last 168 hours. D-Dimer No results for input(s): DDIMER in the last 72 hours. BNP: Invalid input(s): POCBNP CBG: No results for input(s): GLUCAP in the last 168 hours. Anemia work up No results for input(s): VITAMINB12, FOLATE, FERRITIN, TIBC, IRON, RETICCTPCT in the last 72 hours. Urinalysis    Component Value Date/Time   COLORURINE YELLOW 07/03/2019 [place] 07/03/2019 1727   LABSPEC 1.012 07/03/2019 1727   PHURINE 5.0 07/03/2019 1727   GLUCOSEU NEGATIVE 07/03/2019 1727   GLUCOSEU NEGATIVE 08/04/2018 1721   HGBUR NEGATIVE 07/03/2019 [place] 07/03/2019 1727   [place] 07/03/2019 1727   PROTEINUR NEGATIVE 07/03/2019 1727   UROBILINOGEN 0.2 08/04/2018 1721   NITRITE NEGATIVE 07/03/2019 1727  LEUKOCYTESUR NEGATIVE 07/03/2019 1727   Sepsis Labs Invalid input(s): PROCALCITONIN,  WBC,  LACTICIDVEN     SIGNED:  Tawni Millers, MD  [facility] 09-02-2019, 9:04 AM Pager   If 7PM-7AM, please contact night-coverage [email or web address] Password [email or web address]

## 2019-09-02 NOTE — Progress Notes (Addendum)
76 ML of Morphine 100 mg in NS 100 ML infusion wasted in the stericycle witnessed by Wilkie Aye RN   This RN witnessed the above waste.

## 2019-09-02 NOTE — Telephone Encounter (Signed)
Tonya Bradley died this am

## 2019-09-02 DEATH — deceased

## 2019-09-07 ENCOUNTER — Ambulatory Visit: Payer: Medicare HMO | Admitting: Physician Assistant

## 2019-09-13 ENCOUNTER — Other Ambulatory Visit: Payer: Self-pay | Admitting: Family Medicine

## 2019-09-13 DIAGNOSIS — G4700 Insomnia, unspecified: Secondary | ICD-10-CM

## 2019-10-21 ENCOUNTER — Other Ambulatory Visit: Payer: Self-pay | Admitting: Family Medicine

## 2019-11-01 ENCOUNTER — Other Ambulatory Visit: Payer: Medicare HMO

## 2019-11-02 ENCOUNTER — Ambulatory Visit: Payer: Medicare HMO | Admitting: Internal Medicine

## 2020-01-12 IMAGING — DX PORTABLE CHEST - 1 VIEW
1 series · 1 of 1 positions shown · non-contrast
Comparison: 02/08/2019

CLINICAL DATA: Follow-up chest tube

EXAM:
PORTABLE CHEST 1 VIEW

[chest ap]
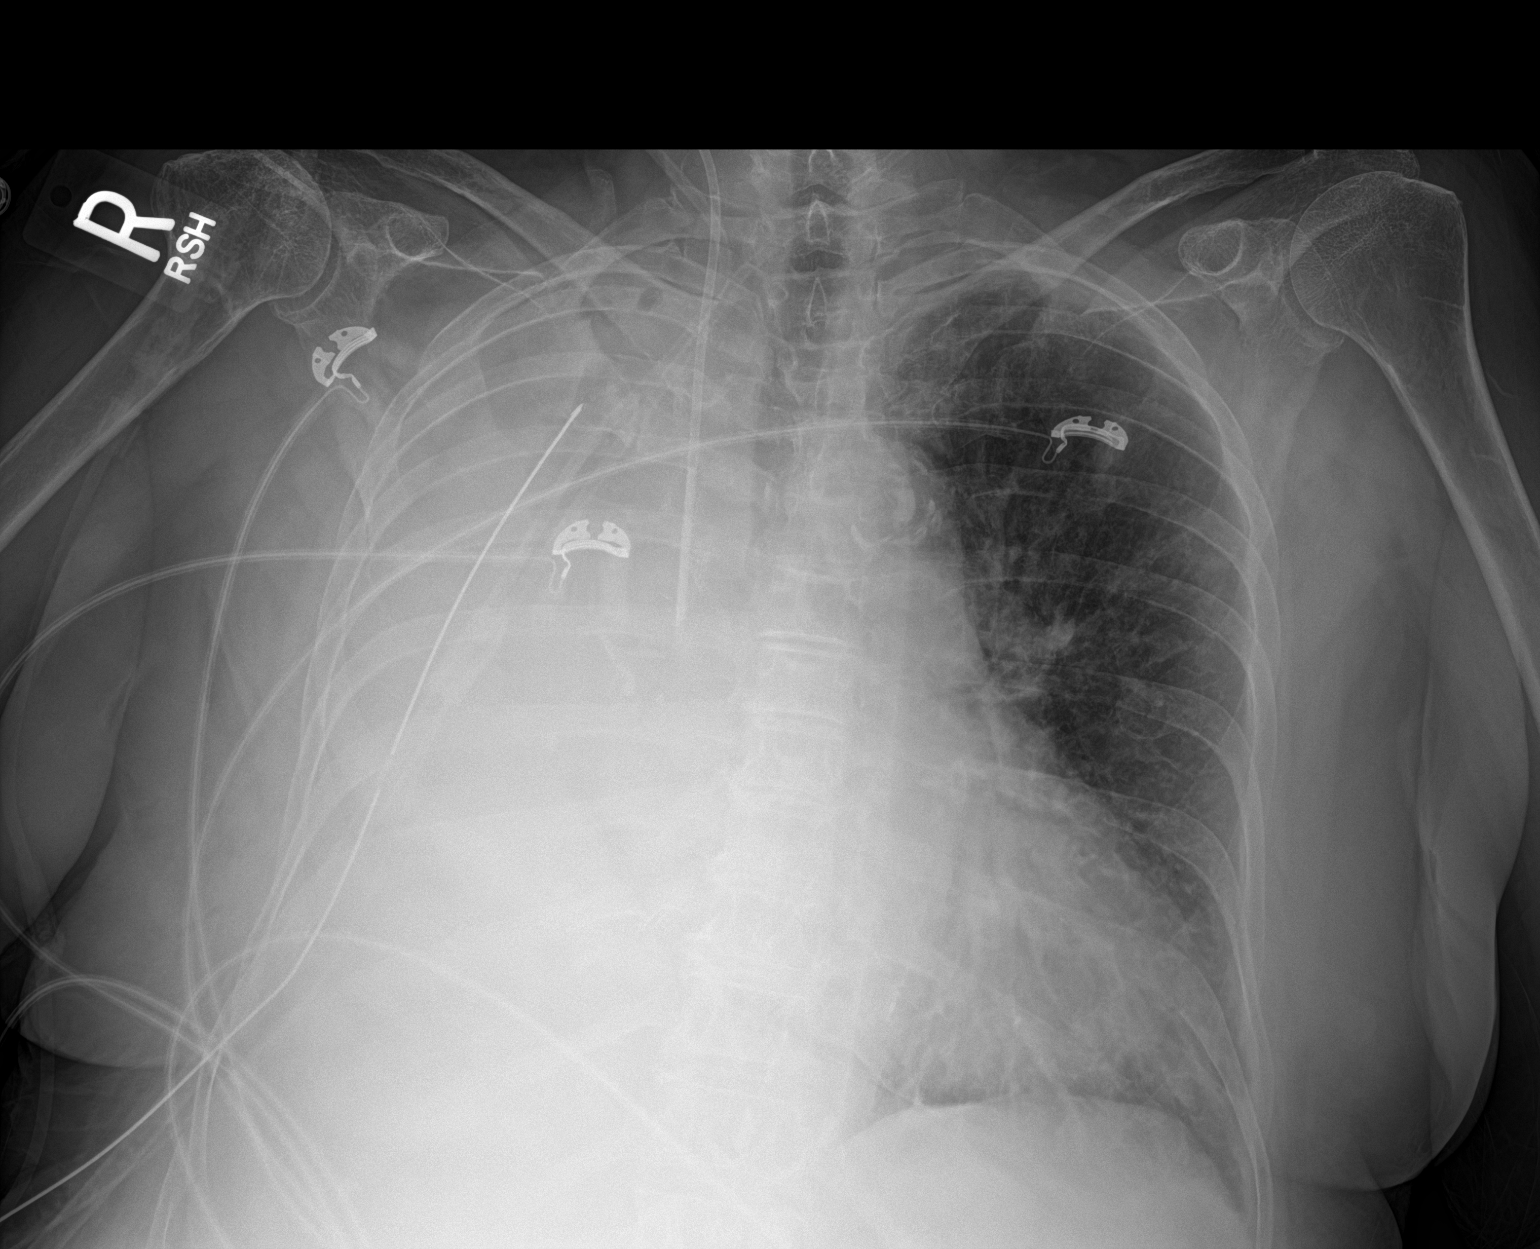

[1 of 1 positions shown; findings below may reference images not displayed]

FINDINGS: Right jugular central line is again seen. Cardiac shadow is stable.
Aortic calcifications are again seen. Right-sided chest tube is
noted with evidence of opacification of the right hemithorax. The
overall appearance is stable from the prior exam. No new focal
abnormality is noted.
IMPRESSION: Persistent opacification of the right hemithorax. No significant
aeration of the right lung is seen.

## 2020-01-13 IMAGING — DX PORTABLE CHEST - 1 VIEW
1 series · 1 of 1 positions shown · non-contrast
Comparison: Yesterday

CLINICAL DATA: Shortness of breath

EXAM:
PORTABLE CHEST 1 VIEW

[chest ap]
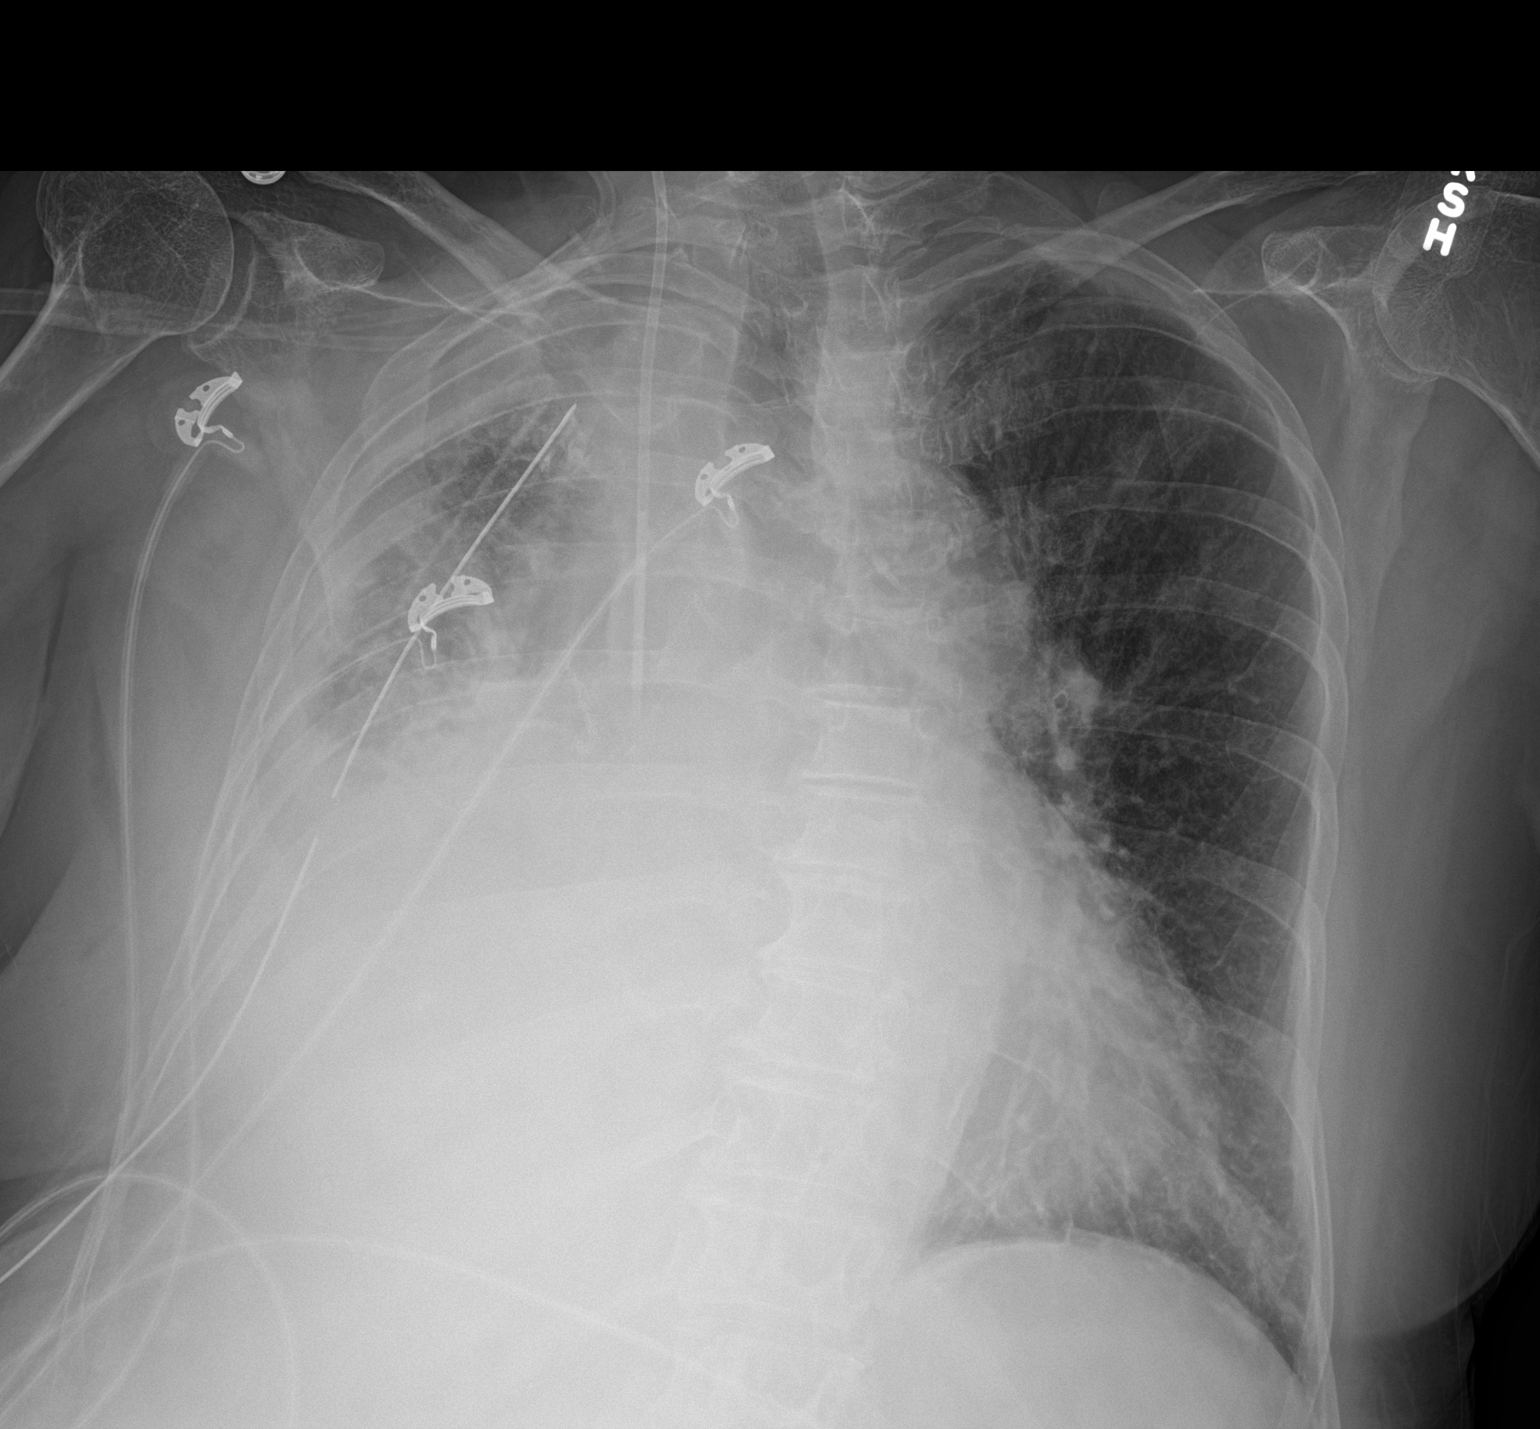

[1 of 1 positions shown; findings below may reference images not displayed]

FINDINGS: Continued right-sided chest tube. Partial re-expansion of the right
lung where there is still extensive pleural and parenchymal
opacification. Cardiomegaly. Vascular congestion/interstitial
opacity on the left. Stable central line positioning at the SVC
IMPRESSION: Partial reinflation of the right lung.

## 2020-01-14 IMAGING — DX PORTABLE CHEST - 1 VIEW
1 series · 1 of 1 positions shown · non-contrast
Comparison: Yesterday

CLINICAL DATA: Shortness of breath

EXAM:
PORTABLE CHEST 1 VIEW

[chest]
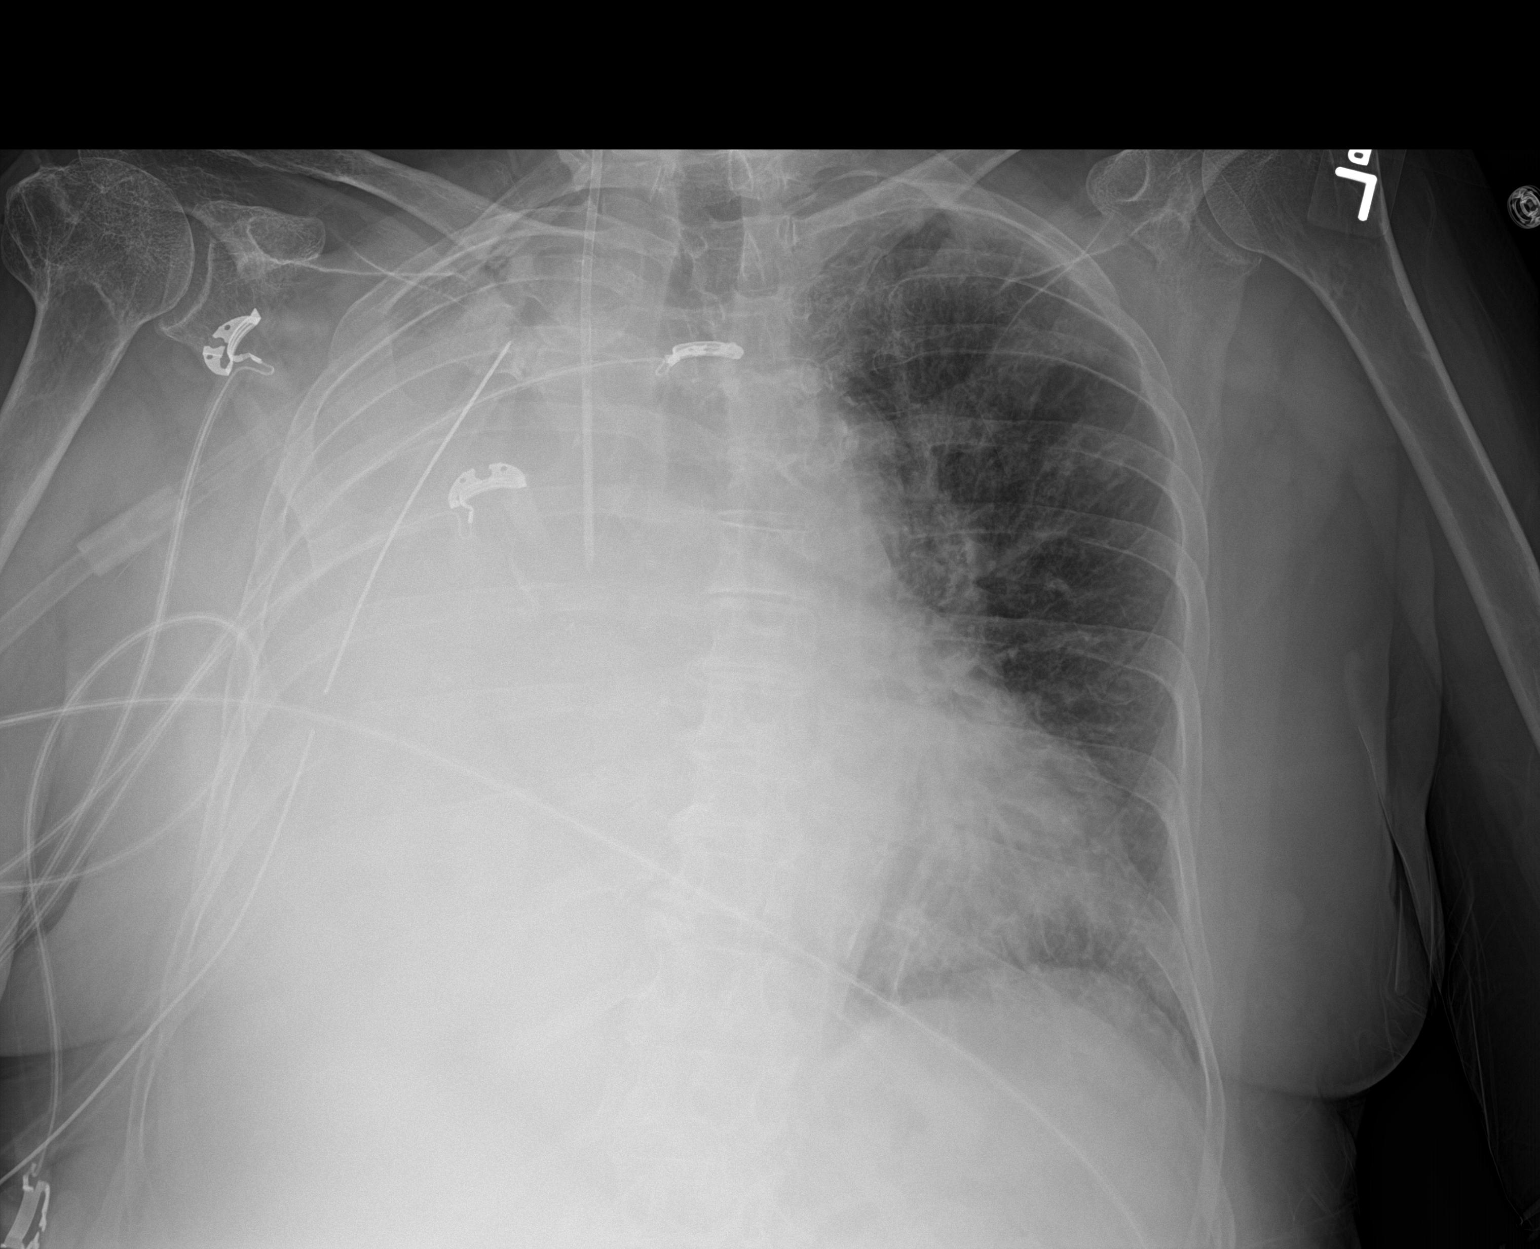

[1 of 1 positions shown; findings below may reference images not displayed]

FINDINGS: Worsening aeration on the right with complete white out of the right
chest. Right chest tube in place. Probable cardiomegaly. Stable
streaky density on the left. Unremarkable right IJ line positioning.
IMPRESSION: Worsening right-sided aeration with complete white out.

## 2020-01-17 IMAGING — CR CHEST - 2 VIEW
2 series · 2 of 2 positions shown · non-contrast
Comparison: 02/13/2019

CLINICAL DATA: Atelectasis

EXAM:
CHEST - 2 VIEW

[chest lat]
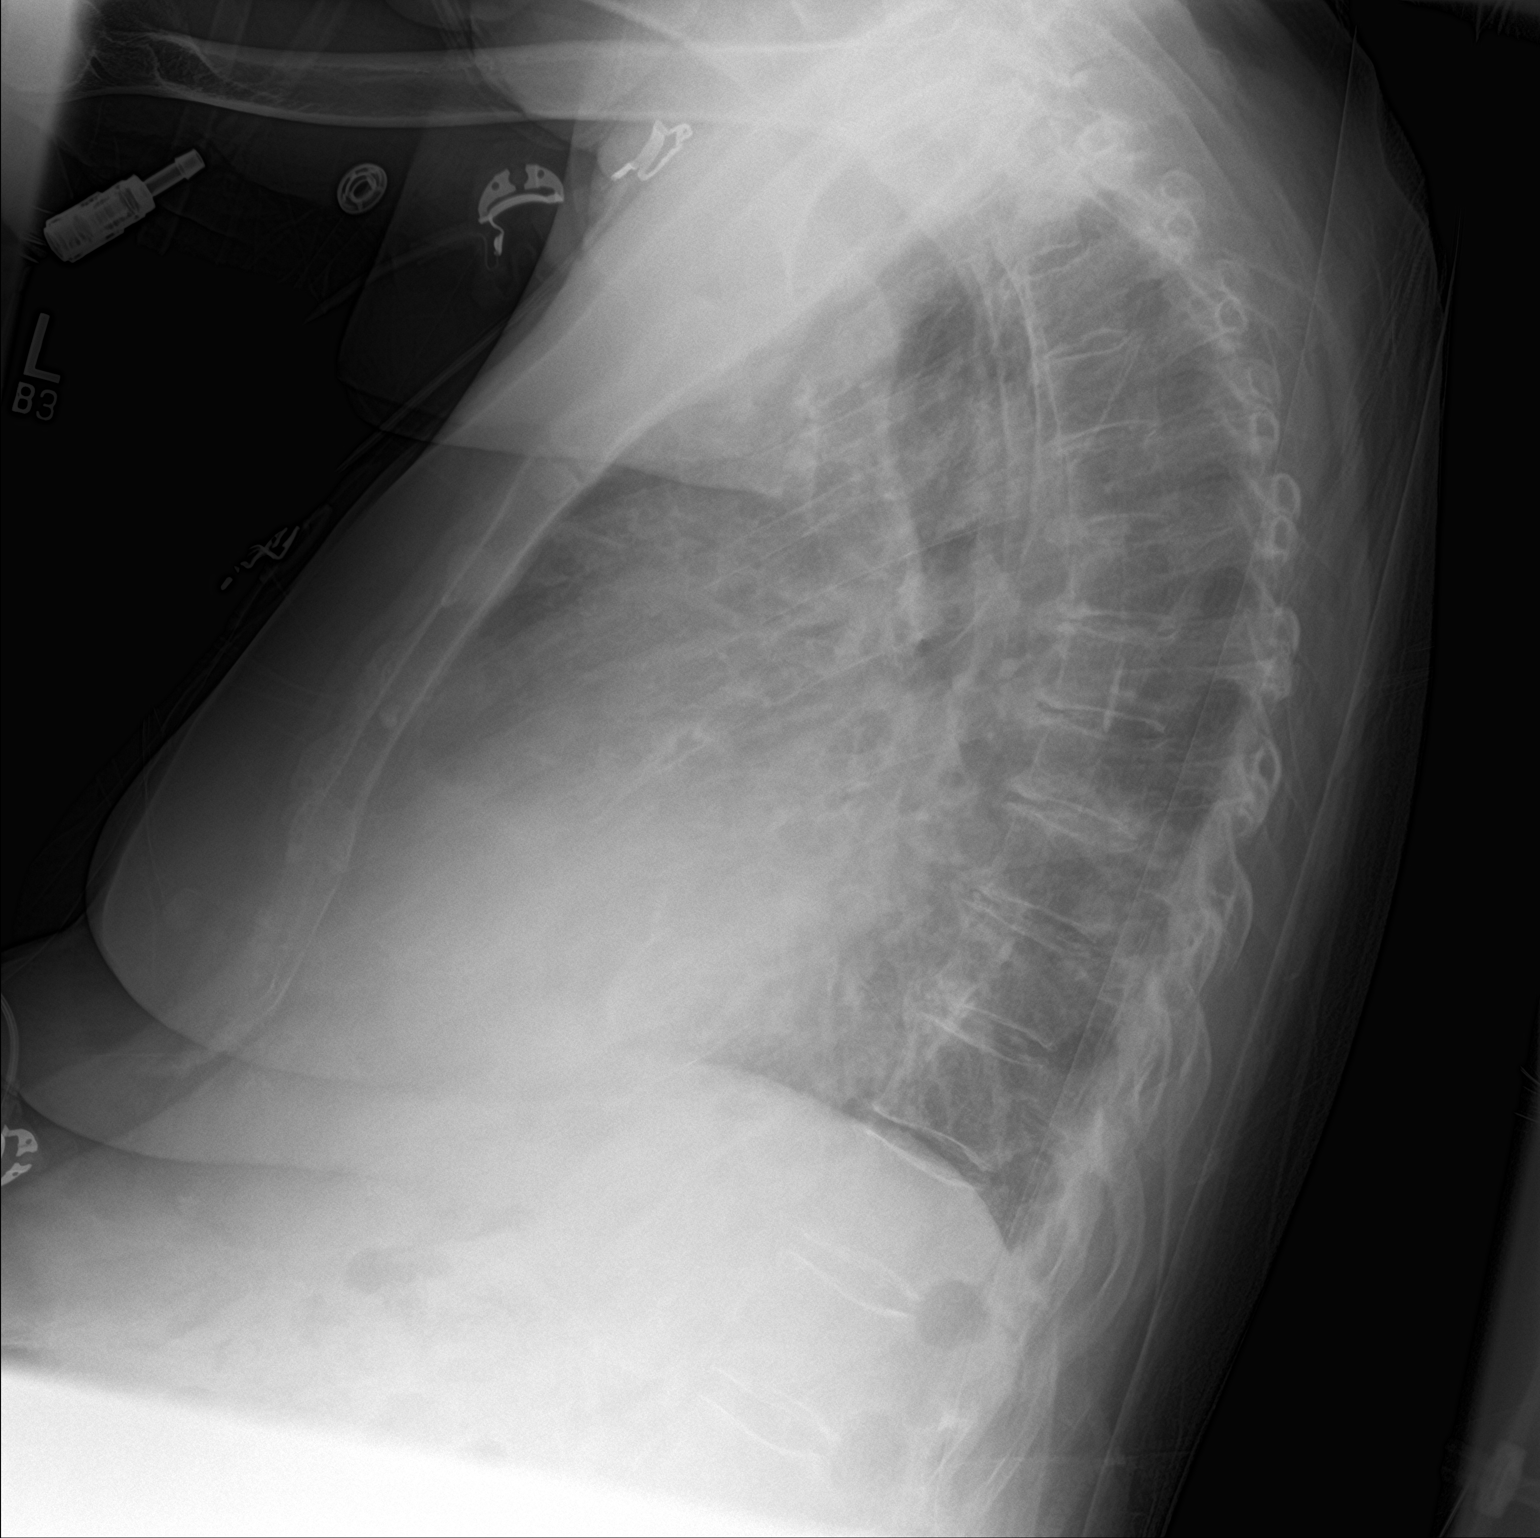

[chest ap]
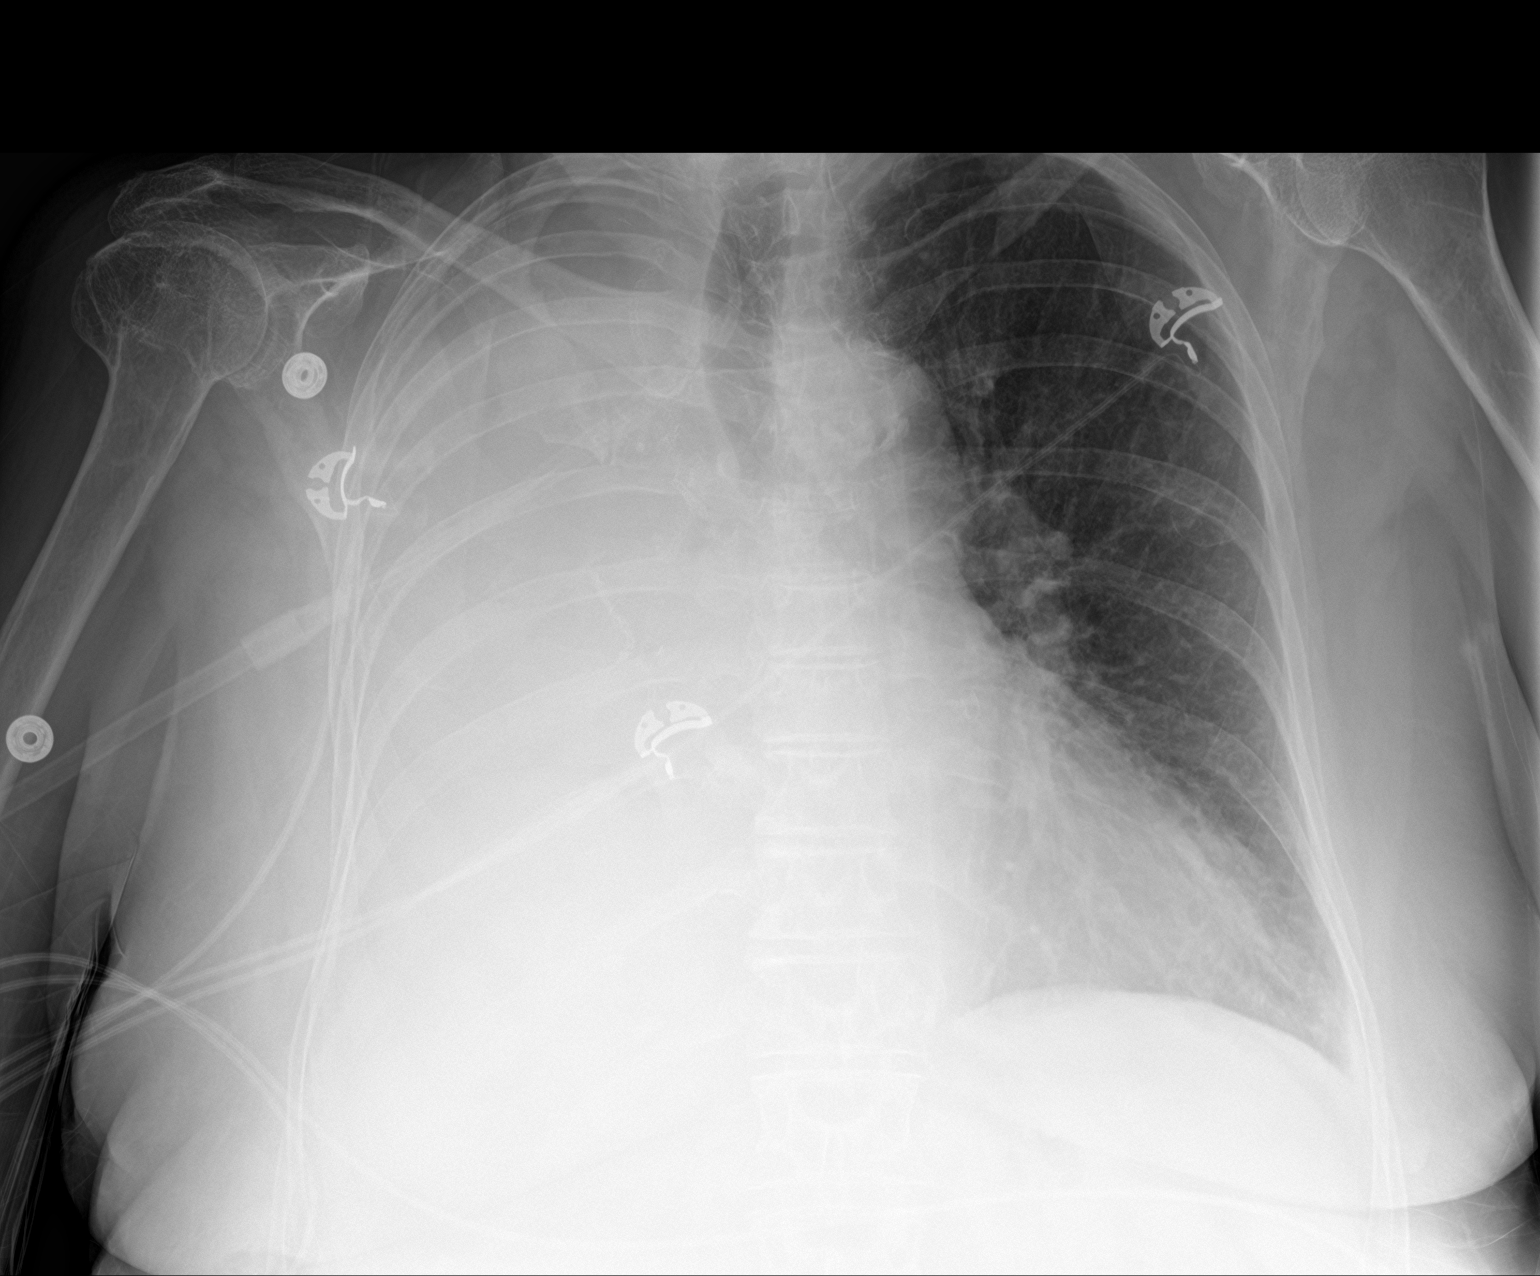

[2 of 2 positions shown; findings below may reference images not displayed]

FINDINGS: Complete opacification of the right hemithorax. Left lung is clear.
No pneumothorax is seen.

The heart is normal in size.  No cardiomediastinal shift.

Mild degenerative changes of the visualized thoracolumbar spine.
IMPRESSION: Complete opacification of the right hemithorax.

## 2020-02-08 ENCOUNTER — Encounter: Payer: Medicare HMO | Admitting: Thoracic Surgery (Cardiothoracic Vascular Surgery)
